# Patient Record
Sex: Female | Born: 1970 | Race: White | Hispanic: No | Marital: Married | Smoking: Current every day smoker
Health system: Southern US, Community
[De-identification: ages and names within clinical notes are randomized; demographics above are authoritative.]

## PROBLEM LIST (undated history)

## (undated) DIAGNOSIS — K529 Noninfective gastroenteritis and colitis, unspecified: Secondary | ICD-10-CM

## (undated) DIAGNOSIS — N281 Cyst of kidney, acquired: Secondary | ICD-10-CM

## (undated) DIAGNOSIS — Z9981 Dependence on supplemental oxygen: Secondary | ICD-10-CM

## (undated) DIAGNOSIS — N2889 Other specified disorders of kidney and ureter: Secondary | ICD-10-CM

## (undated) DIAGNOSIS — R0600 Dyspnea, unspecified: Secondary | ICD-10-CM

## (undated) DIAGNOSIS — T560X1A Toxic effect of lead and its compounds, accidental (unintentional), initial encounter: Secondary | ICD-10-CM

## (undated) DIAGNOSIS — Z9889 Other specified postprocedural states: Secondary | ICD-10-CM

## (undated) DIAGNOSIS — M549 Dorsalgia, unspecified: Secondary | ICD-10-CM

## (undated) DIAGNOSIS — R112 Nausea with vomiting, unspecified: Secondary | ICD-10-CM

## (undated) DIAGNOSIS — K5900 Constipation, unspecified: Secondary | ICD-10-CM

## (undated) DIAGNOSIS — F419 Anxiety disorder, unspecified: Secondary | ICD-10-CM

## (undated) DIAGNOSIS — K219 Gastro-esophageal reflux disease without esophagitis: Secondary | ICD-10-CM

## (undated) DIAGNOSIS — J189 Pneumonia, unspecified organism: Secondary | ICD-10-CM

## (undated) DIAGNOSIS — G8929 Other chronic pain: Secondary | ICD-10-CM

## (undated) DIAGNOSIS — I10 Essential (primary) hypertension: Secondary | ICD-10-CM

## (undated) DIAGNOSIS — G4700 Insomnia, unspecified: Secondary | ICD-10-CM

## (undated) DIAGNOSIS — M1990 Unspecified osteoarthritis, unspecified site: Secondary | ICD-10-CM

## (undated) DIAGNOSIS — C55 Malignant neoplasm of uterus, part unspecified: Secondary | ICD-10-CM

## (undated) DIAGNOSIS — G43909 Migraine, unspecified, not intractable, without status migrainosus: Secondary | ICD-10-CM

## (undated) DIAGNOSIS — I509 Heart failure, unspecified: Secondary | ICD-10-CM

## (undated) DIAGNOSIS — F329 Major depressive disorder, single episode, unspecified: Secondary | ICD-10-CM

## (undated) DIAGNOSIS — F4310 Post-traumatic stress disorder, unspecified: Secondary | ICD-10-CM

## (undated) HISTORY — PX: HAND SURGERY: SHX662

## (undated) HISTORY — PX: ABDOMINAL HYSTERECTOMY: SHX81

## (undated) HISTORY — PX: CHOLECYSTECTOMY: SHX55

## (undated) HISTORY — PX: BACK SURGERY: SHX140

## (undated) HISTORY — DX: Other chronic pain: G89.29

## (undated) HISTORY — DX: Constipation, unspecified: K59.00

## (undated) HISTORY — DX: Toxic effect of lead and its compounds, accidental (unintentional), initial encounter: T56.0X1A

## (undated) HISTORY — DX: Gastro-esophageal reflux disease without esophagitis: K21.9

## (undated) HISTORY — DX: Malignant neoplasm of uterus, part unspecified: C55

## (undated) HISTORY — DX: Other specified disorders of kidney and ureter: N28.89

---

## 2003-10-03 ENCOUNTER — Emergency Department (HOSPITAL_COMMUNITY): Admission: EM | Admit: 2003-10-03 | Discharge: 2003-10-04 | Payer: Self-pay | Admitting: *Deleted

## 2004-02-14 ENCOUNTER — Emergency Department (HOSPITAL_COMMUNITY): Admission: EM | Admit: 2004-02-14 | Discharge: 2004-02-14 | Payer: Self-pay | Admitting: *Deleted

## 2004-02-14 IMAGING — CR DG ABDOMEN ACUTE W/ 1V CHEST
3 series · 3 of 3 positions shown · non-contrast
Comparison: none

CLINICAL DATA: Abdominal pain. 
 ACUTE ABDOMEN SERIES WITH CHEST ? [DATE]
 The lungs are clear.  The cardiac and mediastinal contours are normal.   Nonobstructive bowel gas pattern.  Extensive inspissated fecal material seen within the colon.  I suspect this reflects an element of constipation.
 IMPRESSION
 1.  Nonobstructive bowel gas pattern with probable element of constipation.
 2.  Lungs clear.

[view not recorded (1 of 3)]
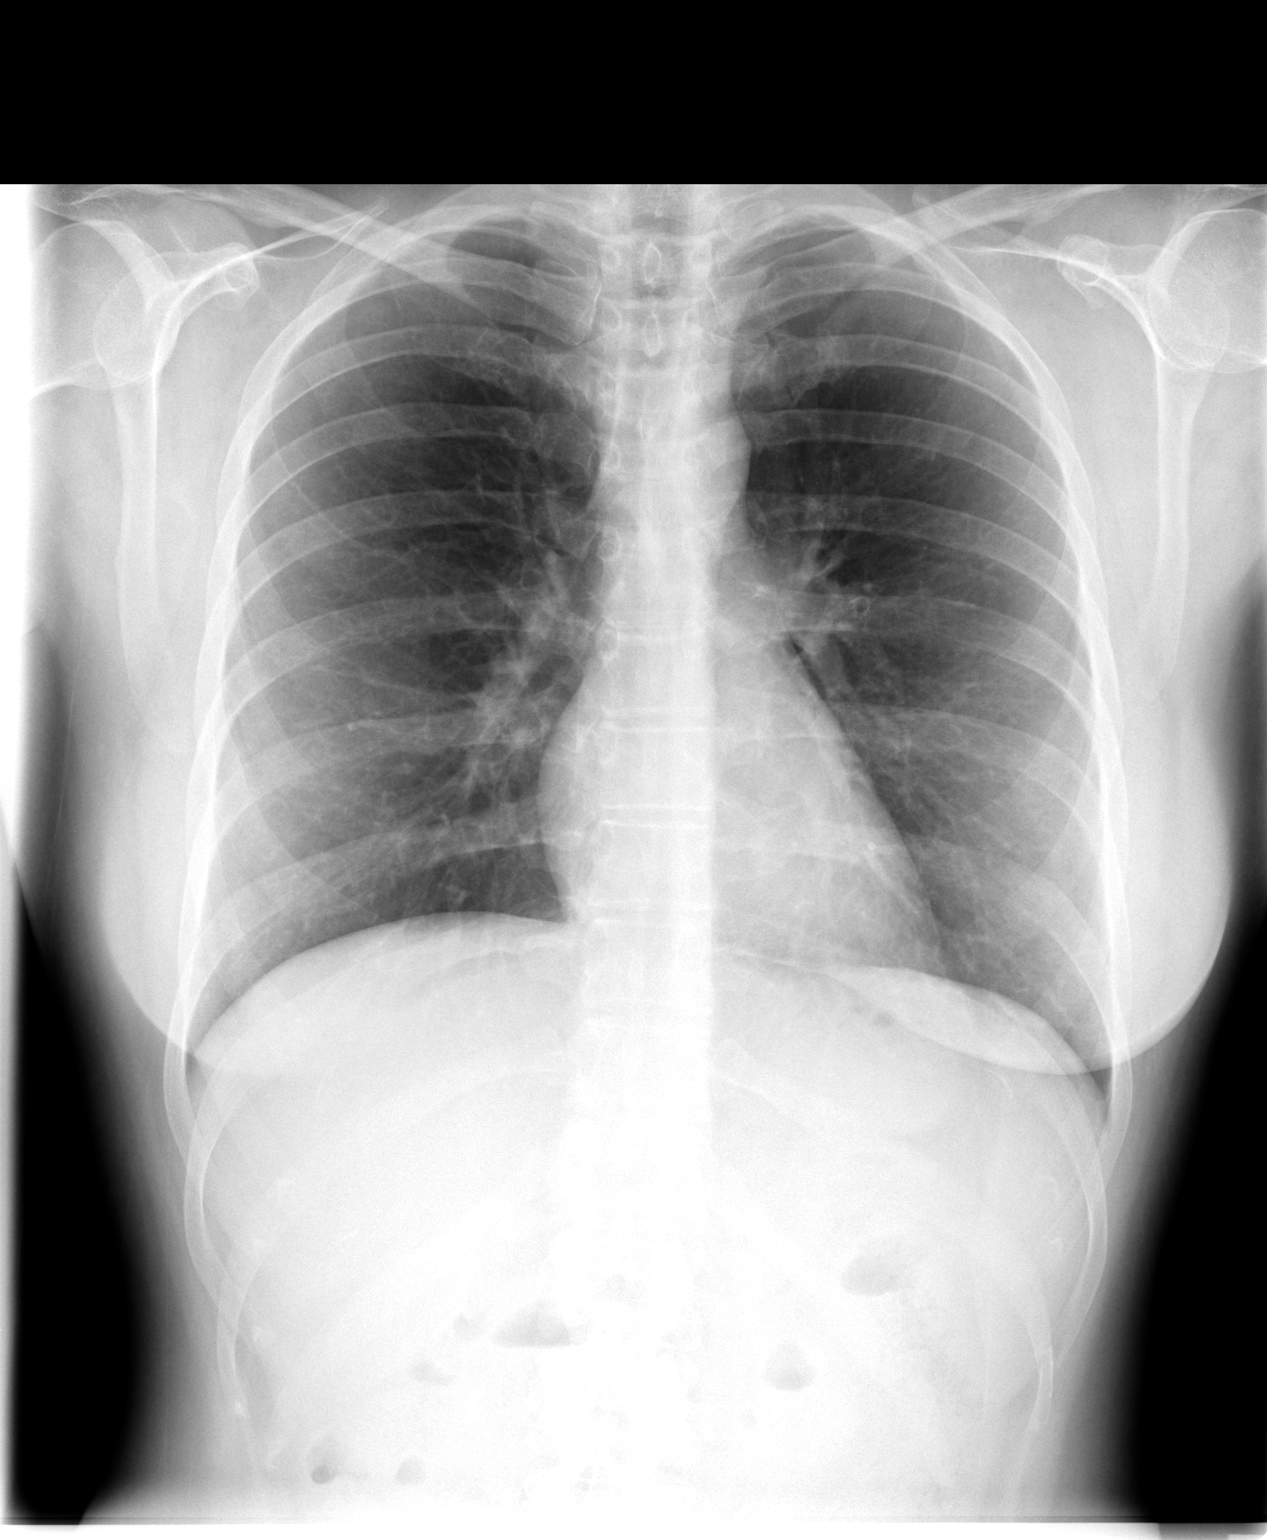

[view not recorded (2 of 3)]
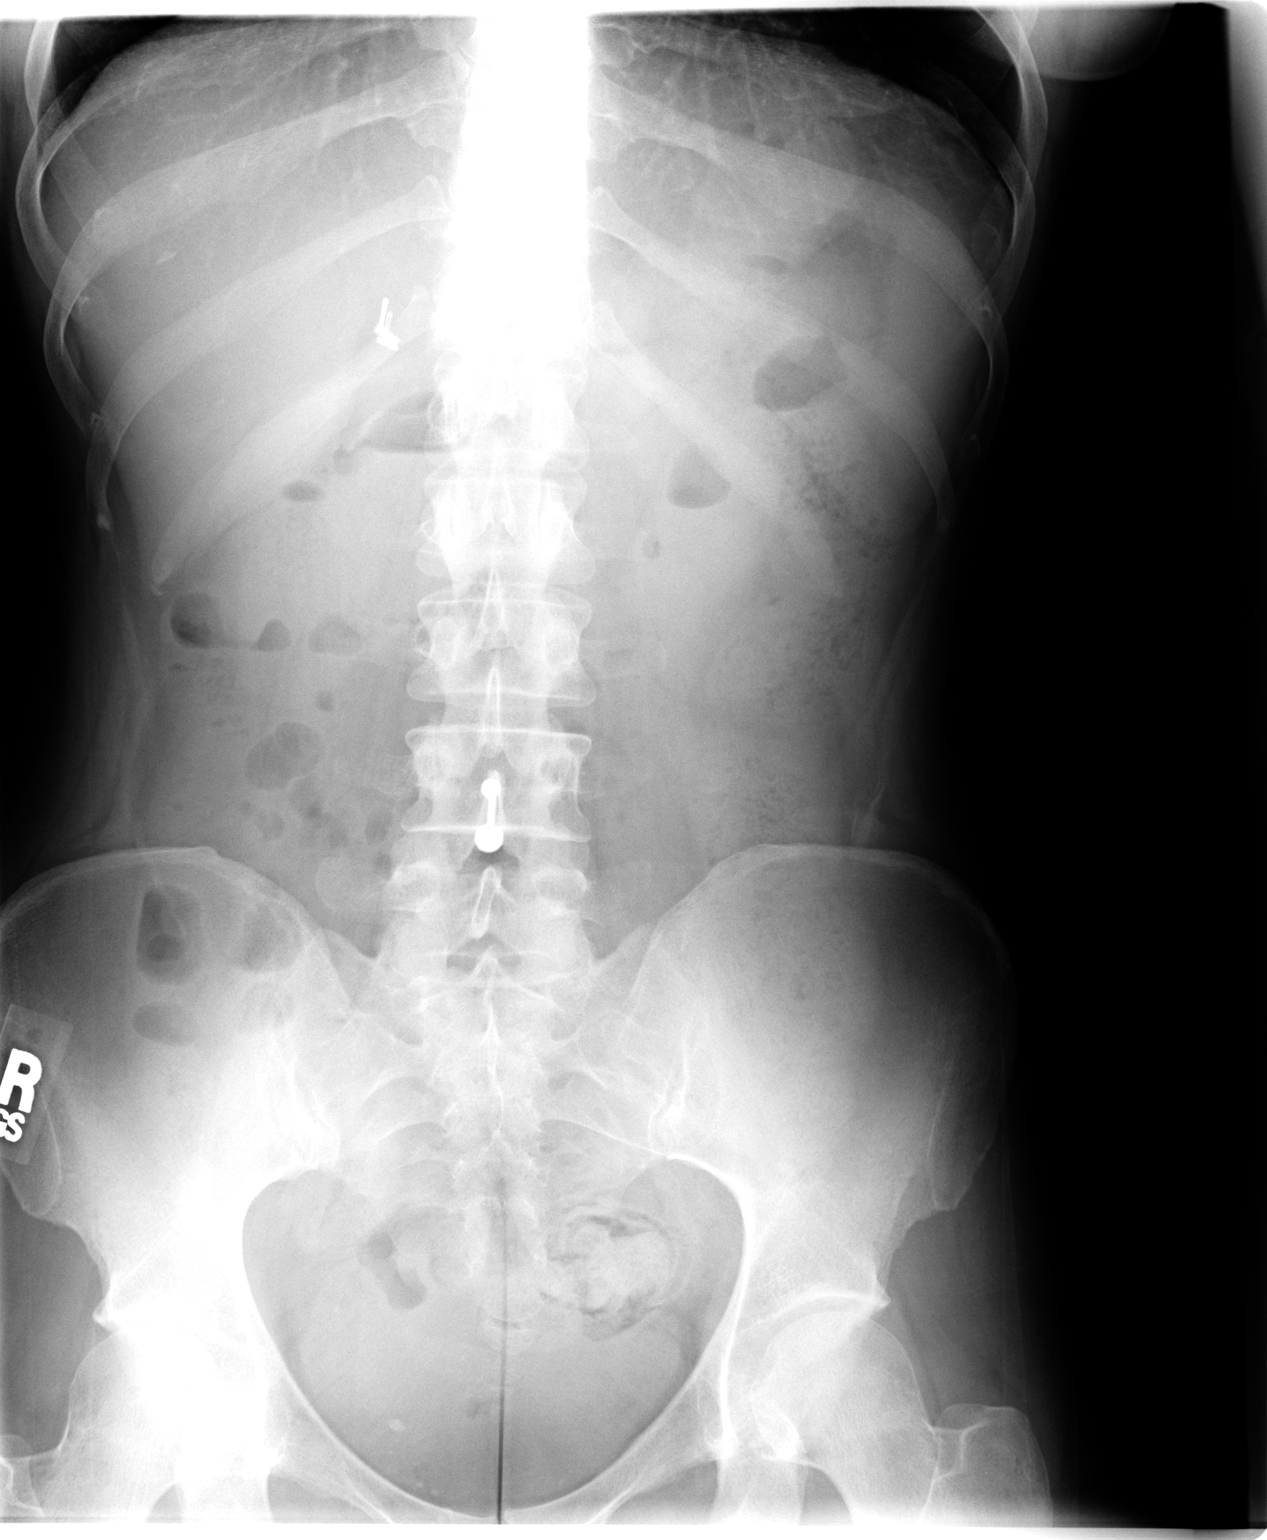

[view not recorded (3 of 3)]
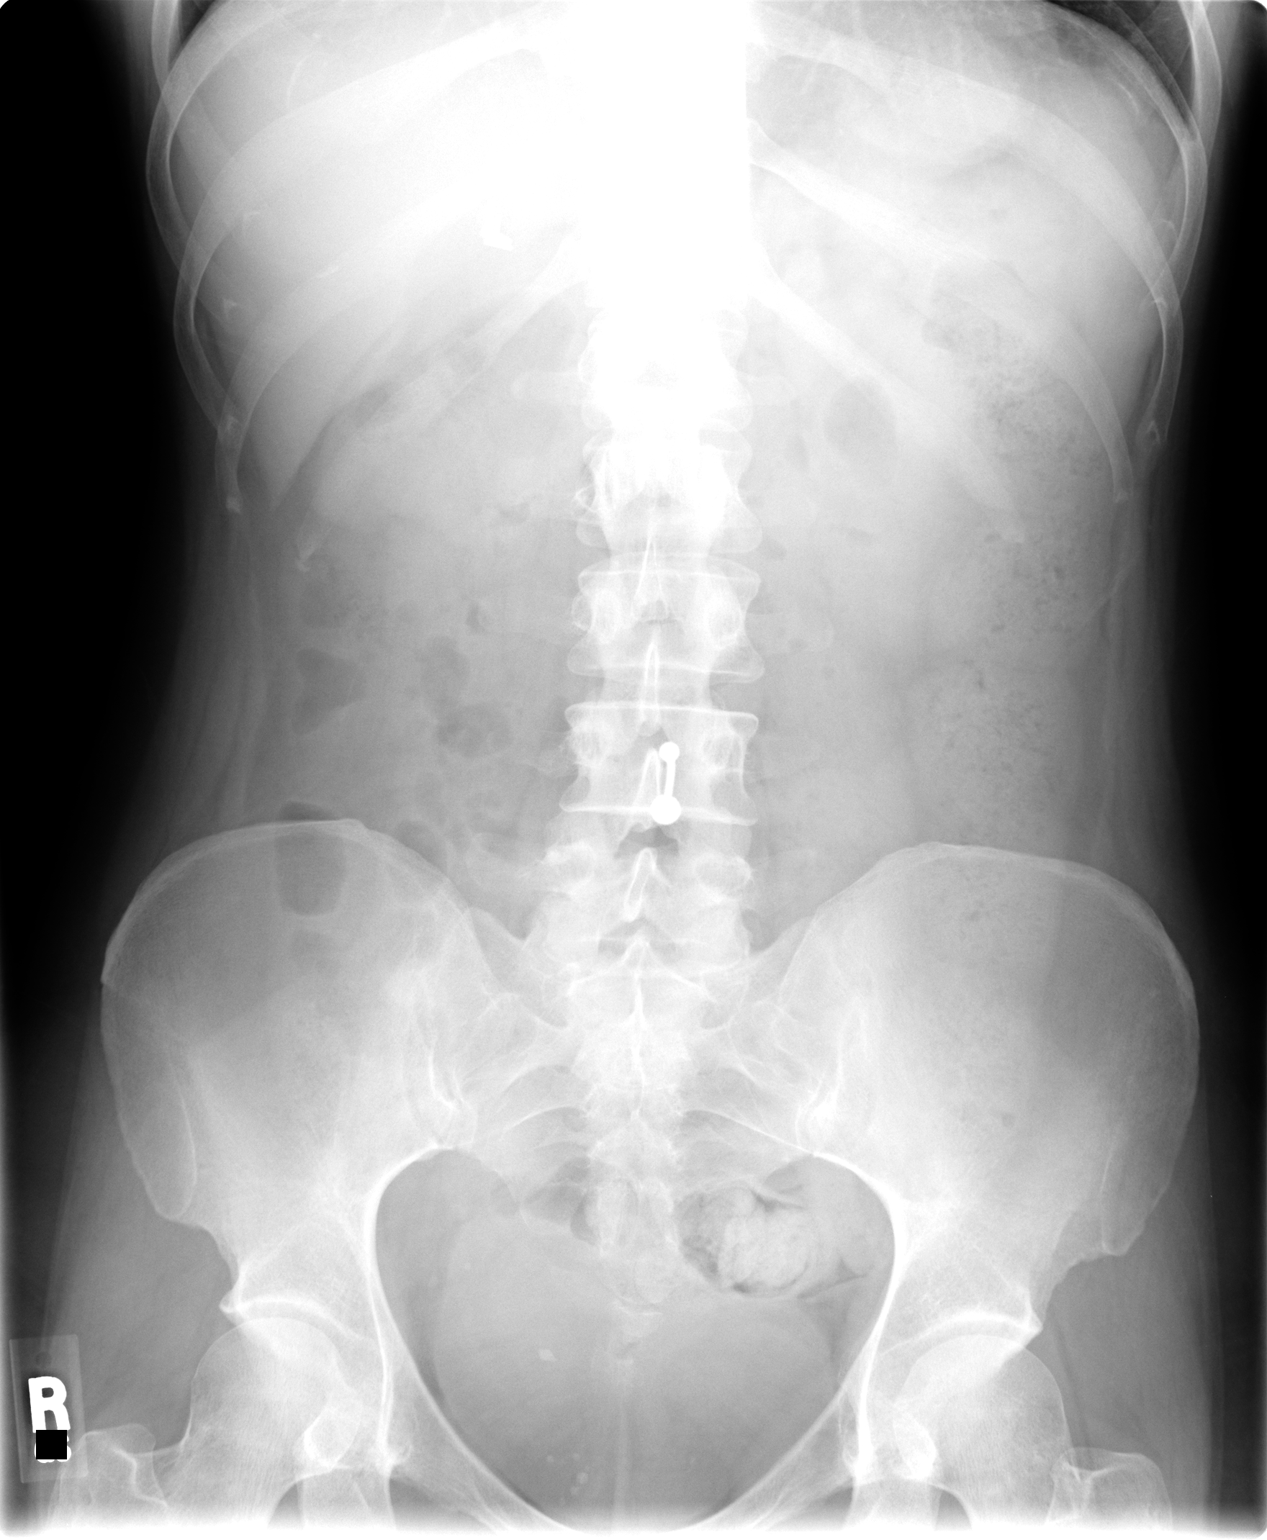

[3 of 3 positions shown; findings below may reference images not displayed]

## 2004-07-24 ENCOUNTER — Observation Stay (HOSPITAL_COMMUNITY): Admission: EM | Admit: 2004-07-24 | Discharge: 2004-07-25 | Payer: Self-pay | Admitting: *Deleted

## 2004-07-24 IMAGING — CR DG ABDOMEN ACUTE W/ 1V CHEST
3 series · 3 of 3 positions shown · non-contrast
Comparison: [DATE].

CLINICAL DATA: Abdominal pain.   Vomiting.
THREE-VIEW ACUTE ABDOMEN SERIES WITH CHEST:

[view not recorded (1 of 3)]
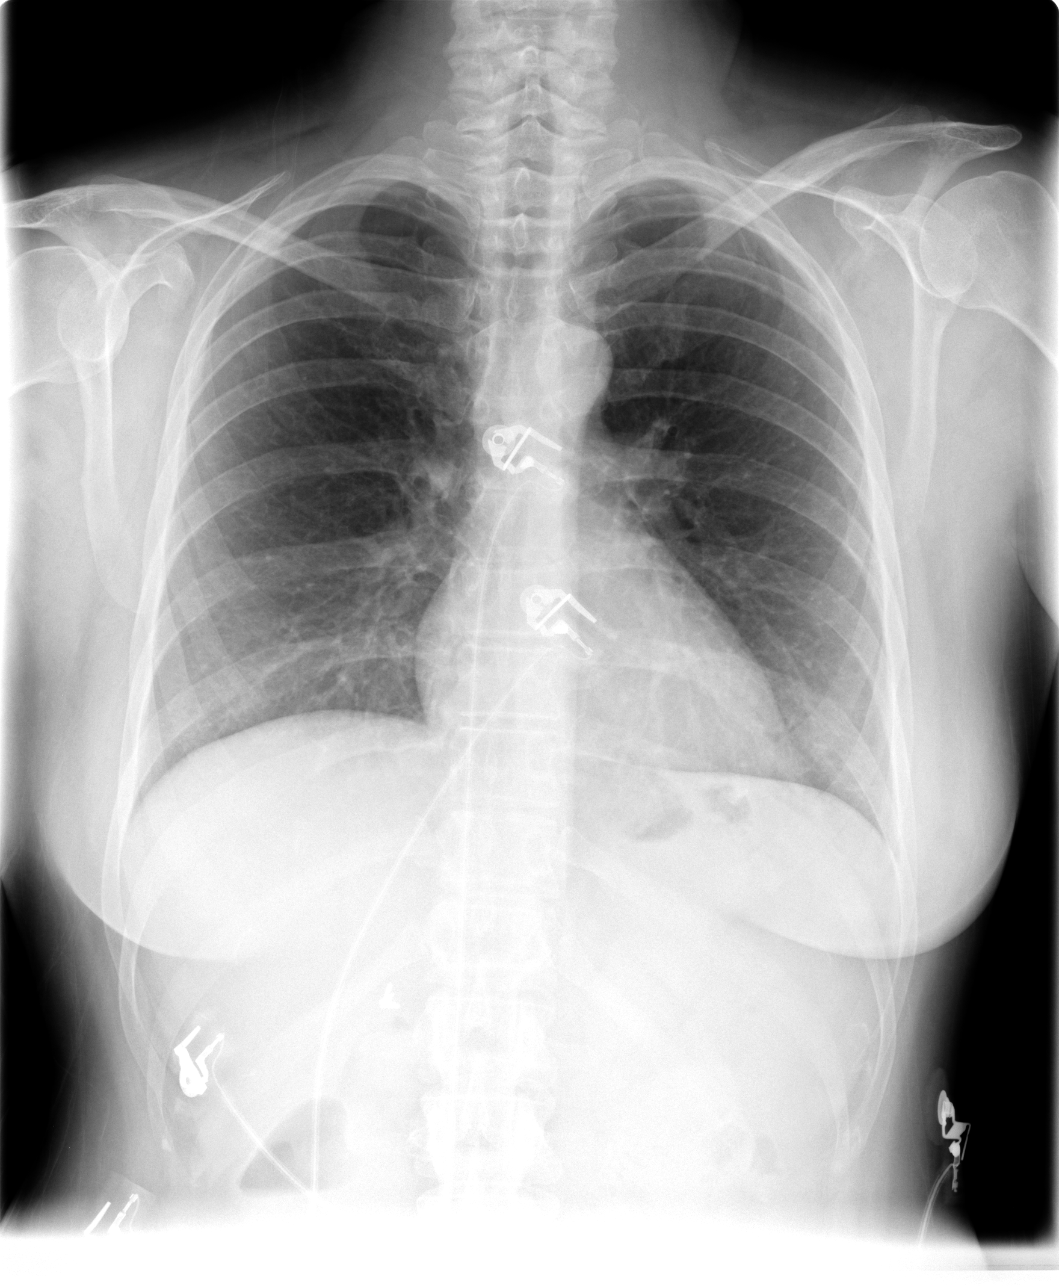

[view not recorded (2 of 3)]
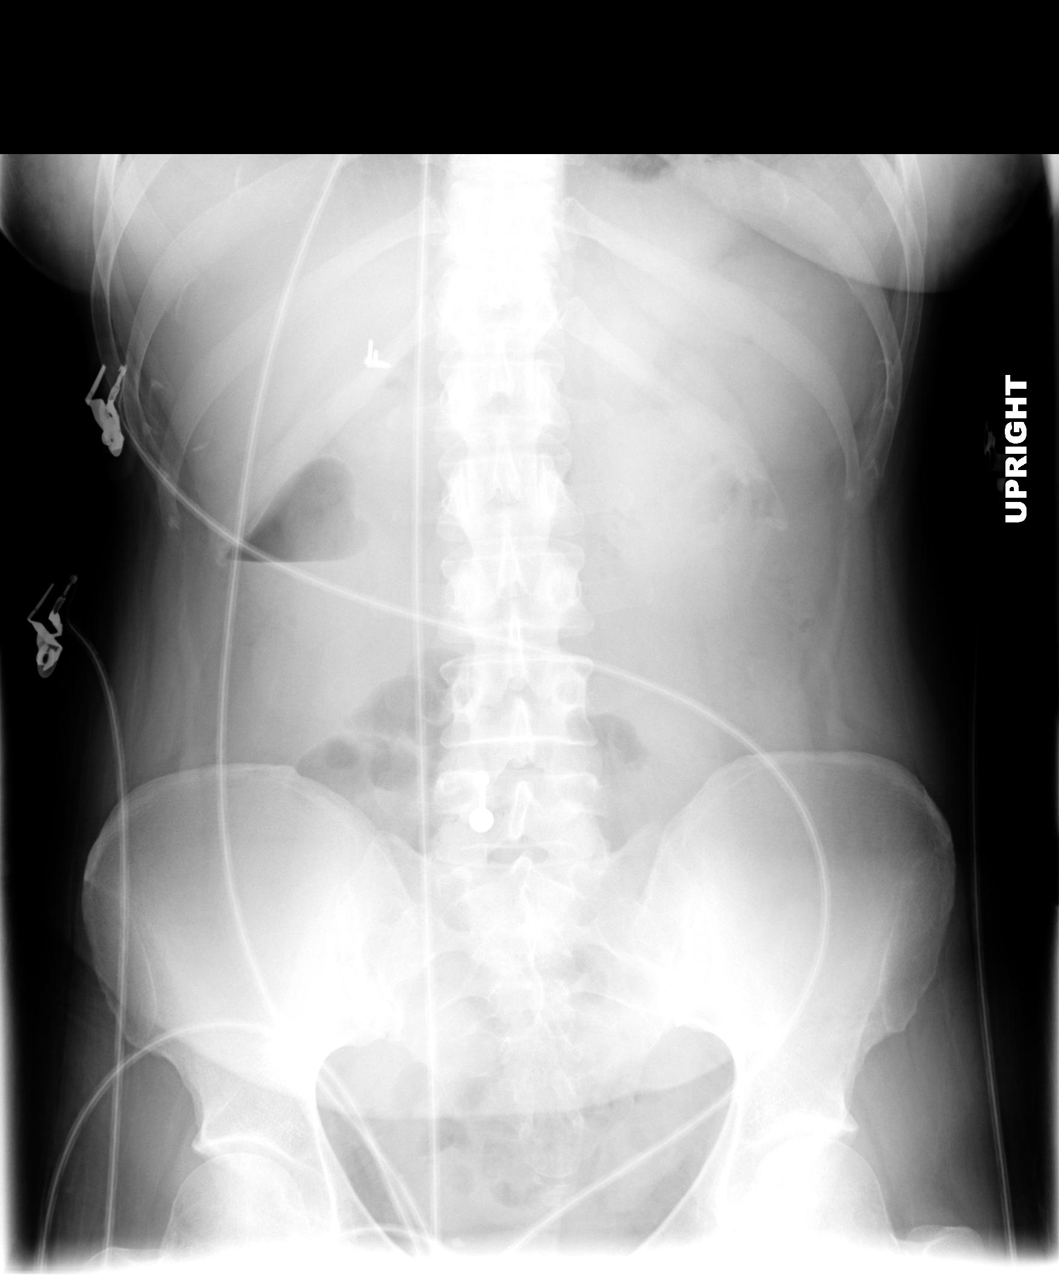

[view not recorded (3 of 3)]
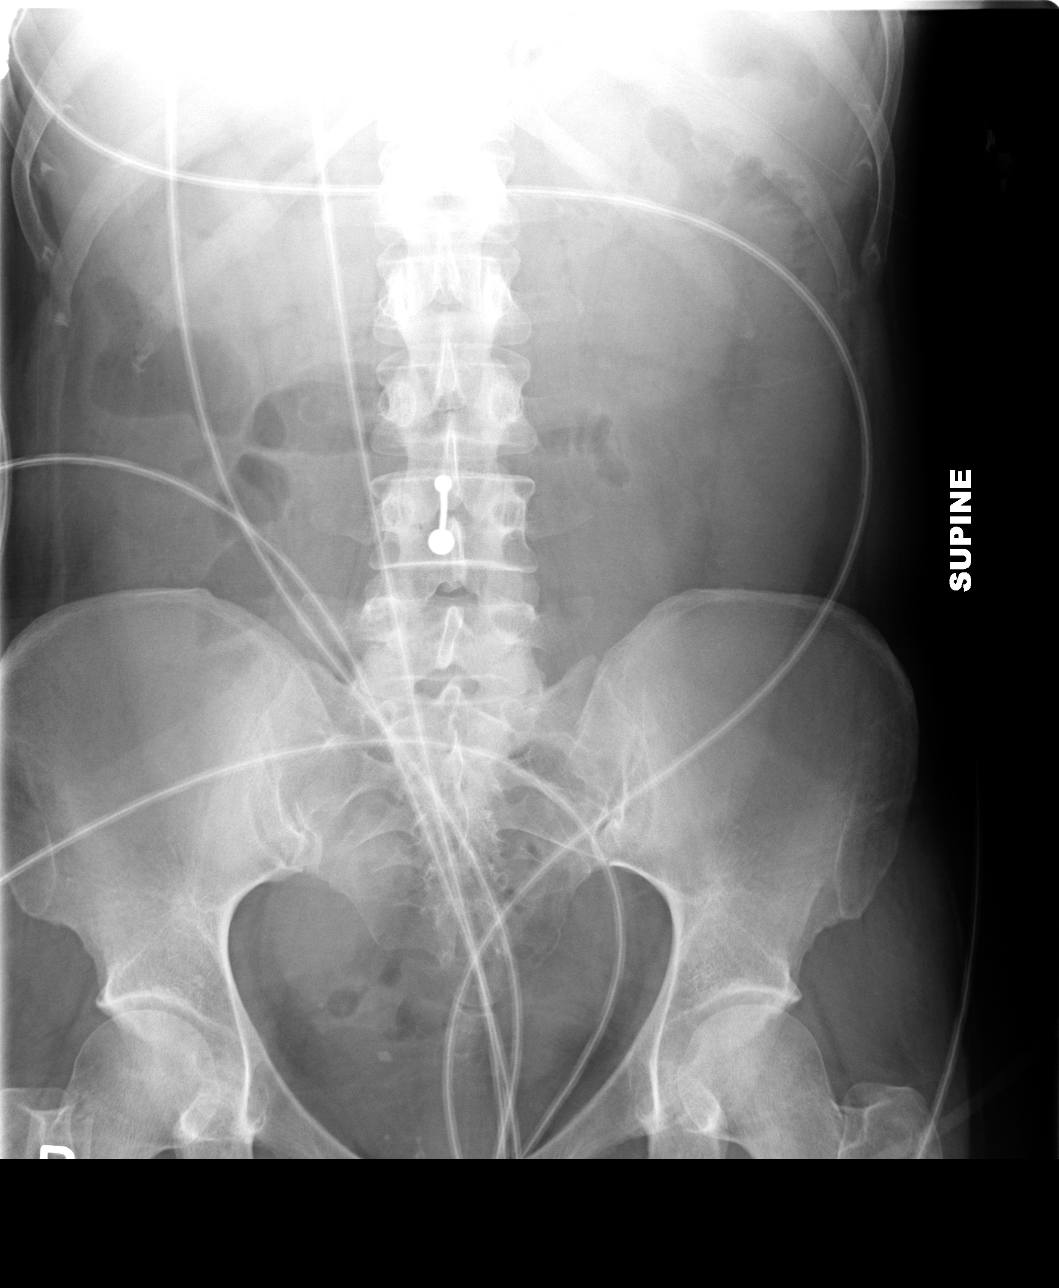

[3 of 3 positions shown; findings below may reference images not displayed]

Mild peribronchial thickening is stable.  The cardiomediastinal silhouette is unchanged.  Evidence of cholecystectomy noted.  The bowel gas pattern is nonspecific without evidence of bowel obstruction or pneumoperitoneum.  The bony structures are unremarkable.
IMPRESSION: 1.  Nonspecific bowel gas pattern without obstruction or pneumoperitoneum.  
2.  Stable mild peribronchial thickening.

## 2004-07-25 IMAGING — CT CT ABDOMEN W/ CM
1 of 3 series · 14 of 32 positions shown, 19 images · IV contrast (CONTRAST)
Comparison: None.

CLINICAL DATA: Midabdominal pain with nausea and vomiting.
TECHNIQUE: Routine multidetector helical imaging with oral and IV contrast - 100 cc Omnipaque 300.

[Series 2857: — · axial · 0.68mm/px · z∈[+1334,+1708]mm · 14 of 87 slices shown, 19 images]
[im 6/87  soft-tissue]
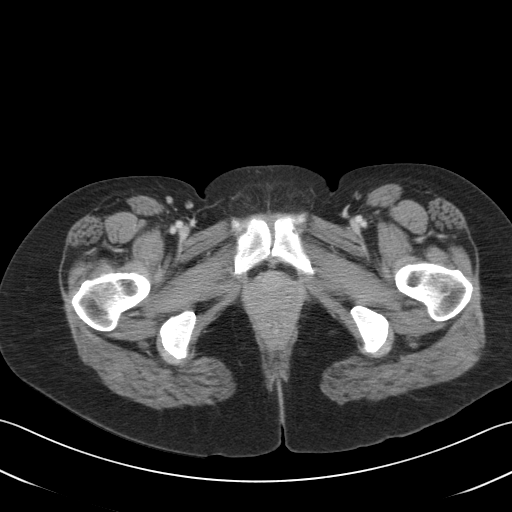
[im 6/87  bone]
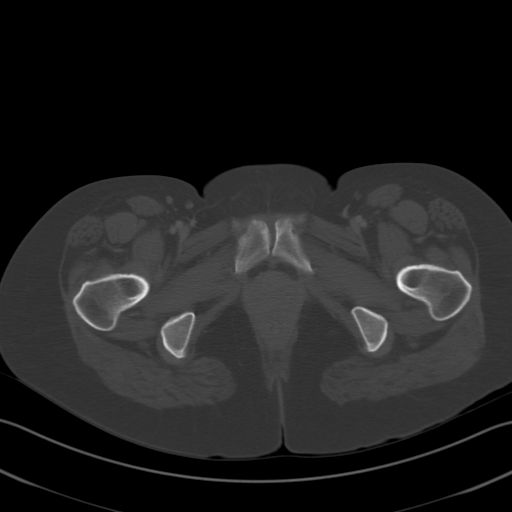
[im 11/87  soft-tissue]
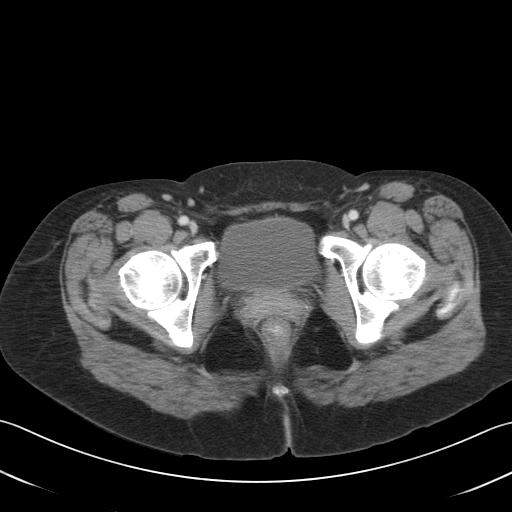
[im 21/87  soft-tissue]
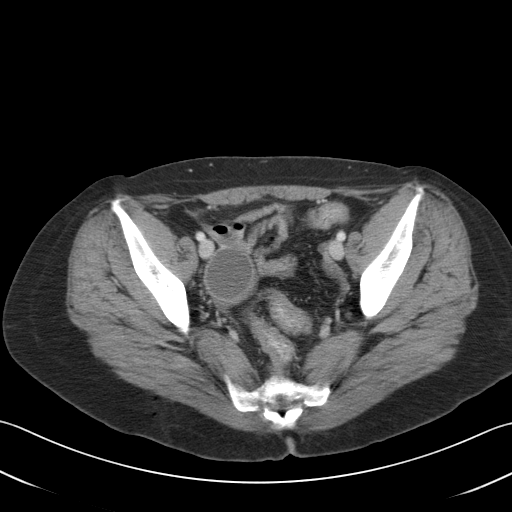
[im 26/87  soft-tissue]
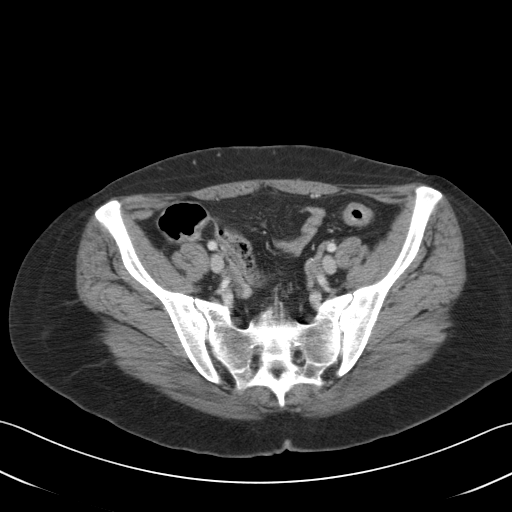
[im 31/87  soft-tissue]
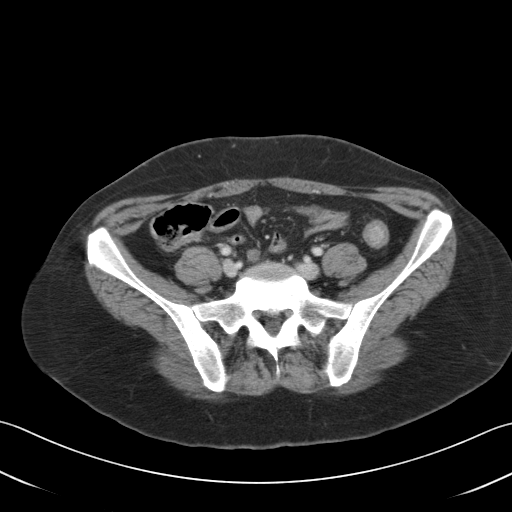
[im 36/87  soft-tissue]
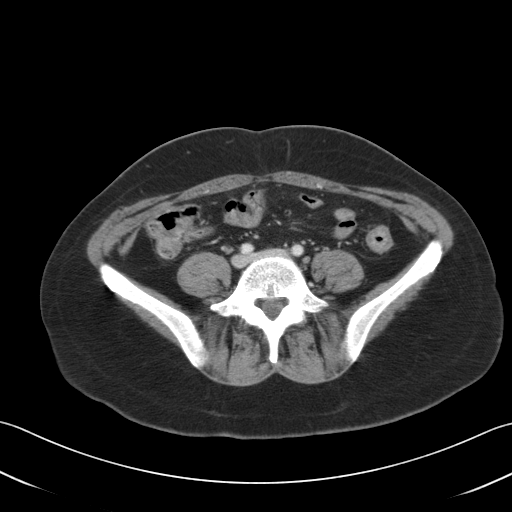
[im 46/87  soft-tissue]
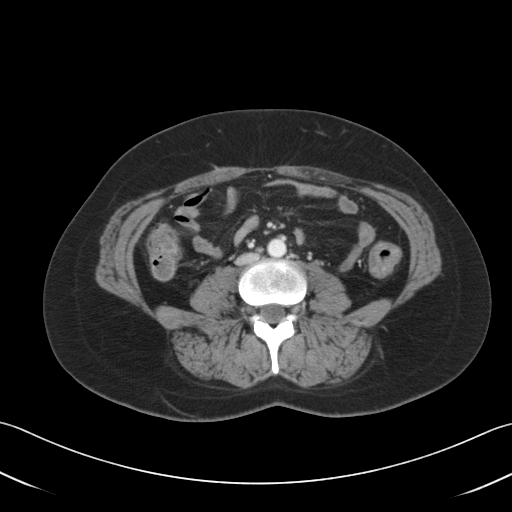
[im 51/87  soft-tissue]
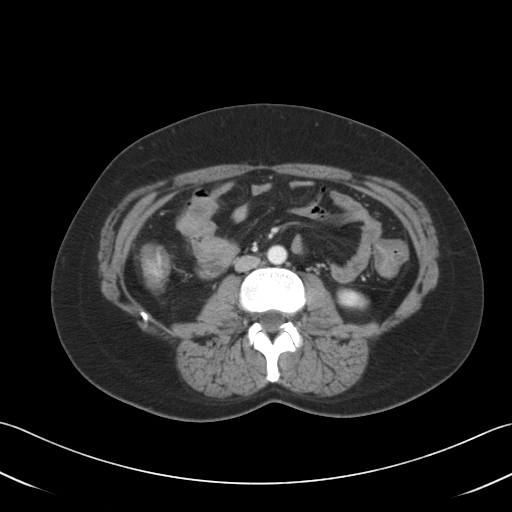
[im 56/87  soft-tissue]
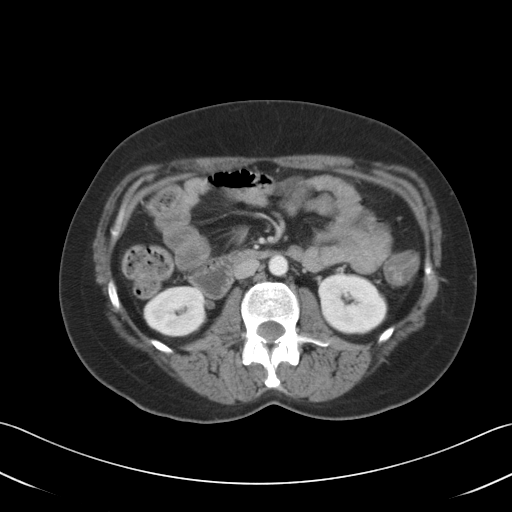
[im 56/87  bone]
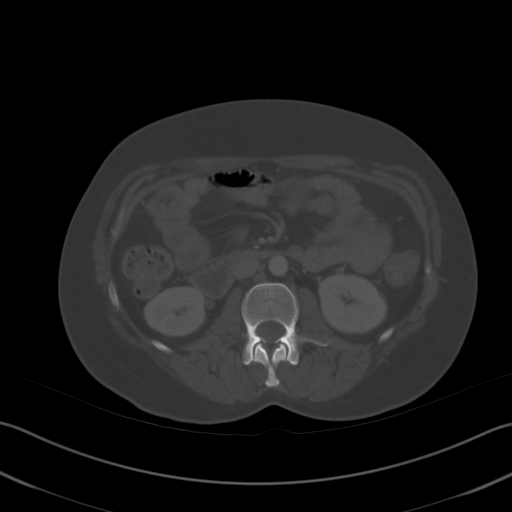
[im 61/87  soft-tissue]
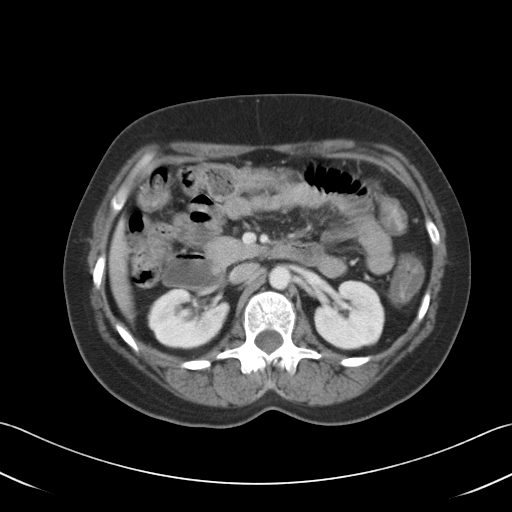
[im 66/87  soft-tissue]
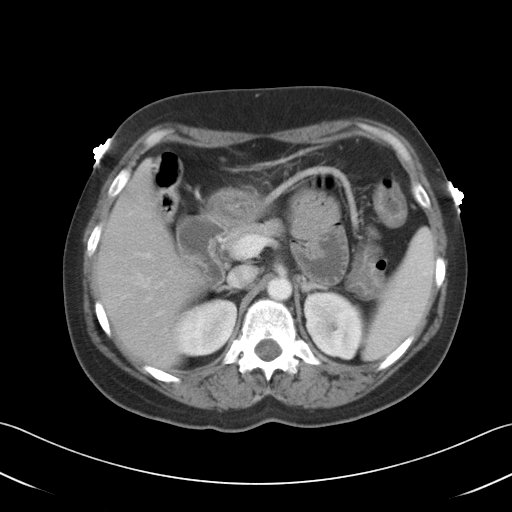
[im 66/87  lung]
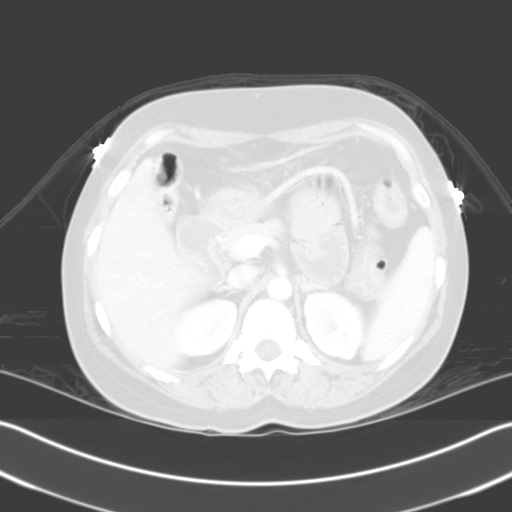
[im 71/87  lung]
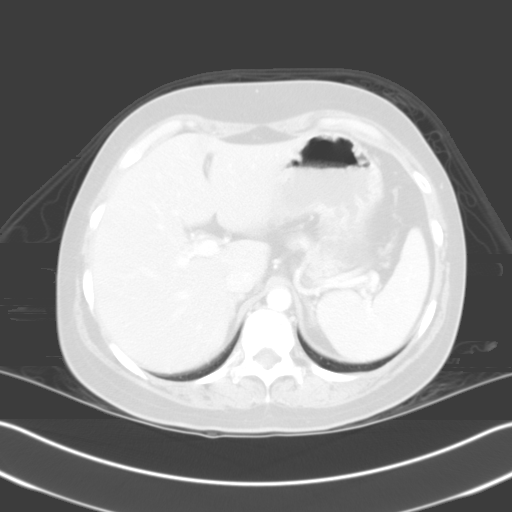
[im 76/87  soft-tissue]
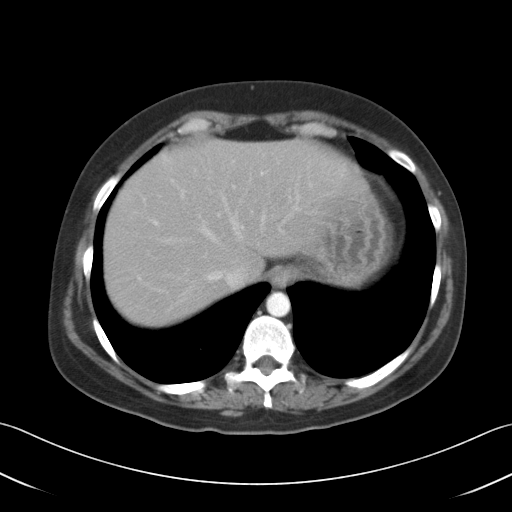
[im 76/87  lung]
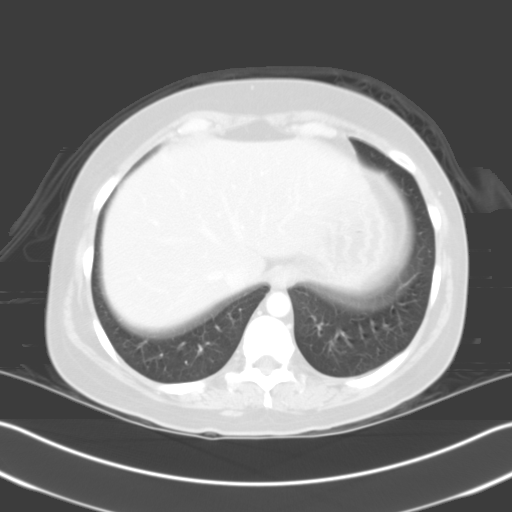
[im 81/87  soft-tissue]
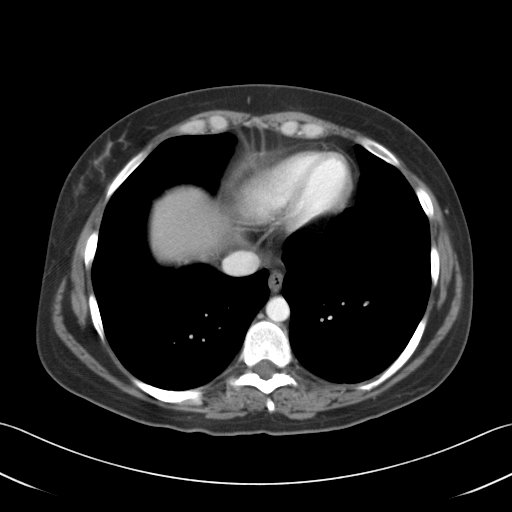
[im 81/87  lung]
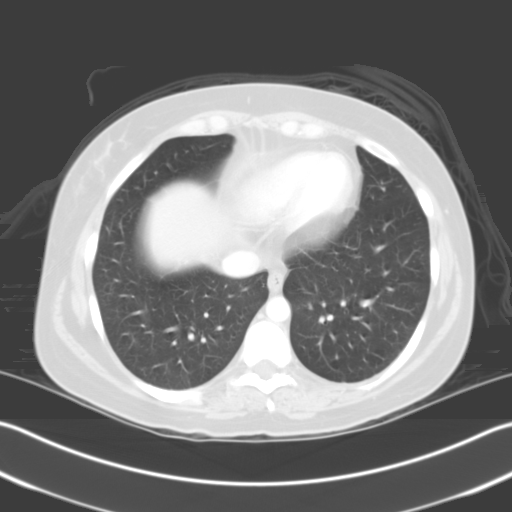

[14 of 32 positions shown; findings below may reference images not displayed]

CT ABDOMEN WITH CONTRAST:
 Lung bases are clear.  Liver, spleen, pancreas, and adrenals are normal.  Prior cholecystectomy.  No bile duct dilatation.  Mild dilatation of proximal small bowel loops without significant air fluid levels.  No adenopathy or ascites.
IMPRESSION: 1.  Mild dilatation of proximal small bowel loops without obstruction. 
 2.  No other acute or specific findings. 
 3.  Status post cholecystectomy. 
 CT PELVIS WITH CONTRAST:
 There is a 4-cm simple cyst in the right adnexa, most consistent with an ovarian cyst.  The patient appears to have had a partial hysterectomy.  No focal masses, adenopathy, or fluid collections.  Appendix normal.
IMPRESSION: Unremarkable, except for a 4-cm ovarian cyst.

## 2004-09-04 ENCOUNTER — Ambulatory Visit: Payer: Self-pay | Admitting: Internal Medicine

## 2004-09-15 ENCOUNTER — Ambulatory Visit (HOSPITAL_COMMUNITY): Admission: RE | Admit: 2004-09-15 | Discharge: 2004-09-15 | Payer: Self-pay | Admitting: Internal Medicine

## 2004-09-15 IMAGING — RF DG UGI W/ SMALL BOWEL HIGH DENSITY
14 of 24 series · 14 of 24 positions shown · non-contrast
Comparison: none

CLINICAL DATA: 33 year-old, nausea, vomiting, diarrhea.  Abdominal pain.
 UPPER GI AND SMALL BOWEL FOLLOW THROUGH:
 Single contrast upper GI study was performed with initial barium swallow demonstrating normal esophageal motility.  No intrinsic or extrinsic lesions of the esophagus and no mucosal abnormalities.  The mucosal folds appear normal.  There is a small sliding type hiatal hernia.  No gastroesophageal reflux is demonstrated.
 The stomach, duodenal bulb and C-loop are normal in appearance.

[Series 1: run · 1 of 1 slices shown (1 of 14)]
[im 1/1]
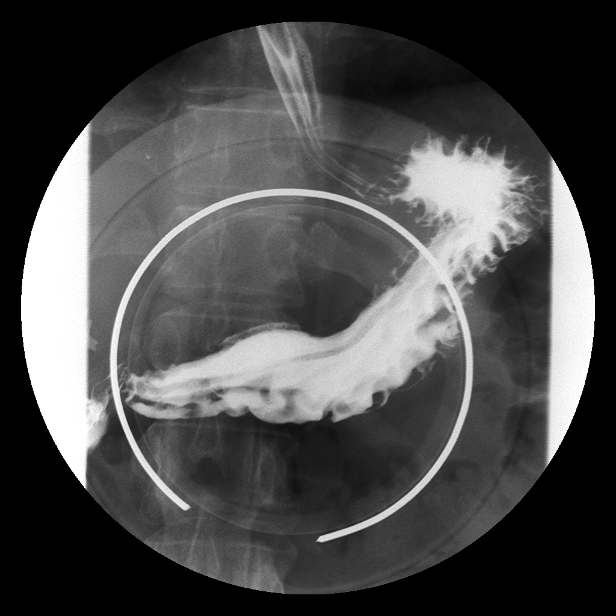

[Series 3: run · 1 of 1 slices shown (2 of 14)]
[im 1/1]
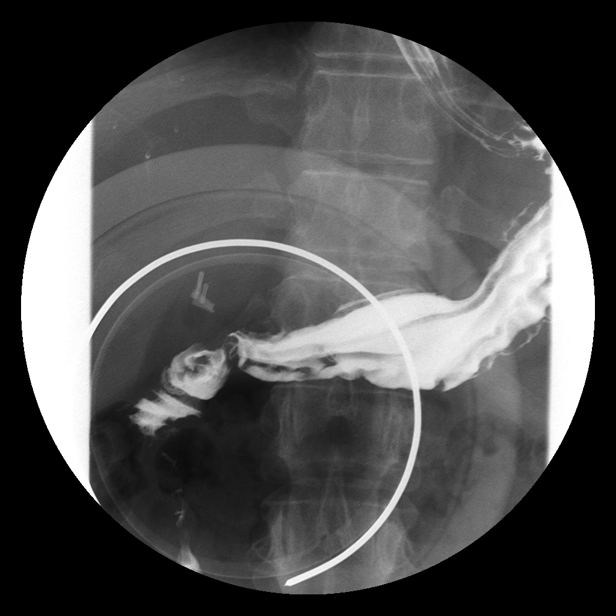

[Series 5: run · 1 of 1 slices shown (3 of 14)]
[im 1/1]
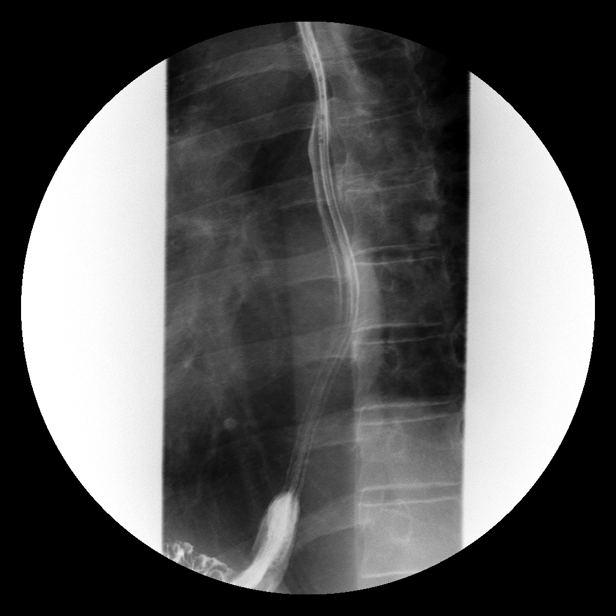

[Series 7: run · 1 of 1 slices shown (4 of 14)]
[im 1/1]
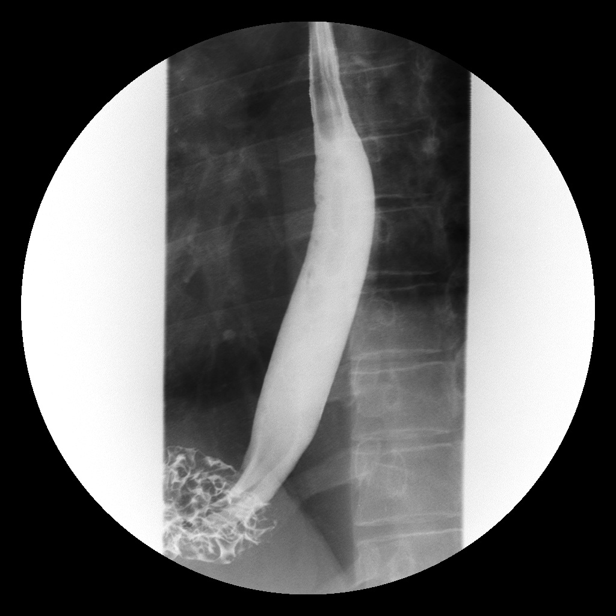

[Series 8: run · 1 of 1 slices shown (5 of 14)]
[im 1/1]
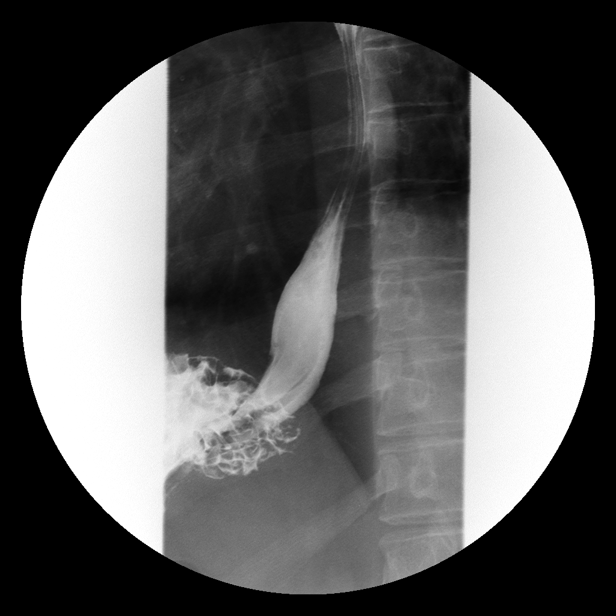

[Series 10: run · 1 of 1 slices shown (6 of 14)]
[im 1/1]
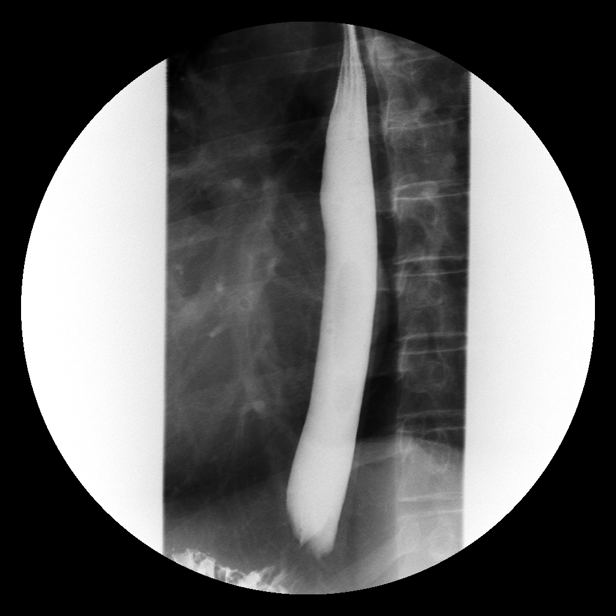

[Series 12: run · 1 of 1 slices shown (7 of 14)]
[im 1/1]
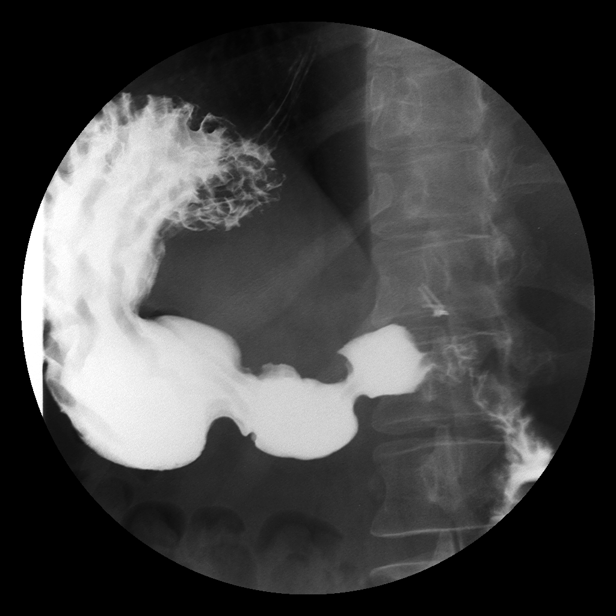

[Series 13: run · 1 of 1 slices shown (8 of 14)]
[im 1/1]
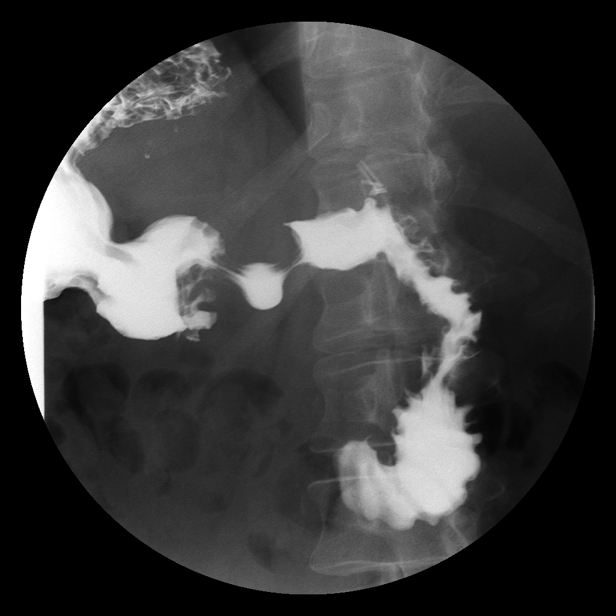

[Series 15: run · 1 of 1 slices shown (9 of 14)]
[im 1/1]
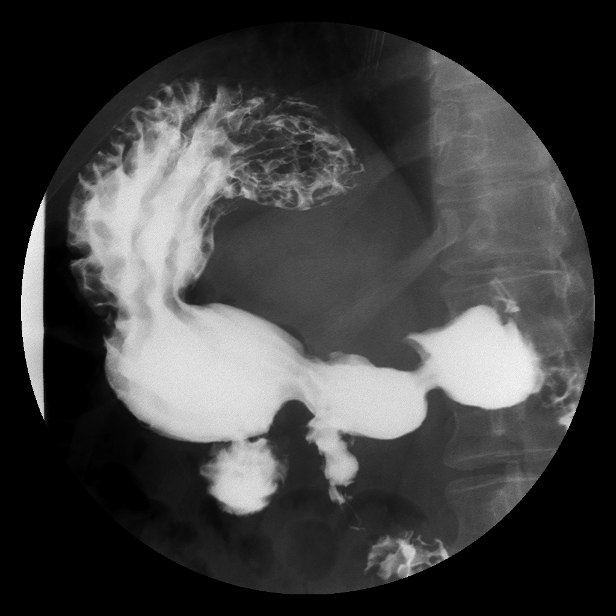

[Series 17: run · 1 of 1 slices shown (10 of 14)]
[im 1/1]
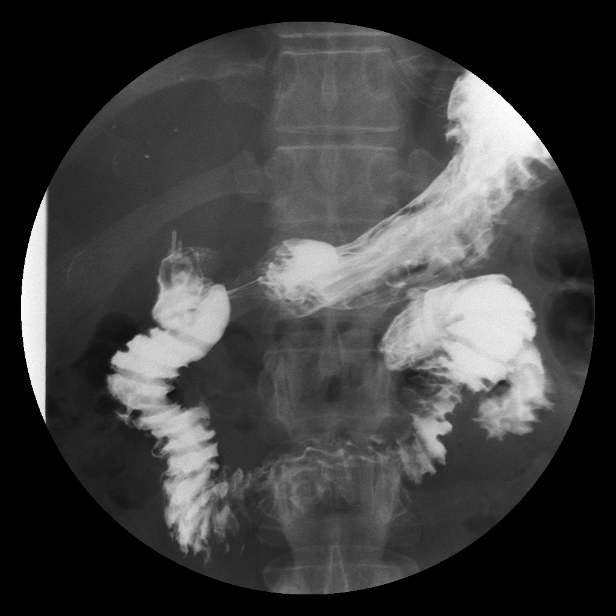

[Series 19: run · 1 of 1 slices shown (11 of 14)]
[im 1/1]
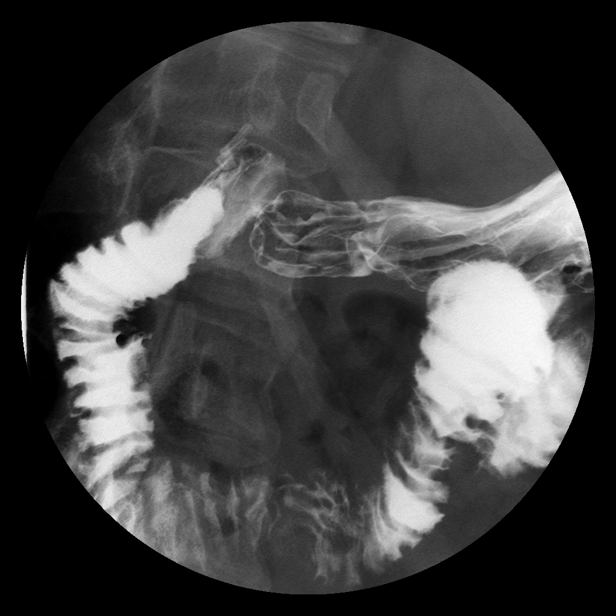

[Series 20: run · 1 of 1 slices shown (12 of 14)]
[im 1/1]
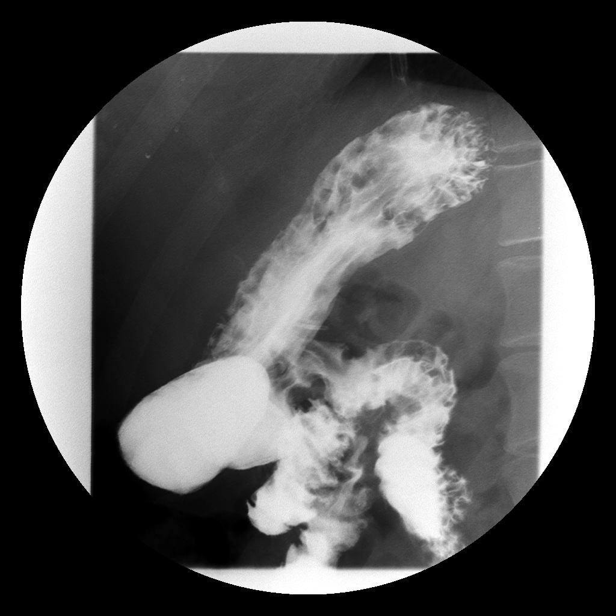

[Series 22: run · 1 of 1 slices shown (13 of 14)]
[im 1/1]
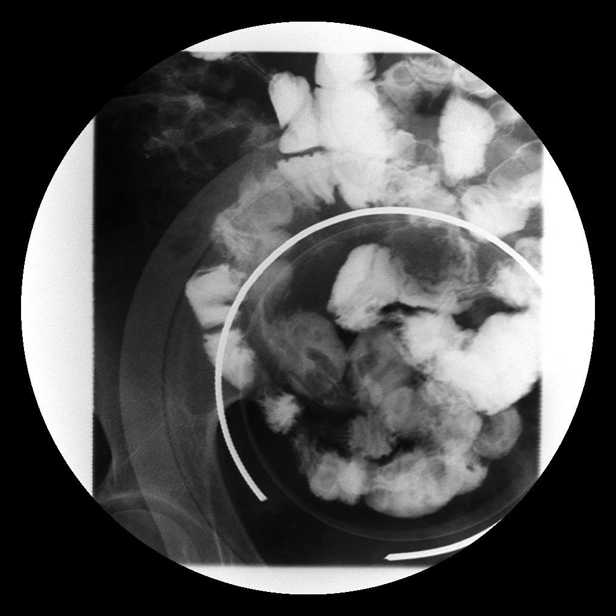

[Series 24: run · 1 of 1 slices shown (14 of 14)]
[im 1/1]
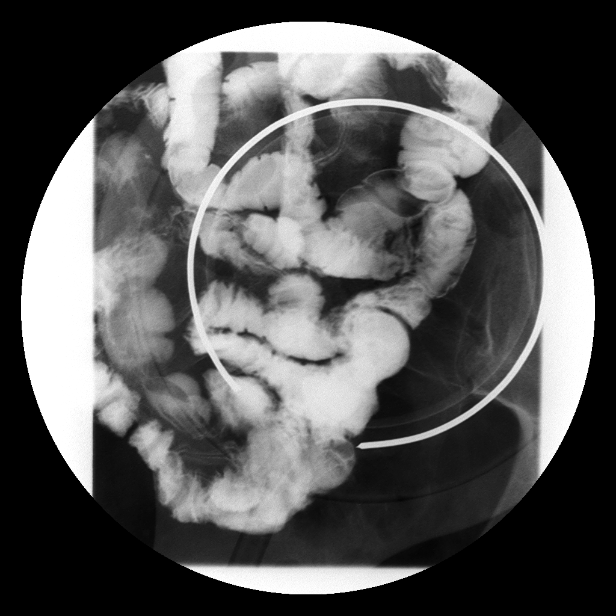

[14 of 24 positions shown; findings below may reference images not displayed]

IMPRESSION: Unremarkable upper GI study.  Small sliding type hiatal hernia.
 SMALL BOWEL FOLLOW THROUGH:
 Small bowel follow through examination is performed with overhead and spot views of the entire small bowel.  Normal mucosal folds in both the ileal and jejunal loops of small bowel.  No fixed filling defects, intrinsic or extrinsic lesions were seen.  There were some food particles noted in the ileum.  These were freely mobile.  The terminal ileum was well visualized and demonstrates no abnormalities. 
 In reviewing the patient?s CT scan, the descending colon is abnormal in appearance. There appears to be mucosal enhancement and mild wall thickening/submucosal edema.   Barium enema or colonoscopy may be helpful for evaluation particularly given this patient?s symptoms.
IMPRESSION: 1.  Unremarkable small bowel follow through examination.  
 2.  The CT scan from [DATE] demonstrates colonic wall thickening involving the descending colon.  Cannot exclude colitis. Further evaluation with barium enema or colonoscopy may be helpful.

## 2005-03-23 ENCOUNTER — Encounter: Payer: Self-pay | Admitting: Unknown Physician Specialty

## 2005-04-13 ENCOUNTER — Encounter: Payer: Self-pay | Admitting: Unknown Physician Specialty

## 2005-04-19 IMAGING — CR DG ABDOMEN 3V
1 series · 4 of 4 positions shown · non-contrast
Comparison: none

REASON FOR EXAM: Abdominal pain  rm 19
COMMENTS:

[Series 1: view not recorded · 0.17mm/px · 4 of 4 slices shown]
[im 1/4]
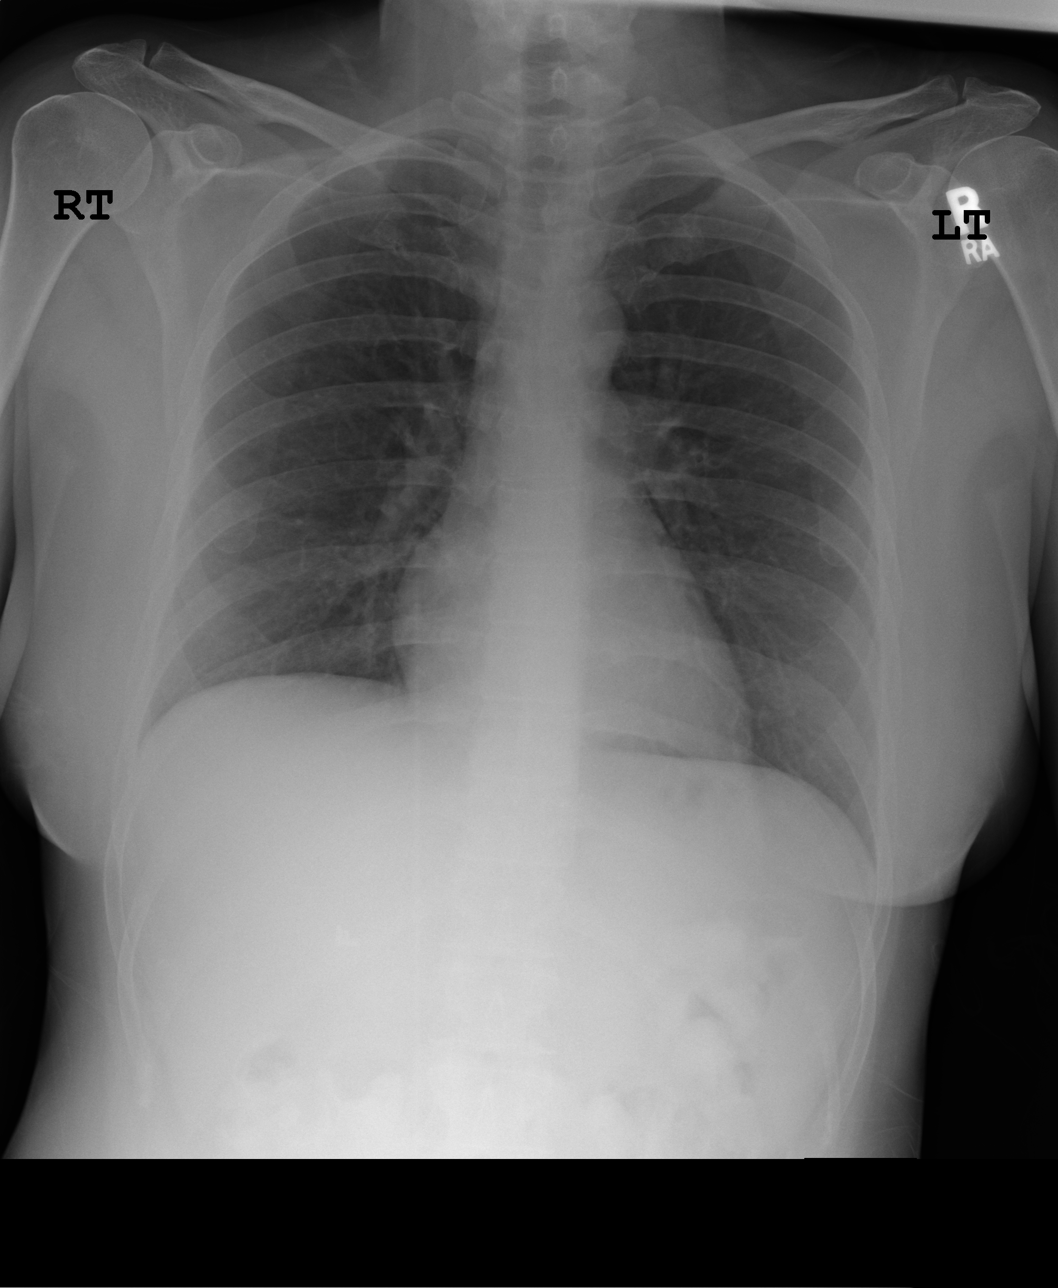
[im 2/4]
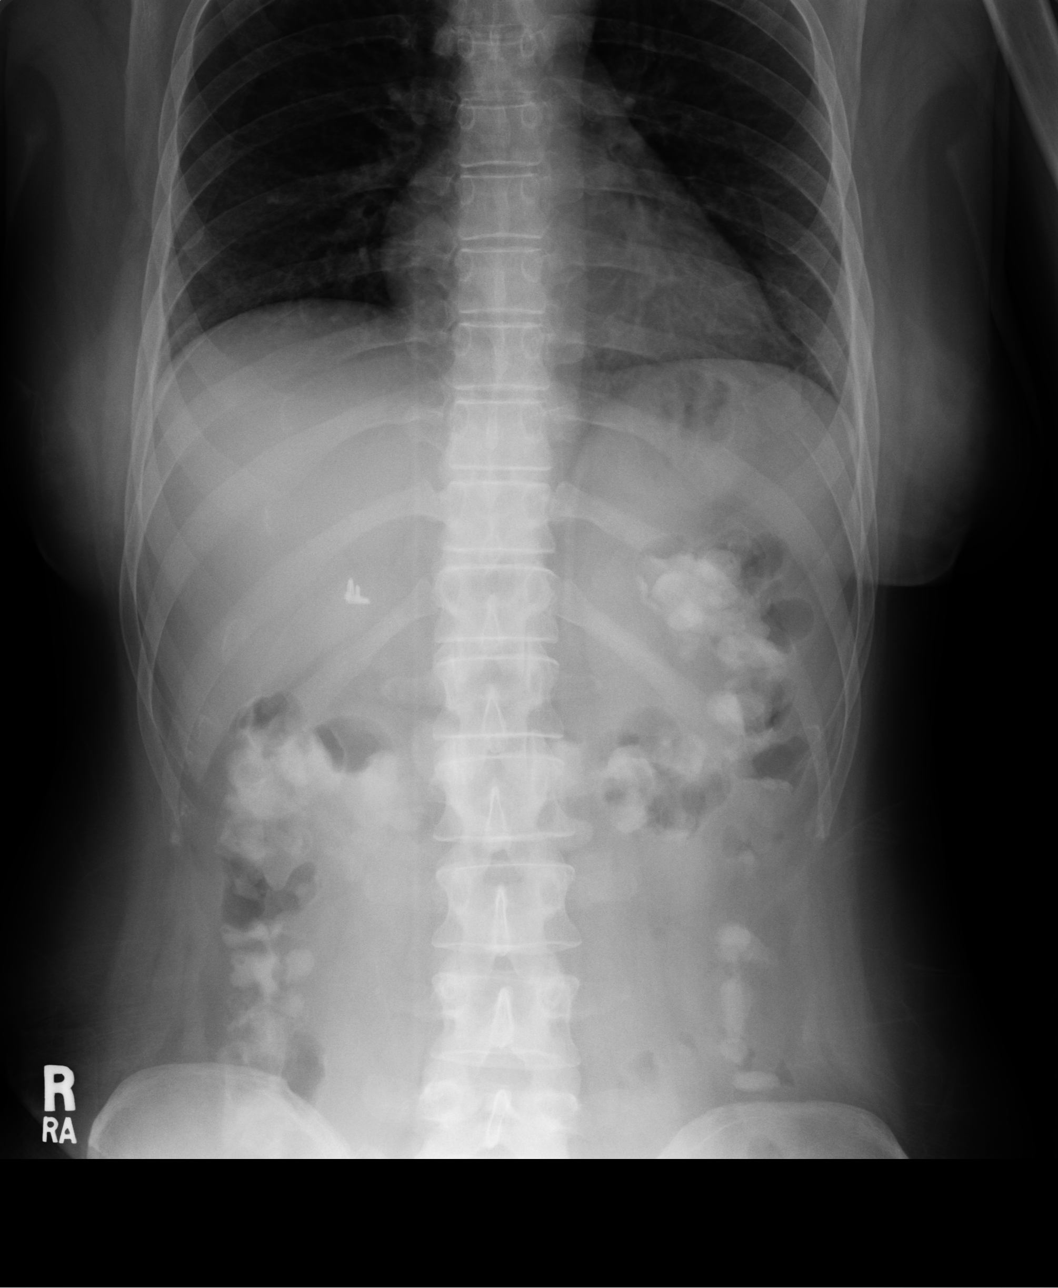
[im 3/4]
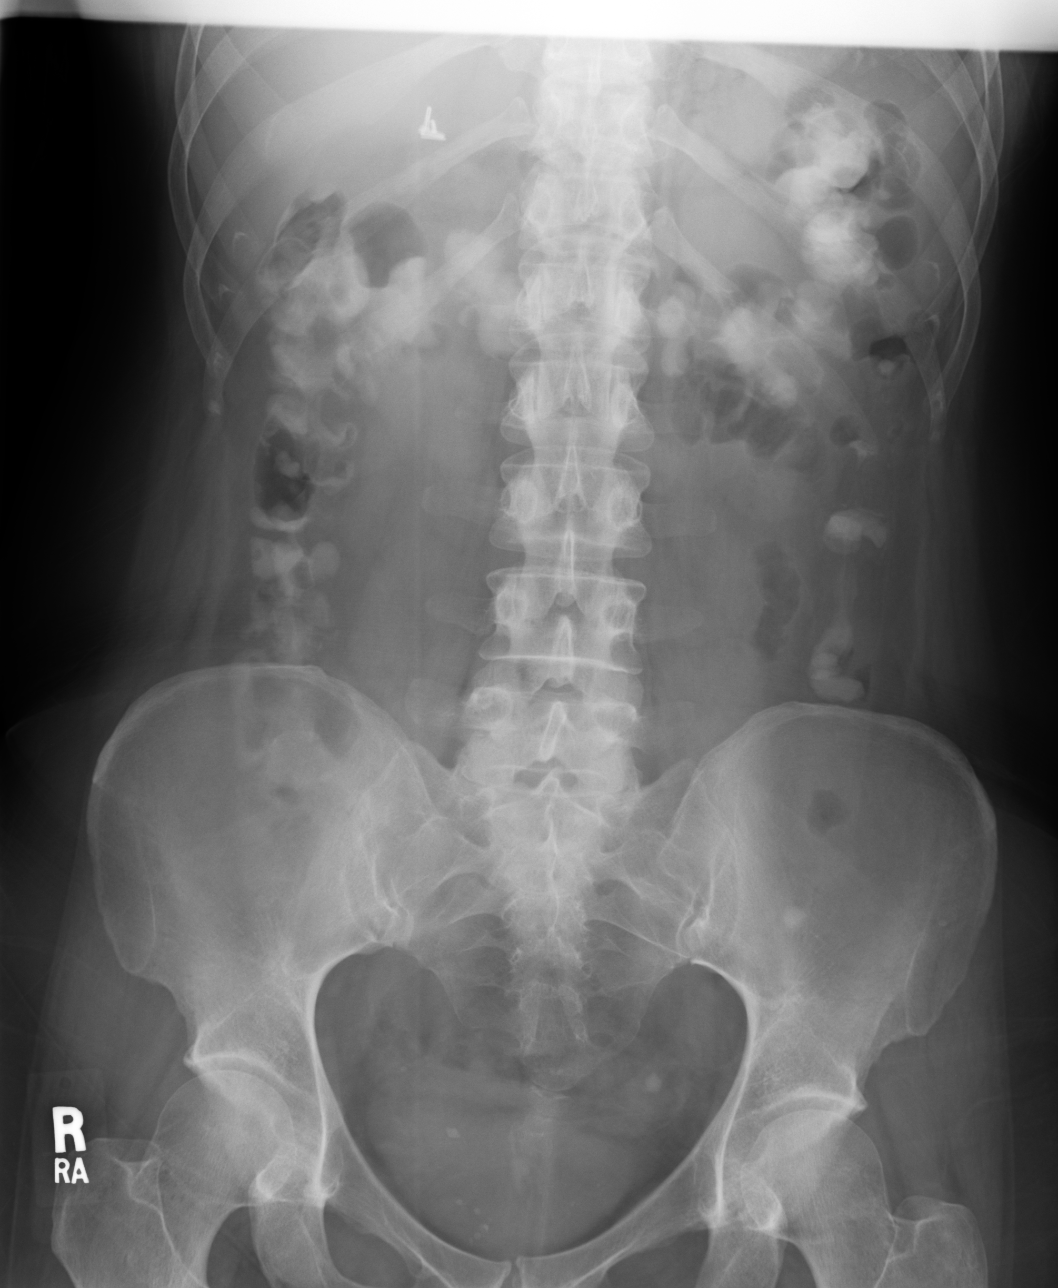
[im 4/4]
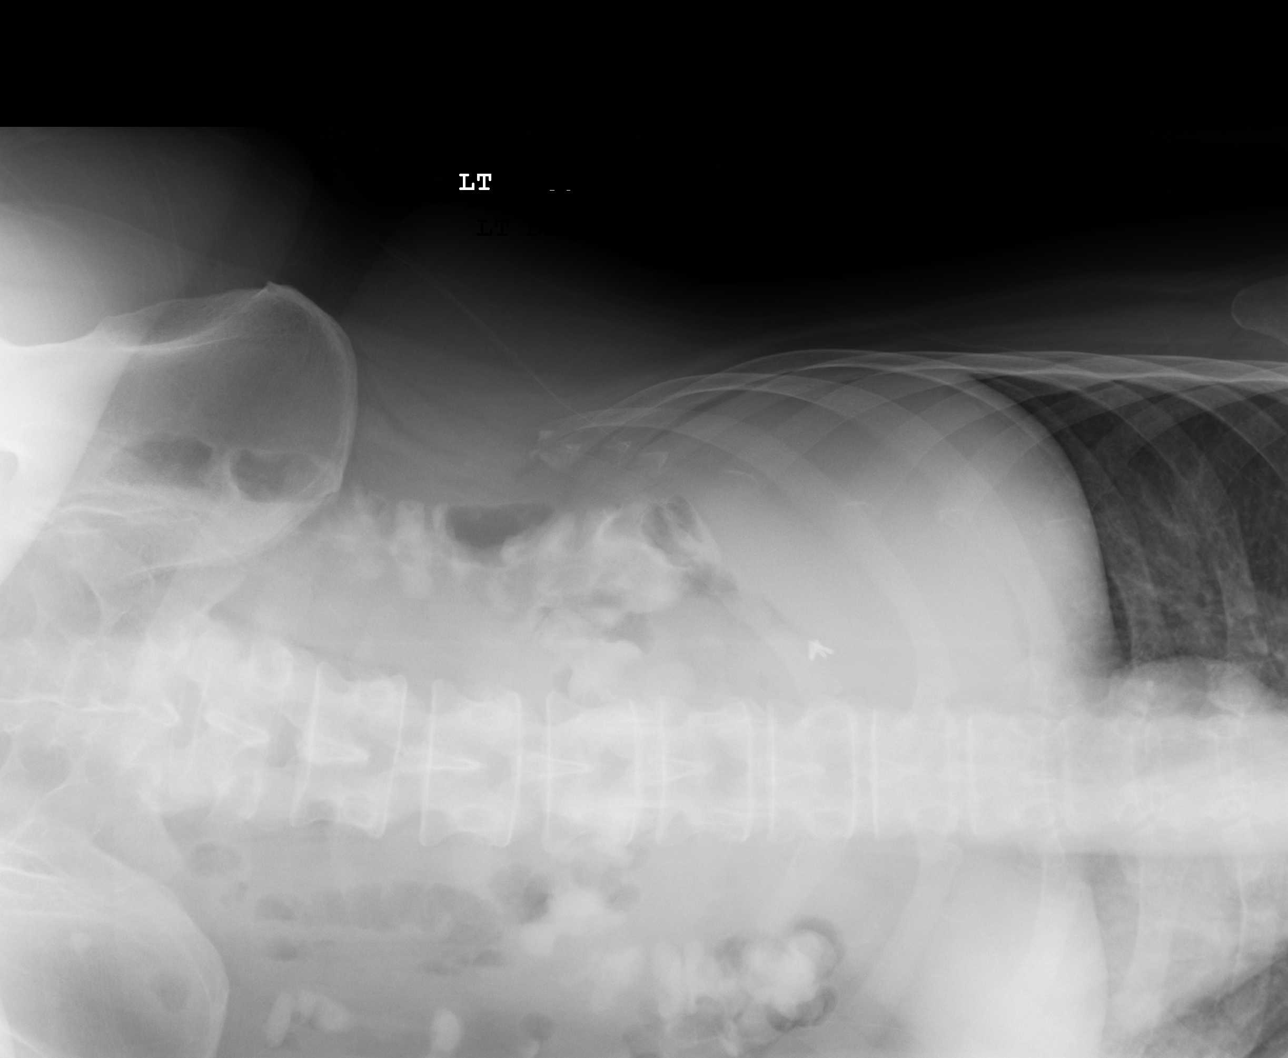

[4 of 4 positions shown; findings below may reference images not displayed]

PROCEDURE:     DXR - DXR ABDOMEN 3-WAY (INCL PA CXR)  - [DATE] [DATE]

RESULT:        An AP view of the chest shows the lung fields to be clear.
Three views of the abdomen were obtained.  No subdiaphragmatic free air is
noted.  There is observed contrast material in the colon.  This presumably
represents residual contrast from prior GI studies or prior CT.  The small
bowel gas pattern is normal.  No definitely abnormal intra-abdominal
calcifications are seen.  A few phleboliths are noted in the pelvis.
Postoperative metallic clips are noted in the region of the gallbladder bed.
IMPRESSION: No acute changes are identified.

## 2005-04-20 ENCOUNTER — Observation Stay: Payer: Self-pay | Admitting: Internal Medicine

## 2005-08-01 ENCOUNTER — Encounter: Payer: Self-pay | Admitting: Nurse Practitioner

## 2005-08-12 ENCOUNTER — Encounter: Payer: Self-pay | Admitting: Nurse Practitioner

## 2005-11-06 ENCOUNTER — Emergency Department: Payer: Self-pay | Admitting: Emergency Medicine

## 2005-11-06 ENCOUNTER — Emergency Department: Payer: Self-pay | Admitting: Internal Medicine

## 2005-11-06 IMAGING — CR DG ABDOMEN 3V
1 series · 4 of 4 positions shown · non-contrast
Comparison: none

REASON FOR EXAM: abdominal pain, vomiting       rm 1
COMMENTS:  LMP: Post Hysterectomy

[Series 1: view not recorded · 0.17mm/px · 4 of 4 slices shown]
[im 1/4]
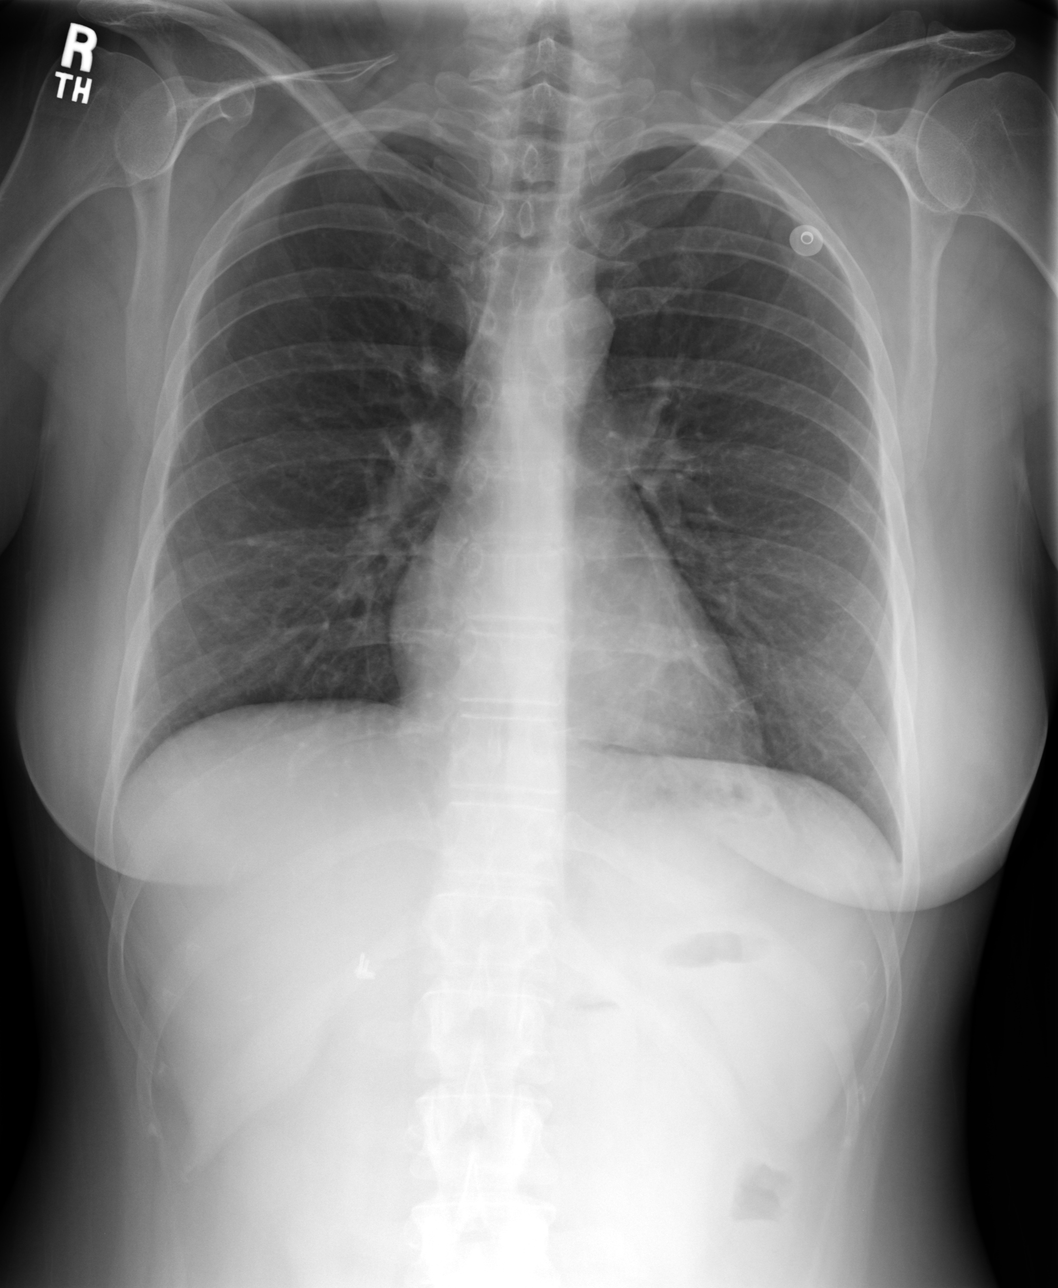
[im 2/4]
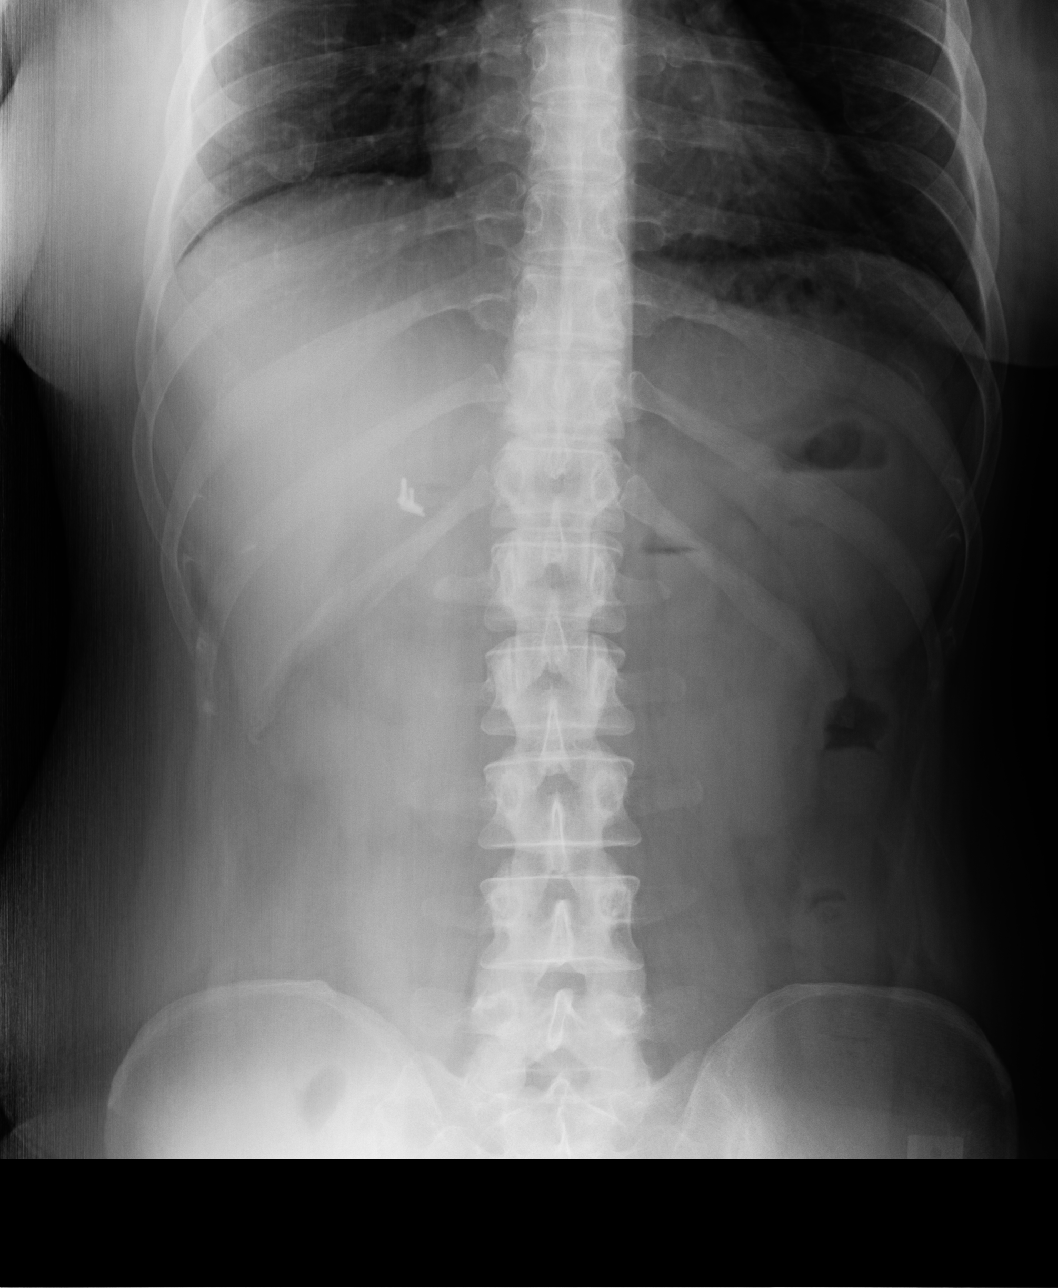
[im 3/4]
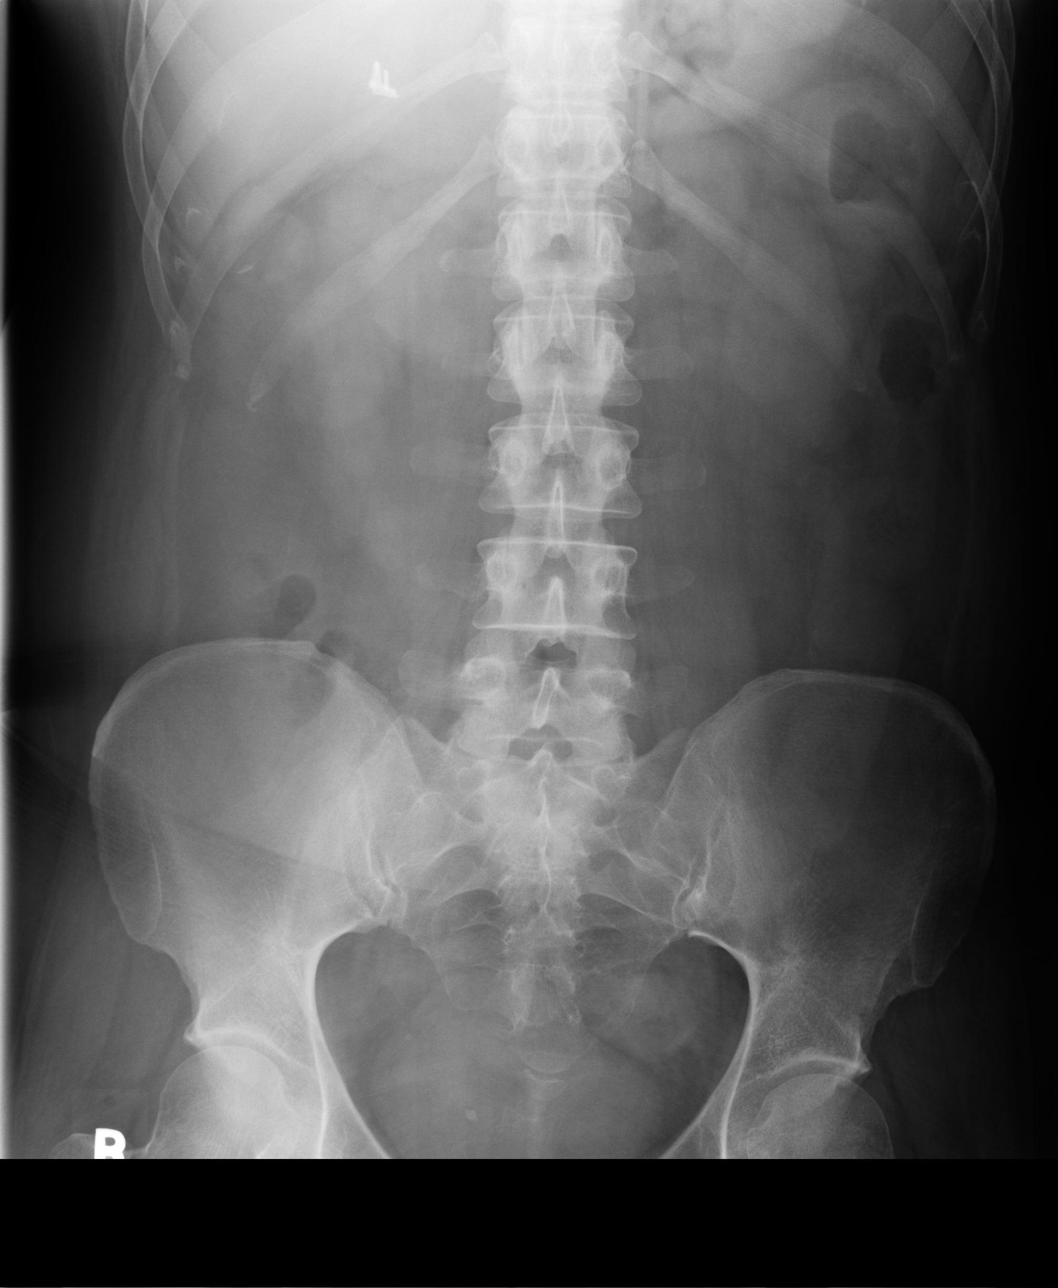
[im 4/4]
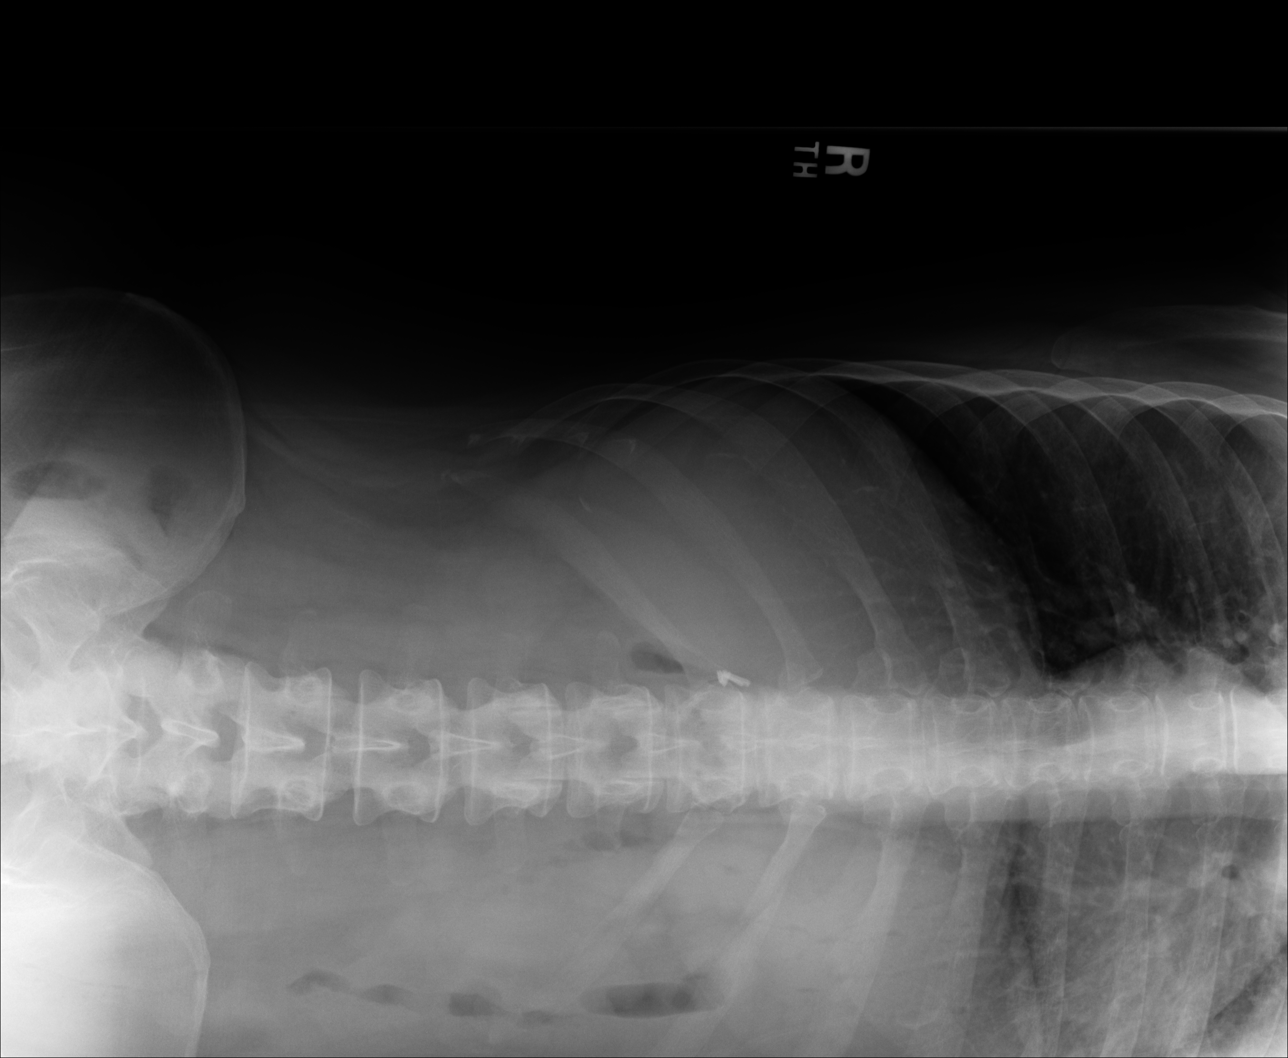

[4 of 4 positions shown; findings below may reference images not displayed]

PROCEDURE:     DXR - DXR ABDOMEN 3-WAY (INCL PA CXR)  - [DATE]  [DATE]

RESULT:     Comparison is made to study [DATE].

The lungs are adequately inflated.  There is no focal infiltrate.  The heart
and pulmonary vascularity are within the limits of normal.  Three views of
the abdomen reveal a paucity of bowel gas. There are surgical clips in the
gallbladder fossa.  I see no acute bony abnormality of the lumbar spine nor
of the visualized portions of the pelvis.  There are phleboliths within the
pelvis.
IMPRESSION: 1)I do not see evidence of bowel obstruction nor findings to suggest
perforation.

2)I see no acute cardiopulmonary abnormality.

3)If the patient's symptoms persist and remain unexplained, further
evaluation with CT scanning may be of value.

## 2006-03-05 ENCOUNTER — Encounter: Payer: Self-pay | Admitting: Nurse Practitioner

## 2007-06-15 HISTORY — PX: ESOPHAGOGASTRODUODENOSCOPY: SHX1529

## 2008-03-30 ENCOUNTER — Emergency Department: Payer: Self-pay | Admitting: Emergency Medicine

## 2008-03-31 IMAGING — CT CT ABD-PELV W/O CM
1 of 2 series · 15 of 32 positions shown, 19 images · non-contrast
Comparison: none

REASON FOR EXAM: (1) diffuse abd pain; (2) more severe pain  in right
lower quadrant
COMMENTS:

[Series 2: stone · axial · 0.62mm/px · z∈[-466,-82]mm · 15 of 145 slices shown, 19 images]
[im 11/145  soft-tissue]
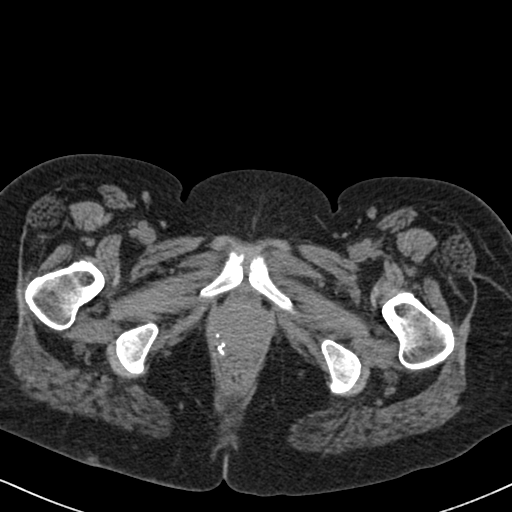
[im 11/145  bone]
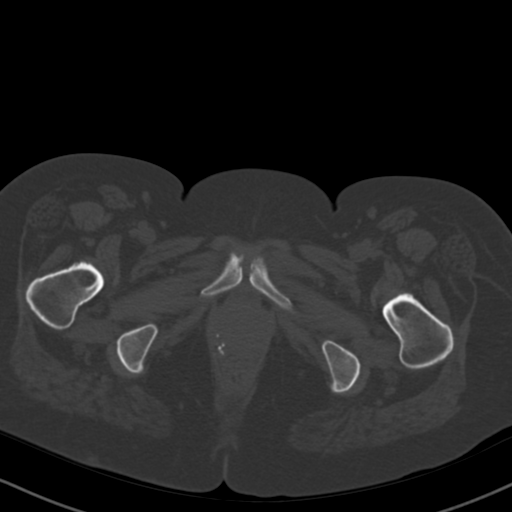
[im 22/145  soft-tissue]
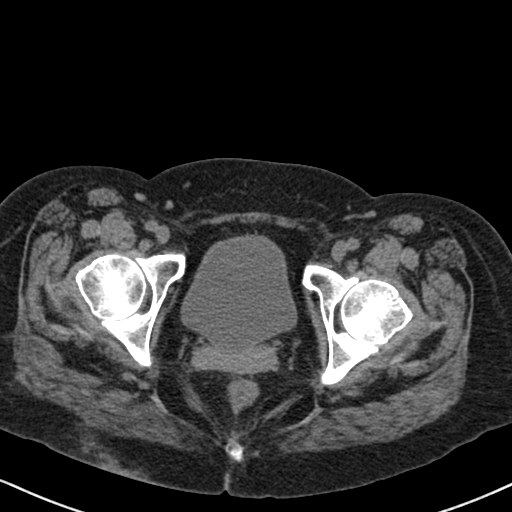
[im 33/145  soft-tissue]
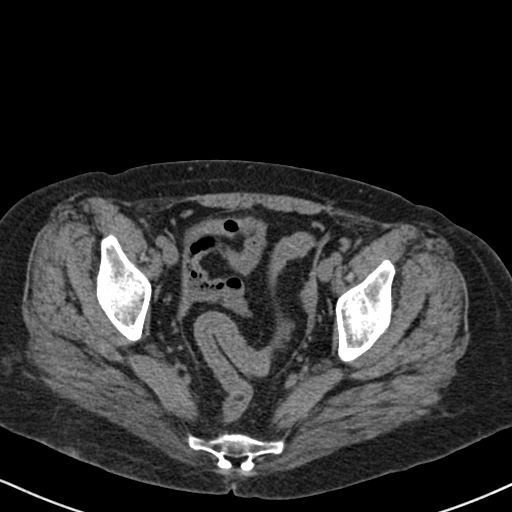
[im 43/145  soft-tissue]
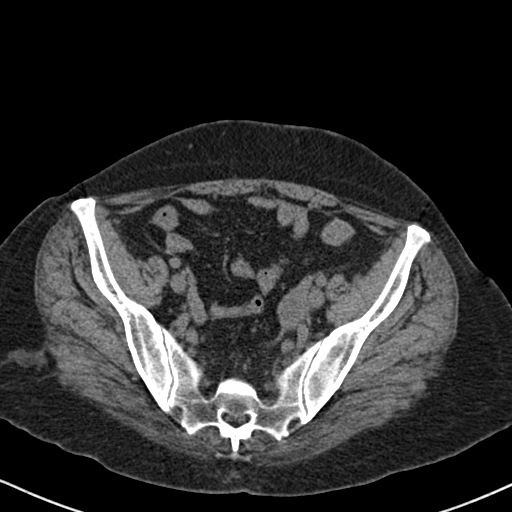
[im 54/145  soft-tissue]
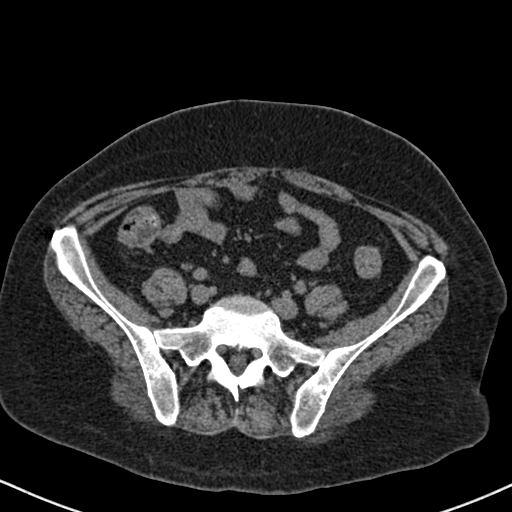
[im 65/145  soft-tissue]
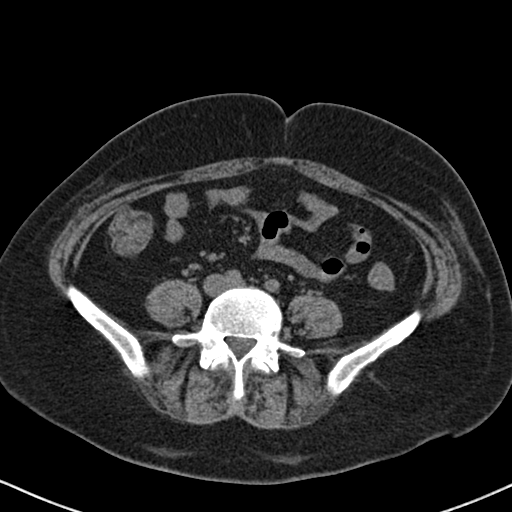
[im 75/145  soft-tissue]
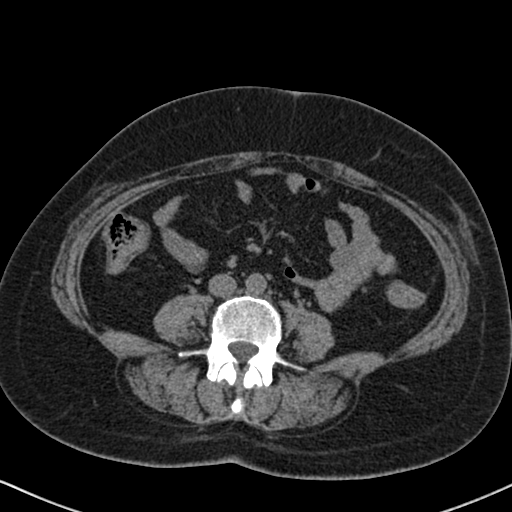
[im 86/145  soft-tissue]
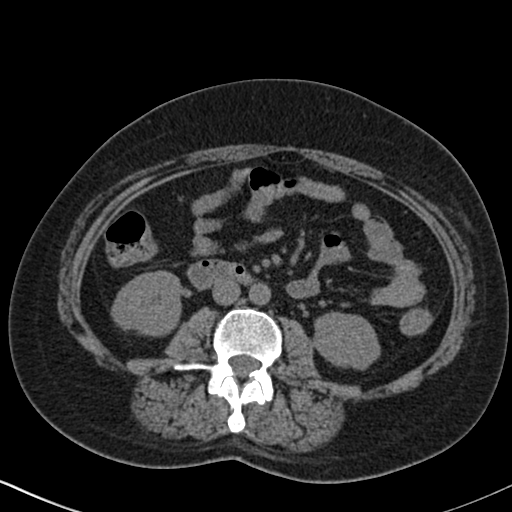
[im 97/145  soft-tissue]
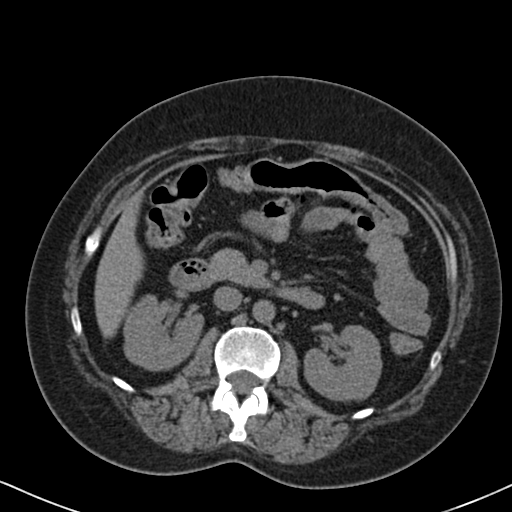
[im 97/145  bone]
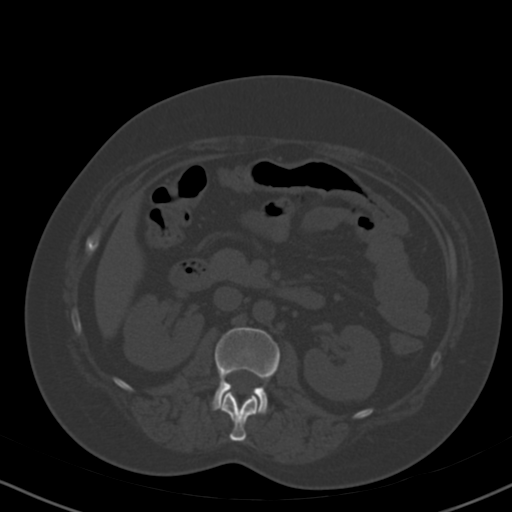
[im 107/145  soft-tissue]
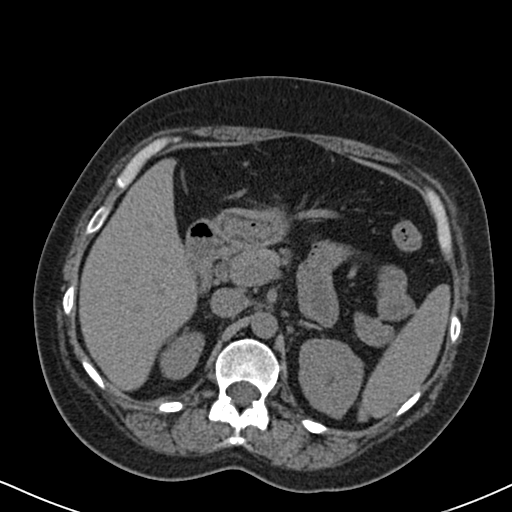
[im 118/145  soft-tissue]
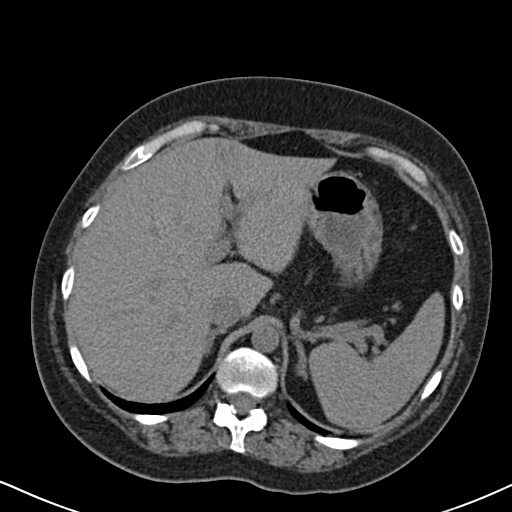
[im 123/145  lung]
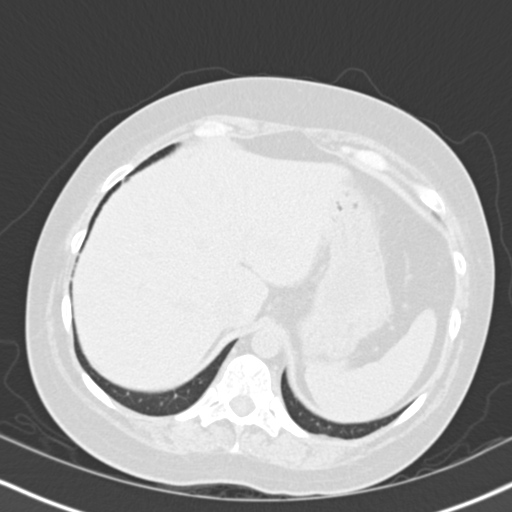
[im 129/145  soft-tissue]
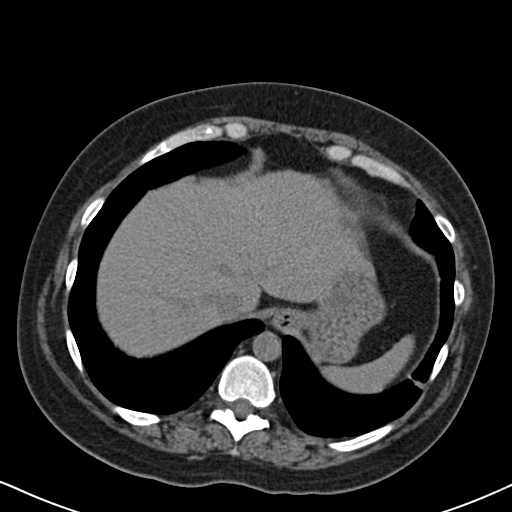
[im 129/145  lung]
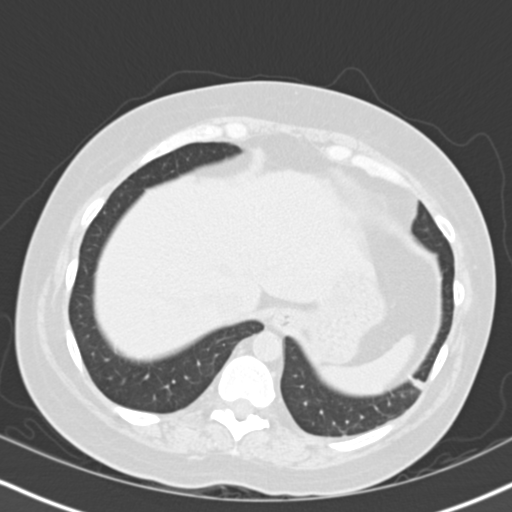
[im 134/145  lung]
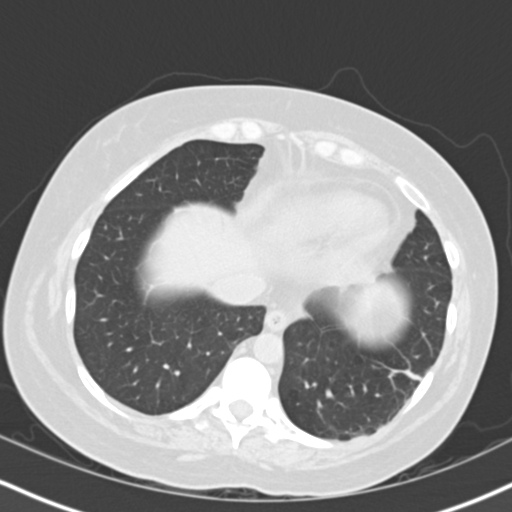
[im 139/145  soft-tissue]
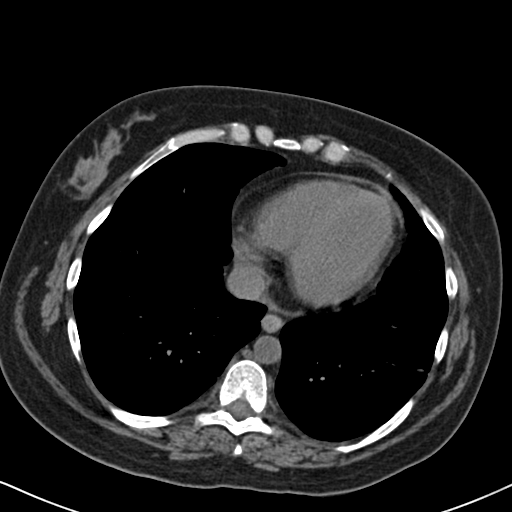
[im 139/145  lung]
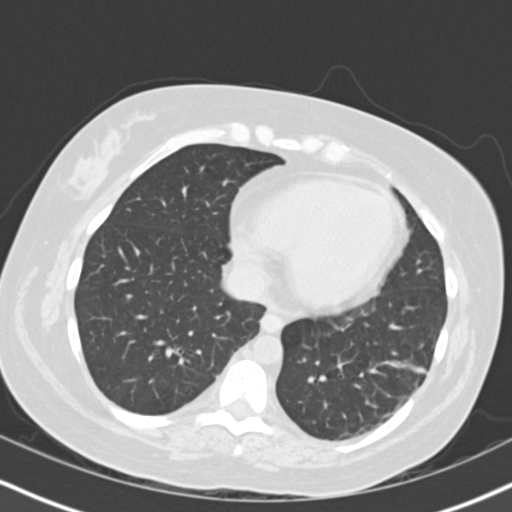

[15 of 32 positions shown; findings below may reference images not displayed]

PROCEDURE:     CT  - CT ABDOMEN AND PELVIS W[DATE]  [DATE]

RESULT:     Helical non-contrast 3 mm sections were obtained from the lung
bases through the pubic symphysis.

Evaluation of the lung bases demonstrates areas of linear increased density
within the LEFT lung base and mild thickening of the interstitial markings.

Non-contrast evaluation of the liver demonstrates a focal area of low
attenuation within the dome of the LEFT lobe of the liver anteriorly. This
is along the falciform ligament. This may represent a cyst or possibly
hemangioma. This can be further evaluated if and as clinically warranted
with triphasic CT and/or MRI. The spleen, adrenals, pancreas and kidneys are
unremarkable. There is no CT evidence of abdominal or pelvic free fluid,
drainable loculated fluid collections nor secondary signs reflecting the
sequela of appendicitis, diverticulitis or colitis enteritis. A focal
calcified density projects in the region of the orifice of the appendix. The
appendix otherwise is nondilated without evidence of periappendiceal fluid
nor stranding. There is no CT evidence of an abdominal aortic aneurysm.
IMPRESSION: 1. No CT evidence of acute pathology.
2. Findings which may represent the sequela of an appendicolith at the base
of the appendix though there is no CT evidence of appendicitis.
3. Low attenuating area within the dome of the LEFT lobe of the liver as
described above. This may represent a cyst or small hemangioma and can be
further evaluated if and as clinically warranted.
4. Findings likely representing scarring and/or atelectasis within the LEFT
lung base.

Dr. KESSEL of the Emergency Department was informed of these findings at
the time of the initial interpretation.

## 2008-04-01 ENCOUNTER — Emergency Department: Payer: Self-pay | Admitting: Emergency Medicine

## 2009-07-12 HISTORY — PX: COLONOSCOPY WITH ESOPHAGOGASTRODUODENOSCOPY (EGD): SHX5779

## 2010-02-04 ENCOUNTER — Emergency Department (HOSPITAL_COMMUNITY): Admission: EM | Admit: 2010-02-04 | Discharge: 2010-02-04 | Payer: Self-pay | Admitting: Emergency Medicine

## 2010-04-20 ENCOUNTER — Emergency Department (HOSPITAL_COMMUNITY)
Admission: EM | Admit: 2010-04-20 | Discharge: 2010-04-20 | Payer: Self-pay | Source: Home / Self Care | Admitting: Emergency Medicine

## 2010-06-30 ENCOUNTER — Emergency Department (HOSPITAL_COMMUNITY)
Admission: EM | Admit: 2010-06-30 | Discharge: 2010-06-30 | Disposition: A | Discharge status: Home / Self Care | Payer: Self-pay | Attending: Emergency Medicine | Admitting: Emergency Medicine

## 2010-06-30 DIAGNOSIS — I10 Essential (primary) hypertension: Secondary | ICD-10-CM | POA: Insufficient documentation

## 2010-06-30 DIAGNOSIS — M533 Sacrococcygeal disorders, not elsewhere classified: Secondary | ICD-10-CM | POA: Insufficient documentation

## 2010-06-30 DIAGNOSIS — R209 Unspecified disturbances of skin sensation: Secondary | ICD-10-CM | POA: Insufficient documentation

## 2010-06-30 DIAGNOSIS — X500XXA Overexertion from strenuous movement or load, initial encounter: Secondary | ICD-10-CM | POA: Insufficient documentation

## 2010-06-30 DIAGNOSIS — M545 Low back pain, unspecified: Secondary | ICD-10-CM | POA: Insufficient documentation

## 2010-06-30 DIAGNOSIS — M5430 Sciatica, unspecified side: Secondary | ICD-10-CM | POA: Insufficient documentation

## 2010-06-30 DIAGNOSIS — S335XXA Sprain of ligaments of lumbar spine, initial encounter: Secondary | ICD-10-CM | POA: Insufficient documentation

## 2010-06-30 DIAGNOSIS — M79609 Pain in unspecified limb: Secondary | ICD-10-CM | POA: Insufficient documentation

## 2010-06-30 DIAGNOSIS — Z79899 Other long term (current) drug therapy: Secondary | ICD-10-CM | POA: Insufficient documentation

## 2010-06-30 DIAGNOSIS — Y929 Unspecified place or not applicable: Secondary | ICD-10-CM | POA: Insufficient documentation

## 2010-09-29 NOTE — H&P (Signed)
NAMEHILLIARY, [name]                ACCOUNT NO.:  [id number]   MEDICAL RECORD NO.:  [id number]          PATIENT TYPE:  INP   LOCATION:  A322                          FACILITY:  [facility]   PHYSICIAN:  Vania Rea, M.D. DATE OF BIRTH:  [birth year]   DATE OF ADMISSION:  07/24/2004  DATE OF DISCHARGE:  LH                                HISTORY & PHYSICAL   PRIMARY CARE PHYSICIAN:  Dr. Randell Patient.   CHIEF COMPLAINT:  Intractable nausea and vomiting.   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  The patient was in the emergency room for a prolonged period of  time receiving Phenergan and Zofran without much affect on the patient's  symptoms.  The patient does have a history of Vicodin use for generalized  joint pain which she says is being investigated by her primary care  physician.  The patient also has a history of migraine for which she says  she takes Axert  about twice monthly; her migraine is associated with  nausea, vomiting and visual disturbances, but nowhere near the extent she is  experiencing now.   PAST MEDICAL HISTORY:  1.  Hypertension.  2.  Irritable bowel syndrome.  3.  Migraine.   PAST MEDICAL HISTORY:  Status post cholecystectomy last year.   MEDICATIONS:  1.  Atenolol 50 mg daily.  2.  Maxzide one tablet daily.  3.  Bentyl 20 mg three times daily.  4.  Phenergan 25 mg p.r.n.  5.  Vicodin p.r.n.  6.  Axert about twice monthly.   ALLERGIES:  PENICILLIN causes a rash and vomiting.   SOCIAL HISTORY:  She is an unemployed CNA, smokes 1 pack per day for the  past 20 years, denies alcohol or illicit drug use.   FAMILY HISTORY:  Family history is significant for her mother with mental  illness, her father who died at the age of 45 from an acute MI and a sister  with mental illness.  No other first-degree relatives other than her 2  children.   REVIEW OF SYSTEMS:  Review of systems, other than as noted in the admission  history and physical, is negative.   PHYSICAL EXAM:  GENERAL:  A distressed young Caucasian lady rolling about in  the bed complaining of crampy abdominal pain.  VITALS:  Her temperature is 99.8, pulse 90, respirations 18, blood pressure  122/68.  Her height is recorded as 62 inches and her weight is 135 pounds.  HEENT:  Her pupils are round, equal and reactive.  Mucous membranes are  moist.  NECK:  She has no lymphadenopathy.  CHEST:  Her chest is clear to auscultation bilaterally.  CARDIOVASCULAR:  Regular rhythm.  ABDOMEN:  Her abdomen is diffusely tender, but nevertheless is soft.  There  is no guarding.  Bowel sounds seem to be decreased in the right lower  quadrant.  EXTREMITIES:  Her extremities are without edema.  She has 2+ pulses  bilaterally.  CENTRAL NERVOUS SYSTEM:  She is alert and oriented x3.  She has no focal  neurologic deficit.   LABORATORIES:  Her white count is elevated at 18.6, her hemoglobin is 13.8,  hematocrit 38.6, platelet count 604,000; she has 83% neutrophils; her  absolute neutrophil count is 15.4.  Her sodium is 132, potassium 3.7,  chloride 102, CO2 22, glucose 105, BUN 15, creatinine 0.9.  Liver function  tests are normal.  Amylase and lipase are normal.  Her urine drug screen was  positive for barbiturates, benzodiazepines and opiates, negative cannabis.  Her alcohol level was less than 5.  Her urinalysis was completely bland with  a specific gravity of 1.010.   Acute abdominal series revealed no abnormality.   ASSESSMENT:  1.  Acute gastroenteritis versus acute exacerbation of irritable bowel      syndrome.  2.  Anxiety disorder.  3.   Hypertension, controlled.  4.  History of migraines.  5.  Strong family history of mental illness.   PLAN:  1.  We will keep this lady n.p.o., give her IV fluids with potassium.  2.  She has a leukocytosis and we will do a stool workup and start her on      intravenous Flagyl.  3.  We will hold off on Cipro until after the CT scan of the abdomen.  4.  We will withhold narcotics, but treat her vigorously with Phenergan,      Reglan and Levsin, and also Ativan.      LC/MEDQ  D:  07/25/2004  T:  07/25/2004  Job:  [id number]

## 2010-10-28 ENCOUNTER — Emergency Department (HOSPITAL_COMMUNITY)
Admission: EM | Admit: 2010-10-28 | Discharge: 2010-10-28 | Disposition: A | Discharge status: Home / Self Care | Payer: Self-pay | Attending: Emergency Medicine | Admitting: Emergency Medicine

## 2010-10-28 DIAGNOSIS — I10 Essential (primary) hypertension: Secondary | ICD-10-CM | POA: Insufficient documentation

## 2010-10-28 DIAGNOSIS — M5430 Sciatica, unspecified side: Secondary | ICD-10-CM | POA: Insufficient documentation

## 2010-10-28 DIAGNOSIS — F17200 Nicotine dependence, unspecified, uncomplicated: Secondary | ICD-10-CM | POA: Insufficient documentation

## 2011-07-08 ENCOUNTER — Emergency Department (HOSPITAL_COMMUNITY): Payer: Self-pay

## 2011-07-08 ENCOUNTER — Emergency Department (HOSPITAL_COMMUNITY)
Admission: EM | Admit: 2011-07-08 | Discharge: 2011-07-08 | Disposition: A | Discharge status: Home / Self Care | Payer: Self-pay | Attending: Emergency Medicine | Admitting: Emergency Medicine

## 2011-07-08 ENCOUNTER — Encounter (HOSPITAL_COMMUNITY): Payer: Self-pay

## 2011-07-08 DIAGNOSIS — S335XXA Sprain of ligaments of lumbar spine, initial encounter: Secondary | ICD-10-CM | POA: Insufficient documentation

## 2011-07-08 DIAGNOSIS — M545 Low back pain, unspecified: Secondary | ICD-10-CM | POA: Insufficient documentation

## 2011-07-08 DIAGNOSIS — I10 Essential (primary) hypertension: Secondary | ICD-10-CM | POA: Insufficient documentation

## 2011-07-08 DIAGNOSIS — W010XXA Fall on same level from slipping, tripping and stumbling without subsequent striking against object, initial encounter: Secondary | ICD-10-CM | POA: Insufficient documentation

## 2011-07-08 DIAGNOSIS — S39012A Strain of muscle, fascia and tendon of lower back, initial encounter: Secondary | ICD-10-CM

## 2011-07-08 HISTORY — DX: Essential (primary) hypertension: I10

## 2011-07-08 IMAGING — CR DG LUMBAR SPINE COMPLETE 4+V
5 series · 5 of 5 positions shown · non-contrast
Comparison: None.

CLINICAL DATA: Fell and injured low back.

LUMBAR SPINE - COMPLETE 4+ VIEW [DATE]:

[view not recorded (1 of 5)]
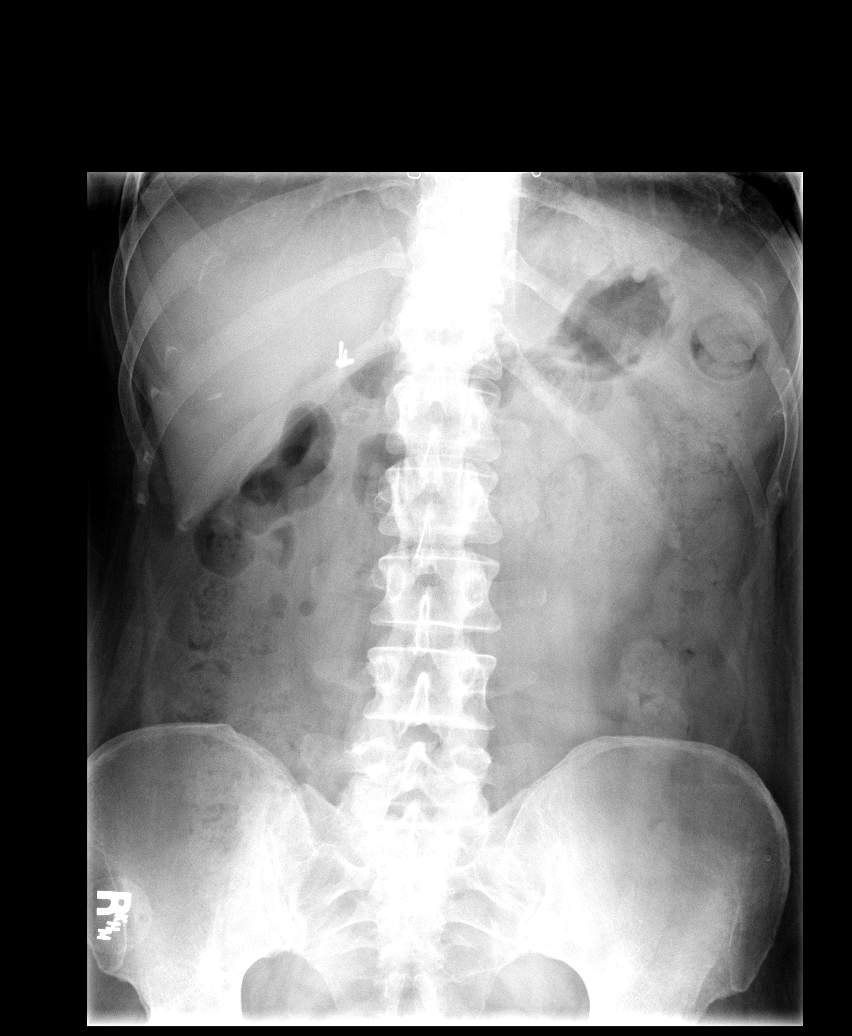

[view not recorded (2 of 5)]
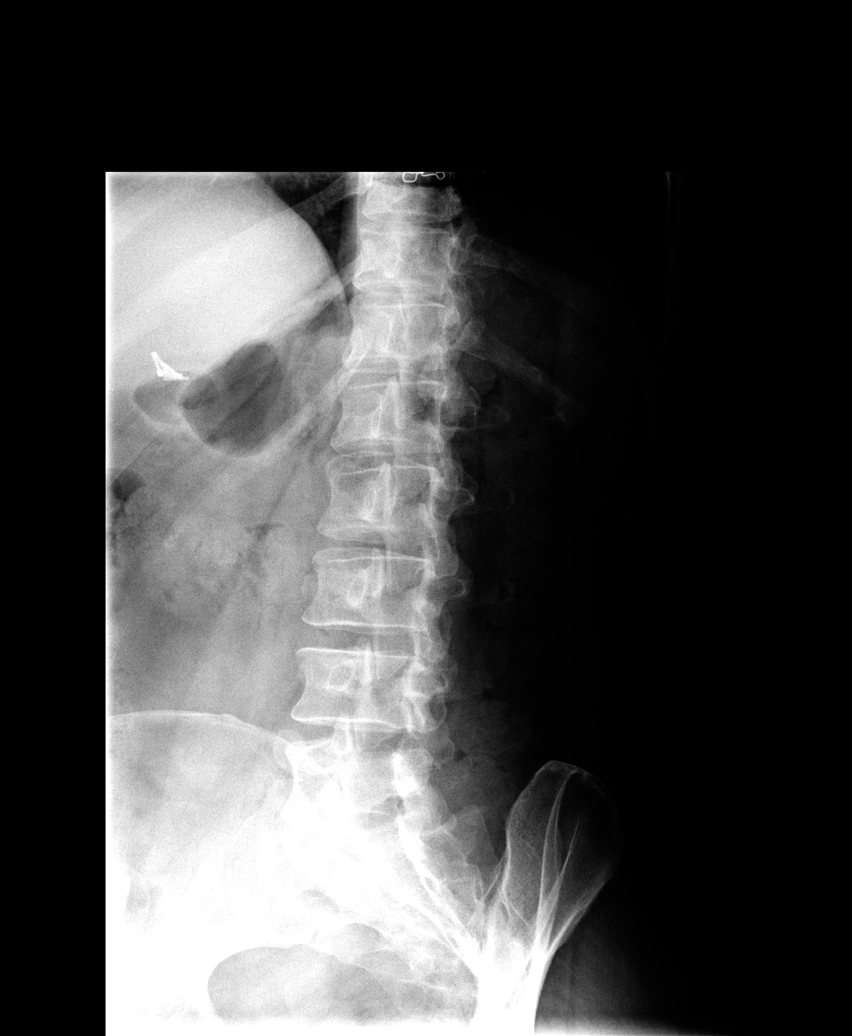

[view not recorded (3 of 5)]
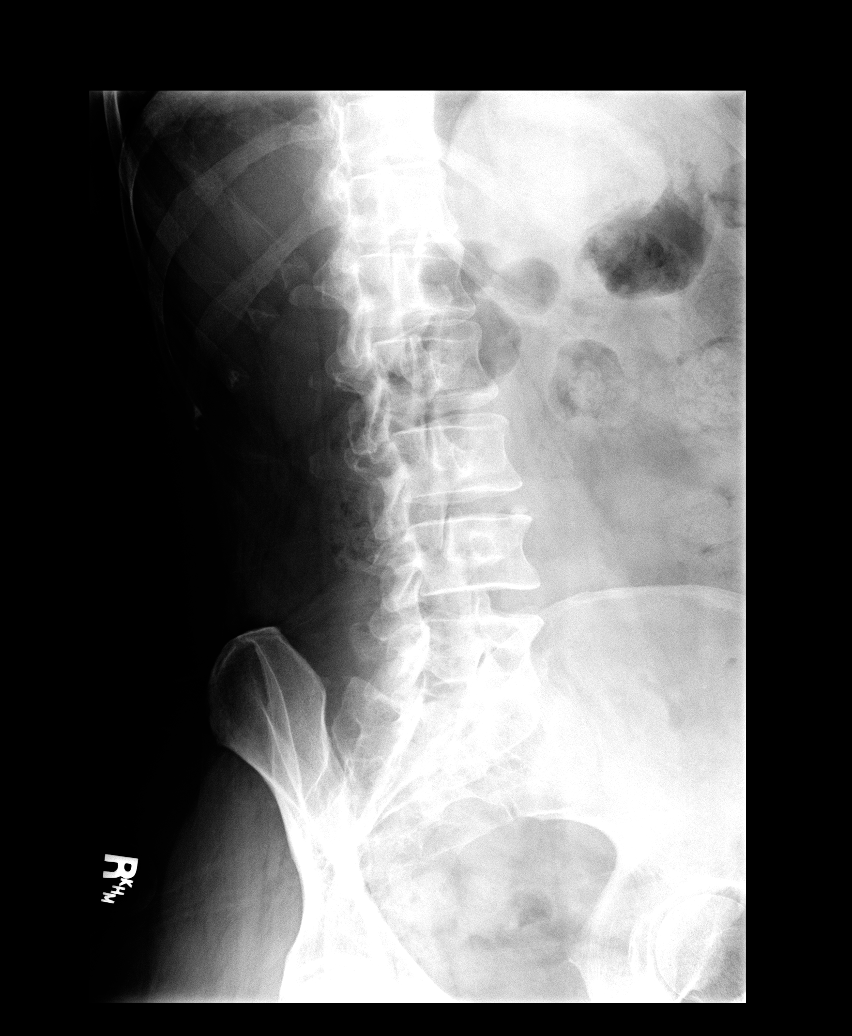

[view not recorded (4 of 5)]
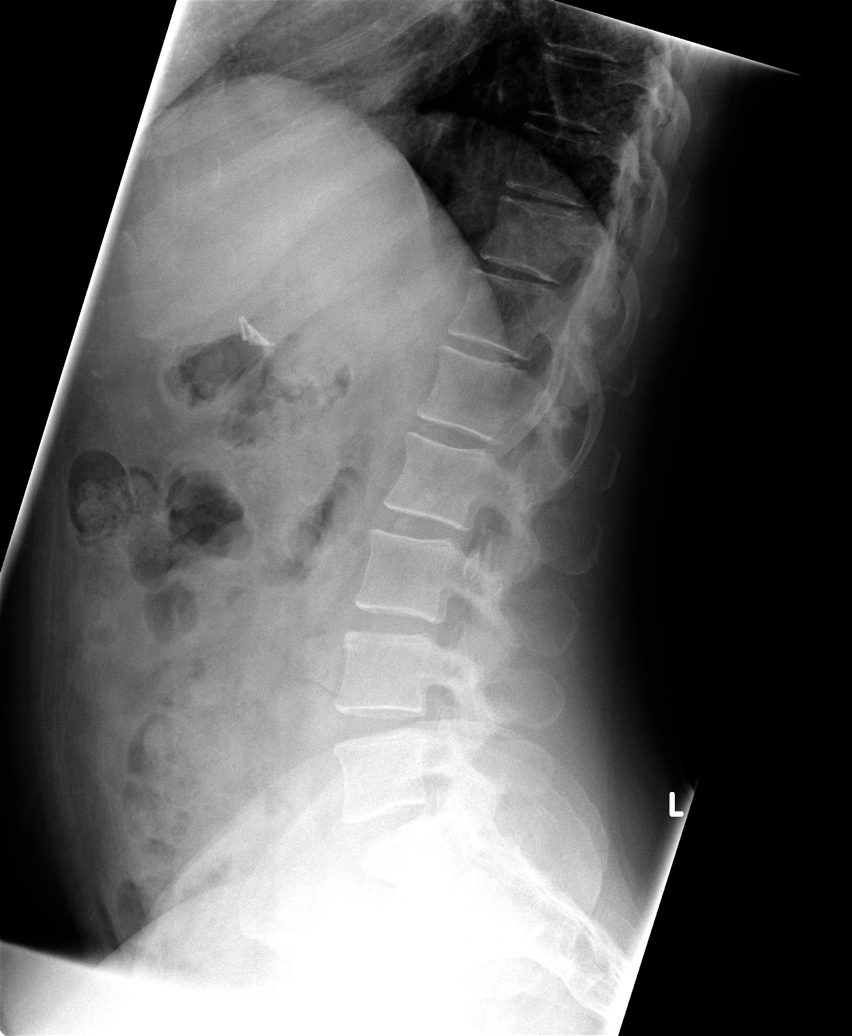

[view not recorded (5 of 5)]
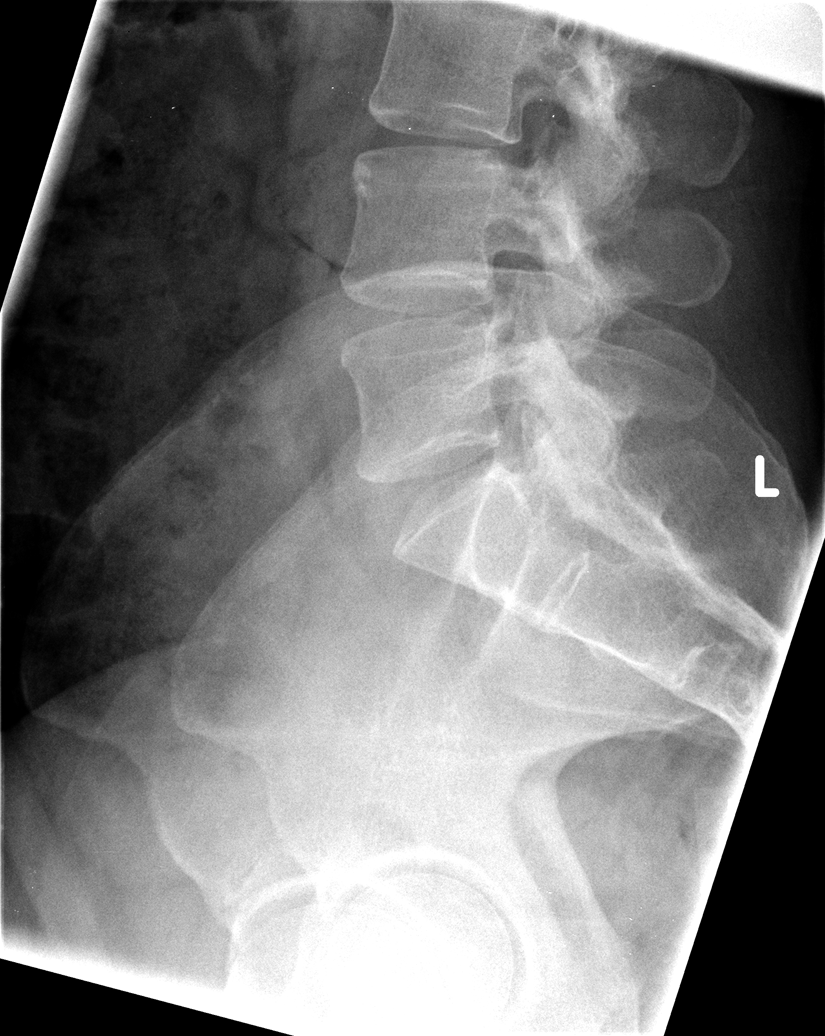

[5 of 5 positions shown; findings below may reference images not displayed]

FINDINGS: Five non-rib bearing lumbar vertebra with anatomic
alignment.  No visible fractures.  Well-preserved disc spaces.  No
pars defects.  No significant facet arthropathy.  No evidence of
spondylosis.  Visualized sacroiliac joints intact.
IMPRESSION: Normal examination.

## 2011-07-08 MED ORDER — PREDNISONE 20 MG PO TABS: 60.0000 mg | ORAL_TABLET | Freq: Every day | ORAL | Status: AC

## 2011-07-08 MED ORDER — OXYCODONE-ACETAMINOPHEN 5-325 MG PO TABS
2.0000 | ORAL_TABLET | Freq: Once | ORAL | Status: AC
  Administered 2011-07-08: 2 via ORAL
  Filled 2011-07-08: qty 2

## 2011-07-08 MED ORDER — PREDNISONE 20 MG PO TABS
60.0000 mg | ORAL_TABLET | Freq: Once | ORAL | Status: AC
  Administered 2011-07-08: 60 mg via ORAL
  Filled 2011-07-08: qty 3

## 2011-07-08 MED ORDER — OXYCODONE-ACETAMINOPHEN 5-325 MG PO TABS: 1.0000 | ORAL_TABLET | ORAL | Status: DC | PRN

## 2011-07-08 NOTE — ED Provider Notes (Signed)
History     CSN: [id number]  Arrival date & time 07/08/11  4098   First MD Initiated Contact with Patient 07/08/11 1006      Chief Complaint  Patient presents with  . Back Pain    (Consider location/radiation/quality/duration/timing/severity/associated sxs/prior treatment) Patient is a 41 y.o. female presenting with back pain. The history is provided by the patient.  Back Pain  This is a new problem. The current episode started 1 to 2 hours ago. The problem occurs constantly. The problem has not changed since onset.The pain is associated with falling (She fell on her front porch this am,  her legs slipped out and she landed directly on her buttocks. ). The pain is present in the lumbar spine. The quality of the pain is described as stabbing. The pain does not radiate. The pain is at a severity of 8/10. The pain is moderate. The symptoms are aggravated by bending, twisting and certain positions. Pertinent negatives include no chest pain, no fever, no numbness, no headaches, no abdominal pain, no bowel incontinence, no perianal numbness, no dysuria, no pelvic pain, no leg pain, no paresthesias, no paresis, no tingling and no weakness. Associated symptoms comments: She did not hit her head.. She has tried nothing for the symptoms.    Past Medical History  Diagnosis Date  . Hypertension     Past Surgical History  Procedure Date  . Abdominal hysterectomy   . Cholecystectomy   . Hand surgery     No family history on file.  History  Substance Use Topics  . Smoking status: Current Everyday Smoker  . Smokeless tobacco: Not on file  . Alcohol Use: No    OB History    Grav Para Term Preterm Abortions TAB SAB Ect Mult Living                  Review of Systems  Constitutional: Negative for fever.  HENT: Negative for congestion, sore throat and neck pain.   Eyes: Negative.   Respiratory: Negative for chest tightness and shortness of breath.   Cardiovascular: Negative for chest  pain.  Gastrointestinal: Negative for nausea, abdominal pain and bowel incontinence.  Genitourinary: Negative.  Negative for dysuria and pelvic pain.  Musculoskeletal: Positive for back pain. Negative for joint swelling and arthralgias.  Skin: Negative.  Negative for rash and wound.  Neurological: Negative for dizziness, tingling, weakness, light-headedness, numbness, headaches and paresthesias.  Hematological: Negative.   Psychiatric/Behavioral: Negative.     Allergies  Ibuprofen; Naproxen; and Penicillins  Home Medications   Current Outpatient Rx  Name Route Sig Dispense Refill  . OXYCODONE-ACETAMINOPHEN 5-325 MG PO TABS Oral Take 1 tablet by mouth every 4 (four) hours as needed for pain. 20 tablet 0  . PREDNISONE 20 MG PO TABS Oral Take 3 tablets (60 mg total) by mouth daily. 12 tablet 0    BP 125/65  Pulse 82  Temp(Src) 97.8 F (36.6 C) (Oral)  Resp 16  Ht 5\' 1"  (1.549 m)  Wt 130 lb (58.968 kg)  BMI 24.56 kg/m2  SpO2 100%  Physical Exam  Nursing note and vitals reviewed. Constitutional: She is oriented to person, place, and time. She appears well-developed and well-nourished.  HENT:  Head: Normocephalic.  Eyes: Conjunctivae are normal.  Neck: Normal range of motion. Neck supple.  Cardiovascular: Regular rhythm and intact distal pulses.        Pedal pulses normal.  Pulmonary/Chest: Effort normal. She has no wheezes.  Abdominal: Soft. Bowel sounds are  normal. She exhibits no distension and no mass.  Musculoskeletal: Normal range of motion. She exhibits no edema.       Lumbar back: She exhibits tenderness. She exhibits no swelling, no edema and no spasm.  Neurological: She is alert and oriented to person, place, and time. She has normal strength. She displays no atrophy and no tremor. No cranial nerve deficit or sensory deficit. Gait normal.  Reflex Scores:      Patellar reflexes are 2+ on the right side and 2+ on the left side.      Achilles reflexes are 2+ on the  right side and 2+ on the left side.      No strength deficit noted in hip and knee flexor and extensor muscle groups.  Ankle flexion and extension intact.  Skin: Skin is warm and dry.  Psychiatric: She has a normal mood and affect.    ED Course  Procedures (including critical care time)  Labs Reviewed - No data to display Dg Lumbar Spine Complete  07/08/2011  *RADIOLOGY REPORT*  Clinical Data: Larey Seat and injured low back.  LUMBAR SPINE - COMPLETE 4+ VIEW 07/08/2011:  Comparison: None.  Findings: Five non-rib bearing lumbar vertebra with anatomic alignment.  No visible fractures.  Well-preserved disc spaces.  No pars defects.  No significant facet arthropathy.  No evidence of spondylosis.  Visualized sacroiliac joints intact.  IMPRESSION: Normal examination.  Original Report Authenticated By: Arnell Sieving, M.D.     1. Lumbar strain       MDM  Prednisone 5 day pulse,  Percocet,  Rest,  Heat,  Referral to Dr Margo Aye to establish care with pcp.        Candis Musa, PA 07/08/11 1122

## 2011-07-08 NOTE — Discharge Instructions (Signed)
Lumbosacral Strain Lumbosacral strain is one of the most common causes of back pain. There are many causes of back pain. Most are not serious conditions. CAUSES  Your backbone (spinal column) is made up of 24 main vertebral bodies, the sacrum, and the coccyx. These are held together by muscles and tough, fibrous tissue (ligaments). Nerve roots pass through the openings between the vertebrae. A sudden move or injury to the back may cause injury to, or pressure on, these nerves. This may result in localized back pain or pain movement (radiation) into the buttocks, down the leg, and into the foot. Sharp, shooting pain from the buttock down the back of the leg (sciatica) is frequently associated with a ruptured (herniated) disk. Pain may be caused by muscle spasm alone. Your caregiver can often find the cause of your pain by the details of your symptoms and an exam. In some cases, you may need tests (such as X-rays). Your caregiver will work with you to decide if any tests are needed based on your specific exam. HOME CARE INSTRUCTIONS   Avoid an underactive lifestyle. Active exercise, as directed by your caregiver, is your greatest weapon against back pain.   Avoid hard physical activities (tennis, racquetball, waterskiing) if you are not in proper physical condition for it. This may aggravate or create problems.   If you have a back problem, avoid sports requiring sudden body movements. Swimming and walking are generally safer activities.   Maintain good posture.   Avoid becoming overweight (obese).   Use bed rest for only the most extreme, sudden (acute) episode. Your caregiver will help you determine how much bed rest is necessary.   For acute conditions, you may put ice on the injured area.   Put ice in a plastic bag.   Place a towel between your skin and the bag.   Leave the ice on for 15 to 20 minutes at a time, every 2 hours, or as needed.   After you are improved and more active, it  may help to apply heat for 30 minutes before activities.  See your caregiver if you are having pain that lasts longer than expected. Your caregiver can advise appropriate exercises or therapy if needed. With conditioning, most back problems can be avoided. SEEK IMMEDIATE MEDICAL CARE IF:   You have numbness, tingling, weakness, or problems with the use of your arms or legs.   You experience severe back pain not relieved with medicines.   There is a change in bowel or bladder control.   You have increasing pain in any area of the body, including your belly (abdomen).   You notice shortness of breath, dizziness, or feel faint.   You feel sick to your stomach (nauseous), are throwing up (vomiting), or become sweaty.   You notice discoloration of your toes or legs, or your feet get very cold.   Your back pain is getting worse.   You have a fever.  MAKE SURE YOU:   Understand these instructions.   Will watch your condition.   Will get help right away if you are not doing well or get worse.  Document Released: 02/07/2005 Document Revised: 01/10/2011 Document Reviewed: 07/30/2008 [facility] Patient Information 2012 [place], [place].   You may take the oxycodone prescribed for pain relief.  This will make you drowsy - do not drive within 4 hours of taking this medication.  Take your next dose of prednisone tomorrow morning.

## 2011-07-08 NOTE — ED Notes (Signed)
Pt reports slipped and fell on her front porch this morning.  C/O lower back pain, nonradiating.  Denies hitting head.  Denies any neck pain.  No LOC.

## 2011-07-09 NOTE — ED Provider Notes (Signed)
Medical screening examination/treatment/procedure(s) were performed by non-physician practitioner and as supervising physician I was immediately available for consultation/collaboration.   Atsushi Yom, MD 07/09/11 0757 

## 2011-12-11 ENCOUNTER — Emergency Department (HOSPITAL_COMMUNITY)
Admission: EM | Admit: 2011-12-11 | Discharge: 2011-12-11 | Disposition: A | Discharge status: Home / Self Care | Payer: Self-pay | Attending: Emergency Medicine | Admitting: Emergency Medicine

## 2011-12-11 ENCOUNTER — Encounter (HOSPITAL_COMMUNITY): Payer: Self-pay | Admitting: *Deleted

## 2011-12-11 DIAGNOSIS — Y998 Other external cause status: Secondary | ICD-10-CM | POA: Insufficient documentation

## 2011-12-11 DIAGNOSIS — I10 Essential (primary) hypertension: Secondary | ICD-10-CM | POA: Insufficient documentation

## 2011-12-11 DIAGNOSIS — S29012A Strain of muscle and tendon of back wall of thorax, initial encounter: Secondary | ICD-10-CM

## 2011-12-11 DIAGNOSIS — X58XXXA Exposure to other specified factors, initial encounter: Secondary | ICD-10-CM | POA: Insufficient documentation

## 2011-12-11 DIAGNOSIS — S239XXA Sprain of unspecified parts of thorax, initial encounter: Secondary | ICD-10-CM | POA: Insufficient documentation

## 2011-12-11 DIAGNOSIS — Y93F2 Activity, caregiving, lifting: Secondary | ICD-10-CM | POA: Insufficient documentation

## 2011-12-11 DIAGNOSIS — F17200 Nicotine dependence, unspecified, uncomplicated: Secondary | ICD-10-CM | POA: Insufficient documentation

## 2011-12-11 MED ORDER — CYCLOBENZAPRINE HCL 10 MG PO TABS
10.0000 mg | ORAL_TABLET | Freq: Once | ORAL | Status: AC
  Administered 2011-12-11: 10 mg via ORAL
  Filled 2011-12-11: qty 1

## 2011-12-11 MED ORDER — PREDNISONE 20 MG PO TABS
60.0000 mg | ORAL_TABLET | Freq: Once | ORAL | Status: AC
  Administered 2011-12-11: 60 mg via ORAL
  Filled 2011-12-11: qty 3

## 2011-12-11 MED ORDER — HYDROCODONE-ACETAMINOPHEN 5-325 MG PO TABS: 1.0000 | ORAL_TABLET | Freq: Four times a day (QID) | ORAL | Status: AC | PRN

## 2011-12-11 MED ORDER — PREDNISONE 50 MG PO TABS: ORAL_TABLET | ORAL | Status: AC

## 2011-12-11 MED ORDER — CYCLOBENZAPRINE HCL 10 MG PO TABS: ORAL_TABLET | ORAL | Status: DC

## 2011-12-11 MED ORDER — HYDROCODONE-ACETAMINOPHEN 5-325 MG PO TABS
1.0000 | ORAL_TABLET | Freq: Once | ORAL | Status: AC
  Administered 2011-12-11: 1 via ORAL
  Filled 2011-12-11: qty 1

## 2011-12-11 NOTE — ED Provider Notes (Signed)
History     CSN: [id number]  Arrival date & time 12/11/11  1823   First MD Initiated Contact with Patient 12/11/11 1900      Chief Complaint  Patient presents with  . 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  She was assisting her pt standing up from a chair and felt "something grab in the middle of my back".  Hurts more with movement.  Patient is a 41 y.o. female presenting with back pain. The history is provided by the patient. No language interpreter was used.  Back Pain  This is a new problem. Episode onset: today. The problem occurs constantly. The problem has not changed since onset.The pain is associated with lifting heavy objects. The pain is present in the thoracic spine. The pain does not radiate. The pain is severe. The symptoms are aggravated by bending and twisting. The pain is the same all the time. Pertinent negatives include no numbness, no bowel incontinence, no perianal numbness, no bladder incontinence, no dysuria, no leg pain, no paresthesias, no paresis, no tingling and no weakness. She has tried nothing for the symptoms.    Past Medical History  Diagnosis Date  . Hypertension     Past Surgical History  Procedure Date  . Abdominal hysterectomy   . Cholecystectomy   . Hand surgery     History reviewed. No pertinent family history.  History  Substance Use Topics  . Smoking status: Current Everyday Smoker    Types: Cigarettes  . Smokeless tobacco: Not on file  . Alcohol Use: No    OB History    Grav Para Term Preterm Abortions TAB SAB Ect Mult Living                  Review of Systems  Gastrointestinal: Negative for bowel incontinence.  Genitourinary: Negative for bladder incontinence and dysuria.  Musculoskeletal: Positive for back pain.  Neurological: Negative for tingling, weakness, numbness and paresthesias.  All other  systems reviewed and are negative.    Allergies  Ibuprofen; Naproxen; and Penicillins  Home Medications   Current Outpatient Rx  Name Route Sig Dispense Refill  . ASPIRIN-SALICYLAMIDE-CAFFEINE 325-95-16 MG PO TABS Oral Take 1 packet by mouth daily as needed. FOR PAIN    . CYCLOBENZAPRINE HCL 10 MG PO TABS  1/2 to one tab po TID 20 tablet 0  . HYDROCODONE-ACETAMINOPHEN 5-325 MG PO TABS Oral Take 1 tablet by mouth every 6 (six) hours as needed for pain. 12 tablet 0  . PREDNISONE 50 MG PO TABS  One tab po QD 6 tablet 0    BP 152/85  Pulse 98  Temp 97.8 F (36.6 C) (Oral)  Resp 20  Ht 5\' 1"  (1.549 m)  Wt 140 lb (63.504 kg)  BMI 26.45 kg/m2  SpO2 100%  Physical Exam  Nursing note and vitals reviewed. Constitutional: She is oriented to person, place, and time. She appears well-developed and well-nourished. No distress.  HENT:  Head: Normocephalic and atraumatic.  Eyes: EOM are normal.  Neck: Normal range of motion.  Cardiovascular: Normal rate, regular rhythm and normal heart sounds.   Pulmonary/Chest: Effort normal and breath sounds normal.  Abdominal: Soft. She exhibits no distension. There is no tenderness.  Musculoskeletal: She exhibits tenderness.       Thoracic back: She exhibits decreased range of motion, tenderness and pain. She exhibits no bony tenderness, no swelling, no edema, no deformity, no  laceration, no spasm and normal pulse.       Back:  Neurological: She is alert and oriented to person, place, and time. She has normal strength. A cranial nerve deficit is present. No sensory deficit. Coordination and gait normal. GCS eye subscore is 4. GCS verbal subscore is 5. GCS motor subscore is 6.  Reflex Scores:      Patellar reflexes are 2+ on the right side and 2+ on the left side.      Achilles reflexes are 2+ on the right side and 2+ on the left side. Skin: Skin is warm and dry.  Psychiatric: She has a normal mood and affect. Judgment normal.    ED Course    Procedures (including critical care time)  Labs Reviewed - No data to display No results found.   1. Strain of thoracic spine       MDM  rx-prednisone 50 mg,6 rx-flexeril 10 mg, 20 rx-hydrocodone, 12 Ice F/u [facility]        Evalina Field, [place] 12/11/11 [phone]

## 2011-12-11 NOTE — ED Notes (Signed)
Pt c/o mid back pain since this morning. 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

## 2011-12-11 NOTE — ED Provider Notes (Signed)
Medical screening examination/treatment/procedure(s) were performed by non-physician practitioner and as supervising physician I was immediately available for consultation/collaboration.   Domenico Achord B. Bernette Mayers, MD 12/11/11 [phone]

## 2011-12-11 NOTE — ED Notes (Signed)
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

## 2012-07-19 ENCOUNTER — Emergency Department (HOSPITAL_COMMUNITY)
Admission: EM | Admit: 2012-07-19 | Discharge: 2012-07-19 | Disposition: A | Discharge status: Home / Self Care | Payer: Self-pay | Attending: Emergency Medicine | Admitting: Emergency Medicine

## 2012-07-19 ENCOUNTER — Emergency Department (HOSPITAL_COMMUNITY): Payer: Self-pay

## 2012-07-19 ENCOUNTER — Encounter (HOSPITAL_COMMUNITY): Payer: Self-pay | Admitting: *Deleted

## 2012-07-19 DIAGNOSIS — Z8679 Personal history of other diseases of the circulatory system: Secondary | ICD-10-CM | POA: Insufficient documentation

## 2012-07-19 DIAGNOSIS — J3489 Other specified disorders of nose and nasal sinuses: Secondary | ICD-10-CM | POA: Insufficient documentation

## 2012-07-19 DIAGNOSIS — H9190 Unspecified hearing loss, unspecified ear: Secondary | ICD-10-CM | POA: Insufficient documentation

## 2012-07-19 DIAGNOSIS — Z79899 Other long term (current) drug therapy: Secondary | ICD-10-CM | POA: Insufficient documentation

## 2012-07-19 DIAGNOSIS — R509 Fever, unspecified: Secondary | ICD-10-CM | POA: Insufficient documentation

## 2012-07-19 DIAGNOSIS — R059 Cough, unspecified: Secondary | ICD-10-CM | POA: Insufficient documentation

## 2012-07-19 DIAGNOSIS — R110 Nausea: Secondary | ICD-10-CM | POA: Insufficient documentation

## 2012-07-19 DIAGNOSIS — J0130 Acute sphenoidal sinusitis, unspecified: Secondary | ICD-10-CM | POA: Insufficient documentation

## 2012-07-19 DIAGNOSIS — J0100 Acute maxillary sinusitis, unspecified: Secondary | ICD-10-CM | POA: Insufficient documentation

## 2012-07-19 DIAGNOSIS — I10 Essential (primary) hypertension: Secondary | ICD-10-CM | POA: Insufficient documentation

## 2012-07-19 DIAGNOSIS — F17200 Nicotine dependence, unspecified, uncomplicated: Secondary | ICD-10-CM | POA: Insufficient documentation

## 2012-07-19 DIAGNOSIS — R0789 Other chest pain: Secondary | ICD-10-CM | POA: Insufficient documentation

## 2012-07-19 DIAGNOSIS — H53149 Visual discomfort, unspecified: Secondary | ICD-10-CM | POA: Insufficient documentation

## 2012-07-19 DIAGNOSIS — R05 Cough: Secondary | ICD-10-CM | POA: Insufficient documentation

## 2012-07-19 DIAGNOSIS — R51 Headache: Secondary | ICD-10-CM | POA: Insufficient documentation

## 2012-07-19 HISTORY — DX: Migraine, unspecified, not intractable, without status migrainosus: G43.909

## 2012-07-19 LAB — CBC WITH DIFFERENTIAL/PLATELET
Basophils Absolute: 0 10*3/uL (ref 0.0–0.1)
Basophils Relative: 1 % (ref 0–1)
Eosinophils Absolute: 0.2 10*3/uL (ref 0.0–0.7)
Eosinophils Relative: 3 % (ref 0–5)
HCT: 37.4 % (ref 36.0–46.0)
Hemoglobin: 12.6 g/dL (ref 12.0–15.0)
Lymphocytes Relative: 31 % (ref 12–46)
Lymphs Abs: 1.9 10*3/uL (ref 0.7–4.0)
MCH: 31.1 pg (ref 26.0–34.0)
MCHC: 33.7 g/dL (ref 30.0–36.0)
MCV: 92.3 fL (ref 78.0–100.0)
Monocytes Absolute: 0.5 10*3/uL (ref 0.1–1.0)
Monocytes Relative: 8 % (ref 3–12)
Neutro Abs: 3.6 10*3/uL (ref 1.7–7.7)
Neutrophils Relative %: 58 % (ref 43–77)
Platelets: 330 10*3/uL (ref 150–400)
RBC: 4.05 MIL/uL (ref 3.87–5.11)
RDW: 14 % (ref 11.5–15.5)
WBC: 6.2 10*3/uL (ref 4.0–10.5)

## 2012-07-19 LAB — BASIC METABOLIC PANEL
BUN: 9 mg/dL (ref 6–23)
CO2: 21 mEq/L (ref 19–32)
Calcium: 8.6 mg/dL (ref 8.4–10.5)
Chloride: 103 mEq/L (ref 96–112)
Creatinine, Ser: 0.8 mg/dL (ref 0.50–1.10)
GFR calc Af Amer: >90 mL/min (ref >90)
GFR calc non Af Amer: 89 mL/min — ABNORMAL LOW (ref >90)
Glucose, Bld: 107 mg/dL — ABNORMAL HIGH (ref 70–99)
Potassium: 4 mEq/L (ref 3.5–5.1)
Sodium: 136 mEq/L (ref 135–145)

## 2012-07-19 IMAGING — CT CT HEAD W/O CM
1 series · 16 of 30 positions shown, 20 images · non-contrast
Comparison: None.

CLINICAL DATA: Headache

CT HEAD WITHOUT CONTRAST
TECHNIQUE: Contiguous axial images were obtained from the base of
the skull through the vertex without contrast.

[Series 2: headseq 4.8 h37s · axial · 0.42mm/px · z∈[+97,+230]mm · 16 of 30 slices shown, 20 images]
[im 2/30  brain]
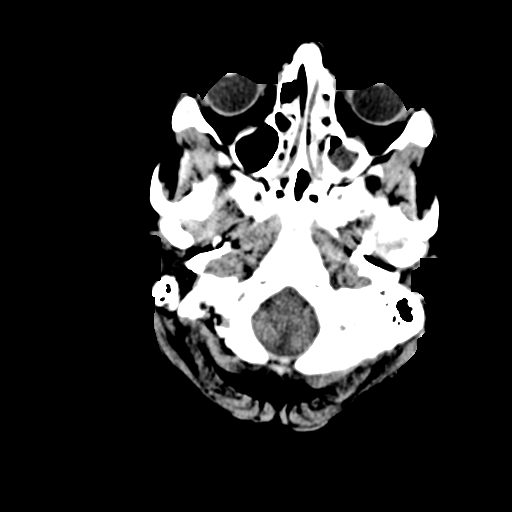
[im 2/30  bone]
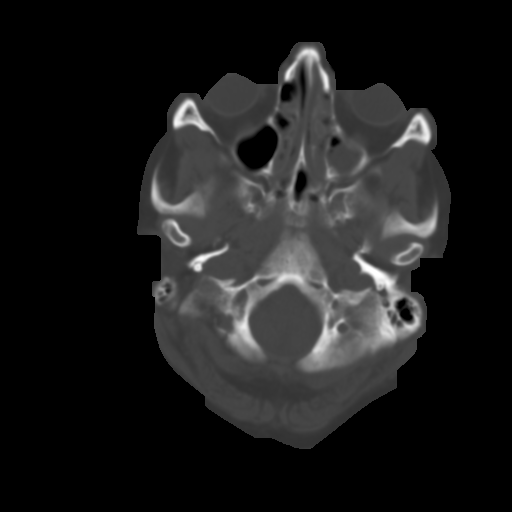
[im 4/30  brain]
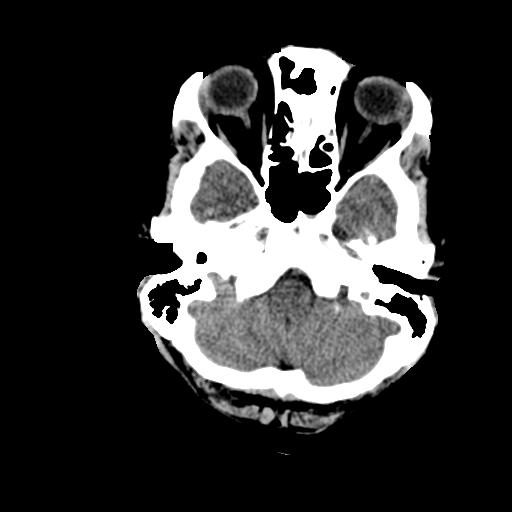
[im 6/30  brain]
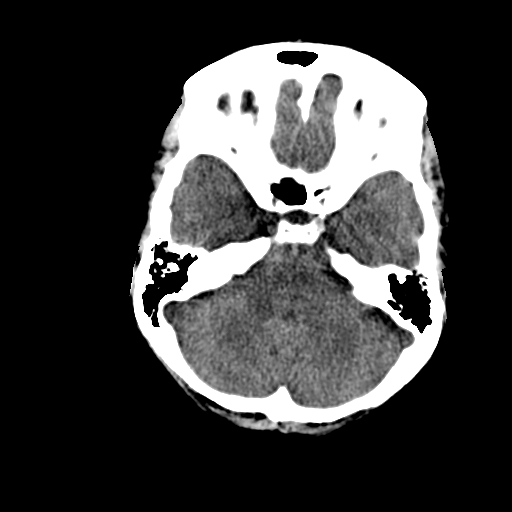
[im 8/30  brain]
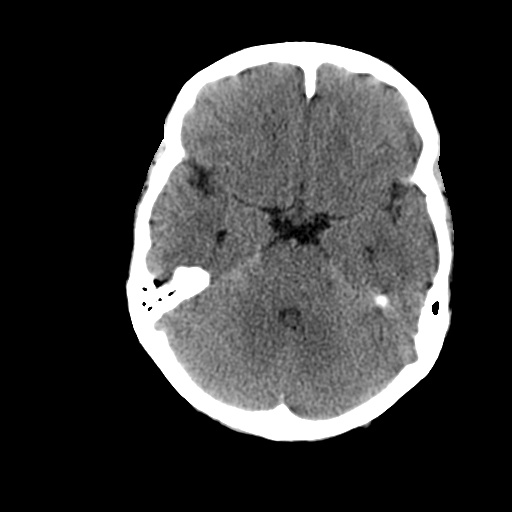
[im 9/30  brain]
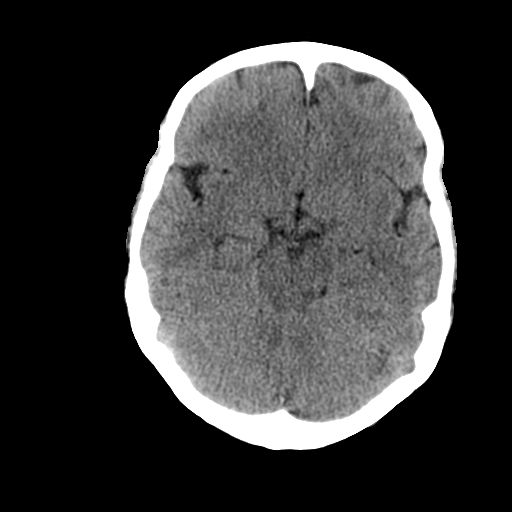
[im 9/30  bone]
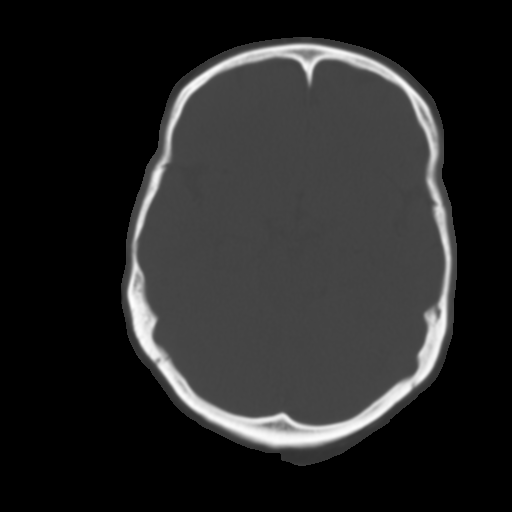
[im 11/30  brain]
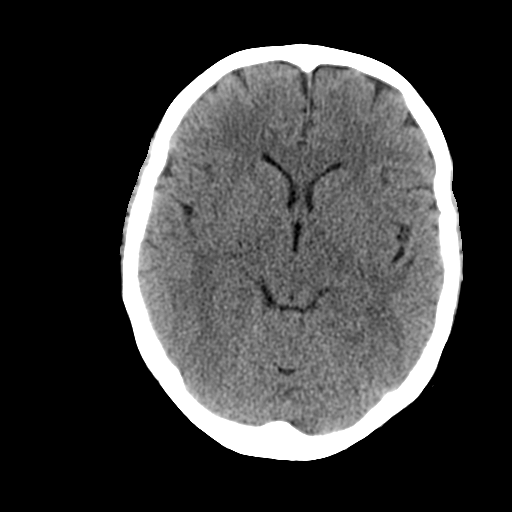
[im 13/30  brain]
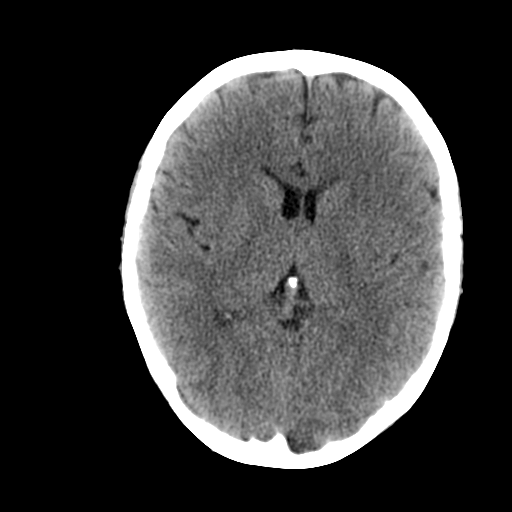
[im 15/30  brain]
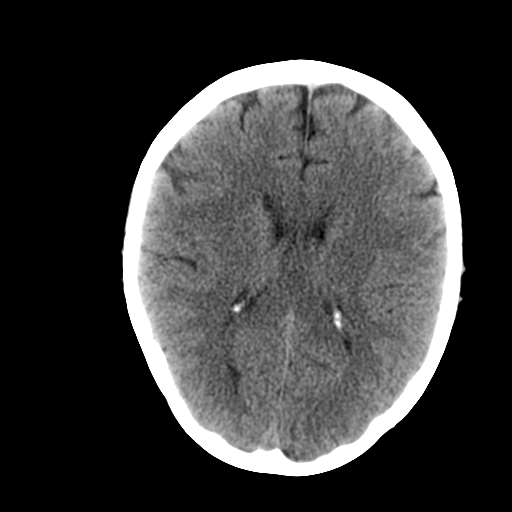
[im 16/30  brain]
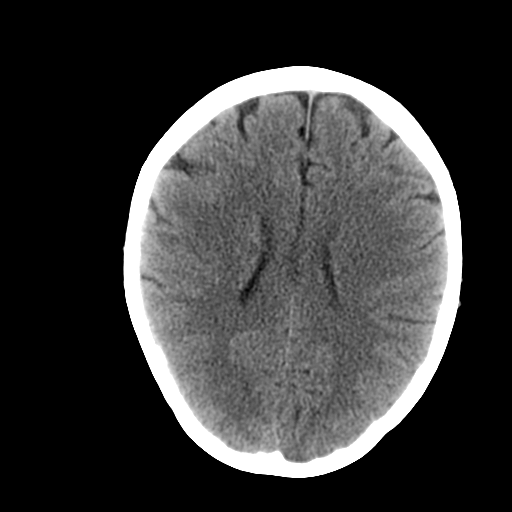
[im 16/30  bone]
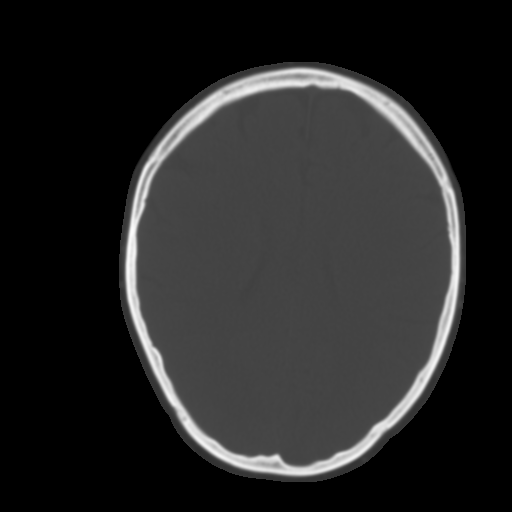
[im 18/30  brain]
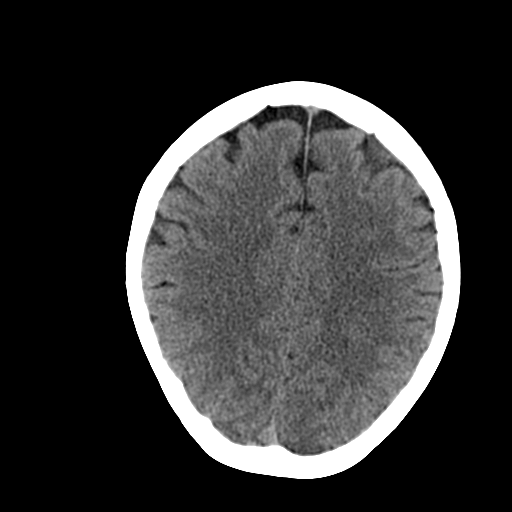
[im 20/30  brain]
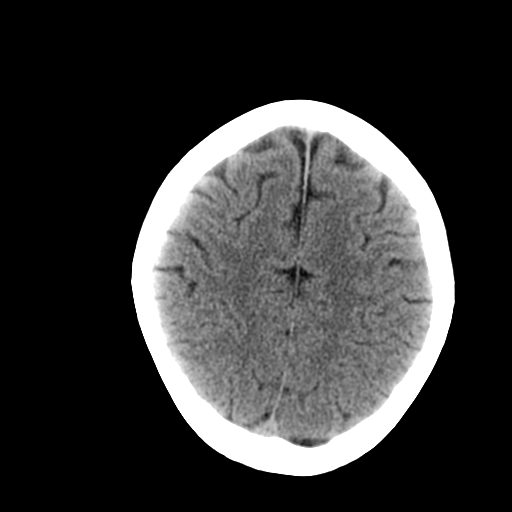
[im 22/30  brain]
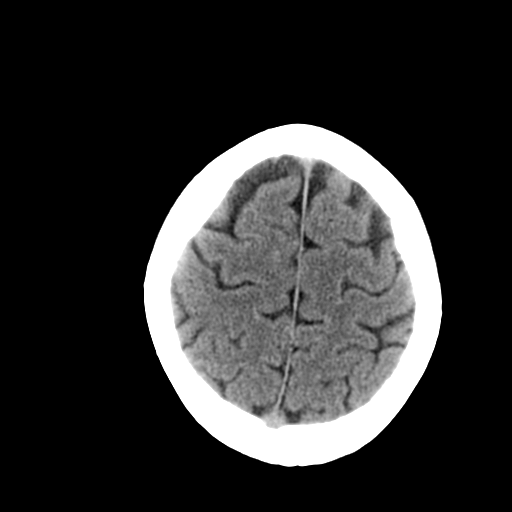
[im 23/30  brain]
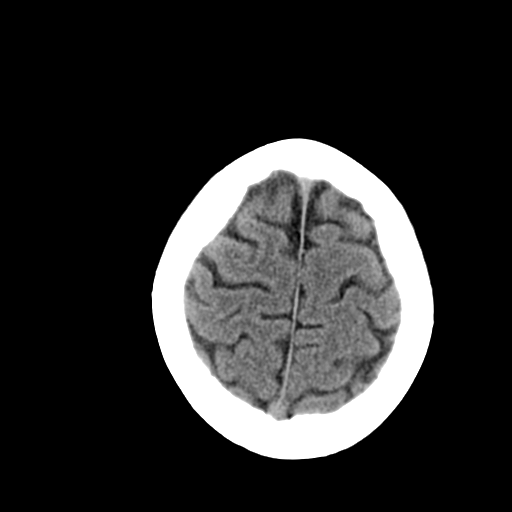
[im 23/30  bone]
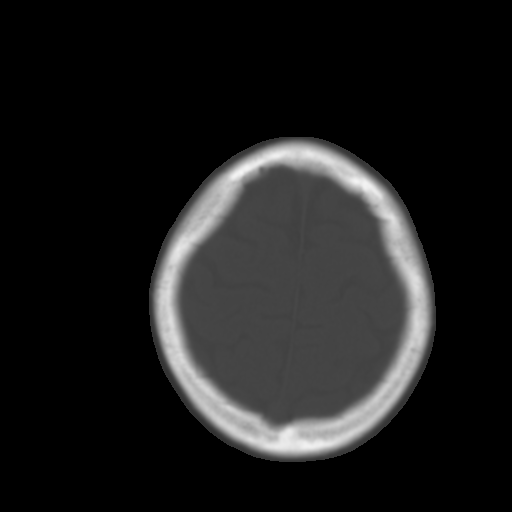
[im 25/30  brain]
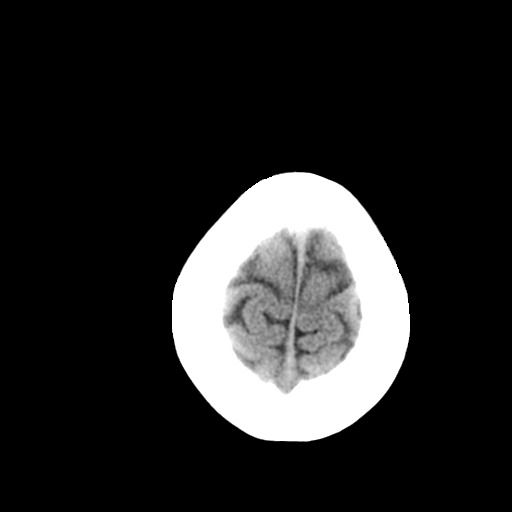
[im 27/30  brain]
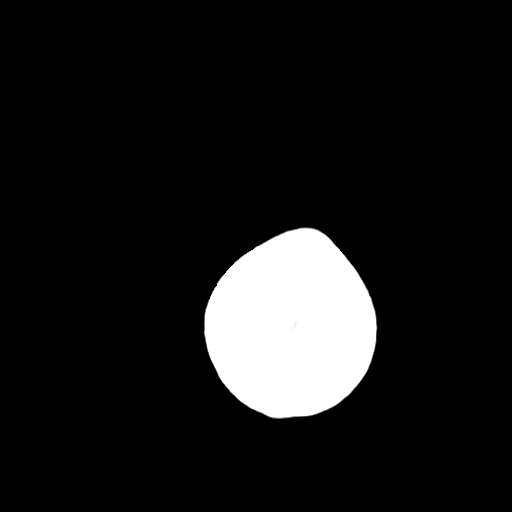
[im 29/30  brain]
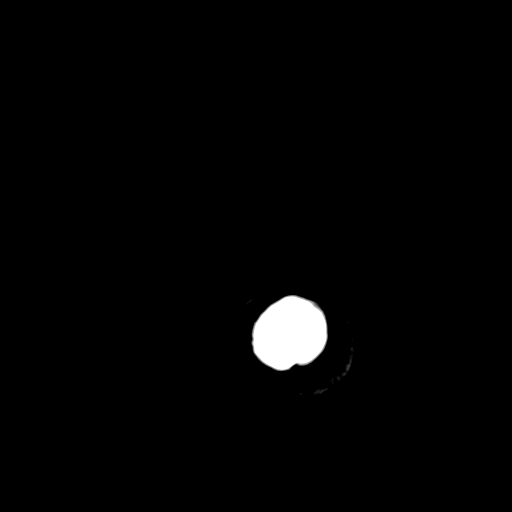

[16 of 30 positions shown; findings below may reference images not displayed]

FINDINGS: Ventricle size is normal.  Negative for acute or chronic
infarct.  Negative for hemorrhage or mass.

Sinusitis with air-fluid levels in the left maxillary and sphenoid
sinuses.  Diffuse mucosal thickening in the ethmoid sinuses.
IMPRESSION: Normal CT of the brain.

Sinusitis with air-fluid levels.

## 2012-07-19 IMAGING — CR DG CHEST 2V
2 series · 2 of 2 positions shown · non-contrast
Comparison: None.

CLINICAL DATA: Headache, cough

CHEST - 2 VIEW

[view not recorded (1 of 2)]
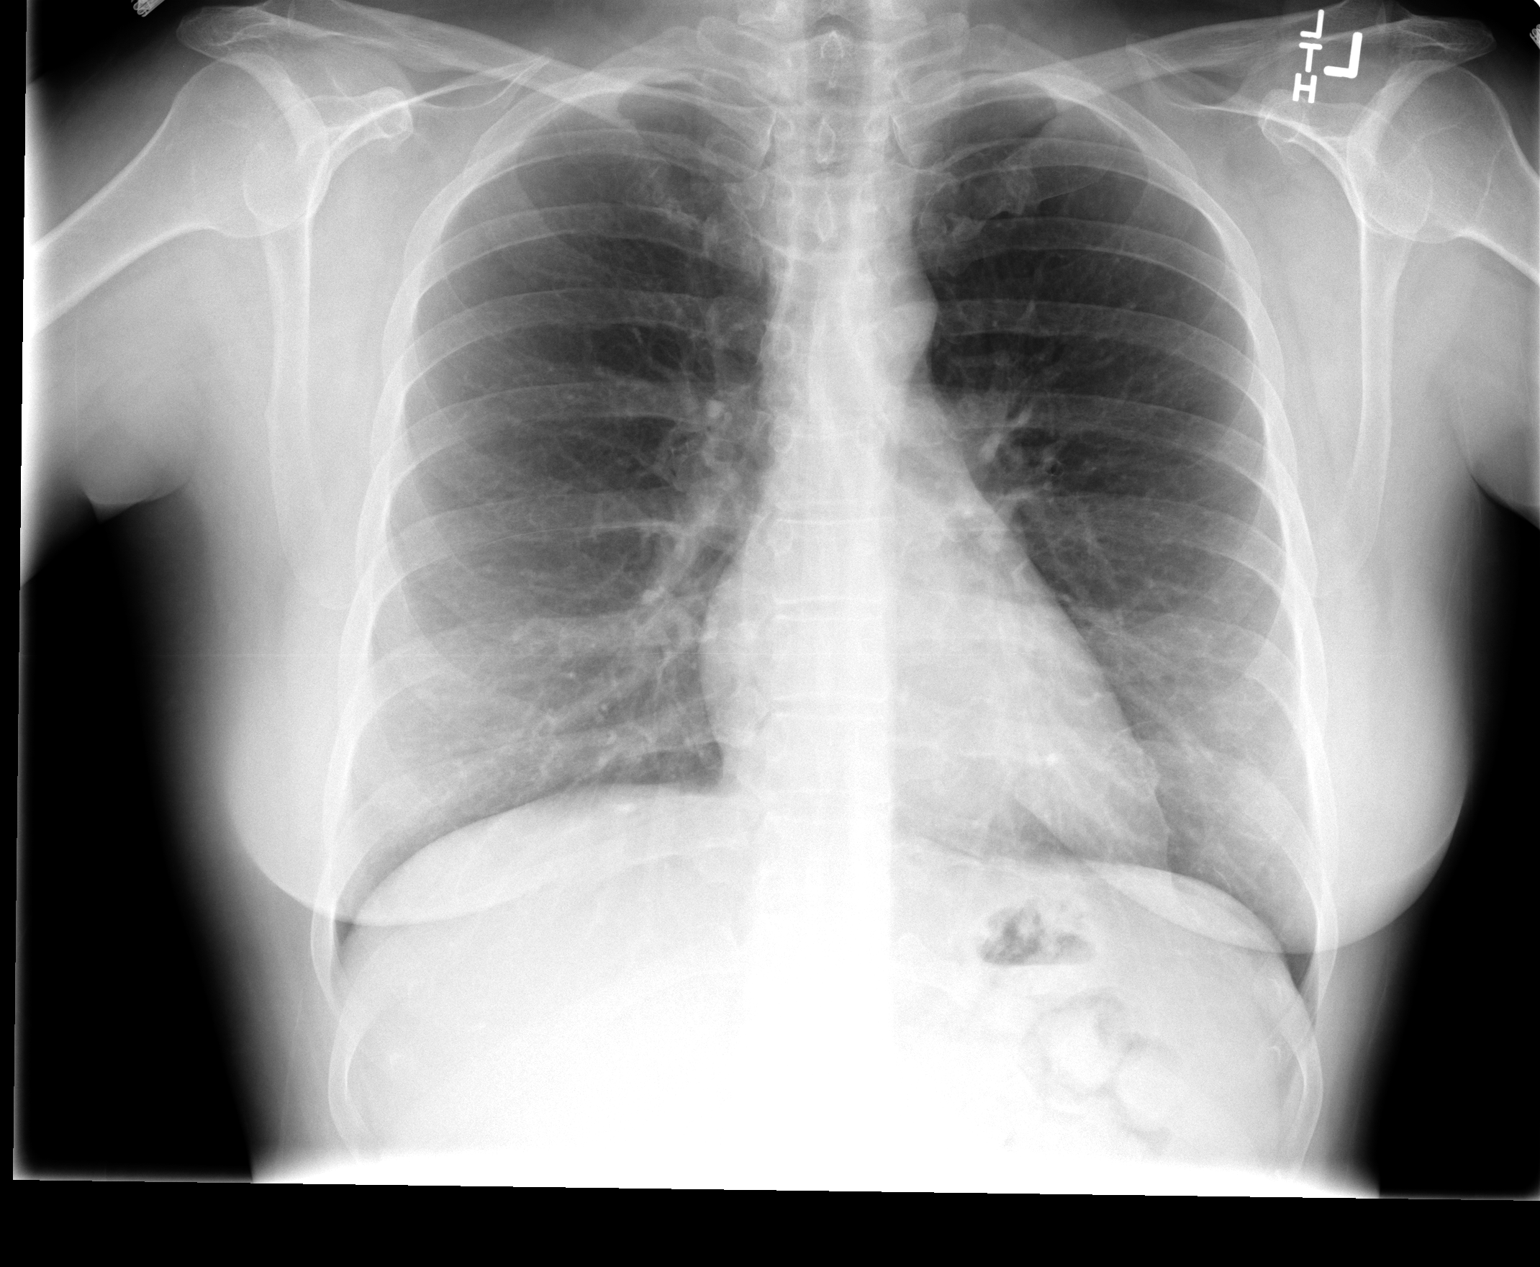

[view not recorded (2 of 2)]
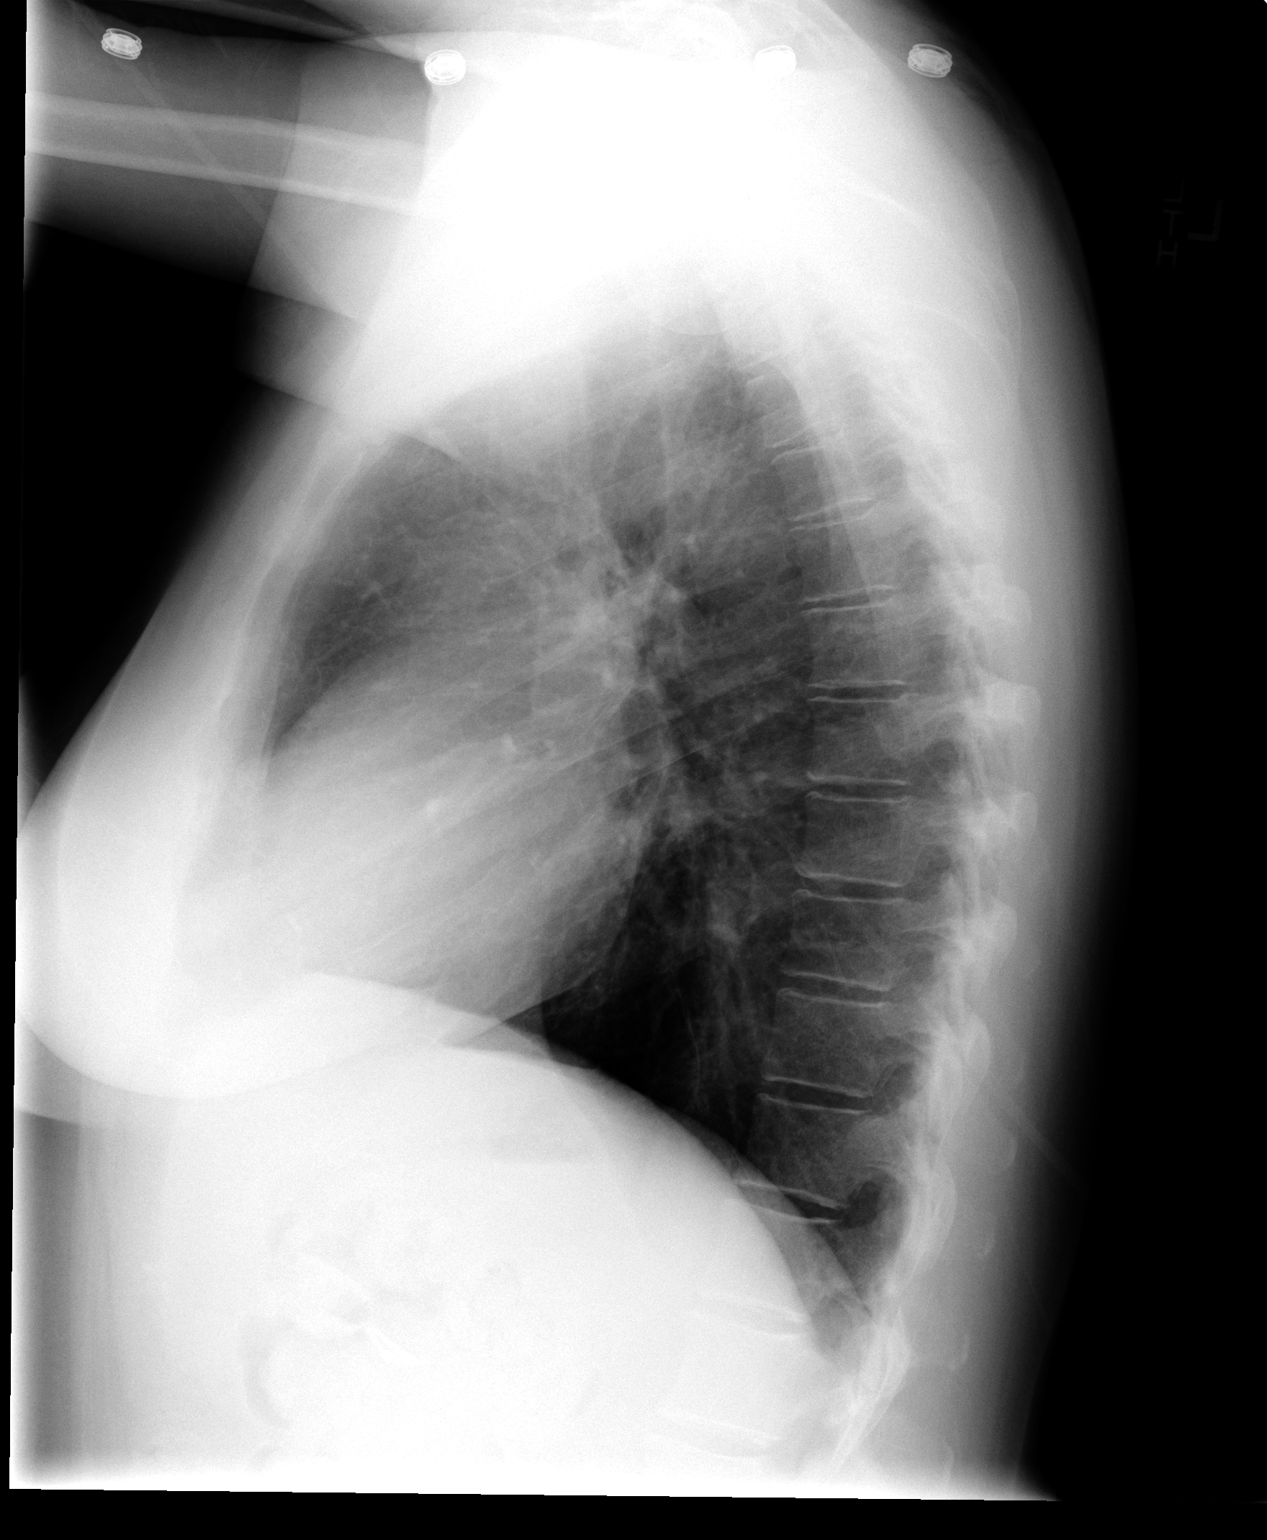

[2 of 2 positions shown; findings below may reference images not displayed]

FINDINGS: Lungs are clear. No pleural effusion or pneumothorax.

Cardiomediastinal silhouette is within normal limits.

Visualized osseous structures are within normal limits.
IMPRESSION: No evidence of acute cardiopulmonary disease.

## 2012-07-19 MED ORDER — HYDROCODONE-ACETAMINOPHEN 5-325 MG PO TABS: 1.0000 | ORAL_TABLET | Freq: Four times a day (QID) | ORAL | Status: DC | PRN

## 2012-07-19 MED ORDER — HYDROMORPHONE HCL PF 1 MG/ML IJ SOLN
1.0000 mg | Freq: Once | INTRAMUSCULAR | Status: AC
  Administered 2012-07-19: 1 mg via INTRAVENOUS
  Filled 2012-07-19: qty 1

## 2012-07-19 MED ORDER — SODIUM CHLORIDE 0.9 % IV BOLUS (SEPSIS)
1000.0000 mL | Freq: Once | INTRAVENOUS | Status: AC
  Administered 2012-07-19: 1000 mL via INTRAVENOUS

## 2012-07-19 MED ORDER — SULFAMETHOXAZOLE-TRIMETHOPRIM 800-160 MG PO TABS: 1.0000 | ORAL_TABLET | Freq: Two times a day (BID) | ORAL | Status: DC

## 2012-07-19 MED ORDER — ONDANSETRON HCL 4 MG/2ML IJ SOLN
4.0000 mg | Freq: Once | INTRAMUSCULAR | Status: AC
  Administered 2012-07-19: 4 mg via INTRAVENOUS
  Filled 2012-07-19: qty 2

## 2012-07-19 MED ORDER — SODIUM CHLORIDE 0.9 % IV SOLN
INTRAVENOUS | Status: DC
  Administered 2012-07-19: 18:00:00 via INTRAVENOUS

## 2012-07-19 NOTE — ED Notes (Signed)
Pt alert & oriented x4, stable gait. Patient given discharge instructions, paperwork & prescription(s). Patient  instructed to stop at the registration desk to finish any additional paperwork. Patient verbalized understanding. Pt left department w/ no further questions. 

## 2012-07-19 NOTE — ED Provider Notes (Signed)
History  This chart was scribed for Monica Jakes, MD by [name], ED Scribe. This patient was seen in room APA03/APA03 and the patient's care was started at 17:12.   CSN: [id number]  Arrival date & time 07/19/12  1528   First MD Initiated Contact with Patient 07/19/12 1712      Chief Complaint  Patient presents with  . Migraine    (Consider location/radiation/quality/duration/timing/severity/associated sxs/prior treatment) The history is provided by the patient. No language interpreter was used.  [name] is a 42 y.o. female who presents to the Emergency Department complaining of a sudden onset headache in the forehead area bilaterally since waking around 6:30am this morning.  Pt has a h/o migraines but reports this episode is different because it includes a change in ability to smell, taste, and hear out of the left ear. Pt has had a head cold for the past 2 days. She reports some associated nausea, photophobia, nonproductive cough, chest soreness, rhinorrhea with thick green mucus, and an intermittent fever (around 101-102, managed with aspirin). She denies any emesis, diarrhea, difficulty moving, abdominal pain, chest pain, dysuria, rash, neck pain, back pain, or h/o bleeding easily. Pt used to take imitrex for her migraines, but she hasn't had a bad one in like this 2 years.  Pt has no PCP.  Past Medical History  Diagnosis Date  . Hypertension   . Migraine     Past Surgical History  Procedure Laterality Date  . Abdominal hysterectomy    . Cholecystectomy    . Hand surgery      No family history on file.  History  Substance Use Topics  . Smoking status: Current Every Day Smoker    Types: Cigarettes  . Smokeless tobacco: Not on file  . Alcohol Use: No    OB History   Grav Para Term Preterm Abortions TAB SAB Ect Mult Living                  Review of Systems  Constitutional: Positive for fever.  HENT: Positive for hearing loss, congestion and  rhinorrhea. Negative for neck pain.   Eyes: Positive for photophobia.  Respiratory: Positive for cough and chest tightness.   Cardiovascular: Negative for chest pain.  Gastrointestinal: Positive for nausea. Negative for vomiting, abdominal pain and diarrhea.  Genitourinary: Negative for dysuria.  Musculoskeletal: Negative for back pain.  Skin: Negative for rash.  Neurological: Positive for headaches.  Hematological: Does not bruise/bleed easily.  All other systems reviewed and are negative.    Allergies  Ibuprofen; Naproxen; and Penicillins  Home Medications   Current Outpatient Rx  Name  Route  Sig  Dispense  Refill  . Aspirin-Salicylamide-Caffeine (BC HEADACHE) 325-95-16 MG TABS   Oral   Take 1 packet by mouth 2 (two) times daily as needed (for pain). FOR PAIN         . HYDROcodone-acetaminophen (NORCO/VICODIN) 5-325 MG per tablet   Oral   Take 1-2 tablets by mouth every 6 (six) hours as needed for pain.   14 tablet   0   . sulfamethoxazole-trimethoprim (SEPTRA DS) 800-160 MG per tablet   Oral   Take 1 tablet by mouth every 12 (twelve) hours.   20 tablet   0     Triage Vitals: BP 150/82  Pulse 104  Temp(Src) 98.2 F (36.8 C) (Oral)  Resp 20  Ht 5\' 1"  (1.549 m)  Wt 140 lb (63.504 kg)  BMI 26.47 kg/m2  SpO2 100%  Physical Exam  Nursing note and vitals reviewed. Constitutional: She is oriented to person, place, and time. She appears well-developed and well-nourished. No distress.  HENT:  Head: Normocephalic and atraumatic.  Right Ear: External ear normal.  Left Ear: External ear normal.  Mouth/Throat: Oropharynx is clear and moist. No oropharyngeal exudate.  Right and left TMs are mildly erythematous with no bulging.  Eyes: Conjunctivae and EOM are normal. Pupils are equal, round, and reactive to light.  Neck: Neck supple. No tracheal deviation present.  Cardiovascular: Normal rate, regular rhythm and normal heart sounds.   No murmur  heard. Pulmonary/Chest: Effort normal. No respiratory distress. She has no wheezes.  Lungs are clear.  Abdominal: Soft. Bowel sounds are normal. She exhibits no distension. There is no tenderness.  Musculoskeletal: Normal range of motion. She exhibits no edema.  Lymphadenopathy:    She has no cervical adenopathy.  Neurological: She is alert and oriented to person, place, and time. No cranial nerve deficit. Coordination normal.  Skin: Skin is warm and dry.  Psychiatric: She has a normal mood and affect.    ED Course  Procedures (including critical care time) DIAGNOSTIC STUDIES: Oxygen Saturation is 100% on room air, normal by my interpretation.    COORDINATION OF CARE: 17:24--I evaluated the patient and we discussed a treatment plan including IV fluids, basic labs, head CT, and pain medication to which the pt agreed.  Results for orders placed during the hospital encounter of 07/19/12  CBC WITH DIFFERENTIAL      Result Value Range   WBC 6.2  4.0 - 10.5 K/uL   RBC 4.05  3.87 - 5.11 MIL/uL   Hemoglobin 12.6  12.0 - 15.0 g/dL   HCT 16.1  09.6 - 04.5 %   MCV 92.3  78.0 - 100.0 fL   MCH 31.1  26.0 - 34.0 pg   MCHC 33.7  30.0 - 36.0 g/dL   RDW 40.9  81.1 - 91.4 %   Platelets 330  150 - 400 K/uL   Neutrophils Relative 58  43 - 77 %   Neutro Abs 3.6  1.7 - 7.7 K/uL   Lymphocytes Relative 31  12 - 46 %   Lymphs Abs 1.9  0.7 - 4.0 K/uL   Monocytes Relative 8  3 - 12 %   Monocytes Absolute 0.5  0.1 - 1.0 K/uL   Eosinophils Relative 3  0 - 5 %   Eosinophils Absolute 0.2  0.0 - 0.7 K/uL   Basophils Relative 1  0 - 1 %   Basophils Absolute 0.0  0.0 - 0.1 K/uL  BASIC METABOLIC PANEL      Result Value Range   Sodium 136  135 - 145 mEq/L   Potassium 4.0  3.5 - 5.1 mEq/L   Chloride 103  96 - 112 mEq/L   CO2 21  19 - 32 mEq/L   Glucose, Bld 107 (*) 70 - 99 mg/dL   BUN 9  6 - 23 mg/dL   Creatinine, Ser 7.82  0.50 - 1.10 mg/dL   Calcium 8.6  8.4 - 95.6 mg/dL   GFR calc non Af Amer 89  (*) >90 mL/min   GFR calc Af Amer >90  >90 mL/min   Dg Chest 2 View  07/19/2012  *RADIOLOGY REPORT*  Clinical Data: Headache, cough  CHEST - 2 VIEW  Comparison: None.  Findings: Lungs are clear. No pleural effusion or pneumothorax.  Cardiomediastinal silhouette is within normal limits.  Visualized osseous structures are within  normal limits.  IMPRESSION: No evidence of acute cardiopulmonary disease.   Original Report Authenticated By: Charline Bills, M.D.    Ct Head Wo Contrast  07/19/2012  *RADIOLOGY REPORT*  Clinical Data: Headache  CT HEAD WITHOUT CONTRAST  Technique:  Contiguous axial images were obtained from the base of the skull through the vertex without contrast.  Comparison: None.  Findings: Ventricle size is normal.  Negative for acute or chronic infarct.  Negative for hemorrhage or mass.  Sinusitis with air-fluid levels in the left maxillary and sphenoid sinuses.  Diffuse mucosal thickening in the ethmoid sinuses.  IMPRESSION: Normal CT of the brain.  Sinusitis with air-fluid levels.   Original Report Authenticated By: Janeece Riggers, M.D.       1. Sinusitis, acute maxillary   2. Sinusitis, acute, sphenoidal       MDM  CT consistent with left-sided maxillary and sphenoid sinusitis most likely explaining the patient's symptoms. Headache is not typical for a migraine for her. Patient's pain is improved in the emergency department. Will be treated with Septra DS for the next 10 days. Chest x-ray was negative for pneumonia. Symptoms probably were initially 2 to a viral upper respiratory illness. Patient will return for any newer worse symptoms.      I personally performed the services described in this documentation, which was scribed in my presence. The recorded information has been reviewed and is accurate.     Monica Jakes, MD 07/19/12 [phone]

## 2012-07-19 NOTE — ED Notes (Signed)
Pt reports migraine since 6am today. Has been battling them since a young child, +nausea. No vomiting. Has been having sinus congestion and thinks mat be related

## 2012-07-19 NOTE — ED Notes (Signed)
Pt presents to er with c/o migraine headache that started this am, pt has hx of migraine headaches but states that this one is different because she is not able to smell, taste or hear out of left ear, pt also reports that she has had problems with a head cold this week, pain is associated with nausea, light sensitivity.

## 2012-07-19 NOTE — ED Notes (Signed)
Pt stated meds have helped her some.

## 2012-07-19 NOTE — ED Notes (Signed)
RN at bedside

## 2012-09-01 ENCOUNTER — Emergency Department (HOSPITAL_COMMUNITY)
Admission: EM | Admit: 2012-09-01 | Discharge: 2012-09-01 | Disposition: A | Discharge status: Home / Self Care | Payer: Self-pay | Attending: Emergency Medicine | Admitting: Emergency Medicine

## 2012-09-01 ENCOUNTER — Encounter (HOSPITAL_COMMUNITY): Payer: Self-pay | Admitting: *Deleted

## 2012-09-01 DIAGNOSIS — G43909 Migraine, unspecified, not intractable, without status migrainosus: Secondary | ICD-10-CM | POA: Insufficient documentation

## 2012-09-01 DIAGNOSIS — J329 Chronic sinusitis, unspecified: Secondary | ICD-10-CM | POA: Insufficient documentation

## 2012-09-01 DIAGNOSIS — R110 Nausea: Secondary | ICD-10-CM | POA: Insufficient documentation

## 2012-09-01 DIAGNOSIS — H9209 Otalgia, unspecified ear: Secondary | ICD-10-CM | POA: Insufficient documentation

## 2012-09-01 DIAGNOSIS — Z880 Allergy status to penicillin: Secondary | ICD-10-CM | POA: Insufficient documentation

## 2012-09-01 DIAGNOSIS — H538 Other visual disturbances: Secondary | ICD-10-CM | POA: Insufficient documentation

## 2012-09-01 DIAGNOSIS — I10 Essential (primary) hypertension: Secondary | ICD-10-CM | POA: Insufficient documentation

## 2012-09-01 DIAGNOSIS — H53149 Visual discomfort, unspecified: Secondary | ICD-10-CM | POA: Insufficient documentation

## 2012-09-01 MED ORDER — FENTANYL CITRATE 0.05 MG/ML IJ SOLN
100.0000 ug | Freq: Once | INTRAMUSCULAR | Status: AC
  Administered 2012-09-01: 100 ug via INTRAVENOUS
  Filled 2012-09-01: qty 2

## 2012-09-01 MED ORDER — ONDANSETRON HCL 4 MG/2ML IJ SOLN
4.0000 mg | Freq: Once | INTRAMUSCULAR | Status: AC
  Administered 2012-09-01: 4 mg via INTRAVENOUS
  Filled 2012-09-01: qty 2

## 2012-09-01 MED ORDER — FENTANYL CITRATE 0.05 MG/ML IJ SOLN
50.0000 ug | Freq: Once | INTRAMUSCULAR | Status: AC
  Administered 2012-09-01: 50 ug via INTRAVENOUS
  Filled 2012-09-01: qty 2

## 2012-09-01 MED ORDER — SUMATRIPTAN SUCCINATE 6 MG/0.5ML ~~LOC~~ SOLN
6.0000 mg | Freq: Once | SUBCUTANEOUS | Status: AC
  Administered 2012-09-01: 6 mg via SUBCUTANEOUS
  Filled 2012-09-01: qty 0.5

## 2012-09-01 MED ORDER — LEVOFLOXACIN 500 MG PO TABS: 500.0000 mg | ORAL_TABLET | Freq: Every day | ORAL | Status: DC

## 2012-09-01 NOTE — ED Notes (Signed)
Right ear ache x 3 days.  Migraine since this morning with light/sound sensitivity, nausea.  Denies vomiting.  Also c/o visual disturbances to right eye.

## 2012-09-01 NOTE — ED Provider Notes (Signed)
History  This chart was scribed for Monica Shi, MD by Shari Heritage, ED Scribe. The patient was seen in room APA01/APA01. Patient's care was started at 1152.   CSN: [id number]  Arrival date & time 09/01/12  1478   First MD Initiated Contact with Patient 09/01/12 1152      Chief Complaint  Patient presents with  . Migraine  . Otalgia    The history is provided by the patient. No language interpreter was used.    HPI Comments: [name] is a 43 y.o. female with history of hypertension and migraines who presents to the Emergency Department complaining of severe, constant, right frontal headache that began this morning. There is associated photophobia, nausea and blurred vision in right eye. She also complains of persistent right otalgia onset 3 days ago. Patient denies vomiting or any other symptoms at this time. Patient is allergic to ibuprofen, naproxen and penicillins. She is a current every day smoker. Patient does not use alcohol.    Past Medical History  Diagnosis Date  . Hypertension   . Migraine     Past Surgical History  Procedure Laterality Date  . Abdominal hysterectomy    . Cholecystectomy    . Hand surgery      No family history on file.  History  Substance Use Topics  . Smoking status: Current Every Day Smoker    Types: Cigarettes  . Smokeless tobacco: Not on file  . Alcohol Use: No    OB History   Grav Para Term Preterm Abortions TAB SAB Ect Mult Living                  Review of Systems All other systems reviewed and are negative Allergies  Levaquin; Ibuprofen; Naproxen; and Penicillins  Home Medications   Current Outpatient Rx  Name  Route  Sig  Dispense  Refill  . aspirin 325 MG tablet   Oral   Take 650 mg by mouth daily as needed for pain.         Marland Kitchen levofloxacin (LEVAQUIN) 500 MG tablet   Oral   Take 1 tablet (500 mg total) by mouth daily.   10 tablet   0     Triage Vitals: BP 153/74  Pulse 104  Temp(Src) 98.3 F  (36.8 C) (Oral)  Resp 22  Ht 5\' 1"  (1.549 m)  Wt 135 lb (61.236 kg)  BMI 25.52 kg/m2  SpO2 100%  Physical Exam  Nursing note and vitals reviewed. Constitutional: She is oriented to person, place, and time. She appears well-developed and well-nourished. No distress.  HENT:  Head: Normocephalic and atraumatic.  Eyes: Pupils are equal, round, and reactive to light.  Neck: Normal range of motion.  Cardiovascular: Normal rate and intact distal pulses.   Pulmonary/Chest: No respiratory distress.  Abdominal: Normal appearance. She exhibits no distension.  Musculoskeletal: Normal range of motion.  Neurological: She is alert and oriented to person, place, and time. No cranial nerve deficit or sensory deficit. Gait normal. GCS eye subscore is 4. GCS verbal subscore is 5. GCS motor subscore is 6.  Skin: Skin is warm and dry. No rash noted.  Psychiatric: She has a normal mood and affect. Her behavior is normal.    ED Course  Procedures (including critical care time) DIAGNOSTIC STUDIES: Oxygen Saturation is 100% on room air, normal by my interpretation.    COORDINATION OF CARE: 12:01 PM- Patient informed of current plan for treatment and evaluation and agrees with plan  at this time.  Medications  ondansetron (ZOFRAN) injection 4 mg (4 mg Intravenous Given 09/01/12 1208)  fentaNYL (SUBLIMAZE) injection 100 mcg (100 mcg Intravenous Given 09/01/12 1208)  SUMAtriptan (IMITREX) injection 6 mg (6 mg Subcutaneous Given 09/01/12 1207)  fentaNYL (SUBLIMAZE) injection 50 mcg (50 mcg Intravenous Given 09/01/12 1338)     Labs Reviewed - No data to display   No results found.   1. Migraine   2. Sinusitis       MDM  I personally performed the services described in this documentation, which was scribed in my presence. The recorded information has been reviewed and considered.    Monica Shi, MD 09/18/12 [phone]

## 2012-09-03 NOTE — ED Notes (Signed)
Patient called 09/03/12, stated she is itching and has hives from the Leviquin prescribed on 4/21 for sinusitis. Spoke with Dr Adriana Simas, Z-pak called in to [facility]  [phone].

## 2013-03-24 ENCOUNTER — Emergency Department (HOSPITAL_COMMUNITY)
Admission: EM | Admit: 2013-03-24 | Discharge: 2013-03-24 | Disposition: A | Discharge status: Home / Self Care | Payer: Self-pay | Attending: Emergency Medicine | Admitting: Emergency Medicine

## 2013-03-24 ENCOUNTER — Encounter (HOSPITAL_COMMUNITY): Payer: Self-pay | Admitting: Emergency Medicine

## 2013-03-24 ENCOUNTER — Emergency Department (HOSPITAL_COMMUNITY): Payer: Self-pay

## 2013-03-24 DIAGNOSIS — K5900 Constipation, unspecified: Secondary | ICD-10-CM | POA: Insufficient documentation

## 2013-03-24 DIAGNOSIS — Z7982 Long term (current) use of aspirin: Secondary | ICD-10-CM | POA: Insufficient documentation

## 2013-03-24 DIAGNOSIS — Z880 Allergy status to penicillin: Secondary | ICD-10-CM | POA: Insufficient documentation

## 2013-03-24 DIAGNOSIS — I10 Essential (primary) hypertension: Secondary | ICD-10-CM | POA: Insufficient documentation

## 2013-03-24 DIAGNOSIS — M545 Low back pain, unspecified: Secondary | ICD-10-CM | POA: Insufficient documentation

## 2013-03-24 DIAGNOSIS — F17200 Nicotine dependence, unspecified, uncomplicated: Secondary | ICD-10-CM | POA: Insufficient documentation

## 2013-03-24 DIAGNOSIS — Z792 Long term (current) use of antibiotics: Secondary | ICD-10-CM | POA: Insufficient documentation

## 2013-03-24 DIAGNOSIS — R300 Dysuria: Secondary | ICD-10-CM | POA: Insufficient documentation

## 2013-03-24 DIAGNOSIS — R1084 Generalized abdominal pain: Secondary | ICD-10-CM | POA: Insufficient documentation

## 2013-03-24 DIAGNOSIS — G8929 Other chronic pain: Secondary | ICD-10-CM | POA: Insufficient documentation

## 2013-03-24 DIAGNOSIS — N281 Cyst of kidney, acquired: Secondary | ICD-10-CM | POA: Insufficient documentation

## 2013-03-24 DIAGNOSIS — R109 Unspecified abdominal pain: Secondary | ICD-10-CM

## 2013-03-24 DIAGNOSIS — R110 Nausea: Secondary | ICD-10-CM | POA: Insufficient documentation

## 2013-03-24 HISTORY — DX: Other chronic pain: G89.29

## 2013-03-24 HISTORY — DX: Dorsalgia, unspecified: M54.9

## 2013-03-24 LAB — WET PREP, GENITAL
Clue Cells Wet Prep HPF POC: NONE SEEN
Trich, Wet Prep: NONE SEEN
Yeast Wet Prep HPF POC: NONE SEEN

## 2013-03-24 LAB — COMPREHENSIVE METABOLIC PANEL
ALT: 8 U/L (ref 0–35)
AST: 17 U/L (ref 0–37)
Albumin: 3.6 g/dL (ref 3.5–5.2)
Alkaline Phosphatase: 90 U/L (ref 39–117)
BUN: 9 mg/dL (ref 6–23)
CO2: 24 mEq/L (ref 19–32)
Calcium: 9.6 mg/dL (ref 8.4–10.5)
Chloride: 105 mEq/L (ref 96–112)
Creatinine, Ser: 0.97 mg/dL (ref 0.50–1.10)
GFR calc Af Amer: 82 mL/min — ABNORMAL LOW (ref >90)
GFR calc non Af Amer: 71 mL/min — ABNORMAL LOW (ref >90)
Glucose, Bld: 91 mg/dL (ref 70–99)
Potassium: 4.3 mEq/L (ref 3.5–5.1)
Sodium: 138 mEq/L (ref 135–145)
Total Bilirubin: <0.1 mg/dL — ABNORMAL LOW (ref 0.3–1.2)
Total Protein: 7.4 g/dL (ref 6.0–8.3)

## 2013-03-24 LAB — CBC WITH DIFFERENTIAL/PLATELET
Basophils Absolute: 0 10*3/uL (ref 0.0–0.1)
Basophils Relative: 0 % (ref 0–1)
Eosinophils Absolute: 0.3 10*3/uL (ref 0.0–0.7)
Eosinophils Relative: 4 % (ref 0–5)
HCT: 39.7 % (ref 36.0–46.0)
Hemoglobin: 13.4 g/dL (ref 12.0–15.0)
Lymphocytes Relative: 43 % (ref 12–46)
Lymphs Abs: 3 10*3/uL (ref 0.7–4.0)
MCH: 32.8 pg (ref 26.0–34.0)
MCHC: 33.8 g/dL (ref 30.0–36.0)
MCV: 97.3 fL (ref 78.0–100.0)
Monocytes Absolute: 0.7 10*3/uL (ref 0.1–1.0)
Monocytes Relative: 10 % (ref 3–12)
Neutro Abs: 3.1 10*3/uL (ref 1.7–7.7)
Neutrophils Relative %: 43 % (ref 43–77)
Platelets: 307 10*3/uL (ref 150–400)
RBC: 4.08 MIL/uL (ref 3.87–5.11)
RDW: 13.6 % (ref 11.5–15.5)
WBC: 7.1 10*3/uL (ref 4.0–10.5)

## 2013-03-24 LAB — URINALYSIS, ROUTINE W REFLEX MICROSCOPIC
Bilirubin Urine: NEGATIVE
Glucose, UA: NEGATIVE mg/dL
Hgb urine dipstick: NEGATIVE
Ketones, ur: NEGATIVE mg/dL
Leukocytes, UA: NEGATIVE
Nitrite: NEGATIVE
Protein, ur: NEGATIVE mg/dL
Specific Gravity, Urine: 1.025 (ref 1.005–1.030)
Urobilinogen, UA: 0.2 mg/dL (ref 0.0–1.0)
pH: 6 (ref 5.0–8.0)

## 2013-03-24 LAB — LIPASE, BLOOD: Lipase: 25 U/L (ref 11–59)

## 2013-03-24 IMAGING — CT CT ABD-PELV W/ CM
2 of 5 series · 16 of 46 positions shown, 18 images · IV contrast (Omnipaque 300)
Comparison: Multiple exams, including [DATE]

CLINICAL DATA: Low abdominal pain and low back pain for the past 10
days.

EXAM:
CT ABDOMEN AND PELVIS WITH CONTRAST
TECHNIQUE: Multidetector CT imaging of the abdomen and pelvis was performed
using the standard protocol following bolus administration of
intravenous contrast.
CONTRAST:  50mL OMNIPAQUE IOHEXOL 300 MG/ML SOLN, 100mL OMNIPAQUE
IOHEXOL 300 MG/ML SOLN

[Series 2: abd_pel_with 5.0 b40f · axial · 0.62mm/px · z∈[-422,-66]mm · 13 of 81 slices shown, 15 images]
[im 5/81  soft-tissue]
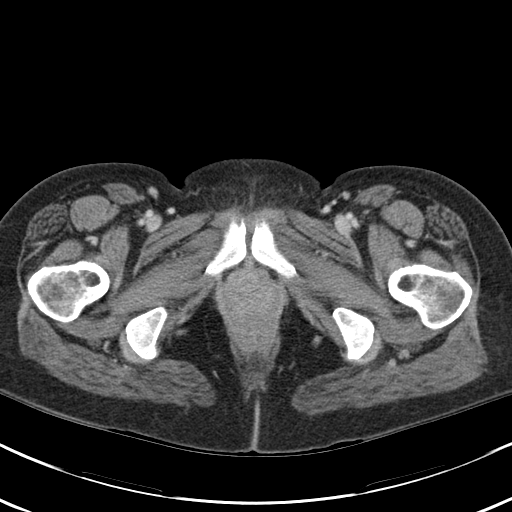
[im 5/81  bone]
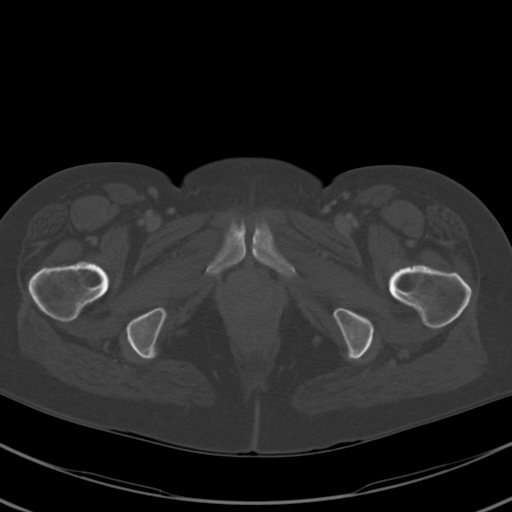
[im 10/81  soft-tissue]
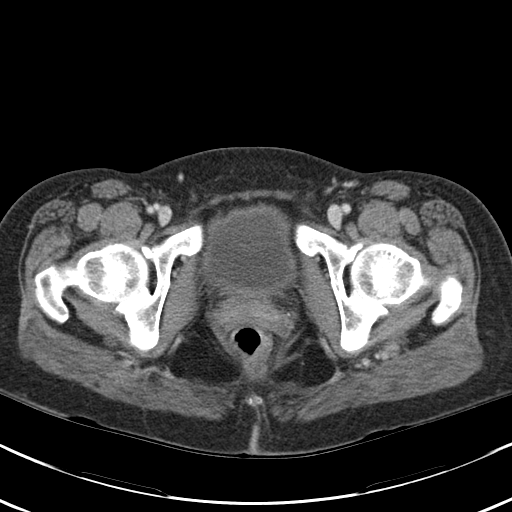
[im 19/81  soft-tissue]
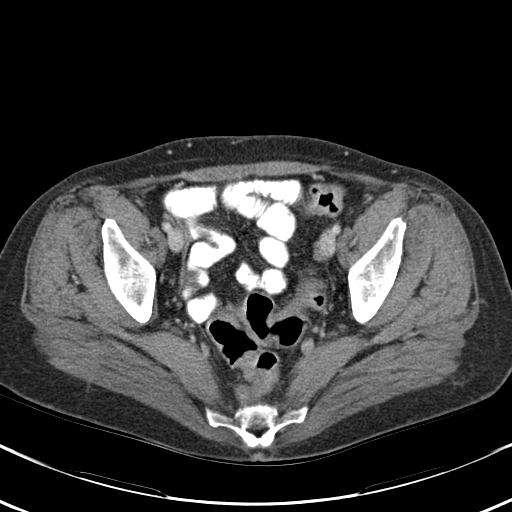
[im 24/81  soft-tissue]
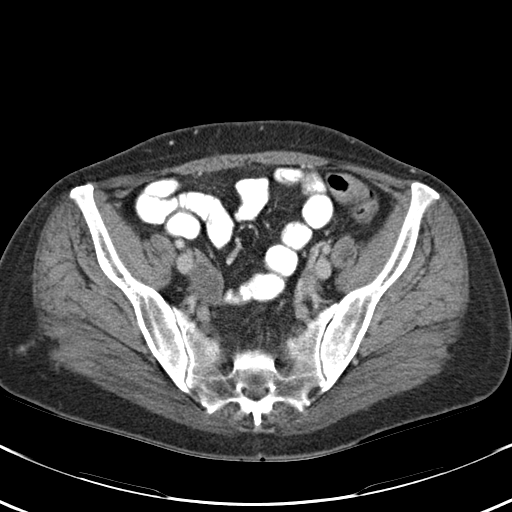
[im 29/81  soft-tissue]
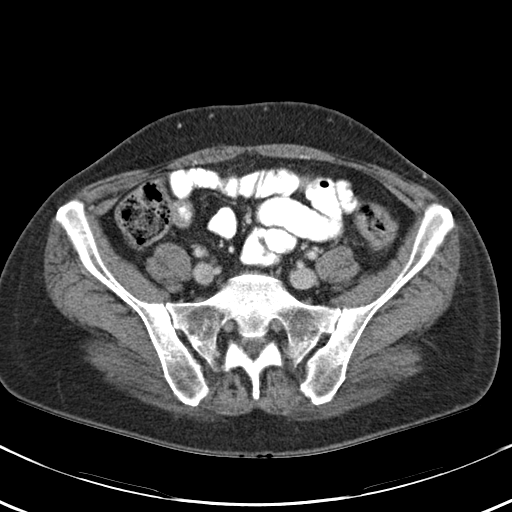
[im 33/81  soft-tissue]
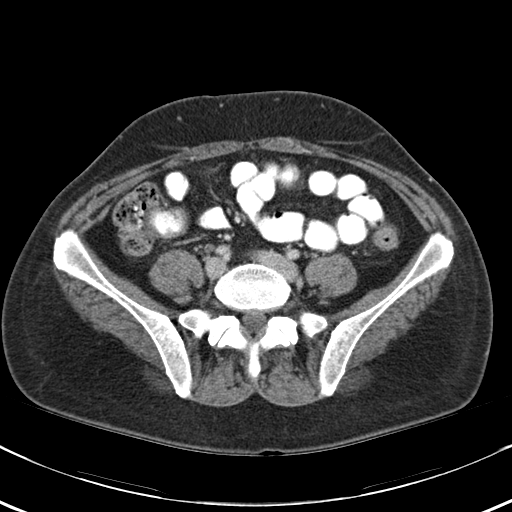
[im 43/81  soft-tissue]
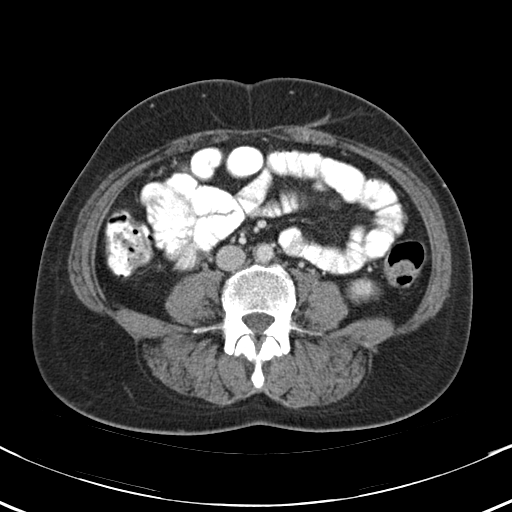
[im 48/81  soft-tissue]
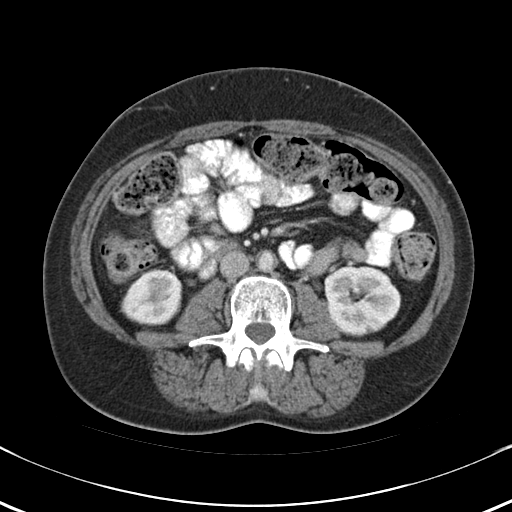
[im 52/81  soft-tissue]
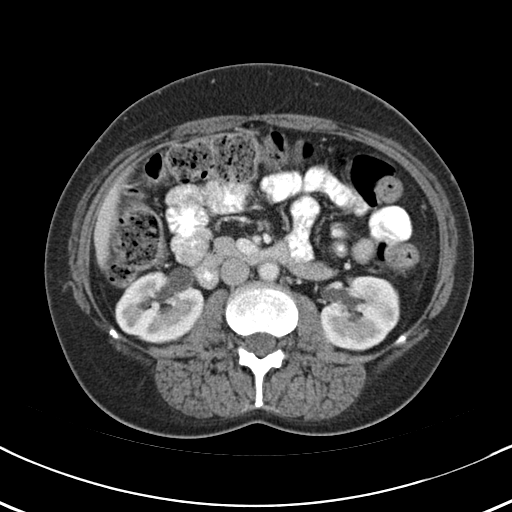
[im 52/81  bone]
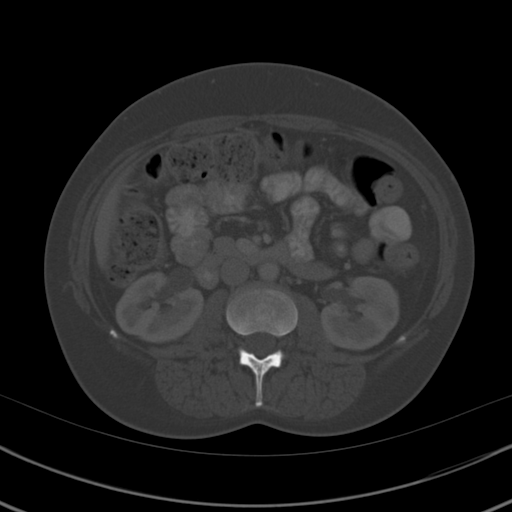
[im 57/81  soft-tissue]
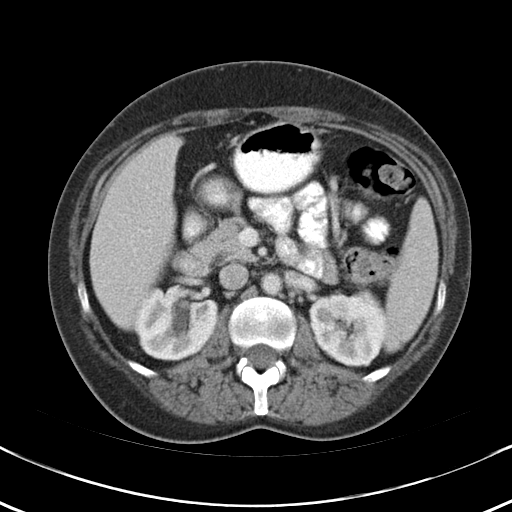
[im 62/81  soft-tissue]
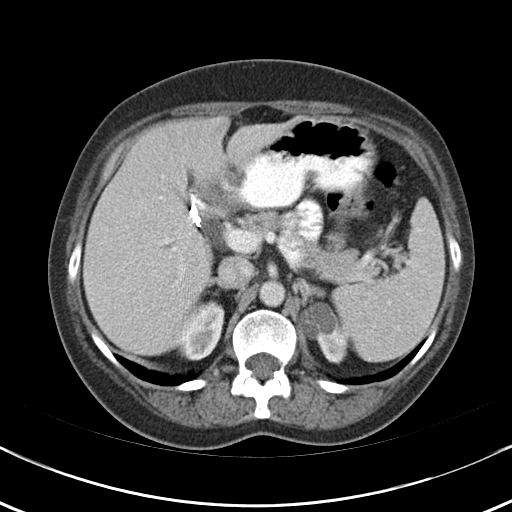
[im 71/81  soft-tissue]
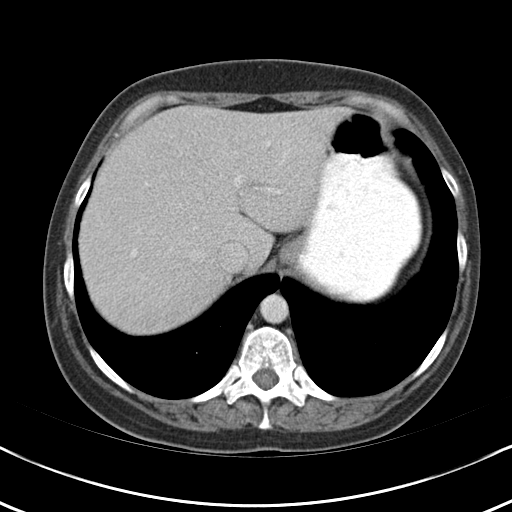
[im 76/81  soft-tissue]
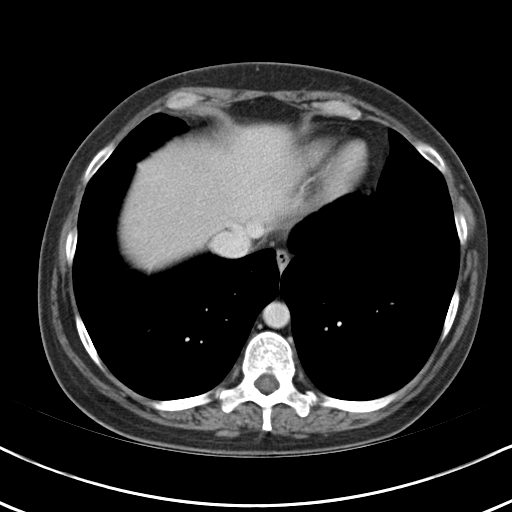

[Series 4: abd_pel_with 3.0 spo cor · coronal · 0.59mm/px · 3 of 70 slices shown]
[im 24/70  soft-tissue]
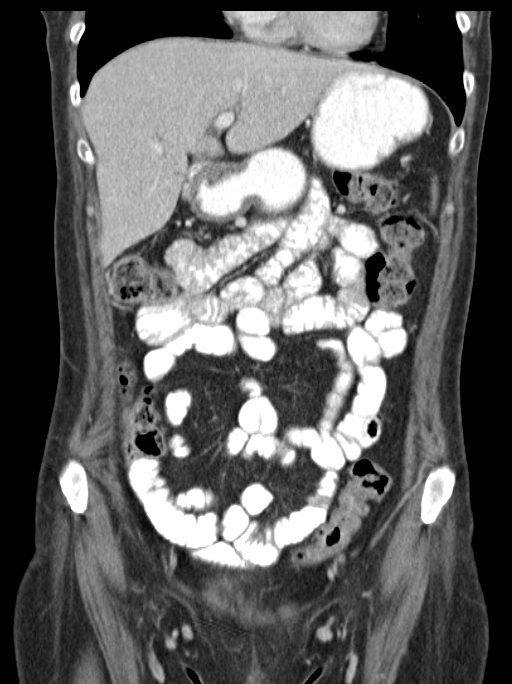
[im 31/70  soft-tissue]
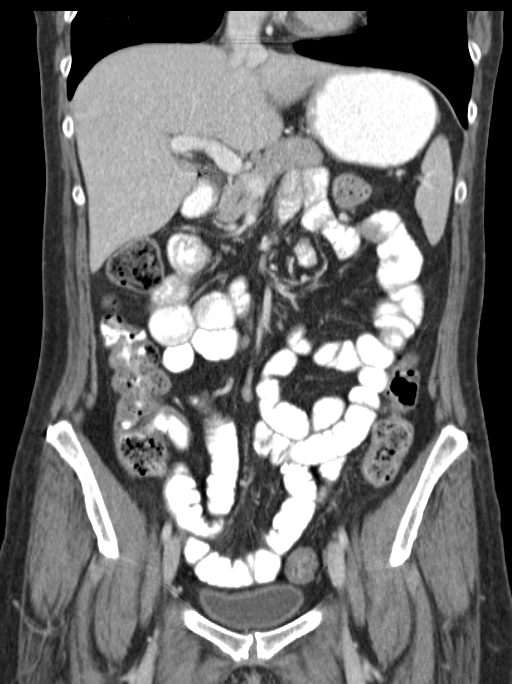
[im 39/70  soft-tissue]
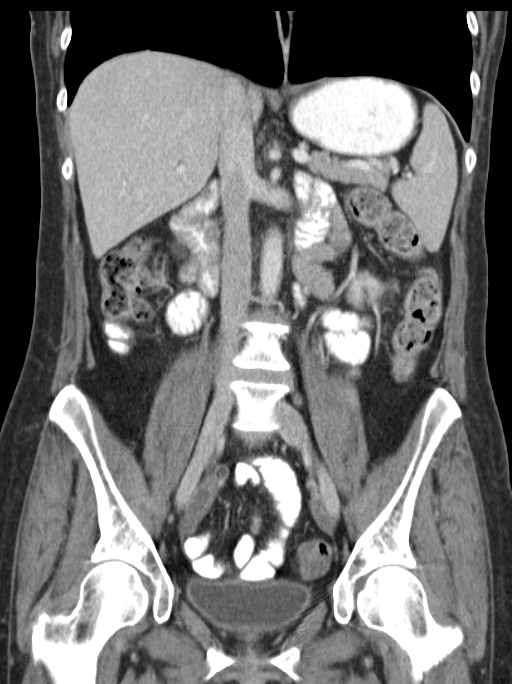

[16 of 46 positions shown; findings below may reference images not displayed]

FINDINGS: The liver, spleen, pancreas, and adrenal glands appear unremarkable.
Common bile duct measures to 11 mm in diameter, possibly physiologic
response to cholecystectomy.

New 2.0 x 2.3 cm exophytic cyst from the left kidney upper pole
appears to have a single internal septation, and is accordingly a
Bosniak category 2.

Mild caliectasis on the right with normal caliber ureter, query low
grade partial UPJ narrowing.

Appendix normal. Ovarian contours normal. Uterus absent. Urinary
bladder normal.

No overt small bowel dilatation. Prominent stool throughout the
colon favors constipation.

No pathologic upper abdominal adenopathy is observed. No pathologic
pelvic adenopathy is observed. Central disc protrusion at L4-5.
IMPRESSION: 1. New Bosniak category 2 cyst (complex, but benign) exophytic from
the left kidney upper pole.
2. Suspected low grade chronic right UPJ narrowing given the mild
caliectasis on the right. No stone observed.
3.  Prominent stool throughout the colon favors constipation.
4. Central disc protrusion at L4-5.

## 2013-03-24 MED ORDER — FENTANYL CITRATE 0.05 MG/ML IJ SOLN
50.0000 ug | INTRAMUSCULAR | Status: DC | PRN
  Administered 2013-03-24: 50 ug via INTRAVENOUS
  Filled 2013-03-24: qty 2

## 2013-03-24 MED ORDER — SODIUM CHLORIDE 0.9 % IV SOLN
INTRAVENOUS | Status: DC
  Administered 2013-03-24: 16:00:00 via INTRAVENOUS

## 2013-03-24 MED ORDER — IOHEXOL 300 MG/ML  SOLN
100.0000 mL | Freq: Once | INTRAMUSCULAR | Status: AC | PRN
  Administered 2013-03-24: 100 mL via INTRAVENOUS

## 2013-03-24 MED ORDER — IOHEXOL 300 MG/ML  SOLN
50.0000 mL | Freq: Once | INTRAMUSCULAR | Status: AC | PRN
  Administered 2013-03-24: 50 mL via ORAL

## 2013-03-24 NOTE — ED Notes (Addendum)
Pt c/o lower abd pain, bilateral lower back pain that started a few days ago, was seen by pcp on Friday, diagnosed with uti and ?kidney stone, placed on pyridum, septra ds, pt states that she is not getting any better, admits to fever and nausea. Pt also concerned with swelling to vaginal area.

## 2013-03-24 NOTE — ED Notes (Signed)
Pt states her abdominal pain has increased to 8/10. NAD noted,

## 2013-03-24 NOTE — ED Notes (Signed)
Pt alert & oriented x4, stable gait. Patient  given discharge instructions, paperwork & prescription(s). Patient verbalized understanding. Pt left department w/ no further questions. 

## 2013-03-24 NOTE — ED Provider Notes (Signed)
CSN: [id number]     Arrival date & time 03/24/13  1410 History   First MD Initiated Contact with Patient 03/24/13 1518     Chief Complaint  Patient presents with  . Abdominal Pain  . Back Pain    HPI Pt was seen at 1525. Per pt, c/o gradual onset and persistence of constant lower abd "pain" for the past 1 week. Has been associated with dysuria, nausea and radiation into her lower back. Pt states she was evaluated by her PMD for same 4 days ago, dx UTI, rx bactrim and pyridium. States her symptoms continue. Denies vomiting/diarrhea, no CP/SOB, no fevers, no rash, no vaginal bleeding/discharge.    Past Medical History  Diagnosis Date  . Hypertension   . Migraine   . Chronic back pain    Past Surgical History  Procedure Laterality Date  . Abdominal hysterectomy    . Cholecystectomy    . Hand surgery      History  Substance Use Topics  . Smoking status: Current Every Day Smoker    Types: Cigarettes  . Smokeless tobacco: Not on file  . Alcohol Use: No    Review of Systems ROS: Statement: All systems negative except as marked or noted in the HPI; Constitutional: Negative for fever and chills. ; ; Eyes: Negative for eye pain, redness and discharge. ; ; ENMT: Negative for ear pain, hoarseness, nasal congestion, sinus pressure and sore throat. ; ; Cardiovascular: Negative for chest pain, palpitations, diaphoresis, dyspnea and peripheral edema. ; ; Respiratory: Negative for cough, wheezing and stridor. ; ; Gastrointestinal: +nausea, abd pain. Negative for vomiting, diarrhea, blood in stool, hematemesis, jaundice and rectal bleeding. . ; ; Genitourinary: +dysuria. Negative for flank pain and hematuria. ; ; GYN:  No vaginal bleeding, no vaginal discharge, no vulvar pain.;; Musculoskeletal: +LBP. Negative for neck pain. Negative for swelling and trauma.; ; Skin: Negative for pruritus, rash, abrasions, blisters, bruising and skin lesion.; ; Neuro: Negative for headache, lightheadedness and neck  stiffness. Negative for weakness, altered level of consciousness , altered mental status, extremity weakness, paresthesias, involuntary movement, seizure and syncope.     Allergies  Levaquin; Ibuprofen; Naproxen; and Penicillins  Home Medications   Current Outpatient Rx  Name  Route  Sig  Dispense  Refill  . aspirin 325 MG tablet   Oral   Take 650 mg by mouth daily as needed for pain.         Marland Kitchen levofloxacin (LEVAQUIN) 500 MG tablet   Oral   Take 1 tablet (500 mg total) by mouth daily.   10 tablet   0    BP 103/70  Pulse 71  Temp(Src) 97.5 F (36.4 C) (Oral)  Resp 18  Ht 5\' 1"  (1.549 m)  Wt 111 lb (50.349 kg)  BMI 20.98 kg/m2  SpO2 99% Physical Exam 1530: Physical examination:  Nursing notes reviewed; Vital signs and O2 SAT reviewed;  Constitutional: Well developed, Well nourished, Well hydrated, In no acute distress; Head:  Normocephalic, atraumatic; Eyes: EOMI, PERRL, No scleral icterus; ENMT: Mouth and pharynx normal, Mucous membranes moist; Neck: Supple, Full range of motion, No lymphadenopathy; Cardiovascular: Regular rate and rhythm, No gallop; Respiratory: Breath sounds clear & equal bilaterally, No wheezes.  Speaking full sentences with ease, Normal respiratory effort/excursion; Chest: Nontender, Movement normal; Abdomen: Soft, +diffuse tenderness to palp. No rebound or guarding. Nondistended, Normal bowel sounds; Genitourinary: No CVA tenderness. Pelvic exam performed with permission of pt and female ED RN assist during exam.  External  genitalia w/o lesions, edema, erythema. Vaginal vault with thick white discharge.  Cervix surgically absent. GC/chlam and wet prep obtained and sent to lab.  Bimanual exam w/o bilateral adnexal tenderness.; Spine:  No midline CS, TS, LS tenderness. +TTP bilat lumbar paraspinal muscles;; Extremities: Pulses normal, No tenderness, No edema, No calf edema or asymmetry.; Neuro: AA&Ox3, Major CN grossly intact.  Speech clear. Climbs on and off  stretcher easily by herself. Gait steady. No gross focal motor or sensory deficits in extremities.; Skin: Color normal, Warm, Dry.   ED Course  Procedures    EKG Interpretation   None       MDM  MDM Reviewed: previous chart, nursing note and vitals Reviewed previous: labs Interpretation: labs and CT scan     Results for orders placed during the hospital encounter of 03/24/13  WET PREP, GENITAL      Result Value Range   Yeast Wet Prep HPF POC NONE SEEN  NONE SEEN   Trich, Wet Prep NONE SEEN  NONE SEEN   Clue Cells Wet Prep HPF POC NONE SEEN  NONE SEEN   WBC, Wet Prep HPF POC FEW (*) NONE SEEN  URINALYSIS, ROUTINE W REFLEX MICROSCOPIC      Result Value Range   Color, Urine YELLOW  YELLOW   APPearance CLEAR  CLEAR   Specific Gravity, Urine 1.025  1.005 - 1.030   pH 6.0  5.0 - 8.0   Glucose, UA NEGATIVE  NEGATIVE mg/dL   Hgb urine dipstick NEGATIVE  NEGATIVE   Bilirubin Urine NEGATIVE  NEGATIVE   Ketones, ur NEGATIVE  NEGATIVE mg/dL   Protein, ur NEGATIVE  NEGATIVE mg/dL   Urobilinogen, UA 0.2  0.0 - 1.0 mg/dL   Nitrite NEGATIVE  NEGATIVE   Leukocytes, UA NEGATIVE  NEGATIVE  CBC WITH DIFFERENTIAL      Result Value Range   WBC 7.1  4.0 - 10.5 K/uL   RBC 4.08  3.87 - 5.11 MIL/uL   Hemoglobin 13.4  12.0 - 15.0 g/dL   HCT 16.1  09.6 - 04.5 %   MCV 97.3  78.0 - 100.0 fL   MCH 32.8  26.0 - 34.0 pg   MCHC 33.8  30.0 - 36.0 g/dL   RDW 40.9  81.1 - 91.4 %   Platelets 307  150 - 400 K/uL   Neutrophils Relative % 43  43 - 77 %   Neutro Abs 3.1  1.7 - 7.7 K/uL   Lymphocytes Relative 43  12 - 46 %   Lymphs Abs 3.0  0.7 - 4.0 K/uL   Monocytes Relative 10  3 - 12 %   Monocytes Absolute 0.7  0.1 - 1.0 K/uL   Eosinophils Relative 4  0 - 5 %   Eosinophils Absolute 0.3  0.0 - 0.7 K/uL   Basophils Relative 0  0 - 1 %   Basophils Absolute 0.0  0.0 - 0.1 K/uL  COMPREHENSIVE METABOLIC PANEL      Result Value Range   Sodium 138  135 - 145 mEq/L   Potassium 4.3  3.5 - 5.1 mEq/L    Chloride 105  96 - 112 mEq/L   CO2 24  19 - 32 mEq/L   Glucose, Bld 91  70 - 99 mg/dL   BUN 9  6 - 23 mg/dL   Creatinine, Ser 7.82  0.50 - 1.10 mg/dL   Calcium 9.6  8.4 - 95.6 mg/dL   Total Protein 7.4  6.0 - 8.3 g/dL  Albumin 3.6  3.5 - 5.2 g/dL   AST 17  0 - 37 U/L   ALT 8  0 - 35 U/L   Alkaline Phosphatase 90  39 - 117 U/L   Total Bilirubin <0.1 (*) 0.3 - 1.2 mg/dL   GFR calc non Af Amer 71 (*) >90 mL/min   GFR calc Af Amer 82 (*) >90 mL/min  LIPASE, BLOOD      Result Value Range   Lipase 25  11 - 59 U/L   Ct Abdomen Pelvis W Contrast 03/24/2013   CLINICAL DATA:  Low abdominal pain and low back pain for the past 10 days.  EXAM: CT ABDOMEN AND PELVIS WITH CONTRAST  TECHNIQUE: Multidetector CT imaging of the abdomen and pelvis was performed using the standard protocol following bolus administration of intravenous contrast.  CONTRAST:  50mL OMNIPAQUE IOHEXOL 300 MG/ML SOLN, OMNIPAQUE IOHEXOL 300 MG/ML SOLN  COMPARISON:  Multiple exams, including 07/25/2004  FINDINGS: The liver, spleen, pancreas, and adrenal glands appear unremarkable. Common bile duct measures to 11 mm in diameter, possibly physiologic response to cholecystectomy.  New 2.0 x 2.3 cm exophytic cyst from the left kidney upper pole appears to have a single internal septation, and is accordingly a Bosniak category 2.  Mild caliectasis on the right with normal caliber ureter, query low grade partial UPJ narrowing.  Appendix normal. Ovarian contours normal. Uterus absent. Urinary bladder normal.  No overt small bowel dilatation. Prominent stool throughout the colon favors constipation.  No pathologic upper abdominal adenopathy is observed. No pathologic pelvic adenopathy is observed. Central disc protrusion at L4-5.  IMPRESSION: 1. New Bosniak category 2 cyst (complex, but benign) exophytic from the left kidney upper pole. 2. 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 No stone  observed. 3.  Prominent stool throughout the colon favors constipation. 4. Central disc protrusion at L4-5.   Electronically Signed   By: Herbie Baltimore M.D.   On: 03/24/2013 18:47    1900:  CT scan with constipation. No UTI. Will have pt continue her abx and tx constipation at home. Pt wants to leave now. Dx and testing d/w pt.  Questions answered.  Verb understanding, agreeable to d/c home with outpt f/u.    Laray Anger, DO 03/27/13 1029

## 2013-03-25 LAB — GC/CHLAMYDIA PROBE AMP
CT Probe RNA: NEGATIVE
GC Probe RNA: NEGATIVE

## 2013-06-22 ENCOUNTER — Emergency Department (HOSPITAL_COMMUNITY)
Admission: EM | Admit: 2013-06-22 | Discharge: 2013-06-22 | Disposition: A | Discharge status: Home / Self Care | Payer: Self-pay | Attending: Emergency Medicine | Admitting: Emergency Medicine

## 2013-06-22 ENCOUNTER — Encounter (HOSPITAL_COMMUNITY): Payer: Self-pay | Admitting: Emergency Medicine

## 2013-06-22 ENCOUNTER — Emergency Department (HOSPITAL_COMMUNITY): Payer: Self-pay

## 2013-06-22 DIAGNOSIS — Y990 Civilian activity done for income or pay: Secondary | ICD-10-CM | POA: Insufficient documentation

## 2013-06-22 DIAGNOSIS — G43909 Migraine, unspecified, not intractable, without status migrainosus: Secondary | ICD-10-CM | POA: Insufficient documentation

## 2013-06-22 DIAGNOSIS — I10 Essential (primary) hypertension: Secondary | ICD-10-CM | POA: Insufficient documentation

## 2013-06-22 DIAGNOSIS — Z880 Allergy status to penicillin: Secondary | ICD-10-CM | POA: Insufficient documentation

## 2013-06-22 DIAGNOSIS — X500XXA Overexertion from strenuous movement or load, initial encounter: Secondary | ICD-10-CM | POA: Insufficient documentation

## 2013-06-22 DIAGNOSIS — Z792 Long term (current) use of antibiotics: Secondary | ICD-10-CM | POA: Insufficient documentation

## 2013-06-22 DIAGNOSIS — S29019A Strain of muscle and tendon of unspecified wall of thorax, initial encounter: Secondary | ICD-10-CM

## 2013-06-22 DIAGNOSIS — G8929 Other chronic pain: Secondary | ICD-10-CM | POA: Insufficient documentation

## 2013-06-22 DIAGNOSIS — F17200 Nicotine dependence, unspecified, uncomplicated: Secondary | ICD-10-CM | POA: Insufficient documentation

## 2013-06-22 DIAGNOSIS — Y9389 Activity, other specified: Secondary | ICD-10-CM | POA: Insufficient documentation

## 2013-06-22 DIAGNOSIS — Z79899 Other long term (current) drug therapy: Secondary | ICD-10-CM | POA: Insufficient documentation

## 2013-06-22 DIAGNOSIS — Y9210 Unspecified residential institution as the place of occurrence of the external cause: Secondary | ICD-10-CM | POA: Insufficient documentation

## 2013-06-22 DIAGNOSIS — S239XXA Sprain of unspecified parts of thorax, initial encounter: Secondary | ICD-10-CM | POA: Insufficient documentation

## 2013-06-22 IMAGING — CR DG THORACIC SPINE 3V
3 series · 3 of 3 positions shown · non-contrast
Comparison: None.

CLINICAL DATA: Back pain

EXAM:
THORACIC SPINE - 2 VIEW + SWIMMERS

[view not recorded (1 of 3)]
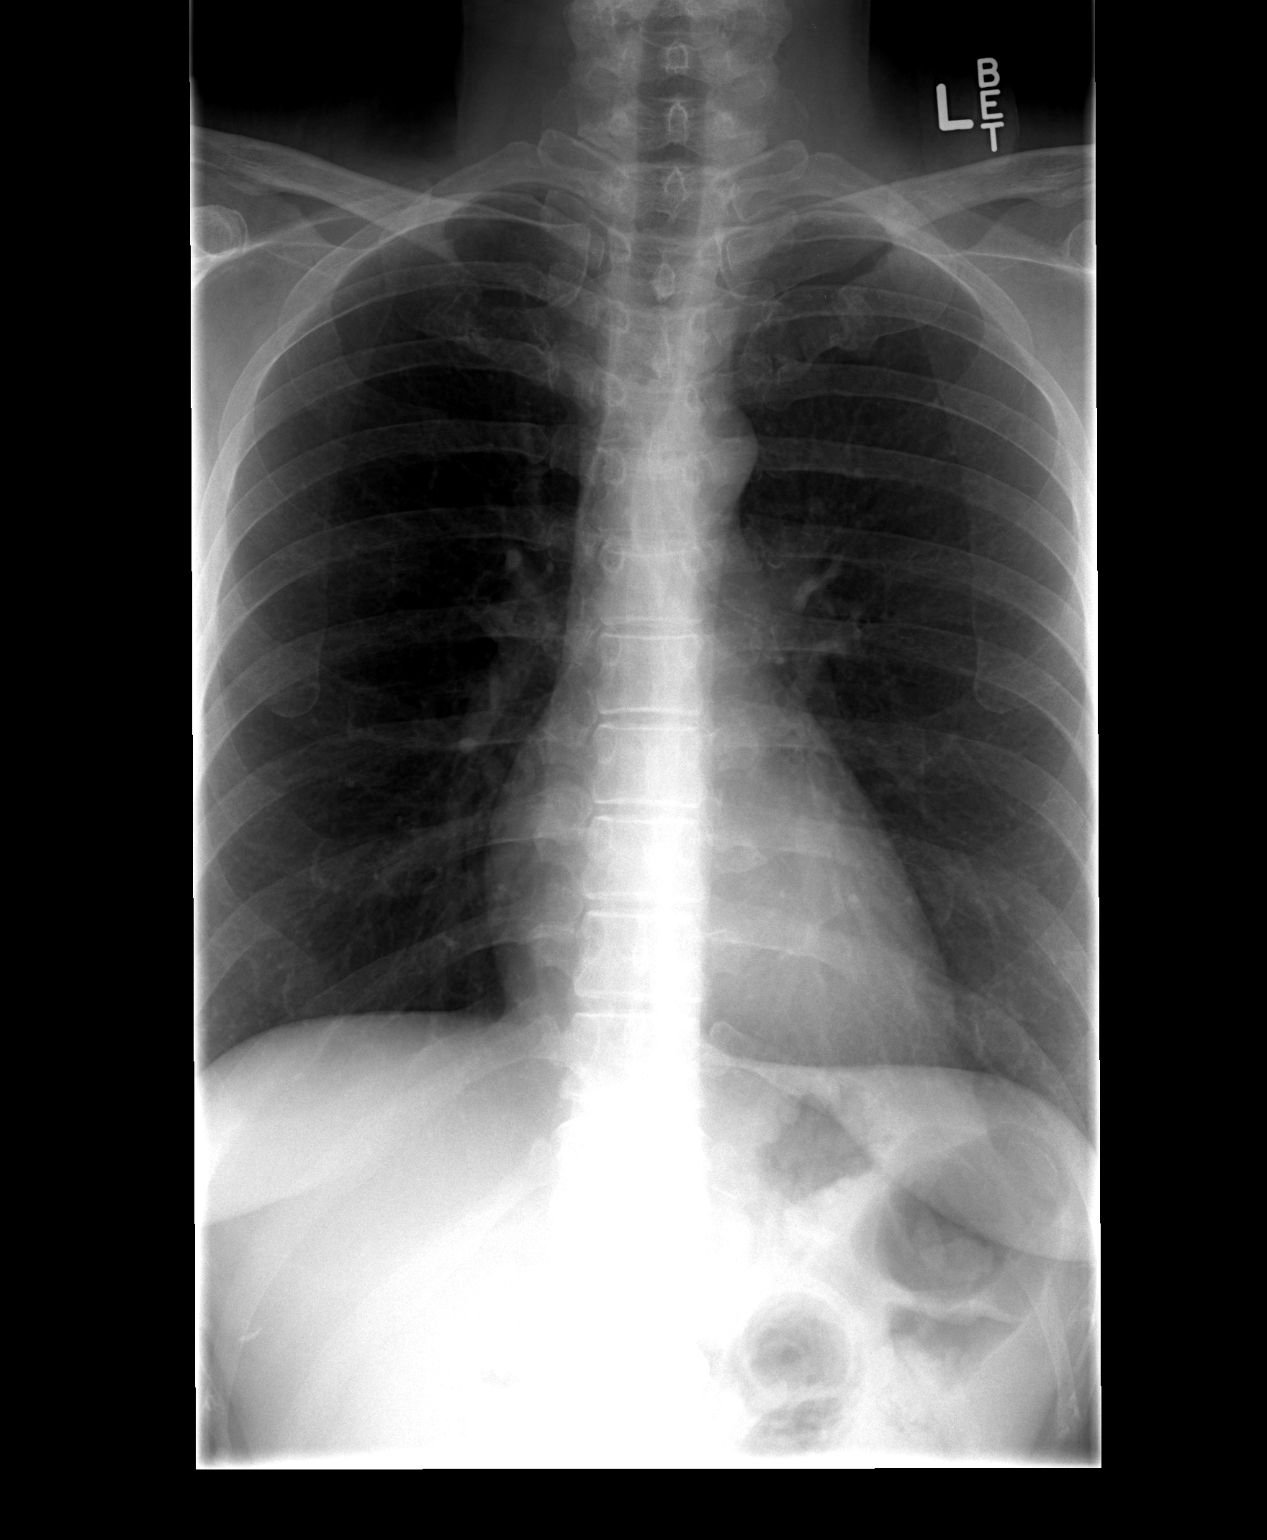

[view not recorded (2 of 3)]
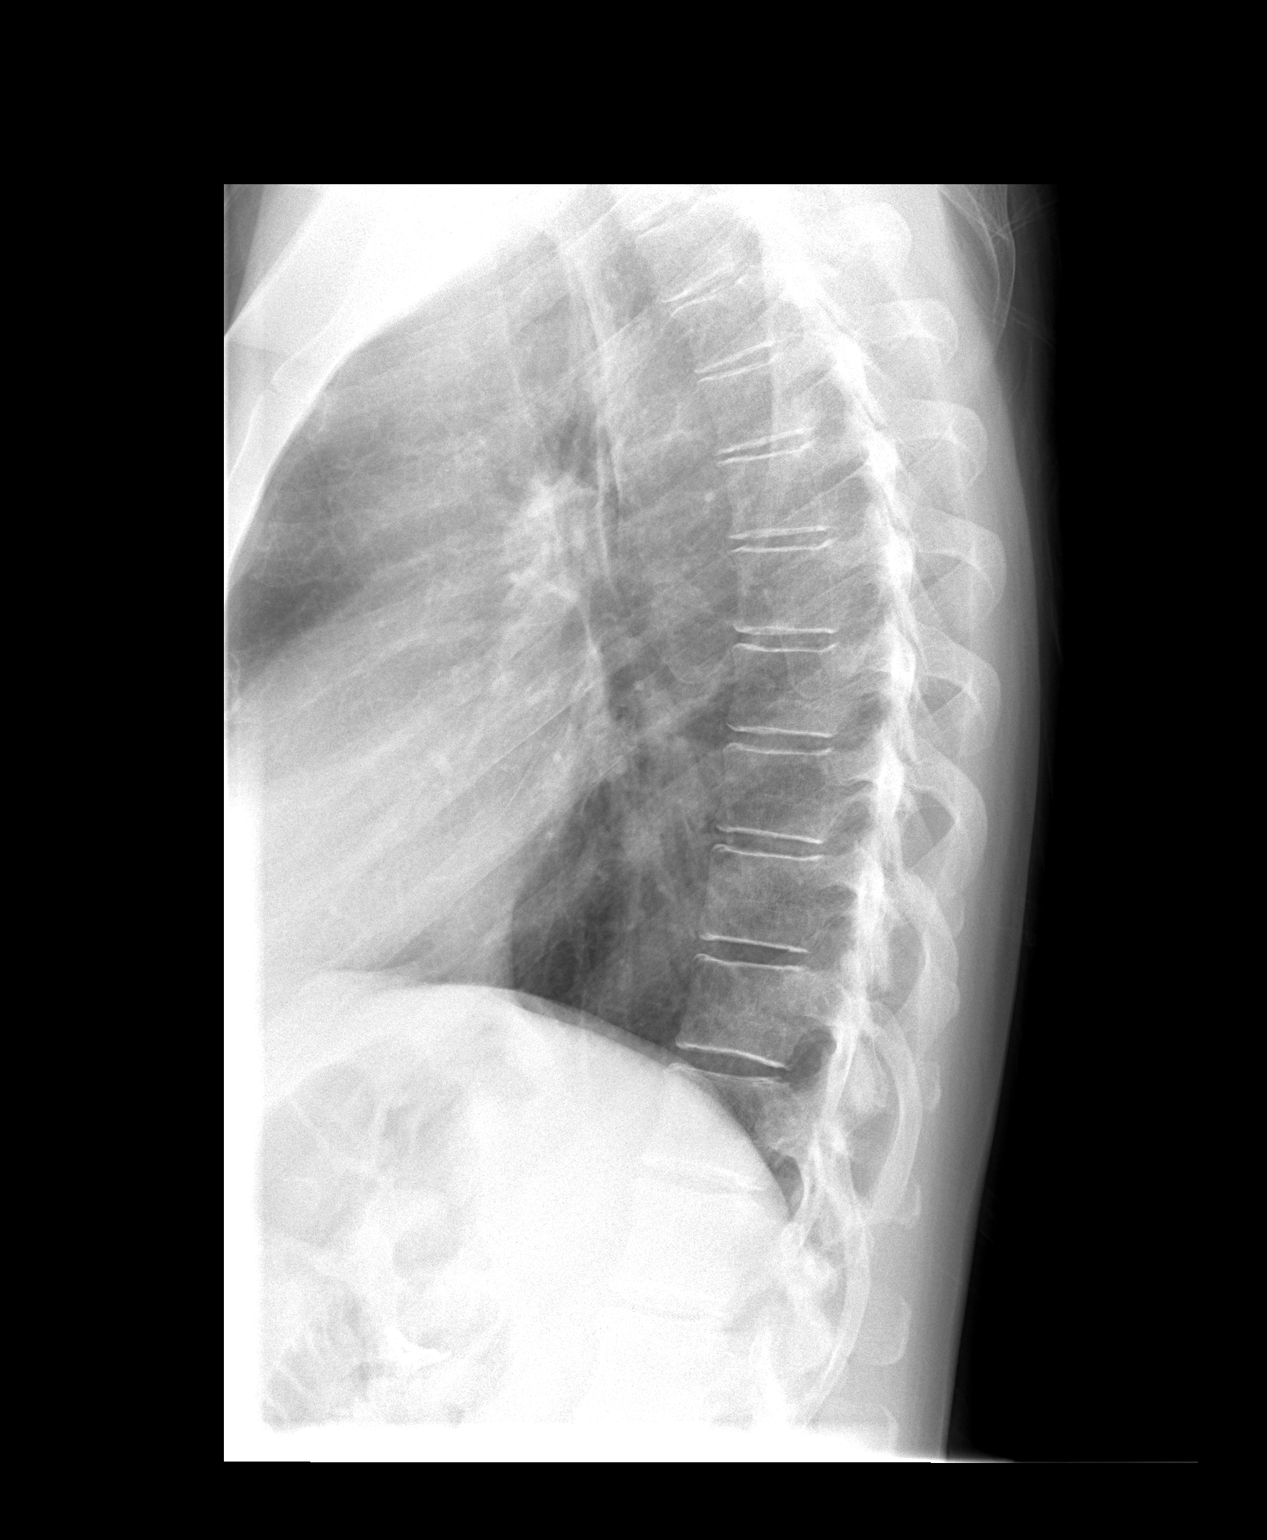

[view not recorded (3 of 3)]
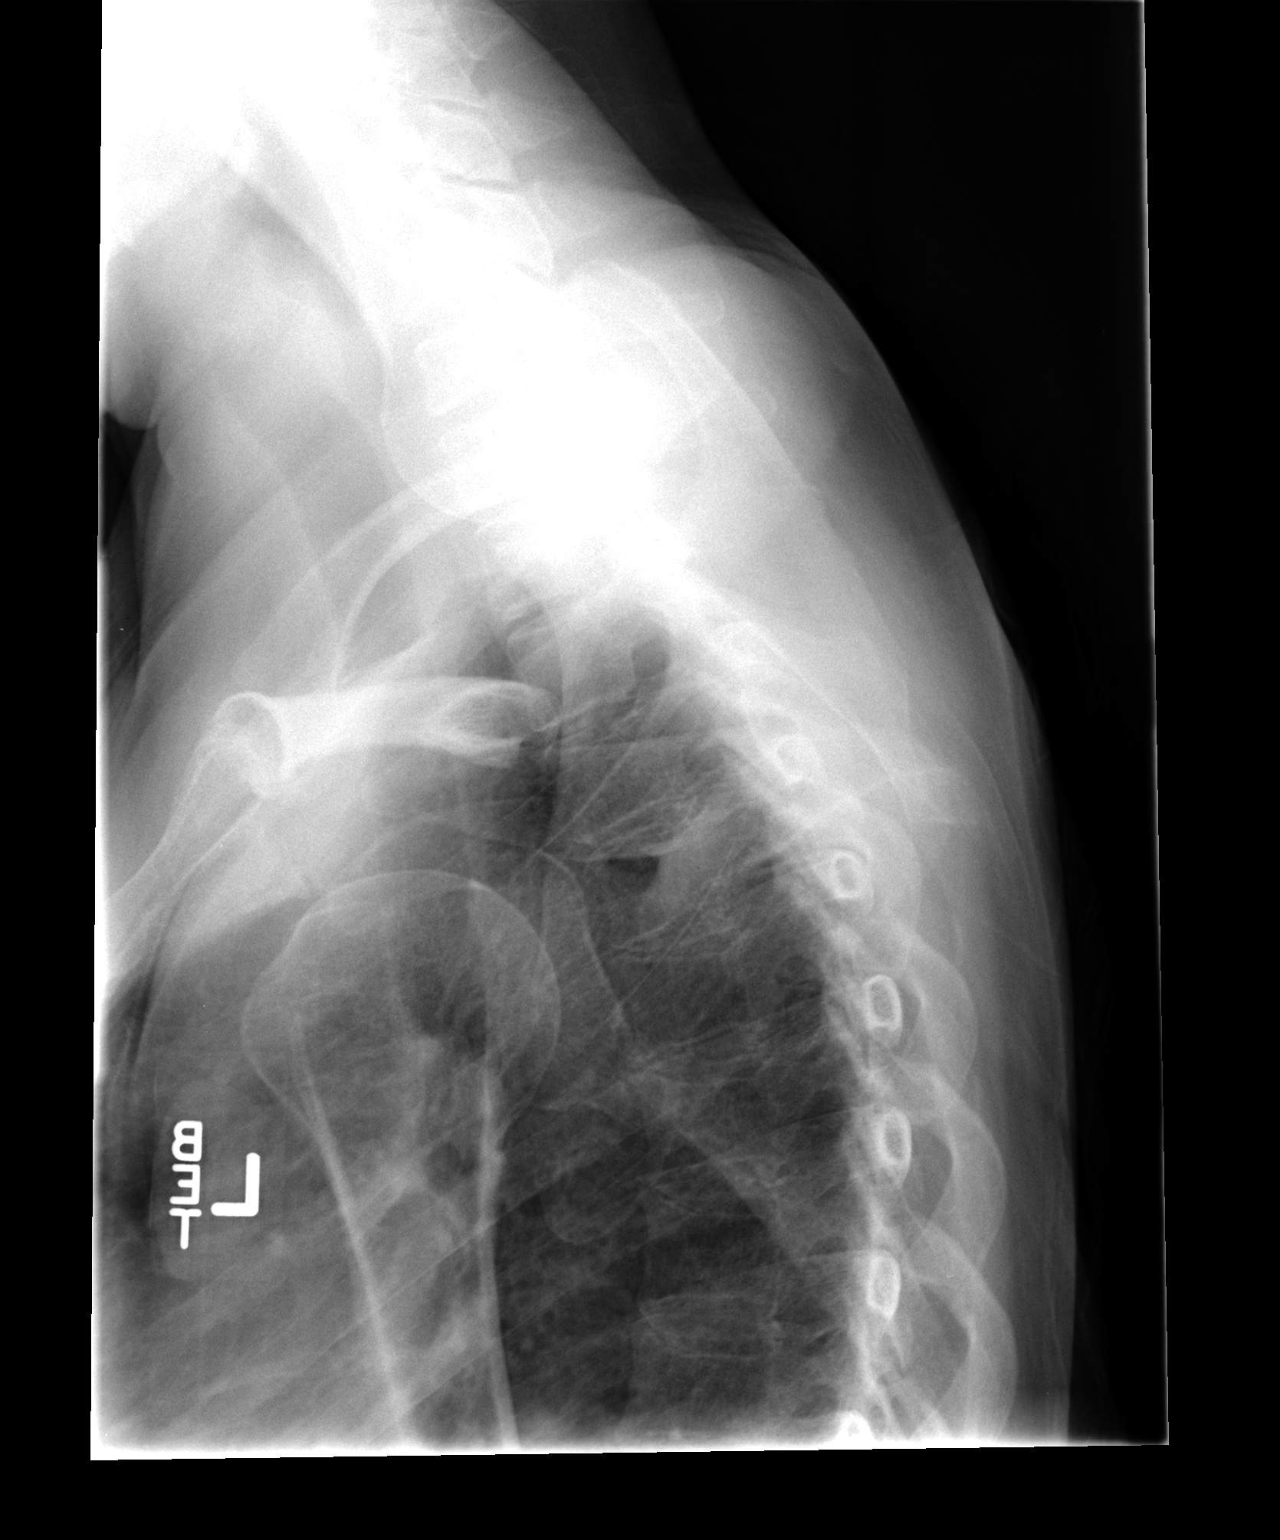

[3 of 3 positions shown; findings below may reference images not displayed]

FINDINGS: There is no evidence of thoracic spine fracture. Alignment is
normal. No other significant bone abnormalities are identified.
Prior cholecystectomy clips are identified in the right abdomen.
IMPRESSION: No acute fracture or dislocation.

## 2013-06-22 MED ORDER — HYDROCODONE-ACETAMINOPHEN 5-325 MG PO TABS
1.0000 | ORAL_TABLET | Freq: Once | ORAL | Status: AC
  Administered 2013-06-22: 1 via ORAL
  Filled 2013-06-22: qty 1

## 2013-06-22 MED ORDER — CYCLOBENZAPRINE HCL 5 MG PO TABS: 5.0000 mg | ORAL_TABLET | Freq: Three times a day (TID) | ORAL | Status: DC | PRN

## 2013-06-22 MED ORDER — HYDROCODONE-ACETAMINOPHEN 5-325 MG PO TABS: 1.0000 | ORAL_TABLET | ORAL | Status: DC | PRN

## 2013-06-22 MED ORDER — CYCLOBENZAPRINE HCL 10 MG PO TABS
5.0000 mg | ORAL_TABLET | Freq: Once | ORAL | Status: AC
  Administered 2013-06-22: 5 mg via ORAL
  Filled 2013-06-22: qty 1

## 2013-06-22 NOTE — ED Notes (Signed)
Patient c/o mid back pain. 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 Patient states she had to leave work early due to the pain. Patient states "I went home and laid down but the pain is not going away."

## 2013-06-22 NOTE — Discharge Instructions (Signed)
Thoracic Strain You have injured the muscles or tendons that attach to the upper part of your back behind your chest. This injury is called a thoracic strain, thoracic sprain, or mid-back strain.  CAUSES  The cause of thoracic strain varies. A less severe injury involves pulling a muscle or tendon without tearing it. A more severe injury involves tearing (rupturing) a muscle or tendon. With less severe injuries, there may be little loss of strength. Sometimes, there are breaks (fractures) in the bones to which the muscles are attached. These fractures are rare, unless there was a direct hit (trauma) or you have weak bones due to osteoporosis or age. Longstanding strains may be caused by overuse or improper form during certain movements. Obesity can also increase your risk for back injuries. Sudden strains may occur due to injury or not warming up properly before exercise. Often, there is no obvious cause for a thoracic strain. SYMPTOMS  The main symptom is pain, especially with movement, such as during exercise. DIAGNOSIS  Your caregiver can usually tell what is wrong by taking an X-ray and doing a physical exam. TREATMENT   Physical therapy may be helpful for recovery. Your caregiver can give you exercises to do or refer you to a physical therapist after your pain improves.  After your pain improves, strengthening and conditioning programs appropriate for your sport or occupation may be helpful.  Always warm up before physical activities or athletics. Stretching after physical activity may also help.  Certain over-the-counter medicines may also help. Ask your caregiver if there are medicines that would help you. If this is your first thoracic strain injury, proper care and proper healing time before starting activities should prevent long-term problems. Torn ligaments and tendons require as long to heal as broken bones. Average healing times may be only 1 week for a mild strain. For torn muscles  and tendons, healing time may be up to 6 weeks to 2 months. HOME CARE INSTRUCTIONS   Apply ice to the injured area. Ice massages may also be used as directed.  Put ice in a plastic bag.  Place a towel between your skin and the bag.  Leave the ice on for 15-20 minutes, 03-04 times a day, for the first 2 days.  Only take over-the-counter or prescription medicines for pain, discomfort, or fever as directed by your caregiver.  Keep your appointments for physical therapy if this was prescribed.  Use wraps and back braces as instructed. SEEK IMMEDIATE MEDICAL CARE IF:   You have an increase in bruising, swelling, or pain.  Your pain has not improved with medicines.  You develop new shortness of breath, chest pain, or fever.  Problems seem to be getting worse rather than better. MAKE SURE YOU:   Understand these instructions.  Will watch your condition.  Will get help right away if you are not doing well or get worse. Document Released: 07/21/2003 Document Revised: 07/23/2011 Document Reviewed: 06/16/2010 [facility] Patient Information 2014 [place].    Use the medicines as directed.  Do not drive within 4 hours of taking hydrocodone as this will make you drowsy.  Avoid lifting,  Bending,  Twisting or any other activity that worsens your pain over the next week.  Apply an  icepack  to yourback for 10-15 minutes every 2 hours for the next 2 days. Add heat as discussed on Wednesday.    You should get rechecked if your symptoms are not better over the next 5 days,  Or you  develop increased pain,  Weakness in your leg(s) or loss of bladder or bowel function - these are symptoms of a worse injury.

## 2013-06-23 NOTE — ED Provider Notes (Signed)
CSN: [id number]     Arrival date & time 06/22/13  1801 History   First MD Initiated Contact with Patient 06/22/13 2134     Chief Complaint  Patient presents with  . Back Pain     (Consider location/radiation/quality/duration/timing/severity/associated sxs/prior Treatment) HPI Comments: [name] is a 43 y.o. 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 Since she has not been able to get comfortable,  Describing constant pain whether she is sitting, walking, lying, although has worsened pain which radiates laterally to her sides with movment.  She denies prior pain or injury in her mid back but does have a history of lumbar back pain.  She has taken no medicines prior to arrival.     The history is provided by the patient.    Past Medical History  Diagnosis Date  . Hypertension   . Migraine   . Chronic back pain    Past Surgical History  Procedure Laterality Date  . Abdominal hysterectomy    . Cholecystectomy    . Hand surgery     Family History  Problem Relation Age of Onset  . Cancer Other   . Seizures Other   . Stroke Other   . Diabetes Other    History  Substance Use Topics  . Smoking status: Current Every Day Smoker -- 1.00 packs/day for 20 years    Types: Cigarettes  . Smokeless tobacco: Never Used  . Alcohol Use: No   OB History   Grav Para Term Preterm Abortions TAB SAB Ect Mult Living   2 2 2       2      Review of Systems  Constitutional: Negative for fever.  Respiratory: Negative for shortness of breath.   Cardiovascular: Negative for chest pain and leg swelling.  Gastrointestinal: Negative for abdominal pain, constipation and abdominal distention.  Genitourinary: Negative for dysuria, urgency, frequency, flank pain and difficulty urinating.  Musculoskeletal: Positive for back pain and myalgias.  Negative for gait problem, joint swelling and neck stiffness.  Skin: Negative for rash.  Neurological: Negative for weakness and numbness.      Allergies  Levaquin; Ibuprofen; Naproxen; and Penicillins  Home Medications   Current Outpatient Rx  Name  Route  Sig  Dispense  Refill  . Aspirin-Salicylamide-Caffeine (BC HEADACHE) 325-95-16 MG TABS   Oral   Take 1 packet by mouth 3 (three) times daily as needed (for pain).         Marland Kitchen atenolol (TENORMIN) 50 MG tablet   Oral   Take 50 mg by mouth daily.         . cyclobenzaprine (FLEXERIL) 5 MG tablet   Oral   Take 1 tablet (5 mg total) by mouth 3 (three) times daily as needed for muscle spasms.   15 tablet   0   . HYDROcodone-acetaminophen (NORCO/VICODIN) 5-325 MG per tablet   Oral   Take 1 tablet by mouth every 4 (four) hours as needed for moderate pain.   15 tablet   0   . sulfamethoxazole-trimethoprim (BACTRIM DS,SEPTRA DS) 800-160 MG per tablet   Oral   Take 1 tablet by mouth 2 (two) times daily. 10 day course starting on 03/20/2013          BP 143/78  Pulse 88  Temp(Src) 98.4 F (36.9 C) (Oral)  Resp  20  Ht 5\' 1"  (1.549 m)  Wt 110 lb (49.896 kg)  BMI 20.80 kg/m2  SpO2 100% Physical Exam  Nursing note and vitals reviewed. Constitutional: She appears well-developed and well-nourished.  HENT:  Head: Normocephalic.  Eyes: Conjunctivae are normal.  Neck: Normal range of motion. Neck supple.  Cardiovascular: Normal rate and intact distal pulses.   Pedal pulses normal.  Pulmonary/Chest: Effort normal.  Abdominal: Soft. Bowel sounds are normal. She exhibits no distension and no mass.  Musculoskeletal: Normal range of motion. She exhibits no edema.       Thoracic back: She exhibits bony tenderness and spasm. She exhibits no swelling, no edema and no deformity.       Lumbar back: She exhibits no tenderness, no swelling, no edema and no spasm.       Back:  Neurological: She is alert. She has normal strength. She  displays no atrophy and no tremor. No sensory deficit. Gait normal.  Reflex Scores:      Patellar reflexes are 2+ on the right side and 2+ on the left side.      Achilles reflexes are 2+ on the right side and 2+ on the left side. No strength deficit noted in hip and knee flexor and extensor muscle groups.  Ankle flexion and extension intact.  Skin: Skin is warm and dry.  Psychiatric: She has a normal mood and affect.    ED Course  Procedures (including critical care time) Labs Review Labs Reviewed - No data to display Imaging Review Dg Thoracic Spine W/swimmers  06/22/2013   CLINICAL DATA:  Back pain  EXAM: THORACIC SPINE - 2 VIEW + SWIMMERS  COMPARISON:  None.  FINDINGS: There is no evidence of thoracic spine fracture. Alignment is normal. No other significant bone abnormalities are identified. Prior cholecystectomy clips are identified in the right abdomen.  IMPRESSION: No acute fracture or dislocation.   Electronically Signed   By: Abelardo Diesel M.D.   On: 06/22/2013 21:49    EKG Interpretation   None       MDM   Final diagnoses:  Thoracic myofascial strain    Patients labs and/or radiological studies were viewed and considered during the medical decision making and disposition process. Pt prescribed hydrocodone, flexeril,  Encouraged ice tx x 2 days, adding heat on day 3. Planned f/u with her pcp at [place] in 1 week if sx persist.  The patient appears reasonably screened and/or stabilized for discharge and I doubt any other medical condition or other [facility] requiring further screening, evaluation, or treatment in the ED at this time prior to discharge. No neuro deficit on exam or by history to suggest emergent or surgical presentation.  Also discussed worsened sx that should prompt immediate re-evaluation.      [name], PA-C 06/23/13 [phone]

## 2013-06-25 NOTE — ED Provider Notes (Signed)
Medical screening examination/treatment/procedure(s) were performed by non-physician practitioner and as supervising physician I was immediately available for consultation/collaboration.  EKG Interpretation   None         Maudry Diego, MD 06/25/13 1435

## 2014-03-15 ENCOUNTER — Encounter (HOSPITAL_COMMUNITY): Payer: Self-pay | Admitting: Emergency Medicine

## 2014-04-06 ENCOUNTER — Emergency Department (HOSPITAL_COMMUNITY)
Admission: EM | Admit: 2014-04-06 | Discharge: 2014-04-06 | Disposition: A | Discharge status: Home / Self Care | Payer: Self-pay | Attending: Emergency Medicine | Admitting: Emergency Medicine

## 2014-04-06 ENCOUNTER — Encounter (HOSPITAL_COMMUNITY): Payer: Self-pay | Admitting: Emergency Medicine

## 2014-04-06 ENCOUNTER — Emergency Department (HOSPITAL_COMMUNITY): Payer: Self-pay

## 2014-04-06 DIAGNOSIS — I10 Essential (primary) hypertension: Secondary | ICD-10-CM | POA: Insufficient documentation

## 2014-04-06 DIAGNOSIS — Z79899 Other long term (current) drug therapy: Secondary | ICD-10-CM | POA: Insufficient documentation

## 2014-04-06 DIAGNOSIS — G8929 Other chronic pain: Secondary | ICD-10-CM | POA: Insufficient documentation

## 2014-04-06 DIAGNOSIS — R52 Pain, unspecified: Secondary | ICD-10-CM

## 2014-04-06 DIAGNOSIS — Z880 Allergy status to penicillin: Secondary | ICD-10-CM | POA: Insufficient documentation

## 2014-04-06 DIAGNOSIS — Z720 Tobacco use: Secondary | ICD-10-CM | POA: Insufficient documentation

## 2014-04-06 DIAGNOSIS — G43909 Migraine, unspecified, not intractable, without status migrainosus: Secondary | ICD-10-CM | POA: Insufficient documentation

## 2014-04-06 DIAGNOSIS — M25562 Pain in left knee: Secondary | ICD-10-CM

## 2014-04-06 IMAGING — CR DG KNEE COMPLETE 4+V*L*
4 series · 4 of 4 positions shown · non-contrast
Comparison: Two views left knee [DATE] at [DATE] hr.

CLINICAL DATA: Left knee pain for 1 day.  No known injury.

EXAM:
LEFT KNEE - COMPLETE 4+ VIEW

[view not recorded (1 of 4)]
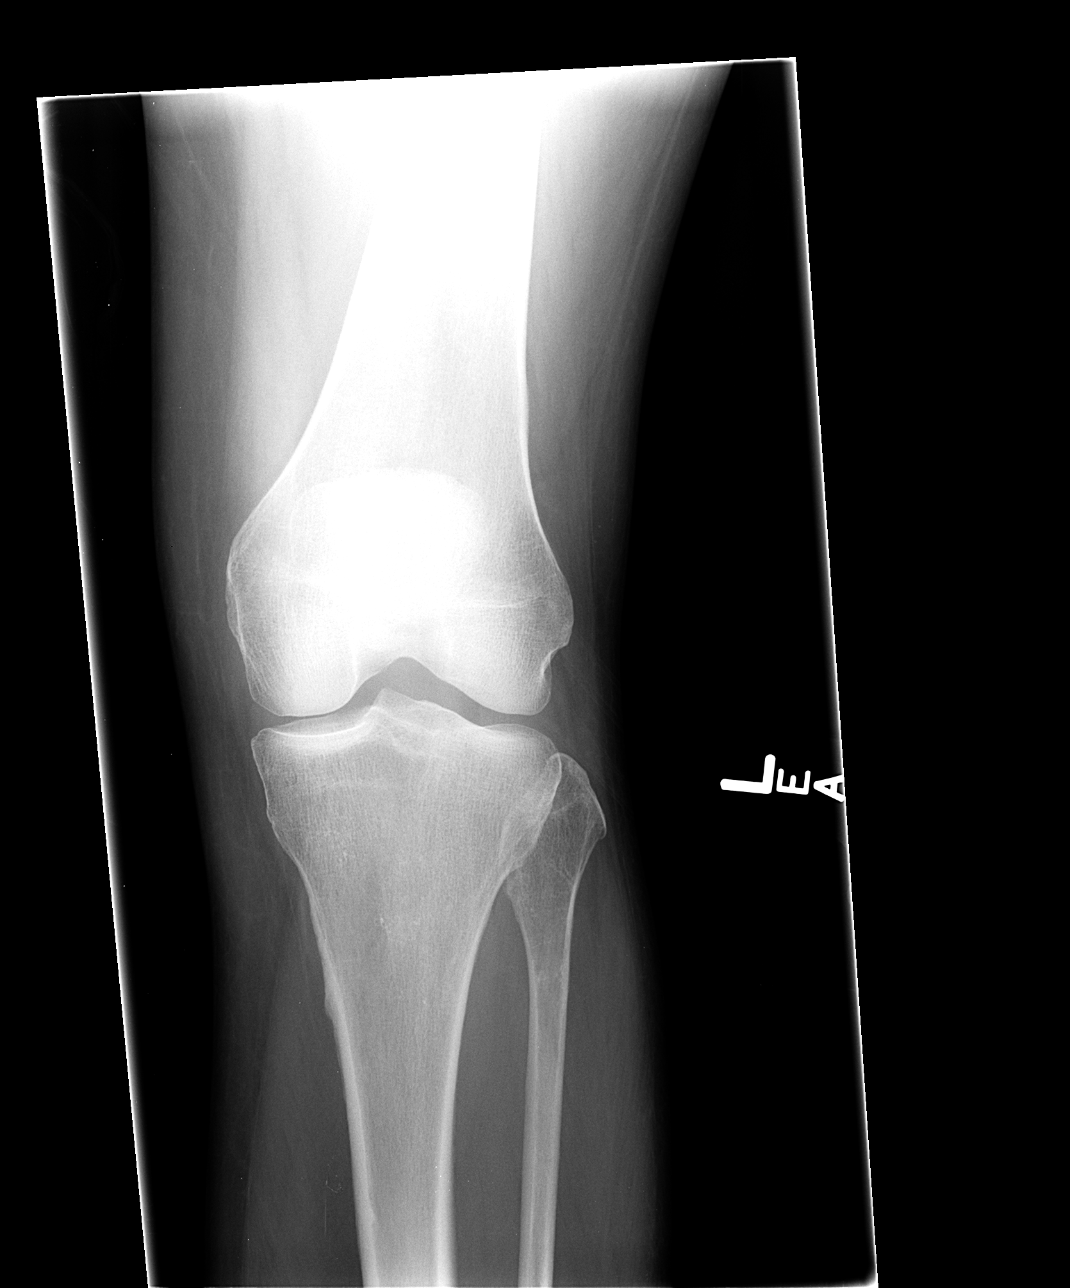

[view not recorded (2 of 4)]
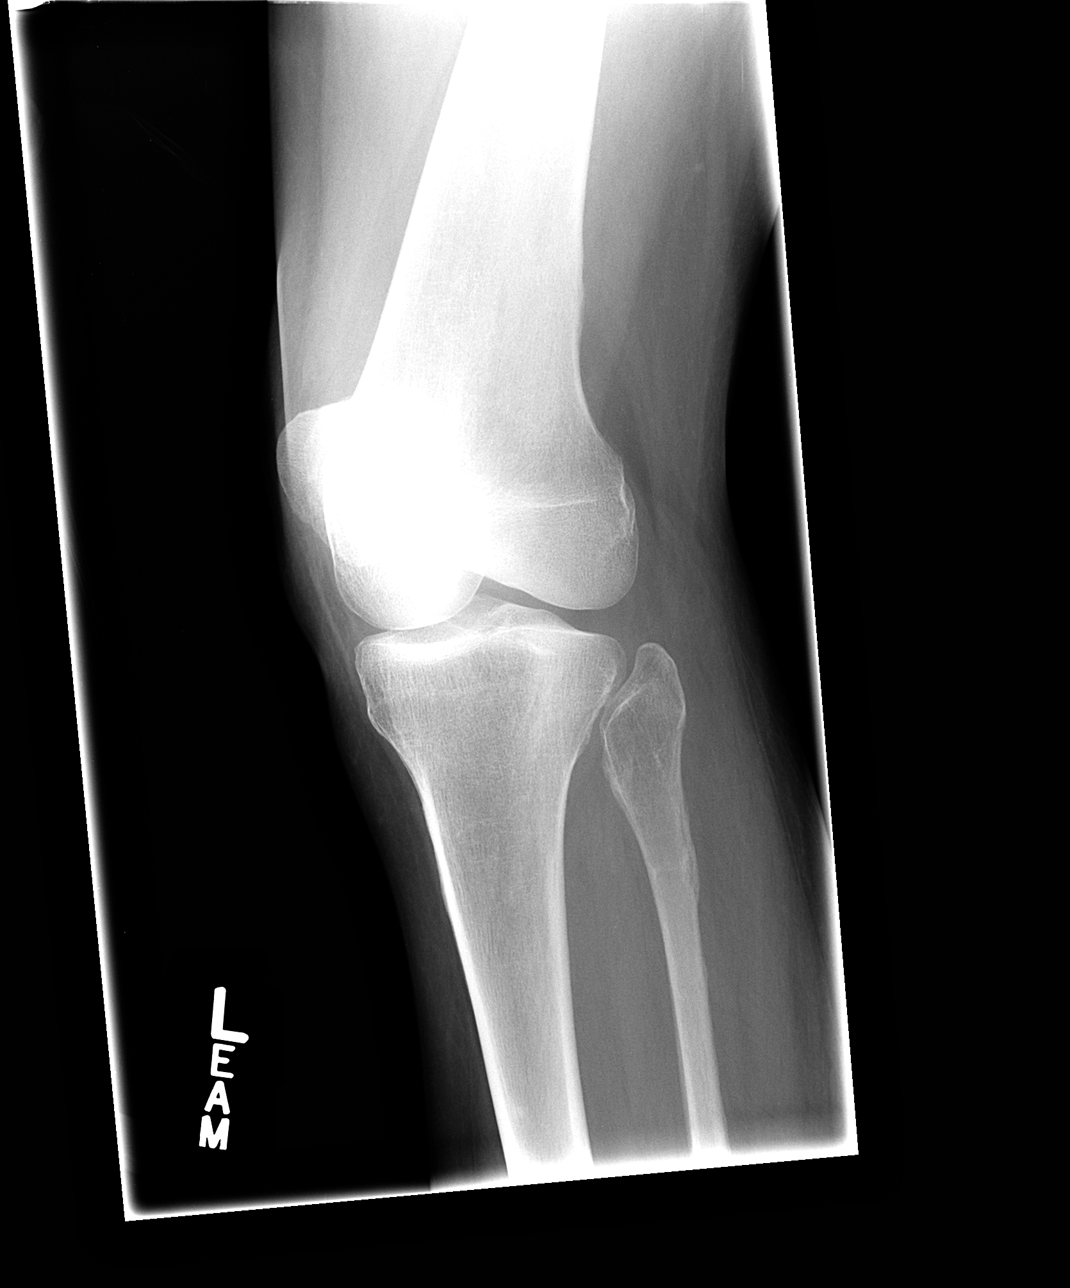

[view not recorded (3 of 4)]
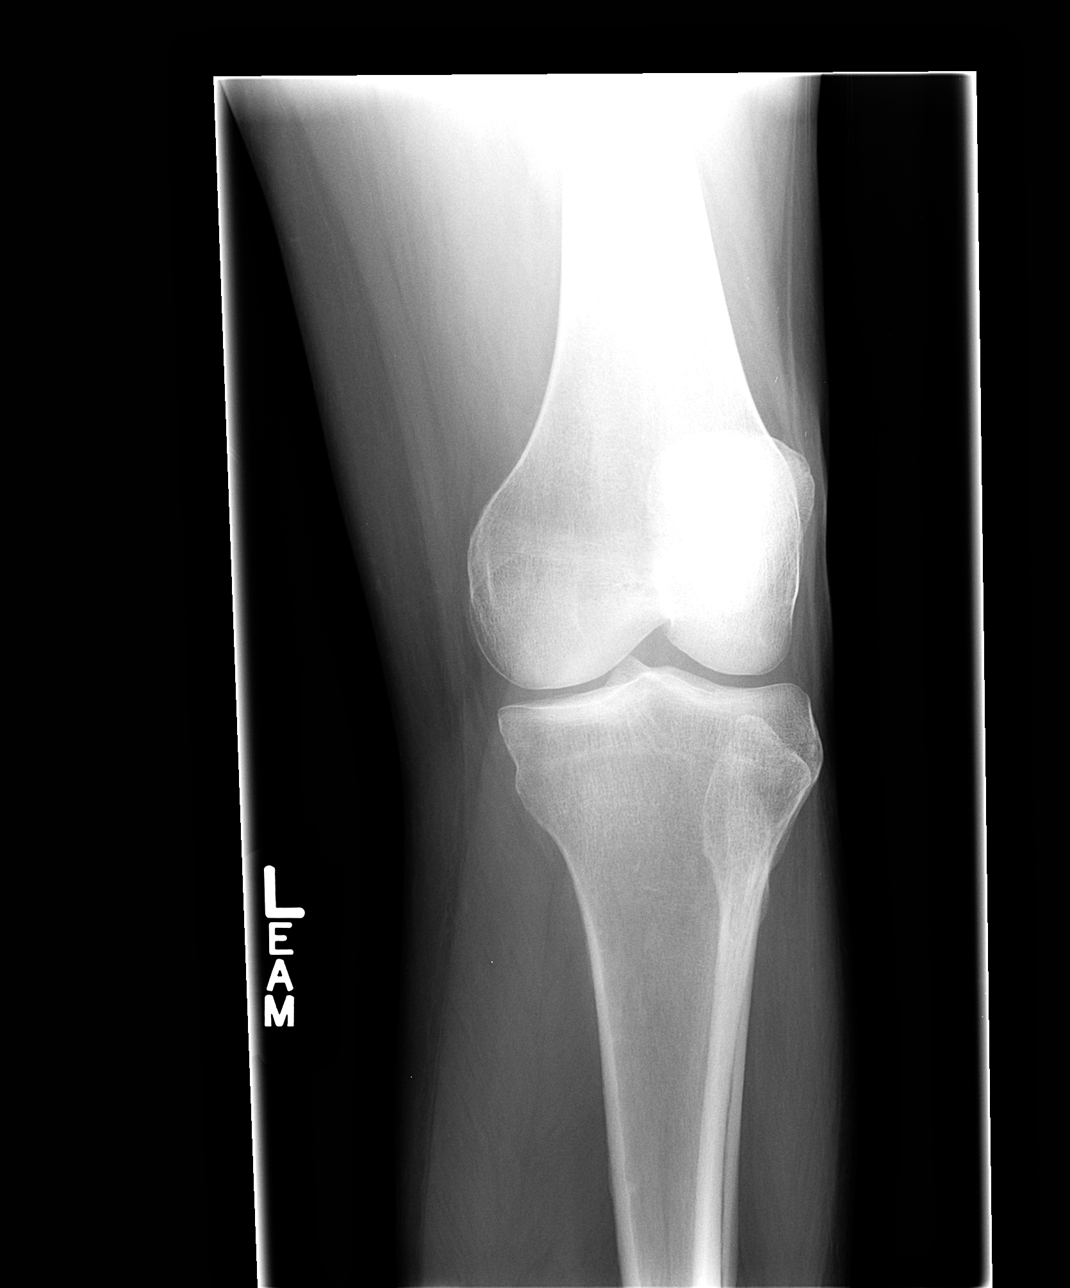

[view not recorded (4 of 4)]
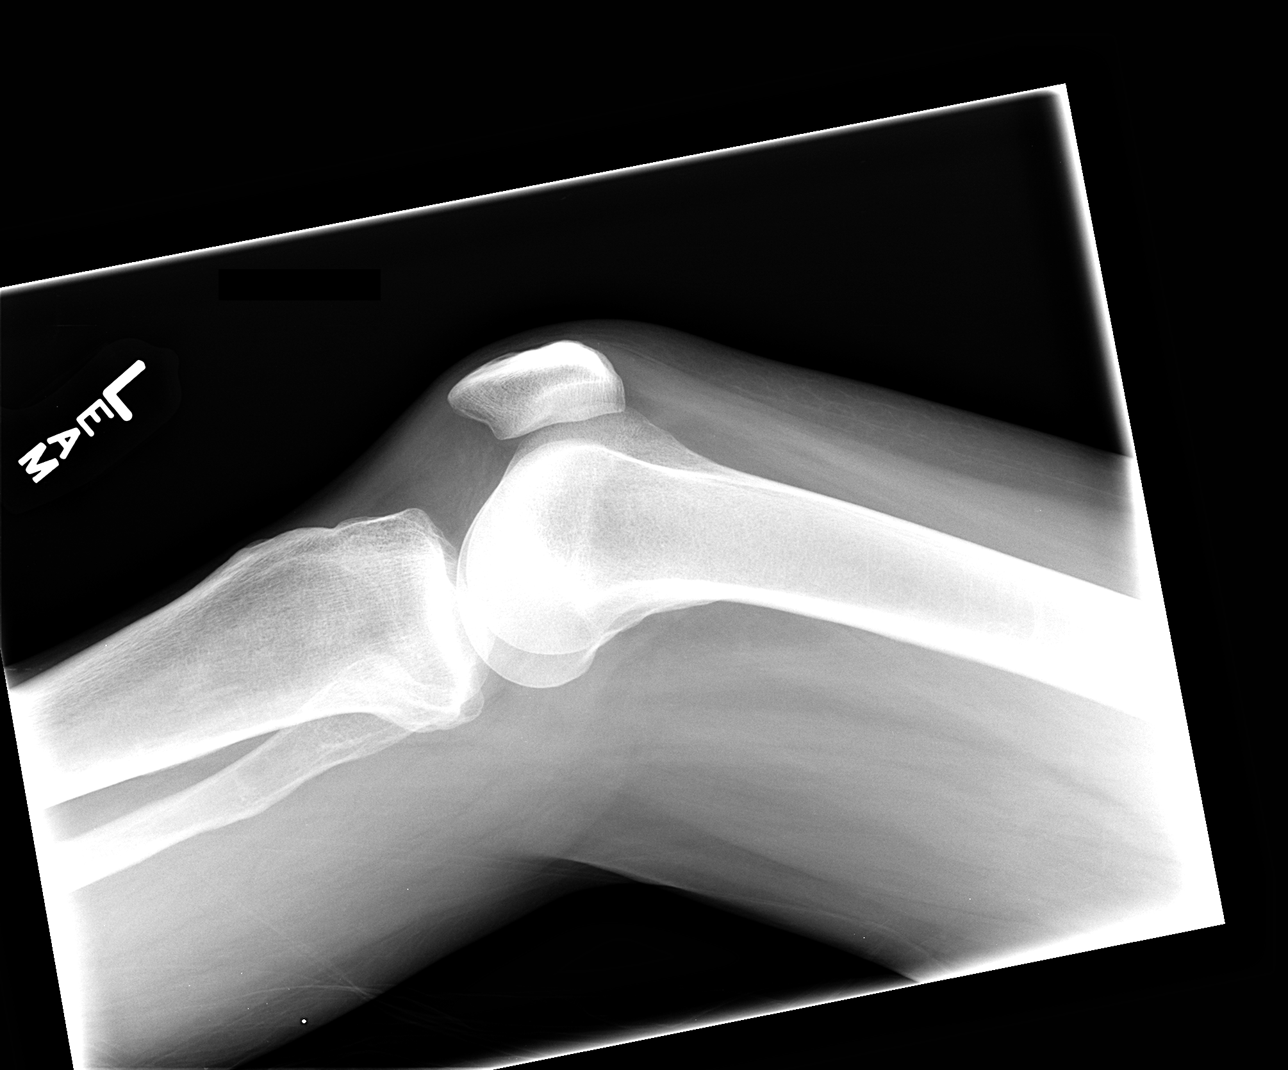

[4 of 4 positions shown; findings below may reference images not displayed]

FINDINGS: Imaged bones, joints and soft tissues appear normal.
IMPRESSION: Negative exam.

## 2014-04-06 MED ORDER — HYDROCODONE-ACETAMINOPHEN 5-325 MG PO TABS: 1.0000 | ORAL_TABLET | ORAL | Status: DC | PRN

## 2014-04-06 MED ORDER — HYDROCODONE-ACETAMINOPHEN 5-325 MG PO TABS
1.0000 | ORAL_TABLET | Freq: Once | ORAL | Status: AC
  Administered 2014-04-06: 1 via ORAL
  Filled 2014-04-06: qty 1

## 2014-04-06 NOTE — Discharge Instructions (Signed)

## 2014-04-06 NOTE — ED Notes (Signed)
Patient given discharge instruction, verbalized understand. Patient ambulatory out of the department.  

## 2014-04-06 NOTE — ED Provider Notes (Signed)
CSN: [id number]     Arrival date & time 04/06/14  1755 History   First MD Initiated Contact with Patient 04/06/14 1827     Chief Complaint  Patient presents with  . Leg Injury     (Consider location/radiation/quality/duration/timing/severity/associated sxs/prior Treatment) The history is provided by the patient.   [name] is a 43 y.o. female presenting for evaluation of a fractured fibula.  She was seen by her pcp today and xrays revealed a proximal fibular fracture.  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  She has taken no medicines for this pain, but has applied ice with temporary improvement.  She reports prior intermittent episodes of pain in this knee but never so severe as today.  Her pain radiates to her lower leg intermittently with weight bearing.     Past Medical History  Diagnosis Date  . Hypertension   . Migraine   . Chronic back pain    Past Surgical History  Procedure Laterality Date  . Abdominal hysterectomy    . Cholecystectomy    . Hand surgery     Family History  Problem Relation Age of Onset  . Cancer Other   . Seizures Other   . Stroke Other   . Diabetes Other    History  Substance Use Topics  . Smoking status: Current Every Day Smoker -- 1.00 packs/day for 20 years    Types: Cigarettes  . Smokeless tobacco: Never Used  . Alcohol Use: No   OB History    Gravida Para Term Preterm AB TAB SAB Ectopic Multiple Living   2 2 2       2      Review of Systems  Constitutional: Negative for fever.  Musculoskeletal: Positive for arthralgias. Negative for myalgias and joint swelling.  Neurological: Negative for weakness and numbness.      Allergies  Levaquin; Ibuprofen; Naproxen; and Penicillins  Home Medications   Prior to Admission medications   Medication Sig Start Date End Date Taking? Authorizing Provider   Aspirin-Salicylamide-Caffeine (BC HEADACHE) 325-95-16 MG TABS Take 1 packet by mouth 3 (three) times daily as needed (for pain).   Yes Historical Provider, MD  cyclobenzaprine (FLEXERIL) 5 MG tablet Take 1 tablet (5 mg total) by mouth 3 (three) times daily as needed for muscle spasms. Patient not taking: Reported on 04/06/2014 06/22/13   [name], PA-C  HYDROcodone-acetaminophen (NORCO/VICODIN) 5-325 MG per tablet Take 1 tablet by mouth every 4 (four) hours as needed. 04/06/14   [name], PA-C   BP 176/87 mmHg  Pulse 106  Temp(Src) 98.8 F (37.1 C) (Oral)  Resp 20  Ht 5\' 1"  (1.549 m)  Wt 110 lb (49.896 kg)  BMI 20.80 kg/m2  SpO2 100% Physical Exam  Constitutional: She appears well-developed and well-nourished.  HENT:  Head: Atraumatic.  Neck: Normal range of motion.  Cardiovascular:  Pulses equal bilaterally  Musculoskeletal: She exhibits tenderness.       Left knee: She exhibits bony tenderness. She exhibits no effusion, no deformity, no erythema, normal alignment, no LCL laxity, normal meniscus and no MCL laxity.  ttp along left lateral patella and patellar ligament.  No effusion or edema. No crepitus with ROM.  Ankle and foot nontender.  Neurological: She is alert. She has normal strength. She displays normal reflexes. No sensory deficit.  Skin: Skin is warm and dry.  Psychiatric:  She has a normal mood and affect.    ED Course  Procedures (including critical care time) Labs Review Labs Reviewed - No data to display  Imaging Review Dg Knee Complete 4 Views Left  04/06/2014   CLINICAL DATA:  Left knee pain for 1 day.  No known injury.  EXAM: LEFT KNEE - COMPLETE 4+ VIEW  COMPARISON:  Two views left knee 04/06/2014 at 14:27 hr.  FINDINGS: Imaged bones, joints and soft tissues appear normal.  IMPRESSION: Negative exam.   Electronically Signed   By: Inge Rise M.D.   On: 04/06/2014 18:51     EKG Interpretation None      MDM   Final diagnoses:  Pain  Knee pain,  acute, left    Outside films on CD reviewed with no obvious fracture.  Repeat xrays here also revealing for no fx.  Pt was encouraged ice, elevation, rest, hydrocodone prescribed.  She does have ortho Dr. Jefm Bryant in [place], advised f/u with him if sx persist.  Discussed possible ligament or cartilage injury as source of pain, although exam is not specific for either of these conditions today.  The patient appears reasonably screened and/or stabilized for discharge and I doubt any other medical condition or other [facility] requiring further screening, evaluation, or treatment in the ED at this time prior to discharge.     [name], PA-C 04/06/14 [place], MD 04/07/14 [phone]

## 2014-04-06 NOTE — ED Notes (Signed)
Patient c/o pain to left leg. Patient seen by [facility] and had x-ray. Patient told that she had a fibula fracture. Patient denies any known injury-per patient was just pushing someone around in wheelchair yesterday when pain started. Denies taking any medications for pain.

## 2014-07-12 ENCOUNTER — Encounter (HOSPITAL_COMMUNITY): Payer: Self-pay | Admitting: *Deleted

## 2014-07-12 ENCOUNTER — Emergency Department (HOSPITAL_COMMUNITY)
Admission: EM | Admit: 2014-07-12 | Discharge: 2014-07-12 | Disposition: A | Discharge status: Home / Self Care | Payer: Self-pay | Attending: Emergency Medicine | Admitting: Emergency Medicine

## 2014-07-12 DIAGNOSIS — Z79899 Other long term (current) drug therapy: Secondary | ICD-10-CM | POA: Insufficient documentation

## 2014-07-12 DIAGNOSIS — Z720 Tobacco use: Secondary | ICD-10-CM | POA: Insufficient documentation

## 2014-07-12 DIAGNOSIS — I10 Essential (primary) hypertension: Secondary | ICD-10-CM | POA: Insufficient documentation

## 2014-07-12 DIAGNOSIS — G8929 Other chronic pain: Secondary | ICD-10-CM | POA: Insufficient documentation

## 2014-07-12 DIAGNOSIS — M5441 Lumbago with sciatica, right side: Secondary | ICD-10-CM | POA: Insufficient documentation

## 2014-07-12 MED ORDER — OXYCODONE-ACETAMINOPHEN 5-325 MG PO TABS: 2.0000 | ORAL_TABLET | ORAL | Status: DC | PRN

## 2014-07-12 MED ORDER — OXYCODONE-ACETAMINOPHEN 5-325 MG PO TABS
2.0000 | ORAL_TABLET | Freq: Once | ORAL | Status: AC
  Administered 2014-07-12: 2 via ORAL
  Filled 2014-07-12: qty 2

## 2014-07-12 MED ORDER — METHYLPREDNISOLONE 4 MG PO KIT: PACK | ORAL | Status: DC

## 2014-07-12 MED ORDER — METHOCARBAMOL 500 MG PO TABS: 500.0000 mg | ORAL_TABLET | Freq: Two times a day (BID) | ORAL | Status: DC

## 2014-07-12 MED ORDER — CYCLOBENZAPRINE HCL 10 MG PO TABS
5.0000 mg | ORAL_TABLET | Freq: Once | ORAL | Status: AC
  Administered 2014-07-12: 5 mg via ORAL
  Filled 2014-07-12: qty 1

## 2014-07-12 MED ORDER — PREDNISONE 50 MG PO TABS
60.0000 mg | ORAL_TABLET | Freq: Once | ORAL | Status: AC
  Administered 2014-07-12: 60 mg via ORAL
  Filled 2014-07-12 (×2): qty 1

## 2014-07-12 NOTE — Discharge Instructions (Signed)
Back Pain, Adult °Back pain is very common. The pain often gets better over time. The cause of back pain is usually not dangerous. Most people can learn to manage their back pain on their own.  °HOME CARE  °· Stay active. Start with short walks on flat ground if you can. Try to walk farther each day. °· Do not sit, drive, or stand in one place for more than 30 minutes. Do not stay in bed. °· Do not avoid exercise or work. Activity can help your back heal faster. °· Be careful when you bend or lift an object. Bend at your knees, keep the object close to you, and do not twist. °· Sleep on a firm mattress. Lie on your side, and bend your knees. If you lie on your back, put a pillow under your knees. °· Only take medicines as told by your doctor. °· Put ice on the injured area. °¨ Put ice in a plastic bag. °¨ Place a towel between your skin and the bag. °¨ Leave the ice on for 15-20 minutes, 03-04 times a day for the first 2 to 3 days. After that, you can switch between ice and heat packs. °· Ask your doctor about back exercises or massage. °· Avoid feeling anxious or stressed. Find good ways to deal with stress, such as exercise. °GET HELP RIGHT AWAY IF:  °· Your pain does not go away with rest or medicine. °· Your pain does not go away in 1 week. °· You have new problems. °· You do not feel well. °· The pain spreads into your legs. °· You cannot control when you poop (bowel movement) or pee (urinate). °· Your arms or legs feel weak or lose feeling (numbness). °· You feel sick to your stomach (nauseous) or throw up (vomit). °· You have belly (abdominal) pain. °· You feel like you may pass out (faint). °MAKE SURE YOU:  °· Understand these instructions. °· Will watch your condition. °· Will get help right away if you are not doing well or get worse. °Document Released: 10/17/2007 Document Revised: 07/23/2011 Document Reviewed: 09/01/2013 °[employer]® Patient Information ©2015 [employer]. This information is not intended  to replace advice given to you by your health care provider. Make sure you discuss any questions you have with your health care provider. ° °

## 2014-07-12 NOTE — ED Provider Notes (Signed)
CSN: [id number]     Arrival date & time 07/12/14  1118 History   First MD Initiated Contact with Patient 07/12/14 1304     Chief Complaint  Patient presents with  . Back Pain      HPI  Social history evaluation pain in her right back, right buttock, into her right leg. She states that she her husband was cutting firewood. She was carrying it hit her arms and down into her basement most the day yesterday. Started feeling sore Vicodin today. Had pain and spasm in her right lower back. This morning had pain down the posterior aspect of her right leg and makes it painful. No pain in the toes in a radicular fashion or into the anterior medial thigh. No "actually symptoms. Denies weakness or change in sensation. No change in bowel or bladder habits.  Medical with her back in the past but intermittent and not on a regular basis.  Past Medical History  Diagnosis Date  . Hypertension   . Migraine   . Chronic back pain    Past Surgical History  Procedure Laterality Date  . Abdominal hysterectomy    . Cholecystectomy    . Hand surgery     Family History  Problem Relation Age of Onset  . Cancer Other   . Seizures Other   . Stroke Other   . Diabetes Other    History  Substance Use Topics  . Smoking status: Current Every Day Smoker -- 1.00 packs/day for 20 years    Types: Cigarettes  . Smokeless tobacco: Never Used  . Alcohol Use: No   OB History    Gravida Para Term Preterm AB TAB SAB Ectopic Multiple Living   2 2 2       2      Review of Systems  Constitutional: Negative for fever, chills, diaphoresis, appetite change and fatigue.  HENT: Negative for mouth sores, sore throat and trouble swallowing.   Eyes: Negative for visual disturbance.  Respiratory: Negative for cough, chest tightness, shortness of breath and wheezing.   Cardiovascular: Negative for chest pain.  Gastrointestinal: Negative for nausea, vomiting, abdominal pain, diarrhea and abdominal distention.  Endocrine:  Negative for polydipsia, polyphagia and polyuria.  Genitourinary: Negative for dysuria, frequency and hematuria.  Musculoskeletal: Positive for back pain and arthralgias. Negative for gait problem.  Skin: Negative for color change, pallor and rash.  Neurological: Negative for dizziness, syncope, light-headedness and headaches.  Hematological: Does not bruise/bleed easily.  Psychiatric/Behavioral: Negative for behavioral problems and confusion.      Allergies  Levaquin; Ibuprofen; Naproxen; and Penicillins  Home Medications   Prior to Admission medications   Medication Sig Start Date End Date Taking? Authorizing Provider  cyclobenzaprine (FLEXERIL) 5 MG tablet Take 1 tablet (5 mg total) by mouth 3 (three) times daily as needed for muscle spasms. Patient not taking: Reported on 04/06/2014 06/22/13   [name], PA-C  HYDROcodone-acetaminophen (NORCO/VICODIN) 5-325 MG per tablet Take 1 tablet by mouth every 4 (four) hours as needed. Patient not taking: Reported on 07/12/2014 04/06/14   [name], PA-C  methocarbamol (ROBAXIN) 500 MG tablet Take 1 tablet (500 mg total) by mouth 2 (two) times daily. 07/12/14   Tanna Furry, MD  methylPREDNISolone (MEDROL DOSEPAK) 4 MG tablet 6poq day, then 5qd, then 4 q day, then 3 q day, then 2 q day then 1 then stop Disp: #21 07/12/14   Tanna Furry, MD  oxyCODONE-acetaminophen (PERCOCET/ROXICET) 5-325 MG per tablet Take 2 tablets by mouth  every 4 (four) hours as needed. 07/12/14   Tanna Furry, MD   BP 162/83 mmHg  Pulse 102  Temp(Src) 98.5 F (36.9 C) (Oral)  Resp 20  Ht 5\' 1"  (1.549 m)  Wt 115 lb (52.164 kg)  BMI 21.74 kg/m2  SpO2 100% Physical Exam  Constitutional: She is oriented to person, place, and time. She appears well-developed and well-nourished. No distress.  HENT:  Head: Normocephalic.  Eyes: Conjunctivae are normal. Pupils are equal, round, and reactive to light. No scleral icterus.  Neck: Normal range of motion. Neck supple. No thyromegaly  present.  Cardiovascular: Normal rate and regular rhythm.  Exam reveals no gallop and no friction rub.   No murmur heard. Pulmonary/Chest: Effort normal and breath sounds normal. No respiratory distress. She has no wheezes. She has no rales.  Abdominal: Soft. Bowel sounds are normal. She exhibits no distension. There is no tenderness. There is no rebound.  Musculoskeletal: Normal range of motion.       Legs: Neurological: She is alert and oriented to person, place, and time.  Norma symmetric strength to flex/.extend hip and knees, dorsi/plantar flex ankles. Normal symmetric sensation to all distributions to LEs Patellar and achilles reflexes 1-2+. Downgoing Babinski   Skin: Skin is warm and dry. No rash noted.  Psychiatric: She has a normal mood and affect. Her behavior is normal.    ED Course  Procedures (including critical care time) Labs Review Labs Reviewed - No data to display  Imaging Review No results found.   EKG Interpretation None      MDM   Final diagnoses:  Right-sided low back pain with right-sided sciatica   No symptoms or findings that would suggest acute radiculopathy or neurological compromise. Plan is short course steroid burst and taper with consideration to her anti-inflammatory allergy, pain medication, muscle relaxants. Slowly increasing activity as tolerated. Primary care follow-up if not improving. Educated regarding signs of acute grade nucleus. Return to ER with acute changes.   Tanna Furry, MD 07/12/14 1322

## 2014-07-12 NOTE — ED Notes (Addendum)
Pain rt lower back with radiation down rt leg, after carrying firewood  No bp med for 2 years

## 2014-11-30 ENCOUNTER — Emergency Department (HOSPITAL_COMMUNITY)
Admission: EM | Admit: 2014-11-30 | Discharge: 2014-11-30 | Disposition: A | Discharge status: Home / Self Care | Payer: Self-pay | Attending: Emergency Medicine | Admitting: Emergency Medicine

## 2014-11-30 ENCOUNTER — Encounter (HOSPITAL_COMMUNITY): Payer: Self-pay

## 2014-11-30 DIAGNOSIS — G8929 Other chronic pain: Secondary | ICD-10-CM | POA: Insufficient documentation

## 2014-11-30 DIAGNOSIS — Z8679 Personal history of other diseases of the circulatory system: Secondary | ICD-10-CM | POA: Insufficient documentation

## 2014-11-30 DIAGNOSIS — Z880 Allergy status to penicillin: Secondary | ICD-10-CM | POA: Insufficient documentation

## 2014-11-30 DIAGNOSIS — I10 Essential (primary) hypertension: Secondary | ICD-10-CM | POA: Insufficient documentation

## 2014-11-30 DIAGNOSIS — G5602 Carpal tunnel syndrome, left upper limb: Secondary | ICD-10-CM | POA: Insufficient documentation

## 2014-11-30 DIAGNOSIS — Z720 Tobacco use: Secondary | ICD-10-CM | POA: Insufficient documentation

## 2014-11-30 MED ORDER — HYDROCODONE-ACETAMINOPHEN 5-325 MG PO TABS: ORAL_TABLET | ORAL | Status: DC

## 2014-11-30 NOTE — Discharge Instructions (Signed)
Carpal Tunnel Syndrome The carpal tunnel is an area under the skin of the palm of your hand. Nerves, blood vessels, and strong tissues (tendons) pass through the tunnel. The tunnel can become puffy (swollen). If this happens, a nerve can be pinched in the wrist. This causes carpal tunnel syndrome.  HOME CARE  Take all medicine as told by your doctor.  If you were given a splint, wear it as told. Wear it at night or at times when your doctor told you to.  Rest your wrist from the activity that causes your pain.  Put ice on your wrist after long periods of wrist activity.  Put ice in a plastic bag.  Place a towel between your skin and the bag.  Leave the ice on for 15-20 minutes, 03-04 times a day.  Keep all doctor visits as told. GET HELP RIGHT AWAY IF:  You have new problems you cannot explain.  Your problems get worse and medicine does not help. MAKE SURE YOU:   Understand these instructions.  Will watch your condition.  Will get help right away if you are not doing well or get worse. Document Released: 04/19/2011 Document Revised: 07/23/2011 Document Reviewed: 04/19/2011 [facility] Patient Information 2015 [place], [place]. This information is not intended to replace advice given to you by your health care provider. Make sure you discuss any questions you have with your health care provider.

## 2014-11-30 NOTE — ED Notes (Signed)
[name] at bedside. 

## 2014-11-30 NOTE — ED Notes (Signed)
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  Reports pain in left forearm radiating to left hand, thumb, index, and middle fingers x 2 days.

## 2014-12-01 NOTE — ED Provider Notes (Signed)
CSN: [id number]     Arrival date & time 11/30/14  0907 History   First MD Initiated Contact with Patient 11/30/14 [phone]     Chief Complaint  Patient presents with  . Hand Pain     (Consider location/radiation/quality/duration/timing/severity/associated sxs/prior Treatment) HPI   [name] is a 44 y.o. female who presents to the Emergency Department complaining of left wrist pain and intermittent numbness and tingling for 2 days.  She reports sx's to her wrist that radiate into her left forearm.  She states that her job requires frequent, lifting and use of her hands.  Sx's are worse with movement of her wrist.  She describes numbness and tingling to her thumb, index and middle fingers. She admits to having carpal tunnel surgery "years ago" on the right side.  She denies shoulder or neck pain, swelling, discoloration or known injury.  She has taken tylenol without relief.     Past Medical History  Diagnosis Date  . Hypertension   . Migraine   . Chronic back pain    Past Surgical History  Procedure Laterality Date  . Abdominal hysterectomy    . Cholecystectomy    . Hand surgery     Family History  Problem Relation Age of Onset  . Cancer Other   . Seizures Other   . Stroke Other   . Diabetes Other    History  Substance Use Topics  . Smoking status: Current Every Day Smoker -- 1.00 packs/day for 20 years    Types: Cigarettes  . Smokeless tobacco: Never Used  . Alcohol Use: No   OB History    Gravida Para Term Preterm AB TAB SAB Ectopic Multiple Living   2 2 2       2      Review of Systems  Constitutional: Negative for fever and chills.  Gastrointestinal: Negative for nausea and vomiting.  Genitourinary: Negative for dysuria and difficulty urinating.  Musculoskeletal: Positive for joint swelling and arthralgias. Negative for neck pain.  Skin: Negative for color change and wound.  Neurological: Positive for numbness (numbness and tingling to her left hand and  fingers.). Negative for weakness.  All other systems reviewed and are negative.     Allergies  Levaquin; Ibuprofen; Macrobid; Naproxen; and Penicillins  Home Medications   Prior to Admission medications   Medication Sig Start Date End Date Taking? Authorizing Provider  acetaminophen (TYLENOL) 500 MG tablet Take 1,000 mg by mouth every 6 (six) hours as needed for mild pain or moderate pain.   Yes Historical Provider, MD  HYDROcodone-acetaminophen (NORCO/VICODIN) 5-325 MG per tablet Take one-two tabs po q 4-6 hrs prn pain 11/30/14   Nayleen Janosik, PA-C   BP 185/90 mmHg  Pulse 111  Temp(Src) 98.2 F (36.8 C) (Oral)  Resp 20  Ht 5\' 1"  (1.549 m)  Wt 125 lb (56.7 kg)  BMI 23.63 kg/m2  SpO2 100% Physical Exam  Constitutional: She is oriented to person, place, and time. She appears well-developed and well-nourished. No distress.  HENT:  Head: Normocephalic and atraumatic.  Neck: Normal range of motion. Neck supple.  Cardiovascular: Normal rate, regular rhythm, normal heart sounds and intact distal pulses.   Pulmonary/Chest: Effort normal and breath sounds normal. No respiratory distress. She exhibits no tenderness.  Musculoskeletal: She exhibits tenderness. She exhibits no edema.  positive Tinel's sign on exam.  Radial pulse is brisk, distal sensation intact.  CR< 2 sec.  No erythema, edema or bony deformity.  Patient has full ROM.  Compartments soft.  Neurological: She is alert and oriented to person, place, and time. She exhibits normal muscle tone. Coordination normal.  Skin: Skin is warm and dry.  Nursing note and vitals reviewed.   ED Course  Procedures (including critical care time) Labs Review Labs Reviewed - No data to display  Imaging Review No results found.   EKG Interpretation None      MDM   Final diagnoses:  Carpal tunnel syndrome of left wrist    Pt with pain, intermittent paraesthesias along the median nerve route.  Sx's likely related to carpal tunnel  syndrome.  No hx of trauma or clinical concern for infection to indicate need for imaging at this time.   Cock splint applied, pain improved, remains NV intact.  Agrees to symptomatic tx and ortho f/u    Kem Parkinson, PA-C 12/01/14 0935  Milton Ferguson, MD 12/02/14 [phone]

## 2015-01-03 ENCOUNTER — Emergency Department (HOSPITAL_COMMUNITY): Payer: Self-pay

## 2015-01-03 ENCOUNTER — Emergency Department (HOSPITAL_COMMUNITY)
Admission: EM | Admit: 2015-01-03 | Discharge: 2015-01-03 | Disposition: A | Discharge status: Home / Self Care | Payer: Self-pay | Attending: Emergency Medicine | Admitting: Emergency Medicine

## 2015-01-03 ENCOUNTER — Encounter (HOSPITAL_COMMUNITY): Payer: Self-pay | Admitting: *Deleted

## 2015-01-03 DIAGNOSIS — S199XXA Unspecified injury of neck, initial encounter: Secondary | ICD-10-CM | POA: Insufficient documentation

## 2015-01-03 DIAGNOSIS — Y998 Other external cause status: Secondary | ICD-10-CM | POA: Insufficient documentation

## 2015-01-03 DIAGNOSIS — R51 Headache: Secondary | ICD-10-CM

## 2015-01-03 DIAGNOSIS — W01198A Fall on same level from slipping, tripping and stumbling with subsequent striking against other object, initial encounter: Secondary | ICD-10-CM | POA: Insufficient documentation

## 2015-01-03 DIAGNOSIS — Y9389 Activity, other specified: Secondary | ICD-10-CM | POA: Insufficient documentation

## 2015-01-03 DIAGNOSIS — G8929 Other chronic pain: Secondary | ICD-10-CM | POA: Insufficient documentation

## 2015-01-03 DIAGNOSIS — H53149 Visual discomfort, unspecified: Secondary | ICD-10-CM | POA: Insufficient documentation

## 2015-01-03 DIAGNOSIS — Z880 Allergy status to penicillin: Secondary | ICD-10-CM | POA: Insufficient documentation

## 2015-01-03 DIAGNOSIS — Z720 Tobacco use: Secondary | ICD-10-CM | POA: Insufficient documentation

## 2015-01-03 DIAGNOSIS — S0990XA Unspecified injury of head, initial encounter: Secondary | ICD-10-CM | POA: Insufficient documentation

## 2015-01-03 DIAGNOSIS — R519 Headache, unspecified: Secondary | ICD-10-CM

## 2015-01-03 DIAGNOSIS — Y92480 Sidewalk as the place of occurrence of the external cause: Secondary | ICD-10-CM | POA: Insufficient documentation

## 2015-01-03 DIAGNOSIS — I10 Essential (primary) hypertension: Secondary | ICD-10-CM | POA: Insufficient documentation

## 2015-01-03 IMAGING — CT CT CERVICAL SPINE W/O CM
3 of 5 series · 9 of 33 positions shown, 11 images · non-contrast
Comparison: [DATE]

CLINICAL DATA: Fell on [REDACTED] and hit posterior head. Posterior
neck pain. Headache since fall.

EXAM:
CT HEAD WITHOUT CONTRAST
CT CERVICAL SPINE WITHOUT CONTRAST
TECHNIQUE: Multidetector CT imaging of the head and cervical spine was
performed following the standard protocol without intravenous
contrast. Multiplanar CT image reconstructions of the cervical spine
were also generated.

[Series 5: cervical st 2.0 b31s · axial · 0.30mm/px · z∈[+162,+162]mm · 1 of 85 slices shown, 2 images]
[im 51/85  soft-tissue]
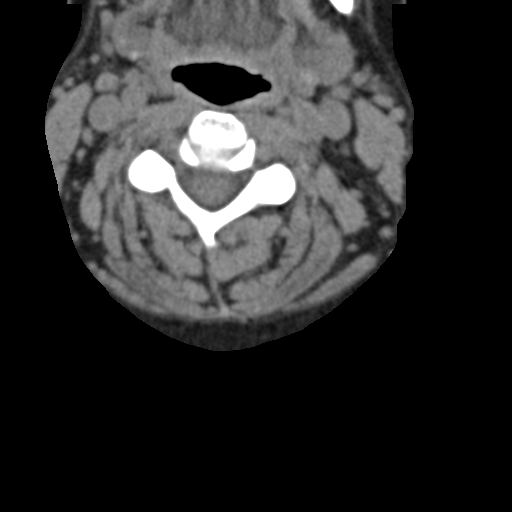
[im 51/85  bone]
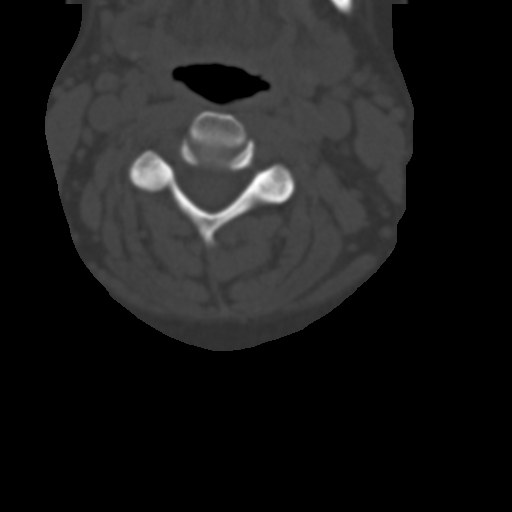

[Series 7: sagittal bone 2.0 · sagittal · 0.18mm/px · 5 of 60 slices shown, 6 images]
[im 20/60  bone]
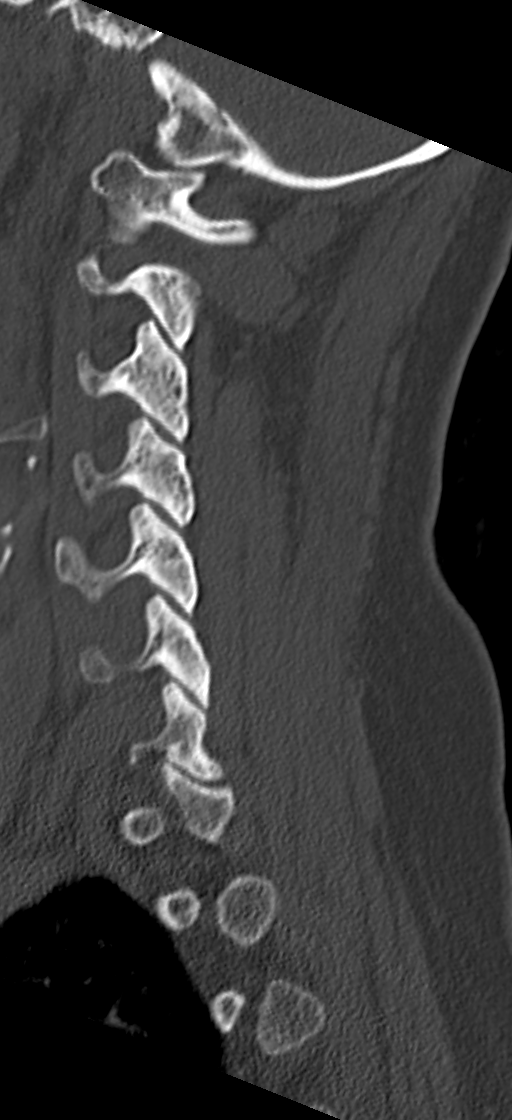
[im 25/60  bone]
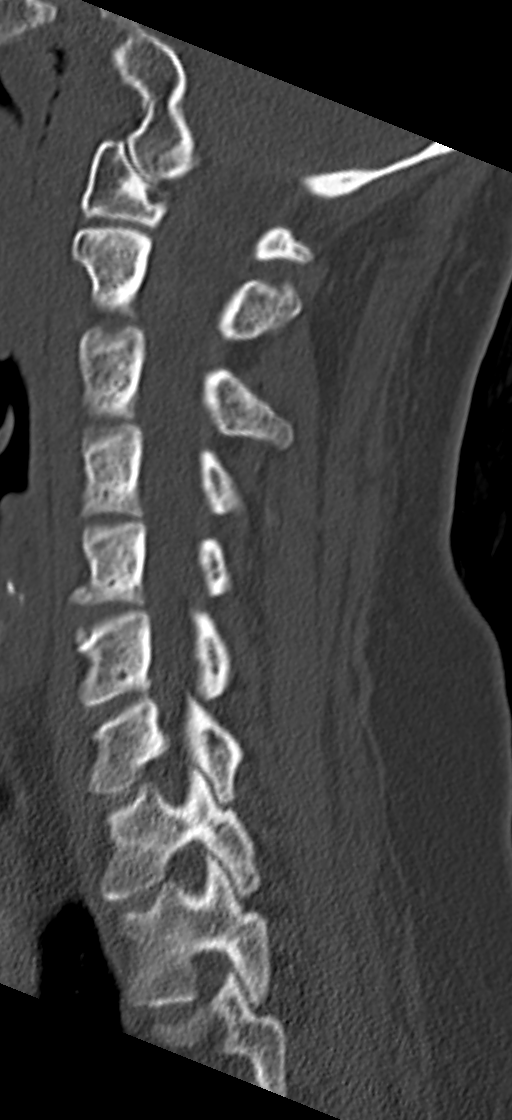
[im 30/60  soft-tissue]
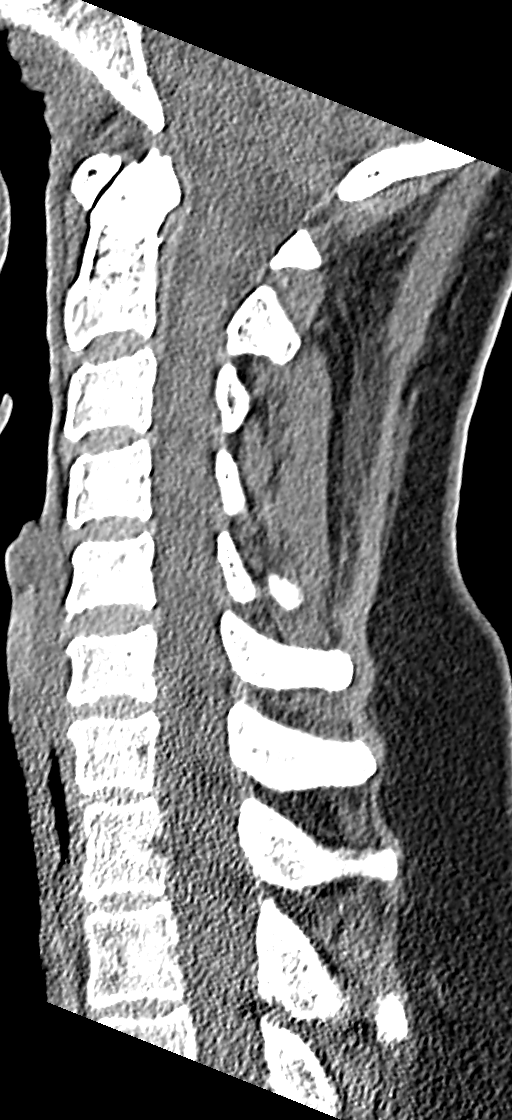
[im 30/60  bone]
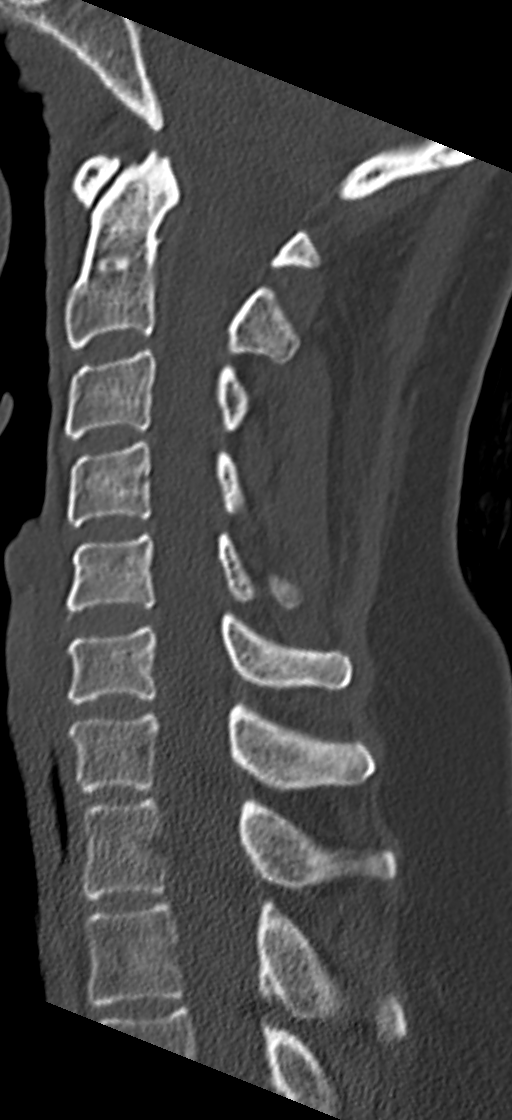
[im 35/60  bone]
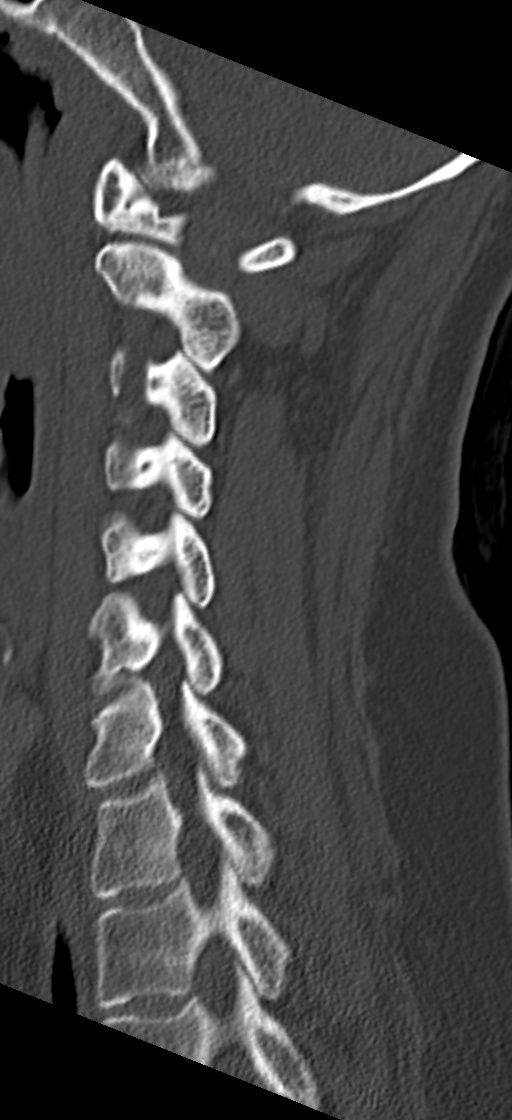
[im 40/60  bone]
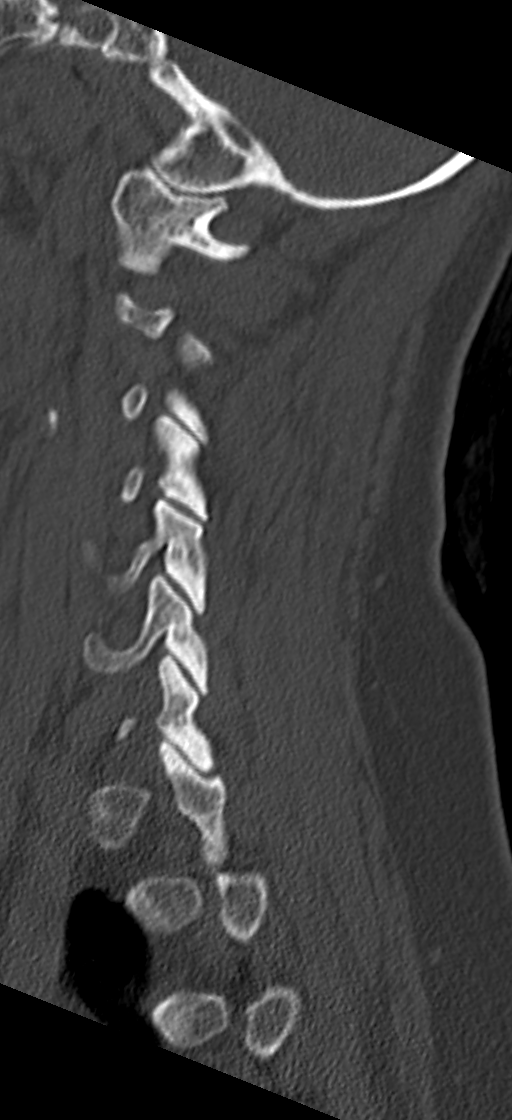

[Series 8: coronal bone 2.0 · coronal · 0.21mm/px · 3 of 44 slices shown]
[im 9/44  bone]
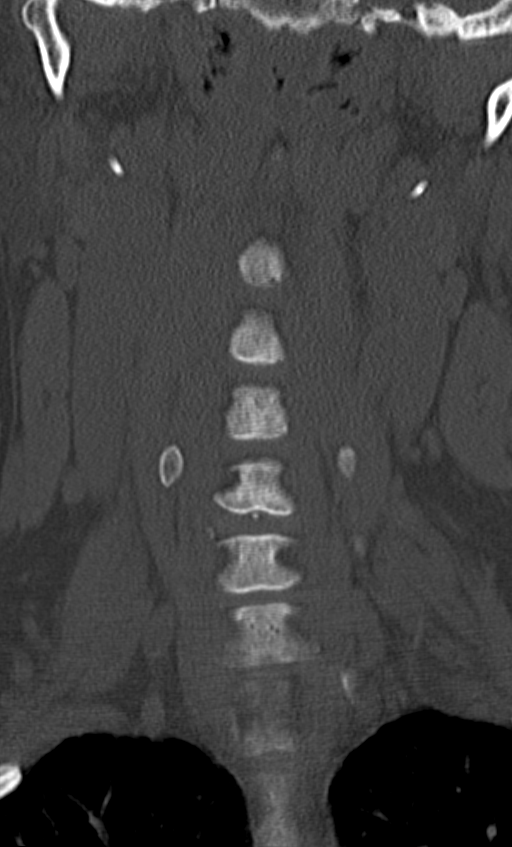
[im 18/44  bone]
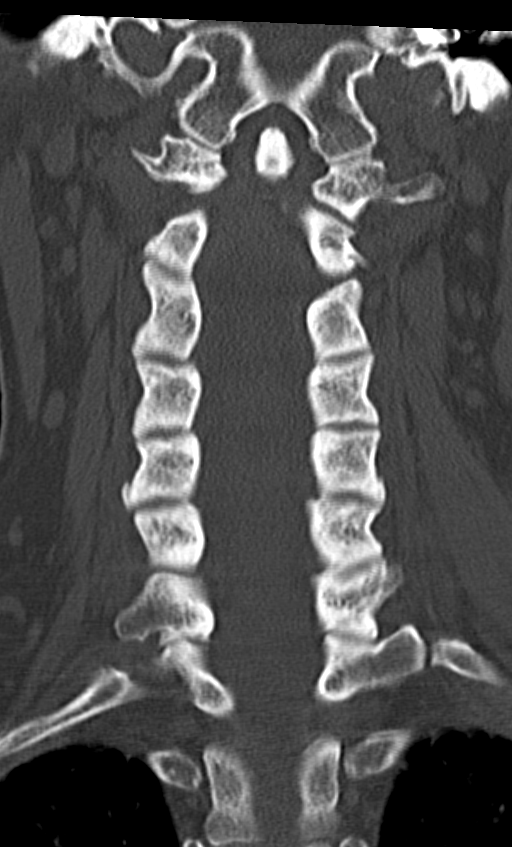
[im 26/44  bone]
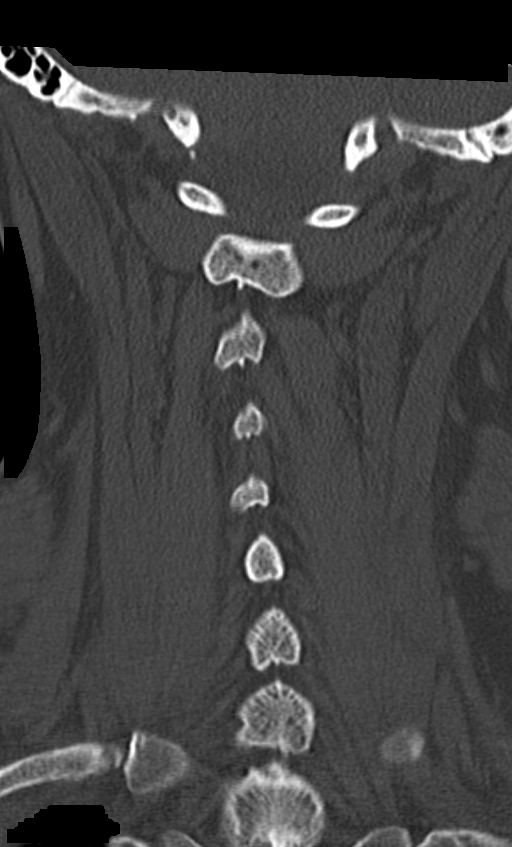

[9 of 33 positions shown; findings below may reference images not displayed]

FINDINGS: CT HEAD FINDINGS

No evidence for acute hemorrhage, mass lesion, midline shift,
hydrocephalus or large infarct. Mastoid air cells and visualized
paranasal sinuses are clear. No acute bone abnormality.

CT CERVICAL SPINE FINDINGS

Incomplete fusion along the posterior arch of C1. Negative for an
acute fracture or dislocation. Small pleural based nodule along the
right lung apex measures up to 4 mm and could represent scarring but
indeterminate. Negative for pneumothorax. No gross abnormality to
the thyroid tissue. No significant soft tissue swelling. Normal
alignment in the cervical spine.
IMPRESSION: No acute intracranial abnormality.

No acute bone abnormality in the cervical spine.

Indeterminate 4 mm pleural-based nodule along the right lung apex.
If the patient is at high risk for bronchogenic carcinoma, follow-up
chest CT at 1 year is recommended. If the patient is at low risk, no
follow-up is needed. This recommendation follows the consensus
statement: Guidelines for Management of Small Pulmonary Nodules
Detected on CT Scans: A Statement from the [HOSPITAL] as

## 2015-01-03 MED ORDER — KETOROLAC TROMETHAMINE 30 MG/ML IJ SOLN
30.0000 mg | Freq: Once | INTRAMUSCULAR | Status: AC
  Administered 2015-01-03: 30 mg via INTRAVENOUS
  Filled 2015-01-03: qty 1

## 2015-01-03 MED ORDER — FENTANYL CITRATE (PF) 100 MCG/2ML IJ SOLN
50.0000 ug | Freq: Once | INTRAMUSCULAR | Status: AC
  Administered 2015-01-03: 50 ug via INTRAVENOUS
  Filled 2015-01-03: qty 2

## 2015-01-03 MED ORDER — DEXAMETHASONE SODIUM PHOSPHATE 4 MG/ML IJ SOLN
10.0000 mg | Freq: Once | INTRAMUSCULAR | Status: AC
  Administered 2015-01-03: 10 mg via INTRAVENOUS
  Filled 2015-01-03: qty 3

## 2015-01-03 MED ORDER — DIPHENHYDRAMINE HCL 50 MG/ML IJ SOLN
25.0000 mg | Freq: Once | INTRAMUSCULAR | Status: AC
  Administered 2015-01-03: 25 mg via INTRAVENOUS
  Filled 2015-01-03: qty 1

## 2015-01-03 MED ORDER — METOCLOPRAMIDE HCL 5 MG/ML IJ SOLN
10.0000 mg | Freq: Once | INTRAMUSCULAR | Status: AC
  Administered 2015-01-03: 10 mg via INTRAVENOUS
  Filled 2015-01-03: qty 2

## 2015-01-03 MED ORDER — SODIUM CHLORIDE 0.9 % IV BOLUS (SEPSIS)
1000.0000 mL | Freq: Once | INTRAVENOUS | Status: AC
  Administered 2015-01-03: 1000 mL via INTRAVENOUS

## 2015-01-03 MED ORDER — VALPROATE SODIUM 500 MG/5ML IV SOLN
500.0000 mg | Freq: Once | INTRAVENOUS | Status: AC
  Administered 2015-01-03: 500 mg via INTRAVENOUS
  Filled 2015-01-03: qty 5

## 2015-01-03 NOTE — ED Provider Notes (Signed)
CSN: [id number]     Arrival date & time 01/03/15  1244 History   First MD Initiated Contact with Patient 01/03/15 1304     Chief Complaint  Patient presents with  . Migraine     (Consider location/radiation/quality/duration/timing/severity/associated sxs/prior Treatment) HPI Comments: Patient states she tripped and fell 3 days ago on the sidewalk and struck her head. She did not lose consciousness. She had a dull headache since then. Last night she developed a "migraine" that is gradual in onset and similar to previous headaches. This is associated with nausea and photophobia and phonophobia. Denies vomiting. Denies fever. Denies focal weakness, numbness or tingling. No bowel or bladder incontinence. No chest pain shortness of breath. States this headache feels a usual migraine and is different than when she hit her head. She does not take any blood thinners. She endorses some blurry vision but no double vision.  The history is provided by the patient.    Past Medical History  Diagnosis Date  . Hypertension   . Migraine   . Chronic back pain    Past Surgical History  Procedure Laterality Date  . Abdominal hysterectomy    . Cholecystectomy    . Hand surgery     Family History  Problem Relation Age of Onset  . Cancer Other   . Seizures Other   . Stroke Other   . Diabetes Other    Social History  Substance Use Topics  . Smoking status: Current Every Day Smoker -- 1.00 packs/day for 20 years    Types: Cigarettes  . Smokeless tobacco: Never Used  . Alcohol Use: No   OB History    Gravida Para Term Preterm AB TAB SAB Ectopic Multiple Living   2 2 2       2      Review of Systems  Constitutional: Negative for fever and activity change.  HENT: Negative for congestion and rhinorrhea.   Eyes: Positive for photophobia and visual disturbance.  Respiratory: Negative for cough, chest tightness and shortness of breath.   Cardiovascular: Negative for chest pain.  Gastrointestinal:  Positive for nausea. Negative for vomiting and abdominal pain.  Genitourinary: Negative for dysuria, hematuria, vaginal bleeding and vaginal discharge.  Musculoskeletal: Negative for myalgias and arthralgias.  Skin: Negative for rash.  Neurological: Positive for headaches. Negative for dizziness, weakness, light-headedness and numbness.  A complete 10 system review of systems was obtained and all systems are negative except as noted in the HPI and PMH.      Allergies  Levaquin; Ibuprofen; Macrobid; Naproxen; and Penicillins  Home Medications   Prior to Admission medications   Medication Sig Start Date End Date Taking? Authorizing Provider  acetaminophen (TYLENOL) 500 MG tablet Take 1,000 mg by mouth every 6 (six) hours as needed for mild pain, moderate pain or headache.    Yes Historical Provider, MD   BP 163/87 mmHg  Pulse 84  Temp(Src) 98 F (36.7 C) (Oral)  Resp 20  Ht 5\' 1"  (1.549 m)  Wt 125 lb (56.7 kg)  BMI 23.63 kg/m2  SpO2 100% Physical Exam  Constitutional: She is oriented to person, place, and time. She appears well-developed and well-nourished. No distress.  HENT:  Head: Normocephalic and atraumatic.  Mouth/Throat: Oropharynx is clear and moist. No oropharyngeal exudate.  Eyes: Conjunctivae and EOM are normal. Pupils are equal, round, and reactive to light.  Photophobic, no nystagmus  Neck: Normal range of motion. Neck supple.  No meningismus. Diffuse paraspinal C-spine tenderness  Cardiovascular: Normal  rate, regular rhythm, normal heart sounds and intact distal pulses.   No murmur heard. Pulmonary/Chest: Effort normal and breath sounds normal. No respiratory distress.  Abdominal: Soft. There is no tenderness. There is no rebound and no guarding.  Musculoskeletal: Normal range of motion. She exhibits no edema or tenderness.  Neurological: She is alert and oriented to person, place, and time. No cranial nerve deficit. She exhibits normal muscle tone. Coordination  normal.  No ataxia on finger to nose bilaterally. No pronator drift. 5/5 strength throughout. CN 2-12 intact. Equal grip strength. Sensation intact.   Skin: Skin is warm.  Psychiatric: She has a normal mood and affect. Her behavior is normal.  Nursing note and vitals reviewed.   ED Course  Procedures (including critical care time) Labs Review Labs Reviewed - No data to display  Imaging Review Ct Head Wo Contrast  01/03/2015   CLINICAL DATA:  [place] on Friday and hit posterior head. Posterior neck pain. Headache since fall.  EXAM: CT HEAD WITHOUT CONTRAST  CT CERVICAL SPINE WITHOUT CONTRAST  TECHNIQUE: Multidetector CT imaging of the head and cervical spine was performed following the standard protocol without intravenous contrast. Multiplanar CT image reconstructions of the cervical spine were also generated.  COMPARISON:  07/19/2012  FINDINGS: CT HEAD FINDINGS  No evidence for acute hemorrhage, mass lesion, midline shift, hydrocephalus or large infarct. Mastoid air cells and visualized paranasal sinuses are clear. No acute bone abnormality.  CT CERVICAL SPINE FINDINGS  Incomplete fusion along the posterior arch of C1. Negative for an acute fracture or dislocation. Small pleural based nodule along the right lung apex measures up to 4 mm and could represent scarring but indeterminate. Negative for pneumothorax. No gross abnormality to the thyroid tissue. No significant soft tissue swelling. Normal alignment in the cervical spine.  IMPRESSION: No acute intracranial abnormality.  No acute bone abnormality in the cervical spine.  Indeterminate 4 mm pleural-based nodule along the right lung apex. If the patient is at high risk for bronchogenic carcinoma, follow-up chest CT at 1 year is recommended. If the patient is at low risk, no follow-up is needed. This recommendation follows the consensus statement: Guidelines for Management of Small Pulmonary Nodules Detected on CT Scans: A Statement from the [employer] as published in Radiology 2005; 237:395-400.   Electronically Signed   By: Markus Daft M.D.   On: 01/03/2015 14:27   Ct Cervical Spine Wo Contrast  01/03/2015   CLINICAL DATA:  [place] on Friday and hit posterior head. Posterior neck pain. Headache since fall.  EXAM: CT HEAD WITHOUT CONTRAST  CT CERVICAL SPINE WITHOUT CONTRAST  TECHNIQUE: Multidetector CT imaging of the head and cervical spine was performed following the standard protocol without intravenous contrast. Multiplanar CT image reconstructions of the cervical spine were also generated.  COMPARISON:  07/19/2012  FINDINGS: CT HEAD FINDINGS  No evidence for acute hemorrhage, mass lesion, midline shift, hydrocephalus or large infarct. Mastoid air cells and visualized paranasal sinuses are clear. No acute bone abnormality.  CT CERVICAL SPINE FINDINGS  Incomplete fusion along the posterior arch of C1. Negative for an acute fracture or dislocation. Small pleural based nodule along the right lung apex measures up to 4 mm and could represent scarring but indeterminate. Negative for pneumothorax. No gross abnormality to the thyroid tissue. No significant soft tissue swelling. Normal alignment in the cervical spine.  IMPRESSION: No acute intracranial abnormality.  No acute bone abnormality in the cervical spine.  Indeterminate 4 mm pleural-based nodule along  the right lung apex. If the patient is at high risk for bronchogenic carcinoma, follow-up chest CT at 1 year is recommended. If the patient is at low risk, no follow-up is needed. This recommendation follows the consensus statement: Guidelines for Management of Small Pulmonary Nodules Detected on CT Scans: A Statement from the [employer] as published in Radiology 2005; 237:395-400.   Electronically Signed   By: Markus Daft M.D.   On: 01/03/2015 14:27   I have personally reviewed and evaluated these images and lab results as part of my medical decision-making.   EKG Interpretation None       MDM   Final diagnoses:  Headache, unspecified headache type   Head trauma 3 days ago now with "migraine" associated with nausea and photophobia. Nonfocal neurological exam.  Low suspicion for [facility] or meningitis. CT head and C-spine negative. Incidental finding of lung nodule discussed with patient.  She reports no improvement with Toradol, Reglan, Benadryl. She'll be given Decadron and Depacon.  Headache is improved with above treatment. No vomiting. She is tolerating by mouth and ambulatory.  Discussed that she may have a possible concussion as well as her migraine headache. Advised she could have ongoing headaches for several weeks with dizziness and nausea. Advised not to reinjure herself. She is advised of the lung nodule need for follow-up.  Return precautions discussed.  Ezequiel Essex, MD 01/03/15 2131

## 2015-01-03 NOTE — ED Notes (Signed)
Pt reports no change to headache.

## 2015-01-03 NOTE — Discharge Instructions (Signed)
General Headache Without Cause Avoid any injury to your head. You may continue to have headaches, dizziness and nausea for several weeks if you had a concussion. You need to have a CT scan of your lungs in 1 year to evaluate the lung nodule. Return to the ED if you develop new or worsening symptoms. A headache is pain or discomfort felt around the head or neck area. The specific cause of a headache may not be found. There are many causes and types of headaches. A few common ones are:  Tension headaches.  Migraine headaches.  Cluster headaches.  Chronic daily headaches. HOME CARE INSTRUCTIONS   Keep all follow-up appointments with your caregiver or any specialist referral.  Only take over-the-counter or prescription medicines for pain or discomfort as directed by your caregiver.  Lie down in a dark, quiet room when you have a headache.  Keep a headache journal to find out what may trigger your migraine headaches. For example, write down:  What you eat and drink.  How much sleep you get.  Any change to your diet or medicines.  Try massage or other relaxation techniques.  Put ice packs or heat on the head and neck. Use these 3 to 4 times per day for 15 to 20 minutes each time, or as needed.  Limit stress.  Sit up straight, and do not tense your muscles.  Quit smoking if you smoke.  Limit alcohol use.  Decrease the amount of caffeine you drink, or stop drinking caffeine.  Eat and sleep on a regular schedule.  Get 7 to 9 hours of sleep, or as recommended by your caregiver.  Keep lights dim if bright lights bother you and make your headaches worse. SEEK MEDICAL CARE IF:   You have problems with the medicines you were prescribed.  Your medicines are not working.  You have a change from the usual headache.  You have nausea or vomiting. SEEK IMMEDIATE MEDICAL CARE IF:   Your headache becomes severe.  You have a fever.  You have a stiff neck.  You have loss of  vision.  You have muscular weakness or loss of muscle control.  You start losing your balance or have trouble walking.  You feel faint or pass out.  You have severe symptoms that are different from your first symptoms. MAKE SURE YOU:   Understand these instructions.  Will watch your condition.  Will get help right away if you are not doing well or get worse. Document Released: 04/30/2005 Document Revised: 07/23/2011 Document Reviewed: 05/16/2011 [facility] Patient Information 2015 [place], [place]. This information is not intended to replace advice given to you by your health care provider. Make sure you discuss any questions you have with your health care provider.

## 2015-01-03 NOTE — ED Notes (Signed)
Patient c/o migraine that started yesterday. Light sensitivity and nausea.

## 2015-02-28 ENCOUNTER — Emergency Department (HOSPITAL_COMMUNITY): Payer: Self-pay

## 2015-02-28 ENCOUNTER — Emergency Department (HOSPITAL_COMMUNITY)
Admission: EM | Admit: 2015-02-28 | Discharge: 2015-02-28 | Disposition: A | Discharge status: Home / Self Care | Payer: Self-pay | Attending: Emergency Medicine | Admitting: Emergency Medicine

## 2015-02-28 ENCOUNTER — Encounter (HOSPITAL_COMMUNITY): Payer: Self-pay | Admitting: Emergency Medicine

## 2015-02-28 DIAGNOSIS — Y998 Other external cause status: Secondary | ICD-10-CM | POA: Insufficient documentation

## 2015-02-28 DIAGNOSIS — I10 Essential (primary) hypertension: Secondary | ICD-10-CM | POA: Insufficient documentation

## 2015-02-28 DIAGNOSIS — Z720 Tobacco use: Secondary | ICD-10-CM | POA: Insufficient documentation

## 2015-02-28 DIAGNOSIS — Z880 Allergy status to penicillin: Secondary | ICD-10-CM | POA: Insufficient documentation

## 2015-02-28 DIAGNOSIS — Y9289 Other specified places as the place of occurrence of the external cause: Secondary | ICD-10-CM | POA: Insufficient documentation

## 2015-02-28 DIAGNOSIS — Y9389 Activity, other specified: Secondary | ICD-10-CM | POA: Insufficient documentation

## 2015-02-28 DIAGNOSIS — G8929 Other chronic pain: Secondary | ICD-10-CM | POA: Insufficient documentation

## 2015-02-28 DIAGNOSIS — W108XXA Fall (on) (from) other stairs and steps, initial encounter: Secondary | ICD-10-CM | POA: Insufficient documentation

## 2015-02-28 DIAGNOSIS — S8002XA Contusion of left knee, initial encounter: Secondary | ICD-10-CM | POA: Insufficient documentation

## 2015-02-28 IMAGING — DX DG KNEE COMPLETE 4+V*L*
4 series · 4 of 4 positions shown · non-contrast
Comparison: [DATE]

CLINICAL DATA: Left knee pain after falling on steps. Initial
encounter.

EXAM:
LEFT KNEE - COMPLETE 4+ VIEW

[knee ap]
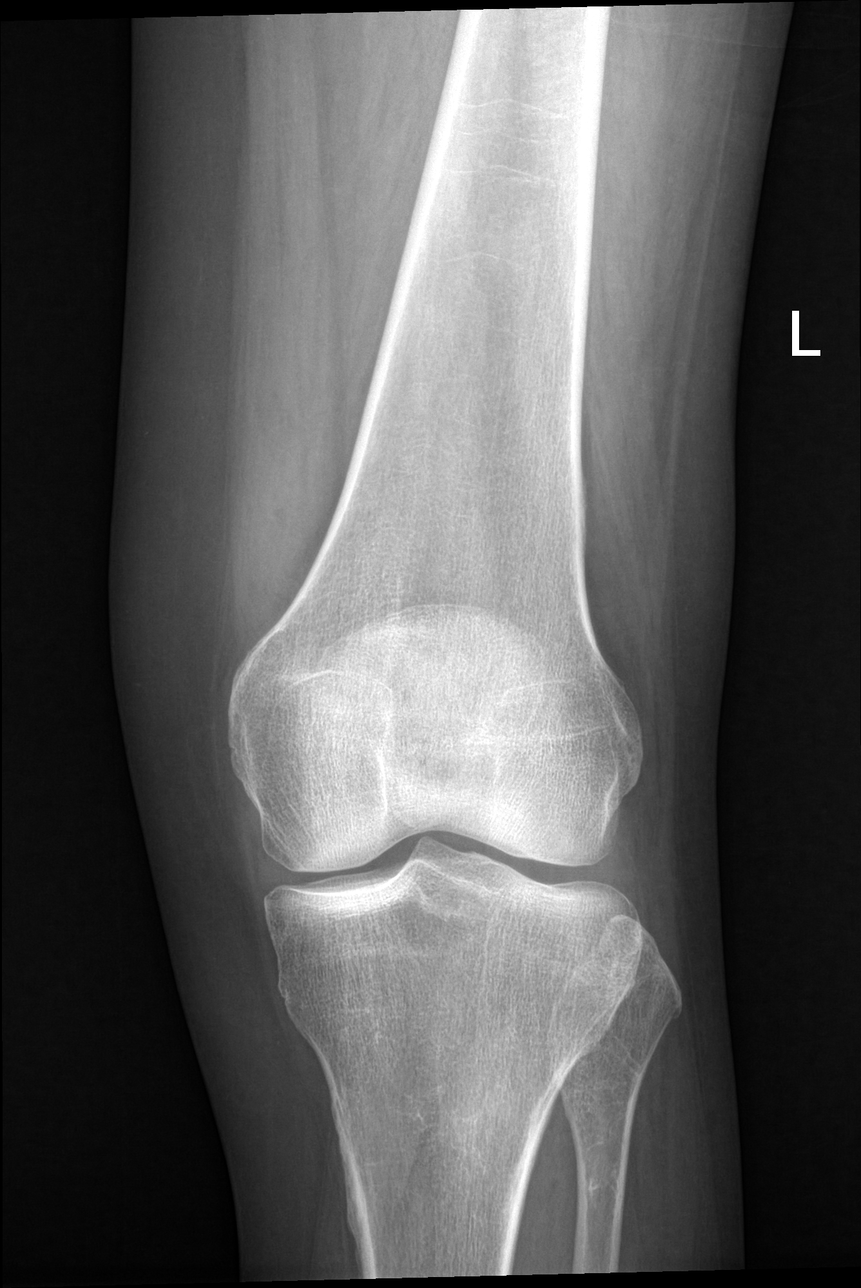

[knee obl (1 of 2)]
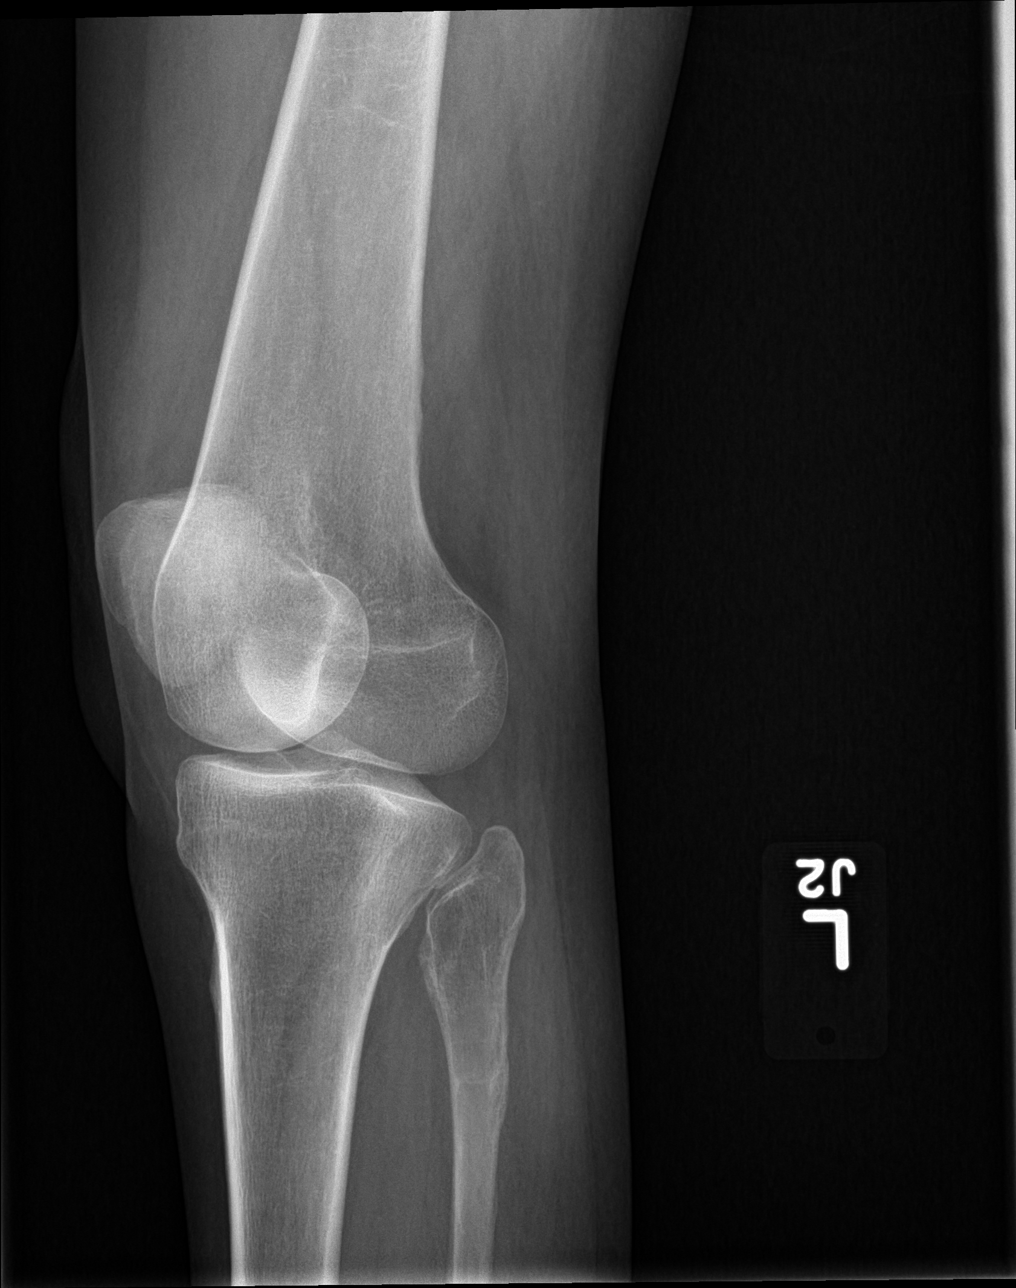

[knee obl (2 of 2)]
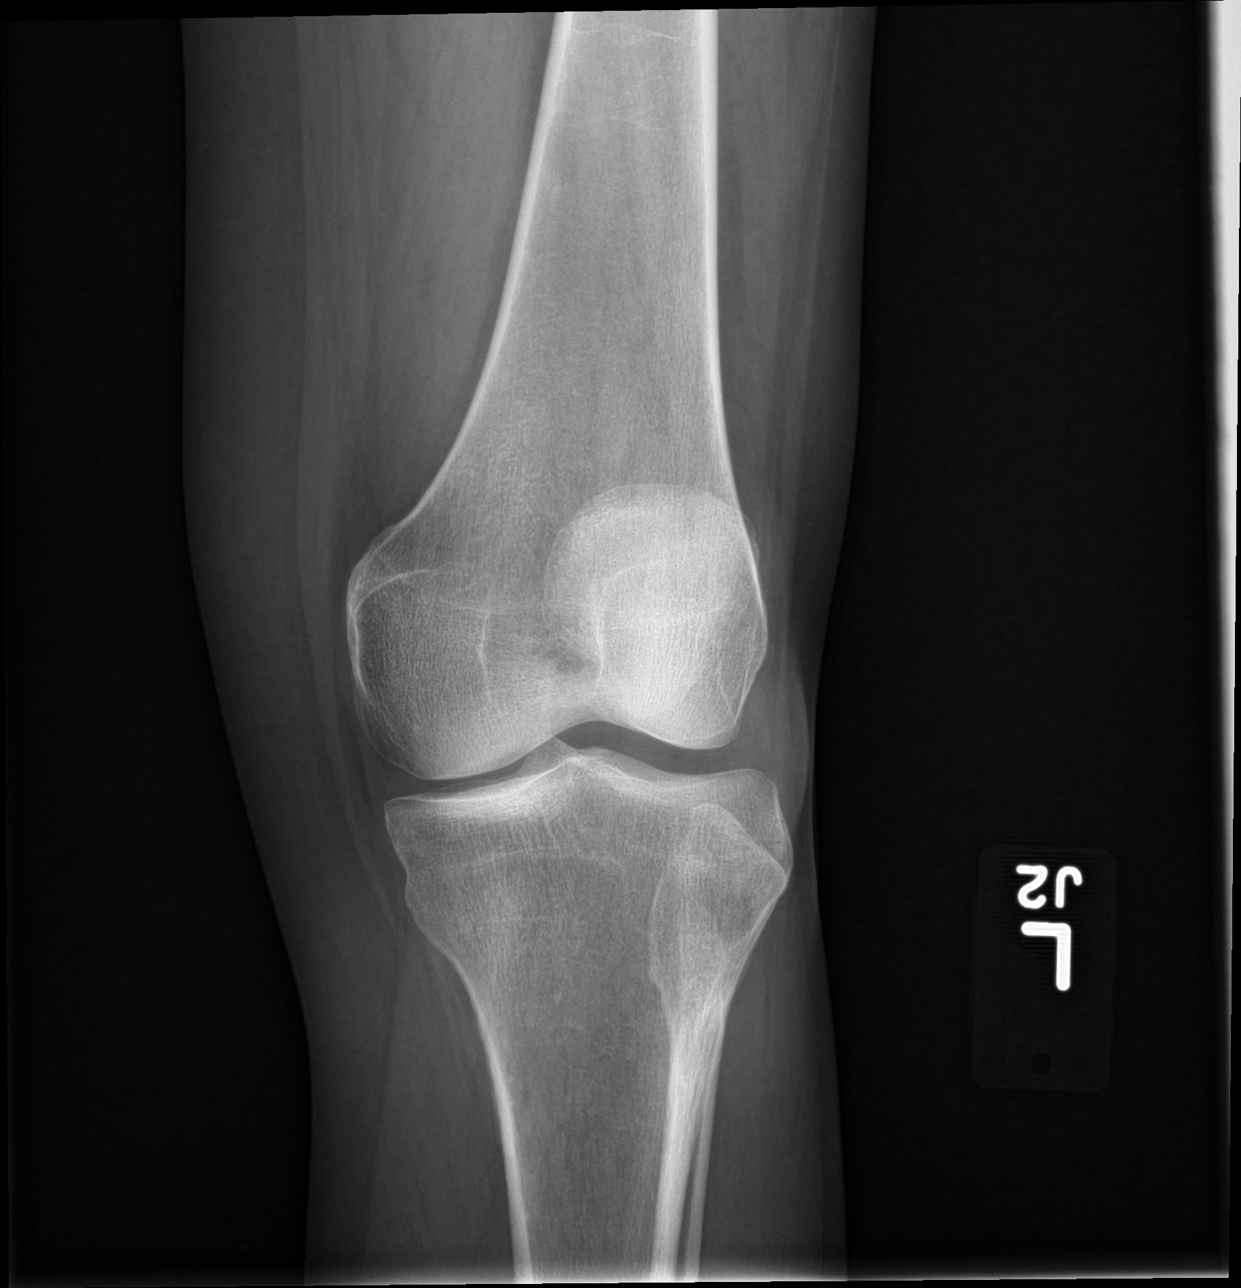

[knee lat]
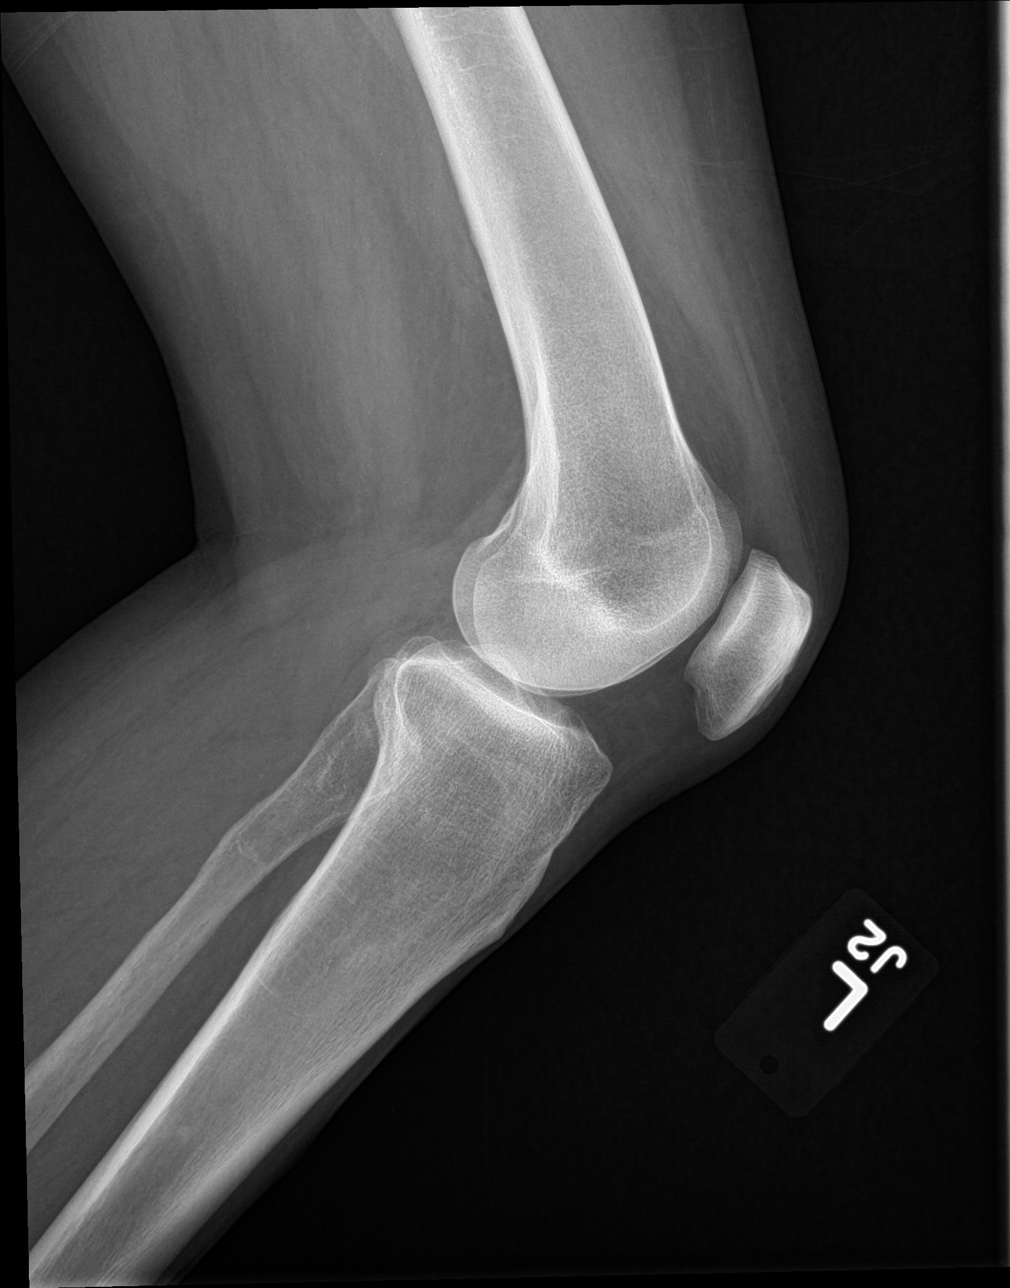

[4 of 4 positions shown; findings below may reference images not displayed]

FINDINGS: There is no evidence of fracture, dislocation, or joint effusion.
There is no evidence of arthropathy or other focal bone abnormality.
Soft tissues are unremarkable.
IMPRESSION: Negative.

## 2015-02-28 NOTE — ED Provider Notes (Signed)
CSN: [id number]     Arrival date & time 02/28/15  1748 History  By signing my name below, I, [name], attest that this documentation has been prepared under the direction and in the presence of Lily Kocher, PA-C.  Electronically Signed: [name], ED Scribe. 02/28/2015. 6:56 PM.  Chief Complaint  Patient presents with  . Knee Pain   The history is provided by the patient. No language interpreter was used.     HPI Comments: [name] is a 44 y.o. female who presents to the Emergency Department complaining of sudden onset, constant, 9/10, left knee pain s/p falling up 3 stairs that occurred yesterday. Pt landed onto her knee, causing the pain. No head injury or LOC. No previous surgery to left knee.Denies weakness, numbness, tingling, or any other associated symptoms. Pt states left knee gave out in the past, causing her to fall. She was seen by the orthopedist with diagnosis of cartilage. Not currently on any anticoagulants. No hx bleeding disorders.    Past Medical History  Diagnosis Date  . Hypertension   . Migraine   . Chronic back pain    Past Surgical History  Procedure Laterality Date  . Abdominal hysterectomy    . Cholecystectomy    . Hand surgery     Family History  Problem Relation Age of Onset  . Cancer Other   . Seizures Other   . Stroke Other   . Diabetes Other    Social History  Substance Use Topics  . Smoking status: Current Every Day Smoker -- 1.00 packs/day for 20 years    Types: Cigarettes  . Smokeless tobacco: Never Used  . Alcohol Use: No   OB History    Gravida Para Term Preterm AB TAB SAB Ectopic Multiple Living   2 2 2       2      Review of Systems  Musculoskeletal: Positive for joint swelling and arthralgias (Right knee).  Skin: Negative for wound.  Neurological: Negative for weakness and numbness.  All other systems reviewed and are negative.     Allergies  Levaquin; Ibuprofen; Macrobid; Naproxen; and Penicillins  Home  Medications   Prior to Admission medications   Medication Sig Start Date End Date Taking? Authorizing Provider  acetaminophen (TYLENOL) 500 MG tablet Take 1,000 mg by mouth every 6 (six) hours as needed for mild pain, moderate pain or headache.     Historical Provider, MD   Triage Vitals: BP 174/81 mmHg  Pulse 108  Temp(Src) 98.2 F (36.8 C) (Oral)  Resp 16  Ht 5\' 1"  (1.549 m)  Wt 125 lb (56.7 kg)  BMI 23.63 kg/m2  SpO2 100%   Physical Exam  Constitutional: She is oriented to person, place, and time. She appears well-developed and well-nourished. No distress.  HENT:  Head: Normocephalic and atraumatic.  Eyes: Conjunctivae and EOM are normal.  Neck: Neck supple. No tracheal deviation present.  Cardiovascular: Normal rate.   Pulmonary/Chest: Effort normal. No respiratory distress.  Musculoskeletal: Normal range of motion. She exhibits tenderness.  DPP pulses are 2+  Posterior tibial pulses 2+ Achilles is intact No deformity of the left tibial area There are bruises of the lower portion of the left leg Patella is midline There is no deformity of the left quadricep Tenderness along the patella tendon but no deformity No posterior pass appreciated Mild crepitus present with ROM Good ROM of the left hip   Neurological: She is alert and oriented to person, place, and time.  Skin: Skin is warm and dry.  Psychiatric: She has a normal mood and affect. Her behavior is normal.  Nursing note and vitals reviewed.   ED Course  Procedures (including critical care time)  DIAGNOSTIC STUDIES: Oxygen Saturation is 100% on RA, normal by my interpretation.    COORDINATION OF CARE: 6:44 PM-Discussed treatment plan which includes knee immobilizer, rest, and referral to orthopedist with pt at bedside and pt agreed to plan.   Labs Review Labs Reviewed - No data to display  Imaging Review Dg Knee Complete 4 Views Left  02/28/2015  CLINICAL DATA:  Left knee pain after falling on steps.  Initial encounter. EXAM: LEFT KNEE - COMPLETE 4+ VIEW COMPARISON:  04/06/2014 FINDINGS: There is no evidence of fracture, dislocation, or joint effusion. There is no evidence of arthropathy or other focal bone abnormality. Soft tissues are unremarkable. IMPRESSION: Negative. Electronically Signed   By: Monte Fantasia M.D.   On: 02/28/2015 18:34   I have personally reviewed and evaluated these images as part of my medical decision-making.   EKG Interpretation None      MDM  xray is read as negative. Vital signs reviewed. Suspect the patient has contusions involving the knee and lower leg. Patient fitted with a knee immobilizer, and given an ice pack to use. The patient is referred to Dr. Aline Brochure for orthopedic evaluation if not improving.    Final diagnoses:  None    **I have reviewed nursing notes, vital signs, and all appropriate lab and imaging results for this patient.Lily Kocher, PA-C 02/28/15 1907  Forde Dandy, MD 03/01/15 [phone]

## 2015-02-28 NOTE — Discharge Instructions (Signed)
Your xrays are negative for fracture or dislocation. Please apply ice, and keep your leg elevated above your waist is much as possible. Please see Dr. Aline Brochure for orthopedic evaluation and management. Please use your knee immobilizer until seen by Dr. Aline Brochure. Contusion A contusion is a deep bruise. Contusions happen when an injury causes bleeding under the skin. Symptoms of bruising include pain, swelling, and discolored skin. The skin may turn blue, purple, or yellow. HOME CARE   Rest the injured area.  If told, put ice on the injured area.  Put ice in a plastic bag.  Place a towel between your skin and the bag.  Leave the ice on for 20 minutes, 2-3 times per day.  If told, put light pressure (compression) on the injured area using an elastic bandage. Make sure the bandage is not too tight. Remove it and put it back on as told by your doctor.  If possible, raise (elevate) the injured area above the level of your heart while you are sitting or lying down.  Take over-the-counter and prescription medicines only as told by your doctor. GET HELP IF:  Your symptoms do not get better after several days of treatment.  Your symptoms get worse.  You have trouble moving the injured area. GET HELP RIGHT AWAY IF:   You have very bad pain.  You have a loss of feeling (numbness) in a hand or foot.  Your hand or foot turns pale or cold.   This information is not intended to replace advice given to you by your health care provider. Make sure you discuss any questions you have with your health care provider.   Document Released: 10/17/2007 Document Revised: 01/19/2015 Document Reviewed: 09/15/2014 Elsevier Interactive Patient Education 2016 [employer] or

## 2015-02-28 NOTE — ED Notes (Signed)
PA at bedside.

## 2015-02-28 NOTE — ED Notes (Signed)
Pt c/o of LT knee pain after falling up the steps. Pt ambulatory. No deformity noted.

## 2017-01-15 ENCOUNTER — Ambulatory Visit: Payer: BLUE CROSS/BLUE SHIELD | Admitting: Student in an Organized Health Care Education/Training Program

## 2017-01-31 ENCOUNTER — Ambulatory Visit: Payer: BLUE CROSS/BLUE SHIELD | Admitting: Student in an Organized Health Care Education/Training Program

## 2017-02-04 ENCOUNTER — Ambulatory Visit
Payer: BLUE CROSS/BLUE SHIELD | Attending: Student in an Organized Health Care Education/Training Program | Admitting: Student in an Organized Health Care Education/Training Program

## 2017-02-04 ENCOUNTER — Telehealth: Payer: Self-pay | Admitting: *Deleted

## 2017-02-04 ENCOUNTER — Encounter: Payer: Self-pay | Admitting: Student in an Organized Health Care Education/Training Program

## 2017-02-04 VITALS — BP 171/75 | HR 66 | Temp 98.2°F | Resp 16 | Ht 61.0 in | Wt 120.0 lb

## 2017-02-04 DIAGNOSIS — Z9049 Acquired absence of other specified parts of digestive tract: Secondary | ICD-10-CM | POA: Insufficient documentation

## 2017-02-04 DIAGNOSIS — G894 Chronic pain syndrome: Secondary | ICD-10-CM | POA: Insufficient documentation

## 2017-02-04 DIAGNOSIS — M5442 Lumbago with sciatica, left side: Secondary | ICD-10-CM | POA: Insufficient documentation

## 2017-02-04 DIAGNOSIS — F17200 Nicotine dependence, unspecified, uncomplicated: Secondary | ICD-10-CM | POA: Diagnosis not present

## 2017-02-04 DIAGNOSIS — Z791 Long term (current) use of non-steroidal anti-inflammatories (NSAID): Secondary | ICD-10-CM | POA: Diagnosis not present

## 2017-02-04 DIAGNOSIS — Z820 Family history of epilepsy and other diseases of the nervous system: Secondary | ICD-10-CM | POA: Insufficient documentation

## 2017-02-04 DIAGNOSIS — Z809 Family history of malignant neoplasm, unspecified: Secondary | ICD-10-CM | POA: Insufficient documentation

## 2017-02-04 DIAGNOSIS — M5441 Lumbago with sciatica, right side: Secondary | ICD-10-CM

## 2017-02-04 DIAGNOSIS — M47816 Spondylosis without myelopathy or radiculopathy, lumbar region: Secondary | ICD-10-CM | POA: Diagnosis not present

## 2017-02-04 DIAGNOSIS — Z833 Family history of diabetes mellitus: Secondary | ICD-10-CM | POA: Diagnosis not present

## 2017-02-04 DIAGNOSIS — I10 Essential (primary) hypertension: Secondary | ICD-10-CM | POA: Diagnosis not present

## 2017-02-04 DIAGNOSIS — M5136 Other intervertebral disc degeneration, lumbar region: Secondary | ICD-10-CM | POA: Diagnosis not present

## 2017-02-04 DIAGNOSIS — G8929 Other chronic pain: Secondary | ICD-10-CM

## 2017-02-04 DIAGNOSIS — Z9889 Other specified postprocedural states: Secondary | ICD-10-CM | POA: Diagnosis not present

## 2017-02-04 DIAGNOSIS — Z886 Allergy status to analgesic agent status: Secondary | ICD-10-CM | POA: Diagnosis not present

## 2017-02-04 DIAGNOSIS — M5416 Radiculopathy, lumbar region: Secondary | ICD-10-CM | POA: Insufficient documentation

## 2017-02-04 DIAGNOSIS — Z90710 Acquired absence of both cervix and uterus: Secondary | ICD-10-CM | POA: Diagnosis not present

## 2017-02-04 DIAGNOSIS — Z881 Allergy status to other antibiotic agents status: Secondary | ICD-10-CM | POA: Diagnosis not present

## 2017-02-04 DIAGNOSIS — G43709 Chronic migraine without aura, not intractable, without status migrainosus: Secondary | ICD-10-CM | POA: Diagnosis not present

## 2017-02-04 DIAGNOSIS — Z888 Allergy status to other drugs, medicaments and biological substances status: Secondary | ICD-10-CM | POA: Insufficient documentation

## 2017-02-04 DIAGNOSIS — S335XXA Sprain of ligaments of lumbar spine, initial encounter: Secondary | ICD-10-CM | POA: Diagnosis not present

## 2017-02-04 DIAGNOSIS — Z880 Allergy status to penicillin: Secondary | ICD-10-CM | POA: Diagnosis not present

## 2017-02-04 DIAGNOSIS — Z823 Family history of stroke: Secondary | ICD-10-CM | POA: Insufficient documentation

## 2017-02-04 DIAGNOSIS — M51369 Other intervertebral disc degeneration, lumbar region without mention of lumbar back pain or lower extremity pain: Secondary | ICD-10-CM | POA: Insufficient documentation

## 2017-02-04 MED ORDER — TOPIRAMATE 25 MG PO TABS: 25.0000 mg | ORAL_TABLET | Freq: Two times a day (BID) | ORAL | 1 refills | Status: DC

## 2017-02-04 MED ORDER — DICLOFENAC SODIUM 1 % TD GEL: 4.0000 g | Freq: Four times a day (QID) | TRANSDERMAL | 1 refills | Status: DC

## 2017-02-04 NOTE — Progress Notes (Signed)
Patient's Name: [name]  MRN: [id number]  Referring Provider: Vidal Schwalbe, MD  DOB: Aug 05, 1970  PCP: Patient, No Pcp Per  DOS: 02/04/2017  Note by: Gillis Santa, MD  Service setting: Ambulatory outpatient  Specialty: Interventional Pain Management  Location: [facility] (AMB) Pain Management Facility  Visit type: Initial Patient Evaluation  Patient type: New Patient   Primary Reason(s) for Visit: Encounter for initial evaluation of one or more chronic problems (new to examiner) potentially causing chronic pain, and posing a threat to normal musculoskeletal function. (Level of risk: High) CC: Back Pain (low) and Leg Pain (left)  HPI  [name] is a 46 y.o. year old, female patient, who comes today to see [place] for the first time for an initial evaluation of her chronic pain. She has Lumbar radiculopathy; Lumbar degenerative disc disease; Lumbar spondylosis; Chronic left-sided low back pain with left-sided sciatica; Chronic pain syndrome; Lumbar sprain; and Chronic migraine without aura without status migrainosus, not intractable on her problem list. Today she comes in for evaluation of her Back Pain (low) and Leg Pain (left)  Pain Assessment: Location: Lower Back Radiating: left leg Onset: More than a month ago Duration: Chronic pain Quality: Pounding, Stabbing, Pressure, Constant Severity: 7 /10 (self-reported pain score)  Note: Reported level is compatible with observation.                   When using our objective Pain Scale, any level above a 5/10 is said to belong in an emergency room, as it progressively worsens from a 6/10, which is described as severely limiting. Requiring emergency care not usually available at an outpatient pain management facility. Communication becomes difficult and requires great effort. Assistance to reach the emergency department may be required. Facial flushing and profuse sweating along with potentially dangerous increases in heart rate and blood pressure will be  evident. Effect on ADL:   Timing: Constant Modifying factors: nothing but postioning while lying down  Onset and Duration: Gradual and Date of onset: 3 years ago Cause of pain: Unknown Severity: Getting worse, NAS-11 at its worse: 10/10, NAS-11 at its best: 4/10, NAS-11 now: 8/10 and NAS-11 on the average: 8/10 Timing: Morning, Night and During activity or exercise Aggravating Factors: Bending, Climbing, Kneeling, Lifiting, Motion, Prolonged sitting, Prolonged standing, Squatting, Stooping , Twisting, Walking, Walking uphill, Walking downhill and Working Alleviating Factors: Cold packs, Lying down, Medications and Warm showers or baths Associated Problems: Numbness, Spasms, Swelling, Tingling, Weakness, Pain that wakes patient up and Pain that does not allow patient to sleep Quality of Pain: Constant, Deep, Getting longer, Pressure-like, Sharp, Shooting, Stabbing and Uncomfortable Previous Examinations or Tests: MRI scan, X-rays and Orthopedic evaluation Previous Treatments: Biofeedback, Epidural steroid injections, Narcotic medications, Physical Therapy, Relaxation therapy, Steroid treatments by mouth, Strengthening exercises, TENS and Trigger point injections  The patient comes into the clinics today for the first time for a chronic pain management evaluation.   46 year old female with a past medical history significant for hypertension, migraines who presents with axial low back pain that radiates into her bilateral thighs and left leg. Patient states that the pain started 2-3 years ago. No inciting event or injury. Pain is localized axial low back but radiates to her left thigh and left leg and occasionally down to her left big toe. On the right side her pain is similar in quality but not as intense and radiates only to her posterior thigh and rarely radiates past her right knee.  No weakness, endorses numbness  and legs. No bowel or bladder dysfunction.  Patient is tried gabapentin which  made her too sleepy, Lyrica which was not effective. She states that Cymbalta worsened her mood and resulted in depression. Patient has not tried Topamax. Butrans patch caused her to have chest pain. She takes tramadol 100 mg twice a day to 3 times a day when necessary. She also takes temazepam 30 mg daily at bedtime when necessary as a sleep aid.  Patient has previously had a left L5-S1 transforaminal epidural steroid injection on October 2017 which worsened her pain.  Today I took the time to provide the patient with information regarding my pain practice. The patient was informed that my practice is divided into two sections: an interventional pain management section, as well as a completely separate and distinct medication management section. I explained that I have procedure days for my interventional therapies, and evaluation days for follow-ups and medication management. Because of the amount of documentation required during both, they are kept separated. This means that there is the possibility that she may be scheduled for a procedure on one day, and medication management the next. I have also informed her that because of staffing and facility limitations, I no longer take patients for medication management only. To illustrate the reasons for this, I gave the patient the example of surgeons, and how inappropriate it would be to refer a patient to his/her care, just to write for the post-surgical antibiotics on a surgery done by a different surgeon.   Because interventional pain management is my board-certified specialty, the patient was informed that joining my practice means that they are open to any and all interventional therapies. I made it clear that this does not mean that they will be forced to have any procedures done. What this means is that I believe interventional therapies to be essential part of the diagnosis and proper management of chronic pain conditions. Therefore, patients not  interested in these interventional alternatives will be better served under the care of a different practitioner.  The patient was also made aware of my Comprehensive Pain Management Safety Guidelines where by joining my practice, they limit all of their nerve blocks and joint injections to those done by our practice, for as long as we are retained to manage their care.   Historic Controlled Substance Pharmacotherapy Review  PMP and historical list of controlled substances:Tramadol 50 mg, quantity 180, last fill 29 Highest opioid analgesic regimen found: Percocet 5 mg, quantity 28, last fill 10/29/2016 Most recent opioid analgesic: Tramadol as above Current opioid analgesics: Tramadol as above MME/day: Approximately 30 mg/day Medications: The patient did not bring the medication(s) to the appointment, as requested in our "New Patient Package" Pharmacodynamics: Desired effects: Analgesia: The patient reports >50% benefit. Reported improvement in function: The patient reports medication allows her to accomplish basic ADLs. Clinically meaningful improvement in function (CMIF): Sustained CMIF goals met Perceived effectiveness: Described as relatively effective, allowing for increase in activities of daily living (ADL) Undesirable effects: Side-effects or Adverse reactions: None reported Historical Monitoring: The patient  reports that she does not use drugs. List of all UDS Test(s): No results found for: MDMA, COCAINSCRNUR, PCPSCRNUR, PCPQUANT, CANNABQUANT, THCU, [place] List of all Serum Drug Screening Test(s):  No results found for: AMPHSCRSER, BARBSCRSER, BENZOSCRSER, COCAINSCRSER, PCPSCRSER, PCPQUANT, THCSCRSER, CANNABQUANT, OPIATESCRSER, OXYSCRSER, PROPOXSCRSER Historical Background Evaluation: [place] PMP: Six (6) year initial data search conducted.             PMP NARX Score Report:  Narcotic: 451 Sedative: 410 Stimulant: 0 [place] Department of public safety, offender search: Editor, commissioning  Information) Non-contributory Risk Assessment Profile: Aberrant behavior: None observed or detected today Risk factors for fatal opioid overdose: None identified today PMP NARX Overdose Risk Score: 150 Fatal overdose hazard ratio (HR): Calculation deferred Non-fatal overdose hazard ratio (HR): Calculation deferred Risk of opioid abuse or dependence: 0.7-3.0% with doses ? 36 MME/day and 6.1-26% with doses ? 120 MME/day. Substance use disorder (SUD) risk level: Pending results of Medical Psychology Evaluation for SUD Opioid risk tool (ORT) (Total Score): 0     Opioid Risk Tool - 02/04/17 1253      Family History of Substance Abuse   Alcohol Negative   Illegal Drugs Negative   Rx Drugs Negative     Personal History of Substance Abuse   Alcohol Negative   Illegal Drugs Negative   Rx Drugs Negative     Age   Age between 39-45 years  No     History of Preadolescent Sexual Abuse   History of Preadolescent Sexual Abuse Negative or Female     Psychological Disease   Psychological Disease Negative   Depression Negative     Total Score   Opioid Risk Tool Scoring 0   Opioid Risk Interpretation Low Risk     ORT Scoring interpretation table:  Score <3 = Low Risk for SUD  Score between 4-7 = Moderate Risk for SUD  Score >8 = High Risk for Opioid Abuse     Pharmacologic Plan: Pending ordered tests and/or consults            Initial impression: Pending review of available data and ordered tests.  Meds   Current Outpatient Prescriptions:  .  acetaminophen (TYLENOL) 500 MG tablet, Take 1,000 mg by mouth every 6 (six) hours as needed for mild pain, moderate pain or headache. , Disp: , Rfl:  .  metoprolol succinate (TOPROL-XL) 100 MG 24 hr tablet, Take 100 mg by mouth daily. Take with or immediately following a meal., Disp: , Rfl:  .  temazepam (RESTORIL) 15 MG capsule, Take 30 mg by mouth at bedtime as needed for sleep., Disp: , Rfl:  .  traMADol (ULTRAM) 50 MG tablet, Take 100 mg by  mouth every 8 (eight) hours as needed., Disp: , Rfl:  .  diclofenac sodium (VOLTAREN) 1 % GEL, Apply 4 g topically 4 (four) times daily., Disp: 1 Tube, Rfl: 1 .  topiramate (TOPAMAX) 25 MG tablet, Take 1 tablet (25 mg total) by mouth 2 (two) times daily., Disp: 60 tablet, Rfl: 1  Imaging Review   Results for orders placed during the hospital encounter of 01/03/15  CT Cervical Spine Wo Contrast   Narrative CLINICAL DATA:  [place] on Friday and hit posterior head. Posterior neck pain. Headache since fall.  EXAM: CT HEAD WITHOUT CONTRAST  CT CERVICAL SPINE WITHOUT CONTRAST  TECHNIQUE: Multidetector CT imaging of the head and cervical spine was performed following the standard protocol without intravenous contrast. Multiplanar CT image reconstructions of the cervical spine were also generated.  COMPARISON:  07/19/2012  FINDINGS: CT HEAD FINDINGS  No evidence for acute hemorrhage, mass lesion, midline shift, hydrocephalus or large infarct. Mastoid air cells and visualized paranasal sinuses are clear. No acute bone abnormality.  CT CERVICAL SPINE FINDINGS  Incomplete fusion along the posterior arch of C1. Negative for an acute fracture or dislocation. Small pleural based nodule along the right lung apex measures up to 4 mm and could represent scarring but  indeterminate. Negative for pneumothorax. No gross abnormality to the thyroid tissue. No significant soft tissue swelling. Normal alignment in the cervical spine.  IMPRESSION: No acute intracranial abnormality.  No acute bone abnormality in the cervical spine.  Indeterminate 4 mm pleural-based nodule along the right lung apex. If the patient is at high risk for bronchogenic carcinoma, follow-up chest CT at 1 year is recommended. If the patient is at low risk, no follow-up is needed. This recommendation follows the consensus statement: Guidelines for Management of Small Pulmonary Nodules Detected on CT Scans: A Statement from  the [place] as published in Radiology 2005; 237:395-400.   Electronically Signed   By: Markus Daft M.D.   On: 01/03/2015 14:27     Thoracic DG w/swimmers view:  Results for orders placed during the hospital encounter of 06/22/13  [facility] Thoracic Spine W/Swimmers   Narrative CLINICAL DATA:  Back pain  EXAM: THORACIC SPINE - 2 VIEW + SWIMMERS  COMPARISON:  None.  FINDINGS: There is no evidence of thoracic spine fracture. Alignment is normal. No other significant bone abnormalities are identified. Prior cholecystectomy clips are identified in the right abdomen.  IMPRESSION: No acute fracture or dislocation.   Electronically Signed   By: Abelardo Diesel M.D.   On: 06/22/2013 21:49     Lumbar DG (Complete) 4+V:  Results for orders placed during the hospital encounter of 07/08/11  DG Lumbar Spine Complete   Narrative   Clinical Data: [place] and injured low back.  LUMBAR SPINE - COMPLETE 4+ VIEW 07/08/2011:  Comparison: None.  Findings: Five non-rib bearing lumbar vertebra with anatomic alignment.  No visible fractures.  Well-preserved disc spaces.  No pars defects.  No significant facet arthropathy.  No evidence of spondylosis.  Visualized sacroiliac joints intact.  IMPRESSION: Normal examination.  Original Report Authenticated By: Deniece Portela, M.D.    Knee-L DG 4 views:  Results for orders placed during the hospital encounter of 02/28/15  DG Knee Complete 4 Views Left   Narrative CLINICAL DATA:  Left knee pain after falling on steps. Initial encounter.  EXAM: LEFT KNEE - COMPLETE 4+ VIEW  COMPARISON:  04/06/2014  FINDINGS: There is no evidence of fracture, dislocation, or joint effusion. There is no evidence of arthropathy or other focal bone abnormality. Soft tissues are unremarkable.  IMPRESSION: Negative.   Electronically Signed   By: Monte Fantasia M.D.   On: 02/28/2015 18:34    Lumbar MRI:'s 11/01/2015  L4-L5: A central  focal annular bulge is appreciated resulting in mild effacement of the anterior CSF space. There is mild thecal sac stenosis. There is no evidence of neural foraminal narrowing. L5-S1: No significant facet arthropathy, ligamentum flavum thickening or spinal canal narrowing.  Impression: Mild central annular protrusion at L4-L5 resulting in mild thecal sac narrowing.  Complexity Note: Imaging results reviewed. Results discussed using Layman's terms.               ROS  Cardiovascular History: Daily Aspirin intake, High blood pressure, Chest pain and Heart murmur Pulmonary or Respiratory History: Smoking, Snoring  and Coughing up mucus (Bronchitis) Neurological History: No reported neurological signs or symptoms such as seizures, abnormal skin sensations, urinary and/or fecal incontinence, being born with an abnormal open spine and/or a tethered spinal cord Review of Past Neurological Studies:  Results for orders placed or performed during the hospital encounter of 01/03/15  CT Head Wo Contrast   Narrative   CLINICAL DATA:  [place] on Friday and hit posterior head.  Posterior neck pain. Headache since fall.  EXAM: CT HEAD WITHOUT CONTRAST  CT CERVICAL SPINE WITHOUT CONTRAST  TECHNIQUE: Multidetector CT imaging of the head and cervical spine was performed following the standard protocol without intravenous contrast. Multiplanar CT image reconstructions of the cervical spine were also generated.  COMPARISON:  07/19/2012  FINDINGS: CT HEAD FINDINGS  No evidence for acute hemorrhage, mass lesion, midline shift, hydrocephalus or large infarct. Mastoid air cells and visualized paranasal sinuses are clear. No acute bone abnormality.  CT CERVICAL SPINE FINDINGS  Incomplete fusion along the posterior arch of C1. Negative for an acute fracture or dislocation. Small pleural based nodule along the right lung apex measures up to 4 mm and could represent scarring but indeterminate. Negative for  pneumothorax. No gross abnormality to the thyroid tissue. No significant soft tissue swelling. Normal alignment in the cervical spine.  IMPRESSION: No acute intracranial abnormality.  No acute bone abnormality in the cervical spine.  Indeterminate 4 mm pleural-based nodule along the right lung apex. If the patient is at high risk for bronchogenic carcinoma, follow-up chest CT at 1 year is recommended. If the patient is at low risk, no follow-up is needed. This recommendation follows the consensus statement: Guidelines for Management of Small Pulmonary Nodules Detected on CT Scans: A Statement from the [place] as published in Radiology 2005; 237:395-400.   Electronically Signed   By: Markus Daft M.D.   On: 01/03/2015 14:27    Psychological-Psychiatric History: No reported psychological or psychiatric signs or symptoms such as difficulty sleeping, anxiety, depression, delusions or hallucinations (schizophrenial), mood swings (bipolar disorders) or suicidal ideations or attempts Gastrointestinal History: Reflux or heatburn, Alternating episodes iof diarrhea and constipation (IBS-Irritable bowe syndrome) and Inflamed pancreas (Pancreatitis) Genitourinary History: No reported renal or genitourinary signs or symptoms such as difficulty voiding or producing urine, peeing blood, non-functioning kidney, kidney stones, difficulty emptying the bladder, difficulty controlling the flow of urine, or chronic kidney disease Hematological History: Weakness due to low blood hemoglobin or red blood cell count (Anemia) and Brusing easily Endocrine History: No reported endocrine signs or symptoms such as high or low blood sugar, rapid heart rate due to high thyroid levels, obesity or weight gain due to slow thyroid or thyroid disease Rheumatologic History: Joint aches and or swelling due to excess weight (Osteoarthritis) Musculoskeletal History: Negative for myasthenia gravis, muscular dystrophy,  multiple sclerosis or malignant hyperthermia Work History: Out of work due to pain  Allergies  Ms. Pennebaker is allergic to buprenorphine; cefaclor; gabapentin; levaquin [levofloxacin in d5w]; ibuprofen; levofloxacin; macrobid [nitrofurantoin monohyd macro]; naproxen; nitrofurantoin; and penicillins.  Laboratory Chemistry  Inflammation Markers (CRP: Acute Phase) (ESR: Chronic Phase) No results found for: CRP, ESRSEDRATE               Renal Function Markers Lab Results  Component Value Date   BUN 9 03/24/2013   CREATININE 0.97 03/24/2013   GFRAA 82 (L) 03/24/2013   GFRNONAA 71 (L) 03/24/2013                 Hepatic Function Markers Lab Results  Component Value Date   AST 17 03/24/2013   ALT 8 03/24/2013   ALBUMIN 3.6 03/24/2013   ALKPHOS 90 03/24/2013                 Electrolytes Lab Results  Component Value Date   NA 138 03/24/2013   K 4.3 03/24/2013   CL 105 03/24/2013   CALCIUM 9.6 03/24/2013  Neuropathy Markers No results found for: [id number]               Bone Pathology Markers Lab Results  Component Value Date   ALKPHOS 90 03/24/2013   CALCIUM 9.6 03/24/2013                 Coagulation Parameters Lab Results  Component Value Date   PLT 307 03/24/2013                 Cardiovascular Markers Lab Results  Component Value Date   HGB 13.4 03/24/2013   HCT 39.7 03/24/2013                 Note: Lab results reviewed.  [place]  Drug: Ms. Gallant  reports that she does not use drugs. Alcohol:  reports that she does not drink alcohol. Tobacco:  reports that she has been smoking Cigarettes.  She has a 20.00 pack-year smoking history. She has never used smokeless tobacco. Medical:  has a past medical history of Chronic back pain; Hypertension; and Migraine. Family: family history includes Cancer in her other; Diabetes in her other; Seizures in her other; Stroke in her other.  Past Surgical History:  Procedure Laterality Date  . ABDOMINAL  HYSTERECTOMY    . CHOLECYSTECTOMY    . HAND SURGERY     Active Ambulatory Problems    Diagnosis Date Noted  . Lumbar radiculopathy 02/04/2017  . Lumbar degenerative disc disease 02/04/2017  . Lumbar spondylosis 02/04/2017  . Chronic left-sided low back pain with left-sided sciatica 02/04/2017  . Chronic pain syndrome 02/04/2017  . Lumbar sprain 02/04/2017  . Chronic migraine without aura without status migrainosus, not intractable 02/04/2017   Resolved Ambulatory Problems    Diagnosis Date Noted  . No Resolved Ambulatory Problems   Past Medical History:  Diagnosis Date  . Chronic back pain   . Hypertension   . Migraine    Constitutional Exam  General appearance: Well nourished, well developed, and well hydrated. In no apparent acute distress Vitals:   02/04/17 1247  BP: (!) 171/75  Pulse: 66  Resp: 16  Temp: 98.2 F (36.8 C)  TempSrc: Oral  SpO2: 100%  Weight: 120 lb (54.4 kg)  Height: 5' 1" (1.549 m)   BMI Assessment: Estimated body mass index is 22.67 kg/m as calculated from the following:   Height as of this encounter: 5' 1" (1.549 m).   Weight as of this encounter: 120 lb (54.4 kg).  BMI interpretation table: BMI level Category Range association with higher incidence of chronic pain  <18 kg/m2 Underweight   18.5-24.9 kg/m2 Ideal body weight   25-29.9 kg/m2 Overweight Increased incidence by 20%  30-34.9 kg/m2 Obese (Class I) Increased incidence by 68%  35-39.9 kg/m2 Severe obesity (Class II) Increased incidence by 136%  >40 kg/m2 Extreme obesity (Class III) Increased incidence by 254%   BMI Readings from Last 4 Encounters:  02/04/17 22.67 kg/m  02/28/15 23.62 kg/m  01/03/15 23.62 kg/m  11/30/14 23.62 kg/m   Wt Readings from Last 4 Encounters:  02/04/17 120 lb (54.4 kg)  02/28/15 125 lb (56.7 kg)  01/03/15 125 lb (56.7 kg)  11/30/14 125 lb (56.7 kg)  Psych/Mental status: Alert, oriented x 3 (person, place, & time)       Eyes: PERLA Respiratory:  No evidence of acute respiratory distress  Cervical Spine Area Exam  Skin & Axial Inspection: No masses, redness, edema, swelling, or associated skin lesions Alignment: Symmetrical Functional ROM: Unrestricted  ROM      Stability: No instability detected Muscle Tone/Strength: Functionally intact. No obvious neuro-muscular anomalies detected. Sensory (Neurological): Unimpaired Palpation: No palpable anomalies              Upper Extremity (UE) Exam    Side: Right upper extremity  Side: Left upper extremity  Skin & Extremity Inspection: Skin color, temperature, and hair growth are WNL. No peripheral edema or cyanosis. No masses, redness, swelling, asymmetry, or associated skin lesions. No contractures.  Skin & Extremity Inspection: Skin color, temperature, and hair growth are WNL. No peripheral edema or cyanosis. No masses, redness, swelling, asymmetry, or associated skin lesions. No contractures.  Functional ROM: Unrestricted ROM          Functional ROM: Unrestricted ROM          Muscle Tone/Strength: Functionally intact. No obvious neuro-muscular anomalies detected.  Muscle Tone/Strength: Functionally intact. No obvious neuro-muscular anomalies detected.  Sensory (Neurological): Unimpaired          Sensory (Neurological): Unimpaired          Palpation: No palpable anomalies              Palpation: No palpable anomalies              Specialized Test(s): Deferred         Specialized Test(s): Deferred          Thoracic Spine Area Exam  Skin & Axial Inspection: No masses, redness, or swelling Alignment: Symmetrical Functional ROM: Unrestricted ROM Stability: No instability detected Muscle Tone/Strength: Functionally intact. No obvious neuro-muscular anomalies detected. Sensory (Neurological): Unimpaired Muscle strength & Tone: No palpable anomalies  Lumbar Spine Area Exam  Skin & Axial Inspection: No masses, redness, or swelling Alignment: Symmetrical Functional ROM: Unrestricted ROM       Stability: No instability detected Muscle Tone/Strength: Functionally intact. No obvious neuro-muscular anomalies detected. Sensory (Neurological): Unimpaired Palpation: Complains of area being tender to palpation       Provocative Tests: Lumbar Hyperextension and rotation test: Positive bilaterally for facet joint pain. Left greater than right Lumbar Lateral bending test: Positive ipsilateral radicular pain, bilaterally. Positive for bilateral foraminal stenosis. Left greater than right Patrick's Maneuver: evaluation deferred today                    Gait & Posture Assessment  Ambulation: Limited Gait: Antalgic Posture: WNL   Lower Extremity Exam    Side: Right lower extremity  Side: Left lower extremity  Skin & Extremity Inspection: Skin color, temperature, and hair growth are WNL. No peripheral edema or cyanosis. No masses, redness, swelling, asymmetry, or associated skin lesions. No contractures.  Skin & Extremity Inspection: Skin color, temperature, and hair growth are WNL. No peripheral edema or cyanosis. No masses, redness, swelling, asymmetry, or associated skin lesions. No contractures.  Functional ROM: Unrestricted ROM          Functional ROM: Unrestricted ROM          Muscle Tone/Strength: Functionally intact. No obvious neuro-muscular anomalies detected.  Muscle Tone/Strength: L5 weakness, S1 weakness 4-/ 5 plantar flexion, dorsiflexion, knee flexion, knee extension primarily limited by pain   Sensory (Neurological): Unimpaired  Sensory (Neurological): Dermatomal pain pattern  Palpation: No palpable anomalies  Palpation: No palpable anomalies   Assessment  Primary Diagnosis & Pertinent Problem List: The primary encounter diagnosis was Lumbar radiculopathy. Diagnoses of Lumbar degenerative disc disease, Lumbar spondylosis, Chronic left-sided low back pain with left-sided sciatica, Chronic pain syndrome, Lumbar  sprain, initial encounter, and Chronic migraine without aura without  status migrainosus, not intractable were also pertinent to this visit.  Visit Diagnosis (New problems to examiner): 1. Lumbar radiculopathy   2. Lumbar degenerative disc disease   3. Lumbar spondylosis   4. Chronic left-sided low back pain with left-sided sciatica   5. Chronic pain syndrome   6. Lumbar sprain, initial encounter   7. Chronic migraine without aura without status migrainosus, not intractable    Plan of Care (Initial workup plan)  Note: Please be advised that as per protocol, today's visit has been an evaluation only. We have not taken over the patient's controlled substance management.  46 year old female with a past medical history of hypertension and migraine who presents with axial low back pain with radiation to bilateral legs (left greater than right) secondary to L4-L5 radiculopathy as evidenced on lumbar MRI. On physical exam patient does have positive straight leg raise test, left greater than right and has trouble with heel-to-toe walking on the left.  For pain patient currently takes tramadol 100 mg 3 times a day when necessary. She also takes temazepam 30 mg daily at bedtime as a sleep aid. Patient is tried gabapentin and Lyrica in the past which were not effective and resulted in side effects. She has also had a left L5-S1 transforaminal epidural steroid injection which was not effective.  I did discussion about repeating her epidural steroid injection utilizing an interlaminar approach directed towards the left. In regards to medication management, we discussed Topamax therapy which could be beneficial for her radicular symptoms and neuropathic pain and also for her migraines. Patient does not have a history of glaucoma or kidney stones.   Plan: -Topamax 25 mg twice a day -Prescription for Voltaren gel that the patient can apply to her low back. Okay to apply heat to low back when Voltaren gel not on. -L5/S1 epidural steroid injection with sedation under  fluoroscopy -Urine drug screen today which should be positive for hydrocodone, oxycodone, tramadol (these medications have been prescribed to her by her PCP) -Referral to pain psychology for risk of SUD  Ordered Lab-work, Procedure(s), Referral(s), & Consult(s): Orders Placed This Encounter  Procedures  . Lumbar Epidural Injection  . Compliance Drug Analysis, Ur  . Ambulatory referral to Psychology   Pharmacotherapy (current): Medications ordered:  Meds ordered this encounter  Medications  . diclofenac sodium (VOLTAREN) 1 % GEL    Sig: Apply 4 g topically 4 (four) times daily.    Dispense:  1 Tube    Refill:  1    Do not add this medication to the electronic "Automatic Refill" notification system. Patient may have prescription filled one day early if pharmacy is closed on scheduled refill date.  . topiramate (TOPAMAX) 25 MG tablet    Sig: Take 1 tablet (25 mg total) by mouth 2 (two) times daily.    Dispense:  60 tablet    Refill:  1   Medications administered during this visit: Ms. Bonk had no medications administered during this visit.   Pharmacological management options:  Opioid Analgesics: The patient was informed that there is no guarantee that she would be a candidate for opioid analgesics. The decision will be made following CDC guidelines. This decision will be based on the results of diagnostic studies, as well as Ms. Petrizzo's risk profile. Consider tramadol, Nucynta, Norco 5 mg twice a day when necessary   Membrane stabilizer: Gabapentin: Resulted in sedation, Lyrica: Mood changes and worsening depression. We'll trial  Topamax. No history of glaucoma or kidney stones.  Muscle relaxant: To be determined at a later time  NSAID: Medically contraindicated allergy to NSAIDs result in rash and pruritus   Other analgesic(s): To be determined at a later time   Interventional management options: Ms. Vigeant was informed that there is no guarantee that she would be a candidate for  interventional therapies. The decision will be based on the results of diagnostic studies, as well as Ms. Brosseau's risk profile.  Procedure(s) under consideration:  -Lumbar epidural steroid injection -Lumbar medial branch nerve blocks -Botox injections for migraines next non-sphenopalatine ganglion nerve blocks for migraines    Provider-requested follow-up: Return for Procedure.  No future appointments.  Primary Care Physician: Patient, No Pcp Per Location: [facility] Note by: Gillis Santa, M.D, Date: 02/04/2017; Time: 2:35 PM  Patient Instructions   1. Prescription for Topamax 25 mg twice a day. 2. Prescription for Voltaren gel that she can apply to your low back region after application of heat or ice 3. Urine drug screen today. 4. Referral to pain psychology which is standard for all new patients. 6. Schedule you for lumbar epidural steroid injection next weekPain Management Discharge Instructions  General Discharge Instructions :  If you need to reach your doctor call: Monday-Friday 8:00 am - 4:00 pm at [phone] or toll free [phone].  After clinic hours [phone] to have operator reach doctor.  Bring all of your medication bottles to all your appointments in the pain clinic.  To cancel or reschedule your appointment with Pain Management please remember to call 24 hours in advance to avoid a fee.  Refer to the educational materials which you have been given on: General Risks, I had my Procedure. Discharge Instructions, Post Sedation.  Post Procedure Instructions:  The drugs you were given will stay in your system until tomorrow, so for the next 24 hours you should not drive, make any legal decisions or drink any alcoholic beverages.  You may eat anything you prefer, but it is better to start with liquids then soups and crackers, and gradually work up to solid foods.  Please notify your doctor immediately if you have any unusual  bleeding, trouble breathing or pain that is not related to your normal pain.  Depending on the type of procedure that was done, some parts of your body may feel week and/or numb.  This usually clears up by tonight or the next day.  Walk with the use of an assistive device or accompanied by an adult for the 24 hours.  You may use ice on the affected area for the first 24 hours.  Put ice in a Ziploc bag and cover with a towel and place against area 15 minutes on 15 minutes off.  You may switch to heat after 24 hours.GENERAL RISKS AND COMPLICATIONS  What are the risk, side effects and possible complications? Generally speaking, most procedures are safe.  However, with any procedure there are risks, side effects, and the possibility of complications.  The risks and complications are dependent upon the sites that are lesioned, or the type of nerve block to be performed.  The closer the procedure is to the spine, the more serious the risks are.  Great care is taken when placing the radio frequency needles, block needles or lesioning probes, but sometimes complications can occur. 1. Infection: Any time there is an injection through the skin, there is a risk of infection.  This is why sterile conditions are  used for these blocks.  There are four possible types of infection. 1. Localized skin infection. 2. Central Nervous System Infection-This can be in the form of Meningitis, which can be deadly. 3. Epidural Infections-This can be in the form of an epidural abscess, which can cause pressure inside of the spine, causing compression of the spinal cord with subsequent paralysis. This would require an emergency surgery to decompress, and there are no guarantees that the patient would recover from the paralysis. 4. Discitis-This is an infection of the intervertebral discs.  It occurs in about 1% of discography procedures.  It is difficult to treat and it may lead to surgery.        2. Pain: the needles have to go  through skin and soft tissues, will cause soreness.       3. Damage to internal structures:  The nerves to be lesioned may be near blood vessels or    other nerves which can be potentially damaged.       4. Bleeding: Bleeding is more common if the patient is taking blood thinners such as  aspirin, Coumadin, Ticiid, Plavix, etc., or if he/she have some genetic predisposition  such as hemophilia. Bleeding into the spinal canal can cause compression of the spinal  cord with subsequent paralysis.  This would require an emergency surgery to  decompress and there are no guarantees that the patient would recover from the  paralysis.       5. Pneumothorax:  Puncturing of a lung is a possibility, every time a needle is introduced in  the area of the chest or upper back.  Pneumothorax refers to free air around the  collapsed lung(s), inside of the thoracic cavity (chest cavity).  Another two possible  complications related to a similar event would include: Hemothorax and Chylothorax.   These are variations of the Pneumothorax, where instead of air around the collapsed  lung(s), you may have blood or chyle, respectively.       6. Spinal headaches: They may occur with any procedures in the area of the spine.       7. Persistent CSF (Cerebro-Spinal Fluid) leakage: This is a rare problem, but may occur  with prolonged intrathecal or epidural catheters either due to the formation of a fistulous  track or a dural tear.       8. Nerve damage: By working so close to the spinal cord, there is always a possibility of  nerve damage, which could be as serious as a permanent spinal cord injury with  paralysis.       9. Death:  Although rare, severe deadly allergic reactions known as "Anaphylactic  reaction" can occur to any of the medications used.      10. Worsening of the symptoms:  We can always make thing worse.  What are the chances of something like this happening? Chances of any of this occuring are extremely low.  By  statistics, you have more of a chance of getting killed in a motor vehicle accident: while driving to the hospital than any of the above occurring .  Nevertheless, you should be aware that they are possibilities.  In general, it is similar to taking a shower.  Everybody knows that you can slip, hit your head and get killed.  Does that mean that you should not shower again?  Nevertheless always keep in mind that statistics do not mean anything if you happen to be on the wrong side of them.  Even  if a procedure has a 1 (one) in a 1,000,000 (million) chance of going wrong, it you happen to be that one..Also, keep in mind that by statistics, you have more of a chance of having something go wrong when taking medications.  Who should not have this procedure? If you are on a blood thinning medication (e.g. Coumadin, Plavix, see list of "Blood Thinners"), or if you have an active infection going on, you should not have the procedure.  If you are taking any blood thinners, please inform your physician.  How should I prepare for this procedure?  Do not eat or drink anything at least six hours prior to the procedure.  Bring a driver with you .  It cannot be a taxi.  Come accompanied by an adult that can drive you back, and that is strong enough to help you if your legs get weak or numb from the local anesthetic.  Take all of your medicines the morning of the procedure with just enough water to swallow them.  If you have diabetes, make sure that you are scheduled to have your procedure done first thing in the morning, whenever possible.  If you have diabetes, take only half of your insulin dose and notify our nurse that you have done so as soon as you arrive at the clinic.  If you are diabetic, but only take blood sugar pills (oral hypoglycemic), then do not take them on the morning of your procedure.  You may take them after you have had the procedure.  Do not take aspirin or any aspirin-containing  medications, at least eleven (11) days prior to the procedure.  They may prolong bleeding.  Wear loose fitting clothing that may be easy to take off and that you would not mind if it got stained with Betadine or blood.  Do not wear any jewelry or perfume  Remove any nail coloring.  It will interfere with some of our monitoring equipment.  NOTE: Remember that this is not meant to be interpreted as a complete list of all possible complications.  Unforeseen problems may occur.  BLOOD THINNERS The following drugs contain aspirin or other products, which can cause increased bleeding during surgery and should not be taken for 2 weeks prior to and 1 week after surgery.  If you should need take something for relief of minor pain, you may take acetaminophen which is found in Tylenol,m Datril, Anacin-3 and Panadol. It is not blood thinner. The products listed below are.  Do not take any of the products listed below in addition to any listed on your instruction sheet.  A.P.C or A.P.C with Codeine Codeine Phosphate Capsules #3 Ibuprofen Ridaura  ABC compound Congesprin Imuran rimadil  Advil Cope Indocin Robaxisal  Alka-Seltzer Effervescent Pain Reliever and Antacid Coricidin or Coricidin-D  Indomethacin Rufen  Alka-Seltzer plus Cold Medicine Cosprin Ketoprofen S-A-C Tablets  Anacin Analgesic Tablets or Capsules Coumadin Korlgesic Salflex  Anacin Extra Strength Analgesic tablets or capsules CP-2 Tablets Lanoril Salicylate  Anaprox Cuprimine Capsules Levenox Salocol  Anexsia-D Dalteparin Magan Salsalate  Anodynos Darvon compound Magnesium Salicylate Sine-off  Ansaid Dasin Capsules Magsal Sodium Salicylate  Anturane Depen Capsules Marnal Soma  APF Arthritis pain formula Dewitt's Pills Measurin Stanback  Argesic Dia-Gesic Meclofenamic Sulfinpyrazone  Arthritis Bayer Timed Release Aspirin Diclofenac Meclomen Sulindac  Arthritis pain formula Anacin Dicumarol Medipren Supac  Analgesic (Safety coated)  Arthralgen Diffunasal Mefanamic Suprofen  Arthritis Strength Bufferin Dihydrocodeine Mepro Compound Suprol  Arthropan liquid Dopirydamole Methcarbomol with Aspirin Synalgos  ASA tablets/Enseals Disalcid Micrainin Tagament  Ascriptin Doan's Midol Talwin  Ascriptin A/D Dolene Mobidin Tanderil  Ascriptin Extra Strength Dolobid Moblgesic Ticlid  Ascriptin with Codeine Doloprin or Doloprin with Codeine Momentum Tolectin  Asperbuf Duoprin Mono-gesic Trendar  Aspergum Duradyne Motrin or Motrin IB Triminicin  Aspirin plain, buffered or enteric coated Durasal Myochrisine Trigesic  Aspirin Suppositories Easprin Nalfon Trillsate  Aspirin with Codeine Ecotrin Regular or Extra Strength Naprosyn Uracel  Atromid-S Efficin Naproxen Ursinus  Auranofin Capsules Elmiron Neocylate Vanquish  Axotal Emagrin Norgesic Verin  Azathioprine Empirin or Empirin with Codeine Normiflo Vitamin E  Azolid Emprazil Nuprin Voltaren  Bayer Aspirin plain, buffered or children's or timed BC Tablets or powders Encaprin Orgaran Warfarin Sodium  Buff-a-Comp Enoxaparin Orudis Zorpin  Buff-a-Comp with Codeine Equegesic Os-Cal-Gesic   Buffaprin Excedrin plain, buffered or Extra Strength Oxalid   Bufferin Arthritis Strength Feldene Oxphenbutazone   Bufferin plain or Extra Strength Feldene Capsules Oxycodone with Aspirin   Bufferin with Codeine Fenoprofen Fenoprofen Pabalate or Pabalate-SF   Buffets II Flogesic Panagesic   Buffinol plain or Extra Strength Florinal or Florinal with Codeine Panwarfarin   Buf-Tabs Flurbiprofen Penicillamine   Butalbital Compound Four-way cold tablets Penicillin   Butazolidin Fragmin Pepto-Bismol   Carbenicillin Geminisyn Percodan   Carna Arthritis Reliever Geopen Persantine   Carprofen Gold's salt Persistin   Chloramphenicol Goody's Phenylbutazone   Chloromycetin Haltrain Piroxlcam   Clmetidine heparin Plaquenil   Cllnoril Hyco-pap Ponstel   Clofibrate Hydroxy chloroquine Propoxyphen          Before stopping any of these medications, be sure to consult the physician who ordered them.  Some, such as Coumadin (Warfarin) are ordered to prevent or treat serious conditions such as "deep thrombosis", "pumonary embolisms", and other heart problems.  The amount of time that you may need off of the medication may also vary with the medication and the reason for which you were taking it.  If you are taking any of these medications, please make sure you notify your pain physician before you undergo any procedures.         Epidural Steroid Injection Patient Information  Description: The epidural space surrounds the nerves as they exit the spinal cord.  In some patients, the nerves can be compressed and inflamed by a bulging disc or a tight spinal canal (spinal stenosis).  By injecting steroids into the epidural space, we can bring irritated nerves into direct contact with a potentially helpful medication.  These steroids act directly on the irritated nerves and can reduce swelling and inflammation which often leads to decreased pain.  Epidural steroids may be injected anywhere along the spine and from the neck to the low back depending upon the location of your pain.   After numbing the skin with local anesthetic (like Novocaine), a small needle is passed into the epidural space slowly.  You may experience a sensation of pressure while this is being done.  The entire block usually last less than 10 minutes.  Conditions which may be treated by epidural steroids:   Low back and leg pain  Neck and arm pain  Spinal stenosis  Post-laminectomy syndrome  Herpes zoster (shingles) pain  Pain from compression fractures  Preparation for the injection:  1. Do not eat any solid food or dairy products within 8 hours of your appointment.  2. You may drink clear liquids up to 3 hours before appointment.  Clear liquids include water, black coffee, juice or soda.  No milk or cream please. 3.  You  may take your regular medication, including pain medications, with a sip of water before your appointment  Diabetics should hold regular insulin (if taken separately) and take 1/2 normal NPH dos the morning of the procedure.  Carry some sugar containing items with you to your appointment. 4. A driver must accompany you and be prepared to drive you home after your procedure.  5. Bring all your current medications with your. 6. An IV may be inserted and sedation may be given at the discretion of the physician.   7. A blood pressure cuff, EKG and other monitors will often be applied during the procedure.  Some patients may need to have extra oxygen administered for a short period. 8. You will be asked to provide medical information, including your allergies, prior to the procedure.  We must know immediately if you are taking blood thinners (like Coumadin/Warfarin)  Or if you are allergic to IV iodine contrast (dye). We must know if you could possible be pregnant.  Possible side-effects:  Bleeding from needle site  Infection (rare, may require surgery)  Nerve injury (rare)  Numbness & tingling (temporary)  Difficulty urinating (rare, temporary)  Spinal headache ( a headache worse with upright posture)  Light -headedness (temporary)  Pain at injection site (several days)  Decreased blood pressure (temporary)  Weakness in arm/leg (temporary)  Pressure sensation in back/neck (temporary)  Call if you experience:  Fever/chills associated with headache or increased back/neck pain.  Headache worsened by an upright position.  New onset weakness or numbness of an extremity below the injection site  Hives or difficulty breathing (go to the emergency room)  Inflammation or drainage at the infection site  Severe back/neck pain  Any new symptoms which are concerning to you  Please note:  Although the local anesthetic injected can often make your back or neck feel good for several  hours after the injection, the pain will likely return.  It takes 3-7 days for steroids to work in the epidural space.  You may not notice any pain relief for at least that one week.  If effective, we will often do a series of three injections spaced 3-6 weeks apart to maximally decrease your pain.  After the initial series, we generally will wait several months before considering a repeat injection of the same type.  If you have any questions, please call [phone] [facility]

## 2017-02-04 NOTE — Progress Notes (Signed)
Safety precautions to be maintained throughout the outpatient stay will include: orient to surroundings, keep bed in low position, maintain call bell within reach at all times, provide assistance with transfer out of bed and ambulation.  

## 2017-02-04 NOTE — Patient Instructions (Addendum)
1. Prescription for Topamax 25 mg twice a day. 2. Prescription for Voltaren gel that she can apply to your low back region after application of heat or ice 3. Urine drug screen today. 4. Referral to pain psychology which is standard for all new patients. 6. Schedule you for lumbar epidural steroid injection next weekPain Management Discharge Instructions  General Discharge Instructions :  If you need to reach your doctor call: Monday-Friday 8:00 am - 4:00 pm at [phone] or toll free [phone].  After clinic hours [phone] to have operator reach doctor.  Bring all of your medication bottles to all your appointments in the pain clinic.  To cancel or reschedule your appointment with Pain Management please remember to call 24 hours in advance to avoid a fee.  Refer to the educational materials which you have been given on: General Risks, I had my Procedure. Discharge Instructions, Post Sedation.  Post Procedure Instructions:  The drugs you were given will stay in your system until tomorrow, so for the next 24 hours you should not drive, make any legal decisions or drink any alcoholic beverages.  You may eat anything you prefer, but it is better to start with liquids then soups and crackers, and gradually work up to solid foods.  Please notify your doctor immediately if you have any unusual bleeding, trouble breathing or pain that is not related to your normal pain.  Depending on the type of procedure that was done, some parts of your body may feel week and/or numb.  This usually clears up by tonight or the next day.  Walk with the use of an assistive device or accompanied by an adult for the 24 hours.  You may use ice on the affected area for the first 24 hours.  Put ice in a Ziploc bag and cover with a towel and place against area 15 minutes on 15 minutes off.  You may switch to heat after 24 hours.GENERAL RISKS AND COMPLICATIONS  What are the risk, side effects and possible  complications? Generally speaking, most procedures are safe.  However, with any procedure there are risks, side effects, and the possibility of complications.  The risks and complications are dependent upon the sites that are lesioned, or the type of nerve block to be performed.  The closer the procedure is to the spine, the more serious the risks are.  Great care is taken when placing the radio frequency needles, block needles or lesioning probes, but sometimes complications can occur. 1. Infection: Any time there is an injection through the skin, there is a risk of infection.  This is why sterile conditions are used for these blocks.  There are four possible types of infection. 1. Localized skin infection. 2. Central Nervous System Infection-This can be in the form of Meningitis, which can be deadly. 3. Epidural Infections-This can be in the form of an epidural abscess, which can cause pressure inside of the spine, causing compression of the spinal cord with subsequent paralysis. This would require an emergency surgery to decompress, and there are no guarantees that the patient would recover from the paralysis. 4. Discitis-This is an infection of the intervertebral discs.  It occurs in about 1% of discography procedures.  It is difficult to treat and it may lead to surgery.        2. Pain: the needles have to go through skin and soft tissues, will cause soreness.       3. Damage to internal structures:  The nerves to  be lesioned may be near blood vessels or    other nerves which can be potentially damaged.       4. Bleeding: Bleeding is more common if the patient is taking blood thinners such as  aspirin, Coumadin, Ticiid, Plavix, etc., or if he/she have some genetic predisposition  such as hemophilia. Bleeding into the spinal canal can cause compression of the spinal  cord with subsequent paralysis.  This would require an emergency surgery to  decompress and there are no guarantees that the patient  would recover from the  paralysis.       5. Pneumothorax:  Puncturing of a lung is a possibility, every time a needle is introduced in  the area of the chest or upper back.  Pneumothorax refers to free air around the  collapsed lung(s), inside of the thoracic cavity (chest cavity).  Another two possible  complications related to a similar event would include: Hemothorax and Chylothorax.   These are variations of the Pneumothorax, where instead of air around the collapsed  lung(s), you may have blood or chyle, respectively.       6. Spinal headaches: They may occur with any procedures in the area of the spine.       7. Persistent CSF (Cerebro-Spinal Fluid) leakage: This is a rare problem, but may occur  with prolonged intrathecal or epidural catheters either due to the formation of a fistulous  track or a dural tear.       8. Nerve damage: By working so close to the spinal cord, there is always a possibility of  nerve damage, which could be as serious as a permanent spinal cord injury with  paralysis.       9. Death:  Although rare, severe deadly allergic reactions known as "Anaphylactic  reaction" can occur to any of the medications used.      10. Worsening of the symptoms:  We can always make thing worse.  What are the chances of something like this happening? Chances of any of this occuring are extremely low.  By statistics, you have more of a chance of getting killed in a motor vehicle accident: while driving to the hospital than any of the above occurring .  Nevertheless, you should be aware that they are possibilities.  In general, it is similar to taking a shower.  Everybody knows that you can slip, hit your head and get killed.  Does that mean that you should not shower again?  Nevertheless always keep in mind that statistics do not mean anything if you happen to be on the wrong side of them.  Even if a procedure has a 1 (one) in a 1,000,000 (million) chance of going wrong, it you happen to be that  one..Also, keep in mind that by statistics, you have more of a chance of having something go wrong when taking medications.  Who should not have this procedure? If you are on a blood thinning medication (e.g. Coumadin, Plavix, see list of "Blood Thinners"), or if you have an active infection going on, you should not have the procedure.  If you are taking any blood thinners, please inform your physician.  How should I prepare for this procedure?  Do not eat or drink anything at least six hours prior to the procedure.  Bring a driver with you .  It cannot be a taxi.  Come accompanied by an adult that can drive you back, and that is strong enough to help you if your  legs get weak or numb from the local anesthetic.  Take all of your medicines the morning of the procedure with just enough water to swallow them.  If you have diabetes, make sure that you are scheduled to have your procedure done first thing in the morning, whenever possible.  If you have diabetes, take only half of your insulin dose and notify our nurse that you have done so as soon as you arrive at the clinic.  If you are diabetic, but only take blood sugar pills (oral hypoglycemic), then do not take them on the morning of your procedure.  You may take them after you have had the procedure.  Do not take aspirin or any aspirin-containing medications, at least eleven (11) days prior to the procedure.  They may prolong bleeding.  Wear loose fitting clothing that may be easy to take off and that you would not mind if it got stained with Betadine or blood.  Do not wear any jewelry or perfume  Remove any nail coloring.  It will interfere with some of our monitoring equipment.  NOTE: Remember that this is not meant to be interpreted as a complete list of all possible complications.  Unforeseen problems may occur.  BLOOD THINNERS The following drugs contain aspirin or other products, which can cause increased bleeding during  surgery and should not be taken for 2 weeks prior to and 1 week after surgery.  If you should need take something for relief of minor pain, you may take acetaminophen which is found in Tylenol,m Datril, Anacin-3 and Panadol. It is not blood thinner. The products listed below are.  Do not take any of the products listed below in addition to any listed on your instruction sheet.  A.P.C or A.P.C with Codeine Codeine Phosphate Capsules #3 Ibuprofen Ridaura  ABC compound Congesprin Imuran rimadil  Advil Cope Indocin Robaxisal  Alka-Seltzer Effervescent Pain Reliever and Antacid Coricidin or Coricidin-D  Indomethacin Rufen  Alka-Seltzer plus Cold Medicine Cosprin Ketoprofen S-A-C Tablets  Anacin Analgesic Tablets or Capsules Coumadin Korlgesic Salflex  Anacin Extra Strength Analgesic tablets or capsules CP-2 Tablets Lanoril Salicylate  Anaprox Cuprimine Capsules Levenox Salocol  Anexsia-D Dalteparin Magan Salsalate  Anodynos Darvon compound Magnesium Salicylate Sine-off  Ansaid Dasin Capsules Magsal Sodium Salicylate  Anturane Depen Capsules Marnal Soma  APF Arthritis pain formula Dewitt's Pills Measurin Stanback  Argesic Dia-Gesic Meclofenamic Sulfinpyrazone  Arthritis Bayer Timed Release Aspirin Diclofenac Meclomen Sulindac  Arthritis pain formula Anacin Dicumarol Medipren Supac  Analgesic (Safety coated) Arthralgen Diffunasal Mefanamic Suprofen  Arthritis Strength Bufferin Dihydrocodeine Mepro Compound Suprol  Arthropan liquid Dopirydamole Methcarbomol with Aspirin Synalgos  ASA tablets/Enseals Disalcid Micrainin Tagament  Ascriptin Doan's Midol Talwin  Ascriptin A/D Dolene Mobidin Tanderil  Ascriptin Extra Strength Dolobid Moblgesic Ticlid  Ascriptin with Codeine Doloprin or Doloprin with Codeine Momentum Tolectin  Asperbuf Duoprin Mono-gesic Trendar  Aspergum Duradyne Motrin or Motrin IB Triminicin  Aspirin plain, buffered or enteric coated Durasal Myochrisine Trigesic  Aspirin  Suppositories Easprin Nalfon Trillsate  Aspirin with Codeine Ecotrin Regular or Extra Strength Naprosyn Uracel  Atromid-S Efficin Naproxen Ursinus  Auranofin Capsules Elmiron Neocylate Vanquish  Axotal Emagrin Norgesic Verin  Azathioprine Empirin or Empirin with Codeine Normiflo Vitamin E  Azolid Emprazil Nuprin Voltaren  Bayer Aspirin plain, buffered or children's or timed BC Tablets or powders Encaprin Orgaran Warfarin Sodium  Buff-a-Comp Enoxaparin Orudis Zorpin  Buff-a-Comp with Codeine Equegesic Os-Cal-Gesic   Buffaprin Excedrin plain, buffered or Extra Strength Oxalid   Bufferin Arthritis Strength  Feldene Oxphenbutazone   Bufferin plain or Extra Strength Feldene Capsules Oxycodone with Aspirin   Bufferin with Codeine Fenoprofen Fenoprofen Pabalate or Pabalate-SF   Buffets II Flogesic Panagesic   Buffinol plain or Extra Strength Florinal or Florinal with Codeine Panwarfarin   Buf-Tabs Flurbiprofen Penicillamine   Butalbital Compound Four-way cold tablets Penicillin   Butazolidin Fragmin Pepto-Bismol   Carbenicillin Geminisyn Percodan   Carna Arthritis Reliever Geopen Persantine   Carprofen Gold's salt Persistin   Chloramphenicol Goody's Phenylbutazone   Chloromycetin Haltrain Piroxlcam   Clmetidine heparin Plaquenil   Cllnoril Hyco-pap Ponstel   Clofibrate Hydroxy chloroquine Propoxyphen         Before stopping any of these medications, be sure to consult the physician who ordered them.  Some, such as Coumadin (Warfarin) are ordered to prevent or treat serious conditions such as "deep thrombosis", "pumonary embolisms", and other heart problems.  The amount of time that you may need off of the medication may also vary with the medication and the reason for which you were taking it.  If you are taking any of these medications, please make sure you notify your pain physician before you undergo any procedures.         Epidural Steroid Injection Patient  Information  Description: The epidural space surrounds the nerves as they exit the spinal cord.  In some patients, the nerves can be compressed and inflamed by a bulging disc or a tight spinal canal (spinal stenosis).  By injecting steroids into the epidural space, we can bring irritated nerves into direct contact with a potentially helpful medication.  These steroids act directly on the irritated nerves and can reduce swelling and inflammation which often leads to decreased pain.  Epidural steroids may be injected anywhere along the spine and from the neck to the low back depending upon the location of your pain.   After numbing the skin with local anesthetic (like Novocaine), a small needle is passed into the epidural space slowly.  You may experience a sensation of pressure while this is being done.  The entire block usually last less than 10 minutes.  Conditions which may be treated by epidural steroids:   Low back and leg pain  Neck and arm pain  Spinal stenosis  Post-laminectomy syndrome  Herpes zoster (shingles) pain  Pain from compression fractures  Preparation for the injection:  1. Do not eat any solid food or dairy products within 8 hours of your appointment.  2. You may drink clear liquids up to 3 hours before appointment.  Clear liquids include water, black coffee, juice or soda.  No milk or cream please. 3. You may take your regular medication, including pain medications, with a sip of water before your appointment  Diabetics should hold regular insulin (if taken separately) and take 1/2 normal NPH dos the morning of the procedure.  Carry some sugar containing items with you to your appointment. 4. A driver must accompany you and be prepared to drive you home after your procedure.  5. Bring all your current medications with your. 6. An IV may be inserted and sedation may be given at the discretion of the physician.   7. A blood pressure cuff, EKG and other monitors will  often be applied during the procedure.  Some patients may need to have extra oxygen administered for a short period. 8. You will be asked to provide medical information, including your allergies, prior to the procedure.  We must know immediately if you are taking  blood thinners (like Coumadin/Warfarin)  Or if you are allergic to IV iodine contrast (dye). We must know if you could possible be pregnant.  Possible side-effects:  Bleeding from needle site  Infection (rare, may require surgery)  Nerve injury (rare)  Numbness & tingling (temporary)  Difficulty urinating (rare, temporary)  Spinal headache ( a headache worse with upright posture)  Light -headedness (temporary)  Pain at injection site (several days)  Decreased blood pressure (temporary)  Weakness in arm/leg (temporary)  Pressure sensation in back/neck (temporary)  Call if you experience:  Fever/chills associated with headache or increased back/neck pain.  Headache worsened by an upright position.  New onset weakness or numbness of an extremity below the injection site  Hives or difficulty breathing (go to the emergency room)  Inflammation or drainage at the infection site  Severe back/neck pain  Any new symptoms which are concerning to you  Please note:  Although the local anesthetic injected can often make your back or neck feel good for several hours after the injection, the pain will likely return.  It takes 3-7 days for steroids to work in the epidural space.  You may not notice any pain relief for at least that one week.  If effective, we will often do a series of three injections spaced 3-6 weeks apart to maximally decrease your pain.  After the initial series, we generally will wait several months before considering a repeat injection of the same type.  If you have any questions, please call [phone] [facility]

## 2017-02-05 ENCOUNTER — Telehealth: Payer: Self-pay | Admitting: *Deleted

## 2017-02-05 NOTE — Addendum Note (Signed)
Addended by: Gillis Santa on: 02/05/2017 03:31 PM   Modules accepted: Orders

## 2017-02-06 ENCOUNTER — Telehealth: Payer: Self-pay | Admitting: *Deleted

## 2017-02-08 LAB — COMPLIANCE DRUG ANALYSIS, UR

## 2017-02-26 ENCOUNTER — Ambulatory Visit: Payer: Self-pay | Admitting: Licensed Clinical Social Worker

## 2017-03-11 ENCOUNTER — Ambulatory Visit: Payer: Self-pay | Admitting: Licensed Clinical Social Worker

## 2017-03-19 ENCOUNTER — Encounter: Payer: Self-pay | Admitting: Psychiatry

## 2017-03-19 ENCOUNTER — Ambulatory Visit: Payer: BLUE CROSS/BLUE SHIELD | Admitting: Psychiatry

## 2017-03-19 ENCOUNTER — Other Ambulatory Visit: Payer: Self-pay

## 2017-03-19 VITALS — BP 162/87 | HR 74 | Temp 98.0°F | Wt 122.8 lb

## 2017-03-19 DIAGNOSIS — F1190 Opioid use, unspecified, uncomplicated: Secondary | ICD-10-CM

## 2017-03-19 DIAGNOSIS — F17200 Nicotine dependence, unspecified, uncomplicated: Secondary | ICD-10-CM | POA: Diagnosis not present

## 2017-03-19 DIAGNOSIS — G4701 Insomnia due to medical condition: Secondary | ICD-10-CM

## 2017-03-19 DIAGNOSIS — F1910 Other psychoactive substance abuse, uncomplicated: Secondary | ICD-10-CM

## 2017-03-19 DIAGNOSIS — G894 Chronic pain syndrome: Secondary | ICD-10-CM | POA: Diagnosis not present

## 2017-03-19 HISTORY — DX: Other psychoactive substance abuse, uncomplicated: F19.10

## 2017-03-19 NOTE — Patient Instructions (Signed)
  Crisis Hotline [employer] Numbers  [place] [phone]  [place] or 1-800-SUICIDE  Crisis Text Line - Text HOME to [phone]  Crisis chat - [facility] Chat   [phone]

## 2017-03-19 NOTE — Progress Notes (Signed)
Psychiatric Initial Adult Assessment   Patient Identification: [name] MRN:  [id number] Date of Evaluation:  03/19/2017 Referral Source: Monica Santa MD for assessment of possible mental health/substance abuse [name] potential.  Chief Complaint:  " I am here for mandated evaluation prior to pain management" Chief Complaint    Establish Care; pain clinic     Visit Diagnosis:    ICD-10-CM   1. Chronic, continuous use of opioids F11.90   2. Chronic pain syndrome G89.4   3. Tobacco use disorder F17.200   4. Insomnia due to medical condition G47.01     History of Present Illness: [name] is a 46 year old Caucasian female, unemployed, lives in [place] , has a history of chronic pain syndrome as well as insomnia, opioid use episodic, presented to the clinic today, for a psychiatric evaluation.  Patient today reported that she was referred to the psychiatric clinic here by Dr. Holley Raring, pain management.  She reported that she has been struggling with pain since the past several years.  She reports that she struggles with chronic back pain, and it has been worsening since the past 2 years.  She reports that she was told that she has DDD, arthritis .  She reports that she is currently unemployed since her pain has worsened.  She used to work as a Quarry manager. She reports that her pain is currently managed by her primary medical doctor, Dr. Bartolo Darter.  She reports that she is currently on tramadol 50 mg,2 tab, q. 8 hourly.  Reports that it helps to some extent.  She denies misusing her tramadol or using more than what is prescribed.  She denies abusing any opioid medications.  She reports insomnia, most likely due to her pain.  She reports that she tried several over-the-counter medications for sleep in the past.  She also tried melatonin.  She reports that currently she is on temazepam 30 mg and it has been helping.  Her PMD is managing her sleep medication.  She denies any depressive symptoms like sadness,  loss of appetite, low energy, suicidal ideation, crying spells and so on.  She denies any anxiety symptoms like extreme worry, physical sensation of anxiety like racing heart rate ,chest pain, shortness of breath and so on.  She denies any panic attacks.  She denies any social anxiety.  Denies any manic or hypomanic symptoms.  She denies any perceptual disturbances.  Denies any history of trauma.  She denies any OCD symptoms.  She denies any substance abuse problems.  She reports that she uses alcohol socially.  She  drinks wine occasionally.  She denies abusing any other illicit drugs.  She denies abusing any prescription medications.  She has a diagnosis of high blood pressure, migraine.  She reports her PMD manages her medical issues and she is compliant with her medications.    Associated Signs/Symptoms: Depression Symptoms:  denies (Hypo) Manic Symptoms:  denies Anxiety Symptoms:  denies Psychotic Symptoms:  denies PTSD Symptoms: Negative  Past Psychiatric History:Denies past hx of mental illness, or suicide attempts. Denies past hx of IP admission for mental health issues.   Previous Psychotropic Medications: Yes , sleep aids prescribed by her PMD - melatonin , Restoril  Substance Abuse History in the last 12 months:  No.  Consequences of Substance Abuse: Negative  Past Medical History:  Past Medical History:  Diagnosis Date  . Chronic back pain   . Hypertension   . Migraine     Past Surgical History:  Procedure Laterality  Date  . ABDOMINAL HYSTERECTOMY    . CHOLECYSTECTOMY    . HAND SURGERY      Family Psychiatric History: Denies hx of mental illness in family. Denies hx of suicide in family. Denies hx of substance abuse in family.  Family History:  Family History  Problem Relation Age of Onset  . Cancer Other   . Seizures Other   . Stroke Other   . Diabetes Other     Social History:   Social History   Socioeconomic History  . Marital status:  Married    Spouse name: benito  . Number of children: 2  . Years of education: None  . Highest education level: Associate degree: occupational, Hotel manager, or vocational program  Social Needs  . Financial resource strain: Not very hard  . Food insecurity - worry: Never true  . Food insecurity - inability: Never true  . Transportation needs - medical: No  . Transportation needs - non-medical: No  Occupational History    Comment: not employed  Tobacco Use  . Smoking status: Current Every Day Smoker    Packs/day: 1.00    Years: 20.00    Pack years: 20.00    Types: Cigarettes  . Smokeless tobacco: Never Used  Substance and Sexual Activity  . Alcohol use: No  . Drug use: No  . Sexual activity: Yes    Birth control/protection: Surgical  Other Topics Concern  . None  Social History Narrative  . None    Additional Social History: She graduated HS and did 2 yrs of college. She is married . She used to work as a Quarry manager. She currently is unemployed since her client passed away last 10/09/22. 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 Her husband is supportive and supports her financially too.   Allergies:   Allergies  Allergen Reactions  . Buprenorphine Other (See Comments)    Chest pain/ringing in hears and feet swelling  . Cefaclor   . Gabapentin Nausea Only    Vomiting  And dizzy  . Levaquin [Levofloxacin In D5w] Itching  . Ibuprofen Swelling and Rash  . Levofloxacin Rash    Itching  . Macrobid [Nitrofurantoin Monohyd Macro] Rash  . Naproxen Swelling and Rash  . Nitrofurantoin Rash  . Penicillins Swelling and Rash    Metabolic Disorder Labs: No results found for: HGBA1C, MPG No results found for: PROLACTIN No results found for: CHOL, TRIG, HDL, CHOLHDL, VLDL, LDLCALC   Current Medications: Current Outpatient Medications  Medication Sig Dispense Refill  .  acetaminophen (TYLENOL) 500 MG tablet Take 1,000 mg by mouth every 6 (six) hours as needed for mild pain, moderate pain or headache.     . metoprolol succinate (TOPROL-XL) 100 MG 24 hr tablet Take 100 mg by mouth daily. Take with or immediately following a meal.    . RaNITidine HCl (ZANTAC PO) Take by mouth.    . temazepam (RESTORIL) 30 MG capsule Take by mouth.    . traMADol (ULTRAM) 50 MG tablet Take 100 mg by mouth every 8 (eight) hours as needed.     No current facility-administered medications for this visit.     Neurologic: Headache: No Seizure: No Paresthesias:No  Musculoskeletal: Strength & Muscle Tone: within normal limits Gait & Station: normal Patient leans: N/A  Psychiatric Specialty Exam: Review of Systems  Musculoskeletal: Positive for back  pain.  Psychiatric/Behavioral: Negative for depression, hallucinations, memory loss, substance abuse and suicidal ideas. The patient is not nervous/anxious and does not have insomnia.   All other systems reviewed and are negative.   Blood pressure (!) 162/87, pulse 74, temperature 98 F (36.7 C), temperature source Oral, weight 122 lb 12.8 oz (55.7 kg).Body mass index is 23.2 kg/m.  General Appearance: Casual  Eye Contact:  Fair  Speech:  Normal Rate  Volume:  Normal  Mood:  Euthymic  Affect:  Appropriate  Thought Process:  Goal Directed and Descriptions of Associations: Intact  Orientation:  Full (Time, Place, and Person)  Thought Content:  Logical  Suicidal Thoughts:  No  Homicidal Thoughts:  No  Memory:  Immediate;   Fair Recent;   Fair Remote;   Fair  Judgement:  Fair  Insight:  Fair  Psychomotor Activity:  Normal  Concentration:  Concentration: Fair and Attention Span: Fair  Recall:  [employer] of Knowledge:Fair  Language: Fair  Akathisia:  No  Handed:  Right  AIMS (if indicated):  NA  Assets:  Communication Skills Desire for [place] Talents/Skills Transportation Vocational/Educational  ADL's:  Intact  Cognition: WNL  Sleep:  Stable on medications    Treatment Plan Summary: Kang is a 46 year old Caucasian female who has a history of chronic pain syndrome, and chronic use of opioids presented to the clinic today for a psychiatric evaluation.  She was referred by her pain management provider.  She currently denies any mood symptoms, psychosis, substance abuse problems.  She denies any suicidality or homicidality.  She does struggle with insomnia due to her pain issues.  However she reports that her current medications are helping her and she wants to continue the same.  She is currently unemployed.  However she has good social support from her husband.  She denies any current stressors other than her pain.  She denies any positive family history for mental health problems, substance abuse problems or suicide.  She currently is psychiatrically cleared.  Discussed with patient to follow-up with clinic in future if she notices any mood symptoms, worsening of sleep or feels she is in a crisis.  The following screening tools were used for this psychiatric evaluation today.  - PHQ 9 = 3 - (She denies depressive sx)  -GAD-7 = 0 - ( Denies anxiety sx) -CAGE = 0 ( Denies alcohol abuse) -ORT = 0 ( Low risk) DAST = 0  (Low risk)  SOAP = 5 ( Low risk )   I have also reviewed [place] controlled substance database.    Medication management and Plan see below    -She will continue her Restoril 30 mg po qhs for insomnia. She will continue to follow up with her PMD for the same. -Discussed her elevated BP today - she will follow up with PMD regarding the same. - Discussed to follow up in clinic as needed if her condition changes or if she is in a crisis.   Based on clinical interview and instruments used at the time of evaluation , the risk is determined to be LOW for possible mental health issues /substance abuse /risk  .  This judgment regarding the risk of addiction/aberrant medication use carries the caution that it is probabilistic statement constructed by the inherent limitations of both the clinical data and the scientific researches upon which it is based;  and therefore  cannot be definitive.    This note was generated in part  or whole with voice recognition software. Voice recognition is usually quite accurate but there are transcription errors that can and very often do occur. I apologize for any typographical errors that were not detected and corrected.          Ursula Alert, MD 11/6/20182:04 PM

## 2017-04-15 ENCOUNTER — Encounter: Payer: Self-pay | Admitting: Student in an Organized Health Care Education/Training Program

## 2017-04-15 ENCOUNTER — Ambulatory Visit (HOSPITAL_BASED_OUTPATIENT_CLINIC_OR_DEPARTMENT_OTHER): Payer: BLUE CROSS/BLUE SHIELD | Admitting: Student in an Organized Health Care Education/Training Program

## 2017-04-15 ENCOUNTER — Other Ambulatory Visit: Payer: Self-pay

## 2017-04-15 ENCOUNTER — Ambulatory Visit
Admission: RE | Admit: 2017-04-15 | Discharge: 2017-04-15 | Disposition: A | Discharge status: Home / Self Care | Payer: BLUE CROSS/BLUE SHIELD | Source: Ambulatory Visit | Attending: Student in an Organized Health Care Education/Training Program | Admitting: Student in an Organized Health Care Education/Training Program

## 2017-04-15 VITALS — BP 159/77 | HR 69 | Temp 97.1°F | Resp 18 | Ht 61.0 in | Wt 120.0 lb

## 2017-04-15 DIAGNOSIS — Z886 Allergy status to analgesic agent status: Secondary | ICD-10-CM | POA: Diagnosis not present

## 2017-04-15 DIAGNOSIS — Z90710 Acquired absence of both cervix and uterus: Secondary | ICD-10-CM | POA: Insufficient documentation

## 2017-04-15 DIAGNOSIS — Z880 Allergy status to penicillin: Secondary | ICD-10-CM | POA: Insufficient documentation

## 2017-04-15 DIAGNOSIS — Z79899 Other long term (current) drug therapy: Secondary | ICD-10-CM | POA: Insufficient documentation

## 2017-04-15 DIAGNOSIS — G894 Chronic pain syndrome: Secondary | ICD-10-CM

## 2017-04-15 DIAGNOSIS — M5136 Other intervertebral disc degeneration, lumbar region: Secondary | ICD-10-CM

## 2017-04-15 DIAGNOSIS — Z888 Allergy status to other drugs, medicaments and biological substances status: Secondary | ICD-10-CM | POA: Diagnosis not present

## 2017-04-15 DIAGNOSIS — Z79891 Long term (current) use of opiate analgesic: Secondary | ICD-10-CM | POA: Diagnosis not present

## 2017-04-15 DIAGNOSIS — Z881 Allergy status to other antibiotic agents status: Secondary | ICD-10-CM | POA: Insufficient documentation

## 2017-04-15 DIAGNOSIS — M5416 Radiculopathy, lumbar region: Secondary | ICD-10-CM | POA: Diagnosis not present

## 2017-04-15 DIAGNOSIS — Z9049 Acquired absence of other specified parts of digestive tract: Secondary | ICD-10-CM | POA: Diagnosis not present

## 2017-04-15 DIAGNOSIS — G8929 Other chronic pain: Secondary | ICD-10-CM

## 2017-04-15 DIAGNOSIS — M5116 Intervertebral disc disorders with radiculopathy, lumbar region: Secondary | ICD-10-CM | POA: Insufficient documentation

## 2017-04-15 DIAGNOSIS — M5442 Lumbago with sciatica, left side: Secondary | ICD-10-CM

## 2017-04-15 DIAGNOSIS — M545 Low back pain: Secondary | ICD-10-CM | POA: Diagnosis present

## 2017-04-15 MED ORDER — ROPIVACAINE HCL 2 MG/ML IJ SOLN
2.0000 mL | Freq: Once | INTRAMUSCULAR | Status: AC
  Administered 2017-04-15: 10 mL via EPIDURAL

## 2017-04-15 MED ORDER — ROPIVACAINE HCL 2 MG/ML IJ SOLN
INTRAMUSCULAR | Status: AC
  Filled 2017-04-15: qty 10

## 2017-04-15 MED ORDER — DEXAMETHASONE SODIUM PHOSPHATE 10 MG/ML IJ SOLN
10.0000 mg | Freq: Once | INTRAMUSCULAR | Status: AC
  Administered 2017-04-15: 10 mg

## 2017-04-15 MED ORDER — SODIUM CHLORIDE 0.9% FLUSH
2.0000 mL | Freq: Once | INTRAVENOUS | Status: AC
  Administered 2017-04-15: 10 mL

## 2017-04-15 MED ORDER — FENTANYL CITRATE (PF) 100 MCG/2ML IJ SOLN
INTRAMUSCULAR | Status: AC
  Filled 2017-04-15: qty 2

## 2017-04-15 MED ORDER — FENTANYL CITRATE (PF) 100 MCG/2ML IJ SOLN
25.0000 ug | INTRAMUSCULAR | Status: DC | PRN
  Administered 2017-04-15: 50 ug via INTRAVENOUS

## 2017-04-15 MED ORDER — LIDOCAINE HCL (PF) 1 % IJ SOLN
INTRAMUSCULAR | Status: AC
  Filled 2017-04-15: qty 5

## 2017-04-15 MED ORDER — LACTATED RINGERS IV SOLN
1000.0000 mL | Freq: Once | INTRAVENOUS | Status: AC
  Administered 2017-04-15: 1000 mL via INTRAVENOUS

## 2017-04-15 MED ORDER — SODIUM CHLORIDE 0.9 % IJ SOLN
INTRAMUSCULAR | Status: AC
  Filled 2017-04-15: qty 10

## 2017-04-15 MED ORDER — IOPAMIDOL (ISOVUE-M 200) INJECTION 41%
10.0000 mL | Freq: Once | INTRAMUSCULAR | Status: AC
  Administered 2017-04-15: 10 mL via EPIDURAL
  Filled 2017-04-15: qty 10

## 2017-04-15 MED ORDER — DEXAMETHASONE SODIUM PHOSPHATE 10 MG/ML IJ SOLN
INTRAMUSCULAR | Status: AC
  Filled 2017-04-15: qty 1

## 2017-04-15 MED ORDER — OXYCODONE-ACETAMINOPHEN 5-325 MG PO TABS: 1.0000 | ORAL_TABLET | Freq: Four times a day (QID) | ORAL | 0 refills | Status: DC | PRN

## 2017-04-15 MED ORDER — LIDOCAINE HCL (PF) 1 % IJ SOLN
4.5000 mL | Freq: Once | INTRAMUSCULAR | Status: AC
  Administered 2017-04-15: 5 mL

## 2017-04-15 NOTE — Patient Instructions (Signed)
GENERAL RISKS AND COMPLICATIONS  What are the risk, side effects and possible complications? Generally speaking, most procedures are safe.  However, with any procedure there are risks, side effects, and the possibility of complications.  The risks and complications are dependent upon the sites that are lesioned, or the type of nerve block to be performed.  The closer the procedure is to the spine, the more serious the risks are.  Great care is taken when placing the radio frequency needles, block needles or lesioning probes, but sometimes complications can occur. 1. Infection: Any time there is an injection through the skin, there is a risk of infection.  This is why sterile conditions are used for these blocks.  There are four possible types of infection. 1. Localized skin infection. 2. Central Nervous System Infection-This can be in the form of Meningitis, which can be deadly. 3. Epidural Infections-This can be in the form of an epidural abscess, which can cause pressure inside of the spine, causing compression of the spinal cord with subsequent paralysis. This would require an emergency surgery to decompress, and there are no guarantees that the patient would recover from the paralysis. 4. Discitis-This is an infection of the intervertebral discs.  It occurs in about 1% of discography procedures.  It is difficult to treat and it may lead to surgery.        2. Pain: the needles have to go through skin and soft tissues, will cause soreness.       3. Damage to internal structures:  The nerves to be lesioned may be near blood vessels or    other nerves which can be potentially damaged.       4. Bleeding: Bleeding is more common if the patient is taking blood thinners such as  aspirin, Coumadin, Ticiid, Plavix, etc., or if he/she have some genetic predisposition  such as hemophilia. Bleeding into the spinal canal can cause compression of the spinal  cord with subsequent paralysis.  This would require an  emergency surgery to  decompress and there are no guarantees that the patient would recover from the  paralysis.       5. Pneumothorax:  Puncturing of a lung is a possibility, every time a needle is introduced in  the area of the chest or upper back.  Pneumothorax refers to free air around the  collapsed lung(s), inside of the thoracic cavity (chest cavity).  Another two possible  complications related to a similar event would include: Hemothorax and Chylothorax.   These are variations of the Pneumothorax, where instead of air around the collapsed  lung(s), you may have blood or chyle, respectively.       6. Spinal headaches: They may occur with any procedures in the area of the spine.       7. Persistent CSF (Cerebro-Spinal Fluid) leakage: This is a rare problem, but may occur  with prolonged intrathecal or epidural catheters either due to the formation of a fistulous  track or a dural tear.       8. Nerve damage: By working so close to the spinal cord, there is always a possibility of  nerve damage, which could be as serious as a permanent spinal cord injury with  paralysis.       9. Death:  Although rare, severe deadly allergic reactions known as "Anaphylactic  reaction" can occur to any of the medications used.      10. Worsening of the symptoms:  We can always make thing worse.    What are the chances of something like this happening? Chances of any of this occuring are extremely low.  By statistics, you have more of a chance of getting killed in a motor vehicle accident: while driving to the hospital than any of the above occurring .  Nevertheless, you should be aware that they are possibilities.  In general, it is similar to taking a shower.  Everybody knows that you can slip, hit your head and get killed.  Does that mean that you should not shower again?  Nevertheless always keep in mind that statistics do not mean anything if you happen to be on the wrong side of them.  Even if a procedure has a 1  (one) in a 1,000,000 (million) chance of going wrong, it you happen to be that one..Also, keep in mind that by statistics, you have more of a chance of having something go wrong when taking medications.  Who should not have this procedure? If you are on a blood thinning medication (e.g. Coumadin, Plavix, see list of "Blood Thinners"), or if you have an active infection going on, you should not have the procedure.  If you are taking any blood thinners, please inform your physician.  How should I prepare for this procedure?  Do not eat or drink anything at least six hours prior to the procedure.  Bring a driver with you .  It cannot be a taxi.  Come accompanied by an adult that can drive you back, and that is strong enough to help you if your legs get weak or numb from the local anesthetic.  Take all of your medicines the morning of the procedure with just enough water to swallow them.  If you have diabetes, make sure that you are scheduled to have your procedure done first thing in the morning, whenever possible.  If you have diabetes, take only half of your insulin dose and notify our nurse that you have done so as soon as you arrive at the clinic.  If you are diabetic, but only take blood sugar pills (oral hypoglycemic), then do not take them on the morning of your procedure.  You may take them after you have had the procedure.  Do not take aspirin or any aspirin-containing medications, at least eleven (11) days prior to the procedure.  They may prolong bleeding.  Wear loose fitting clothing that may be easy to take off and that you would not mind if it got stained with Betadine or blood.  Do not wear any jewelry or perfume  Remove any nail coloring.  It will interfere with some of our monitoring equipment.  NOTE: Remember that this is not meant to be interpreted as a complete list of all possible complications.  Unforeseen problems may occur.  BLOOD THINNERS The following drugs  contain aspirin or other products, which can cause increased bleeding during surgery and should not be taken for 2 weeks prior to and 1 week after surgery.  If you should need take something for relief of minor pain, you may take acetaminophen which is found in Tylenol,m Datril, Anacin-3 and Panadol. It is not blood thinner. The products listed below are.  Do not take any of the products listed below in addition to any listed on your instruction sheet.  A.P.C or A.P.C with Codeine Codeine Phosphate Capsules #3 Ibuprofen Ridaura  ABC compound Congesprin Imuran rimadil  Advil Cope Indocin Robaxisal  Alka-Seltzer Effervescent Pain Reliever and Antacid Coricidin or Coricidin-D  Indomethacin Rufen    Alka-Seltzer plus Cold Medicine Cosprin Ketoprofen S-A-C Tablets  Anacin Analgesic Tablets or Capsules Coumadin Korlgesic Salflex  Anacin Extra Strength Analgesic tablets or capsules CP-2 Tablets Lanoril Salicylate  Anaprox Cuprimine Capsules Levenox Salocol  Anexsia-D Dalteparin Magan Salsalate  Anodynos Darvon compound Magnesium Salicylate Sine-off  Ansaid Dasin Capsules Magsal Sodium Salicylate  Anturane Depen Capsules Marnal Soma  APF Arthritis pain formula Dewitt's Pills Measurin Stanback  Argesic Dia-Gesic Meclofenamic Sulfinpyrazone  Arthritis Bayer Timed Release Aspirin Diclofenac Meclomen Sulindac  Arthritis pain formula Anacin Dicumarol Medipren Supac  Analgesic (Safety coated) Arthralgen Diffunasal Mefanamic Suprofen  Arthritis Strength Bufferin Dihydrocodeine Mepro Compound Suprol  Arthropan liquid Dopirydamole Methcarbomol with Aspirin Synalgos  ASA tablets/Enseals Disalcid Micrainin Tagament  Ascriptin Doan's Midol Talwin  Ascriptin A/D Dolene Mobidin Tanderil  Ascriptin Extra Strength Dolobid Moblgesic Ticlid  Ascriptin with Codeine Doloprin or Doloprin with Codeine Momentum Tolectin  Asperbuf Duoprin Mono-gesic Trendar  Aspergum Duradyne Motrin or Motrin IB Triminicin  Aspirin  plain, buffered or enteric coated Durasal Myochrisine Trigesic  Aspirin Suppositories Easprin Nalfon Trillsate  Aspirin with Codeine Ecotrin Regular or Extra Strength Naprosyn Uracel  Atromid-S Efficin Naproxen Ursinus  Auranofin Capsules Elmiron Neocylate Vanquish  Axotal Emagrin Norgesic Verin  Azathioprine Empirin or Empirin with Codeine Normiflo Vitamin E  Azolid Emprazil Nuprin Voltaren  Bayer Aspirin plain, buffered or children's or timed BC Tablets or powders Encaprin Orgaran Warfarin Sodium  Buff-a-Comp Enoxaparin Orudis Zorpin  Buff-a-Comp with Codeine Equegesic Os-Cal-Gesic   Buffaprin Excedrin plain, buffered or Extra Strength Oxalid   Bufferin Arthritis Strength Feldene Oxphenbutazone   Bufferin plain or Extra Strength Feldene Capsules Oxycodone with Aspirin   Bufferin with Codeine Fenoprofen Fenoprofen Pabalate or Pabalate-SF   Buffets II Flogesic Panagesic   Buffinol plain or Extra Strength Florinal or Florinal with Codeine Panwarfarin   Buf-Tabs Flurbiprofen Penicillamine   Butalbital Compound Four-way cold tablets Penicillin   Butazolidin Fragmin Pepto-Bismol   Carbenicillin Geminisyn Percodan   Carna Arthritis Reliever Geopen Persantine   Carprofen Gold's salt Persistin   Chloramphenicol Goody's Phenylbutazone   Chloromycetin Haltrain Piroxlcam   Clmetidine heparin Plaquenil   Cllnoril Hyco-pap Ponstel   Clofibrate Hydroxy chloroquine Propoxyphen         Before stopping any of these medications, be sure to consult the physician who ordered them.  Some, such as Coumadin (Warfarin) are ordered to prevent or treat serious conditions such as "deep thrombosis", "pumonary embolisms", and other heart problems.  The amount of time that you may need off of the medication may also vary with the medication and the reason for which you were taking it.  If you are taking any of these medications, please make sure you notify your pain physician before you undergo any  procedures.         Epidural Steroid Injection Patient Information  Description: The epidural space surrounds the nerves as they exit the spinal cord.  In some patients, the nerves can be compressed and inflamed by a bulging disc or a tight spinal canal (spinal stenosis).  By injecting steroids into the epidural space, we can bring irritated nerves into direct contact with a potentially helpful medication.  These steroids act directly on the irritated nerves and can reduce swelling and inflammation which often leads to decreased pain.  Epidural steroids may be injected anywhere along the spine and from the neck to the low back depending upon the location of your pain.   After numbing the skin with local anesthetic (like Novocaine), a small needle is passed   into the epidural space slowly.  You may experience a sensation of pressure while this is being done.  The entire block usually last less than 10 minutes.  Conditions which may be treated by epidural steroids:   Low back and leg pain  Neck and arm pain  Spinal stenosis  Post-laminectomy syndrome  Herpes zoster (shingles) pain  Pain from compression fractures  Preparation for the injection:  1. Do not eat any solid food or dairy products within 8 hours of your appointment.  2. You may drink clear liquids up to 3 hours before appointment.  Clear liquids include water, black coffee, juice or soda.  No milk or cream please. 3. You may take your regular medication, including pain medications, with a sip of water before your appointment  Diabetics should hold regular insulin (if taken separately) and take 1/2 normal NPH dos the morning of the procedure.  Carry some sugar containing items with you to your appointment. 4. A driver must accompany you and be prepared to drive you home after your procedure.  5. Bring all your current medications with your. 6. An IV may be inserted and sedation may be given at the discretion of the  physician.   7. A blood pressure cuff, EKG and other monitors will often be applied during the procedure.  Some patients may need to have extra oxygen administered for a short period. 8. You will be asked to provide medical information, including your allergies, prior to the procedure.  We must know immediately if you are taking blood thinners (like Coumadin/Warfarin)  Or if you are allergic to IV iodine contrast (dye). We must know if you could possible be pregnant.  Possible side-effects:  Bleeding from needle site  Infection (rare, may require surgery)  Nerve injury (rare)  Numbness & tingling (temporary)  Difficulty urinating (rare, temporary)  Spinal headache ( a headache worse with upright posture)  Light -headedness (temporary)  Pain at injection site (several days)  Decreased blood pressure (temporary)  Weakness in arm/leg (temporary)  Pressure sensation in back/neck (temporary)  Call if you experience:  Fever/chills associated with headache or increased back/neck pain.  Headache worsened by an upright position.  New onset weakness or numbness of an extremity below the injection site  Hives or difficulty breathing (go to the emergency room)  Inflammation or drainage at the infection site  Severe back/neck pain  Any new symptoms which are concerning to you  Please note:  Although the local anesthetic injected can often make your back or neck feel good for several hours after the injection, the pain will likely return.  It takes 3-7 days for steroids to work in the epidural space.  You may not notice any pain relief for at least that one week.  If effective, we will often do a series of three injections spaced 3-6 weeks apart to maximally decrease your pain.  After the initial series, we generally will wait several months before considering a repeat injection of the same type.  If you have any questions, please call [phone] [facility]  General Discharge Instructions :  If you need to reach your doctor call: Monday-Friday 8:00 am - 4:00 pm at [phone] or toll free [phone].  After clinic hours [phone] to have operator reach doctor.  Bring all of your medication bottles to all your appointments in the pain clinic.  To cancel or reschedule your appointment with Pain Management please remember to  call 24 hours in advance to avoid a fee.  Refer to the educational materials which you have been given on: General Risks, I had my Procedure. Discharge Instructions, Post Sedation.  Post Procedure Instructions:  The drugs you were given will stay in your system until tomorrow, so for the next 24 hours you should not drive, make any legal decisions or drink any alcoholic beverages.  You may eat anything you prefer, but it is better to start with liquids then soups and crackers, and gradually work up to solid foods.  Please notify your doctor immediately if you have any unusual bleeding, trouble breathing or pain that is not related to your normal pain.  Depending on the type of procedure that was done, some parts of your body may feel week and/or numb.  This usually clears up by tonight or the next day.  Walk with the use of an assistive device or accompanied by an adult for the 24 hours.  You may use ice on the affected area for the first 24 hours.  Put ice in a Ziploc bag and cover with a towel and place against area 15 minutes on 15 minutes off.  You may switch to heat after 24 hours.  Prescription given for oxycodone.

## 2017-04-15 NOTE — Progress Notes (Signed)
Safety precautions to be maintained throughout the outpatient stay will include: orient to surroundings, keep bed in low position, maintain call bell within reach at all times, provide assistance with transfer out of bed and ambulation.  

## 2017-04-15 NOTE — Progress Notes (Signed)
Patient's Name: [name]  MRN: [id number]  Referring Provider: No ref. provider found  DOB: Jun 13, 1970  PCP: Patient, No Pcp Per  DOS: 04/15/2017  Note by: Gillis Santa, MD  Service setting: Ambulatory outpatient  Specialty: Interventional Pain Management  Patient type: Established  Location: [facility] (AMB) Pain Management Facility  Visit type: Interventional Procedure   Primary Reason for Visit: Interventional Pain Management Treatment. CC: Back Pain (lower)  Procedure:  Anesthesia, Analgesia, Anxiolysis:  Type: Therapeutic Inter-Laminar Epidural Steroid Injection Region: Lumbar Level: L4-5 Level. Laterality: Left-Sided         Type: Local Anesthesia with Moderate (Conscious) Sedation Local Anesthetic: Lidocaine 1% Route: Intravenous (IV) IV Access: Secured Sedation: Meaningful verbal contact was maintained at all times during the procedure  Indication(s): Analgesia and Anxiety   Indications: 1. Lumbar radiculopathy   2. Lumbar degenerative disc disease   3. Chronic left-sided low back pain with left-sided sciatica   4. Chronic pain syndrome    Pain Score: Pre-procedure: 7 /10 Post-procedure: 4 /10  Pre-op Assessment:  [name] is a 46 y.o. (year old), female patient, seen today for interventional treatment. She  has a past surgical history that includes Abdominal hysterectomy; Cholecystectomy; and Hand surgery. [name] has a current medication list which includes the following prescription(s): acetaminophen, metoprolol succinate, oxycodone-acetaminophen, ranitidine hcl, temazepam, and tramadol, and the following Facility-Administered Medications: fentanyl. Her primarily concern today is the Back Pain (lower)  Initial Vital Signs: Blood pressure (!) 157/71, pulse 69, temperature 98.2 F (36.8 C), resp. rate 16, height 5\' 1"  (1.549 m), weight 120 lb (54.4 kg), SpO2 100 %. BMI: Estimated body mass index is 22.67 kg/m as calculated from the following:   Height as of this  encounter: 5\' 1"  (1.549 m).   Weight as of this encounter: 120 lb (54.4 kg).  Risk Assessment: Allergies: Reviewed. She is allergic to buprenorphine; cefaclor; gabapentin; levaquin [levofloxacin in d5w]; ibuprofen; levofloxacin; macrobid [nitrofurantoin monohyd macro]; naproxen; nitrofurantoin; and penicillins.  Allergy Precautions: None required Coagulopathies: Reviewed. None identified.  Blood-thinner therapy: None at this time Active Infection(s): Reviewed. None identified. [name] is afebrile  Site Confirmation: Ms. Peri was asked to confirm the procedure and laterality before marking the site Procedure checklist: Completed Consent: Before the procedure and under the influence of no sedative(s), amnesic(s), or anxiolytics, the patient was informed of the treatment options, risks and possible complications. To fulfill our ethical and legal obligations, as recommended by the American Medical Association's Code of Ethics, I have informed the patient of my clinical impression; the nature and purpose of the treatment or procedure; the risks, benefits, and possible complications of the intervention; the alternatives, including doing nothing; the risk(s) and benefit(s) of the alternative treatment(s) or procedure(s); and the risk(s) and benefit(s) of doing nothing. The patient was provided information about the general risks and possible complications associated with the procedure. These may include, but are not limited to: failure to achieve desired goals, infection, bleeding, organ or nerve damage, allergic reactions, paralysis, and death. In addition, the patient was informed of those risks and complications associated to Spine-related procedures, such as failure to decrease pain; infection (i.e.: Meningitis, epidural or intraspinal abscess); bleeding (i.e.: epidural hematoma, subarachnoid hemorrhage, or any other type of intraspinal or peri-dural bleeding); organ or nerve damage (i.e.: Any type of  peripheral nerve, nerve root, or spinal cord injury) with subsequent damage to sensory, motor, and/or autonomic systems, resulting in permanent pain, numbness, and/or weakness of one or several areas of the  body; allergic reactions; (i.e.: anaphylactic reaction); and/or death. Furthermore, the patient was informed of those risks and complications associated with the medications. These include, but are not limited to: allergic reactions (i.e.: anaphylactic or anaphylactoid reaction(s)); adrenal axis suppression; blood sugar elevation that in diabetics may result in ketoacidosis or comma; water retention that in patients with history of congestive heart failure may result in shortness of breath, pulmonary edema, and decompensation with resultant heart failure; weight gain; swelling or edema; medication-induced neural toxicity; particulate matter embolism and blood vessel occlusion with resultant organ, and/or nervous system infarction; and/or aseptic necrosis of one or more joints. Finally, the patient was informed that Medicine is not an exact science; therefore, there is also the possibility of unforeseen or unpredictable risks and/or possible complications that may result in a catastrophic outcome. The patient indicated having understood very clearly. We have given the patient no guarantees and we have made no promises. Enough time was given to the patient to ask questions, all of which were answered to the patient's satisfaction. [name] has indicated that she wanted to continue with the procedure. Attestation: I, the ordering provider, attest that I have discussed with the patient the benefits, risks, side-effects, alternatives, likelihood of achieving goals, and potential problems during recovery for the procedure that I have provided informed consent. Date: 04/15/2017; Time: 8:29 AM  Pre-Procedure Preparation:  Monitoring: As per clinic protocol. Respiration, ETCO2, SpO2, BP, heart rate and rhythm  monitor placed and checked for adequate function Safety Precautions: Patient was assessed for positional comfort and pressure points before starting the procedure. Time-out: I initiated and conducted the "Time-out" before starting the procedure, as per protocol. The patient was asked to participate by confirming the accuracy of the "Time Out" information. Verification of the correct person, site, and procedure were performed and confirmed by me, the nursing staff, and the patient. "Time-out" conducted as per Joint Commission's Universal Protocol (UP.01.01.01). "Time-out" Date & Time: 04/15/2017; 0835 hrs.  Description of Procedure Process:   Position: Prone with head of the table was raised to facilitate breathing. Target Area: The interlaminar space, initially targeting the lower laminar border of the superior vertebral body. Approach: Paramedial approach. Area Prepped: Entire Posterior Lumbar Region Prepping solution: ChloraPrep (2% chlorhexidine gluconate and 70% isopropyl alcohol) Safety Precautions: Aspiration looking for blood return was conducted prior to all injections. At no point did we inject any substances, as a needle was being advanced. No attempts were made at seeking any paresthesias. Safe injection practices and needle disposal techniques used. Medications properly checked for expiration dates. SDV (single dose vial) medications used. Description of the Procedure: Protocol guidelines were followed. The procedure needle was introduced through the skin, ipsilateral to the reported pain, and advanced to the target area. Bone was contacted and the needle walked caudad, until the lamina was cleared. The epidural space was identified using "loss-of-resistance technique" with 2-3 ml of PF-NaCl (0.9% NSS), in a 5cc LOR glass syringe. Vitals:   04/15/17 0857 04/15/17 0907 04/15/17 0917 04/15/17 0927  BP: (!) 163/103 (!) 159/77 (!) 153/87 (!) 159/77  Pulse:      Resp: 12 14 20 18   Temp:  98.1  F (36.7 C)  (!) 97.1 F (36.2 C)  SpO2: 100% 100% 100% 100%  Weight:      Height:        Start Time: 0835 hrs. End Time: 0856 hrs. Materials:  Needle(s) Type: Epidural needle Gauge: 17G Length: 3.5-in Medication(s): We administered fentaNYL, lactated ringers, iopamidol, ropivacaine (  PF) 2 mg/mL (0.2%), sodium chloride flush, dexamethasone, and lidocaine (PF). Please see chart orders for dosing details. 6 cc solution made of 4 cc of preservative-free saline, 1 cc of 0.2% ropivacaine, 1 cc of Decadron 10 mg/cc. Imaging Guidance (Spinal):  Type of Imaging Technique: Fluoroscopy Guidance (Spinal) Indication(s): Assistance in needle guidance and placement for procedures requiring needle placement in or near specific anatomical locations not easily accessible without such assistance. Exposure Time: Please see nurses notes. Contrast: Before injecting any contrast, we confirmed that the patient did not have an allergy to iodine, shellfish, or radiological contrast. Once satisfactory needle placement was completed at the desired level, radiological contrast was injected. Contrast injected under live fluoroscopy. No contrast complications. See chart for type and volume of contrast used. Fluoroscopic Guidance: I was personally present during the use of fluoroscopy. "Tunnel Vision Technique" used to obtain the best possible view of the target area. Parallax error corrected before commencing the procedure. "Direction-depth-direction" technique used to introduce the needle under continuous pulsed fluoroscopy. Once target was reached, antero-posterior, oblique, and lateral fluoroscopic projection used confirm needle placement in all planes. Images permanently stored in EMR. Interpretation: I personally interpreted the imaging intraoperatively. Adequate needle placement confirmed in multiple planes. Appropriate spread of contrast into desired area was observed. No evidence of afferent or efferent intravascular  uptake. No intrathecal or subarachnoid spread observed. Permanent images saved into the patient's record.  Antibiotic Prophylaxis:  Indication(s): None identified Antibiotic given: None  Post-operative Assessment:  EBL: None Complications: No immediate post-treatment complications observed by team, or reported by patient. Note: The patient tolerated the entire procedure well. A repeat set of vitals were taken after the procedure and the patient was kept under observation following institutional policy, for this type of procedure. Post-procedural neurological assessment was performed, showing return to baseline, prior to discharge. The patient was provided with post-procedure discharge instructions, including a section on how to identify potential problems. Should any problems arise concerning this procedure, the patient was given instructions to immediately contact us, at any time, without hesitation. In any case, we plan to contact the patient by telephone for a follow-up status report regarding this interventional procedure. Comments:  No additional relevant information. 5 out of 5 strength bilateral lower extremity: Plantar flexion, dorsiflexion, knee flexion, knee extension.  Plan of Care  Patient has seen pain psychology and is deemed low risk for substance abuse disorder.  She was prescribed oxycodone by her primary care physician prior to Thanksgiving for lower extremity pain for that she was having.  I will prescribe her oxycodone, quantity 5 as below in case she needs it for a pain flare that she has in the interim.  Patient's urine drug screen was appropriate.  [place] PMP was checked and was also appropriate.  Patient will complete opioid agreement at next visit after we discuss long term chronic opioid medication.  Requested Prescriptions   Signed Prescriptions Disp Refills  . oxyCODONE-acetaminophen (PERCOCET/ROXICET) 5-325 MG tablet 5 tablet 0    Sig: Take 1 tablet by mouth  every 6 (six) hours as needed for severe pain.    Imaging Orders     DG C-Arm 1-60 Min-No Report Procedure Orders    No procedure(s) ordered today    Medications ordered for procedure: Meds ordered this encounter  Medications  . fentaNYL (SUBLIMAZE) injection 25-100 mcg    Make sure Narcan is available in the pyxis when using this medication. In the event of respiratory depression (RR< 8/min): Titrate NARCAN (  naloxone) in increments of 0.1 to 0.2 mg IV at 2-3 minute intervals, until desired degree of reversal.  . lactated ringers infusion 1,000 mL  . iopamidol (ISOVUE-M) 41 % intrathecal injection 10 mL  . ropivacaine (PF) 2 mg/mL (0.2%) (NAROPIN) injection 2 mL  . sodium chloride flush (NS) 0.9 % injection 2 mL  . dexamethasone (DECADRON) injection 10 mg  . lidocaine (PF) (XYLOCAINE) 1 % injection 4.5 mL  . oxyCODONE-acetaminophen (PERCOCET/ROXICET) 5-325 MG tablet    Sig: Take 1 tablet by mouth every 6 (six) hours as needed for severe pain.    Dispense:  5 tablet    Refill:  0   Medications administered: We administered fentaNYL, lactated ringers, iopamidol, ropivacaine (PF) 2 mg/mL (0.2%), sodium chloride flush, dexamethasone, and lidocaine (PF).  See the medical record for exact dosing, route, and time of administration.  This SmartLink is deprecated. Use AVSMEDLIST instead to display the medication list for a patient. Disposition: Discharge home  Discharge Date & Time: 04/15/2017; 0930 hrs.   Physician-requested Follow-up: Return in about 2 weeks (around 04/29/2017) for Post Procedure Evaluation, Medication Management. Future Appointments  Date Time Provider [place]  04/30/2017 10:15 AM Gillis Santa, MD [facility] None   Primary Care Physician: Patient, No Pcp Per Location: [facility] Note by: Gillis Santa, MD Date: 04/15/2017; Time: 1:47 PM  Disclaimer:  Medicine is not an exact science. The only guarantee in medicine is that  nothing is guaranteed. It is important to note that the decision to proceed with this intervention was based on the information collected from the patient. The Data and conclusions were drawn from the patient's questionnaire, the interview, and the physical examination. Because the information was provided in large part by the patient, it cannot be guaranteed that it has not been purposely or unconsciously manipulated. Every effort has been made to obtain as much relevant data as possible for this evaluation. It is important to note that the conclusions that lead to this procedure are derived in large part from the available data. Always take into account that the treatment will also be dependent on availability of resources and existing treatment guidelines, considered by other Pain Management Practitioners as being common knowledge and practice, at the time of the intervention. For Medico-Legal purposes, it is also important to point out that variation in procedural techniques and pharmacological choices are the acceptable norm. The indications, contraindications, technique, and results of the above procedure should only be interpreted and judged by a Board-Certified Interventional Pain Specialist with extensive familiarity and expertise in the same exact procedure and technique.

## 2017-04-16 ENCOUNTER — Telehealth: Payer: Self-pay | Admitting: *Deleted

## 2017-04-16 NOTE — Telephone Encounter (Signed)
Voicemail left for patient to call our office if there are questions or concerns re; procedure on yesterday.  

## 2017-04-30 ENCOUNTER — Ambulatory Visit
Payer: BLUE CROSS/BLUE SHIELD | Attending: Student in an Organized Health Care Education/Training Program | Admitting: Student in an Organized Health Care Education/Training Program

## 2017-04-30 ENCOUNTER — Other Ambulatory Visit: Payer: Self-pay

## 2017-04-30 ENCOUNTER — Encounter: Payer: Self-pay | Admitting: Student in an Organized Health Care Education/Training Program

## 2017-04-30 VITALS — BP 133/59 | HR 66 | Temp 98.7°F | Resp 16 | Ht 61.0 in | Wt 120.0 lb

## 2017-04-30 DIAGNOSIS — M5136 Other intervertebral disc degeneration, lumbar region: Secondary | ICD-10-CM | POA: Diagnosis not present

## 2017-04-30 DIAGNOSIS — Z5181 Encounter for therapeutic drug level monitoring: Secondary | ICD-10-CM | POA: Insufficient documentation

## 2017-04-30 DIAGNOSIS — G894 Chronic pain syndrome: Secondary | ICD-10-CM | POA: Diagnosis not present

## 2017-04-30 DIAGNOSIS — G43709 Chronic migraine without aura, not intractable, without status migrainosus: Secondary | ICD-10-CM | POA: Insufficient documentation

## 2017-04-30 DIAGNOSIS — G8929 Other chronic pain: Secondary | ICD-10-CM | POA: Diagnosis not present

## 2017-04-30 DIAGNOSIS — Z886 Allergy status to analgesic agent status: Secondary | ICD-10-CM | POA: Diagnosis not present

## 2017-04-30 DIAGNOSIS — M4726 Other spondylosis with radiculopathy, lumbar region: Secondary | ICD-10-CM | POA: Insufficient documentation

## 2017-04-30 DIAGNOSIS — M5416 Radiculopathy, lumbar region: Secondary | ICD-10-CM

## 2017-04-30 DIAGNOSIS — M5442 Lumbago with sciatica, left side: Secondary | ICD-10-CM | POA: Diagnosis not present

## 2017-04-30 DIAGNOSIS — I10 Essential (primary) hypertension: Secondary | ICD-10-CM | POA: Diagnosis not present

## 2017-04-30 DIAGNOSIS — M47816 Spondylosis without myelopathy or radiculopathy, lumbar region: Secondary | ICD-10-CM

## 2017-04-30 DIAGNOSIS — M5116 Intervertebral disc disorders with radiculopathy, lumbar region: Secondary | ICD-10-CM | POA: Insufficient documentation

## 2017-04-30 DIAGNOSIS — Z880 Allergy status to penicillin: Secondary | ICD-10-CM | POA: Diagnosis not present

## 2017-04-30 DIAGNOSIS — F17210 Nicotine dependence, cigarettes, uncomplicated: Secondary | ICD-10-CM | POA: Insufficient documentation

## 2017-04-30 MED ORDER — OXYCODONE-ACETAMINOPHEN 5-325 MG PO TABS: 1.0000 | ORAL_TABLET | Freq: Two times a day (BID) | ORAL | 0 refills | Status: DC | PRN

## 2017-04-30 NOTE — Progress Notes (Signed)
Safety precautions to be maintained throughout the outpatient stay will include: orient to surroundings, keep bed in low position, maintain call bell within reach at all times, provide assistance with transfer out of bed and ambulation.  

## 2017-04-30 NOTE — Progress Notes (Signed)
Patient's Name: [name]  MRN: [id number]  Referring Provider: No ref. provider found  DOB: 25-May-1970  PCP: Patient, No Pcp Per  DOS: 04/30/2017  Note by: Gillis Santa, MD  Service setting: Ambulatory outpatient  Specialty: Interventional Pain Management  Location: [facility] (AMB) Pain Management Facility    Patient type: Established   Primary Reason(s) for Visit: Encounter for prescription drug management & post-procedure evaluation of chronic illness with mild to moderate exacerbation(Level of risk: moderate) CC: Back Pain (lower) and Leg Pain (left)  HPI  [name] is a 46 y.o. year old, female patient, who comes today for a post-procedure evaluation and medication management. She has Lumbar radiculopathy; Lumbar degenerative disc disease; Lumbar spondylosis; Chronic low back pain with bilateral sciatica; Chronic pain syndrome; Lumbar sprain; and Chronic migraine without aura without status migrainosus, not intractable on their problem list. Her primarily concern today is the Back Pain (lower) and Leg Pain (left)  Pain Assessment: Location: Lower Back Radiating: left leg to big toe Onset: More than a month ago Duration: Chronic pain Quality: Stabbing, Pounding(electricity) Severity: 8 /10 (self-reported pain score)  Note: Reported level is inconsistent with clinical observations. Clinically the patient looks like a 3/10 A 3/10 is viewed as "Moderate" and described as significantly interfering with activities of daily living (ADL). It becomes difficult to feed, bathe, get dressed, get on and off the toilet or to perform personal hygiene functions. Difficult to get in and out of bed or a chair without assistance. Very distracting. With effort, it can be ignored when deeply involved in activities.       When using our objective Pain Scale, levels between 6 and 10/10 are said to belong in an emergency room, as it progressively worsens from a 6/10, described as severely limiting, requiring  emergency care not usually available at an outpatient pain management facility. At a 6/10 level, communication becomes difficult and requires great effort. Assistance to reach the emergency department may be required. Facial flushing and profuse sweating along with potentially dangerous increases in heart rate and blood pressure will be evident. Effect on ADL: puts me in bed Timing: Constant Modifying factors: nothing right now  [name] was last seen on 04/15/2017 for a procedure. During today's appointment we reviewed [name]'s post-procedure results, as well as her outpatient medication regimen.  Further details on both, my assessment(s), as well as the proposed treatment plan, please see below.  Controlled Substance Pharmacotherapy Assessment REMS (Risk Evaluation and Mitigation Strategy)  Analgesic: Percocet 5 mg twice daily as needed, will provide prescription today MME/day: Approximately 15 mg/day.  Morley Kos, RN  04/30/2017 10:15 AM  Sign at close encounter Safety precautions to be maintained throughout the outpatient stay will include: orient to surroundings, keep bed in low position, maintain call bell within reach at all times, provide assistance with transfer out of bed and ambulation.    Pharmacokinetics: Liberation and absorption (onset of action): WNL Distribution (time to peak effect): WNL Metabolism and excretion (duration of action): WNL         Pharmacodynamics: Desired effects: Analgesia: [name] reports >50% benefit. Functional ability: Patient reports that medication allows her to accomplish basic ADLs Clinically meaningful improvement in function (CMIF): Sustained CMIF goals met Perceived effectiveness: Described as relatively effective but with some room for improvement Undesirable effects: Side-effects or Adverse reactions: None reported Monitoring: [place] PMP: Online review of the past 43-monthperiod conducted. Compliant with practice rules and  regulations Last UDS on record: Summary  Date Value Ref Range Status  02/04/2017 FINAL  Final    Comment:    ==================================================================== [facility] COMP DRUG ANALYSIS,UR ==================================================================== Test                             Result       Flag       Units Drug Present and Declared for Prescription Verification   Oxazepam                       >2703        EXPECTED   ng/mg creat   Temazepam                      >2703        EXPECTED   ng/mg creat    Oxazepam and temazepam are expected metabolites of diazepam.    Oxazepam is also an expected metabolite of other benzodiazepine    drugs, including chlordiazepoxide, prazepam, clorazepate,    halazepam, and temazepam.  Oxazepam and temazepam are available    as scheduled prescription medications.   Acetaminophen                  PRESENT      EXPECTED   Metoprolol                     PRESENT      EXPECTED Drug Present not Declared for Prescription Verification   Hydrocodone                    2520         UNEXPECTED ng/mg creat   Hydromorphone                  247          UNEXPECTED ng/mg creat   Dihydrocodeine                 168          UNEXPECTED ng/mg creat   Norhydrocodone                 2441         UNEXPECTED ng/mg creat    Sources of hydrocodone include scheduled prescription    medications. Hydromorphone, dihydrocodeine and norhydrocodone are    expected metabolites of hydrocodone. Hydromorphone and    dihydrocodeine are also available as scheduled prescription    medications.   Oxycodone                      1345         UNEXPECTED ng/mg creat   Oxymorphone                    1104         UNEXPECTED ng/mg creat   Noroxycodone                   3872         UNEXPECTED ng/mg creat   Noroxymorphone                 291          UNEXPECTED ng/mg creat    Sources of oxycodone are scheduled prescription medications.    Oxymorphone, noroxycodone,  and noroxymorphone are expected    metabolites of oxycodone. Oxymorphone is also available as  a    scheduled prescription medication.   Salicylate                     PRESENT      UNEXPECTED Drug Absent but Declared for Prescription Verification   Tramadol                       Not Detected UNEXPECTED ng/mg creat   Topiramate                     Not Detected UNEXPECTED   Diclofenac                     Not Detected UNEXPECTED    Diclofenac, as indicated in the declared medication list, is not    always detected even when used as directed. ==================================================================== Test                      Result    Flag   Units      Ref Range   Creatinine              74               mg/dL      >=20 ==================================================================== Declared Medications:  The flagging and interpretation on this report are based on the  following declared medications.  Unexpected results may arise from  inaccuracies in the declared medications.  **Note: The testing scope of this panel includes these medications:  Metoprolol (Toprol)  Temazepam (Restoril)  Topiramate (Topamax)  Tramadol (Ultram)  **Note: The testing scope of this panel does not include small to  moderate amounts of these reported medications:  Acetaminophen (Tylenol)  Diclofenac (Voltaren) ==================================================================== For clinical consultation, please call [phone]. ====================================================================    UDS interpretation: Compliant          Medication Assessment Form: Reviewed. Patient indicates being compliant with therapy Treatment compliance: Compliant Risk Assessment Profile: Aberrant behavior: See prior evaluations. None observed or detected today Comorbid factors increasing risk of overdose: See prior notes. No additional risks detected today Risk of substance use disorder (SUD):  Low Opioid Risk Tool - 04/30/17 1010      Family History of Substance Abuse   Alcohol  Negative    Illegal Drugs  Negative    Rx Drugs  Negative      Personal History of Substance Abuse   Alcohol  Negative    Illegal Drugs  Negative    Rx Drugs  Negative      Age   Age between 87-45 years   No      History of Preadolescent Sexual Abuse   History of Preadolescent Sexual Abuse  Negative or Female      Psychological Disease   Psychological Disease  Negative    Depression  Negative      Total Score   Opioid Risk Tool Scoring  0    Opioid Risk Interpretation  Low Risk      ORT Scoring interpretation table:  Score <3 = Low Risk for SUD  Score between 4-7 = Moderate Risk for SUD  Score >8 = High Risk for Opioid Abuse   Risk Mitigation Strategies:  Patient Counseling: Completed today. Counseling provided to patient as per "Patient Counseling Document". Document signed by patient, attesting to counseling and understanding Patient-Prescriber Agreement (PPA): Obtained today  Notification to other healthcare providers: Written and sent today  Pharmacologic  Plan: Today we will take over the chronic pain medication management and from this point on our medication agreement with this patient is active  Post-Procedure Assessment  04/15/2017 Procedure: Left L4-L5 ESI Pre-procedure pain score:  7/10 Post-procedure pain score: 4/10         Influential Factors: BMI: 22.67 kg/m Intra-procedural challenges: None observed.         Assessment challenges: None detected.              Reported side-effects: None.        Post-procedural adverse reactions or complications: None reported         Sedation: Please see nurses note. When no sedatives are used, the analgesic levels obtained are directly associated to the effectiveness of the local anesthetics. However, when sedation is provided, the level of analgesia obtained during the initial 1 hour following the intervention, is believed to be the  result of a combination of factors. These factors may include, but are not limited to: 1. The effectiveness of the local anesthetics used. 2. The effects of the analgesic(s) and/or anxiolytic(s) used. 3. The degree of discomfort experienced by the patient at the time of the procedure. 4. The patients ability and reliability in recalling and recording the events. 5. The presence and influence of possible secondary gains and/or psychosocial factors. Reported result: Relief experienced during the 1st hour after the procedure: 100 % (Ultra-Short Term Relief)            Interpretative annotation: Clinically appropriate result. Analgesia during this period is likely to be Local Anesthetic and/or IV Sedative (Analgesic/Anxiolytic) related.          Effects of local anesthetic: The analgesic effects attained during this period are directly associated to the localized infiltration of local anesthetics and therefore cary significant diagnostic value as to the etiological location, or anatomical origin, of the pain. Expected duration of relief is directly dependent on the pharmacodynamics of the local anesthetic used. Long-acting (4-6 hours) anesthetics used.  Reported result: Relief during the next 4 to 6 hour after the procedure: 100 % (Short-Term Relief)            Interpretative annotation: Clinically appropriate result. Analgesia during this period is likely to be Local Anesthetic-related.          Long-term benefit: Defined as the period of time past the expected duration of local anesthetics (1 hour for short-acting and 4-6 hours for long-acting). With the possible exception of prolonged sympathetic blockade from the local anesthetics, benefits during this period are typically attributed to, or associated with, other factors such as analgesic sensory neuropraxia, antiinflammatory effects, or beneficial biochemical changes provided by agents other than the local anesthetics.  Reported result: Extended  relief following procedure: 0 % (Long-Term Relief)            Interpretative annotation: Unexpected result. No benefit. Therapeutic failure. Limited inflammation. Possible mechanical aggravating factors.          Current benefits: Defined as reported results that persistent at this point in time.   Analgesia: 0 %            Function: No benefit ROM: No benefit Interpretative annotation: No benefit. Therapeutic failure. Results would argue against repeating therapy.          Interpretation: Results would suggest failure of therapy in achieving desired goal(s).                  Plan:  Please see "Plan of Care"  for details.        Laboratory Chemistry    Renal Function Markers Lab Results  Component Value Date   BUN 9 03/24/2013   CREATININE 0.97 03/24/2013   GFRAA 82 (L) 03/24/2013   GFRNONAA 71 (L) 03/24/2013                 Hepatic Function Markers Lab Results  Component Value Date   AST 17 03/24/2013   ALT 8 03/24/2013   ALBUMIN 3.6 03/24/2013   ALKPHOS 90 03/24/2013   LIPASE 25 03/24/2013                 Electrolytes Lab Results  Component Value Date   NA 138 03/24/2013   K 4.3 03/24/2013   CL 105 03/24/2013   CALCIUM 9.6 03/24/2013                 Neuropathy Markers No results found for: VITAMINB12, FOLATE, HGBA1C, HIV               Bone Pathology Markers No results found for: VD25OH, VD125OH2TOT, EX5170YF7, CB4496PR9, 25OHVITD1, 25OHVITD2, 25OHVITD3, TESTOFREE, TESTOSTERONE               Coagulation Parameters Lab Results  Component Value Date   PLT 307 03/24/2013                 Cardiovascular Markers Lab Results  Component Value Date   HGB 13.4 03/24/2013   HCT 39.7 03/24/2013                 CA Markers No results found for: CEA, CA125, LABCA2               Note: Lab results reviewed.  Meds   Current Outpatient Medications:  .  acetaminophen (TYLENOL) 500 MG tablet, Take 1,000 mg by mouth every 6 (six) hours as needed for mild pain,  moderate pain or headache. , Disp: , Rfl:  .  metoprolol succinate (TOPROL-XL) 100 MG 24 hr tablet, Take 100 mg by mouth daily. Take with or immediately following a meal., Disp: , Rfl:  .  oxyCODONE-acetaminophen (PERCOCET/ROXICET) 5-325 MG tablet, Take 1 tablet by mouth 2 (two) times daily as needed for severe pain., Disp: 60 tablet, Rfl: 0 .  RaNITidine HCl (ZANTAC PO), Take by mouth., Disp: , Rfl:  .  temazepam (RESTORIL) 30 MG capsule, Take by mouth., Disp: , Rfl:  .  traMADol (ULTRAM) 50 MG tablet, Take 100 mg by mouth every 8 (eight) hours as needed., Disp: , Rfl:   ROS  Constitutional: Denies any fever or chills Gastrointestinal: No reported hemesis, hematochezia, vomiting, or acute GI distress Musculoskeletal: Denies any acute onset joint swelling, redness, loss of ROM, or weakness Neurological: No reported episodes of acute onset apraxia, aphasia, dysarthria, agnosia, amnesia, paralysis, loss of coordination, or loss of consciousness  Allergies  Ms. Marcelli is allergic to buprenorphine; cefaclor; gabapentin; levaquin [levofloxacin in d5w]; ibuprofen; levofloxacin; macrobid [nitrofurantoin monohyd macro]; naproxen; nitrofurantoin; and penicillins.  [place]  Drug: Ms. Purtle  reports that she does not use drugs. Alcohol:  reports that she does not drink alcohol. Tobacco:  reports that she has been smoking cigarettes.  She has a 20.00 pack-year smoking history. she has never used smokeless tobacco. Medical:  has a past medical history of Chronic back pain, Hypertension, and Migraine. Surgical: Ms. Valtierra  has a past surgical history that includes Abdominal hysterectomy; Cholecystectomy; and Hand surgery. Family: family history includes Cancer in her  other; Diabetes in her other; Seizures in her other; Stroke in her other.  Constitutional Exam  General appearance: Well nourished, well developed, and well hydrated. In no apparent acute distress Vitals:   04/30/17 1005  BP: (!) 133/59   Pulse: 66  Resp: 16  Temp: 98.7 F (37.1 C)  TempSrc: Oral  SpO2: 100%  Weight: 120 lb (54.4 kg)  Height: _0  (1.549 m)   BMI Assessment: Estimated body mass index is 22.67 kg/m as calculated from the following:   Height as of this encounter: _1  (1.549 m).   Weight as of this encounter: 120 lb (54.4 kg).  BMI interpretation table: BMI level Category Range association with higher incidence of chronic pain  <18 kg/m2 Underweight   18.5-24.9 kg/m2 Ideal body weight   25-29.9 kg/m2 Overweight Increased incidence by 20%  30-34.9 kg/m2 Obese (Class I) Increased incidence by 68%  35-39.9 kg/m2 Severe obesity (Class II) Increased incidence by 136%  >40 kg/m2 Extreme obesity (Class III) Increased incidence by 254%   BMI Readings from Last 4 Encounters:  04/30/17 22.67 kg/m  04/15/17 22.67 kg/m  03/19/17 23.20 kg/m  02/04/17 22.67 kg/m   Wt Readings from Last 4 Encounters:  04/30/17 120 lb (54.4 kg)  04/15/17 120 lb (54.4 kg)  03/19/17 122 lb 12.8 oz (55.7 kg)  02/04/17 120 lb (54.4 kg)  Psych/Mental status: Alert, oriented x 3 (person, place, & time)       Eyes: PERLA Respiratory: No evidence of acute respiratory distress  Cervical Spine Area Exam    Skin & Axial Inspection: No masses, redness, edema, swelling, or associated skin lesions Alignment: Symmetrical Functional ROM: Unrestricted ROM      Stability: No instability detected Muscle Tone/Strength: Functionally intact. No obvious neuro-muscular anomalies detected. Sensory (Neurological): Unimpaired Palpation: No palpable anomalies              Upper Extremity (UE) Exam    Side: Right upper extremity  Side: Left upper extremity  Skin & Extremity Inspection: Skin color, temperature, and hair growth are WNL. No peripheral edema or cyanosis. No masses, redness, swelling, asymmetry, or associated skin lesions. No contractures.  Skin & Extremity Inspection: Skin color, temperature, and hair growth are WNL. No  peripheral edema or cyanosis. No masses, redness, swelling, asymmetry, or associated skin lesions. No contractures.  Functional ROM: Unrestricted ROM          Functional ROM: Unrestricted ROM          Muscle Tone/Strength: Functionally intact. No obvious neuro-muscular anomalies detected.  Muscle Tone/Strength: Functionally intact. No obvious neuro-muscular anomalies detected.  Sensory (Neurological): Unimpaired          Sensory (Neurological): Unimpaired          Palpation: No palpable anomalies              Palpation: No palpable anomalies              Specialized Test(s): Deferred         Specialized Test(s): Deferred          Thoracic Spine Area Exam  Skin & Axial Inspection: No masses, redness, or swelling Alignment: Symmetrical Functional ROM: Unrestricted ROM Stability: No instability detected Muscle Tone/Strength: Functionally intact. No obvious neuro-muscular anomalies detected. Sensory (Neurological): Unimpaired Muscle strength & Tone: No palpable anomalies  Lumbar Spine Area Exam  Skin & Axial Inspection: No masses, redness, or swelling Alignment: Symmetrical Functional ROM: Unrestricted ROM      Stability: No instability  detected Muscle Tone/Strength: Functionally intact. No obvious neuro-muscular anomalies detected. Sensory (Neurological): Unimpaired Palpation: No palpable anomalies       Provocative Tests: Lumbar Hyperextension and rotation test: evaluation deferred today       Lumbar Lateral bending test: evaluation deferred today       Patrick's Maneuver: evaluation deferred today                    Gait & Posture Assessment  Ambulation: Unassisted Gait: Relatively normal for age and body habitus Posture: WNL   Lower Extremity Exam    Side: Right lower extremity  Side: Left lower extremity  Skin & Extremity Inspection: Skin color, temperature, and hair growth are WNL. No peripheral edema or cyanosis. No masses, redness, swelling, asymmetry, or associated skin  lesions. No contractures.  Skin & Extremity Inspection: Skin color, temperature, and hair growth are WNL. No peripheral edema or cyanosis. No masses, redness, swelling, asymmetry, or associated skin lesions. No contractures.  Functional ROM: Unrestricted ROM          Functional ROM: Unrestricted ROM          Muscle Tone/Strength: Functionally intact. No obvious neuro-muscular anomalies detected.  Muscle Tone/Strength: Functionally intact. No obvious neuro-muscular anomalies detected.  Sensory (Neurological): Unimpaired  Sensory (Neurological): Unimpaired  Palpation: No palpable anomalies  Palpation: No palpable anomalies   Assessment  Primary Diagnosis & Pertinent Problem List: The primary encounter diagnosis was Lumbar radiculopathy. Diagnoses of Lumbar degenerative disc disease, Chronic left-sided low back pain with left-sided sciatica, Chronic pain syndrome, and Lumbar spondylosis were also pertinent to this visit.  Status Diagnosis  Controlled Controlled Controlled 1. Lumbar radiculopathy   2. Lumbar degenerative disc disease   3. Chronic left-sided low back pain with left-sided sciatica   4. Chronic pain syndrome   5. Lumbar spondylosis      46 year old female with a past medical history of hypertension and migraine who presents with axial low back pain with radiation to bilateral legs (left greater than right) secondary to L4-L5 radiculopathy as evidenced on lumbar MRI.  Patient is status post lumbar epidural steroid injection on 04/15/2017 and presents for follow-up.  Patient endorses no significant pain relief after her injection.  She states that it did not help with her back pain or radicular symptoms.  In regards to medication management, patient takes tramadol 100 mg 3 times a day which she states is not effective.  She is also on temazepam 30 mg nightly.  She has tried gabapentin and Lyrica in the past which were not effective and resulted in side effects of sedation and weird dreams.   I also trial the patient on Topamax 25 mg twice daily she states that this medication resulted in GI symptoms.  Patient has seen pain psychology and urine drug screen has been completed and appropriate.  I will have the patient complete opioid agreement with our clinic today.  Prescription as below.  Follow-up in approximately 1 month.  Time Note: Greater than 50% of the 25 minute(s) of face-to-face time spent with Ms. Olejniczak, was spent in counseling/coordination of care regarding: the appropriate use of the pain scale, the results, interpretation and significance of  her recent diagnostic interventional treatment(s), the appropriate use of her medications, realistic expectations, the goals of pain management (increased in functionality) and the medication agreement.   Plan of Care  Pharmacotherapy (Medications Ordered): Meds ordered this encounter  Medications  . oxyCODONE-acetaminophen (PERCOCET/ROXICET) 5-325 MG tablet    Sig: Take  1 tablet by mouth 2 (two) times daily as needed for severe pain.    Dispense:  60 tablet    Refill:  0    For chronic pain To last for 30 days from fill date    Pharmacological management options:  Opioid Analgesics: The patient was informed that there is no guarantee that she would be a candidate for opioid analgesics. The decision will be made following CDC guidelines. This decision will be based on the results of diagnostic studies, as well as Ms. Pisarski's risk profile. Consider tramadol, Nucynta, Norco 5 mg twice a day when necessary   Membrane stabilizer: Gabapentin: Resulted in sedation, Lyrica: Mood changes and worsening depression. We'll trial Topamax. No history of glaucoma or kidney stones.  Muscle relaxant: To be determined at a later time  NSAID: Medically contraindicated allergy to NSAIDs result in rash and pruritus   Other analgesic(s): To be determined at a later time    Interventional management options:  PRN Procedures:   -Lumbar medial  branch nerve blocks -Bilateral SI joint -Sphenopalatine ganglion nerve block for migraine -Botox injections for migraines   Provider-requested follow-up: Return in about 4 weeks (around 05/28/2017) for Medication Management w BL.  Future Appointments  Date Time Provider [place]  05/27/2017 12:15 PM Gillis Santa, MD [facility] None    Primary Care Physician: Patient, No Pcp Per Location: [facility] Out[facility] Management Facility Note by: Gillis Santa, M.D Date: 04/30/2017; Time: 4:14 PM  Patient Instructions  1. Sign opioid agreement 2. Rx for Percocet for 1 month 3. Follow up in 1 month- we will discuss lumbar facets vs SI joint blocks at that time

## 2017-04-30 NOTE — Patient Instructions (Signed)
1. Sign opioid agreement 2. Rx for Percocet for 1 month 3. Follow up in 1 month- we will discuss lumbar facets vs SI joint blocks at that time

## 2017-05-27 ENCOUNTER — Encounter: Payer: BLUE CROSS/BLUE SHIELD | Admitting: Student in an Organized Health Care Education/Training Program

## 2017-06-04 ENCOUNTER — Ambulatory Visit
Payer: BLUE CROSS/BLUE SHIELD | Attending: Student in an Organized Health Care Education/Training Program | Admitting: Student in an Organized Health Care Education/Training Program

## 2017-06-04 ENCOUNTER — Other Ambulatory Visit: Payer: Self-pay

## 2017-06-04 ENCOUNTER — Encounter: Payer: Self-pay | Admitting: Student in an Organized Health Care Education/Training Program

## 2017-06-04 VITALS — BP 151/64 | HR 64 | Temp 97.7°F | Resp 16 | Ht 61.0 in | Wt 120.0 lb

## 2017-06-04 DIAGNOSIS — Z79899 Other long term (current) drug therapy: Secondary | ICD-10-CM | POA: Diagnosis not present

## 2017-06-04 DIAGNOSIS — Z886 Allergy status to analgesic agent status: Secondary | ICD-10-CM | POA: Diagnosis not present

## 2017-06-04 DIAGNOSIS — Z79891 Long term (current) use of opiate analgesic: Secondary | ICD-10-CM | POA: Diagnosis not present

## 2017-06-04 DIAGNOSIS — M5416 Radiculopathy, lumbar region: Secondary | ICD-10-CM | POA: Diagnosis not present

## 2017-06-04 DIAGNOSIS — G8929 Other chronic pain: Secondary | ICD-10-CM | POA: Diagnosis not present

## 2017-06-04 DIAGNOSIS — Z5181 Encounter for therapeutic drug level monitoring: Secondary | ICD-10-CM | POA: Diagnosis present

## 2017-06-04 DIAGNOSIS — Z880 Allergy status to penicillin: Secondary | ICD-10-CM | POA: Insufficient documentation

## 2017-06-04 DIAGNOSIS — M79605 Pain in left leg: Secondary | ICD-10-CM | POA: Diagnosis present

## 2017-06-04 DIAGNOSIS — M47818 Spondylosis without myelopathy or radiculopathy, sacral and sacrococcygeal region: Secondary | ICD-10-CM

## 2017-06-04 DIAGNOSIS — M1990 Unspecified osteoarthritis, unspecified site: Secondary | ICD-10-CM | POA: Diagnosis not present

## 2017-06-04 DIAGNOSIS — M5442 Lumbago with sciatica, left side: Secondary | ICD-10-CM

## 2017-06-04 DIAGNOSIS — M51369 Other intervertebral disc degeneration, lumbar region without mention of lumbar back pain or lower extremity pain: Secondary | ICD-10-CM

## 2017-06-04 DIAGNOSIS — Z90710 Acquired absence of both cervix and uterus: Secondary | ICD-10-CM | POA: Diagnosis not present

## 2017-06-04 DIAGNOSIS — I10 Essential (primary) hypertension: Secondary | ICD-10-CM | POA: Diagnosis not present

## 2017-06-04 DIAGNOSIS — M5136 Other intervertebral disc degeneration, lumbar region: Secondary | ICD-10-CM

## 2017-06-04 DIAGNOSIS — Z9049 Acquired absence of other specified parts of digestive tract: Secondary | ICD-10-CM | POA: Insufficient documentation

## 2017-06-04 DIAGNOSIS — M461 Sacroiliitis, not elsewhere classified: Secondary | ICD-10-CM

## 2017-06-04 DIAGNOSIS — M4698 Unspecified inflammatory spondylopathy, sacral and sacrococcygeal region: Secondary | ICD-10-CM | POA: Diagnosis not present

## 2017-06-04 DIAGNOSIS — G894 Chronic pain syndrome: Secondary | ICD-10-CM | POA: Insufficient documentation

## 2017-06-04 DIAGNOSIS — M5116 Intervertebral disc disorders with radiculopathy, lumbar region: Secondary | ICD-10-CM | POA: Diagnosis not present

## 2017-06-04 DIAGNOSIS — F17210 Nicotine dependence, cigarettes, uncomplicated: Secondary | ICD-10-CM | POA: Insufficient documentation

## 2017-06-04 DIAGNOSIS — M549 Dorsalgia, unspecified: Secondary | ICD-10-CM | POA: Diagnosis present

## 2017-06-04 MED ORDER — OXYCODONE-ACETAMINOPHEN 5-325 MG PO TABS: 1.0000 | ORAL_TABLET | Freq: Two times a day (BID) | ORAL | 0 refills | Status: DC | PRN

## 2017-06-04 NOTE — Patient Instructions (Addendum)
1. Please stop Tramadol 2. UDS at next visit, should be negative for Tramadol 3. 2 week Rx for Percocet at current dose 4. At next visit, we can talk about readjustment of [place]  What are the risk, side effects and possible complications? Generally speaking, most procedures are safe.  However, with any procedure there are risks, side effects, and the possibility of complications.  The risks and complications are dependent upon the sites that are lesioned, or the type of nerve block to be performed.  The closer the procedure is to the spine, the more serious the risks are.  Great care is taken when placing the radio frequency needles, block needles or lesioning probes, but sometimes complications can occur. 1. Infection: Any time there is an injection through the skin, there is a risk of infection.  This is why sterile conditions are used for these blocks.  There are four possible types of infection. 1. Localized skin infection. 2. Central Nervous System Infection-This can be in the form of Meningitis, which can be deadly. 3. Epidural Infections-This can be in the form of an epidural abscess, which can cause pressure inside of the spine, causing compression of the spinal cord with subsequent paralysis. This would require an emergency surgery to decompress, and there are no guarantees that the patient would recover from the paralysis. 4. Discitis-This is an infection of the intervertebral discs.  It occurs in about 1% of discography procedures.  It is difficult to treat and it may lead to surgery.        2. Pain: the needles have to go through skin and soft tissues, will cause soreness.       3. Damage to internal structures:  The nerves to be lesioned may be near blood vessels or    other nerves which can be potentially damaged.       4. Bleeding: Bleeding is more common if the patient is taking blood thinners such as  aspirin, Coumadin, Ticiid, Plavix, etc., or  if he/she have some genetic predisposition  such as hemophilia. Bleeding into the spinal canal can cause compression of the spinal  cord with subsequent paralysis.  This would require an emergency surgery to  decompress and there are no guarantees that the patient would recover from the  paralysis.       5. Pneumothorax:  Puncturing of a lung is a possibility, every time a needle is introduced in  the area of the chest or upper back.  Pneumothorax refers to free air around the  collapsed lung(s), inside of the thoracic cavity (chest cavity).  Another two possible  complications related to a similar event would include: Hemothorax and Chylothorax.   These are variations of the Pneumothorax, where instead of air around the collapsed  lung(s), you may have blood or chyle, respectively.       6. Spinal headaches: They may occur with any procedures in the area of the spine.       7. Persistent CSF (Cerebro-Spinal Fluid) leakage: This is a rare problem, but may occur  with prolonged intrathecal or epidural catheters either due to the formation of a fistulous  track or a dural tear.       8. Nerve damage: By working so close to the spinal cord, there is always a possibility of  nerve damage, which could be as serious as a permanent spinal cord injury with  paralysis.       9. Death:  Although rare,  severe deadly allergic reactions known as "Anaphylactic  reaction" can occur to any of the medications used.      10. Worsening of the symptoms:  We can always make thing worse.  What are the chances of something like this happening? Chances of any of this occuring are extremely low.  By statistics, you have more of a chance of getting killed in a motor vehicle accident: while driving to the hospital than any of the above occurring .  Nevertheless, you should be aware that they are possibilities.  In general, it is similar to taking a shower.  Everybody knows that you can slip, hit your head and get killed.  Does that  mean that you should not shower again?  Nevertheless always keep in mind that statistics do not mean anything if you happen to be on the wrong side of them.  Even if a procedure has a 1 (one) in a 1,000,000 (million) chance of going wrong, it you happen to be that one..Also, keep in mind that by statistics, you have more of a chance of having something go wrong when taking medications.  Who should not have this procedure? If you are on a blood thinning medication (e.g. Coumadin, Plavix, see list of "Blood Thinners"), or if you have an active infection going on, you should not have the procedure.  If you are taking any blood thinners, please inform your physician.  How should I prepare for this procedure?  Do not eat or drink anything at least six hours prior to the procedure.  Bring a driver with you .  It cannot be a taxi.  Come accompanied by an adult that can drive you back, and that is strong enough to help you if your legs get weak or numb from the local anesthetic.  Take all of your medicines the morning of the procedure with just enough water to swallow them.  If you have diabetes, make sure that you are scheduled to have your procedure done first thing in the morning, whenever possible.  If you have diabetes, take only half of your insulin dose and notify our nurse that you have done so as soon as you arrive at the clinic.  If you are diabetic, but only take blood sugar pills (oral hypoglycemic), then do not take them on the morning of your procedure.  You may take them after you have had the procedure.  Do not take aspirin or any aspirin-containing medications, at least eleven (11) days prior to the procedure.  They may prolong bleeding.  Wear loose fitting clothing that may be easy to take off and that you would not mind if it got stained with Betadine or blood.  Do not wear any jewelry or perfume  Remove any nail coloring.  It will interfere with some of our monitoring  equipment.  NOTE: Remember that this is not meant to be interpreted as a complete list of all possible complications.  Unforeseen problems may occur.  BLOOD THINNERS The following drugs contain aspirin or other products, which can cause increased bleeding during surgery and should not be taken for 2 weeks prior to and 1 week after surgery.  If you should need take something for relief of minor pain, you may take acetaminophen which is found in Tylenol,m Datril, Anacin-3 and Panadol. It is not blood thinner. The products listed below are.  Do not take any of the products listed below in addition to any listed on your instruction sheet.  A.P.C  or A.P.C with Codeine Codeine Phosphate Capsules #3 Ibuprofen Ridaura  ABC compound Congesprin Imuran rimadil  Advil Cope Indocin Robaxisal  Alka-Seltzer Effervescent Pain Reliever and Antacid Coricidin or Coricidin-D  Indomethacin Rufen  Alka-Seltzer plus Cold Medicine Cosprin Ketoprofen S-A-C Tablets  Anacin Analgesic Tablets or Capsules Coumadin Korlgesic Salflex  Anacin Extra Strength Analgesic tablets or capsules CP-2 Tablets Lanoril Salicylate  Anaprox Cuprimine Capsules Levenox Salocol  Anexsia-D Dalteparin Magan Salsalate  Anodynos Darvon compound Magnesium Salicylate Sine-off  Ansaid Dasin Capsules Magsal Sodium Salicylate  Anturane Depen Capsules Marnal Soma  APF Arthritis pain formula Dewitt's Pills Measurin Stanback  Argesic Dia-Gesic Meclofenamic Sulfinpyrazone  Arthritis Bayer Timed Release Aspirin Diclofenac Meclomen Sulindac  Arthritis pain formula Anacin Dicumarol Medipren Supac  Analgesic (Safety coated) Arthralgen Diffunasal Mefanamic Suprofen  Arthritis Strength Bufferin Dihydrocodeine Mepro Compound Suprol  Arthropan liquid Dopirydamole Methcarbomol with Aspirin Synalgos  ASA tablets/Enseals Disalcid Micrainin Tagament  Ascriptin Doan's Midol Talwin  Ascriptin A/D Dolene Mobidin Tanderil  Ascriptin Extra Strength Dolobid  Moblgesic Ticlid  Ascriptin with Codeine Doloprin or Doloprin with Codeine Momentum Tolectin  Asperbuf Duoprin Mono-gesic Trendar  Aspergum Duradyne Motrin or Motrin IB Triminicin  Aspirin plain, buffered or enteric coated Durasal Myochrisine Trigesic  Aspirin Suppositories Easprin Nalfon Trillsate  Aspirin with Codeine Ecotrin Regular or Extra Strength Naprosyn Uracel  Atromid-S Efficin Naproxen Ursinus  Auranofin Capsules Elmiron Neocylate Vanquish  Axotal Emagrin Norgesic Verin  Azathioprine Empirin or Empirin with Codeine Normiflo Vitamin E  Azolid Emprazil Nuprin Voltaren  Bayer Aspirin plain, buffered or children's or timed BC Tablets or powders Encaprin Orgaran Warfarin Sodium  Buff-a-Comp Enoxaparin Orudis Zorpin  Buff-a-Comp with Codeine Equegesic Os-Cal-Gesic   Buffaprin Excedrin plain, buffered or Extra Strength Oxalid   Bufferin Arthritis Strength Feldene Oxphenbutazone   Bufferin plain or Extra Strength Feldene Capsules Oxycodone with Aspirin   Bufferin with Codeine Fenoprofen Fenoprofen Pabalate or Pabalate-SF   Buffets II Flogesic Panagesic   Buffinol plain or Extra Strength Florinal or Florinal with Codeine Panwarfarin   Buf-Tabs Flurbiprofen Penicillamine   Butalbital Compound Four-way cold tablets Penicillin   Butazolidin Fragmin Pepto-Bismol   Carbenicillin Geminisyn Percodan   Carna Arthritis Reliever Geopen Persantine   Carprofen Gold's salt Persistin   Chloramphenicol Goody's Phenylbutazone   Chloromycetin Haltrain Piroxlcam   Clmetidine heparin Plaquenil   Cllnoril Hyco-pap Ponstel   Clofibrate Hydroxy chloroquine Propoxyphen         Before stopping any of these medications, be sure to consult the physician who ordered them.  Some, such as Coumadin (Warfarin) are ordered to prevent or treat serious conditions such as "deep thrombosis", "pumonary embolisms", and other heart problems.  The amount of time that you may need off of the medication may also vary with  the medication and the reason for which you were taking it.  If you are taking any of these medications, please make sure you notify your pain physician before you undergo any procedures.         Sacroiliac (SI) Joint Injection Patient Information  Description: The sacroiliac joint connects the scrum (very low back and tailbone) to the ilium (a pelvic bone which also forms half of the hip joint).  Normally this joint experiences very little motion.  When this joint becomes inflamed or unstable low back and or hip and pelvis pain may result.  Injection of this joint with local anesthetics (numbing medicines) and steroids can provide diagnostic information and reduce pain.  This injection is performed with the aid of x-ray guidance  into the tailbone area while you are lying on your stomach.   You may experience an electrical sensation down the leg while this is being done.  You may also experience numbness.  We also may ask if we are reproducing your normal pain during the injection.  Conditions which may be treated SI injection:   Low back, buttock, hip or leg pain  Preparation for the Injection:  1. Do not eat any solid food or dairy products within 8 hours of your appointment.  2. You may drink clear liquids up to 3 hours before appointment.  Clear liquids include water, black coffee, juice or soda.  No milk or cream please. 3. You may take your regular medications, including pain medications with a sip of water before your appointment.  Diabetics should hold regular insulin (if take separately) and take 1/2 normal NPH dose the morning of the procedure.  Carry some sugar containing items with you to your appointment. 4. A driver must accompany you and be prepared to drive you home after your procedure. 5. Bring all of your current medications with you. 6. An IV may be inserted and sedation may be given at the discretion of the physician. 7. A blood pressure cuff, EKG and other monitors  will often be applied during the procedure.  Some patients may need to have extra oxygen administered for a short period.  8. You will be asked to provide medical information, including your allergies, prior to the procedure.  We must know immediately if you are taking blood thinners (like Coumadin/Warfarin) or if you are allergic to IV iodine contrast (dye).  We must know if you could possible be pregnant.  Possible side effects:   Bleeding from needle site  Infection (rare, may require surgery)  Nerve injury (rare)  Numbness & tingling (temporary)  A brief convulsion or seizure  Light-headedness (temporary)  Pain at injection site (several days)  Decreased blood pressure (temporary)  Weakness in the leg (temporary)   Call if you experience:   New onset weakness or numbness of an extremity below the injection site that last more than 8 hours.  Hives or difficulty breathing ( go to the emergency room)  Inflammation or drainage at the injection site  Any new symptoms which are concerning to you  Please note:  Although the local anesthetic injected can often make your back/ hip/ buttock/ leg feel good for several hours after the injections, the pain will likely return.  It takes 3-7 days for steroids to work in the sacroiliac area.  You may not notice any pain relief for at least that one week.  If effective, we will often do a series of three injections spaced 3-6 weeks apart to maximally decrease your pain.  After the initial series, we generally will wait some months before a repeat injection of the same type.  If you have any questions, please call [phone] [facility]

## 2017-06-04 NOTE — Progress Notes (Signed)
Nursing Pain Medication Assessment:  Safety precautions to be maintained throughout the outpatient stay will include: orient to surroundings, keep bed in low position, maintain call bell within reach at all times, provide assistance with transfer out of bed and ambulation.  Medication Inspection Compliance: Pill count conducted under aseptic conditions, in front of the patient. Neither the pills nor the bottle was removed from the patient's sight at any time. Once count was completed pills were immediately returned to the patient in their original bottle.  Medication: Oxycodone/APAP Pill/Patch Count: 0 of 60 pills remain Pill/Patch Appearance: Markings consistent with prescribed medication Bottle Appearance: Standard pharmacy container. Clearly labeled. Filled Date: 23 / 65 / 2018 Last Medication intake:  Ran out of medicine more than 48 hours ago

## 2017-06-04 NOTE — Progress Notes (Signed)
Patient's Name: [name]  MRN: [id number]  Referring Provider: No ref. provider found  DOB: March 26, 1971  PCP: [facility]  DOS: 06/04/2017  Note by: Gillis Santa, MD  Service setting: Ambulatory outpatient  Specialty: Interventional Pain Management  Location: [facility] (AMB) Pain Management Facility    Patient type: Established   Primary Reason(s) for Visit: Encounter for prescription drug management. (Level of risk: moderate)  CC: Back Pain (lower, middle) and Leg Pain (leg)  HPI  [name] is a 47 y.o. year old, female patient, who comes today for a medication management evaluation. She has Lumbar radiculopathy; Lumbar degenerative disc disease; Lumbar spondylosis; Chronic low back pain with bilateral sciatica; Chronic pain syndrome; Lumbar sprain; and Chronic migraine without aura without status migrainosus, not intractable on their problem list. Her primarily concern today is the Back Pain (lower, middle) and Leg Pain (leg)  Pain Assessment: Location: Lower, Medial Back Radiating: moves to left buttock and down left leg to big toe; the pain in toe feels like an electrical current Onset: More than a month ago Duration: Chronic pain Quality: Stabbing, Pounding, Constant Severity: 8 /10 (self-reported pain score)  Note: Clear symptom exaggeration. Reported level of pain is not compatible with clinical observations. Clinically the patient looks like a 3/10             When using our objective Pain Scale, levels between 6 and 10/10 are said to belong in an emergency room, as it progressively worsens from a 6/10, described as severely limiting, requiring emergency care not usually available at an outpatient pain management facility. At a 6/10 level, communication becomes difficult and requires great effort. Assistance to reach the emergency department may be required. Facial flushing and profuse sweating along with potentially dangerous increases in heart rate and blood  pressure will be evident. Effect on ADL: unable to climb stairs or stay on feet for very long Timing: Constant Modifying factors: medications, ice, reclining  [name] was last scheduled for an appointment on 04/30/2017 for medication management. During today's appointment we reviewed [name]'s chronic pain status, as well as her outpatient medication regimen.   Patient returns today for follow-up.  She notes increased functionality and ability to perform the activities of daily living after starting Percocet 5 mg twice daily.  On further discussion with the patient, she is also taking tramadol which is prescribed by her primary care physician and was prescribed in September prior to medication agreement which was made and December 2018.  I instructed the patient that she must discontinue the tramadol and that I will complete a urine drug screen at the next visit to ensure that she is off this medication.  I also reinforced her pain contract which states that all opioid medications for chronic pain should be coming from this clinic.  Patient endorsed understanding.  Patient continues to endorse axial low back and buttock pain that occasionally radiates into her anterior groin region.  The patient  reports that she does not use drugs. Her body mass index is 22.67 kg/m.  Further details on both, my assessment(s), as well as the proposed treatment plan, please see below.  Controlled Substance Pharmacotherapy Assessment REMS (Risk Evaluation and Mitigation Strategy)  Analgesic: Percocet 5 mg twice daily as needed, quantity 3-monthMME/day: Approximately 15 mg/day.  [name] 06/04/2017  9:12 AM  Signed Nursing Pain Medication Assessment:  Safety precautions to be maintained throughout the outpatient stay will include: orient to surroundings, keep  bed in low position, maintain call bell within reach at all times, provide assistance with transfer out of bed and ambulation.  Medication  Inspection Compliance: Pill count conducted under aseptic conditions, in front of the patient. Neither the pills nor the bottle was removed from the patient's sight at any time. Once count was completed pills were immediately returned to the patient in their original bottle.  Medication: Oxycodone/APAP Pill/Patch Count: 0 of 60 pills remain Pill/Patch Appearance: Markings consistent with prescribed medication Bottle Appearance: Standard pharmacy container. Clearly labeled. Filled Date: 61 / 58 / 2018 Last Medication intake:  Ran out of medicine more than 48 hours ago   Pharmacokinetics: Liberation and absorption (onset of action): WNL Distribution (time to peak effect): WNL Metabolism and excretion (duration of action): WNL         Pharmacodynamics: Desired effects: Analgesia: [name] reports >50% benefit. Functional ability: Patient reports that medication allows her to accomplish basic ADLs Clinically meaningful improvement in function (CMIF): Sustained CMIF goals met Perceived effectiveness: Described as relatively effective, allowing for increase in activities of daily living (ADL) Undesirable effects: Side-effects or Adverse reactions: None reported Monitoring: [place] PMP: Online review of the past 51-monthperiod conducted. Compliant with practice rules and regulations Last UDS on record: Summary  Date Value Ref Range Status  02/04/2017 FINAL  Final    Comment:    ==================================================================== TOXAS[facility]RE COMP DRUG ANALYSIS,UR ==================================================================== Test                             Result       Flag       Units Drug Present and Declared for Prescription Verification   Oxazepam                       >2703        EXPECTED   ng/mg creat   Temazepam                      >2703        EXPECTED   ng/mg creat    Oxazepam and temazepam are expected metabolites of diazepam.    Oxazepam is also an expected  metabolite of other benzodiazepine    drugs, including chlordiazepoxide, prazepam, clorazepate,    halazepam, and temazepam.  Oxazepam and temazepam are available    as scheduled prescription medications.   Acetaminophen                  PRESENT      EXPECTED   Metoprolol                     PRESENT      EXPECTED Drug Present not Declared for Prescription Verification   Hydrocodone                    2520         UNEXPECTED ng/mg creat   Hydromorphone                  247          UNEXPECTED ng/mg creat   Dihydrocodeine                 168          UNEXPECTED ng/mg creat   Norhydrocodone  2441         UNEXPECTED ng/mg creat    Sources of hydrocodone include scheduled prescription    medications. Hydromorphone, dihydrocodeine and norhydrocodone are    expected metabolites of hydrocodone. Hydromorphone and    dihydrocodeine are also available as scheduled prescription    medications.   Oxycodone                      1345         UNEXPECTED ng/mg creat   Oxymorphone                    1104         UNEXPECTED ng/mg creat   Noroxycodone                   3872         UNEXPECTED ng/mg creat   Noroxymorphone                 291          UNEXPECTED ng/mg creat    Sources of oxycodone are scheduled prescription medications.    Oxymorphone, noroxycodone, and noroxymorphone are expected    metabolites of oxycodone. Oxymorphone is also available as a    scheduled prescription medication.   Salicylate                     PRESENT      UNEXPECTED Drug Absent but Declared for Prescription Verification   Tramadol                       Not Detected UNEXPECTED ng/mg creat   Topiramate                     Not Detected UNEXPECTED   Diclofenac                     Not Detected UNEXPECTED    Diclofenac, as indicated in the declared medication list, is not    always detected even when used as directed. ==================================================================== Test                       Result    Flag   Units      Ref Range   Creatinine              74               mg/dL      >=20 ==================================================================== Declared Medications:  The flagging and interpretation on this report are based on the  following declared medications.  Unexpected results may arise from  inaccuracies in the declared medications.  **Note: The testing scope of this panel includes these medications:  Metoprolol (Toprol)  Temazepam (Restoril)  Topiramate (Topamax)  Tramadol (Ultram)  **Note: The testing scope of this panel does not include small to  moderate amounts of these reported medications:  Acetaminophen (Tylenol)  Diclofenac (Voltaren) ==================================================================== For clinical consultation, please call [phone]. ====================================================================    UDS interpretation: Compliant          Medication Assessment Form: Reviewed. Patient indicates being compliant with therapy Treatment compliance: Compliant Risk Assessment Profile: Aberrant behavior: See prior evaluations. None observed or detected today Comorbid factors increasing risk of overdose: See prior notes. No additional risks detected today Risk of substance use disorder (SUD): Low Opioid Risk Tool - 06/04/17  0908      Family History of Substance Abuse   Alcohol  Negative    Illegal Drugs  Negative    Rx Drugs  Negative      Personal History of Substance Abuse   Alcohol  Negative    Illegal Drugs  Negative    Rx Drugs  Negative      Age   Age between 82-45 years   No      Psychological Disease   Psychological Disease  Negative    Depression  Negative      Total Score   Opioid Risk Tool Scoring  0    Opioid Risk Interpretation  Low Risk      ORT Scoring interpretation table:  Score <3 = Low Risk for SUD  Score between 4-7 = Moderate Risk for SUD  Score >8 = High Risk for Opioid Abuse   Risk  Mitigation Strategies:  Patient Counseling: Covered Patient-Prescriber Agreement (PPA): Present and active  Notification to other healthcare providers: Done  Pharmacologic Plan: No change in therapy, at this time.           Patient will discontinue tramadol.  UDS at next visit to confirm discontinuation.  Laboratory Chemistry  Inflammation Markers (CRP: Acute Phase) (ESR: Chronic Phase) No results found for: CRP, ESRSEDRATE, LATICACIDVEN               Rheumatology Markers No results found for: RF, ANA, [facility], LYMEIGGIGMAB, [facility]              Renal Function Markers Lab Results  Component Value Date   BUN 9 03/24/2013   CREATININE 0.97 03/24/2013   GFRAA 82 (L) 03/24/2013   GFRNONAA 71 (L) 03/24/2013                 Hepatic Function Markers Lab Results  Component Value Date   AST 17 03/24/2013   ALT 8 03/24/2013   ALBUMIN 3.6 03/24/2013   ALKPHOS 90 03/24/2013   LIPASE 25 03/24/2013                 Electrolytes Lab Results  Component Value Date   NA 138 03/24/2013   K 4.3 03/24/2013   CL 105 03/24/2013   CALCIUM 9.6 03/24/2013                 Neuropathy Markers No results found for: VITAMINB12, FOLATE, HGBA1C, HIV               Bone Pathology Markers No results found for: VD25OH, VD125OH2TOT, QB1694HW3, UU8280KL4, 25OHVITD1, 25OHVITD2, 25OHVITD3, TESTOFREE, TESTOSTERONE               Coagulation Parameters Lab Results  Component Value Date   PLT 307 03/24/2013                 Cardiovascular Markers Lab Results  Component Value Date   HGB 13.4 03/24/2013   HCT 39.7 03/24/2013                 CA Markers No results found for: CEA, CA125, LABCA2               Note: Lab results reviewed.  Recent Diagnostic Imaging Results  DG C-Arm 1-60 Min-No Report Fluoroscopy was utilized by the requesting physician.  No radiographic  interpretation.   Complexity Note: Imaging results reviewed. Results shared with [name], using Layman's terms.  Meds   Current Outpatient Medications:  .  acetaminophen (TYLENOL) 500 MG tablet, Take 1,000 mg by mouth every 6 (six) hours as needed for mild pain, moderate pain or headache. , Disp: , Rfl:  .  metoprolol succinate (TOPROL-XL) 100 MG 24 hr tablet, Take 100 mg by mouth daily. Take with or immediately following a meal., Disp: , Rfl:  .  oxyCODONE-acetaminophen (PERCOCET/ROXICET) 5-325 MG tablet, Take 1 tablet by mouth 2 (two) times daily as needed for up to 14 days for severe pain., Disp: 30 tablet, Rfl: 0 .  RaNITidine HCl (ZANTAC PO), Take by mouth., Disp: , Rfl:  .  temazepam (RESTORIL) 30 MG capsule, Take by mouth., Disp: , Rfl:   ROS  Constitutional: Denies any fever or chills Gastrointestinal: No reported hemesis, hematochezia, vomiting, or acute GI distress Musculoskeletal: Denies any acute onset joint swelling, redness, loss of ROM, or weakness Neurological: No reported episodes of acute onset apraxia, aphasia, dysarthria, agnosia, amnesia, paralysis, loss of coordination, or loss of consciousness  Allergies  [name] is allergic to buprenorphine; cefaclor; gabapentin; levaquin [levofloxacin in d5w]; ibuprofen; levofloxacin; macrobid [nitrofurantoin monohyd macro]; naproxen; nitrofurantoin; and penicillins.  [facility]  Drug: [name]  reports that she does not use drugs. Alcohol:  reports that she does not drink alcohol. Tobacco:  reports that she has been smoking cigarettes.  She has a 20.00 pack-year smoking history. she has never used smokeless tobacco. Medical:  has a past medical history of Chronic back pain, Hypertension, and Migraine. Surgical: [name]  has a past surgical history that includes Abdominal hysterectomy; Cholecystectomy; and Hand surgery. Family: family history includes Cancer in her other; Diabetes in her other; Seizures in her other; Stroke in her other.  Constitutional Exam  General appearance: Well nourished, well developed, and well  hydrated. In no apparent acute distress Vitals:   06/04/17 0901  BP: (!) 151/64  [name]: 64  Resp: 16  Temp: 97.7 F (36.5 C)  TempSrc: Oral  SpO2: 100%  Weight: 120 lb (54.4 kg)  Height: _0  (1.549 m)   BMI Assessment: Estimated body mass index is 22.67 kg/m as calculated from the following:   Height as of this encounter: _1  (1.549 m).   Weight as of this encounter: 120 lb (54.4 kg).  BMI interpretation table: BMI level Category Range association with higher incidence of chronic pain  <18 kg/m2 Underweight   18.5-24.9 kg/m2 Ideal body weight   25-29.9 kg/m2 Overweight Increased incidence by 20%  30-34.9 kg/m2 Obese (Class I) Increased incidence by 68%  35-39.9 kg/m2 Severe obesity (Class II) Increased incidence by 136%  >40 kg/m2 Extreme obesity (Class III) Increased incidence by 254%   BMI Readings from Last 4 Encounters:  06/04/17 22.67 kg/m  04/30/17 22.67 kg/m  04/15/17 22.67 kg/m  02/04/17 22.67 kg/m   Wt Readings from Last 4 Encounters:  06/04/17 120 lb (54.4 kg)  04/30/17 120 lb (54.4 kg)  04/15/17 120 lb (54.4 kg)  02/04/17 120 lb (54.4 kg)  Psych/Mental status: Alert, oriented x 3 (person, place, & time)       Eyes: PERLA Respiratory: No evidence of acute respiratory distress  Cervical Spine Area Exam  Skin & Axial Inspection: No masses, redness, edema, swelling, or associated skin lesions Alignment: Symmetrical Functional ROM: Unrestricted ROM      Stability: No instability detected Muscle Tone/Strength: Functionally intact. No obvious neuro-muscular anomalies detected. Sensory (Neurological): Unimpaired Palpation: No palpable anomalies  Upper Extremity (UE) Exam    Side: Right upper extremity  Side: Left upper extremity  Skin & Extremity Inspection: Skin color, temperature, and hair growth are WNL. No peripheral edema or cyanosis. No masses, redness, swelling, asymmetry, or associated skin lesions. No contractures.  Skin & Extremity  Inspection: Skin color, temperature, and hair growth are WNL. No peripheral edema or cyanosis. No masses, redness, swelling, asymmetry, or associated skin lesions. No contractures.  Functional ROM: Unrestricted ROM          Functional ROM: Unrestricted ROM          Muscle Tone/Strength: Functionally intact. No obvious neuro-muscular anomalies detected.  Muscle Tone/Strength: Functionally intact. No obvious neuro-muscular anomalies detected.  Sensory (Neurological): Unimpaired          Sensory (Neurological): Unimpaired          Palpation: No palpable anomalies              Palpation: No palpable anomalies              Specialized Test(s): Deferred         Specialized Test(s): Deferred          Thoracic Spine Area Exam  Skin & Axial Inspection: No masses, redness, or swelling Alignment: Symmetrical Functional ROM: Unrestricted ROM Stability: No instability detected Muscle Tone/Strength: Functionally intact. No obvious neuro-muscular anomalies detected. Sensory (Neurological): Unimpaired Muscle strength & Tone: No palpable anomalies  Lumbar Spine Area Exam  Skin & Axial Inspection: No masses, redness, or swelling Alignment: Symmetrical Functional ROM: Unrestricted ROM      Stability: No instability detected Muscle Tone/Strength: Functionally intact. No obvious neuro-muscular anomalies detected. Sensory (Neurological): Unimpaired Palpation: No palpable anomalies       Provocative Tests: Lumbar Hyperextension and rotation test: Positive bilaterally for facet joint pain. Lumbar Lateral bending test: Positive due to pain. Patrick's Maneuver: Positive for bilateral S-I arthralgia              Gait & Posture Assessment  Ambulation: Unassisted Gait: Relatively normal for age and body habitus Posture: WNL   Lower Extremity Exam    Side: Right lower extremity  Side: Left lower extremity  Skin & Extremity Inspection: Skin color, temperature, and hair growth are WNL. No peripheral edema or  cyanosis. No masses, redness, swelling, asymmetry, or associated skin lesions. No contractures.  Skin & Extremity Inspection: Skin color, temperature, and hair growth are WNL. No peripheral edema or cyanosis. No masses, redness, swelling, asymmetry, or associated skin lesions. No contractures.  Functional ROM: Unrestricted ROM          Functional ROM: Unrestricted ROM          Muscle Tone/Strength: Functionally intact. No obvious neuro-muscular anomalies detected.  Muscle Tone/Strength: Functionally intact. No obvious neuro-muscular anomalies detected.  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 Diagnoses of Lumbar radiculopathy, Lumbar degenerative disc disease, and Chronic left-sided low back pain with left-sided sciatica were also pertinent to this visit.  Status Diagnosis  Having a Flare-up Persistent Persistent 1. 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 Lumbar radiculopathy   3. Lumbar degenerative disc disease   4. Chronic left-sided low back pain with left-sided sciatica      47 year old female who presents for medication management for axial low back pain and buttock pain that radiates into her left  leg.  This is secondary to lumbar degenerative disc disease and lumbar radiculopathy.  Patient is status post left epidural steroid injection which was not effective.  She is endorsing axial low back and buttock pain that occasionally radiates into her groin.  She does have a positive Patrick's bilaterally, left greater than right.  We discussed performing a sacroiliac joint injection under fluoroscopy and sedation to help out with her buttock and groin pain which could be stemming from SI joint arthritis.  In regards to medication management, she states that the Percocet is helping her  function and perform activities of daily living. On further discussion with the patient, she is also taking tramadol which is prescribed by her primary care physician and was prescribed in September prior to medication agreement which was made and December 2018.  She is taking 1-2 tablets daily.  I instructed the patient that she must discontinue the tramadol and that I will complete a urine drug screen at the next visit to ensure that she is off this medication.  I also reinforced her pain contract which states that all opioid medications for chronic pain should be coming from this clinic.  Patient endorsed understanding.  I will only prescribe her a 14-day allotment of Percocet.  Patient will discontinue her tramadol and bring in unused medication at next visit to be discarded in the clinic.  I will also complete a urine drug screen at the next visit to ensure that she has discontinued her tramadol.  At that time we can make adjustments on her Percocet.  We will perform her SI joint injection on the same day.  Plan: -Stop tramadol. -UDS at next visit.  Should be negative tramadol. -Percocet prescription as below for 14 days. -SI joint injection under fluoroscopy for SI joint arthritis.  Greater than 50% of the 25 minute(s) of face-to-face time spent with [name], was spent in counseling/coordination of care regarding: the appropriate use of the pain scale, the results,  the appropriate use of her medications, realistic expectations, the goals of pain management (increased in functionality) and the medication agreement.   Plan of Care  Pharmacotherapy (Medications Ordered): Meds ordered this encounter  Medications  . oxyCODONE-acetaminophen (PERCOCET/ROXICET) 5-325 MG tablet    Sig: Take 1 tablet by mouth 2 (two) times daily as needed for up to 14 days for severe pain.    Dispense:  30 tablet    Refill:  0    For chronic pain To last for 14 days from fill date   Lab-work, procedure(s),  and/or referral(s): Orders Placed This Encounter  Procedures  . SACROILIAC JOINT INJECTION   Opioid Analgesics:The patient was informed that there is no guarantee that shewould be a candidate for opioid analgesics. The decision will be made following CDC guidelines. This decision will be based on the results of diagnostic studies, as well as MonicaMueller's risk profile.Consider tramadol, Nucynta, Norco 5 mg twice a day when necessary  Membrane stabilizer:Gabapentin: Resulted in sedation, Lyrica: Mood changes and worsening depression. Topamax: not effective. No history of glaucoma or kidney stones.  Muscle relaxant:To be determined at a later time  NSAID:Medically contraindicatedallergy to NSAIDs result in rash and pruritus   Other analgesic(s):To be determined at a later time    Interventional management options:  PRN Procedures:   -Lumbar medial branch nerve blocks -Bilateral SI joint -Sphenopalatine ganglion nerve block for migraine -Botox injections for migraines     Provider-requested follow-up: Return in about 2 weeks (around 06/17/2017) for Medication  Management.  Future Appointments  Date Time Provider [place]  06/17/2017 11:00 AM Gillis Santa, MD [facility] None    Primary Care Physician: [facility] Location: [facility] Outpatient Pain Management Facility Note by: Gillis Santa, M.D Date: 06/04/2017; Time: 9:55 AM  Patient Instructions   1. Please stop Tramadol 2. UDS at next visit, should be negative for Tramadol 3. 2 week Rx for Percocet at current dose 4. At next visit, we can talk about readjustment of Tina  What are the risk, side effects and possible complications? Generally speaking, most procedures are safe.  However, with any procedure there are risks, side effects, and the possibility of complications.  The risks and complications are dependent upon the sites that are lesioned, or  the type of nerve block to be performed.  The closer the procedure is to the spine, the more serious the risks are.  Great care is taken when placing the radio frequency needles, block needles or lesioning probes, but sometimes complications can occur. 1. Infection: Any time there is an injection through the skin, there is a risk of infection.  This is why sterile conditions are used for these blocks.  There are four possible types of infection. 1. Localized skin infection. 2. Central Nervous System Infection-This can be in the form of Meningitis, which can be deadly. 3. Epidural Infections-This can be in the form of an epidural abscess, which can cause pressure inside of the spine, causing compression of the spinal cord with subsequent paralysis. This would require an emergency surgery to decompress, and there are no guarantees that the patient would recover from the paralysis. 4. Discitis-This is an infection of the intervertebral discs.  It occurs in about 1% of discography procedures.  It is difficult to treat and it may lead to surgery.        2. Pain: the needles have to go through skin and soft tissues, will cause soreness.       3. Damage to internal structures:  The nerves to be lesioned may be near blood vessels or    other nerves which can be potentially damaged.       4. Bleeding: Bleeding is more common if the patient is taking blood thinners such as  aspirin, Coumadin, Ticiid, Plavix, etc., or if he/she have some genetic predisposition  such as hemophilia. Bleeding into the spinal canal can cause compression of the spinal  cord with subsequent paralysis.  This would require an emergency surgery to  decompress and there are no guarantees that the patient would recover from the  paralysis.       5. Pneumothorax:  Puncturing of a lung is a possibility, every time a needle is introduced in  the area of the chest or upper back.  Pneumothorax refers to free air around the  collapsed lung(s),  inside of the thoracic cavity (chest cavity).  Another two possible  complications related to a similar event would include: Hemothorax and Chylothorax.   These are variations of the Pneumothorax, where instead of air around the collapsed  lung(s), you may have blood or chyle, respectively.       6. Spinal headaches: They may occur with any procedures in the area of the spine.       7. Persistent CSF (Cerebro-Spinal Fluid) leakage: This is a rare problem, but may occur  with prolonged intrathecal or epidural catheters either due to the formation of a fistulous  track or a  dural tear.       8. Nerve damage: By working so close to the spinal cord, there is always a possibility of  nerve damage, which could be as serious as a permanent spinal cord injury with  paralysis.       9. Death:  Although rare, severe deadly allergic reactions known as "Anaphylactic  reaction" can occur to any of the medications used.      10. Worsening of the symptoms:  We can always make thing worse.  What are the chances of something like this happening? Chances of any of this occuring are extremely low.  By statistics, you have more of a chance of getting killed in a motor vehicle accident: while driving to the hospital than any of the above occurring .  Nevertheless, you should be aware that they are possibilities.  In general, it is similar to taking a shower.  Everybody knows that you can slip, hit your head and get killed.  Does that mean that you should not shower again?  Nevertheless always keep in mind that statistics do not mean anything if you happen to be on the wrong side of them.  Even if a procedure has a 1 (one) in a 1,000,000 (million) chance of going wrong, it you happen to be that one..Also, keep in mind that by statistics, you have more of a chance of having something go wrong when taking medications.  Who should not have this procedure? If you are on a blood thinning medication (e.g. Coumadin, Plavix, see list  of "Blood Thinners"), or if you have an active infection going on, you should not have the procedure.  If you are taking any blood thinners, please inform your physician.  How should I prepare for this procedure?  Do not eat or drink anything at least six hours prior to the procedure.  Bring a driver with you .  It cannot be a taxi.  Come accompanied by an adult that can drive you back, and that is strong enough to help you if your legs get weak or numb from the local anesthetic.  Take all of your medicines the morning of the procedure with just enough water to swallow them.  If you have diabetes, make sure that you are scheduled to have your procedure done first thing in the morning, whenever possible.  If you have diabetes, take only half of your insulin dose and notify our nurse that you have done so as soon as you arrive at the clinic.  If you are diabetic, but only take blood sugar pills (oral hypoglycemic), then do not take them on the morning of your procedure.  You may take them after you have had the procedure.  Do not take aspirin or any aspirin-containing medications, at least eleven (11) days prior to the procedure.  They may prolong bleeding.  Wear loose fitting clothing that may be easy to take off and that you would not mind if it got stained with Betadine or blood.  Do not wear any jewelry or perfume  Remove any nail coloring.  It will interfere with some of our monitoring equipment.  NOTE: Remember that this is not meant to be interpreted as a complete list of all possible complications.  Unforeseen problems may occur.  BLOOD THINNERS The following drugs contain aspirin or other products, which can cause increased bleeding during surgery and should not be taken for 2 weeks prior to and 1 week after surgery.  If you should  need take something for relief of minor pain, you may take acetaminophen which is found in Tylenol,m Datril, Anacin-3 and Panadol. It is not blood  thinner. The products listed below are.  Do not take any of the products listed below in addition to any listed on your instruction sheet.  A.P.C or A.P.C with Codeine Codeine Phosphate Capsules #3 Ibuprofen Ridaura  ABC compound Congesprin Imuran rimadil  Advil Cope Indocin Robaxisal  Alka-Seltzer Effervescent Pain Reliever and Antacid Coricidin or Coricidin-D  Indomethacin Rufen  Alka-Seltzer plus Cold Medicine Cosprin Ketoprofen S-A-C Tablets  Anacin Analgesic Tablets or Capsules Coumadin Korlgesic Salflex  Anacin Extra Strength Analgesic tablets or capsules CP-2 Tablets Lanoril Salicylate  Anaprox Cuprimine Capsules Levenox Salocol  Anexsia-D Dalteparin Magan Salsalate  Anodynos Darvon compound Magnesium Salicylate Sine-off  Ansaid Dasin Capsules Magsal Sodium Salicylate  Anturane Depen Capsules Marnal Soma  APF Arthritis pain formula Dewitt's Pills Measurin Stanback  Argesic Dia-Gesic Meclofenamic Sulfinpyrazone  Arthritis Bayer Timed Release Aspirin Diclofenac Meclomen Sulindac  Arthritis pain formula Anacin Dicumarol Medipren Supac  Analgesic (Safety coated) Arthralgen Diffunasal Mefanamic Suprofen  Arthritis Strength Bufferin Dihydrocodeine Mepro Compound Suprol  Arthropan liquid Dopirydamole Methcarbomol with Aspirin Synalgos  ASA tablets/Enseals Disalcid Micrainin Tagament  Ascriptin Doan's Midol Talwin  Ascriptin A/D Dolene Mobidin Tanderil  Ascriptin Extra Strength Dolobid Moblgesic Ticlid  Ascriptin with Codeine Doloprin or Doloprin with Codeine Momentum Tolectin  Asperbuf Duoprin Mono-gesic Trendar  Aspergum Duradyne Motrin or Motrin IB Triminicin  Aspirin plain, buffered or enteric coated Durasal Myochrisine Trigesic  Aspirin Suppositories Easprin Nalfon Trillsate  Aspirin with Codeine Ecotrin Regular or Extra Strength Naprosyn Uracel  Atromid-S Efficin Naproxen Ursinus  Auranofin Capsules Elmiron Neocylate Vanquish  Axotal Emagrin Norgesic Verin  Azathioprine  Empirin or Empirin with Codeine Normiflo Vitamin E  Azolid Emprazil Nuprin Voltaren  Bayer Aspirin plain, buffered or children's or timed BC Tablets or powders Encaprin Orgaran Warfarin Sodium  Buff-a-Comp Enoxaparin Orudis Zorpin  Buff-a-Comp with Codeine Equegesic Os-Cal-Gesic   Buffaprin Excedrin plain, buffered or Extra Strength Oxalid   Bufferin Arthritis Strength Feldene Oxphenbutazone   Bufferin plain or Extra Strength Feldene Capsules Oxycodone with Aspirin   Bufferin with Codeine Fenoprofen Fenoprofen Pabalate or Pabalate-SF   Buffets II Flogesic Panagesic   Buffinol plain or Extra Strength Florinal or Florinal with Codeine Panwarfarin   Buf-Tabs Flurbiprofen Penicillamine   Butalbital Compound Four-way cold tablets Penicillin   Butazolidin Fragmin Pepto-Bismol   Carbenicillin Geminisyn Percodan   Carna Arthritis Reliever Geopen Persantine   Carprofen Gold's salt Persistin   Chloramphenicol Goody's Phenylbutazone   Chloromycetin Haltrain Piroxlcam   Clmetidine heparin Plaquenil   Cllnoril Hyco-pap Ponstel   Clofibrate Hydroxy chloroquine Propoxyphen         Before stopping any of these medications, be sure to consult the physician who ordered them.  Some, such as Coumadin (Warfarin) are ordered to prevent or treat serious conditions such as "deep thrombosis", "pumonary embolisms", and other heart problems.  The amount of time that you may need off of the medication may also vary with the medication and the reason for which you were taking it.  If you are taking any of these medications, please make sure you notify your pain physician before you undergo any procedures.         Sacroiliac (SI) Joint Injection Patient Information  Description: The sacroiliac joint connects the scrum (very low back and tailbone) to the ilium (a pelvic bone which also forms half of the hip joint).  Normally this joint  experiences very little motion.  When this joint becomes inflamed or  unstable low back and or hip and pelvis pain may result.  Injection of this joint with local anesthetics (numbing medicines) and steroids can provide diagnostic information and reduce pain.  This injection is performed with the aid of x-ray guidance into the tailbone area while you are lying on your stomach.   You may experience an electrical sensation down the leg while this is being done.  You may also experience numbness.  We also may ask if we are reproducing your normal pain during the injection.  Conditions which may be treated SI injection:   Low back, buttock, hip or leg pain  Preparation for the Injection:  1. Do not eat any solid food or dairy products within 8 hours of your appointment.  2. You may drink clear liquids up to 3 hours before appointment.  Clear liquids include water, black coffee, juice or soda.  No milk or cream please. 3. You may take your regular medications, including pain medications with a sip of water before your appointment.  Diabetics should hold regular insulin (if take separately) and take 1/2 normal NPH dose the morning of the procedure.  Carry some sugar containing items with you to your appointment. 4. A driver must accompany you and be prepared to drive you home after your procedure. 5. Bring all of your current medications with you. 6. An IV may be inserted and sedation may be given at the discretion of the physician. 7. A blood pressure cuff, EKG and other monitors will often be applied during the procedure.  Some patients may need to have extra oxygen administered for a short period.  8. You will be asked to provide medical information, including your allergies, prior to the procedure.  We must know immediately if you are taking blood thinners (like Coumadin/Warfarin) or if you are allergic to IV iodine contrast (dye).  We must know if you could possible be pregnant.  Possible side effects:   Bleeding from needle site  Infection (rare, may require  surgery)  Nerve injury (rare)  Numbness & tingling (temporary)  A brief convulsion or seizure  Light-headedness (temporary)  Pain at injection site (several days)  Decreased blood pressure (temporary)  Weakness in the leg (temporary)   Call if you experience:   New onset weakness or numbness of an extremity below the injection site that last more than 8 hours.  Hives or difficulty breathing ( go to the emergency room)  Inflammation or drainage at the injection site  Any new symptoms which are concerning to you  Please note:  Although the local anesthetic injected can often make your back/ hip/ buttock/ leg feel good for several hours after the injections, the pain will likely return.  It takes 3-7 days for steroids to work in the sacroiliac area.  You may not notice any pain relief for at least that one week.  If effective, we will often do a series of three injections spaced 3-6 weeks apart to maximally decrease your pain.  After the initial series, we generally will wait some months before a repeat injection of the same type.  If you have any questions, please call [phone] [facility]

## 2017-06-17 ENCOUNTER — Encounter: Payer: BLUE CROSS/BLUE SHIELD | Admitting: Student in an Organized Health Care Education/Training Program

## 2017-06-20 ENCOUNTER — Encounter: Payer: BLUE CROSS/BLUE SHIELD | Admitting: Student in an Organized Health Care Education/Training Program

## 2017-06-27 ENCOUNTER — Ambulatory Visit
Payer: BLUE CROSS/BLUE SHIELD | Attending: Student in an Organized Health Care Education/Training Program | Admitting: Student in an Organized Health Care Education/Training Program

## 2017-06-27 ENCOUNTER — Encounter: Payer: Self-pay | Admitting: Student in an Organized Health Care Education/Training Program

## 2017-06-27 ENCOUNTER — Other Ambulatory Visit: Payer: Self-pay

## 2017-06-27 VITALS — BP 158/71 | HR 68 | Temp 98.1°F | Resp 18 | Ht 61.0 in | Wt 120.0 lb

## 2017-06-27 DIAGNOSIS — M47898 Other spondylosis, sacral and sacrococcygeal region: Secondary | ICD-10-CM | POA: Diagnosis not present

## 2017-06-27 DIAGNOSIS — M461 Sacroiliitis, not elsewhere classified: Secondary | ICD-10-CM

## 2017-06-27 DIAGNOSIS — I10 Essential (primary) hypertension: Secondary | ICD-10-CM | POA: Diagnosis not present

## 2017-06-27 DIAGNOSIS — Z880 Allergy status to penicillin: Secondary | ICD-10-CM | POA: Diagnosis not present

## 2017-06-27 DIAGNOSIS — F17210 Nicotine dependence, cigarettes, uncomplicated: Secondary | ICD-10-CM | POA: Insufficient documentation

## 2017-06-27 DIAGNOSIS — M5136 Other intervertebral disc degeneration, lumbar region: Secondary | ICD-10-CM | POA: Diagnosis not present

## 2017-06-27 DIAGNOSIS — Z886 Allergy status to analgesic agent status: Secondary | ICD-10-CM | POA: Diagnosis not present

## 2017-06-27 DIAGNOSIS — Z79899 Other long term (current) drug therapy: Secondary | ICD-10-CM | POA: Diagnosis not present

## 2017-06-27 DIAGNOSIS — Z888 Allergy status to other drugs, medicaments and biological substances status: Secondary | ICD-10-CM | POA: Diagnosis not present

## 2017-06-27 DIAGNOSIS — Z760 Encounter for issue of repeat prescription: Secondary | ICD-10-CM | POA: Insufficient documentation

## 2017-06-27 DIAGNOSIS — G43709 Chronic migraine without aura, not intractable, without status migrainosus: Secondary | ICD-10-CM | POA: Diagnosis not present

## 2017-06-27 DIAGNOSIS — M4698 Unspecified inflammatory spondylopathy, sacral and sacrococcygeal region: Secondary | ICD-10-CM

## 2017-06-27 DIAGNOSIS — M545 Low back pain: Secondary | ICD-10-CM | POA: Diagnosis present

## 2017-06-27 DIAGNOSIS — M51369 Other intervertebral disc degeneration, lumbar region without mention of lumbar back pain or lower extremity pain: Secondary | ICD-10-CM

## 2017-06-27 DIAGNOSIS — Z90710 Acquired absence of both cervix and uterus: Secondary | ICD-10-CM | POA: Diagnosis not present

## 2017-06-27 DIAGNOSIS — Z881 Allergy status to other antibiotic agents status: Secondary | ICD-10-CM | POA: Insufficient documentation

## 2017-06-27 DIAGNOSIS — Z9049 Acquired absence of other specified parts of digestive tract: Secondary | ICD-10-CM | POA: Insufficient documentation

## 2017-06-27 DIAGNOSIS — M5116 Intervertebral disc disorders with radiculopathy, lumbar region: Secondary | ICD-10-CM | POA: Insufficient documentation

## 2017-06-27 DIAGNOSIS — G894 Chronic pain syndrome: Secondary | ICD-10-CM | POA: Diagnosis not present

## 2017-06-27 DIAGNOSIS — M47818 Spondylosis without myelopathy or radiculopathy, sacral and sacrococcygeal region: Secondary | ICD-10-CM

## 2017-06-27 DIAGNOSIS — Z5181 Encounter for therapeutic drug level monitoring: Secondary | ICD-10-CM | POA: Insufficient documentation

## 2017-06-27 MED ORDER — OXYCODONE-ACETAMINOPHEN 5-325 MG PO TABS: 1.0000 | ORAL_TABLET | Freq: Two times a day (BID) | ORAL | 0 refills | Status: DC | PRN

## 2017-06-27 NOTE — Patient Instructions (Signed)
You were given one prescription for Percocet today.  Sacroiliac (SI) Joint Injection Patient Information  Description: The sacroiliac joint connects the scrum (very low back and tailbone) to the ilium (a pelvic bone which also forms half of the hip joint).  Normally this joint experiences very little motion.  When this joint becomes inflamed or unstable low back and or hip and pelvis pain may result.  Injection of this joint with local anesthetics (numbing medicines) and steroids can provide diagnostic information and reduce pain.  This injection is performed with the aid of x-ray guidance into the tailbone area while you are lying on your stomach.   You may experience an electrical sensation down the leg while this is being done.  You may also experience numbness.  We also may ask if we are reproducing your normal pain during the injection.  Conditions which may be treated SI injection:   Low back, buttock, hip or leg pain  Preparation for the Injection:  1. Do not eat any solid food or dairy products within 8 hours of your appointment.  2. You may drink clear liquids up to 3 hours before appointment.  Clear liquids include water, black coffee, juice or soda.  No milk or cream please. 3. You may take your regular medications, including pain medications with a sip of water before your appointment.  Diabetics should hold regular insulin (if take separately) and take 1/2 normal NPH dose the morning of the procedure.  Carry some sugar containing items with you to your appointment. 4. A driver must accompany you and be prepared to drive you home after your procedure. 5. Bring all of your current medications with you. 6. An IV may be inserted and sedation may be given at the discretion of the physician. 7. A blood pressure cuff, EKG and other monitors will often be applied during the procedure.  Some patients may need to have extra oxygen administered for a short period.  8. You will be asked to  provide medical information, including your allergies, prior to the procedure.  We must know immediately if you are taking blood thinners (like Coumadin/Warfarin) or if you are allergic to IV iodine contrast (dye).  We must know if you could possible be pregnant.  Possible side effects:   Bleeding from needle site  Infection (rare, may require surgery)  Nerve injury (rare)  Numbness & tingling (temporary)  A brief convulsion or seizure  Light-headedness (temporary)  Pain at injection site (several days)  Decreased blood pressure (temporary)  Weakness in the leg (temporary)   Call if you experience:   New onset weakness or numbness of an extremity below the injection site that last more than 8 hours.  Hives or difficulty breathing ( go to the emergency room)  Inflammation or drainage at the injection site  Any new symptoms which are concerning to you  Please note:  Although the local anesthetic injected can often make your back/ hip/ buttock/ leg feel good for several hours after the injections, the pain will likely return.  It takes 3-7 days for steroids to work in the sacroiliac area.  You may not notice any pain relief for at least that one week.  If effective, we will often do a series of three injections spaced 3-6 weeks apart to maximally decrease your pain.  After the initial series, we generally will wait some months before a repeat injection of the same type.  If you have any questions, please call [phone] [place]  [facility]

## 2017-06-27 NOTE — Progress Notes (Signed)
Patient's Name: [name]  MRN: [id number]  Referring Provider: [facility]  DOB: [birth year]  PCP: [facility]  DOS: 06/27/2017  Note by: Gillis Santa, MD  Service setting: Ambulatory outpatient  Specialty: Interventional Pain Management  Location: [facility] (AMB) Pain Management Facility    Patient type: Established   Primary Reason(s) for Visit: Encounter for prescription drug management. (Level of risk: moderate)  CC: Back Pain (low) and Leg Pain (left)  HPI  [name] is a 47 y.o. year old, female patient, who comes today for a medication management evaluation. She has Lumbar radiculopathy; Lumbar degenerative disc disease; Lumbar spondylosis; Chronic low back pain with bilateral sciatica; Chronic pain syndrome; Lumbar sprain; and Chronic migraine without aura without status migrainosus, not intractable on their problem list. Her primarily concern today is the Back Pain (low) and Leg Pain (left)  Pain Assessment: Location: Lower Back Radiating: left leg Onset: More than a month ago Duration: Chronic pain Quality: Stabbing, Pounding, Constant Severity: 9 /10 (self-reported pain score)  Note: Reported level is inconsistent with clinical observations.                         When using our objective Pain Scale, levels between 6 and 10/10 are said to belong in an emergency room, as it progressively worsens from a 6/10, described as severely limiting, requiring emergency care not usually available at an outpatient pain management facility. At a 6/10 level, communication becomes difficult and requires great effort. Assistance to reach the emergency department may be required. Facial flushing and profuse sweating along with potentially dangerous increases in heart rate and blood pressure will be evident. Effect on ADL:   Timing: Constant Modifying factors: medications, ice, positioning  [name] was last scheduled for an appointment on 06/04/2017 for  medication management. During today's appointment we reviewed [name]'s chronic pain status, as well as her outpatient medication regimen.  Patient returns today for follow-up.  She states that she has discontinued her tramadol.  We will obtain repeat urine drug screen today.  The patient  reports that she does not use drugs. Her body mass index is 22.67 kg/m.  Further details on both, my assessment(s), as well as the proposed treatment plan, please see below.  Controlled Substance Pharmacotherapy Assessment REMS (Risk Evaluation and Mitigation Strategy)  Analgesic: Percocet 5 mg BID prn MME/day: 10 mg/day.  Hart Rochester, RN  06/27/2017 10:05 AM  Sign at close encounter Nursing Pain Medication Assessment:  Safety precautions to be maintained throughout the outpatient stay will include: orient to surroundings, keep bed in low position, maintain call bell within reach at all times, provide assistance with transfer out of bed and ambulation.  Medication Inspection Compliance: Ms. Lutter did not comply with our request to bring her pills to be counted. She was reminded that bringing the medication bottles, even when empty, is a requirement.  Medication: None brought in. Pill/Patch Count: None available to be counted. Bottle Appearance: No container available. Did not bring bottle(s) to appointment. Filled Date: N/A Last Medication intake:  Today   Pharmacokinetics: Liberation and absorption (onset of action): WNL Distribution (time to peak effect): WNL Metabolism and excretion (duration of action): WNL         Pharmacodynamics: Desired effects: Analgesia: [name] reports >50% benefit. Functional ability: Patient reports that medication allows her to accomplish basic ADLs Clinically meaningful improvement in function (CMIF): Sustained CMIF goals met Perceived  effectiveness: Described as relatively effective, allowing for increase in activities of daily living (ADL) Undesirable  effects: Side-effects or Adverse reactions: None reported Monitoring:  PMP: Online review of the past 71-monthperiod conducted. Compliant with practice rules and regulations Last UDS on record: Summary  Date Value Ref Range Status  02/04/2017 FINAL  Final    Comment:    ==================================================================== TOXASSURE COMP DRUG ANALYSIS,UR ==================================================================== Test                             Result       Flag       Units Drug Present and Declared for Prescription Verification   Oxazepam                       >2703        EXPECTED   ng/mg creat   Temazepam                      >2703        EXPECTED   ng/mg creat    Oxazepam and temazepam are expected metabolites of diazepam.    Oxazepam is also an expected metabolite of other benzodiazepine    drugs, including chlordiazepoxide, prazepam, clorazepate,    halazepam, and temazepam.  Oxazepam and temazepam are available    as scheduled prescription medications.   Acetaminophen                  PRESENT      EXPECTED   Metoprolol                     PRESENT      EXPECTED Drug Present not Declared for Prescription Verification   Hydrocodone                    2520         UNEXPECTED ng/mg creat   Hydromorphone                  247          UNEXPECTED ng/mg creat   Dihydrocodeine                 168          UNEXPECTED ng/mg creat   Norhydrocodone                 2441         UNEXPECTED ng/mg creat    Sources of hydrocodone include scheduled prescription    medications. Hydromorphone, dihydrocodeine and norhydrocodone are    expected metabolites of hydrocodone. Hydromorphone and    dihydrocodeine are also available as scheduled prescription    medications.   Oxycodone                      1345         UNEXPECTED ng/mg creat   Oxymorphone                    1104         UNEXPECTED ng/mg creat   Noroxycodone                   3872         UNEXPECTED ng/mg  creat   Noroxymorphone  291          UNEXPECTED ng/mg creat    Sources of oxycodone are scheduled prescription medications.    Oxymorphone, noroxycodone, and noroxymorphone are expected    metabolites of oxycodone. Oxymorphone is also available as a    scheduled prescription medication.   Salicylate                     PRESENT      UNEXPECTED Drug Absent but Declared for Prescription Verification   Tramadol                       Not Detected UNEXPECTED ng/mg creat   Topiramate                     Not Detected UNEXPECTED   Diclofenac                     Not Detected UNEXPECTED    Diclofenac, as indicated in the declared medication list, is not    always detected even when used as directed. ==================================================================== Test                      Result    Flag   Units      Ref Range   Creatinine              74               mg/dL      >=20 ==================================================================== Declared Medications:  The flagging and interpretation on this report are based on the  following declared medications.  Unexpected results may arise from  inaccuracies in the declared medications.  **Note: The testing scope of this panel includes these medications:  Metoprolol (Toprol)  Temazepam (Restoril)  Topiramate (Topamax)  Tramadol (Ultram)  **Note: The testing scope of this panel does not include small to  moderate amounts of these reported medications:  Acetaminophen (Tylenol)  Diclofenac (Voltaren) ==================================================================== For clinical consultation, please call [phone]. ====================================================================    UDS interpretation: Unexpected findings not considered significantly abnormal Will repeat UDS today.  Patient has stopped tramadol.  Should be negative. Medication Assessment Form: Reviewed. Patient indicates being compliant  with therapy Treatment compliance: Compliant Risk Assessment Profile: Aberrant behavior: See prior evaluations. None observed or detected today Comorbid factors increasing risk of overdose: See prior notes. No additional risks detected today Risk of substance use disorder (SUD): Low Opioid Risk Tool - 06/04/17 0908      Family History of Substance Abuse   Alcohol  Negative    Illegal Drugs  Negative    Rx Drugs  Negative      Personal History of Substance Abuse   Alcohol  Negative    Illegal Drugs  Negative    Rx Drugs  Negative      Age   Age between 71-45 years   No      Psychological Disease   Psychological Disease  Negative    Depression  Negative      Total Score   Opioid Risk Tool Scoring  0    Opioid Risk Interpretation  Low Risk      ORT Scoring interpretation table:  Score <3 = Low Risk for SUD  Score between 4-7 = Moderate Risk for SUD  Score >8 = High Risk for Opioid Abuse   Risk Mitigation Strategies:  Patient Counseling: Covered Patient-Prescriber Agreement (PPA): Present and  active  Notification to other healthcare providers: Done  Pharmacologic Plan: No change in therapy, at this time.             Laboratory Chemistry  Inflammation Markers (CRP: Acute Phase) (ESR: Chronic Phase) No results found for: CRP, ESRSEDRATE, LATICACIDVEN                       Rheumatology Markers No results found for: RF, ANA, [facility], LYMEIGGIGMAB, [facility]              Renal Function Markers Lab Results  Component Value Date   BUN 9 03/24/2013   CREATININE 0.97 03/24/2013   GFRAA 82 (L) 03/24/2013   GFRNONAA 71 (L) 03/24/2013                 Hepatic Function Markers Lab Results  Component Value Date   AST 17 03/24/2013   ALT 8 03/24/2013   ALBUMIN 3.6 03/24/2013   ALKPHOS 90 03/24/2013   LIPASE 25 03/24/2013                 Electrolytes Lab Results  Component Value Date   NA 138 03/24/2013   K 4.3 03/24/2013   CL 105 03/24/2013   CALCIUM  9.6 03/24/2013                        Neuropathy Markers No results found for: VITAMINB12, FOLATE, HGBA1C, HIV               Bone Pathology Markers No results found for: VD25OH, VD125OH2TOT, FX9024OX7, DZ3299ME2, 25OHVITD1, 25OHVITD2, 25OHVITD3, TESTOFREE, TESTOSTERONE                       Coagulation Parameters Lab Results  Component Value Date   PLT 307 03/24/2013                 Cardiovascular Markers Lab Results  Component Value Date   HGB 13.4 03/24/2013   HCT 39.7 03/24/2013                 CA Markers No results found for: CEA, CA125, LABCA2               Note: Lab results reviewed.  Recent Diagnostic Imaging Results  DG C-Arm 1-60 Min-No Report Fluoroscopy was utilized by the requesting physician.  No radiographic  interpretation.   Complexity Note: Imaging results reviewed. Results shared with Ms. Sharen Counter, using Layman's terms.                         Meds   Current Outpatient Medications:  .  acetaminophen (TYLENOL) 500 MG tablet, Take 1,000 mg by mouth every 6 (six) hours as needed for mild pain, moderate pain or headache. , Disp: , Rfl:  .  metoprolol succinate (TOPROL-XL) 100 MG 24 hr tablet, Take 100 mg by mouth daily. Take with or immediately following a meal., Disp: , Rfl:  .  oxyCODONE-acetaminophen (PERCOCET/ROXICET) 5-325 MG tablet, Take 1 tablet by mouth 2 (two) times daily as needed for severe pain. For chronic pain. To last for 30 days from fill date., Disp: 60 tablet, Rfl: 0 .  RaNITidine HCl (ZANTAC PO), Take by mouth., Disp: , Rfl:  .  temazepam (RESTORIL) 30 MG capsule, Take by mouth., Disp: , Rfl:   ROS  Constitutional: Denies any fever or chills Gastrointestinal: No reported hemesis, hematochezia, vomiting,  or acute GI distress Musculoskeletal: Denies any acute onset joint swelling, redness, loss of ROM, or weakness Neurological: No reported episodes of acute onset apraxia, aphasia, dysarthria, agnosia, amnesia, paralysis, loss of  coordination, or loss of consciousness  Allergies  Ms. Stenner is allergic to buprenorphine; cefaclor; gabapentin; levaquin [levofloxacin in d5w]; ibuprofen; levofloxacin; macrobid [nitrofurantoin monohyd macro]; naproxen; nitrofurantoin; and penicillins.  [place]  Drug: Ms. Hertzog  reports that she does not use drugs. Alcohol:  reports that she does not drink alcohol. Tobacco:  reports that she has been smoking cigarettes.  She has a 20.00 pack-year smoking history. she has never used smokeless tobacco. Medical:  has a past medical history of Chronic back pain, Hypertension, and Migraine. Surgical: Ms. Paske  has a past surgical history that includes Abdominal hysterectomy; Cholecystectomy; and Hand surgery. Family: family history includes Cancer in her other; Diabetes in her other; Seizures in her other; Stroke in her other.  Constitutional Exam  General appearance: Well nourished, well developed, and well hydrated. In no apparent acute distress Vitals:   06/27/17 1001  BP: (!) 158/71  Pulse: 68  Resp: 18  Temp: 98.1 F (36.7 C)  TempSrc: Oral  SpO2: 99%  Weight: 120 lb (54.4 kg)  Height: 5' 1" (1.549 m)   BMI Assessment: Estimated body mass index is 22.67 kg/m as calculated from the following:   Height as of this encounter: 5' 1" (1.549 m).   Weight as of this encounter: 120 lb (54.4 kg).  BMI interpretation table: BMI level Category Range association with higher incidence of chronic pain  <18 kg/m2 Underweight   18.5-24.9 kg/m2 Ideal body weight   25-29.9 kg/m2 Overweight Increased incidence by 20%  30-34.9 kg/m2 Obese (Class I) Increased incidence by 68%  35-39.9 kg/m2 Severe obesity (Class II) Increased incidence by 136%  >40 kg/m2 Extreme obesity (Class III) Increased incidence by 254%   BMI Readings from Last 4 Encounters:  06/27/17 22.67 kg/m  06/04/17 22.67 kg/m  04/30/17 22.67 kg/m  04/15/17 22.67 kg/m   Wt Readings from Last 4 Encounters:  06/27/17 120 lb  (54.4 kg)  06/04/17 120 lb (54.4 kg)  04/30/17 120 lb (54.4 kg)  04/15/17 120 lb (54.4 kg)  Psych/Mental status: Alert, oriented x 3 (person, place, & time)       Eyes: PERLA Respiratory: No evidence of acute respiratory distress  Cervical Spine Area Exam  Skin & Axial Inspection: No masses, redness, edema, swelling, or associated skin lesions Alignment: Symmetrical Functional ROM: Unrestricted ROM      Stability: No instability detected Muscle Tone/Strength: Functionally intact. No obvious neuro-muscular anomalies detected. Sensory (Neurological): Unimpaired Palpation: No palpable anomalies              Upper Extremity (UE) Exam    Side: Right upper extremity  Side: Left upper extremity  Skin & Extremity Inspection: Skin color, temperature, and hair growth are WNL. No peripheral edema or cyanosis. No masses, redness, swelling, asymmetry, or associated skin lesions. No contractures.  Skin & Extremity Inspection: Skin color, temperature, and hair growth are WNL. No peripheral edema or cyanosis. No masses, redness, swelling, asymmetry, or associated skin lesions. No contractures.  Functional ROM: Unrestricted ROM          Functional ROM: Unrestricted ROM          Muscle Tone/Strength: Functionally intact. No obvious neuro-muscular anomalies detected.  Muscle Tone/Strength: Functionally intact. No obvious neuro-muscular anomalies detected.  Sensory (Neurological): Unimpaired  Sensory (Neurological): Unimpaired          Palpation: No palpable anomalies              Palpation: No palpable anomalies              Specialized Test(s): Deferred         Specialized Test(s): Deferred          Thoracic Spine Area Exam  Skin & Axial Inspection: No masses, redness, or swelling Alignment: Symmetrical Functional ROM: Unrestricted ROM Stability: No instability detected Muscle Tone/Strength: Functionally intact. No obvious neuro-muscular anomalies detected. Sensory (Neurological):  Unimpaired Muscle strength & Tone: No palpable anomalies  Lumbar Spine Area Exam  Skin & Axial Inspection: No masses, redness, or swelling Alignment: Symmetrical Functional ROM: Unrestricted ROM      Stability: No instability detected Muscle Tone/Strength: Functionally intact. No obvious neuro-muscular anomalies detected. Sensory (Neurological): Unimpaired Palpation: No palpable anomalies       Provocative Tests: Lumbar Hyperextension and rotation test: Positive bilaterally for facet joint pain. Lumbar Lateral bending test: evaluation deferred today       Patrick's Maneuver: Positive for bilateral S-I arthralgia              Gait & Posture Assessment  Ambulation: Unassisted Gait: Relatively normal for age and body habitus Posture: WNL   Lower Extremity Exam    Side: Right lower extremity  Side: Left lower extremity  Skin & Extremity Inspection: Skin color, temperature, and hair growth are WNL. No peripheral edema or cyanosis. No masses, redness, swelling, asymmetry, or associated skin lesions. No contractures.  Skin & Extremity Inspection: Skin color, temperature, and hair growth are WNL. No peripheral edema or cyanosis. No masses, redness, swelling, asymmetry, or associated skin lesions. No contractures.  Functional ROM: Unrestricted ROM          Functional ROM: Unrestricted ROM          Muscle Tone/Strength: Functionally intact. No obvious neuro-muscular anomalies detected.  Muscle Tone/Strength: Functionally intact. No obvious neuro-muscular anomalies detected.  Sensory (Neurological): Unimpaired  Sensory (Neurological): Unimpaired  Palpation: No palpable anomalies  Palpation: No palpable anomalies   Assessment  Primary Diagnosis & Pertinent Problem List: The primary encounter diagnosis was SI joint arthritis ([place]). Diagnoses of Lumbar degenerative disc disease, Chronic pain syndrome, and Chronic migraine without aura without status migrainosus, not intractable were also pertinent  to this visit.  Status Diagnosis  Having a Flare-up Persistent Persistent 1. SI joint arthritis ([place])   2. Lumbar degenerative disc disease   3. Chronic pain syndrome   4. Chronic migraine without aura without status migrainosus, not intractable     General Recommendations: The pain condition that the patient suffers from is best treated with a multidisciplinary approach that involves an increase in physical activity to prevent de-conditioning and worsening of the pain cycle, as well as psychological counseling (formal and/or informal) to address the co-morbid psychological affects of pain. Treatment will often involve judicious use of pain medications and interventional procedures to decrease the pain, allowing the patient to participate in the physical activity that will ultimately produce long-lasting pain reductions. The goal of the multidisciplinary approach is to return the patient to a higher level of overall function and to restore their ability to perform activities of daily living.  47 year old female who presents with axial low back and buttock pain that radiates into her groin secondary to SI joint arthritis.  Patient also has axial low back pain secondary to lumbar degenerative disc  disease.  She is here for medication refill.  I will complete urine drug screen today which should be negative for tramadol.  Patient states that she has stopped this medication.  UDS should only be positive for oxycodone and its metabolites.  We also discussed the risks and benefits of bilateral SI joint injections for SI joint arthritis.  Questions and concerns were addressed.  Patient would like to proceed.  Plan: -Percocet 5 mg twice daily as needed, quantity 33-month-Bilateral SI joint injection for SI joint arthritis under fluoroscopy. -UDS today.  Should be positive for oxycodone, negative for tramadol.  Plan of Care  Pharmacotherapy (Medications Ordered): Meds ordered this encounter   Medications  . oxyCODONE-acetaminophen (PERCOCET/ROXICET) 5-325 MG tablet    Sig: Take 1 tablet by mouth 2 (two) times daily as needed for severe pain. For chronic pain. To last for 30 days from fill date.    Dispense:  60 tablet    Refill:  0   Lab-work, procedure(s), and/or referral(s): Orders Placed This Encounter  Procedures  . SACROILIAC JOINT INJECTION  . ToxASSURE Select 13 (MW), Urine   Time Note: Greater than 50% of the 25 minute(s) of face-to-face time spent with Ms. PStange was spent in counseling/coordination of care regarding: the treatment plan, treatment alternatives, the risks and possible complications of proposed treatment, medication side effects, going over the informed consent, realistic expectations, the goals of pain management (increased in functionality) and the medication agreement. Provider-requested follow-up: Return in about 3 weeks (around 07/18/2017) for Medication Management, Procedure.  Future Appointments  Date Time Provider [place] 07/17/2017 10:15 AM [facility]illis [facility]    [facility] Note by: BGillis Santa M.D Date: 06/27/2017; Time: 1:00 PM  Patient Instructions  You were given one prescription for Percocet today.  Sacroiliac (SI) Joint Injection Patient Information  Description: The sacroiliac joint connects the scrum (very low back and tailbone) to the ilium (a pelvic bone which also forms half of the hip joint).  Normally this joint experiences very little motion.  When this joint becomes inflamed or unstable low back and or hip and pelvis pain may result.  Injection of this joint with local anesthetics (numbing medicines) and steroids can provide diagnostic information and reduce pain.  This injection is performed with the aid of x-ray guidance into the tailbone area while you are lying on your stomach.   You may experience  an electrical sensation down the leg while this is being done.  You may also experience numbness.  We also may ask if we are reproducing your normal pain during the injection.  Conditions which may be treated SI injection:   Low back, buttock, hip or leg pain  Preparation for the Injection:  1. Do not eat any solid food or dairy products within 8 hours of your appointment.  2. You may drink clear liquids up to 3 hours before appointment.  Clear liquids include water, black coffee, juice or soda.  No milk or cream please. 3. You may take your regular medications, including pain medications with a sip of water before your appointment.  Diabetics should hold regular insulin (if take separately) and take 1/2 normal NPH dose the morning of the procedure.  Carry some sugar containing items with you to your appointment. 4. A driver must accompany you and be prepared to drive you home after your procedure. 5. Bring all of your current  medications with you. 6. An IV may be inserted and sedation may be given at the discretion of the physician. 7. A blood pressure cuff, EKG and other monitors will often be applied during the procedure.  Some patients may need to have extra oxygen administered for a short period.  8. You will be asked to provide medical information, including your allergies, prior to the procedure.  We must know immediately if you are taking blood thinners (like Coumadin/Warfarin) or if you are allergic to IV iodine contrast (dye).  We must know if you could possible be pregnant.  Possible side effects:   Bleeding from needle site  Infection (rare, may require surgery)  Nerve injury (rare)  Numbness & tingling (temporary)  A brief convulsion or seizure  Light-headedness (temporary)  Pain at injection site (several days)  Decreased blood pressure (temporary)  Weakness in the leg (temporary)   Call if you experience:   New onset weakness or numbness of an extremity below  the injection site that last more than 8 hours.  Hives or difficulty breathing ( go to the emergency room)  Inflammation or drainage at the injection site  Any new symptoms which are concerning to you  Please note:  Although the local anesthetic injected can often make your back/ hip/ buttock/ leg feel good for several hours after the injections, the pain will likely return.  It takes 3-7 days for steroids to work in the sacroiliac area.  You may not notice any pain relief for at least that one week.  If effective, we will often do a series of three injections spaced 3-6 weeks apart to maximally decrease your pain.  After the initial series, we generally will wait some months before a repeat injection of the same type.  If you have any questions, please call [phone] [facility]

## 2017-06-27 NOTE — Progress Notes (Signed)
Nursing Pain Medication Assessment:  Safety precautions to be maintained throughout the outpatient stay will include: orient to surroundings, keep bed in low position, maintain call bell within reach at all times, provide assistance with transfer out of bed and ambulation.  Medication Inspection Compliance: Ms. Suen did not comply with our request to bring her pills to be counted. She was reminded that bringing the medication bottles, even when empty, is a requirement.  Medication: None brought in. Pill/Patch Count: None available to be counted. Bottle Appearance: No container available. Did not bring bottle(s) to appointment. Filled Date: N/A Last Medication intake:  Today 

## 2017-07-04 LAB — TOXASSURE SELECT 13 (MW), URINE

## 2017-07-17 ENCOUNTER — Ambulatory Visit: Payer: BLUE CROSS/BLUE SHIELD | Admitting: Student in an Organized Health Care Education/Training Program

## 2017-07-19 ENCOUNTER — Emergency Department (HOSPITAL_COMMUNITY)
Admission: EM | Admit: 2017-07-19 | Discharge: 2017-07-19 | Disposition: A | Discharge status: Home / Self Care | Payer: BLUE CROSS/BLUE SHIELD | Attending: Emergency Medicine | Admitting: Emergency Medicine

## 2017-07-19 ENCOUNTER — Emergency Department (HOSPITAL_COMMUNITY): Payer: BLUE CROSS/BLUE SHIELD

## 2017-07-19 ENCOUNTER — Encounter (HOSPITAL_COMMUNITY): Payer: Self-pay | Admitting: Cardiology

## 2017-07-19 DIAGNOSIS — R112 Nausea with vomiting, unspecified: Secondary | ICD-10-CM

## 2017-07-19 DIAGNOSIS — F17210 Nicotine dependence, cigarettes, uncomplicated: Secondary | ICD-10-CM | POA: Diagnosis not present

## 2017-07-19 DIAGNOSIS — I10 Essential (primary) hypertension: Secondary | ICD-10-CM | POA: Diagnosis not present

## 2017-07-19 DIAGNOSIS — R509 Fever, unspecified: Secondary | ICD-10-CM | POA: Diagnosis present

## 2017-07-19 DIAGNOSIS — E860 Dehydration: Secondary | ICD-10-CM | POA: Insufficient documentation

## 2017-07-19 DIAGNOSIS — J181 Lobar pneumonia, unspecified organism: Secondary | ICD-10-CM | POA: Diagnosis not present

## 2017-07-19 DIAGNOSIS — J111 Influenza due to unidentified influenza virus with other respiratory manifestations: Secondary | ICD-10-CM | POA: Insufficient documentation

## 2017-07-19 DIAGNOSIS — Z79899 Other long term (current) drug therapy: Secondary | ICD-10-CM | POA: Diagnosis not present

## 2017-07-19 DIAGNOSIS — J189 Pneumonia, unspecified organism: Secondary | ICD-10-CM

## 2017-07-19 DIAGNOSIS — G43009 Migraine without aura, not intractable, without status migrainosus: Secondary | ICD-10-CM | POA: Diagnosis not present

## 2017-07-19 LAB — BASIC METABOLIC PANEL
Anion gap: 14 (ref 5–15)
BUN: 11 mg/dL (ref 6–20)
CO2: 19 mmol/L — ABNORMAL LOW (ref 22–32)
Calcium: 8 mg/dL — ABNORMAL LOW (ref 8.9–10.3)
Chloride: 105 mmol/L (ref 101–111)
Creatinine, Ser: 0.83 mg/dL (ref 0.44–1.00)
GFR calc Af Amer: >60 mL/min (ref >60)
GFR calc non Af Amer: >60 mL/min (ref >60)
Glucose, Bld: 117 mg/dL — ABNORMAL HIGH (ref 65–99)
Potassium: 3.1 mmol/L — ABNORMAL LOW (ref 3.5–5.1)
Sodium: 138 mmol/L (ref 135–145)

## 2017-07-19 LAB — CBC WITH DIFFERENTIAL/PLATELET
Basophils Absolute: 0 10*3/uL (ref 0.0–0.1)
Basophils Relative: 0 %
Eosinophils Absolute: 0 10*3/uL (ref 0.0–0.7)
Eosinophils Relative: 0 %
HCT: 37.3 % (ref 36.0–46.0)
Hemoglobin: 12.1 g/dL (ref 12.0–15.0)
Lymphocytes Relative: 13 %
Lymphs Abs: 1.3 10*3/uL (ref 0.7–4.0)
MCH: 30.9 pg (ref 26.0–34.0)
MCHC: 32.4 g/dL (ref 30.0–36.0)
MCV: 95.2 fL (ref 78.0–100.0)
Monocytes Absolute: 0.9 10*3/uL (ref 0.1–1.0)
Monocytes Relative: 9 %
Neutro Abs: 7.9 10*3/uL — ABNORMAL HIGH (ref 1.7–7.7)
Neutrophils Relative %: 78 %
Platelets: 288 10*3/uL (ref 150–400)
RBC: 3.92 MIL/uL (ref 3.87–5.11)
RDW: 16.1 % — ABNORMAL HIGH (ref 11.5–15.5)
WBC: 10.1 10*3/uL (ref 4.0–10.5)

## 2017-07-19 LAB — INFLUENZA PANEL BY PCR (TYPE A & B)
Influenza A By PCR: POSITIVE — AB
Influenza B By PCR: NEGATIVE

## 2017-07-19 IMAGING — DX DG CHEST 2V
2 series · 2 of 2 positions shown · non-contrast
Comparison: Chest radiographs [DATE]. Thoracic spine
radiographs [DATE].

CLINICAL DATA: 46-year-old female with flu like symptoms for 1
week. Vomiting for 2 days. Cough and shortness of breath.

EXAM:
CHEST - 2 VIEW

[chest pa]
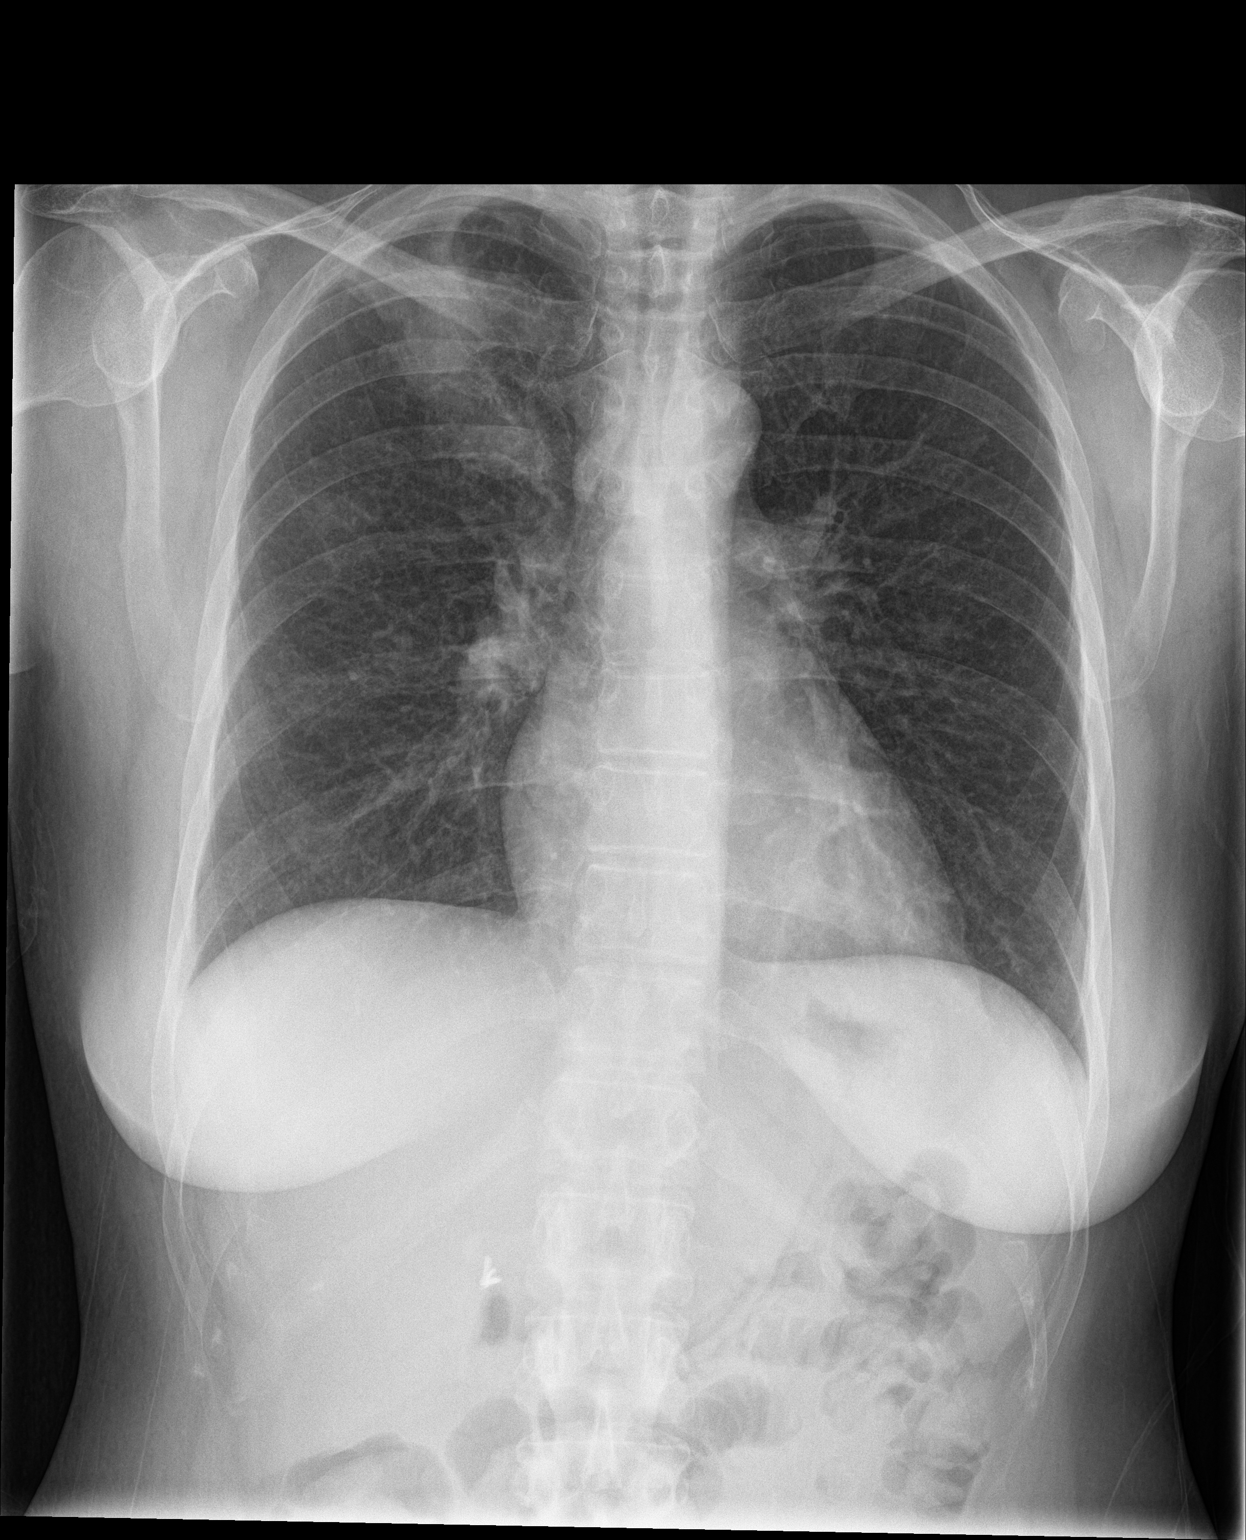

[chest lat]
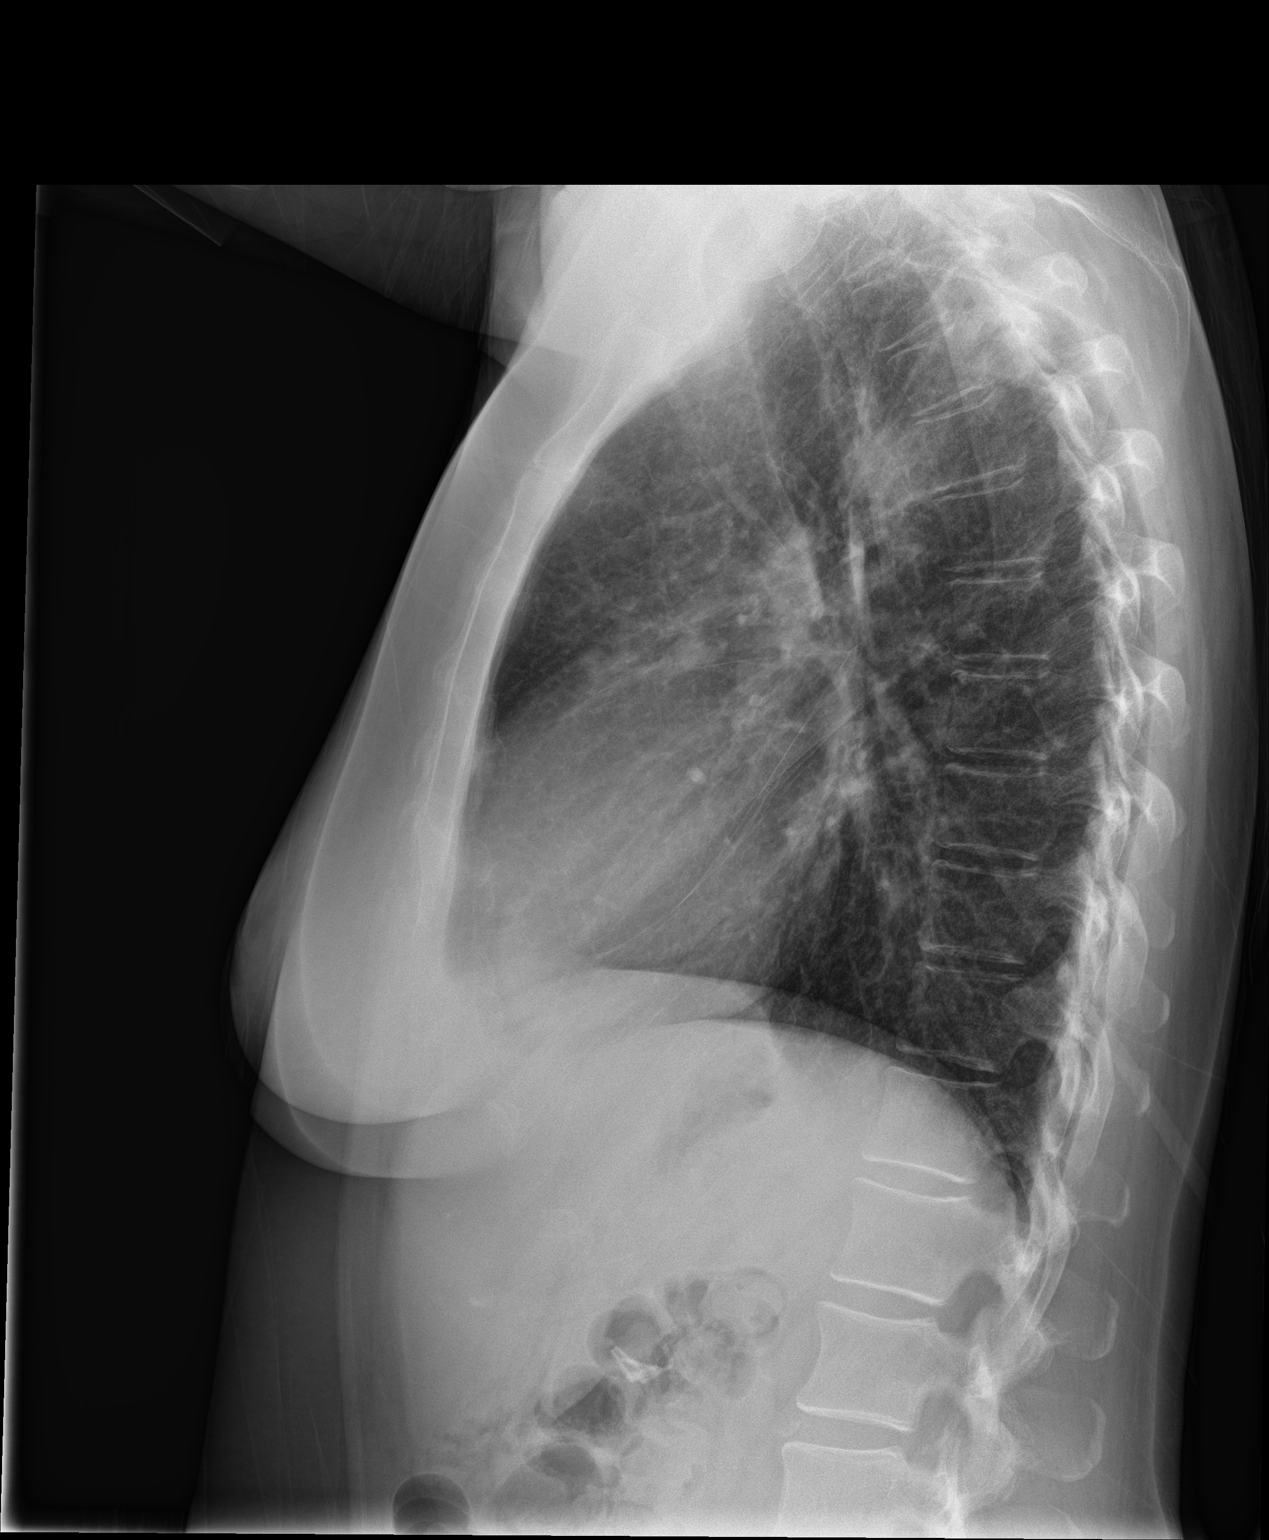

[2 of 2 positions shown; findings below may reference images not displayed]

FINDINGS: Confluent abnormal airspace opacity in the posterior right upper
lobe on both views. The lungs were clear on prior studies.
Superimposed widespread mild pulmonary interstitial opacity.
Mediastinal contours remain normal. Visualized tracheal air column
is within normal limits. No pneumothorax or pleural effusion. No
other confluent pulmonary opacity. No acute osseous abnormality
identified. Negative visible bowel gas pattern. Stable
cholecystectomy clips.
IMPRESSION: 1. Right Upper Lobe Pneumonia superimposed on mild diffuse pulmonary
interstitial opacity. Consider viral/atypical etiology.
2. No pleural effusion.
3. Followup PA and lateral chest X-ray is recommended in 3-4 weeks
following trial of antibiotic therapy to ensure resolution and
exclude underlying malignancy.

## 2017-07-19 MED ORDER — PROCHLORPERAZINE EDISYLATE 5 MG/ML IJ SOLN
10.0000 mg | Freq: Once | INTRAMUSCULAR | Status: AC
  Administered 2017-07-19: 10 mg via INTRAVENOUS
  Filled 2017-07-19: qty 2

## 2017-07-19 MED ORDER — ALBUTEROL SULFATE (2.5 MG/3ML) 0.083% IN NEBU
2.5000 mg | INHALATION_SOLUTION | Freq: Once | RESPIRATORY_TRACT | Status: AC
  Administered 2017-07-19: 2.5 mg via RESPIRATORY_TRACT
  Filled 2017-07-19: qty 3

## 2017-07-19 MED ORDER — POTASSIUM CHLORIDE CRYS ER 20 MEQ PO TBCR: 20.0000 meq | EXTENDED_RELEASE_TABLET | Freq: Two times a day (BID) | ORAL | 0 refills | Status: DC

## 2017-07-19 MED ORDER — POTASSIUM CHLORIDE CRYS ER 20 MEQ PO TBCR
20.0000 meq | EXTENDED_RELEASE_TABLET | Freq: Once | ORAL | Status: AC
  Administered 2017-07-19: 20 meq via ORAL
  Filled 2017-07-19: qty 1

## 2017-07-19 MED ORDER — IPRATROPIUM-ALBUTEROL 0.5-2.5 (3) MG/3ML IN SOLN
3.0000 mL | Freq: Once | RESPIRATORY_TRACT | Status: AC
  Administered 2017-07-19: 3 mL via RESPIRATORY_TRACT
  Filled 2017-07-19: qty 3

## 2017-07-19 MED ORDER — AZITHROMYCIN 500 MG IV SOLR
500.0000 mg | Freq: Once | INTRAVENOUS | Status: AC
  Administered 2017-07-19: 500 mg via INTRAVENOUS
  Filled 2017-07-19: qty 500

## 2017-07-19 MED ORDER — BENZONATATE 100 MG PO CAPS: 200.0000 mg | ORAL_CAPSULE | Freq: Three times a day (TID) | ORAL | 0 refills | Status: DC | PRN

## 2017-07-19 MED ORDER — AZITHROMYCIN 250 MG PO TABS: 250.0000 mg | ORAL_TABLET | Freq: Every day | ORAL | 0 refills | Status: DC

## 2017-07-19 MED ORDER — ONDANSETRON 4 MG PO TBDP: 4.0000 mg | ORAL_TABLET | Freq: Three times a day (TID) | ORAL | 0 refills | Status: DC | PRN

## 2017-07-19 MED ORDER — SODIUM CHLORIDE 0.9 % IV BOLUS (SEPSIS)
1000.0000 mL | Freq: Once | INTRAVENOUS | Status: AC
  Administered 2017-07-19: 1000 mL via INTRAVENOUS

## 2017-07-19 MED ORDER — DIPHENHYDRAMINE HCL 50 MG/ML IJ SOLN
12.5000 mg | Freq: Once | INTRAMUSCULAR | Status: AC
Start: 2017-07-19 — End: 2017-07-19
  Administered 2017-07-19: 12.5 mg via INTRAVENOUS
  Filled 2017-07-19: qty 1

## 2017-07-19 MED ORDER — DEXAMETHASONE SODIUM PHOSPHATE 10 MG/ML IJ SOLN
10.0000 mg | Freq: Once | INTRAMUSCULAR | Status: AC
  Administered 2017-07-19: 10 mg via INTRAVENOUS
  Filled 2017-07-19: qty 1

## 2017-07-19 MED ORDER — BENZONATATE 100 MG PO CAPS
200.0000 mg | ORAL_CAPSULE | Freq: Once | ORAL | Status: AC
  Administered 2017-07-19: 200 mg via ORAL
  Filled 2017-07-19: qty 2

## 2017-07-19 MED ORDER — ALBUTEROL SULFATE HFA 108 (90 BASE) MCG/ACT IN AERS
2.0000 | INHALATION_SPRAY | RESPIRATORY_TRACT | Status: DC | PRN
  Administered 2017-07-19: 2 via RESPIRATORY_TRACT
  Filled 2017-07-19: qty 6.7

## 2017-07-19 MED ORDER — ONDANSETRON HCL 4 MG/2ML IJ SOLN
4.0000 mg | Freq: Once | INTRAMUSCULAR | Status: AC
  Administered 2017-07-19: 4 mg via INTRAVENOUS
  Filled 2017-07-19: qty 2

## 2017-07-19 NOTE — ED Notes (Addendum)
Pt ambulated and maintained sats of 98. Nad. Water given. Pt states she is thirsty.

## 2017-07-19 NOTE — ED Triage Notes (Signed)
Flu like symptoms times one week.  Vomiting times 2 days.  Migraine times 2 days.

## 2017-07-19 NOTE — ED Provider Notes (Signed)
[facility] EMERGENCY DEPARTMENT Provider Note   CSN: [id number] Arrival date & time: 07/19/17  9735     History   Chief Complaint Chief Complaint  Patient presents with  . Influenza    HPI [name] is a 47 y.o. female with suspected influenza as her husband had similar symptoms last week.  She reports flu like symptoms for the past week including generalized body aches, subjective fever, rhinorrhea and cough. Over the past 2 days her symptoms have escalated, now with productive cough of a thick green to grey sputum, wheezing, shortness of breath and sharp chest pain with coughing and a migraine which started 2 days ago.  She endorses her headache is similar to prior migraines but has been more persistent, usually sleeping in a darkened room resolves her headaches.  She also has had n/v x 2 days, unable to tolerate anything PO.  She has had no treatment prior to arrival.  The history is provided by the patient and the spouse.    Past Medical History:  Diagnosis Date  . Chronic back pain   . Hypertension   . Migraine     Patient Active Problem List   Diagnosis Date Noted  . Lumbar radiculopathy 02/04/2017  . Lumbar degenerative disc disease 02/04/2017  . Lumbar spondylosis 02/04/2017  . Chronic low back pain with bilateral sciatica 02/04/2017  . Chronic pain syndrome 02/04/2017  . Lumbar sprain 02/04/2017  . Chronic migraine without aura without status migrainosus, not intractable 02/04/2017    Past Surgical History:  Procedure Laterality Date  . ABDOMINAL HYSTERECTOMY    . CHOLECYSTECTOMY    . HAND SURGERY      OB History    Gravida Para Term Preterm AB Living   2 2 2     2    SAB TAB Ectopic Multiple Live Births                   Home Medications    Prior to Admission medications   Medication Sig Start Date End Date Taking? Authorizing Provider  acetaminophen (TYLENOL) 500 MG tablet Take 1,000 mg by mouth every 8 (eight) hours as needed for mild pain,  moderate pain, fever or headache.    Yes [provider]  metoprolol succinate (TOPROL-XL) 100 MG 24 hr tablet Take 100 mg by mouth daily. Take with or immediately following a meal.   Yes [provider]  oxyCODONE-acetaminophen (PERCOCET/ROXICET) 5-325 MG tablet Take 1 tablet by mouth 2 (two) times daily as needed for severe pain. For chronic pain. To last for 30 days from fill date. 06/27/17  Yes Gillis Santa, MD  RaNITidine HCl (ZANTAC PO) Take 1 tablet by mouth 3 (three) times daily.  06/24/07  Yes [provider]  temazepam (RESTORIL) 30 MG capsule Take 30 mg by mouth at bedtime as needed for sleep.  01/17/16  Yes [provider]  azithromycin (ZITHROMAX) 250 MG tablet Take 1 tablet (250 mg total) by mouth daily. Take first 2 tablets together, then 1 every day until finished. 07/20/17   [name], PA-C  benzonatate (TESSALON) 100 MG capsule Take 2 capsules (200 mg total) by mouth 3 (three) times daily as needed. 07/19/17   Karla Pavone, Almyra Free, PA-C  ondansetron (ZOFRAN ODT) 4 MG disintegrating tablet Take 1 tablet (4 mg total) by mouth every 8 (eight) hours as needed for nausea or vomiting. 07/19/17   [name], PA-C  potassium chloride SA (K-DUR,KLOR-CON) 20 MEQ tablet Take 1 tablet (20  mEq total) by mouth 2 (two) times daily. 07/19/17   [name], PA-C    Family History Family History  Problem Relation Age of Onset  . Cancer Other   . Seizures Other   . Stroke Other   . Diabetes Other     Social History Social History   Tobacco Use  . Smoking status: Current Every Day Smoker    Packs/day: 1.00    Years: 20.00    Pack years: 20.00    Types: Cigarettes  . Smokeless tobacco: Never Used  Substance Use Topics  . Alcohol use: No  . Drug use: No     Allergies   Buprenorphine; Cefaclor; Gabapentin; Ibuprofen; Levofloxacin; Macrobid [nitrofurantoin monohyd macro]; Naproxen; and Penicillins   Review of Systems Review of Systems  Constitutional: Positive  for fatigue and fever.  HENT: Positive for congestion. Negative for sore throat.   Eyes: Positive for photophobia.  Respiratory: Positive for cough, chest tightness and wheezing. Negative for shortness of breath.   Cardiovascular: Positive for chest pain. Negative for palpitations.  Gastrointestinal: Positive for nausea and vomiting. Negative for abdominal pain.  Genitourinary: Negative.   Musculoskeletal: Positive for myalgias. Negative for arthralgias, joint swelling and neck pain.  Skin: Negative.  Negative for rash and wound.  Neurological: Positive for headaches. Negative for dizziness, weakness, light-headedness and numbness.  Psychiatric/Behavioral: Negative.      Physical Exam Updated Vital Signs BP 106/62   Pulse 90   Temp 99.6 F (37.6 C) (Oral)   Resp 17   Ht 5\' 1"  (1.549 m)   Wt 54.4 kg (120 lb)   SpO2 99%   BMI 22.67 kg/m   Physical Exam  Constitutional: She is oriented to person, place, and time. She appears well-developed and well-nourished.  Uncomfortable appearing  HENT:  Head: Normocephalic and atraumatic.  Mouth/Throat: Oropharynx is clear and moist.  Eyes: EOM are normal. Pupils are equal, round, and reactive to light.  Neck: Normal range of motion. Neck supple.  Cardiovascular: Normal rate and normal heart sounds.  Pulmonary/Chest: Effort normal. She has wheezes. She has rhonchi in the left middle field and the left lower field.  Bilateral wheezing with prolonged expirations. Rhonchi loudest left lung fields.  Abdominal: Soft. There is no tenderness.  Musculoskeletal: Normal range of motion.  Lymphadenopathy:    She has no cervical adenopathy.  Neurological: She is alert and oriented to person, place, and time. She has normal strength. No sensory deficit. Gait normal. GCS eye subscore is 4. GCS verbal subscore is 5. GCS motor subscore is 6.  Cranial nerves III-XII intact.  No pronator drift. Pt ambulated without deficit  Skin: Skin is warm and dry. No  rash noted.  Psychiatric: She has a normal mood and affect. Her speech is normal and behavior is normal. Thought content normal. Cognition and memory are normal.  Nursing note and vitals reviewed.    ED Treatments / Results  Labs (all labs ordered are listed, but only abnormal results are displayed) Labs Reviewed  CBC WITH DIFFERENTIAL/PLATELET - Abnormal; Notable for the following components:      Result Value   RDW 16.1 (*)    Neutro Abs 7.9 (*)    All other components within normal limits  BASIC METABOLIC PANEL - Abnormal; Notable for the following components:   Potassium 3.1 (*)    CO2 19 (*)    Glucose, Bld 117 (*)    Calcium 8.0 (*)    All other components within normal limits  INFLUENZA PANEL BY PCR (TYPE A & B) - Abnormal; Notable for the following components:   Influenza A By PCR POSITIVE (*)    All other components within normal limits    EKG  EKG Interpretation None       Radiology Dg Chest 2 View  Result Date: 07/19/2017 CLINICAL DATA:  47 year old female with flu like symptoms for 1 week. Vomiting for 2 days. Cough and shortness of breath. EXAM: CHEST - 2 VIEW COMPARISON:  Chest radiographs 07/29/2012. Thoracic spine radiographs 06/22/2013. FINDINGS: Confluent abnormal airspace opacity in the posterior right upper lobe on both views. The lungs were clear on prior studies. Superimposed widespread mild pulmonary interstitial opacity. Mediastinal contours remain normal. Visualized tracheal air column is within normal limits. No pneumothorax or pleural effusion. No other confluent pulmonary opacity. No acute osseous abnormality identified. Negative visible bowel gas pattern. Stable cholecystectomy clips. IMPRESSION: 1. Right Upper Lobe Pneumonia superimposed on mild diffuse pulmonary interstitial opacity. Consider viral/atypical etiology. 2. No pleural effusion. 3. Followup PA and lateral chest X-ray is recommended in 3-4 weeks following trial of antibiotic therapy to  ensure resolution and exclude underlying malignancy. Electronically Signed   By: Genevie Ann M.D.   On: 07/19/2017 11:25    Procedures Procedures (including critical care time)  Medications Ordered in ED Medications  potassium chloride SA (K-DUR,KLOR-CON) CR tablet 20 mEq (not administered)  albuterol (PROVENTIL HFA;VENTOLIN HFA) 108 (90 Base) MCG/ACT inhaler 2 puff (not administered)  sodium chloride 0.9 % bolus 1,000 mL (0 mLs Intravenous Stopped 07/19/17 1118)  ondansetron (ZOFRAN) injection 4 mg (4 mg Intravenous Given 07/19/17 1005)  sodium chloride 0.9 % bolus 1,000 mL (0 mLs Intravenous Stopped 07/19/17 1216)  prochlorperazine (COMPAZINE) injection 10 mg (10 mg Intravenous Given 07/19/17 1119)  dexamethasone (DECADRON) injection 10 mg (10 mg Intravenous Given 07/19/17 1119)  diphenhydrAMINE (BENADRYL) injection 12.5 mg (12.5 mg Intravenous Given 07/19/17 1118)  ipratropium-albuterol (DUONEB) 0.5-2.5 (3) MG/3ML nebulizer solution 3 mL (3 mLs Nebulization Given 07/19/17 1222)  albuterol (PROVENTIL) (2.5 MG/3ML) 0.083% nebulizer solution 2.5 mg (2.5 mg Nebulization Given 07/19/17 1222)  azithromycin (ZITHROMAX) 500 mg in sodium chloride 0.9 % 250 mL IVPB (0 mg Intravenous Stopped 07/19/17 1329)  benzonatate (TESSALON) capsule 200 mg (200 mg Oral Given 07/19/17 1215)  sodium chloride 0.9 % bolus 1,000 mL (1,000 mLs Intravenous New Bag/Given 07/19/17 1438)     Initial Impression / Assessment and Plan / ED Course  I have reviewed the triage vital signs and the nursing notes.  Pertinent labs & imaging results that were available during my care of the patient were reviewed by me and considered in my medical decision making (see chart for details).     Pt with multiple complaints including migraine, flulike symptoms, shortness of breath with wheezing.  Her workup is positive for both influenza and a right upper lobe pneumonia.  She was given IV fluids given her symptoms of weakness, nausea vomiting times 48  hours.  Her weakness was improved and she was able to ambulate without feeling lightheaded after receiving 3 L of fluid.  She also tolerated p.o. intake.  She was given a migraine cocktail with vast improvement in her headache symptoms at time of discharge.  Discussed x-ray findings including the pneumonia, she was started on Zithromax given multiple other allergies.  Of note, attempt to give her doxycycline however she states she is also allergic to this antibiotic as well as it causes joint stiffness.  She is hypokalemic today, probably due  to her nausea and vomiting.  She was given p.o. dosing of potassium and will receive prescription for supplementation times 1 week.  Home Zithromax, Tessalon, she was given a albuterol MDI if wheezing or shortness of breath resumes.  Strict return precautions were discussed.  Patient was advised to rest, drink plenty of fluids and get close follow-up care for any worsening symptoms.  Discussed with her the need for repeat chest x-ray in 3-4 weeks to confirm that her infection is resolved.  Final Clinical Impressions(s) / ED Diagnoses   Final diagnoses:  Influenza  Community acquired pneumonia of right upper lobe of lung ([place])  Dehydration  Nausea and vomiting, intractability of vomiting not specified, unspecified vomiting type    ED Discharge Orders        Ordered    azithromycin (ZITHROMAX) 250 MG tablet  Daily     07/19/17 1442    benzonatate (TESSALON) 100 MG capsule  3 times daily PRN     07/19/17 1442    potassium chloride SA (K-DUR,KLOR-CON) 20 MEQ tablet  2 times daily     07/19/17 1442    ondansetron (ZOFRAN ODT) 4 MG disintegrating tablet  Every 8 hours PRN     07/19/17 1443       [name], PA-C 07/19/17 1509    Virgel Manifold, MD 07/19/17 1541

## 2017-07-19 NOTE — ED Notes (Signed)
RT gave inhaler; Patient given discharge instruction, verbalized understand. IV removed, band aid applied. Patient ambulatory out of the department.

## 2017-07-19 NOTE — Discharge Instructions (Signed)
Take your next dose of the Zithromax antibiotic tomorrow morning.  Rest to make sure you are drinking plenty of fluids.  Use your inhaler every 4 hours if needed for return of wheezing coughing or shortness of breath.

## 2017-07-19 NOTE — ED Notes (Signed)
Rt aware of tx 

## 2017-08-05 ENCOUNTER — Encounter: Payer: Self-pay | Admitting: Student in an Organized Health Care Education/Training Program

## 2017-08-05 ENCOUNTER — Ambulatory Visit
Payer: BLUE CROSS/BLUE SHIELD | Attending: Student in an Organized Health Care Education/Training Program | Admitting: Student in an Organized Health Care Education/Training Program

## 2017-08-05 ENCOUNTER — Other Ambulatory Visit: Payer: Self-pay

## 2017-08-05 VITALS — BP 158/89 | HR 76 | Temp 98.3°F | Resp 16 | Ht 61.0 in | Wt 120.0 lb

## 2017-08-05 DIAGNOSIS — M4726 Other spondylosis with radiculopathy, lumbar region: Secondary | ICD-10-CM | POA: Insufficient documentation

## 2017-08-05 DIAGNOSIS — M47898 Other spondylosis, sacral and sacrococcygeal region: Secondary | ICD-10-CM | POA: Diagnosis not present

## 2017-08-05 DIAGNOSIS — M5136 Other intervertebral disc degeneration, lumbar region: Secondary | ICD-10-CM | POA: Diagnosis not present

## 2017-08-05 DIAGNOSIS — M4698 Unspecified inflammatory spondylopathy, sacral and sacrococcygeal region: Secondary | ICD-10-CM

## 2017-08-05 DIAGNOSIS — G894 Chronic pain syndrome: Secondary | ICD-10-CM

## 2017-08-05 DIAGNOSIS — G43909 Migraine, unspecified, not intractable, without status migrainosus: Secondary | ICD-10-CM | POA: Insufficient documentation

## 2017-08-05 DIAGNOSIS — I10 Essential (primary) hypertension: Secondary | ICD-10-CM | POA: Insufficient documentation

## 2017-08-05 DIAGNOSIS — F17210 Nicotine dependence, cigarettes, uncomplicated: Secondary | ICD-10-CM | POA: Diagnosis not present

## 2017-08-05 DIAGNOSIS — Z79899 Other long term (current) drug therapy: Secondary | ICD-10-CM | POA: Insufficient documentation

## 2017-08-05 DIAGNOSIS — Z79891 Long term (current) use of opiate analgesic: Secondary | ICD-10-CM | POA: Insufficient documentation

## 2017-08-05 DIAGNOSIS — Z8701 Personal history of pneumonia (recurrent): Secondary | ICD-10-CM | POA: Diagnosis not present

## 2017-08-05 DIAGNOSIS — M5116 Intervertebral disc disorders with radiculopathy, lumbar region: Secondary | ICD-10-CM | POA: Insufficient documentation

## 2017-08-05 DIAGNOSIS — M545 Low back pain: Secondary | ICD-10-CM | POA: Diagnosis present

## 2017-08-05 DIAGNOSIS — M461 Sacroiliitis, not elsewhere classified: Secondary | ICD-10-CM

## 2017-08-05 DIAGNOSIS — M5416 Radiculopathy, lumbar region: Secondary | ICD-10-CM

## 2017-08-05 DIAGNOSIS — M51369 Other intervertebral disc degeneration, lumbar region without mention of lumbar back pain or lower extremity pain: Secondary | ICD-10-CM

## 2017-08-05 DIAGNOSIS — G43709 Chronic migraine without aura, not intractable, without status migrainosus: Secondary | ICD-10-CM

## 2017-08-05 DIAGNOSIS — M47818 Spondylosis without myelopathy or radiculopathy, sacral and sacrococcygeal region: Secondary | ICD-10-CM

## 2017-08-05 MED ORDER — OXYCODONE-ACETAMINOPHEN 5-325 MG PO TABS: 1.0000 | ORAL_TABLET | Freq: Two times a day (BID) | ORAL | 0 refills | Status: DC | PRN

## 2017-08-05 NOTE — Progress Notes (Signed)
Nursing Pain Medication Assessment:  Safety precautions to be maintained throughout the outpatient stay will include: orient to surroundings, keep bed in low position, maintain call bell within reach at all times, provide assistance with transfer out of bed and ambulation.  Medication Inspection Compliance: Pill count conducted under aseptic conditions, in front of the patient. Neither the pills nor the bottle was removed from the patient's sight at any time. Once count was completed pills were immediately returned to the patient in their original bottle.  Medication: Oxycodone/APAP Pill/Patch Count: 0 of 60 pills remain Pill/Patch Appearance: Markings consistent with prescribed medication Bottle Appearance: Standard pharmacy container. Clearly labeled. Filled Date: 2 / 14 / 2019 Last Medication intake:  Ran out of medicine more than 48 hours ago

## 2017-08-05 NOTE — Progress Notes (Signed)
Patient's Name: [name]  MRN: [id number]  Referring Provider: [facility]  DOB: February 20, 1971  PCP: [facility]  DOS: 08/05/2017  Note by: Gillis Santa, MD  Service setting: Ambulatory outpatient  Specialty: Interventional Pain Management  Location: [facility] (AMB) Pain Management Facility    Patient type: Established   Primary Reason(s) for Visit: Encounter for prescription drug management. (Level of risk: moderate)  CC: Back Pain (lower)  HPI  [name] is a 47 y.o. year old, female patient, who comes today for a medication management evaluation. She has Lumbar radiculopathy; Lumbar degenerative disc disease; Lumbar spondylosis; Chronic low back pain with bilateral sciatica; Chronic pain syndrome; Lumbar sprain; and Chronic migraine without aura without status migrainosus, not intractable on their problem list. Her primarily concern today is the Back Pain (lower)  Pain Assessment: Location: Lower Back Radiating: denies Onset: More than a month ago Duration: Chronic pain Quality: Constant, Pounding, Stabbing Severity: 8 /10 (self-reported pain score)  Note: Reported level is inconsistent with clinical observations. Clinically the patient looks like a 2/10 A 2/10 is viewed as "Mild to Moderate" and described as noticeable and distracting. Impossible to hide from other people. More frequent flare-ups. Still possible to adapt and function close to normal. It can be very annoying and may have occasional stronger flare-ups. With discipline, patients may get used to it and adapt.       When using our objective Pain Scale, levels between 6 and 10/10 are said to belong in an emergency room, as it progressively worsens from a 6/10, described as severely limiting, requiring emergency care not usually available at an outpatient pain management facility. At a 6/10 level, communication becomes difficult and requires great effort. Assistance to reach the emergency  department may be required. Facial flushing and profuse sweating along with potentially dangerous increases in heart rate and blood pressure will be evident. Effect on ADL: it takes much longer to do things  Timing: Constant Modifying factors: medications  [name] was last scheduled for an appointment on 06/27/2017 for medication management. During today's appointment we reviewed [name]'s chronic pain status, as well as her outpatient medication regimen.  Patient was originally scheduled for bilateral SI joint injections today.  She was diagnosed with the flu earlier this month as well as pneumonia.  She states that her flu symptoms have improved but she is still dealing with chronic cough and URI symptoms from her persistent pneumonia.  She does endorse being febrile intermittently over the last week.  She has completed a course of antibiotics which includes 5 days of azithromycin.  Given that the patient has pneumonia and is intermittently febrile, decided to postpone procedure until the patient acute respiratory illness has improved.  Will provide medication refill today as scheduled.  Patient requesting to increase her Percocet to 3 times a day however I recommended that we should try SI joint injections to see if they could provide her with benefit in regards to her hip pain and back pain rather than going up on her opioid medications.  The patient  reports that she does not use drugs. Her body mass index is 22.67 kg/m.  Further details on both, my assessment(s), as well as the proposed treatment plan, please see below.  Controlled Substance Pharmacotherapy Assessment REMS (Risk Evaluation and Mitigation Strategy)  Analgesic: Percocet 5 mg twice daily as needed, quantity 71-monthMME/day: 15 mg/day.  [name] RN  08/05/2017  9:36 AM  Sign at  close encounter Nursing Pain Medication Assessment:  Safety precautions to be maintained throughout the outpatient stay will include: orient  to surroundings, keep bed in low position, maintain call bell within reach at all times, provide assistance with transfer out of bed and ambulation.  Medication Inspection Compliance: Pill count conducted under aseptic conditions, in front of the patient. Neither the pills nor the bottle was removed from the patient's sight at any time. Once count was completed pills were immediately returned to the patient in their original bottle.  Medication: Oxycodone/APAP Pill/Patch Count: 0 of 60 pills remain Pill/Patch Appearance: Markings consistent with prescribed medication Bottle Appearance: Standard pharmacy container. Clearly labeled. Filled Date: 2 / 14 / 2019 Last Medication intake:  Ran out of medicine more than 48 hours ago   Pharmacokinetics: Liberation and absorption (onset of action): WNL Distribution (time to peak effect): WNL Metabolism and excretion (duration of action): WNL         Pharmacodynamics: Desired effects: Analgesia: [name] reports >50% benefit. Functional ability: Patient reports that medication allows her to accomplish basic ADLs Clinically meaningful improvement in function (CMIF): Sustained CMIF goals met Perceived effectiveness: Described as relatively effective, allowing for increase in activities of daily living (ADL) Undesirable effects: Side-effects or Adverse reactions: None reported Monitoring: [place]: Online review of the past 61-monthperiod conducted. Compliant with practice rules and regulations Last UDS on record: Summary  Date Value Ref Range Status  06/27/2017 FINAL  Final    Comment:    ==================================================================== TOXASSURE SELECT 13 (MW) ==================================================================== Test                             Result       Flag       Units Drug Present and Declared for Prescription Verification   Oxycodone                      >7299        EXPECTED   ng/mg creat    Oxymorphone                    4171         EXPECTED   ng/mg creat   Noroxycodone                   >7299        EXPECTED   ng/mg creat   Noroxymorphone                 1593         EXPECTED   ng/mg creat    Sources of oxycodone are scheduled prescription medications.    Oxymorphone, noroxycodone, and noroxymorphone are expected    metabolites of oxycodone. Oxymorphone is also available as a    scheduled prescription medication. Drug Absent but Declared for Prescription Verification   Temazepam                      Not Detected UNEXPECTED ng/mg creat ==================================================================== Test                      Result    Flag   Units      Ref Range   Creatinine              137              mg/dL      >=  20 ==================================================================== Declared Medications:  The flagging and interpretation on this report are based on the  following declared medications.  Unexpected results may arise from  inaccuracies in the declared medications.  **Note: The testing scope of this panel includes these medications:  Oxycodone (Oxycodone Acetaminophen)  Temazepam  **Note: The testing scope of this panel does not include following  reported medications:  Acetaminophen  Acetaminophen (Oxycodone Acetaminophen)  Metoprolol  Ranitidine ==================================================================== For clinical consultation, please call [phone]. ====================================================================    UDS interpretation: Compliant          Medication Assessment Form: Reviewed. Patient indicates being compliant with therapy Treatment compliance: Compliant Risk Assessment Profile: Aberrant behavior: See prior evaluations. None observed or detected today Comorbid factors increasing risk of overdose: See prior notes. No additional risks detected today Risk of substance use disorder (SUD): Low Opioid Risk Tool  - 08/05/17 0941      Family History of Substance Abuse   Alcohol  Negative    Illegal Drugs  Negative    Rx Drugs  Negative      Personal History of Substance Abuse   Alcohol  Negative    Illegal Drugs  Negative    Rx Drugs  Negative      Age   Age between 40-45 years   No      History of Preadolescent Sexual Abuse   History of Preadolescent Sexual Abuse  Negative or Female      Psychological Disease   Psychological Disease  Negative    Depression  Negative      Total Score   Opioid Risk Tool Scoring  0    Opioid Risk Interpretation  Low Risk      ORT Scoring interpretation table:  Score <3 = Low Risk for SUD  Score between 4-7 = Moderate Risk for SUD  Score >8 = High Risk for Opioid Abuse   Risk Mitigation Strategies:  Patient Counseling: Covered Patient-Prescriber Agreement (PPA): Present and active  Notification to other healthcare providers: Done  Pharmacologic Plan: No change in therapy, at this time.             Laboratory Chemistry  Inflammation Markers (CRP: Acute Phase) (ESR: Chronic Phase) No results found for: CRP, ESRSEDRATE, LATICACIDVEN                       Rheumatology Markers No results found for: RF, ANA, Therisa D[place], [facility]              Renal Function Markers Lab Results  Component Value Date   BUN 11 07/19/2017   CREATININE 0.83 07/19/2017   GFRAA >60 07/19/2017   GFRNONAA >60 07/19/2017                 Hepatic Function Markers Lab Results  Component Value Date   AST 17 03/24/2013   ALT 8 03/24/2013   ALBUMIN 3.6 03/24/2013   ALKPHOS 90 03/24/2013   LIPASE 25 03/24/2013                 Electrolytes Lab Results  Component Value Date   NA 138 07/19/2017   K 3.1 (L) 07/19/2017   CL 105 07/19/2017   CALCIUM 8.0 (L) 07/19/2017                        Neuropathy Markers No results found for: VITAMINB12, FOLATE, HGBA1C, HIV  Bone Pathology Markers No results found for: VD25OH, H139778,  SF6812XN1, ZG0174BS4, 25OHVITD1, 25OHVITD2, 25OHVITD3, TESTOFREE, TESTOSTERONE                       Coagulation Parameters Lab Results  Component Value Date   PLT 288 07/19/2017                 Cardiovascular Markers Lab Results  Component Value Date   HGB 12.1 07/19/2017   HCT 37.3 07/19/2017                 CA Markers No results found for: CEA, CA125, LABCA2               Note: Lab results reviewed.  Recent Diagnostic Imaging Results  DG Chest 2 View CLINICAL DATA:  47 year old female with flu like symptoms for 1 week. Vomiting for 2 days. Cough and shortness of breath.  EXAM: CHEST - 2 VIEW  COMPARISON:  Chest radiographs 07/29/2012. Thoracic spine radiographs 06/22/2013.  FINDINGS: Confluent abnormal airspace opacity in the posterior right upper lobe on both views. The lungs were clear on prior studies. Superimposed widespread mild pulmonary interstitial opacity. Mediastinal contours remain normal. Visualized tracheal air column is within normal limits. No pneumothorax or pleural effusion. No other confluent pulmonary opacity. No acute osseous abnormality identified. Negative visible bowel gas pattern. Stable cholecystectomy clips.  IMPRESSION: 1. Right Upper Lobe Pneumonia superimposed on mild diffuse pulmonary interstitial opacity. Consider viral/atypical etiology. 2. No pleural effusion. 3. Followup PA and lateral chest X-ray is recommended in 3-4 weeks following trial of antibiotic therapy to ensure resolution and exclude underlying malignancy.  Electronically Signed   By: Genevie Ann M.D.   On: 07/19/2017 11:25  Complexity Note: Imaging results reviewed. Results shared with [name], using Layman's terms.                         Meds   Current Outpatient Medications:  .  acetaminophen (TYLENOL) 500 MG tablet, Take 1,000 mg by mouth every 8 (eight) hours as needed for mild pain, moderate pain, fever or headache. , Disp: , Rfl:  .  metoprolol succinate  (TOPROL-XL) 100 MG 24 hr tablet, Take 100 mg by mouth daily. Take with or immediately following a meal., Disp: , Rfl:  .  oxyCODONE-acetaminophen (PERCOCET/ROXICET) 5-325 MG tablet, Take 1 tablet by mouth 2 (two) times daily as needed for severe pain. For chronic pain.  To last for 30 days from fill date., Disp: 60 tablet, Rfl: 0 .  RaNITidine HCl (ZANTAC PO), Take 1 tablet by mouth 3 (three) times daily. , Disp: , Rfl:  .  temazepam (RESTORIL) 30 MG capsule, Take 30 mg by mouth at bedtime as needed for sleep. , Disp: , Rfl:   ROS  Constitutional: +intermittent fevers, +cough Gastrointestinal: No reported hemesis, hematochezia, vomiting, or acute GI distress Musculoskeletal: Denies any acute onset joint swelling, redness, loss of ROM, or weakness Neurological: No reported episodes of acute onset apraxia, aphasia, dysarthria, agnosia, amnesia, paralysis, loss of coordination, or loss of consciousness  Allergies  [name] is allergic to buprenorphine; cefaclor; gabapentin; ibuprofen; levofloxacin; macrobid [nitrofurantoin monohyd macro]; naproxen; and penicillins.  [place]  Drug: [name]  reports that she does not use drugs. Alcohol:  reports that she does not drink alcohol. Tobacco:  reports that she has been smoking cigarettes.  She has a 20.00 pack-year smoking history. She has never used smokeless tobacco.  Medical:  has a past medical history of Chronic back pain, Hypertension, and Migraine. Surgical: [name]  has a past surgical history that includes Abdominal hysterectomy; Cholecystectomy; and Hand surgery. Family: family history includes Cancer in her other; Diabetes in her other; Seizures in her other; Stroke in her other.  Constitutional Exam  General appearance: Well nourished, well developed, and well hydrated. In no apparent acute distress Vitals:   08/05/17 0926  BP: (!) 158/89  Pulse: 76  Resp: 16  Temp: 98.3 F (36.8 C)  TempSrc: Oral  SpO2: 100%  Weight: 120 lb (54.4  kg)  Height: '5\' 1"'  (1.549 m)   BMI Assessment: Estimated body mass index is 22.67 kg/m as calculated from the following:   Height as of this encounter: '5\' 1"'  (1.549 m).   Weight as of this encounter: 120 lb (54.4 kg).  BMI interpretation table: BMI level Category Range association with higher incidence of chronic pain  <18 kg/m2 Underweight   18.5-24.9 kg/m2 Ideal body weight   25-29.9 kg/m2 Overweight Increased incidence by 20%  30-34.9 kg/m2 Obese (Class I) Increased incidence by 68%  35-39.9 kg/m2 Severe obesity (Class II) Increased incidence by 136%  >40 kg/m2 Extreme obesity (Class III) Increased incidence by 254%   BMI Readings from Last 4 Encounters:  08/05/17 22.67 kg/m  07/19/17 22.67 kg/m  06/27/17 22.67 kg/m  06/04/17 22.67 kg/m   Wt Readings from Last 4 Encounters:  08/05/17 120 lb (54.4 kg)  07/19/17 120 lb (54.4 kg)  06/27/17 120 lb (54.4 kg)  06/04/17 120 lb (54.4 kg)  Psych/Mental status: Alert, oriented x 3 (person, place, & time)       Eyes: PERLA Respiratory: wearing face mask+cough  Cervical Spine Area Exam  Skin & Axial Inspection: No masses, redness, edema, swelling, or associated skin lesions Alignment: Symmetrical Functional ROM: Unrestricted ROM      Stability: No instability detected Muscle Tone/Strength: Functionally intact. No obvious neuro-muscular anomalies detected. Sensory (Neurological): Unimpaired Palpation: No palpable anomalies              Upper Extremity (UE) Exam    Side: Right upper extremity  Side: Left upper extremity  Skin & Extremity Inspection: Skin color, temperature, and hair growth are WNL. No peripheral edema or cyanosis. No masses, redness, swelling, asymmetry, or associated skin lesions. No contractures.  Skin & Extremity Inspection: Skin color, temperature, and hair growth are WNL. No peripheral edema or cyanosis. No masses, redness, swelling, asymmetry, or associated skin lesions. No contractures.  Functional ROM:  Unrestricted ROM          Functional ROM: Unrestricted ROM          Muscle Tone/Strength: Functionally intact. No obvious neuro-muscular anomalies detected.  Muscle Tone/Strength: Functionally intact. No obvious neuro-muscular anomalies detected.  Sensory (Neurological): Unimpaired          Sensory (Neurological): Unimpaired          Palpation: No palpable anomalies              Palpation: No palpable anomalies              Specialized Test(s): Deferred         Specialized Test(s): Deferred          Thoracic Spine Area Exam  Skin & Axial Inspection: No masses, redness, or swelling Alignment: Symmetrical Functional ROM: Unrestricted ROM Stability: No instability detected Muscle Tone/Strength: Functionally intact. No obvious neuro-muscular anomalies detected. Sensory (Neurological): Unimpaired Muscle strength & Tone: No  palpable anomalies  Lumbar Spine Area Exam  Skin & Axial Inspection: No masses, redness, or swelling Alignment: Symmetrical Functional ROM: Unrestricted ROM      Stability: No instability detected Muscle Tone/Strength: Functionally intact. No obvious neuro-muscular anomalies detected. Sensory (Neurological): Unimpaired Palpation: No palpable anomalies       Provocative Tests: Lumbar Hyperextension and rotation test: evaluation deferred today       Lumbar Lateral bending test: evaluation deferred today       Patrick's Maneuver: Positive for bilateral S-I arthralgia and for bilateral hip arthralgia  Gait & Posture Assessment  Ambulation: Unassisted Gait: Relatively normal for age and body habitus Posture: WNL   Lower Extremity Exam    Side: Right lower extremity  Side: Left lower extremity  Skin & Extremity Inspection: Skin color, temperature, and hair growth are WNL. No peripheral edema or cyanosis. No masses, redness, swelling, asymmetry, or associated skin lesions. No contractures.  Skin & Extremity Inspection: Skin color, temperature, and hair growth are WNL. No  peripheral edema or cyanosis. No masses, redness, swelling, asymmetry, or associated skin lesions. No contractures.  Functional ROM: Unrestricted ROM          Functional ROM: Unrestricted ROM          Muscle Tone/Strength: Functionally intact. No obvious neuro-muscular anomalies detected.  Muscle Tone/Strength: Functionally intact. No obvious neuro-muscular anomalies detected.  Sensory (Neurological): Unimpaired  Sensory (Neurological): Unimpaired  Palpation: No palpable anomalies  Palpation: No palpable anomalies   Assessment  Primary Diagnosis & Pertinent Problem List: The primary encounter diagnosis was SI joint arthritis ([place]). Diagnoses of Lumbar degenerative disc disease, Chronic pain syndrome, Chronic migraine without aura without status migrainosus, not intractable, and Lumbar radiculopathy were also pertinent to this visit.  Status Diagnosis  Persistent Persistent Persistent 1. SI joint arthritis ([place])   2. Lumbar degenerative disc disease   3. Chronic pain syndrome   4. Chronic migraine without aura without status migrainosus, not intractable   5. Lumbar radiculopathy      47 year old female with a history of axial low back pain, bilateral hip pain that radiates into her groin is secondary to lumbar degenerative disc disease and SI joint arthritis.  Patient was scheduled for bilateral SI joint injection today however she had the flu earlier this month which has improved however has persistent pneumonia and has been intermittently febrile and continues to have URI symptoms.  Decided to postpone her bilateral SI joint injection until her acute respiratory illness has improved.  We will provide refill for Percocet 5 mg twice daily as needed for 1 month.  Patient will follow-up in 2 weeks for medication management as well as bilateral SI joint injection at that time for SI joint arthritis.  Plan: -Refill Percocet for 1 month as below.  Medication refill at next procedural  visit -Scheduled for bilateral SI joint injection with sedation for SI joint arthritis. -Continue Tylenol 1000 mg twice daily to 3 times daily as needed for chronic pain    Plan of Care  Pharmacotherapy (Medications Ordered): Meds ordered this encounter  Medications  . oxyCODONE-acetaminophen (PERCOCET/ROXICET) 5-325 MG tablet    Sig: Take 1 tablet by mouth 2 (two) times daily as needed for severe pain. For chronic pain.  To last for 30 days from fill date.    Dispense:  60 tablet    Refill:  0   Lab-work, procedure(s), and/or referral(s): Orders Placed This Encounter  Procedures  . SACROILIAC JOINT INJECTION    Provider-requested follow-up: Return  in about 2 weeks (around 08/19/2017) for Procedure, Medication Management. Time Note: Greater than 50% of the 25 minute(s) of face-to-face time spent with [name], was spent in counseling/coordination of care regarding: [name] primary cause of pain, the treatment plan, treatment alternatives, the risks and possible complications of proposed treatment, going over the informed consent, the opioid analgesic risks and possible complications, the appropriate use of her medications, realistic expectations and the goals of pain management (increased in functionality). Future Appointments  Date Time Provider [employer]  08/19/2017  9:30 AM Gillis Santa, MD [facility] None    Primary Care Physician: [facility] Location: [facility] Note by: Gillis Santa, M.D Date: 08/05/2017; Time: 11:05 AM

## 2017-08-05 NOTE — Patient Instructions (Addendum)
You have been given Rx for Oxycodone to last until 4/24/2019____________________________________________________________________________________________  Genicular Nerve Block  What is a genicular nerve block? A genicular nerve block is the injection of a local anesthetic to block the nerves that transmits pain from the knee.  What is the purpose of a facet nerve block? A genicular nerve block is a diagnostic procedure to determine if the pathologic changes (i.e. arthritis, meniscal tears, etc) and inflammation within the knee joint is the source of your knee pain. It also confirms that the knee pain will respond well to the actual treatment procedure. If a genicular nerve block works, it will give you relief for several hours. After that, the pain is expected to return to normal. This test is always performed twice (usually a week or two apart) because two successful tests are required to move onto treatment. If both diagnostic tests are positive, then we schedule a treatment called radiofrequency (RF) ablation. In this procedure, the same nerves are cauterized, which typically leads to pain relief for 4 -18 months. If this process works well for one knee, it can be performed on the other knee if needed.  How is the procedure performed? You will be placed on the procedure table. The injection site is sterilized with either iodine or chlorhexadine. The site to be injected is numbed with a local anesthetic, and a needle is directed to the target area. X-ray guidance is used to ensure proper placement and positioning of the needle. When the needle is properly positioned near the genicular nerve, local anesthetic is injected to numb that nerve. This will be repeated at multiple sites around the knee to block all genicular nerves.  Will the procedure be painful? The injection can be painful and we therefore provide the option of receiving IV sedation. IV sedation, combined with local anesthetic, can make  the injection nearly pain free. It allows you to remain very still during the procedure, which can also make the injection easier, faster, and more successful. If you decide to have IV sedation, you must have a driver to get you home safely afterwards. In addition, you cannot have anything to eat or drink within 8 hours of your appointment (clear liquids are allowed until 3 hours before the procedure). If you take medications for diabetes, these medications may need to be adjusted the morning of the procedure. Your primary care physician can help you with this adjustment.  What are the discharge instructions? If you received IV sedation do not drive or operate machinery for at least 24 hours after the procedure. You may return to work the next day following your procedure. You may resume your normal diet immediately. Do not engage in any strenuous activity for 24 hours. You should, however, engage in moderate activity that typically causes your ususal pain. If the block works, those activities should not be painful for several hours after the injection. Do not take a bath, swim, or use a hot tub for 24 hours (you may take a shower). Call the office if you have any of the following: severe pain afterwards (different than your usual symptoms), redness/swelling/discharge at the injection site(s), fevers/chills, difficulty with bowel or bladder functions.  What are the risks and side effects? The complication rate for this procedure is very low. Whenever a needle enters the skin, bleeding or infection can occur. Some other serious but extremely rare risks include paralysis and death. You may have an allergic reaction to any of the medications used. If you have  a known allergy to any medications, especially local anesthetics, notify our staff before the procedure takes place. You may experience any of the following side effects up to 4 - 6 hours after the procedure: . Leg muscle weakness or numbness may occur  due to the local anesthetic affecting the nerves that control your legs (this is a temporary affect and it is not paralysis). If you have any leg weakness or numbness, walk only with assistance in order to prevent falls and injury. Your leg strength will return slowly and completely. . Dizziness may occur due to a decrease in your blood pressure. If this occurs, remain in a seated or lying position. Gradually sit up, and then stand after at least 10 minutes of sitting. . Mild headaches may occur. Drink fluids and take pain medications if needed. If the headaches persist or become severe, call the office. . Mild discomfort at the injection site can occur. This typically lasts for a few hours but can persist for a couple days. If this occurs, take anti-inflammatories or pain medications, apply ice to the area the day of the procedure. If it persists, apply moist heat in the day(s) following.  The side effects listed above can be normal. They are not dangerous and will resolve on their own. If, however, you experience any of the following, a complication may have occurred and you should either contact your doctor. If he is not readily available, then you should proceed to the closest urgent care center for evaluation: . Severe or progressive pain at the injection site(s) . Arm or leg weakness that progressively worsens or persists for longer than 8 hours . Severe or progressive redness, swelling, or discharge from the injections site(s) . Fevers, chills, nausea, or vomiting . Bowel or bladder dysfunction (i.e. inability to urinate or pass stool or difficulty controlling either)  How long does it take for the procedure to work? You should feel relief from your usual pain within the first hour. Again, this is only expected to last for several hours, at the most. Remember, you may be sore in the middle part of your back from the needles, and you must distinguish this from your usual  pain. ____________________________________________________________________________________________  GENERAL RISKS AND COMPLICATIONS  What are the risk, side effects and possible complications? Generally speaking, most procedures are safe.  However, with any procedure there are risks, side effects, and the possibility of complications.  The risks and complications are dependent upon the sites that are lesioned, or the type of nerve block to be performed.  The closer the procedure is to the spine, the more serious the risks are.  Great care is taken when placing the radio frequency needles, block needles or lesioning probes, but sometimes complications can occur. 1. Infection: Any time there is an injection through the skin, there is a risk of infection.  This is why sterile conditions are used for these blocks.  There are four possible types of infection. 1. Localized skin infection. 2. Central Nervous System Infection-This can be in the form of Meningitis, which can be deadly. 3. Epidural Infections-This can be in the form of an epidural abscess, which can cause pressure inside of the spine, causing compression of the spinal cord with subsequent paralysis. This would require an emergency surgery to decompress, and there are no guarantees that the patient would recover from the paralysis. 4. Discitis-This is an infection of the intervertebral discs.  It occurs in about 1% of discography procedures.  It is  difficult to treat and it may lead to surgery.        2. Pain: the needles have to go through skin and soft tissues, will cause soreness.       3. Damage to internal structures:  The nerves to be lesioned may be near blood vessels or    other nerves which can be potentially damaged.       4. Bleeding: Bleeding is more common if the patient is taking blood thinners such as  aspirin, Coumadin, Ticiid, Plavix, etc., or if he/she have some genetic predisposition  such as hemophilia. Bleeding into the  spinal canal can cause compression of the spinal  cord with subsequent paralysis.  This would require an emergency surgery to  decompress and there are no guarantees that the patient would recover from the  paralysis.       5. Pneumothorax:  Puncturing of a lung is a possibility, every time a needle is introduced in  the area of the chest or upper back.  Pneumothorax refers to free air around the  collapsed lung(s), inside of the thoracic cavity (chest cavity).  Another two possible  complications related to a similar event would include: Hemothorax and Chylothorax.   These are variations of the Pneumothorax, where instead of air around the collapsed  lung(s), you may have blood or chyle, respectively.       6. Spinal headaches: They may occur with any procedures in the area of the spine.       7. Persistent CSF (Cerebro-Spinal Fluid) leakage: This is a rare problem, but may occur  with prolonged intrathecal or epidural catheters either due to the formation of a fistulous  track or a dural tear.       8. Nerve damage: By working so close to the spinal cord, there is always a possibility of  nerve damage, which could be as serious as a permanent spinal cord injury with  paralysis.       9. Death:  Although rare, severe deadly allergic reactions known as "Anaphylactic  reaction" can occur to any of the medications used.      10. Worsening of the symptoms:  We can always make thing worse.  What are the chances of something like this happening? Chances of any of this occuring are extremely low.  By statistics, you have more of a chance of getting killed in a motor vehicle accident: while driving to the hospital than any of the above occurring .  Nevertheless, you should be aware that they are possibilities.  In general, it is similar to taking a shower.  Everybody knows that you can slip, hit your head and get killed.  Does that mean that you should not shower again?  Nevertheless always keep in mind that  statistics do not mean anything if you happen to be on the wrong side of them.  Even if a procedure has a 1 (one) in a 1,000,000 (million) chance of going wrong, it you happen to be that one..Also, keep in mind that by statistics, you have more of a chance of having something go wrong when taking medications.  Who should not have this procedure? If you are on a blood thinning medication (e.g. Coumadin, Plavix, see list of "Blood Thinners"), or if you have an active infection going on, you should not have the procedure.  If you are taking any blood thinners, please inform your physician.  How should I prepare for this procedure?  Do not  eat or drink anything at least six hours prior to the procedure.  Bring a driver with you .  It cannot be a taxi.  Come accompanied by an adult that can drive you back, and that is strong enough to help you if your legs get weak or numb from the local anesthetic.  Take all of your medicines the morning of the procedure with just enough water to swallow them.  If you have diabetes, make sure that you are scheduled to have your procedure done first thing in the morning, whenever possible.  If you have diabetes, take only half of your insulin dose and notify our nurse that you have done so as soon as you arrive at the clinic.  If you are diabetic, but only take blood sugar pills (oral hypoglycemic), then do not take them on the morning of your procedure.  You may take them after you have had the procedure.  Do not take aspirin or any aspirin-containing medications, at least eleven (11) days prior to the procedure.  They may prolong bleeding.  Wear loose fitting clothing that may be easy to take off and that you would not mind if it got stained with Betadine or blood.  Do not wear any jewelry or perfume  Remove any nail coloring.  It will interfere with some of our monitoring equipment.  NOTE: Remember that this is not meant to be interpreted as a complete  list of all possible complications.  Unforeseen problems may occur.  BLOOD THINNERS The following drugs contain aspirin or other products, which can cause increased bleeding during surgery and should not be taken for 2 weeks prior to and 1 week after surgery.  If you should need take something for relief of minor pain, you may take acetaminophen which is found in Tylenol,m Datril, Anacin-3 and Panadol. It is not blood thinner. The products listed below are.  Do not take any of the products listed below in addition to any listed on your instruction sheet.  A.P.C or A.P.C with Codeine Codeine Phosphate Capsules #3 Ibuprofen Ridaura  ABC compound Congesprin Imuran rimadil  Advil Cope Indocin Robaxisal  Alka-Seltzer Effervescent Pain Reliever and Antacid Coricidin or Coricidin-D  Indomethacin Rufen  Alka-Seltzer plus Cold Medicine Cosprin Ketoprofen S-A-C Tablets  Anacin Analgesic Tablets or Capsules Coumadin Korlgesic Salflex  Anacin Extra Strength Analgesic tablets or capsules CP-2 Tablets Lanoril Salicylate  Anaprox Cuprimine Capsules Levenox Salocol  Anexsia-D Dalteparin Magan Salsalate  Anodynos Darvon compound Magnesium Salicylate Sine-off  Ansaid Dasin Capsules Magsal Sodium Salicylate  Anturane Depen Capsules Marnal Soma  APF Arthritis pain formula Dewitt's Pills Measurin Stanback  Argesic Dia-Gesic Meclofenamic Sulfinpyrazone  Arthritis Bayer Timed Release Aspirin Diclofenac Meclomen Sulindac  Arthritis pain formula Anacin Dicumarol Medipren Supac  Analgesic (Safety coated) Arthralgen Diffunasal Mefanamic Suprofen  Arthritis Strength Bufferin Dihydrocodeine Mepro Compound Suprol  Arthropan liquid Dopirydamole Methcarbomol with Aspirin Synalgos  ASA tablets/Enseals Disalcid Micrainin Tagament  Ascriptin Doan's Midol Talwin  Ascriptin A/D Dolene Mobidin Tanderil  Ascriptin Extra Strength Dolobid Moblgesic Ticlid  Ascriptin with Codeine Doloprin or Doloprin with Codeine Momentum  Tolectin  Asperbuf Duoprin Mono-gesic Trendar  Aspergum Duradyne Motrin or Motrin IB Triminicin  Aspirin plain, buffered or enteric coated Durasal Myochrisine Trigesic  Aspirin Suppositories Easprin Nalfon Trillsate  Aspirin with Codeine Ecotrin Regular or Extra Strength Naprosyn Uracel  Atromid-S Efficin Naproxen Ursinus  Auranofin Capsules Elmiron Neocylate Vanquish  Axotal Emagrin Norgesic Verin  Azathioprine Empirin or Empirin with Codeine Normiflo Vitamin E  Azolid  Emprazil Nuprin Voltaren  Bayer Aspirin plain, buffered or children's or timed BC Tablets or powders Encaprin Orgaran Warfarin Sodium  Buff-a-Comp Enoxaparin Orudis Zorpin  Buff-a-Comp with Codeine Equegesic Os-Cal-Gesic   Buffaprin Excedrin plain, buffered or Extra Strength Oxalid   Bufferin Arthritis Strength Feldene Oxphenbutazone   Bufferin plain or Extra Strength Feldene Capsules Oxycodone with Aspirin   Bufferin with Codeine Fenoprofen Fenoprofen Pabalate or Pabalate-SF   Buffets II Flogesic Panagesic   Buffinol plain or Extra Strength Florinal or Florinal with Codeine Panwarfarin   Buf-Tabs Flurbiprofen Penicillamine   Butalbital Compound Four-way cold tablets Penicillin   Butazolidin Fragmin Pepto-Bismol   Carbenicillin Geminisyn Percodan   Carna Arthritis Reliever Geopen Persantine   Carprofen Gold's salt Persistin   Chloramphenicol Goody's Phenylbutazone   Chloromycetin Haltrain Piroxlcam   Clmetidine heparin Plaquenil   Cllnoril Hyco-pap Ponstel   Clofibrate Hydroxy chloroquine Propoxyphen         Before stopping any of these medications, be sure to consult the physician who ordered them.  Some, such as Coumadin (Warfarin) are ordered to prevent or treat serious conditions such as "deep thrombosis", "pumonary embolisms", and other heart problems.  The amount of time that you may need off of the medication may also vary with the medication and the reason for which you were taking it.  If you are taking any  of these medications, please make sure you notify your pain physician before you undergo any procedures.         Sacroiliac (SI) Joint Injection Patient Information  Description: The sacroiliac joint connects the scrum (very low back and tailbone) to the ilium (a pelvic bone which also forms half of the hip joint).  Normally this joint experiences very little motion.  When this joint becomes inflamed or unstable low back and or hip and pelvis pain may result.  Injection of this joint with local anesthetics (numbing medicines) and steroids can provide diagnostic information and reduce pain.  This injection is performed with the aid of x-ray guidance into the tailbone area while you are lying on your stomach.   You may experience an electrical sensation down the leg while this is being done.  You may also experience numbness.  We also may ask if we are reproducing your normal pain during the injection.  Conditions which may be treated SI injection:   Low back, buttock, hip or leg pain  Preparation for the Injection:  1. Do not eat any solid food or dairy products within 8 hours of your appointment.  2. You may drink clear liquids up to 3 hours before appointment.  Clear liquids include water, black coffee, juice or soda.  No milk or cream please. 3. You may take your regular medications, including pain medications with a sip of water before your appointment.  Diabetics should hold regular insulin (if take separately) and take 1/2 normal NPH dose the morning of the procedure.  Carry some sugar containing items with you to your appointment. 4. A driver must accompany you and be prepared to drive you home after your procedure. 5. Bring all of your current medications with you. 6. An IV may be inserted and sedation may be given at the discretion of the physician. 7. A blood pressure cuff, EKG and other monitors will often be applied during the procedure.  Some patients may need to have extra  oxygen administered for a short period.  8. You will be asked to provide medical information, including your allergies, prior to the  procedure.  We must know immediately if you are taking blood thinners (like Coumadin/Warfarin) or if you are allergic to IV iodine contrast (dye).  We must know if you could possible be pregnant.  Possible side effects:   Bleeding from needle site  Infection (rare, may require surgery)  Nerve injury (rare)  Numbness & tingling (temporary)  A brief convulsion or seizure  Light-headedness (temporary)  Pain at injection site (several days)  Decreased blood pressure (temporary)  Weakness in the leg (temporary)   Call if you experience:   New onset weakness or numbness of an extremity below the injection site that last more than 8 hours.  Hives or difficulty breathing ( go to the emergency room)  Inflammation or drainage at the injection site  Any new symptoms which are concerning to you  Please note:  Although the local anesthetic injected can often make your back/ hip/ buttock/ leg feel good for several hours after the injections, the pain will likely return.  It takes 3-7 days for steroids to work in the sacroiliac area.  You may not notice any pain relief for at least that one week.  If effective, we will often do a series of three injections spaced 3-6 weeks apart to maximally decrease your pain.  After the initial series, we generally will wait some months before a repeat injection of the same type.  If you have any questions, please call [phone] [facility]

## 2017-08-19 ENCOUNTER — Ambulatory Visit: Payer: BLUE CROSS/BLUE SHIELD | Admitting: Student in an Organized Health Care Education/Training Program

## 2017-09-10 ENCOUNTER — Other Ambulatory Visit: Payer: Self-pay

## 2017-09-10 ENCOUNTER — Encounter: Payer: Self-pay | Admitting: Student in an Organized Health Care Education/Training Program

## 2017-09-10 ENCOUNTER — Ambulatory Visit
Payer: BLUE CROSS/BLUE SHIELD | Attending: Student in an Organized Health Care Education/Training Program | Admitting: Student in an Organized Health Care Education/Training Program

## 2017-09-10 VITALS — BP 149/82 | HR 83 | Temp 97.9°F | Resp 18 | Ht 61.0 in | Wt 115.0 lb

## 2017-09-10 DIAGNOSIS — M5442 Lumbago with sciatica, left side: Secondary | ICD-10-CM

## 2017-09-10 DIAGNOSIS — Z79899 Other long term (current) drug therapy: Secondary | ICD-10-CM | POA: Diagnosis not present

## 2017-09-10 DIAGNOSIS — M47816 Spondylosis without myelopathy or radiculopathy, lumbar region: Secondary | ICD-10-CM | POA: Diagnosis not present

## 2017-09-10 DIAGNOSIS — Z881 Allergy status to other antibiotic agents status: Secondary | ICD-10-CM | POA: Diagnosis not present

## 2017-09-10 DIAGNOSIS — G894 Chronic pain syndrome: Secondary | ICD-10-CM | POA: Diagnosis not present

## 2017-09-10 DIAGNOSIS — G8929 Other chronic pain: Secondary | ICD-10-CM

## 2017-09-10 DIAGNOSIS — Z79891 Long term (current) use of opiate analgesic: Secondary | ICD-10-CM | POA: Diagnosis not present

## 2017-09-10 DIAGNOSIS — M5136 Other intervertebral disc degeneration, lumbar region: Secondary | ICD-10-CM | POA: Diagnosis not present

## 2017-09-10 DIAGNOSIS — S335XXA Sprain of ligaments of lumbar spine, initial encounter: Secondary | ICD-10-CM

## 2017-09-10 DIAGNOSIS — F329 Major depressive disorder, single episode, unspecified: Secondary | ICD-10-CM | POA: Insufficient documentation

## 2017-09-10 DIAGNOSIS — M5416 Radiculopathy, lumbar region: Secondary | ICD-10-CM | POA: Diagnosis not present

## 2017-09-10 DIAGNOSIS — F17210 Nicotine dependence, cigarettes, uncomplicated: Secondary | ICD-10-CM | POA: Insufficient documentation

## 2017-09-10 DIAGNOSIS — M4726 Other spondylosis with radiculopathy, lumbar region: Secondary | ICD-10-CM | POA: Insufficient documentation

## 2017-09-10 DIAGNOSIS — Z760 Encounter for issue of repeat prescription: Secondary | ICD-10-CM | POA: Diagnosis not present

## 2017-09-10 DIAGNOSIS — M47818 Spondylosis without myelopathy or radiculopathy, sacral and sacrococcygeal region: Secondary | ICD-10-CM | POA: Diagnosis not present

## 2017-09-10 DIAGNOSIS — G43709 Chronic migraine without aura, not intractable, without status migrainosus: Secondary | ICD-10-CM | POA: Diagnosis not present

## 2017-09-10 DIAGNOSIS — M5116 Intervertebral disc disorders with radiculopathy, lumbar region: Secondary | ICD-10-CM | POA: Diagnosis not present

## 2017-09-10 DIAGNOSIS — I10 Essential (primary) hypertension: Secondary | ICD-10-CM | POA: Diagnosis not present

## 2017-09-10 DIAGNOSIS — Z880 Allergy status to penicillin: Secondary | ICD-10-CM | POA: Insufficient documentation

## 2017-09-10 DIAGNOSIS — Z888 Allergy status to other drugs, medicaments and biological substances status: Secondary | ICD-10-CM | POA: Insufficient documentation

## 2017-09-10 DIAGNOSIS — Z886 Allergy status to analgesic agent status: Secondary | ICD-10-CM | POA: Diagnosis not present

## 2017-09-10 DIAGNOSIS — Z5181 Encounter for therapeutic drug level monitoring: Secondary | ICD-10-CM | POA: Diagnosis present

## 2017-09-10 MED ORDER — OXYCODONE-ACETAMINOPHEN 5-325 MG PO TABS: 1.0000 | ORAL_TABLET | Freq: Two times a day (BID) | ORAL | 0 refills | Status: DC | PRN

## 2017-09-10 NOTE — Progress Notes (Signed)
  Safety precautions to be maintained throughout the outpatient stay will include: orient to surroundings, keep bed in low position, maintain call bell within reach at all times, provide assistance with transfer out of bed and ambulation.   Nursing Pain Medication Assessment:  Safety precautions to be maintained throughout the outpatient stay will include: orient to surroundings, keep bed in low position, maintain call bell within reach at all times, provide assistance with transfer out of bed and ambulation.  Medication Inspection Compliance: Pill count conducted under aseptic conditions, in front of the patient. Neither the pills nor the bottle was removed from the patient's sight at any time. Once count was completed pills were immediately returned to the patient in their original bottle.  Medication: Oxycodone/APAP Pill/Patch Count: 0 of 60 pills remain Pill/Patch Appearance: Markings consistent with prescribed medication Bottle Appearance: Standard pharmacy container. Clearly labeled. Filled Date: 03 / 26 / 2019 Last Medication intake:  09/08/2017

## 2017-09-10 NOTE — Patient Instructions (Signed)
____________________________________________________________________________________________  Preparing for Procedure with Sedation  Instructions: . Oral Intake: Do not eat or drink anything for at least 8 hours prior to your procedure. . Transportation: Public transportation is not allowed. Bring an adult driver. The driver must be physically present in our waiting room before any procedure can be started. . Physical Assistance: Bring an adult physically capable of assisting you, in the event you need help. This adult should keep you company at home for at least 6 hours after the procedure. . Blood Pressure Medicine: Take your blood pressure medicine with a sip of water the morning of the procedure. . Blood thinners:  . Diabetics on insulin: Notify the staff so that you can be scheduled 1st case in the morning. If your diabetes requires high dose insulin, take only  of your normal insulin dose the morning of the procedure and notify the staff that you have done so. . Preventing infections: Shower with an antibacterial soap the morning of your procedure. . Build-up your immune system: Take 1000 mg of Vitamin C with every meal (3 times a day) the day prior to your procedure. . Antibiotics: Inform the staff if you have a condition or reason that requires you to take antibiotics before dental procedures. . Pregnancy: If you are pregnant, call and cancel the procedure. . Sickness: If you have a cold, fever, or any active infections, call and cancel the procedure. . Arrival: You must be in the facility at least 30 minutes prior to your scheduled procedure. . Children: Do not bring children with you. . Dress appropriately: Bring dark clothing that you would not mind if they get stained. . Valuables: Do not bring any jewelry or valuables.  Procedure appointments are reserved for interventional treatments only. . No Prescription Refills. . No medication changes will be discussed during procedure  appointments. . No disability issues will be discussed.  Remember:  Regular Business hours are:  Monday to Thursday 8:00 AM to 4:00 PM  Provider's Schedule: Francisco Naveira, MD:  Procedure days: Tuesday and Thursday 7:30 AM to 4:00 PM  Bilal Lateef, MD:  Procedure days: Monday and Wednesday 7:30 AM to 4:00 PM ____________________________________________________________________________________________    

## 2017-09-10 NOTE — Progress Notes (Signed)
Patient's Name: [name]  MRN: [id number]  Referring Provider: [facility]  DOB: April 16, 1971  PCP: [facility]  DOS: 09/10/2017  Note by: Gillis Santa, MD  Service setting: Ambulatory outpatient  Specialty: Interventional Pain Management  Location: [facility] (AMB) Pain Management Facility    Patient type: Established   Primary Reason(s) for Visit: Encounter for prescription drug management. (Level of risk: moderate)  CC: Medication Refill (Oxycodone APAP); Back Pain; and Leg Pain  HPI  [name] is a 47 y.o. year old, female patient, who comes today for a medication management evaluation. She has Lumbar radiculopathy; Lumbar degenerative disc disease; Lumbar spondylosis; Chronic low back pain with bilateral sciatica; Chronic pain syndrome; Lumbar sprain; and Chronic migraine without aura without status migrainosus, not intractable on their problem list. Her primarily concern today is the Medication Refill (Oxycodone APAP); Back Pain; and Leg Pain  Pain Assessment: Location: Lower Back Radiating: Radiates to right leg to right foot  Onset: More than a month ago Duration: Chronic pain Quality: Constant, Stabbing, Pounding Severity: 9 /10 (self-reported pain score)  Note: Reported level is inconsistent with clinical observations. Clinically the patient looks like a 3/10             When using our objective Pain Scale, levels between 6 and 10/10 are said to belong in an emergency room, as it progressively worsens from a 6/10, described as severely limiting, requiring emergency care not usually available at an outpatient pain management facility. At a 6/10 level, communication becomes difficult and requires great effort. Assistance to reach the emergency department may be required. Facial flushing and profuse sweating along with potentially dangerous increases in heart rate and blood pressure will be evident. Effect on ADL: Unable to work, walking, driving, takes  longer to do daily activities  Timing: Constant Modifying factors: Medications   [name] was last scheduled for an appointment on 08/05/2017 for medication management. During today's appointment we reviewed [name]'s chronic pain status, as well as her outpatient medication regimen.  The patient  reports that she does not use drugs. Her body mass index is 21.73 kg/m.  Further details on both, my assessment(s), as well as the proposed treatment plan, please see below.  Controlled Substance Pharmacotherapy Assessment REMS (Risk Evaluation and Mitigation Strategy)  Analgesic: Percocet 5 mg BID prn MME/day: 15 mg/day.   Janne Napoleon, RN  09/10/2017  1:22 PM  Sign at close encounter  Safety precautions to be maintained throughout the outpatient stay will include: orient to surroundings, keep bed in low position, maintain call bell within reach at all times, provide assistance with transfer out of bed and ambulation.   Nursing Pain Medication Assessment:  Safety precautions to be maintained throughout the outpatient stay will include: orient to surroundings, keep bed in low position, maintain call bell within reach at all times, provide assistance with transfer out of bed and ambulation.  Medication Inspection Compliance: Pill count conducted under aseptic conditions, in front of the patient. Neither the pills nor the bottle was removed from the patient's sight at any time. Once count was completed pills were immediately returned to the patient in their original bottle.  Medication: Oxycodone/APAP Pill/Patch Count: 0 of 60 pills remain Pill/Patch Appearance: Markings consistent with prescribed medication Bottle Appearance: Standard pharmacy container. Clearly labeled. Filled Date: 03 / 26 / 2019 Last Medication intake:  09/08/2017 Pharmacokinetics: Liberation and absorption (onset of action): WNL Distribution (time to peak effect): WNL Metabolism  and excretion (duration of action):  WNL         Pharmacodynamics: Desired effects: Analgesia: [name] reports >50% benefit. Functional ability: Patient reports that medication allows her to accomplish basic ADLs Clinically meaningful improvement in function (CMIF): Sustained CMIF goals met Perceived effectiveness: Described as relatively effective, allowing for increase in activities of daily living (ADL) Undesirable effects: Side-effects or Adverse reactions: None reported Monitoring: [place] PMP: Online review of the past 52-monthperiod conducted. Compliant with practice rules and regulations Last UDS on record: Summary  Date Value Ref Range Status  06/27/2017 FINAL  Final    Comment:    ==================================================================== TOXASSURE SELECT 13 (MW) ==================================================================== Test                             Result       Flag       Units Drug Present and Declared for Prescription Verification   Oxycodone                      >7299        EXPECTED   ng/mg creat   Oxymorphone                    4171         EXPECTED   ng/mg creat   Noroxycodone                   >7299        EXPECTED   ng/mg creat   Noroxymorphone                 1593         EXPECTED   ng/mg creat    Sources of oxycodone are scheduled prescription medications.    Oxymorphone, noroxycodone, and noroxymorphone are expected    metabolites of oxycodone. Oxymorphone is also available as a    scheduled prescription medication. Drug Absent but Declared for Prescription Verification   Temazepam                      Not Detected UNEXPECTED ng/mg creat ==================================================================== Test                      Result    Flag   Units      Ref Range   Creatinine              137              mg/dL      >=20 ==================================================================== Declared Medications:  The flagging and interpretation on this report are based  on the  following declared medications.  Unexpected results may arise from  inaccuracies in the declared medications.  **Note: The testing scope of this panel includes these medications:  Oxycodone (Oxycodone Acetaminophen)  Temazepam  **Note: The testing scope of this panel does not include following  reported medications:  Acetaminophen  Acetaminophen (Oxycodone Acetaminophen)  Metoprolol  Ranitidine ==================================================================== For clinical consultation, please call ([phone]====    UDS interpretation: Compliant          Medication Assessment Form: Reviewed. Patient indicates being compliant with therapy Treatment compliance: Compliant Risk Assessment Profile: Aberrant behavior: See prior evaluations. None observed or detected today Comorbid factors increasing risk of overdose: See prior notes. No additional risks detected today Risk of substance use disorder (SUD): Low Opioid  Risk Tool - 09/10/17 1320      Family History of Substance Abuse   Alcohol  Negative    Illegal Drugs  Negative    Rx Drugs  Negative      Personal History of Substance Abuse   Alcohol  Negative    Illegal Drugs  Negative    Rx Drugs  Negative      Age   Age between 20-45 years   No      History of Preadolescent Sexual Abuse   History of Preadolescent Sexual Abuse  Negative or Female      Psychological Disease   Psychological Disease  Negative    Depression  Negative      Total Score   Opioid Risk Tool Scoring  0    Opioid Risk Interpretation  Low Risk      ORT Scoring interpretation table:  Score <3 = Low Risk for SUD  Score between 4-7 = Moderate Risk for SUD  Score >8 = High Risk for Opioid Abuse   Risk Mitigation Strategies:  Patient Counseling: Covered Patient-Prescriber Agreement (PPA): Present and active  Notification to other healthcare providers:  Done  Pharmacologic Plan: No change in therapy, at this time.             Laboratory Chemistry  Inflammation Markers (CRP: Acute Phase) (ESR: Chronic Phase) No results found for: CRP, ESRSEDRATE, LATICACIDVEN                       Rheumatology Markers No results found for: RF, ANA, Therisa Doyne, [facility]                      Renal Function Markers Lab Results  Component Value Date   BUN 11 07/19/2017   CREATININE 0.83 07/19/2017   GFRAA >60 07/19/2017   GFRNONAA >60 07/19/2017                              Hepatic Function Markers Lab Results  Component Value Date   AST 17 03/24/2013   ALT 8 03/24/2013   ALBUMIN 3.6 03/24/2013   ALKPHOS 90 03/24/2013   LIPASE 25 03/24/2013                        Electrolytes Lab Results  Component Value Date   NA 138 07/19/2017   K 3.1 (L) 07/19/2017   CL 105 07/19/2017   CALCIUM 8.0 (L) 07/19/2017                        Neuropathy Markers No results found for: VITAMINB12, FOLATE, HGBA1C, HIV                      Bone Pathology Markers No results found for: VD25OH, VD125OH2TOT, [id number], [id number], 25OHVITD1, 25OHVITD2, 25OHVITD3, TESTOFREE, TESTOSTERONE                       Coagulation Parameters Lab Results  Component Value Date   PLT 288 07/19/2017                        Cardiovascular Markers Lab Results  Component Value Date   HGB 12.1 07/19/2017   HCT 37.3 07/19/2017  CA Markers No results found for: CEA, CA125, LABCA2                      Note: Lab results reviewed.  Recent Diagnostic Imaging Results  DG Chest 2 View CLINICAL DATA:  47 year old female with flu like symptoms for 1 week. Vomiting for 2 days. Cough and shortness of breath.  EXAM: CHEST - 2 VIEW  COMPARISON:  Chest radiographs 07/29/2012. Thoracic spine radiographs 06/22/2013.  FINDINGS: Confluent abnormal airspace opacity in the posterior right upper lobe on both views. The lungs were clear on  prior studies. Superimposed widespread mild pulmonary interstitial opacity. Mediastinal contours remain normal. Visualized tracheal air column is within normal limits. No pneumothorax or pleural effusion. No other confluent pulmonary opacity. No acute osseous abnormality identified. Negative visible bowel gas pattern. Stable cholecystectomy clips.  IMPRESSION: 1. Right Upper Lobe Pneumonia superimposed on mild diffuse pulmonary interstitial opacity. Consider viral/atypical etiology. 2. No pleural effusion. 3. Followup PA and lateral chest X-ray is recommended in 3-4 weeks following trial of antibiotic therapy to ensure resolution and exclude underlying malignancy.  Electronically Signed   By: Genevie Ann M.D.   On: 07/19/2017 11:25  Complexity Note: Imaging results reviewed. Results shared with Ms. Sharen Counter, using Layman's terms.                         Meds   Current Outpatient Medications:  .  acetaminophen (TYLENOL) 500 MG tablet, Take 1,000 mg by mouth every 8 (eight) hours as needed for mild pain, moderate pain, fever or headache. , Disp: , Rfl:  .  metoprolol succinate (TOPROL-XL) 100 MG 24 hr tablet, Take 100 mg by mouth daily. Take with or immediately following a meal., Disp: , Rfl:  .  oxyCODONE-acetaminophen (PERCOCET/ROXICET) 5-325 MG tablet, Take 1 tablet by mouth 2 (two) times daily as needed for severe pain. For chronic pain.  To last for 30 days from fill date. To fill on or after: 09/10/17, 10/09/17, Disp: 60 tablet, Rfl: 0 .  RaNITidine HCl (ZANTAC PO), Take 1 tablet by mouth 3 (three) times daily. , Disp: , Rfl:  .  temazepam (RESTORIL) 30 MG capsule, Take 30 mg by mouth at bedtime as needed for sleep. , Disp: , Rfl:   ROS  Constitutional: Denies any fever or chills Gastrointestinal: No reported hemesis, hematochezia, vomiting, or acute GI distress Musculoskeletal: Denies any acute onset joint swelling, redness, loss of ROM, or weakness Neurological: No reported episodes  of acute onset apraxia, aphasia, dysarthria, agnosia, amnesia, paralysis, loss of coordination, or loss of consciousness  Allergies  Ms. Trovato is allergic to buprenorphine; cefaclor; gabapentin; ibuprofen; levofloxacin; macrobid [nitrofurantoin monohyd macro]; naproxen; and penicillins.  [place]  Drug: Ms. Igarashi  reports that she does not use drugs. Alcohol:  reports that she does not drink alcohol. Tobacco:  reports that she has been smoking cigarettes.  She has a 20.00 pack-year smoking history. She has never used smokeless tobacco. Medical:  has a past medical history of Chronic back pain, Hypertension, and Migraine. Surgical: Ms. Gressman  has a past surgical history that includes Abdominal hysterectomy; Cholecystectomy; and Hand surgery. Family: family history includes Cancer in her other; Diabetes in her other; Seizures in her other; Stroke in her other.  Constitutional Exam  General appearance: Well nourished, well developed, and well hydrated. In no apparent acute distress Vitals:   09/10/17 1311  BP: (!) 149/82  Pulse: 83  Resp: 18  Temp: 97.9 F (36.6 C)  TempSrc: Oral  SpO2: 100%  Weight: 115 lb (52.2 kg)  Height: '5\' 1"'$  (1.549 m)   BMI Assessment: Estimated body mass index is 21.73 kg/m as calculated from the following:   Height as of this encounter: '5\' 1"'$  (1.549 m).   Weight as of this encounter: 115 lb (52.2 kg).  BMI interpretation table: BMI level Category Range association with higher incidence of chronic pain  <18 kg/m2 Underweight   18.5-24.9 kg/m2 Ideal body weight   25-29.9 kg/m2 Overweight Increased incidence by 20%  30-34.9 kg/m2 Obese (Class I) Increased incidence by 68%  35-39.9 kg/m2 Severe obesity (Class II) Increased incidence by 136%  >40 kg/m2 Extreme obesity (Class III) Increased incidence by 254%   Patient's current BMI Ideal Body weight  Body mass index is 21.73 kg/m. Ideal body weight: 47.8 kg (105 lb 6.1 oz) Adjusted ideal body weight: 49.5 kg  (109 lb 3.7 oz)   BMI Readings from Last 4 Encounters:  09/10/17 21.73 kg/m  08/05/17 22.67 kg/m  07/19/17 22.67 kg/m  06/27/17 22.67 kg/m   Wt Readings from Last 4 Encounters:  09/10/17 115 lb (52.2 kg)  08/05/17 120 lb (54.4 kg)  07/19/17 120 lb (54.4 kg)  06/27/17 120 lb (54.4 kg)  Psych/Mental status: Alert, oriented x 3 (person, place, & time)       Eyes: PERLA Respiratory: No evidence of acute respiratory distress  Cervical Spine Area Exam  Skin & Axial Inspection: No masses, redness, edema, swelling, or associated skin lesions Alignment: Symmetrical Functional ROM: Unrestricted ROM      Stability: No instability detected Muscle Tone/Strength: Functionally intact. No obvious neuro-muscular anomalies detected. Sensory (Neurological): Unimpaired Palpation: No palpable anomalies              Upper Extremity (UE) Exam    Side: Right upper extremity  Side: Left upper extremity  Skin & Extremity Inspection: Skin color, temperature, and hair growth are WNL. No peripheral edema or cyanosis. No masses, redness, swelling, asymmetry, or associated skin lesions. No contractures.  Skin & Extremity Inspection: Skin color, temperature, and hair growth are WNL. No peripheral edema or cyanosis. No masses, redness, swelling, asymmetry, or associated skin lesions. No contractures.  Functional ROM: Unrestricted ROM          Functional ROM: Unrestricted ROM          Muscle Tone/Strength: Functionally intact. No obvious neuro-muscular anomalies detected.  Muscle Tone/Strength: Functionally intact. No obvious neuro-muscular anomalies detected.  Sensory (Neurological): Unimpaired          Sensory (Neurological): Unimpaired          Palpation: No palpable anomalies              Palpation: No palpable anomalies              Specialized Test(s): Deferred         Specialized Test(s): Deferred          Thoracic Spine Area Exam  Skin & Axial Inspection: No masses, redness, or swelling Alignment:  Symmetrical Functional ROM: Unrestricted ROM Stability: No instability detected Muscle Tone/Strength: Functionally intact. No obvious neuro-muscular anomalies detected. Sensory (Neurological): Unimpaired Muscle strength & Tone: No palpable anomalies  Lumbar Spine Area Exam  Skin & Axial Inspection: No masses, redness, or swelling Alignment: Symmetrical Functional ROM: Unrestricted ROM       Stability: No instability detected Muscle Tone/Strength: Functionally intact. No obvious neuro-muscular anomalies detected. Sensory (Neurological): Unimpaired Palpation: No palpable  anomalies       Provocative Tests: Lumbar Hyperextension and rotation test: evaluation deferred today       Lumbar Lateral bending test: evaluation deferred today       Patrick's Maneuver: Positive for bilateral S-I arthralgia              Gait & Posture Assessment  Ambulation: Unassisted Gait: Relatively normal for age and body habitus Posture: WNL   Lower Extremity Exam    Side: Right lower extremity  Side: Left lower extremity  Skin & Extremity Inspection: Skin color, temperature, and hair growth are WNL. No peripheral edema or cyanosis. No masses, redness, swelling, asymmetry, or associated skin lesions. No contractures.  Skin & Extremity Inspection: Skin color, temperature, and hair growth are WNL. No peripheral edema or cyanosis. No masses, redness, swelling, asymmetry, or associated skin lesions. No contractures.  Functional ROM: Unrestricted ROM          Functional ROM: Unrestricted ROM          Muscle Tone/Strength: Functionally intact. No obvious neuro-muscular anomalies detected.  Muscle Tone/Strength: Functionally intact. No obvious neuro-muscular anomalies detected.  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 Diagnoses of Lumbar degenerative disc disease, Chronic pain syndrome, Chronic migraine without aura without status migrainosus, not intractable, Lumbar radiculopathy, Chronic left-sided low back pain with left-sided sciatica, Lumbar spondylosis, and Lumbar sprain, initial encounter were also pertinent to this visit.  Status Diagnosis  Worsening Persistent Persistent 1. SI joint arthritis   2. Lumbar degenerative disc disease   3. Chronic pain syndrome   4. Chronic migraine without aura without status migrainosus, not intractable   5. Lumbar radiculopathy   6. Chronic left-sided low back pain with left-sided sciatica   7. Lumbar spondylosis   8. Lumbar sprain, initial encounter      General Recommendations: The pain condition that the patient suffers from is best treated with a multidisciplinary approach that involves an increase in physical activity to prevent de-conditioning and worsening of the pain cycle, as well as psychological counseling (formal and/or informal) to address the co-morbid psychological affects of pain. Treatment will often involve judicious use of pain medications and interventional procedures to decrease the pain, allowing the patient to participate in the physical activity that will ultimately produce long-lasting pain reductions. The goal of the multidisciplinary approach is to return the patient to a higher level of overall function and to restore their ability to perform activities of daily living.  47 year old female who presents with axial low back and buttock pain that radiates into her groin secondary to SI joint arthritis.  Patient also has axial low back pain secondary to lumbar degenerative disc disease.  She is here for medication refill.  Patient has upcoming procedure: Bilateral SI joint injection on May 8 for SI joint arthritis.  We will refill patient's Percocet at his previous dose below. Rx x 2 months.   Plan of Care  Pharmacotherapy  (Medications Ordered): Meds ordered this encounter  Medications  . DISCONTD: oxyCODONE-acetaminophen (PERCOCET/ROXICET) 5-325 MG tablet    Sig: Take 1 tablet by mouth 2 (two) times daily as needed for severe pain. For chronic pain.  To last for 30 days from fill date. To fill on or after: 09/10/17, 10/09/17    Dispense:  60 tablet    Refill:  0  .  oxyCODONE-acetaminophen (PERCOCET/ROXICET) 5-325 MG tablet    Sig: Take 1 tablet by mouth 2 (two) times daily as needed for severe pain. For chronic pain.  To last for 30 days from fill date. To fill on or after: 09/10/17, 10/09/17    Dispense:  60 tablet    Refill:  0    Provider-requested follow-up: Return in about 8 weeks (around 11/05/2017). Time Note: Greater than 50% of the 25 minute(s) of face-to-face time spent with Ms. Bassford, was spent in counseling/coordination of care regarding: Ms. Rauda primary cause of pain, the treatment plan, treatment alternatives, medication side effects, the opioid analgesic risks and possible complications, the appropriate use of her medications, realistic expectations and the goals of pain management (increased in functionality).  Future Appointments  Date Time Provider [place]  09/18/2017  9:30 AM Gillis Santa, MD [facility] None  10/29/2017 11:45 AM Gillis Santa, MD [facility] None    Primary Care Physician: [facility] Location: [facility] Outpatient Pain Management Facility Note by: Gillis Santa, M.D Date: 09/10/2017; Time: 2:58 PM  Patient Instructions  ____________________________________________________________________________________________  Preparing for Procedure with Sedation  Instructions: . Oral Intake: Do not eat or drink anything for at least 8 hours prior to your procedure. . Transportation: Public transportation is not allowed. Bring an adult driver. The driver must be physically present in our waiting room before any procedure can be started. Marland Kitchen Physical  Assistance: Bring an adult physically capable of assisting you, in the event you need help. This adult should keep you company at home for at least 6 hours after the procedure. . Blood Pressure Medicine: Take your blood pressure medicine with a sip of water the morning of the procedure. . Blood thinners:  . Diabetics on insulin: Notify the staff so that you can be scheduled 1st case in the morning. If your diabetes requires high dose insulin, take only  of your normal insulin dose the morning of the procedure and notify the staff that you have done so. . Preventing infections: Shower with an antibacterial soap the morning of your procedure. . Build-up your immune system: Take 1000 mg of Vitamin C with every meal (3 times a day) the day prior to your procedure. Marland Kitchen Antibiotics: Inform the staff if you have a condition or reason that requires you to take antibiotics before dental procedures. . Pregnancy: If you are pregnant, call and cancel the procedure. . Sickness: If you have a cold, fever, or any active infections, call and cancel the procedure. . Arrival: You must be in the facility at least 30 minutes prior to your scheduled procedure. . Children: Do not bring children with you. . Dress appropriately: Bring dark clothing that you would not mind if they get stained. . Valuables: Do not bring any jewelry or valuables.  Procedure appointments are reserved for interventional treatments only. Marland Kitchen No Prescription Refills. . No medication changes will be discussed during procedure appointments. . No disability issues will be discussed.  Remember:  Regular Business hours are:  Monday to Thursday 8:00 AM to 4:00 PM  Provider's Schedule: Milinda Pointer, MD:  Procedure days: Tuesday and Thursday 7:30 AM to 4:00 PM  Gillis Santa, MD:  Procedure days: Monday and Wednesday 7:30 AM to 4:00 PM ____________________________________________________________________________________________

## 2017-09-18 ENCOUNTER — Ambulatory Visit: Payer: BLUE CROSS/BLUE SHIELD | Admitting: Student in an Organized Health Care Education/Training Program

## 2017-09-23 ENCOUNTER — Encounter: Payer: Self-pay | Admitting: Student in an Organized Health Care Education/Training Program

## 2017-09-23 ENCOUNTER — Ambulatory Visit
Admission: RE | Admit: 2017-09-23 | Discharge: 2017-09-23 | Disposition: A | Discharge status: Home / Self Care | Payer: BLUE CROSS/BLUE SHIELD | Source: Ambulatory Visit | Attending: Student in an Organized Health Care Education/Training Program | Admitting: Student in an Organized Health Care Education/Training Program

## 2017-09-23 ENCOUNTER — Other Ambulatory Visit: Payer: Self-pay

## 2017-09-23 ENCOUNTER — Ambulatory Visit (HOSPITAL_BASED_OUTPATIENT_CLINIC_OR_DEPARTMENT_OTHER): Payer: BLUE CROSS/BLUE SHIELD | Admitting: Student in an Organized Health Care Education/Training Program

## 2017-09-23 VITALS — BP 133/71 | HR 61 | Temp 97.4°F | Resp 20 | Ht 61.0 in | Wt 115.0 lb

## 2017-09-23 DIAGNOSIS — Z880 Allergy status to penicillin: Secondary | ICD-10-CM | POA: Diagnosis not present

## 2017-09-23 DIAGNOSIS — M549 Dorsalgia, unspecified: Secondary | ICD-10-CM | POA: Insufficient documentation

## 2017-09-23 DIAGNOSIS — Z888 Allergy status to other drugs, medicaments and biological substances status: Secondary | ICD-10-CM | POA: Insufficient documentation

## 2017-09-23 DIAGNOSIS — M461 Sacroiliitis, not elsewhere classified: Secondary | ICD-10-CM | POA: Insufficient documentation

## 2017-09-23 DIAGNOSIS — M47818 Spondylosis without myelopathy or radiculopathy, sacral and sacrococcygeal region: Secondary | ICD-10-CM

## 2017-09-23 DIAGNOSIS — Z881 Allergy status to other antibiotic agents status: Secondary | ICD-10-CM | POA: Diagnosis not present

## 2017-09-23 DIAGNOSIS — Z886 Allergy status to analgesic agent status: Secondary | ICD-10-CM | POA: Diagnosis not present

## 2017-09-23 DIAGNOSIS — M79605 Pain in left leg: Secondary | ICD-10-CM | POA: Diagnosis present

## 2017-09-23 MED ORDER — LACTATED RINGERS IV SOLN
1000.0000 mL | Freq: Once | INTRAVENOUS | Status: AC
  Administered 2017-09-23: 1000 mL via INTRAVENOUS

## 2017-09-23 MED ORDER — DEXAMETHASONE SODIUM PHOSPHATE 10 MG/ML IJ SOLN
10.0000 mg | Freq: Once | INTRAMUSCULAR | Status: AC
  Administered 2017-09-23: 10 mg
  Filled 2017-09-23: qty 1

## 2017-09-23 MED ORDER — LIDOCAINE HCL 1 % IJ SOLN
10.0000 mL | Freq: Once | INTRAMUSCULAR | Status: AC
  Administered 2017-09-23: 5 mL
  Filled 2017-09-23: qty 10

## 2017-09-23 MED ORDER — FENTANYL CITRATE (PF) 100 MCG/2ML IJ SOLN
25.0000 ug | INTRAMUSCULAR | Status: DC | PRN
  Administered 2017-09-23: 50 ug via INTRAVENOUS
  Filled 2017-09-23: qty 2

## 2017-09-23 MED ORDER — ROPIVACAINE HCL 2 MG/ML IJ SOLN
10.0000 mL | Freq: Once | INTRAMUSCULAR | Status: AC
  Administered 2017-09-23: 10 mL
  Filled 2017-09-23: qty 10

## 2017-09-23 NOTE — Progress Notes (Signed)
Safety precautions to be maintained throughout the outpatient stay will include: orient to surroundings, keep bed in low position, maintain call bell within reach at all times, provide assistance with transfer out of bed and ambulation.  

## 2017-09-23 NOTE — Progress Notes (Signed)
Patient's Name: [name]  MRN: [id number]  Referring Provider: [facility]  DOB: [birth year]  PCP: [facility]  DOS: 09/23/2017  Note by: Gillis Santa, MD  Service setting: Ambulatory outpatient  Specialty: Interventional Pain Management  Patient type: Established  Location: [facility] (AMB) Pain Management Facility  Visit type: Interventional Procedure   Primary Reason for Visit: Interventional Pain Management Treatment. CC: Procedure (Bilateral SI joint injection ); Back Pain; and Leg Pain (left leg )  Procedure:  Anesthesia, Analgesia, Anxiolysis:  Type: Diagnostic Sacroiliac Joint Steroid Injection          Region: Inferior Lumbosacral Region Level: PIIS (Posterior Inferior Iliac Spine) Laterality: Bilateral  Type: Moderate (Conscious) Sedation combined with Local Anesthesia Indication(s): Analgesia and Anxiety Route: Intravenous (IV) IV Access: Secured Sedation: Meaningful verbal contact was maintained at all times during the procedure  Local Anesthetic: Lidocaine 1%   Indications: 1. SI joint arthritis    Pain Score: Pre-procedure: 8 /10 Post-procedure: 0-No pain/10  Pre-op Assessment:  [name] is a 47 y.o. (year old), female patient, seen today for interventional treatment. She  has a past surgical history that includes Abdominal hysterectomy; Cholecystectomy; and Hand surgery. [name] has a current medication list which includes the following prescription(s): acetaminophen, metoprolol succinate, oxycodone-acetaminophen, ranitidine hcl, and temazepam, and the following Facility-Administered Medications: fentanyl. Her primarily concern today is the Procedure (Bilateral SI joint injection ); Back Pain; and Leg Pain (left leg )  Initial Vital Signs:  Pulse/HCG Rate: 66ECG Heart Rate: 61 Temp: 98 F (36.7 C) Resp: 18 BP: 129/72 SpO2: 100 %  BMI: Estimated body mass index is 21.73 kg/m as calculated from the following:   Height as of  this encounter: 5\' 1"  (1.549 m).   Weight as of this encounter: 115 lb (52.2 kg).  Risk Assessment: Allergies: Reviewed. She is allergic to buprenorphine; cefaclor; gabapentin; ibuprofen; levofloxacin; macrobid [nitrofurantoin monohyd macro]; naproxen; and penicillins.  Allergy Precautions: None required Coagulopathies: Reviewed. None identified.  Blood-thinner therapy: None at this time Active Infection(s): Reviewed. None identified. [name] is afebrile  Site Confirmation: [name] was asked to confirm the procedure and laterality before marking the site Procedure checklist: Completed Consent: Before the procedure and under the influence of no sedative(s), amnesic(s), or anxiolytics, the patient was informed of the treatment options, risks and possible complications. To fulfill our ethical and legal obligations, as recommended by the American Medical Association's Code of Ethics, I have informed the patient of my clinical impression; the nature and purpose of the treatment or procedure; the risks, benefits, and possible complications of the intervention; the alternatives, including doing nothing; the risk(s) and benefit(s) of the alternative treatment(s) or procedure(s); and the risk(s) and benefit(s) of doing nothing. The patient was provided information about the general risks and possible complications associated with the procedure. These may include, but are not limited to: failure to achieve desired goals, infection, bleeding, organ or nerve damage, allergic reactions, paralysis, and death. In addition, the patient was informed of those risks and complications associated to the procedure, such as failure to decrease pain; infection; bleeding; organ or nerve damage with subsequent damage to sensory, motor, and/or autonomic systems, resulting in permanent pain, numbness, and/or weakness of one or several areas of the body; allergic reactions; (i.e.: anaphylactic reaction); and/or  death. Furthermore, the patient was informed of those risks and complications associated with the medications. These include, but are not limited to: allergic reactions (i.e.: anaphylactic or anaphylactoid reaction(s));  adrenal axis suppression; blood sugar elevation that in diabetics may result in ketoacidosis or comma; water retention that in patients with history of congestive heart failure may result in shortness of breath, pulmonary edema, and decompensation with resultant heart failure; weight gain; swelling or edema; medication-induced neural toxicity; particulate matter embolism and blood vessel occlusion with resultant organ, and/or nervous system infarction; and/or aseptic necrosis of one or more joints. Finally, the patient was informed that Medicine is not an exact science; therefore, there is also the possibility of unforeseen or unpredictable risks and/or possible complications that may result in a catastrophic outcome. The patient indicated having understood very clearly. We have given the patient no guarantees and we have made no promises. Enough time was given to the patient to ask questions, all of which were answered to the patient's satisfaction. [name] has indicated that she wanted to continue with the procedure. Attestation: I, the ordering provider, attest that I have discussed with the patient the benefits, risks, side-effects, alternatives, likelihood of achieving goals, and potential problems during recovery for the procedure that I have provided informed consent. Date  Time: 09/23/2017  8:42 AM  Pre-Procedure Preparation:  Monitoring: As per clinic protocol. Respiration, ETCO2, SpO2, BP, heart rate and rhythm monitor placed and checked for adequate function Safety Precautions: Patient was assessed for positional comfort and pressure points before starting the procedure. Time-out: I initiated and conducted the "Time-out" before starting the procedure, as per protocol. The  patient was asked to participate by confirming the accuracy of the "Time Out" information. Verification of the correct person, site, and procedure were performed and confirmed by me, the nursing staff, and the patient. "Time-out" conducted as per Joint Commission's Universal Protocol (UP.01.01.01). Time: 1000  Description of Procedure:       Position: Prone Target Area: Inferior, posterior, aspect of the sacroiliac fissure Approach: Posterior, paraspinal, ipsilateral approach. Area Prepped: Entire Lower Lumbosacral Region Prepping solution: ChloraPrep (2% chlorhexidine gluconate and 70% isopropyl alcohol) Safety Precautions: Aspiration looking for blood return was conducted prior to all injections. At no point did we inject any substances, as a needle was being advanced. No attempts were made at seeking any paresthesias. Safe injection practices and needle disposal techniques used. Medications properly checked for expiration dates. SDV (single dose vial) medications used. Description of the Procedure: Protocol guidelines were followed. The patient was placed in position over the procedure table. The target area was identified and the area prepped in the usual manner. Skin & deeper tissues infiltrated with local anesthetic. Appropriate amount of time allowed to pass for local anesthetics to take effect. The procedure needle was advanced under fluoroscopic guidance into the sacroiliac joint until a firm endpoint was obtained. Proper needle placement secured. Negative aspiration confirmed. Solution injected in intermittent fashion, asking for systemic symptoms every 0.5cc of injectate. The needles were then removed and the area cleansed, making sure to leave some of the prepping solution back to take advantage of its long term bactericidal properties. Vitals:   09/23/17 1010 09/23/17 1020 09/23/17 1030 09/23/17 1043  BP: (!) 158/86 137/69 139/81 133/71  Pulse: 65 61    Resp: 16 13 20 20   Temp:  97.8 F  (36.6 C)  (!) 97.4 F (36.3 C)  SpO2: 100% 100% 100% 100%  Weight:      Height:        Start Time: 1000 hrs. End Time: 1006 hrs. Materials:  Needle(s) Type: Regular needle Gauge: 22G Length: 3.5-in Medication(s): Please see orders for  medications and dosing details. 10 cc solution made of 9 cc of 0.2% ropivacaine, 1 cc of Decadron 10 mg/cc.  2.5 cc injected intra-articular, 2.5 cc injected periarticular on each side. Imaging Guidance (Non-Spinal):  Type of Imaging Technique: Fluoroscopy Guidance (Non-Spinal) Indication(s): Assistance in needle guidance and placement for procedures requiring needle placement in or near specific anatomical locations not easily accessible without such assistance. Exposure Time: Please see nurses notes. Contrast: Before injecting any contrast, we confirmed that the patient did not have an allergy to iodine, shellfish, or radiological contrast. Once satisfactory needle placement was completed at the desired level, radiological contrast was injected. Contrast injected under live fluoroscopy. No contrast complications. See chart for type and volume of contrast used. Fluoroscopic Guidance: I was personally present during the use of fluoroscopy. "Tunnel Vision Technique" used to obtain the best possible view of the target area. Parallax error corrected before commencing the procedure. "Direction-depth-direction" technique used to introduce the needle under continuous pulsed fluoroscopy. Once target was reached, antero-posterior, oblique, and lateral fluoroscopic projection used confirm needle placement in all planes. Images permanently stored in EMR. Interpretation: I personally interpreted the imaging intraoperatively. Adequate needle placement confirmed in multiple planes. Appropriate spread of contrast into desired area was observed. No evidence of afferent or efferent intravascular uptake. Permanent images saved into the patient's record.  Antibiotic Prophylaxis:    Anti-infectives (From admission, onward)   None     Indication(s): None identified  Post-operative Assessment:  Post-procedure Vital Signs:  Pulse/HCG Rate: 6172 Temp: (!) 97.4 F (36.3 C) Resp: 20 BP: 133/71 SpO2: 100 %  EBL: None  Complications: No immediate post-treatment complications observed by team, or reported by patient.  Note: The patient tolerated the entire procedure well. A repeat set of vitals were taken after the procedure and the patient was kept under observation following institutional policy, for this type of procedure. Post-procedural neurological assessment was performed, showing return to baseline, prior to discharge. The patient was provided with post-procedure discharge instructions, including a section on how to identify potential problems. Should any problems arise concerning this procedure, the patient was given instructions to immediately contact us, at any time, without hesitation. In any case, we plan to contact the patient by telephone for a follow-up status report regarding this interventional procedure.  Comments:  No additional relevant information. 5 out of 5 strength bilateral lower extremity: Plantar flexion, dorsiflexion, knee flexion, knee extension.  Plan of Care    Imaging Orders     DG C-Arm 1-60 Min-No Report Procedure Orders    No procedure(s) ordered today    Medications ordered for procedure: Meds ordered this encounter  Medications  . lactated ringers infusion 1,000 mL  . fentaNYL (SUBLIMAZE) injection 25-100 mcg    Make sure Narcan is available in the pyxis when using this medication. In the event of respiratory depression (RR< 8/min): Titrate NARCAN (naloxone) in increments of 0.1 to 0.2 mg IV at 2-3 minute intervals, until desired degree of reversal.  . lidocaine (XYLOCAINE) 1 % (with pres) injection 10 mL  . ropivacaine (PF) 2 mg/mL (0.2%) (NAROPIN) injection 10 mL  . dexamethasone (DECADRON) injection 10 mg    Medications administered: We administered lactated ringers, fentaNYL, lidocaine, ropivacaine (PF) 2 mg/mL (0.2%), and dexamethasone.  See the medical record for exact dosing, route, and time of administration.  New Prescriptions   No medications on file   Disposition: Discharge home  Discharge Date & Time: 09/23/2017; 1044 hrs.   Physician-requested Follow-up: Return in about  1 month (around 10/21/2017) for Medication Management, Post Procedure Evaluation.  Future Appointments  Date Time Provider [place]  10/29/2017 11:45 AM Gillis Santa, MD [facility] None   Primary Care Physician: [facility] Location: [facility] Out[facility] Facility Note by: Gillis Santa, MD Date: 09/23/2017; Time: 1:13 PM  Disclaimer:  Medicine is not an Chief Strategy Officer. The only guarantee in medicine is that nothing is guaranteed. It is important to note that the decision to proceed with this intervention was based on the information collected from the patient. The Data and conclusions were drawn from the patient's questionnaire, the interview, and the physical examination. Because the information was provided in large part by the patient, it cannot be guaranteed that it has not been purposely or unconsciously manipulated. Every effort has been made to obtain as much relevant data as possible for this evaluation. It is important to note that the conclusions that lead to this procedure are derived in large part from the available data. Always take into account that the treatment will also be dependent on availability of resources and existing treatment guidelines, considered by other Pain Management Practitioners as being common knowledge and practice, at the time of the intervention. For Medico-Legal purposes, it is also important to point out that variation in procedural techniques and pharmacological choices are the acceptable norm. The indications, contraindications,  technique, and results of the above procedure should only be interpreted and judged by a Board-Certified Interventional Pain Specialist with extensive familiarity and expertise in the same exact procedure and technique.

## 2017-09-24 ENCOUNTER — Telehealth: Payer: Self-pay

## 2017-09-24 NOTE — Telephone Encounter (Signed)
Post procedure phone call.  Left message.  

## 2017-10-29 ENCOUNTER — Encounter: Payer: BLUE CROSS/BLUE SHIELD | Admitting: Student in an Organized Health Care Education/Training Program

## 2017-11-21 ENCOUNTER — Ambulatory Visit
Payer: Self-pay | Attending: Student in an Organized Health Care Education/Training Program | Admitting: Student in an Organized Health Care Education/Training Program

## 2017-11-21 ENCOUNTER — Other Ambulatory Visit: Payer: Self-pay

## 2017-11-21 ENCOUNTER — Encounter: Payer: Self-pay | Admitting: Student in an Organized Health Care Education/Training Program

## 2017-11-21 VITALS — BP 131/61 | HR 66 | Temp 98.1°F | Resp 16 | Ht 61.0 in | Wt 115.0 lb

## 2017-11-21 DIAGNOSIS — Z90710 Acquired absence of both cervix and uterus: Secondary | ICD-10-CM | POA: Insufficient documentation

## 2017-11-21 DIAGNOSIS — M5116 Intervertebral disc disorders with radiculopathy, lumbar region: Secondary | ICD-10-CM | POA: Insufficient documentation

## 2017-11-21 DIAGNOSIS — Z79891 Long term (current) use of opiate analgesic: Secondary | ICD-10-CM | POA: Insufficient documentation

## 2017-11-21 DIAGNOSIS — Z9049 Acquired absence of other specified parts of digestive tract: Secondary | ICD-10-CM | POA: Insufficient documentation

## 2017-11-21 DIAGNOSIS — M47816 Spondylosis without myelopathy or radiculopathy, lumbar region: Secondary | ICD-10-CM

## 2017-11-21 DIAGNOSIS — M5416 Radiculopathy, lumbar region: Secondary | ICD-10-CM

## 2017-11-21 DIAGNOSIS — M79604 Pain in right leg: Secondary | ICD-10-CM | POA: Insufficient documentation

## 2017-11-21 DIAGNOSIS — M79605 Pain in left leg: Secondary | ICD-10-CM | POA: Insufficient documentation

## 2017-11-21 DIAGNOSIS — G43709 Chronic migraine without aura, not intractable, without status migrainosus: Secondary | ICD-10-CM | POA: Insufficient documentation

## 2017-11-21 DIAGNOSIS — M5136 Other intervertebral disc degeneration, lumbar region: Secondary | ICD-10-CM

## 2017-11-21 DIAGNOSIS — M4726 Other spondylosis with radiculopathy, lumbar region: Secondary | ICD-10-CM | POA: Insufficient documentation

## 2017-11-21 DIAGNOSIS — Z886 Allergy status to analgesic agent status: Secondary | ICD-10-CM | POA: Insufficient documentation

## 2017-11-21 DIAGNOSIS — Z5181 Encounter for therapeutic drug level monitoring: Secondary | ICD-10-CM | POA: Insufficient documentation

## 2017-11-21 DIAGNOSIS — F17210 Nicotine dependence, cigarettes, uncomplicated: Secondary | ICD-10-CM | POA: Insufficient documentation

## 2017-11-21 DIAGNOSIS — G894 Chronic pain syndrome: Secondary | ICD-10-CM

## 2017-11-21 DIAGNOSIS — Z79899 Other long term (current) drug therapy: Secondary | ICD-10-CM | POA: Insufficient documentation

## 2017-11-21 DIAGNOSIS — Z888 Allergy status to other drugs, medicaments and biological substances status: Secondary | ICD-10-CM | POA: Insufficient documentation

## 2017-11-21 DIAGNOSIS — M545 Low back pain: Secondary | ICD-10-CM | POA: Insufficient documentation

## 2017-11-21 DIAGNOSIS — Z880 Allergy status to penicillin: Secondary | ICD-10-CM | POA: Insufficient documentation

## 2017-11-21 DIAGNOSIS — M47818 Spondylosis without myelopathy or radiculopathy, sacral and sacrococcygeal region: Secondary | ICD-10-CM

## 2017-11-21 DIAGNOSIS — Z881 Allergy status to other antibiotic agents status: Secondary | ICD-10-CM | POA: Insufficient documentation

## 2017-11-21 MED ORDER — OXYCODONE-ACETAMINOPHEN 5-325 MG PO TABS: 1.0000 | ORAL_TABLET | Freq: Two times a day (BID) | ORAL | 0 refills | Status: DC | PRN

## 2017-11-21 NOTE — Progress Notes (Deleted)
Patient's Name: [name]  MRN: [id number]  Referring Provider: [facility]  DOB: 02/12/1971  PCP: [facility]  DOS: 11/21/2017  Note by: Gillis Santa, MD  Service setting: Ambulatory outpatient  Specialty: Interventional Pain Management  Location: [facility] (AMB) Pain Management Facility    Patient type: Established   Primary Reason(s) for Visit: Encounter for post-procedure evaluation of chronic illness with mild to moderate exacerbation CC: Back Pain (lower) and Leg Pain (bilaterally, left is worse)  HPI  [name] is a 47 y.o. year old, female patient, who comes today for a post-procedure evaluation. She has Lumbar radiculopathy; Lumbar degenerative disc disease; Lumbar spondylosis; Chronic low back pain with bilateral sciatica; Chronic pain syndrome; Lumbar sprain; and Chronic migraine without aura without status migrainosus, not intractable on their problem list. Her primarily concern today is the Back Pain (lower) and Leg Pain (bilaterally, left is worse)  Pain Assessment: Location: Lower Back Radiating: down back of left leg to big toe, "like an electrical current". Onset: More than a month ago Duration: Chronic pain Quality: Constant, Pounding, Stabbing Severity: 7 /10 (subjective, self-reported pain score)  Note: Reported level is compatible with observation.                         When using our objective Pain Scale, levels between 6 and 10/10 are said to belong in an emergency room, as it progressively worsens from a 6/10, described as severely limiting, requiring emergency care not usually available at an outpatient pain management facility. At a 6/10 level, communication becomes difficult and requires great effort. Assistance to reach the emergency department may be required. Facial flushing and profuse sweating along with potentially dangerous increases in heart rate and blood pressure will be evident. Effect on ADL: takes longer to perform  daily activities Timing: Constant Modifying factors: medications BP: 131/61  HR: 66  [name] comes in today for post-procedure evaluation after the treatment done on 09/24/2017.  Further details on both, my assessment(s), as well as the proposed treatment plan, please see below.  Post-Procedure Assessment  09/23/2017 Procedure: *** Pre-procedure pain score:        /10 Post-procedure pain score: 0/10         Influential Factors: BMI: 21.73 kg/m Intra-procedural challenges: None observed.         Assessment challenges: None detected.              Reported side-effects: None.        Post-procedural adverse reactions or complications: None reported         Sedation: Please see nurses note. When no sedatives are used, the analgesic levels obtained are directly associated to the effectiveness of the local anesthetics. However, when sedation is provided, the level of analgesia obtained during the initial 1 hour following the intervention, is believed to be the result of a combination of factors. These factors may include, but are not limited to: 1. The effectiveness of the local anesthetics used. 2. The effects of the analgesic(s) and/or anxiolytic(s) used. 3. The degree of discomfort experienced by the patient at the time of the procedure. 4. The patients ability and reliability in recalling and recording the events. 5. The presence and influence of possible secondary gains and/or psychosocial factors. Reported result: Relief experienced during the 1st hour after the procedure: 100 % (Ultra-Short Term Relief)            Interpretative annotation: Clinically  appropriate result. Analgesia during this period is likely to be Local Anesthetic and/or IV Sedative (Analgesic/Anxiolytic) related.          Effects of local anesthetic: The analgesic effects attained during this period are directly associated to the localized infiltration of local anesthetics and therefore cary significant diagnostic  value as to the etiological location, or anatomical origin, of the pain. Expected duration of relief is directly dependent on the pharmacodynamics of the local anesthetic used. Long-acting (4-6 hours) anesthetics used.  Reported result: Relief during the next 4 to 6 hour after the procedure: 100 % (Short-Term Relief)            Interpretative annotation: Clinically appropriate result. Analgesia during this period is likely to be Local Anesthetic-related.          Long-term benefit: Defined as the period of time past the expected duration of local anesthetics (1 hour for short-acting and 4-6 hours for long-acting). With the possible exception of prolonged sympathetic blockade from the local anesthetics, benefits during this period are typically attributed to, or associated with, other factors such as analgesic sensory neuropraxia, antiinflammatory effects, or beneficial biochemical changes provided by agents other than the local anesthetics.  Reported result: Extended relief following procedure: 0 % (Long-Term Relief)            Interpretative annotation: Unexpected result. No long-term benefit. No long-term benefit expected. Limited inflammation. Possible mechanical aggravating factors.          Current benefits: Defined as reported results that persistent at this point in time.   Analgesia: 0 %            Function: No benefit ROM: No benefit Interpretative annotation: Recurrence of symptoms. No permanent benefit expected. Effective diagnostic intervention.          Interpretation: Results would suggest a successful diagnostic intervention.                  Plan:  Please see "Plan of Care" for details.                Laboratory Chemistry  Inflammation Markers (CRP: Acute Phase) (ESR: Chronic Phase) No results found for: CRP, ESRSEDRATE, LATICACIDVEN                       Rheumatology Markers No results found for: RF, ANA, LABURIC, URICUR, LYMEIGGIGMAB, LYMEABIGMQN, HLAB27                       Renal Function Markers Lab Results  Component Value Date   BUN 11 07/19/2017   CREATININE 0.83 07/19/2017   GFRAA >60 07/19/2017   GFRNONAA >60 07/19/2017                             Hepatic Function Markers Lab Results  Component Value Date   AST 17 03/24/2013   ALT 8 03/24/2013   ALBUMIN 3.6 03/24/2013   ALKPHOS 90 03/24/2013   LIPASE 25 03/24/2013                        Electrolytes Lab Results  Component Value Date   NA 138 07/19/2017   K 3.1 (L) 07/19/2017   CL 105 07/19/2017   CALCIUM 8.0 (L) 07/19/2017  Neuropathy Markers No results found for: VITAMINB12, FOLATE, HGBA1C, HIV                      Bone Pathology Markers No results found for: VD25OH, [id number], [id number], LF8101BP1, 25OHVITD1, 25OHVITD2, 25OHVITD3, TESTOFREE, TESTOSTERONE                       Coagulation Parameters Lab Results  Component Value Date   PLT 288 07/19/2017                        Cardiovascular Markers Lab Results  Component Value Date   HGB 12.1 07/19/2017   HCT 37.3 07/19/2017                         CA Markers No results found for: CEA, CA125, LABCA2                      Note: Lab results reviewed.  Recent Diagnostic Imaging Results  DG C-Arm 1-60 Min-No Report Fluoroscopy was utilized by the requesting physician.  No radiographic  interpretation.   Complexity Note: Imaging results reviewed. Results shared with Ms. Sharen Counter, using Layman's terms.                         Meds   Current Outpatient Medications:  .  acetaminophen (TYLENOL) 500 MG tablet, Take 1,000 mg by mouth every 8 (eight) hours as needed for mild pain, moderate pain, fever or headache. , Disp: , Rfl:  .  metoprolol succinate (TOPROL-XL) 100 MG 24 hr tablet, Take 100 mg by mouth daily. Take with or immediately following a meal., Disp: , Rfl:  .  oxyCODONE-acetaminophen (PERCOCET/ROXICET) 5-325 MG tablet, Take 1 tablet by mouth 2 (two) times daily as needed for severe pain.  For chronic pain.  To last for 30 days from fill date. To fill on or after: 09/10/17, 10/09/17, Disp: 60 tablet, Rfl: 0 .  RaNITidine HCl (ZANTAC PO), Take 1 tablet by mouth 3 (three) times daily. , Disp: , Rfl:  .  temazepam (RESTORIL) 30 MG capsule, Take 30 mg by mouth at bedtime as needed for sleep. , Disp: , Rfl:   ROS  Constitutional: Denies any fever or chills Gastrointestinal: No reported hemesis, hematochezia, vomiting, or acute GI distress Musculoskeletal: Denies any acute onset joint swelling, redness, loss of ROM, or weakness Neurological: No reported episodes of acute onset apraxia, aphasia, dysarthria, agnosia, amnesia, paralysis, loss of coordination, or loss of consciousness  Allergies  Ms. Lindenbaum is allergic to buprenorphine; cefaclor; gabapentin; ibuprofen; levofloxacin; macrobid [nitrofurantoin monohyd macro]; naproxen; and penicillins.  [place]  Drug: Ms. Mallis  reports that she does not use drugs. Alcohol:  reports that she does not drink alcohol. Tobacco:  reports that she has been smoking cigarettes.  She has a 20.00 pack-year smoking history. She has never used smokeless tobacco. Medical:  has a past medical history of Chronic back pain, Hypertension, and Migraine. Surgical: Ms. Espinoza  has a past surgical history that includes Abdominal hysterectomy; Cholecystectomy; and Hand surgery. Family: family history includes Cancer in her other; Diabetes in her other; Seizures in her other; Stroke in her other.  Constitutional Exam  General appearance: Well nourished, well developed, and well hydrated. In no apparent acute distress Vitals:   11/21/17 1102  BP: 131/61  Pulse: 66  Resp: 16  Temp:  98.1 F (36.7 C)  TempSrc: Oral  SpO2: 100%  Weight: 115 lb (52.2 kg)  Height: '5\' 1"'  (1.549 m)   BMI Assessment: Estimated body mass index is 21.73 kg/m as calculated from the following:   Height as of this encounter: '5\' 1"'  (1.549 m).   Weight as of this encounter: 115 lb (52.2  kg).  BMI interpretation table: BMI level Category Range association with higher incidence of chronic pain  <18 kg/m2 Underweight   18.5-24.9 kg/m2 Ideal body weight   25-29.9 kg/m2 Overweight Increased incidence by 20%  30-34.9 kg/m2 Obese (Class I) Increased incidence by 68%  35-39.9 kg/m2 Severe obesity (Class II) Increased incidence by 136%  >40 kg/m2 Extreme obesity (Class III) Increased incidence by 254%   Patient's current BMI Ideal Body weight  Body mass index is 21.73 kg/m. Ideal body weight: 47.8 kg (105 lb 6.1 oz) Adjusted ideal body weight: 49.5 kg (109 lb 3.7 oz)   BMI Readings from Last 4 Encounters:  11/21/17 21.73 kg/m  09/23/17 21.73 kg/m  09/10/17 21.73 kg/m  08/05/17 22.67 kg/m   Wt Readings from Last 4 Encounters:  11/21/17 115 lb (52.2 kg)  09/23/17 115 lb (52.2 kg)  09/10/17 115 lb (52.2 kg)  08/05/17 120 lb (54.4 kg)  Psych/Mental status: Alert, oriented x 3 (person, place, & time)       Eyes: PERLA Respiratory: No evidence of acute respiratory distress  Cervical Spine Area Exam  Skin & Axial Inspection: No masses, redness, edema, swelling, or associated skin lesions Alignment: Symmetrical Functional ROM: Unrestricted ROM      Stability: No instability detected Muscle Tone/Strength: Functionally intact. No obvious neuro-muscular anomalies detected. Sensory (Neurological): Unimpaired Palpation: No palpable anomalies              Upper Extremity (UE) Exam    Side: Right upper extremity  Side: Left upper extremity  Skin & Extremity Inspection: Skin color, temperature, and hair growth are WNL. No peripheral edema or cyanosis. No masses, redness, swelling, asymmetry, or associated skin lesions. No contractures.  Skin & Extremity Inspection: Skin color, temperature, and hair growth are WNL. No peripheral edema or cyanosis. No masses, redness, swelling, asymmetry, or associated skin lesions. No contractures.  Functional ROM: Unrestricted ROM           Functional ROM: Unrestricted ROM          Muscle Tone/Strength: Functionally intact. No obvious neuro-muscular anomalies detected.  Muscle Tone/Strength: Functionally intact. No obvious neuro-muscular anomalies detected.  Sensory (Neurological): Unimpaired          Sensory (Neurological): Unimpaired          Palpation: No palpable anomalies              Palpation: No palpable anomalies              Provocative Test(s):  Phalen's test: deferred Tinel's test: deferred Apley's scratch test (touch opposite shoulder):  Action 1 (Across chest): deferred Action 2 (Overhead): deferred Action 3 (LB reach): deferred   Provocative Test(s):  Phalen's test: deferred Tinel's test: deferred Apley's scratch test (touch opposite shoulder):  Action 1 (Across chest): deferred Action 2 (Overhead): deferred Action 3 (LB reach): deferred    Thoracic Spine Area Exam  Skin & Axial Inspection: No masses, redness, or swelling Alignment: Symmetrical Functional ROM: Unrestricted ROM Stability: No instability detected Muscle Tone/Strength: Functionally intact. No obvious neuro-muscular anomalies detected. Sensory (Neurological): Unimpaired Muscle strength & Tone: No palpable anomalies  Lumbar Spine Area Exam  Skin & Axial Inspection: No masses, redness, or swelling Alignment: Symmetrical Functional ROM: Unrestricted ROM       Stability: No instability detected Muscle Tone/Strength: Functionally intact. No obvious neuro-muscular anomalies detected. Sensory (Neurological): Unimpaired Palpation: No palpable anomalies       Provocative Tests: Lumbar Hyperextension/rotation test: deferred today       Lumbar quadrant test (Kemp's test): deferred today       Lumbar Lateral bending test: deferred today       Patrick's Maneuver: deferred today                   FABER test: deferred today                   Thigh-thrust test: deferred today       S-I compression test: deferred today       S-I distraction test:  deferred today        Gait & Posture Assessment  Ambulation: Unassisted Gait: Relatively normal for age and body habitus Posture: WNL   Lower Extremity Exam    Side: Right lower extremity  Side: Left lower extremity  Stability: No instability observed          Stability: No instability observed          Skin & Extremity Inspection: Skin color, temperature, and hair growth are WNL. No peripheral edema or cyanosis. No masses, redness, swelling, asymmetry, or associated skin lesions. No contractures.  Skin & Extremity Inspection: Skin color, temperature, and hair growth are WNL. No peripheral edema or cyanosis. No masses, redness, swelling, asymmetry, or associated skin lesions. No contractures.  Functional ROM: Unrestricted ROM                  Functional ROM: Unrestricted ROM                  Muscle Tone/Strength: Functionally intact. No obvious neuro-muscular anomalies detected.  Muscle Tone/Strength: Functionally intact. No obvious neuro-muscular anomalies detected.  Sensory (Neurological): Unimpaired  Sensory (Neurological): Unimpaired  Palpation: No palpable anomalies  Palpation: No palpable anomalies   Assessment  Primary Diagnosis & Pertinent Problem List: There were no encounter diagnoses.  Status Diagnosis  Controlled Controlled Controlled No diagnosis found.  Problems updated and reviewed during this visit: No problems updated. Plan of Care  Pharmacotherapy (Medications Ordered): No orders of the defined types were placed in this encounter.  Lab-work, procedure(s), and/or referral(s): No orders of the defined types were placed in this encounter.   Pharmacological management options:  Opioid Analgesics: We'll take over management today. See above orders Membrane stabilizer: We have discussed the possibility of optimizing this mode of therapy, if tolerated Muscle relaxant: We have discussed the possibility of a trial NSAID: We have discussed the possibility of a  trial Other analgesic(s): To be determined at a later time   Interventional management options: Planned, scheduled, and/or pending:    ***   Considering:   ***   PRN Procedures:   To be determined at a later time   Provider-requested follow-up: No follow-ups on file.  Future Appointments  Date Time Provider [place]  11/21/2017 11:45 AM Gillis Santa, MD [facility] None    Primary Care Physician: [facility] Location: [facility] Outpatient [facility] Management Facility Note by: Gillis Santa, M.D Date: 11/21/2017; Time: 11:21 AM  There are no Patient Instructions on file for this visit.

## 2017-11-21 NOTE — Progress Notes (Signed)
Nursing Pain Medication Assessment:  Safety precautions to be maintained throughout the outpatient stay will include: orient to surroundings, keep bed in low position, maintain call bell within reach at all times, provide assistance with transfer out of bed and ambulation.  Medication Inspection Compliance: Pill count conducted under aseptic conditions, in front of the patient. Neither the pills nor the bottle was removed from the patient's sight at any time. Once count was completed pills were immediately returned to the patient in their original bottle.  Medication: Oxycodone/APAP Pill/Patch Count: 0 of 60 pills remain Pill/Patch Appearance: Markings consistent with prescribed medication Bottle Appearance: Standard pharmacy container. Clearly labeled. Filled Date: 5 / 29 / 2019 Last Medication intake:  Today

## 2017-11-21 NOTE — Progress Notes (Signed)
Patient's Name: [name]  MRN: [id number]  Referring Provider: [facility]  DOB: 1971-02-18  PCP: [facility]  DOS: 11/21/2017  Note by: Gillis Santa, MD  Service setting: Ambulatory outpatient  Specialty: Interventional Pain Management  Location: [facility] (AMB) Pain Management Facility    Patient type: Established   Primary Reason(s) for Visit: Encounter for prescription drug management & post-procedure evaluation of chronic illness with mild to moderate exacerbation(Level of risk: moderate) CC: Back Pain (lower) and Leg Pain (bilaterally, left is worse)  HPI  [name] is a 47 y.o. year old, female patient, who comes today for a post-procedure evaluation and medication management. She has Lumbar radiculopathy; Lumbar degenerative disc disease; Lumbar spondylosis; Chronic low back pain with bilateral sciatica; Chronic pain syndrome; Lumbar sprain; and Chronic migraine without aura without status migrainosus, not intractable on their problem list. Her primarily concern today is the Back Pain (lower) and Leg Pain (bilaterally, left is worse)  Pain Assessment: Location: Lower Back Radiating: down back of left leg to big toe, "like an electrical current". Onset: More than a month ago Duration: Chronic pain Quality: Constant, Pounding, Stabbing Severity: 7 /10 (subjective, self-reported pain score)  Note: Reported level is inconsistent with clinical observations. Clinically the patient looks like a 3/10 A 3/10 is viewed as "Moderate" and described as significantly interfering with activities of daily living (ADL). It becomes difficult to feed, bathe, get dressed, get on and off the toilet or to perform personal hygiene functions. Difficult to get in and out of bed or a chair without assistance. Very distracting. With effort, it can be ignored when deeply involved in activities.       When using our objective Pain Scale, levels between 6 and 10/10 are said to  belong in an emergency room, as it progressively worsens from a 6/10, described as severely limiting, requiring emergency care not usually available at an outpatient pain management facility. At a 6/10 level, communication becomes difficult and requires great effort. Assistance to reach the emergency department may be required. Facial flushing and profuse sweating along with potentially dangerous increases in heart rate and blood pressure will be evident. Effect on ADL: takes longer to perform daily activities Timing: Constant Modifying factors: medications BP: 131/61  HR: 66  [name] was last seen on 09/24/2017 for a procedure. During today's appointment we reviewed [name]'s post-procedure results, as well as her outpatient medication regimen.  Further details on both, my assessment(s), as well as the proposed treatment plan, please see below.  Controlled Substance Pharmacotherapy Assessment REMS (Risk Evaluation and Mitigation Strategy)  Analgesic: Percocet 5 mg twice daily as needed, quantity 60/month MME/day: 15 mg/day.  [name], RN  11/21/2017 11:04 AM  Sign at close encounter Nursing Pain Medication Assessment:  Safety precautions to be maintained throughout the outpatient stay will include: orient to surroundings, keep bed in low position, maintain call bell within reach at all times, provide assistance with transfer out of bed and ambulation.  Medication Inspection Compliance: Pill count conducted under aseptic conditions, in front of the patient. Neither the pills nor the bottle was removed from the patient's sight at any time. Once count was completed pills were immediately returned to the patient in their original bottle.  Medication: Oxycodone/APAP Pill/Patch Count: 0 of 60 pills remain Pill/Patch Appearance: Markings consistent with prescribed medication Bottle Appearance: Standard pharmacy container. Clearly labeled. Filled Date: 5 / 29 / 2019 Last Medication intake:   Today  Pharmacokinetics: Liberation and absorption (onset of action): WNL Distribution (time to peak effect): WNL Metabolism and excretion (duration of action): WNL         Pharmacodynamics: Desired effects: Analgesia: [name] reports >50% benefit. Functional ability: Patient reports that medication does help, but not nearly as much as she would like Clinically meaningful improvement in function (CMIF): Sustained CMIF goals met Perceived effectiveness: Described as relatively effective but with some room for improvement Undesirable effects: Side-effects or Adverse reactions: None reported Monitoring: [place] PMP: Online review of the past 108-monthperiod conducted. Compliant with practice rules and regulations Last UDS on record: Summary  Date Value Ref Range Status  06/27/2017 FINAL  Final    Comment:    ==================================================================== TOXASSURE SELECT 13 (MW) ==================================================================== Test                             Result       Flag       Units Drug Present and Declared for Prescription Verification   Oxycodone                      >7299        EXPECTED   ng/mg creat   Oxymorphone                    4171         EXPECTED   ng/mg creat   Noroxycodone                   >7299        EXPECTED   ng/mg creat   Noroxymorphone                 1593         EXPECTED   ng/mg creat    Sources of oxycodone are scheduled prescription medications.    Oxymorphone, noroxycodone, and noroxymorphone are expected    metabolites of oxycodone. Oxymorphone is also available as a    scheduled prescription medication. Drug Absent but Declared for Prescription Verification   Temazepam                      Not Detected UNEXPECTED ng/mg creat ==================================================================== Test                      Result    Flag   Units      Ref Range   Creatinine              137              mg/dL       >=20 ==================================================================== Declared Medications:  The flagging and interpretation on this report are based on the  following declared medications.  Unexpected results may arise from  inaccuracies in the declared medications.  **Note: The testing scope of this panel includes these medications:  Oxycodone (Oxycodone Acetaminophen)  Temazepam  **Note: The testing scope of this panel does not include following  reported medications:  Acetaminophen  Acetaminophen (Oxycodone Acetaminophen)  Metoprolol  Ranitidine ==================================================================== For clinical consultation, please call ([phone] ====================================================================    UDS interpretation: Compliant          Medication Assessment Form: Reviewed. Patient indicates being compliant with therapy Treatment compliance: Compliant Risk Assessment Profile: Aberrant behavior: See prior evaluations. None observed or detected today Comorbid factors increasing risk of overdose:  See prior notes. No additional risks detected today Risk of substance use disorder (SUD): Low Opioid Risk Tool - 11/21/17 1111      Family History of Substance Abuse   Alcohol  Negative    Illegal Drugs  Negative    Rx Drugs  Negative      Personal History of Substance Abuse   Alcohol  Negative    Illegal Drugs  Negative    Rx Drugs  Negative      Age   Age between 28-45 years   No      History of Preadolescent Sexual Abuse   History of Preadolescent Sexual Abuse  Negative or Female      Psychological Disease   Psychological Disease  Negative    Depression  Negative      Total Score   Opioid Risk Tool Scoring  0    Opioid Risk Interpretation  Low Risk      ORT Scoring interpretation table:  Score <3 = Low Risk for SUD  Score between 4-7 = Moderate Risk for SUD  Score >8 = High Risk for Opioid Abuse   Risk Mitigation  Strategies:  Patient Counseling: Covered Patient-Prescriber Agreement (PPA): Present and active  Notification to other healthcare providers: Done  Pharmacologic Plan: Increase Percocet from 5 mg twice daily to 3 times daily as needed, quantity 75/month.  No further dose escalation beyond this.             Post-Procedure Assessment  09/23/2017 Procedure: Bilateral SI joint injection  Influential Factors: BMI: 21.73 kg/m Intra-procedural challenges: None observed.         Assessment challenges: None detected.              Reported side-effects: None.        Post-procedural adverse reactions or complications: None reported         Sedation: Please see nurses note. When no sedatives are used, the analgesic levels obtained are directly associated to the effectiveness of the local anesthetics. However, when sedation is provided, the level of analgesia obtained during the initial 1 hour following the intervention, is believed to be the result of a combination of factors. These factors may include, but are not limited to: 1. The effectiveness of the local anesthetics used. 2. The effects of the analgesic(s) and/or anxiolytic(s) used. 3. The degree of discomfort experienced by the patient at the time of the procedure. 4. The patients ability and reliability in recalling and recording the events. 5. The presence and influence of possible secondary gains and/or psychosocial factors. Reported result: Relief experienced during the 1st hour after the procedure: 100 % (Ultra-Short Term Relief)            Interpretative annotation: Clinically appropriate result. Analgesia during this period is likely to be Local Anesthetic and/or IV Sedative (Analgesic/Anxiolytic) related.          Effects of local anesthetic: The analgesic effects attained during this period are directly associated to the localized infiltration of local anesthetics and therefore cary significant diagnostic value as to the etiological  location, or anatomical origin, of the pain. Expected duration of relief is directly dependent on the pharmacodynamics of the local anesthetic used. Long-acting (4-6 hours) anesthetics used.  Reported result: Relief during the next 4 to 6 hour after the procedure: 100 % (Short-Term Relief)            Interpretative annotation: Clinically appropriate result. Analgesia during this period is likely to be Local Anesthetic-related.  Long-term benefit: Defined as the period of time past the expected duration of local anesthetics (1 hour for short-acting and 4-6 hours for long-acting). With the possible exception of prolonged sympathetic blockade from the local anesthetics, benefits during this period are typically attributed to, or associated with, other factors such as analgesic sensory neuropraxia, antiinflammatory effects, or beneficial biochemical changes provided by agents other than the local anesthetics.  Reported result: Extended relief following procedure: 0 % (Long-Term Relief)            Interpretative annotation: Unexpected result. No long-term benefit. No long-term benefit expected. Limited inflammation. Possible mechanical aggravating factors.          Current benefits: Defined as reported results that persistent at this point in time.   Analgesia: 0 %            Function: No benefit ROM: No benefit Interpretative annotation: Recurrence of symptoms. Therapeutic failure. Results would argue against repeating therapy.          Interpretation: Results would suggest therapy to have a limited impact on the patient's condition.                  Laboratory Chemistry  Inflammation Markers (CRP: Acute Phase) (ESR: Chronic Phase) No results found for: CRP, ESRSEDRATE, LATICACIDVEN                       Rheumatology Markers No results found for: RF, ANA, LABURIC, URICUR, LYMEIGGIGMAB, LYMEABIGMQN, HLAB27                      Renal Function Markers Lab Results  Component Value Date    BUN 11 07/19/2017   CREATININE 0.83 07/19/2017   GFRAA >60 07/19/2017   GFRNONAA >60 07/19/2017                             Hepatic Function Markers Lab Results  Component Value Date   AST 17 03/24/2013   ALT 8 03/24/2013   ALBUMIN 3.6 03/24/2013   ALKPHOS 90 03/24/2013   LIPASE 25 03/24/2013                        Electrolytes Lab Results  Component Value Date   NA 138 07/19/2017   K 3.1 (L) 07/19/2017   CL 105 07/19/2017   CALCIUM 8.0 (L) 07/19/2017                        Neuropathy Markers No results found for: VITAMINB12, FOLATE, HGBA1C, HIV                      Bone Pathology Markers No results found for: VD25OH, VD125OH2TOT, [id number], [id number], 25OHVITD1, 25OHVITD2, 25OHVITD3, TESTOFREE, TESTOSTERONE                       Coagulation Parameters Lab Results  Component Value Date   PLT 288 07/19/2017                        Cardiovascular Markers Lab Results  Component Value Date   HGB 12.1 07/19/2017   HCT 37.3 07/19/2017                         CA Markers No results found for: CEA, CA125,  LABCA2                      Note: Lab results reviewed.  Recent Diagnostic Imaging Results  DG C-Arm 1-60 Min-No Report Fluoroscopy was utilized by the requesting physician.  No radiographic  interpretation.   Complexity Note: Imaging results reviewed. Results shared with Ms. Sharen Counter, using Layman's terms.                         Meds   Current Outpatient Medications:  .  acetaminophen (TYLENOL) 500 MG tablet, Take 1,000 mg by mouth every 8 (eight) hours as needed for mild pain, moderate pain, fever or headache. , Disp: , Rfl:  .  metoprolol succinate (TOPROL-XL) 100 MG 24 hr tablet, Take 100 mg by mouth daily. Take with or immediately following a meal., Disp: , Rfl:  .  oxyCODONE-acetaminophen (PERCOCET/ROXICET) 5-325 MG tablet, Take 1-2 tablets by mouth 2 (two) times daily as needed for severe pain. For chronic pain.  To last for 30 days from fill date. To fill on  or after: 11/21/17, 12/21/17, 01/20/18  Max 75/month, Disp: 75 tablet, Rfl: 0 .  RaNITidine HCl (ZANTAC PO), Take 1 tablet by mouth 3 (three) times daily. , Disp: , Rfl:  .  temazepam (RESTORIL) 30 MG capsule, Take 30 mg by mouth at bedtime as needed for sleep. , Disp: , Rfl:   ROS  Constitutional: Denies any fever or chills Gastrointestinal: No reported hemesis, hematochezia, vomiting, or acute GI distress Musculoskeletal: Denies any acute onset joint swelling, redness, loss of ROM, or weakness Neurological: No reported episodes of acute onset apraxia, aphasia, dysarthria, agnosia, amnesia, paralysis, loss of coordination, or loss of consciousness  Allergies  Ms. Debruyn is allergic to buprenorphine; cefaclor; gabapentin; ibuprofen; levofloxacin; macrobid [nitrofurantoin monohyd macro]; naproxen; and penicillins.  [place]  Drug: Ms. Prout  reports that she does not use drugs. Alcohol:  reports that she does not drink alcohol. Tobacco:  reports that she has been smoking cigarettes.  She has a 20.00 pack-year smoking history. She has never used smokeless tobacco. Medical:  has a past medical history of Chronic back pain, Hypertension, and Migraine. Surgical: Ms. Hanselman  has a past surgical history that includes Abdominal hysterectomy; Cholecystectomy; and Hand surgery. Family: family history includes Cancer in her other; Diabetes in her other; Seizures in her other; Stroke in her other.  Constitutional Exam  General appearance: Well nourished, well developed, and well hydrated. In no apparent acute distress Vitals:   11/21/17 1102  BP: 131/61  Pulse: 66  Resp: 16  Temp: 98.1 F (36.7 C)  TempSrc: Oral  SpO2: 100%  Weight: 115 lb (52.2 kg)  Height: '5\' 1"'  (1.549 m)   BMI Assessment: Estimated body mass index is 21.73 kg/m as calculated from the following:   Height as of this encounter: '5\' 1"'  (1.549 m).   Weight as of this encounter: 115 lb (52.2 kg).  BMI interpretation table: BMI level  Category Range association with higher incidence of chronic pain  <18 kg/m2 Underweight   18.5-24.9 kg/m2 Ideal body weight   25-29.9 kg/m2 Overweight Increased incidence by 20%  30-34.9 kg/m2 Obese (Class I) Increased incidence by 68%  35-39.9 kg/m2 Severe obesity (Class II) Increased incidence by 136%  >40 kg/m2 Extreme obesity (Class III) Increased incidence by 254%   Patient's current BMI Ideal Body weight  Body mass index is 21.73 kg/m. Ideal body weight: 47.8 kg (105 lb 6.1  oz) Adjusted ideal body weight: 49.5 kg (109 lb 3.7 oz)   BMI Readings from Last 4 Encounters:  11/21/17 21.73 kg/m  09/23/17 21.73 kg/m  09/10/17 21.73 kg/m  08/05/17 22.67 kg/m   Wt Readings from Last 4 Encounters:  11/21/17 115 lb (52.2 kg)  09/23/17 115 lb (52.2 kg)  09/10/17 115 lb (52.2 kg)  08/05/17 120 lb (54.4 kg)  Psych/Mental status: Alert, oriented x 3 (person, place, & time)       Eyes: PERLA Respiratory: No evidence of acute respiratory distress  Cervical Spine Area Exam  Skin & Axial Inspection: No masses, redness, edema, swelling, or associated skin lesions Alignment: Symmetrical Functional ROM: Unrestricted ROM      Stability: No instability detected Muscle Tone/Strength: Functionally intact. No obvious neuro-muscular anomalies detected. Sensory (Neurological): Unimpaired Palpation: No palpable anomalies              Upper Extremity (UE) Exam    Side: Right upper extremity  Side: Left upper extremity  Skin & Extremity Inspection: Skin color, temperature, and hair growth are WNL. No peripheral edema or cyanosis. No masses, redness, swelling, asymmetry, or associated skin lesions. No contractures.  Skin & Extremity Inspection: Skin color, temperature, and hair growth are WNL. No peripheral edema or cyanosis. No masses, redness, swelling, asymmetry, or associated skin lesions. No contractures.  Functional ROM: Unrestricted ROM          Functional ROM: Unrestricted ROM           Muscle Tone/Strength: Functionally intact. No obvious neuro-muscular anomalies detected.  Muscle Tone/Strength: Functionally intact. No obvious neuro-muscular anomalies detected.  Sensory (Neurological): Unimpaired          Sensory (Neurological): Unimpaired          Palpation: No palpable anomalies              Palpation: No palpable anomalies              Provocative Test(s):  Phalen's test: deferred Tinel's test: deferred Apley's scratch test (touch opposite shoulder):  Action 1 (Across chest): deferred Action 2 (Overhead): deferred Action 3 (LB reach): deferred   Provocative Test(s):  Phalen's test: deferred Tinel's test: deferred Apley's scratch test (touch opposite shoulder):  Action 1 (Across chest): deferred Action 2 (Overhead): deferred Action 3 (LB reach): deferred    Thoracic Spine Area Exam  Skin & Axial Inspection: No masses, redness, or swelling Alignment: Symmetrical Functional ROM: Unrestricted ROM Stability: No instability detected Muscle Tone/Strength: Functionally intact. No obvious neuro-muscular anomalies detected. Sensory (Neurological): Unimpaired Muscle strength & Tone: No palpable anomalies  Lumbar Spine Area Exam  Skin & Axial Inspection: No masses, redness, or swelling Alignment: Symmetrical Functional ROM: Decreased ROM       Stability: No instability detected Muscle Tone/Strength: Functionally intact. No obvious neuro-muscular anomalies detected. Sensory (Neurological): Musculoskeletal pain pattern Palpation: No palpable anomalies       Provocative Tests: Lumbar Hyperextension/rotation test: (+) bilaterally for facet joint pain. Lumbar quadrant test (Kemp's test): deferred today       Lumbar Lateral bending test: (+) due to pain. Patrick's Maneuver: deferred today                   FABER test: (+) for bilateral S-I arthralgia             Thigh-thrust test: deferred today       S-I compression test: deferred today       S-I distraction test:  deferred today  Gait & Posture Assessment  Ambulation: Unassisted Gait: Relatively normal for age and body habitus Posture: WNL   Lower Extremity Exam    Side: Right lower extremity  Side: Left lower extremity  Stability: No instability observed          Stability: No instability observed          Skin & Extremity Inspection: Skin color, temperature, and hair growth are WNL. No peripheral edema or cyanosis. No masses, redness, swelling, asymmetry, or associated skin lesions. No contractures.  Skin & Extremity Inspection: Skin color, temperature, and hair growth are WNL. No peripheral edema or cyanosis. No masses, redness, swelling, asymmetry, or associated skin lesions. No contractures.  Functional ROM: Unrestricted ROM                  Functional ROM: Unrestricted ROM                  Muscle Tone/Strength: Functionally intact. No obvious neuro-muscular anomalies detected.  Muscle Tone/Strength: Functionally intact. No obvious neuro-muscular anomalies detected.  Sensory (Neurological): Unimpaired  Sensory (Neurological): Unimpaired  Palpation: No palpable anomalies  Palpation: No palpable anomalies   Assessment  Primary Diagnosis & Pertinent Problem List: The primary encounter diagnosis was Lumbar spondylosis. Diagnoses of Lumbar degenerative disc disease, Lumbar radiculopathy, Chronic pain syndrome, and SI joint arthritis were also pertinent to this visit.  Status Diagnosis  Persistent Persistent Persistent 1. Lumbar spondylosis   2. Lumbar degenerative disc disease   3. Lumbar radiculopathy   4. Chronic pain syndrome   5. SI joint arthritis     General Recommendations: The pain condition that the patient suffers from is best treated with a multidisciplinary approach that involves an increase in physical activity to prevent de-conditioning and worsening of the pain cycle, as well as psychological counseling (formal and/or informal) to address the co-morbid psychological affects  of pain. Treatment will often involve judicious use of pain medications and interventional procedures to decrease the pain, allowing the patient to participate in the physical activity that will ultimately produce long-lasting pain reductions. The goal of the multidisciplinary approach is to return the patient to a higher level of overall function and to restore their ability to perform activities of daily living.  47 year old female with a history of axial low back pain that radiates down her left leg secondary to lumbar radiculopathy, lumbar spondylosis, lumbar degenerative disc disease along with bilateral hip and groin pain secondary to SI joint arthritis who follows up status post bilateral SI joint injections which were not helpful for her buttock and groin pain.  Given that the patient is having worsening intermittent pain and sometimes has to take an additional Percocet in the evening, we will increase her Percocet from 5 mg twice daily to 5 mill grams 3 times daily as needed maximum 75/month.  Of note patient has tried various non-opioid analgesics including Lyrica, Cymbalta, gabapentin none of which were effective and resulted in side effects of sedation and nausea.  She has also tried tramadol and Butrans without any significant benefit.  Plan of Care  Pharmacotherapy (Medications Ordered): Meds ordered this encounter  Medications  . DISCONTD: oxyCODONE-acetaminophen (PERCOCET/ROXICET) 5-325 MG tablet    Sig: Take 1-2 tablets by mouth 2 (two) times daily as needed for severe pain. For chronic pain.  To last for 30 days from fill date. To fill on or after: 11/21/17, 12/21/17, 01/20/18  Max 75/month    Dispense:  75 tablet    Refill:  0  .  DISCONTD: oxyCODONE-acetaminophen (PERCOCET/ROXICET) 5-325 MG tablet    Sig: Take 1-2 tablets by mouth 2 (two) times daily as needed for severe pain. For chronic pain.  To last for 30 days from fill date. To fill on or after: 11/21/17, 12/21/17, 01/20/18  Max  75/month    Dispense:  75 tablet    Refill:  0  . oxyCODONE-acetaminophen (PERCOCET/ROXICET) 5-325 MG tablet    Sig: Take 1-2 tablets by mouth 2 (two) times daily as needed for severe pain. For chronic pain.  To last for 30 days from fill date. To fill on or after: 11/21/17, 12/21/17, 01/20/18  Max 75/month    Dispense:  75 tablet    Refill:  0   Time Note: Greater than 50% of the 25 minute(s) of face-to-face time spent with Ms. Fatica, was spent in counseling/coordination of care regarding: Ms. Narula primary cause of pain, the treatment plan, treatment alternatives, medication side effects, the opioid analgesic risks and possible complications, the appropriate use of her medications, realistic expectations and the goals of pain management (increased in functionality).   Future Appointments  Date Time Provider [place]  02/18/2018 12:00 PM Gillis Santa, MD [facility] None    Primary Care Physician: [facility] Location: [facility] Outpatient [facility] Facility Note by: Gillis Santa, M.D Date: 11/21/2017; Time: 3:22 PM  Patient Instructions  You have been given 3 Rx for Oxycodone/APAP  to last until 02/19/2018.

## 2017-11-21 NOTE — Patient Instructions (Signed)
You have been given 3 Rx for Oxycodone/APAP  to last until 02/19/2018.

## 2018-01-27 ENCOUNTER — Telehealth: Payer: Self-pay | Admitting: *Deleted

## 2018-02-18 ENCOUNTER — Ambulatory Visit
Payer: BLUE CROSS/BLUE SHIELD | Attending: Student in an Organized Health Care Education/Training Program | Admitting: Nurse Practitioner

## 2018-02-18 ENCOUNTER — Encounter: Payer: Self-pay | Admitting: Nurse Practitioner

## 2018-02-18 ENCOUNTER — Other Ambulatory Visit: Payer: Self-pay

## 2018-02-18 VITALS — BP 130/112 | HR 79 | Temp 98.2°F | Resp 16 | Ht 61.0 in | Wt 115.0 lb

## 2018-02-18 DIAGNOSIS — Z79899 Other long term (current) drug therapy: Secondary | ICD-10-CM | POA: Insufficient documentation

## 2018-02-18 DIAGNOSIS — G894 Chronic pain syndrome: Secondary | ICD-10-CM | POA: Diagnosis not present

## 2018-02-18 DIAGNOSIS — M4726 Other spondylosis with radiculopathy, lumbar region: Secondary | ICD-10-CM | POA: Diagnosis not present

## 2018-02-18 DIAGNOSIS — G43709 Chronic migraine without aura, not intractable, without status migrainosus: Secondary | ICD-10-CM | POA: Diagnosis not present

## 2018-02-18 DIAGNOSIS — M5416 Radiculopathy, lumbar region: Secondary | ICD-10-CM | POA: Diagnosis not present

## 2018-02-18 DIAGNOSIS — Z79891 Long term (current) use of opiate analgesic: Secondary | ICD-10-CM

## 2018-02-18 DIAGNOSIS — M545 Low back pain: Secondary | ICD-10-CM | POA: Diagnosis present

## 2018-02-18 DIAGNOSIS — M47816 Spondylosis without myelopathy or radiculopathy, lumbar region: Secondary | ICD-10-CM

## 2018-02-18 DIAGNOSIS — M5441 Lumbago with sciatica, right side: Secondary | ICD-10-CM

## 2018-02-18 DIAGNOSIS — Z7901 Long term (current) use of anticoagulants: Secondary | ICD-10-CM | POA: Insufficient documentation

## 2018-02-18 DIAGNOSIS — G8929 Other chronic pain: Secondary | ICD-10-CM

## 2018-02-18 DIAGNOSIS — F17210 Nicotine dependence, cigarettes, uncomplicated: Secondary | ICD-10-CM | POA: Insufficient documentation

## 2018-02-18 DIAGNOSIS — M5442 Lumbago with sciatica, left side: Secondary | ICD-10-CM

## 2018-02-18 DIAGNOSIS — I10 Essential (primary) hypertension: Secondary | ICD-10-CM | POA: Insufficient documentation

## 2018-02-18 MED ORDER — OXYCODONE-ACETAMINOPHEN 5-325 MG PO TABS: 1.0000 | ORAL_TABLET | Freq: Two times a day (BID) | ORAL | 0 refills | Status: DC

## 2018-02-18 MED ORDER — OXYCODONE-ACETAMINOPHEN 5-325 MG PO TABS: 1.0000 | ORAL_TABLET | Freq: Two times a day (BID) | ORAL | 0 refills | Status: DC | PRN

## 2018-02-18 NOTE — Progress Notes (Signed)
Nursing Pain Medication Assessment:  Safety precautions to be maintained throughout the outpatient stay will include: orient to surroundings, keep bed in low position, maintain call bell within reach at all times, provide assistance with transfer out of bed and ambulation.  Medication Inspection Compliance: Pill count conducted under aseptic conditions, in front of the patient. Neither the pills nor the bottle was removed from the patient's sight at any time. Once count was completed pills were immediately returned to the patient in their original bottle.  Medication: Oxycodone/APAP Pill/Patch Count: 0 of 75 pills remain Pill/Patch Appearance: Markings consistent with prescribed medication Bottle Appearance: Standard pharmacy container. Clearly labeled. Filled Date: 59 / 9 / 2019 Last Medication intake:  Today

## 2018-02-18 NOTE — Patient Instructions (Signed)
____________________________________________________________________________________________  Medication Rules  Applies to: All patients receiving prescriptions (written or electronic).  Pharmacy of record: Pharmacy where electronic prescriptions will be sent. If written prescriptions are taken to a different pharmacy, please inform the nursing staff. The pharmacy listed in the electronic medical record should be the one where you would like electronic prescriptions to be sent.  Prescription refills: Only during scheduled appointments. Applies to both, written and electronic prescriptions.  NOTE: The following applies primarily to controlled substances (Opioid* Pain Medications).   Patient's responsibilities: 1. Pain Pills: Bring all pain pills to every appointment (except for procedure appointments). 2. Pill Bottles: Bring pills in original pharmacy bottle. Always bring newest bottle. Bring bottle, even if empty. 3. Medication refills: You are responsible for knowing and keeping track of what medications you need refilled. The day before your appointment, write a list of all prescriptions that need to be refilled. Bring that list to your appointment and give it to the admitting nurse. Prescriptions will be written only during appointments. If you forget a medication, it will not be "Called in", "Faxed", or "electronically sent". You will need to get another appointment to get these prescribed. 4. Prescription Accuracy: You are responsible for carefully inspecting your prescriptions before leaving our office. Have the discharge nurse carefully go over each prescription with you, before taking them home. Make sure that your name is accurately spelled, that your address is correct. Check the name and dose of your medication to make sure it is accurate. Check the number of pills, and the written instructions to make sure they are clear and accurate. Make sure that you are given enough medication to last  until your next medication refill appointment. 5. Taking Medication: Take medication as prescribed. Never take more pills than instructed. Never take medication more frequently than prescribed. Taking less pills or less frequently is permitted and encouraged, when it comes to controlled substances (written prescriptions).  6. Inform other Doctors: Always inform, all of your healthcare providers, of all the medications you take. 7. Pain Medication from other Providers: You are not allowed to accept any additional pain medication from any other Doctor or Healthcare provider. There are two exceptions to this rule. (see below) In the event that you require additional pain medication, you are responsible for notifying us, as stated below. 8. Medication Agreement: You are responsible for carefully reading and following our Medication Agreement. This must be signed before receiving any prescriptions from our practice. Safely store a copy of your signed Agreement. Violations to the Agreement will result in no further prescriptions. (Additional copies of our Medication Agreement are available upon request.) 9. Laws, Rules, & Regulations: All patients are expected to follow all Federal and State Laws, Statutes, Rules, & Regulations. Ignorance of the Laws does not constitute a valid excuse. The use of any illegal substances is prohibited. 10. Adopted CDC guidelines & recommendations: Target dosing levels will be at or below 60 MME/day. Use of benzodiazepines** is not recommended.  Exceptions: There are only two exceptions to the rule of not receiving pain medications from other Healthcare Providers. 1. Exception #1 (Emergencies): In the event of an emergency (i.e.: accident requiring emergency care), you are allowed to receive additional pain medication. However, you are responsible for: As soon as you are able, call our office [phone], at any time of the day or night, and leave a message stating your name, the  date and nature of the emergency, and the name and dose of the medication   prescribed. In the event that your call is answered by a member of our staff, make sure to document and save the date, time, and the name of the person that took your information.  2. Exception #2 (Planned Surgery): In the event that you are scheduled by another doctor or dentist to have any type of surgery or procedure, you are allowed (for a period no longer than 30 days), to receive additional pain medication, for the acute post-op pain. However, in this case, you are responsible for picking up a copy of our "Post-op Pain Management for Surgeons" handout, and giving it to your surgeon or dentist. This document is available at our office, and does not require an appointment to obtain it. Simply go to our office during business hours (Monday-Thursday from 8:00 AM to 4:00 PM) (Friday 8:00 AM to 12:00 Noon) or if you have a scheduled appointment with us, prior to your surgery, and ask for it by name. In addition, you will need to provide us with your name, name of your surgeon, type of surgery, and date of procedure or surgery.  *Opioid medications include: morphine, codeine, oxycodone, oxymorphone, hydrocodone, hydromorphone, meperidine, tramadol, tapentadol, buprenorphine, fentanyl, methadone. **Benzodiazepine medications include: diazepam (Valium), alprazolam (Xanax), clonazepam (Klonopine), lorazepam (Ativan), clorazepate (Tranxene), chlordiazepoxide (Librium), estazolam (Prosom), oxazepam (Serax), temazepam (Restoril), triazolam (Halcion) (Last updated: 07/11/2017) ____________________________________________________________________________________________   ____________________________________________________________________________________________  Pain Scale  Introduction: The pain score used by this practice is the Verbal Numerical Rating Scale (VNRS-11). This is an 11-point scale. It is for adults and children 10 years or  older. There are significant differences in how the pain score is reported, used, and applied. Forget everything you learned in the past and learn this scoring system.  General Information: The scale should reflect your current level of pain. Unless you are specifically asked for the level of your worst pain, or your average pain. If you are asked for one of these two, then it should be understood that it is over the past 24 hours.  Basic Activities of Daily Living (ADL): Personal hygiene, dressing, eating, transferring, and using restroom.  Instructions: Most patients tend to report their level of pain as a combination of two factors, their physical pain and their psychosocial pain. This last one is also known as "suffering" and it is reflection of how physical pain affects you socially and psychologically. From now on, report them separately. From this point on, when asked to report your pain level, report only your physical pain. Use the following table for reference.  Pain Clinic Pain Levels (0-5/10)  Pain Level Score  Description  No Pain 0   Mild pain 1 Nagging, annoying, but does not interfere with basic activities of daily living (ADL). Patients are able to eat, bathe, get dressed, toileting (being able to get on and off the toilet and perform personal hygiene functions), transfer (move in and out of bed or a chair without assistance), and maintain continence (able to control bladder and bowel functions). Blood pressure and heart rate are unaffected. A normal heart rate for a healthy adult ranges from 60 to 100 bpm (beats per minute).   Mild to moderate pain 2 Noticeable and distracting. Impossible to hide from other people. More frequent flare-ups. Still possible to adapt and function close to normal. It can be very annoying and may have occasional stronger flare-ups. With discipline, patients may get used to it and adapt.   Moderate pain 3 Interferes significantly with activities of daily  living (ADL).   It becomes difficult to feed, bathe, get dressed, get on and off the toilet or to perform personal hygiene functions. Difficult to get in and out of bed or a chair without assistance. Very distracting. With effort, it can be ignored when deeply involved in activities.   Moderately severe pain 4 Impossible to ignore for more than a few minutes. With effort, patients may still be able to manage work or participate in some social activities. Very difficult to concentrate. Signs of autonomic nervous system discharge are evident: dilated pupils (mydriasis); mild sweating (diaphoresis); sleep interference. Heart rate becomes elevated (>115 bpm). Diastolic blood pressure (lower number) rises above 100 mmHg. Patients find relief in laying down and not moving.   Severe pain 5 Intense and extremely unpleasant. Associated with frowning face and frequent crying. Pain overwhelms the senses.  Ability to do any activity or maintain social relationships becomes significantly limited. Conversation becomes difficult. Pacing back and forth is common, as getting into a comfortable position is nearly impossible. Pain wakes you up from deep sleep. Physical signs will be obvious: pupillary dilation; increased sweating; goosebumps; brisk reflexes; cold, clammy hands and feet; nausea, vomiting or dry heaves; loss of appetite; significant sleep disturbance with inability to fall asleep or to remain asleep. When persistent, significant weight loss is observed due to the complete loss of appetite and sleep deprivation.  Blood pressure and heart rate becomes significantly elevated. Caution: If elevated blood pressure triggers a pounding headache associated with blurred vision, then the patient should immediately seek attention at an urgent or emergency care unit, as these may be signs of an impending stroke.    Emergency Department Pain Levels (6-10/10)  Emergency Room Pain 6 Severely limiting. Requires emergency care  and should not be seen or managed at an outpatient pain management facility. Communication becomes difficult and requires great effort. Assistance to reach the emergency department may be required. Facial flushing and profuse sweating along with potentially dangerous increases in heart rate and blood pressure will be evident.   Distressing pain 7 Self-care is very difficult. Assistance is required to transport, or use restroom. Assistance to reach the emergency department will be required. Tasks requiring coordination, such as bathing and getting dressed become very difficult.   Disabling pain 8 Self-care is no longer possible. At this level, pain is disabling. The individual is unable to do even the most "basic" activities such as walking, eating, bathing, dressing, transferring to a bed, or toileting. Fine motor skills are lost. It is difficult to think clearly.   Incapacitating pain 9 Pain becomes incapacitating. Thought processing is no longer possible. Difficult to remember your own name. Control of movement and coordination are lost.   The worst pain imaginable 10 At this level, most patients pass out from pain. When this level is reached, collapse of the autonomic nervous system occurs, leading to a sudden drop in blood pressure and heart rate. This in turn results in a temporary and dramatic drop in blood flow to the brain, leading to a loss of consciousness. Fainting is one of the body's self defense mechanisms. Passing out puts the brain in a calmed state and causes it to shut down for a while, in order to begin the healing process.    Summary: 1. Refer to this scale when providing us with your pain level. 2. Be accurate and careful when reporting your pain level. This will help with your care. 3. Over-reporting your pain level will lead to loss of credibility. 4. Even   a level of 1/10 means that there is pain and will be treated at our facility. 5. High, inaccurate reporting will be  documented as "Symptom Exaggeration", leading to loss of credibility and suspicions of possible secondary gains such as obtaining more narcotics, or wanting to appear disabled, for fraudulent reasons. 6. Only pain levels of 5 or below will be seen at our facility. 7. Pain levels of 6 and above will be sent to the Emergency Department and the appointment cancelled. ____________________________________________________________________________________________    

## 2018-02-18 NOTE — Progress Notes (Signed)
Patient's Name: [name]  MRN: [id number]  Referring Provider: [facility]  DOB: [birth year]  PCP: [facility]  DOS: 02/18/2018  Note by: Vevelyn Francois NP  Service setting: Ambulatory outpatient  Specialty: Interventional Pain Management  Location: [facility] (AMB) Pain Management Facility    Patient type: Established    Primary Reason(s) for Visit: Encounter for prescription drug management. (Level of risk: moderate)  CC: Back Pain (lower)  HPI  [name] is a 47 y.o. year old, female patient, who comes today for a medication management evaluation. She has Lumbar radiculopathy; Lumbar degenerative disc disease; Lumbar spondylosis; Chronic low back pain with bilateral sciatica; Chronic pain syndrome; Lumbar sprain; and Chronic migraine without aura without status migrainosus, not intractable on their problem list. Her primarily concern today is the Back Pain (lower)  Pain Assessment: Location: Lower, Left, Right Back Radiating: buttocks/hip bilateral, down back of right leg to knee, and down back of leg to bottom of foot on left Onset: More than a month ago Duration: Chronic pain Quality: Aching, Burning, Stabbing, Pounding Severity: 6 /10 (subjective, self-reported pain score)  Note: Reported level is compatible with observation. Clinically the patient looks like a 2/10 A 2/10 is viewed as "Mild to Moderate" and described as noticeable and distracting. Impossible to hide from other people. More frequent flare-ups. Still possible to adapt and function close to normal. It can be very annoying and may have occasional stronger flare-ups. With discipline, patients may get used to it and adapt. Information on the proper use of the pain scale provided to the patient today. When using our objective Pain Scale, levels between 6 and 10/10 are said to belong in an emergency room, as it progressively worsens from a 6/10, described as severely limiting, requiring  emergency care not usually available at an outpatient pain management facility. At a 6/10 level, communication becomes difficult and requires great effort. Assistance to reach the emergency department may be required. Facial flushing and profuse sweating along with potentially dangerous increases in heart rate and blood pressure will be evident. Effect on ADL: walking, working,lifting, bending, squatting Timing: Constant Modifying factors: procedures, medication BP: (!) 130/112  HR: 79  [name] is in today for medication management. During today's appointment we reviewed [name]'s chronic pain status, as well as her outpatient medication regimen. She has burning pain "like an electrical current". She states that this is ongoing.  She admits that she was diagnosed and treated for pulmonary embolism in August.  She admits that she will follow-up with hematology on tomorrow because she is currently taking Eliquis..  She admits that she did well with her interventional therapy it seems to be helping to keep her pain under control along with the medications.  She was wondering when she can have this repeated.  The patient  reports that she does not use drugs. Her body mass index is 21.73 kg/m.  Further details on both, my assessment(s), as well as the proposed treatment plan, please see below.  Controlled Substance Pharmacotherapy Assessment REMS (Risk Evaluation and Mitigation Strategy)  Analgesic: Percocet 5 mg twice daily as needed, quantity 60/month MME/day: 15 mg/day.  Monica Specking, RN  02/18/2018  9:52 AM  Sign at close encounter Nursing Pain Medication Assessment:  Safety precautions to be maintained throughout the outpatient stay will include: orient to surroundings, keep bed in low position, maintain call bell within reach at all times, provide assistance with transfer out of bed  and ambulation.  Medication Inspection Compliance: Pill count conducted under aseptic conditions, in  front of the patient. Neither the pills nor the bottle was removed from the patient's sight at any time. Once count was completed pills were immediately returned to the patient in their original bottle.  Medication: Oxycodone/APAP Pill/Patch Count: 0 of 75 pills remain Pill/Patch Appearance: Markings consistent with prescribed medication Bottle Appearance: Standard pharmacy container. Clearly labeled. Filled Date: 20 / 9 / 2019 Last Medication intake:  Today   Pharmacokinetics: Liberation and absorption (onset of action): WNL Distribution (time to peak effect): WNL Metabolism and excretion (duration of action): WNL         Pharmacodynamics: Desired effects: Analgesia: [name] reports >50% benefit. Functional ability: Patient reports that medication allows her to accomplish basic ADLs Clinically meaningful improvement in function (CMIF): Sustained CMIF goals met Perceived effectiveness: Described as relatively effective, allowing for increase in activities of daily living (ADL) Undesirable effects: Side-effects or Adverse reactions: None reported Monitoring: [place] PMP: Online review of the past 95-monthperiod conducted. Compliant with practice rules and regulations Last UDS on record: Summary  Date Value Ref Range Status  06/27/2017 FINAL  Final    Comment:    ==================================================================== TOXASSURE SELECT 13 (MW) ==================================================================== Test                             Result       Flag       Units Drug Present and Declared for Prescription Verification   Oxycodone                      >7299        EXPECTED   ng/mg creat   Oxymorphone                    4171         EXPECTED   ng/mg creat   Noroxycodone                   >7299        EXPECTED   ng/mg creat   Noroxymorphone                 1593         EXPECTED   ng/mg creat    Sources of oxycodone are scheduled prescription medications.     Oxymorphone, noroxycodone, and noroxymorphone are expected    metabolites of oxycodone. Oxymorphone is also available as a    scheduled prescription medication. Drug Absent but Declared for Prescription Verification   Temazepam                      Not Detected UNEXPECTED ng/mg creat ==================================================================== Test                      Result    Flag   Units      Ref Range   Creatinine              137              mg/dL      >=20 ==================================================================== Declared Medications:  The flagging and interpretation on this report are based on the  following declared medications.  Unexpected results may arise from  inaccuracies in the declared medications.  **Note: The testing scope of this panel includes these medications:  Oxycodone (Oxycodone  Acetaminophen)  Temazepam  **Note: The testing scope of this panel does not include following  reported medications:  Acetaminophen  Acetaminophen (Oxycodone Acetaminophen)  Metoprolol  Ranitidine ==================================================================== For clinical consultation, please call [phone]. ====================================================================    UDS interpretation: Compliant          Medication Assessment Form: Reviewed. Patient indicates being compliant with therapy Treatment compliance: Compliant Risk Assessment Profile: Aberrant behavior: See prior evaluations. None observed or detected today Comorbid factors increasing risk of overdose: See prior notes. No additional risks detected today Opioid risk tool (ORT) (Total Score): 0 Personal History of Substance Abuse (SUD-Substance use disorder):  Alcohol: Negative  Illegal Drugs: Negative  Rx Drugs: Negative  ORT Risk Level calculation: Low Risk Risk of substance use disorder (SUD): Low Opioid Risk Tool - 02/18/18 0950      Family History of Substance Abuse    Alcohol  Negative    Illegal Drugs  Negative    Rx Drugs  Negative      Personal History of Substance Abuse   Alcohol  Negative    Illegal Drugs  Negative    Rx Drugs  Negative      Age   Age between 18-45 years   No      Psychological Disease   Psychological Disease  Negative      Total Score   Opioid Risk Tool Scoring  0    Opioid Risk Interpretation  Low Risk      ORT Scoring interpretation table:  Score <3 = Low Risk for SUD  Score between 4-7 = Moderate Risk for SUD  Score >8 = High Risk for Opioid Abuse   Risk Mitigation Strategies:  Patient Counseling: Covered Patient-Prescriber Agreement (PPA): Present and active  Notification to other healthcare providers: Done  Pharmacologic Plan: No change in therapy, at this time.             Laboratory Chemistry  Inflammation Markers (CRP: Acute Phase) (ESR: Chronic Phase) No results found for: CRP, ESRSEDRATE, LATICACIDVEN                       Rheumatology Markers No results found for: RF, ANA, LABURIC, URICUR, LYMEIGGIGMAB, LYMEABIGMQN, HLAB27                      Renal Function Markers Lab Results  Component Value Date   BUN 11 07/19/2017   CREATININE 0.83 07/19/2017   GFRAA >60 07/19/2017   GFRNONAA >60 07/19/2017                             Hepatic Function Markers Lab Results  Component Value Date   AST 17 03/24/2013   ALT 8 03/24/2013   ALBUMIN 3.6 03/24/2013   ALKPHOS 90 03/24/2013   LIPASE 25 03/24/2013                        Electrolytes Lab Results  Component Value Date   NA 138 07/19/2017   K 3.1 (L) 07/19/2017   CL 105 07/19/2017   CALCIUM 8.0 (L) 07/19/2017                        Neuropathy Markers No results found for: VITAMINB12, FOLATE, HGBA1C, HIV                      CNS  Tests No results found for: COLORCSF, APPEARCSF, RBCCOUNTCSF, WBCCSF, POLYSCSF, LYMPHSCSF, EOSCSF, PROTEINCSF, GLUCCSF, JCVIRUS, CSFOLI, IGGCSF                      Bone Pathology Markers No results found for:  VD25OH, MA263FH5KTG, YB6389HT3, SK8768TL5, 25OHVITD1, 25OHVITD2, 25OHVITD3, TESTOFREE, TESTOSTERONE                       Coagulation Parameters Lab Results  Component Value Date   PLT 288 07/19/2017                        Cardiovascular Markers Lab Results  Component Value Date   HGB 12.1 07/19/2017   HCT 37.3 07/19/2017                         CA Markers No results found for: CEA, CA125, LABCA2                      Note: Lab results reviewed.  Recent Diagnostic Imaging Results  DG C-Arm 1-60 Min-No Report Fluoroscopy was utilized by the requesting physician.  No radiographic  interpretation.   Complexity Note: Imaging results reviewed. Results shared with Ms. Sharen Counter, using Layman's terms.                         Meds   Current Outpatient Medications:  .  acetaminophen (TYLENOL) 500 MG tablet, Take 1,000 mg by mouth every 8 (eight) hours as needed for mild pain, moderate pain, fever or headache. , Disp: , Rfl:  .  ELIQUIS 5 MG TABS tablet, , Disp: , Rfl: 5 .  metoprolol succinate (TOPROL-XL) 100 MG 24 hr tablet, Take 100 mg by mouth daily. Take with or immediately following a meal., Disp: , Rfl:  .  oxyCODONE-acetaminophen (PERCOCET/ROXICET) 5-325 MG tablet, Take 1-2 tablets by mouth 2 (two) times daily as needed for severe pain., Disp: 75 tablet, Rfl: 0 .  RaNITidine HCl (ZANTAC PO), Take 1 tablet by mouth 3 (three) times daily. , Disp: , Rfl:  .  temazepam (RESTORIL) 30 MG capsule, Take 30 mg by mouth at bedtime as needed for sleep. , Disp: , Rfl:  .  [START ON 03/20/2018] oxyCODONE-acetaminophen (PERCOCET) 5-325 MG tablet, Take 1-2 tablets by mouth 2 (two) times daily., Disp: 75 tablet, Rfl: 0 .  [START ON 04/19/2018] oxyCODONE-acetaminophen (PERCOCET) 5-325 MG tablet, Take 1-2 tablets by mouth 2 (two) times daily., Disp: 75 tablet, Rfl: 0  ROS  Constitutional: Denies any fever or chills Gastrointestinal: No reported hemesis, hematochezia, vomiting, or acute GI  distress Musculoskeletal: Denies any acute onset joint swelling, redness, loss of ROM, or weakness Neurological: No reported episodes of acute onset apraxia, aphasia, dysarthria, agnosia, amnesia, paralysis, loss of coordination, or loss of consciousness  Allergies  Ms. Turbin is allergic to buprenorphine; cefaclor; gabapentin; ibuprofen; levofloxacin; macrobid [nitrofurantoin monohyd macro]; naproxen; and penicillins.  [place]  Drug: Ms. Deeds  reports that she does not use drugs. Alcohol:  reports that she does not drink alcohol. Tobacco:  reports that she has been smoking cigarettes. She has a 20.00 pack-year smoking history. She has never used smokeless tobacco. Medical:  has a past medical history of Chronic back pain, Hypertension, Migraine, and Plumbism. Surgical: Ms. Wysong  has a past surgical history that includes Abdominal hysterectomy; Cholecystectomy; and Hand surgery. Family: family history includes Cancer  in her other; Diabetes in her other; Seizures in her other; Stroke in her other.  Constitutional Exam  General appearance: Well nourished, well developed, and well hydrated. In no apparent acute distress Vitals:   02/18/18 0938  BP: (!) 130/112  Pulse: 79  Resp: 16  Temp: 98.2 F (36.8 C)  SpO2: 100%  Weight: 115 lb (52.2 kg)  Height: 5' 1" (1.549 m)   BMI Assessment: Estimated body mass index is 21.73 kg/m as calculated from the following:   Height as of this encounter: 5' 1" (1.549 m).   Weight as of this encounter: 115 lb (52.2 kg). Psych/Mental status: Alert, oriented x 3 (person, place, & time)       Eyes: PERLA Respiratory: No evidence of acute respiratory distress Muscle strength & Tone: No palpable anomalies  Lumbar Spine Area Exam  Skin & Axial Inspection: No masses, redness, or swelling Alignment: Symmetrical Functional ROM: Unrestricted ROM       Stability: No instability detected Muscle Tone/Strength: Functionally intact. No obvious neuro-muscular  anomalies detected. Sensory (Neurological): Unimpaired Palpation: Complains of area being tender to palpation       Provocative Tests: Hyperextension/rotation test: (+) bilaterally for facet joint pain. Lumbar quadrant test (Kemp's test): deferred today       Lateral bending test: (+)       Patrick's Maneuver: deferred today                    Gait & Posture Assessment  Ambulation: Unassisted Gait: Relatively normal for age and body habitus Posture: WNL   Lower Extremity Exam    Side: Right lower extremity  Side: Left lower extremity  Stability: No instability observed          Stability: No instability observed          Skin & Extremity Inspection: Skin color, temperature, and hair growth are WNL. No peripheral edema or cyanosis. No masses, redness, swelling, asymmetry, or associated skin lesions. No contractures.  Skin & Extremity Inspection: Skin color, temperature, and hair growth are WNL. No peripheral edema or cyanosis. No masses, redness, swelling, asymmetry, or associated skin lesions. No contractures.  Functional ROM: Unrestricted ROM                  Functional ROM: Unrestricted ROM                  Muscle Tone/Strength: Functionally intact. No obvious neuro-muscular anomalies detected.  Muscle Tone/Strength: Functionally intact. No obvious neuro-muscular anomalies detected.  Sensory (Neurological): Unimpaired  Sensory (Neurological): Unimpaired  Palpation: No palpable anomalies  Palpation: No palpable anomalies   Assessment  Primary Diagnosis & Pertinent Problem List: The primary encounter diagnosis was Lumbar spondylosis. Diagnoses of Chronic bilateral low back pain with bilateral sciatica, Lumbar radiculopathy, Long term prescription opiate use, and Chronic pain syndrome were also pertinent to this visit.  Status Diagnosis  Persistent Persistent Persistent 1. Lumbar spondylosis   2. Chronic bilateral low back pain with bilateral sciatica   3. Lumbar radiculopathy   4.  Long term prescription opiate use   5. Chronic pain syndrome     Problems updated and reviewed during this visit: No problems updated. Plan of Care  Pharmacotherapy (Medications Ordered): Meds ordered this encounter  Medications  . oxyCODONE-acetaminophen (PERCOCET/ROXICET) 5-325 MG tablet    Sig: Take 1-2 tablets by mouth 2 (two) times daily as needed for severe pain.    Dispense:  75 tablet    Refill:  0    Do not add this medication to the electronic "Automatic Refill" notification system. Patient may have prescription filled one day early if pharmacy is closed on scheduled refill date.    Order Specific Question:   Supervising Provider    Answer:   Milinda Pointer [phone]  . oxyCODONE-acetaminophen (PERCOCET) 5-325 MG tablet    Sig: Take 1-2 tablets by mouth 2 (two) times daily.    Dispense:  75 tablet    Refill:  0    Do not place this medication, or any other prescription from our practice, on "Automatic Refill". Patient may have prescription filled one day early if pharmacy is closed on scheduled refill date.    Order Specific Question:   Supervising Provider    Answer:   Milinda Pointer [phone]  . oxyCODONE-acetaminophen (PERCOCET) 5-325 MG tablet    Sig: Take 1-2 tablets by mouth 2 (two) times daily.    Dispense:  75 tablet    Refill:  0    Do not place this medication, or any other prescription from our practice, on "Automatic Refill". Patient may have prescription filled one day early if pharmacy is closed on scheduled refill date.    Order Specific Question:   Supervising Provider    Answer:   Milinda Pointer [phone]   New Prescriptions   OXYCODONE-ACETAMINOPHEN (PERCOCET) 5-325 MG TABLET    Take 1-2 tablets by mouth 2 (two) times daily.   OXYCODONE-ACETAMINOPHEN (PERCOCET) 5-325 MG TABLET    Take 1-2 tablets by mouth 2 (two) times daily.   Medications administered today: [name] had no medications administered during this visit. Lab-work,  procedure(s), and/or referral(s): Orders Placed This Encounter  Procedures  . ToxASSURE Select 13 (MW), Urine   Imaging and/or referral(s): None  Interventional therapies: Planned, scheduled, and/or pending:   Not at this time.  Instructed patient on the use of anticoagulants and interventional therapy.  She admits that she understands.  Provider-requested follow-up: Return in about 3 months (around 05/21/2018) for MedMgmt.  Future Appointments  Date Time Provider [place]  05/15/2018  9:30 AM Gillis [place], MD [facility] None   Primary Care Physician: [facility] Location: [facility] Outpatient Pain Management Facility Note by: Vevelyn Francois NP Date: 02/18/2018; Time: 12:49 PM  Pain Score Disclaimer: We use the NRS-11 scale. This is a self-reported, subjective measurement of pain severity with only modest accuracy. It is used primarily to identify changes within a particular patient. It must be understood that outpatient pain scales are significantly less accurate that those used for research, where they can be applied under ideal controlled circumstances with minimal exposure to variables. In reality, the score is likely to be a combination of pain intensity and pain affect, where pain affect describes the degree of emotional arousal or changes in action readiness caused by the sensory experience of pain. Factors such as social and work situation, setting, emotional state, anxiety levels, expectation, and prior pain experience may influence pain perception and show large inter-individual differences that may also be affected by time variables.  Patient instructions provided during this appointment: Patient Instructions   ____________________________________________________________________________________________  Medication Rules  Applies to: All patients receiving prescriptions (written or electronic).  Pharmacy of record: Pharmacy where electronic  prescriptions will be sent. If written prescriptions are taken to a different pharmacy, please inform the nursing staff. The pharmacy listed in the electronic medical record should be the one where you would like electronic prescriptions to be sent.  Prescription refills: Only during scheduled appointments. Applies to both, written and electronic prescriptions.  NOTE: The following applies primarily to controlled substances (Opioid* Pain Medications).   Patient's responsibilities: 1. Pain Pills: Bring all pain pills to every appointment (except for procedure appointments). 2. Pill Bottles: Bring pills in original pharmacy bottle. Always bring newest bottle. Bring bottle, even if empty. 3. Medication refills: You are responsible for knowing and keeping track of what medications you need refilled. The day before your appointment, write a list of all prescriptions that need to be refilled. Bring that list to your appointment and give it to the admitting nurse. Prescriptions will be written only during appointments. If you forget a medication, it will not be "Called in", "Faxed", or "electronically sent". You will need to get another appointment to get these prescribed. 4. Prescription Accuracy: You are responsible for carefully inspecting your prescriptions before leaving our office. Have the discharge nurse carefully go over each prescription with you, before taking them home. Make sure that your name is accurately spelled, that your address is correct. Check the name and dose of your medication to make sure it is accurate. Check the number of pills, and the written instructions to make sure they are clear and accurate. Make sure that you are given enough medication to last until your next medication refill appointment. 5. Taking Medication: Take medication as prescribed. Never take more pills than instructed. Never take medication more frequently than prescribed. Taking less pills or less frequently is  permitted and encouraged, when it comes to controlled substances (written prescriptions).  6. Inform other Doctors: Always inform, all of your healthcare providers, of all the medications you take. 7. Pain Medication from other Providers: You are not allowed to accept any additional pain medication from any other Doctor or Healthcare provider. There are two exceptions to this rule. (see below) In the event that you require additional pain medication, you are responsible for notifying us, as stated below. 8. Medication Agreement: You are responsible for carefully reading and following our Medication Agreement. This must be signed before receiving any prescriptions from our practice. Safely store a copy of your signed Agreement. Violations to the Agreement will result in no further prescriptions. (Additional copies of our Medication Agreement are available upon request.) 9. Laws, Rules, & Regulations: All patients are expected to follow all Federal and [employer], Rules, [employer]. Ignorance of the Laws does not constitute a valid excuse. The use of any illegal substances is prohibited. 10. Adopted CDC guidelines & recommendations: Target dosing levels will be at or below 60 MME/day. Use of benzodiazepines** is not recommended.  Exceptions: There are only two exceptions to the rule of not receiving pain medications from other Healthcare Providers. 1. Exception #1 (Emergencies): In the event of an emergency (i.e.: accident requiring emergency care), you are allowed to receive additional pain medication. However, you are responsible for: As soon as you are able, call our office [phone], at any time of the day or night, and leave a message stating your name, the date and nature of the emergency, and the name and dose of the medication prescribed. In the event that your call is answered by a member of our staff, make sure to document and save the date, time, and the name of the person that  took your information.  2. Exception #2 (Planned Surgery): In the event that you are scheduled by another doctor or dentist to have any type of surgery or procedure, you  are allowed (for a period no longer than 30 days), to receive additional pain medication, for the acute post-op pain. However, in this case, you are responsible for picking up a copy of our "Post-op Pain Management for Surgeons" handout, and giving it to your surgeon or dentist. This document is available at our office, and does not require an appointment to obtain it. Simply go to our office during business hours (Monday-Thursday from 8:00 AM to 4:00 PM) (Friday 8:00 AM to 12:00 Noon) or if you have a scheduled appointment with [place], prior to your surgery, and ask for it by name. In addition, you will need to provide [place] with your name, name of your surgeon, type of surgery, and date of procedure or surgery.  *Opioid medications include: morphine, codeine, oxycodone, oxymorphone, hydrocodone, hydromorphone, meperidine, tramadol, tapentadol, buprenorphine, fentanyl, methadone. **Benzodiazepine medications include: diazepam (Valium), alprazolam (Xanax), clonazepam (Klonopine), lorazepam (Ativan), clorazepate (Tranxene), chlordiazepoxide (Librium), estazolam (Prosom), oxazepam (Serax), temazepam (Restoril), triazolam (Halcion) (Last updated: 07/11/2017) ____________________________________________________________________________________________   ____________________________________________________________________________________________  Pain Scale  Introduction: The pain score used by this practice is the Verbal Numerical Rating Scale (VNRS-11). This is an 11-point scale. It is for adults and children 10 years or older. There are significant differences in how the pain score is reported, used, and applied. Forget everything you learned in the past and learn this scoring system.  General Information: The scale should reflect your current  level of pain. Unless you are specifically asked for the level of your worst pain, or your average pain. If you are asked for one of these two, then it should be understood that it is over the past 24 hours.  Basic Activities of Daily Living (ADL): Personal hygiene, dressing, eating, transferring, and using restroom.  Instructions: Most patients tend to report their level of pain as a combination of two factors, their physical pain and their psychosocial pain. This last one is also known as "suffering" and it is reflection of how physical pain affects you socially and psychologically. From now on, report them separately. From this point on, when asked to report your pain level, report only your physical pain. Use the following table for reference.  Pain Clinic Pain Levels (0-5/10)  Pain Level Score  Description  No Pain 0   Mild pain 1 Nagging, annoying, but does not interfere with basic activities of daily living (ADL). Patients are able to eat, bathe, get dressed, toileting (being able to get on and off the toilet and perform personal hygiene functions), transfer (move in and out of bed or a chair without assistance), and maintain continence (able to control bladder and bowel functions). Blood pressure and heart rate are unaffected. A normal heart rate for a healthy adult ranges from 60 to 100 bpm (beats per minute).   Mild to moderate pain 2 Noticeable and distracting. Impossible to hide from other people. More frequent flare-ups. Still possible to adapt and function close to normal. It can be very annoying and may have occasional stronger flare-ups. With discipline, patients may get used to it and adapt.   Moderate pain 3 Interferes significantly with activities of daily living (ADL). It becomes difficult to feed, bathe, get dressed, get on and off the toilet or to perform personal hygiene functions. Difficult to get in and out of bed or a chair without assistance. Very distracting. With effort, it  can be ignored when deeply involved in activities.   Moderately severe pain 4 Impossible to ignore for more than a few minutes.  With effort, patients may still be able to manage work or participate in some social activities. Very difficult to concentrate. Signs of autonomic nervous system discharge are evident: dilated pupils (mydriasis); mild sweating (diaphoresis); sleep interference. Heart rate becomes elevated (>115 bpm). Diastolic blood pressure (lower number) rises above 100 mmHg. Patients find relief in laying down and not moving.   Severe pain 5 Intense and extremely unpleasant. Associated with frowning face and frequent crying. Pain overwhelms the senses.  Ability to do any activity or maintain social relationships becomes significantly limited. Conversation becomes difficult. Pacing back and forth is common, as getting into a comfortable position is nearly impossible. Pain wakes you up from deep sleep. Physical signs will be obvious: pupillary dilation; increased sweating; goosebumps; brisk reflexes; cold, clammy hands and feet; nausea, vomiting or dry heaves; loss of appetite; significant sleep disturbance with inability to fall asleep or to remain asleep. When persistent, significant weight loss is observed due to the complete loss of appetite and sleep deprivation.  Blood pressure and heart rate becomes significantly elevated. Caution: If elevated blood pressure triggers a pounding headache associated with blurred vision, then the patient should immediately seek attention at an urgent or emergency care unit, as these may be signs of an impending stroke.    Emergency Department Pain Levels (6-10/10)  Emergency Room Pain 6 Severely limiting. Requires emergency care and should not be seen or managed at an outpatient pain management facility. Communication becomes difficult and requires great effort. Assistance to reach the emergency department may be required. Facial flushing and profuse sweating  along with potentially dangerous increases in heart rate and blood pressure will be evident.   Distressing pain 7 Self-care is very difficult. Assistance is required to transport, or use restroom. Assistance to reach the emergency department will be required. Tasks requiring coordination, such as bathing and getting dressed become very difficult.   Disabling pain 8 Self-care is no longer possible. At this level, pain is disabling. The individual is unable to do even the most "basic" activities such as walking, eating, bathing, dressing, transferring to a bed, or toileting. Fine motor skills are lost. It is difficult to think clearly.   Incapacitating pain 9 Pain becomes incapacitating. Thought processing is no longer possible. Difficult to remember your own name. Control of movement and coordination are lost.   The worst pain imaginable 10 At this level, most patients pass out from pain. When this level is reached, collapse of the autonomic nervous system occurs, leading to a sudden drop in blood pressure and heart rate. This in turn results in a temporary and dramatic drop in blood flow to the brain, leading to a loss of consciousness. Fainting is one of the body's self defense mechanisms. Passing out puts the brain in a calmed state and causes it to shut down for a while, in order to begin the healing process.    Summary: 1. Refer to this scale when providing [place] with your pain level. 2. Be accurate and careful when reporting your pain level. This will help with your care. 3. Over-reporting your pain level will lead to loss of credibility. 4. Even a level of 1/10 means that there is pain and will be treated at our facility. 5. High, inaccurate reporting will be documented as "Symptom Exaggeration", leading to loss of credibility and suspicions of possible secondary gains such as obtaining more narcotics, or wanting to appear disabled, for fraudulent reasons. 6. Only pain levels of 5 or below will  be  seen at our facility. 7. Pain levels of 6 and above will be sent to the Emergency Department and the appointment cancelled. ____________________________________________________________________________________________

## 2018-02-23 LAB — TOXASSURE SELECT 13 (MW), URINE

## 2018-05-15 ENCOUNTER — Encounter: Payer: BLUE CROSS/BLUE SHIELD | Admitting: Student in an Organized Health Care Education/Training Program

## 2018-05-15 ENCOUNTER — Other Ambulatory Visit: Payer: Self-pay

## 2018-05-15 ENCOUNTER — Encounter (HOSPITAL_COMMUNITY): Payer: Self-pay

## 2018-05-15 ENCOUNTER — Emergency Department (HOSPITAL_COMMUNITY)
Admission: EM | Admit: 2018-05-15 | Discharge: 2018-05-16 | Disposition: A | Discharge status: Home / Self Care | Payer: BLUE CROSS/BLUE SHIELD | Attending: Emergency Medicine | Admitting: Emergency Medicine

## 2018-05-15 DIAGNOSIS — I10 Essential (primary) hypertension: Secondary | ICD-10-CM | POA: Insufficient documentation

## 2018-05-15 DIAGNOSIS — R072 Precordial pain: Secondary | ICD-10-CM | POA: Diagnosis not present

## 2018-05-15 DIAGNOSIS — R091 Pleurisy: Secondary | ICD-10-CM | POA: Insufficient documentation

## 2018-05-15 DIAGNOSIS — Z7901 Long term (current) use of anticoagulants: Secondary | ICD-10-CM | POA: Insufficient documentation

## 2018-05-15 DIAGNOSIS — Z79899 Other long term (current) drug therapy: Secondary | ICD-10-CM | POA: Diagnosis not present

## 2018-05-15 DIAGNOSIS — F17210 Nicotine dependence, cigarettes, uncomplicated: Secondary | ICD-10-CM | POA: Insufficient documentation

## 2018-05-15 DIAGNOSIS — R079 Chest pain, unspecified: Secondary | ICD-10-CM | POA: Diagnosis present

## 2018-05-15 NOTE — ED Triage Notes (Signed)
Pt reports onset of mid sternal chest pain with some occasional shooting pain into the left arm.  Pt reports pain is worse with deep breath and movement.

## 2018-05-16 ENCOUNTER — Emergency Department (HOSPITAL_COMMUNITY): Payer: BLUE CROSS/BLUE SHIELD

## 2018-05-16 LAB — CBC WITH DIFFERENTIAL/PLATELET
Abs Immature Granulocytes: 0.03 10*3/uL (ref 0.00–0.07)
Basophils Absolute: 0.1 10*3/uL (ref 0.0–0.1)
Basophils Relative: 1 %
Eosinophils Absolute: 0.1 10*3/uL (ref 0.0–0.5)
Eosinophils Relative: 1 %
HCT: 35.2 % — ABNORMAL LOW (ref 36.0–46.0)
Hemoglobin: 10.7 g/dL — ABNORMAL LOW (ref 12.0–15.0)
Immature Granulocytes: 0 %
Lymphocytes Relative: 32 %
Lymphs Abs: 2.4 10*3/uL (ref 0.7–4.0)
MCH: 27.8 pg (ref 26.0–34.0)
MCHC: 30.4 g/dL (ref 30.0–36.0)
MCV: 91.4 fL (ref 80.0–100.0)
Monocytes Absolute: 0.7 10*3/uL (ref 0.1–1.0)
Monocytes Relative: 9 %
Neutro Abs: 4.4 10*3/uL (ref 1.7–7.7)
Neutrophils Relative %: 57 %
Platelets: 469 10*3/uL — ABNORMAL HIGH (ref 150–400)
RBC: 3.85 MIL/uL — ABNORMAL LOW (ref 3.87–5.11)
RDW: 16.1 % — ABNORMAL HIGH (ref 11.5–15.5)
WBC: 7.6 10*3/uL (ref 4.0–10.5)
nRBC: 0 % (ref 0.0–0.2)

## 2018-05-16 LAB — BASIC METABOLIC PANEL WITH GFR
Anion gap: 11 (ref 5–15)
BUN: 7 mg/dL (ref 6–20)
CO2: 21 mmol/L — ABNORMAL LOW (ref 22–32)
Calcium: 8.9 mg/dL (ref 8.9–10.3)
Chloride: 108 mmol/L (ref 98–111)
Creatinine, Ser: 0.75 mg/dL (ref 0.44–1.00)
GFR calc Af Amer: >60 mL/min
GFR calc non Af Amer: >60 mL/min
Glucose, Bld: 98 mg/dL (ref 70–99)
Potassium: 3 mmol/L — ABNORMAL LOW (ref 3.5–5.1)
Sodium: 140 mmol/L (ref 135–145)

## 2018-05-16 LAB — D-DIMER, QUANTITATIVE: D-Dimer, Quant: <0.27 ug/mL-FEU (ref 0.00–0.50)

## 2018-05-16 IMAGING — DX DG CHEST 2V
2 series · 2 of 2 positions shown · non-contrast
Comparison: None.

CLINICAL DATA: Chest pain.

EXAM:
CHEST - 2 VIEW

[chest lat]
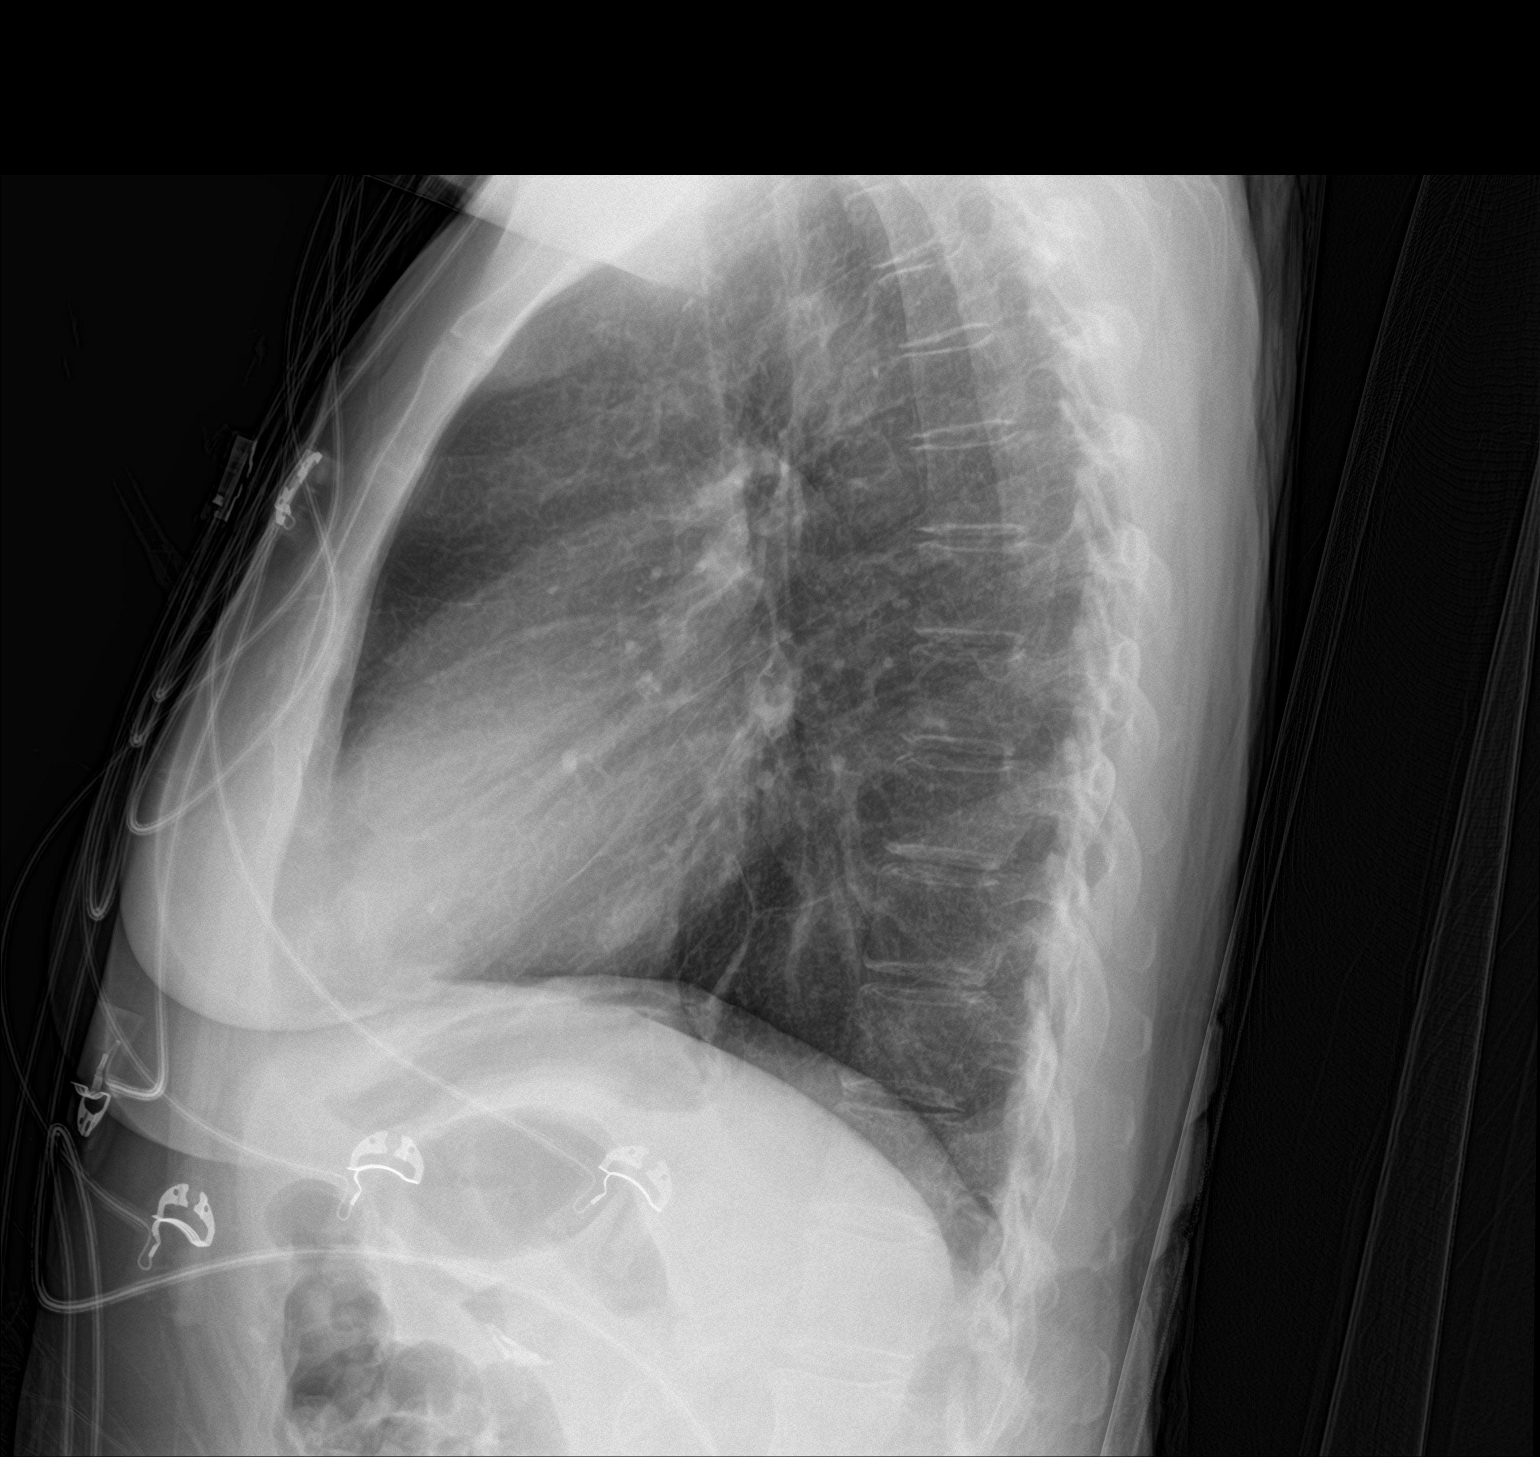

[chest ap]
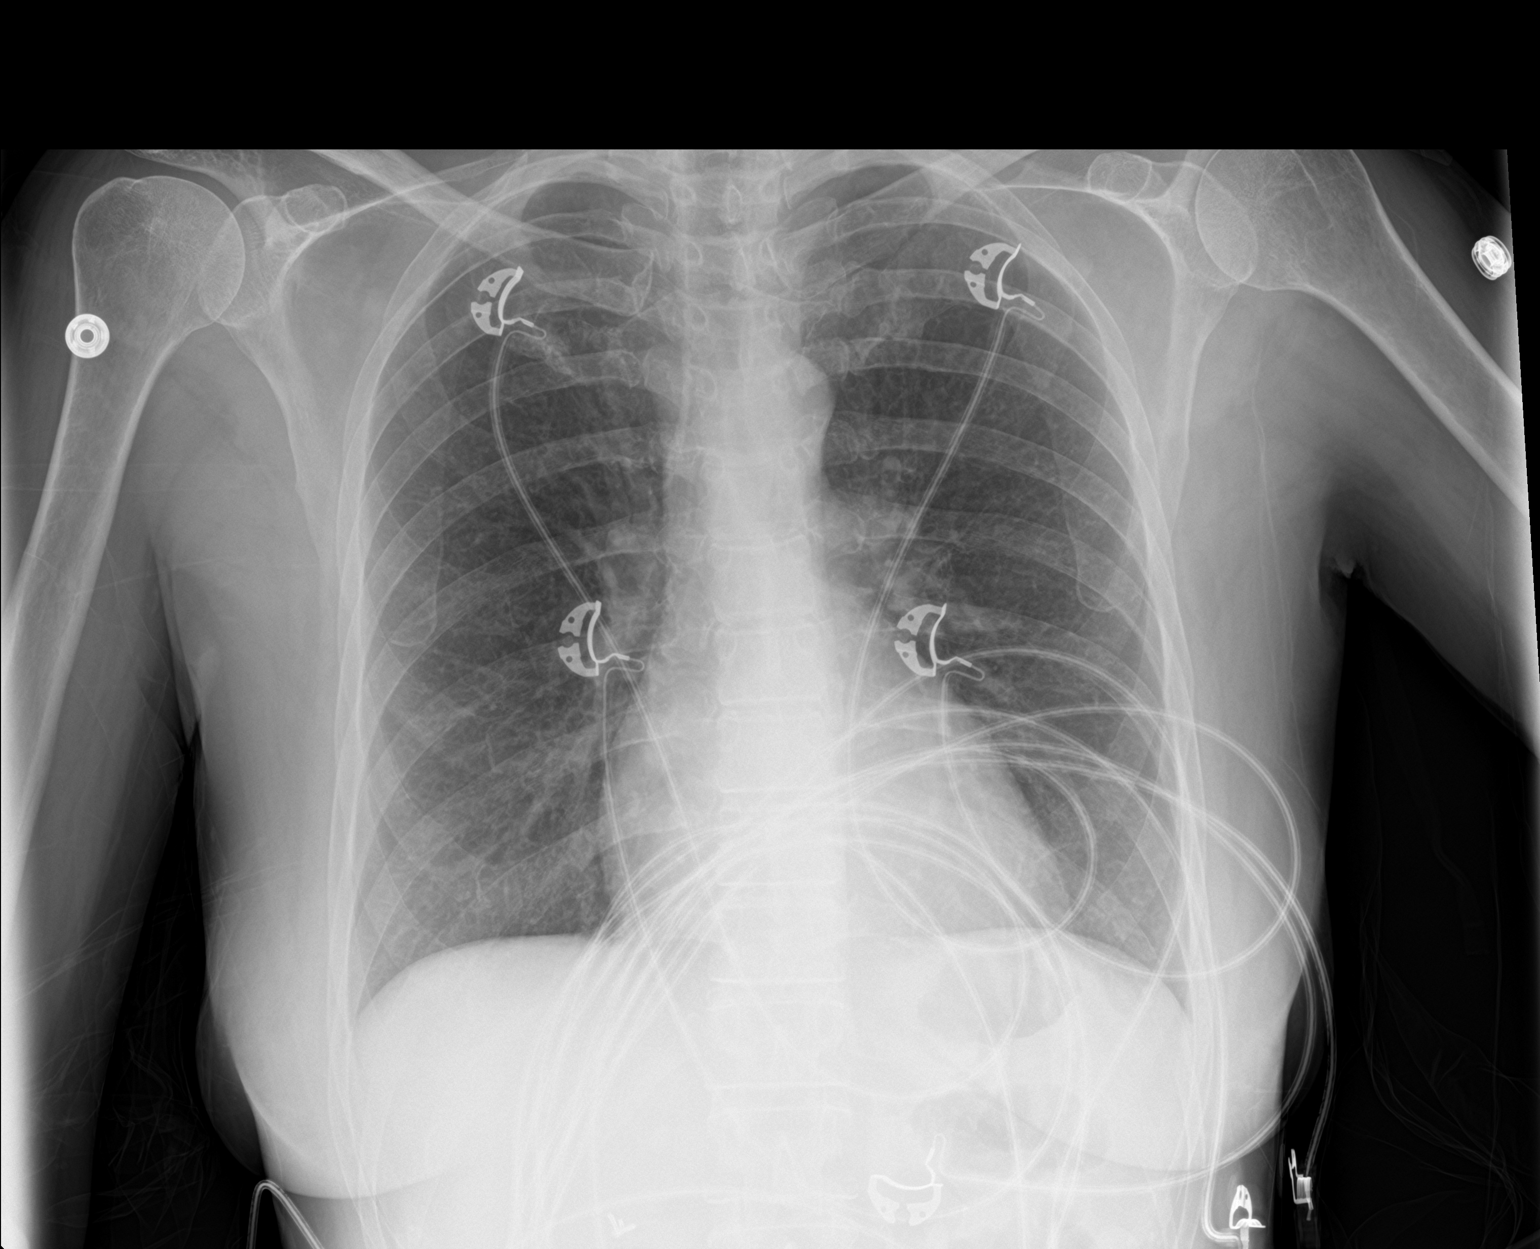

[2 of 2 positions shown; findings below may reference images not displayed]

FINDINGS: The cardiomediastinal contours are normal. The lungs are clear.
Pulmonary vasculature is normal. No consolidation, pleural effusion,
or pneumothorax. No acute osseous abnormalities are seen.
IMPRESSION: No acute chest findings.

## 2018-05-16 MED ORDER — FENTANYL CITRATE (PF) 100 MCG/2ML IJ SOLN
100.0000 ug | Freq: Once | INTRAMUSCULAR | Status: AC
  Administered 2018-05-16: 100 ug via INTRAVENOUS
  Filled 2018-05-16: qty 2

## 2018-05-16 NOTE — ED Provider Notes (Signed)
[facility] EMERGENCY DEPARTMENT Provider Note   CSN: [id number] Arrival date & time: 05/15/18  2318     History   Chief Complaint Chief Complaint  Patient presents with  . Chest Pain    HPI [name] is a 48 y.o. female.  The history is provided by the patient and the spouse.  Chest Pain   This is a new problem. The current episode started 3 to 5 hours ago. The problem occurs constantly. The problem has been gradually improving. The pain is present in the substernal region. The pain is moderate. The quality of the pain is described as sharp. The pain does not radiate. Associated symptoms include shortness of breath. Pertinent negatives include no cough, no fever and no vomiting.  Patient presents with chest pain.  She reports several hours ago while she was at rest at home she had onset of sharp chest pain.  It hurts worse with movement, palpation, deep breathing.  No fevers or vomiting.  She does report shortness of breath. She had otherwise been well. No cough. No Hemoptysis reported today  Past Medical History:  Diagnosis Date  . Chronic back pain   . Hypertension   . Migraine   . Plumbism    blood clot     Patient Active Problem List   Diagnosis Date Noted  . Lumbar radiculopathy 02/04/2017  . Lumbar degenerative disc disease 02/04/2017  . Lumbar spondylosis 02/04/2017  . Chronic low back pain with bilateral sciatica 02/04/2017  . Chronic pain syndrome 02/04/2017  . Lumbar sprain 02/04/2017  . Chronic migraine without aura without status migrainosus, not intractable 02/04/2017    Past Surgical History:  Procedure Laterality Date  . ABDOMINAL HYSTERECTOMY    . CHOLECYSTECTOMY    . HAND SURGERY       OB History    Gravida  2   Para  2   Term  2   Preterm      AB      Living  2     SAB      TAB      Ectopic      Multiple      Live Births               Home Medications    Prior to Admission medications   Medication Sig Start  Date End Date Taking? Authorizing Provider  acetaminophen (TYLENOL) 500 MG tablet Take 1,000 mg by mouth every 8 (eight) hours as needed for mild pain, moderate pain, fever or headache.     [provider]  ELIQUIS 5 MG TABS tablet  01/31/18   [provider]  metoprolol succinate (TOPROL-XL) 100 MG 24 hr tablet Take 100 mg by mouth daily. Take with or immediately following a meal.    [provider]  oxyCODONE-acetaminophen (PERCOCET) 5-325 MG tablet Take 1-2 tablets by mouth 2 (two) times daily. 04/19/18 05/19/18  Vevelyn Francois, NP  RaNITidine HCl (ZANTAC PO) Take 1 tablet by mouth 3 (three) times daily.  06/24/07   [provider]  temazepam (RESTORIL) 30 MG capsule Take 30 mg by mouth at bedtime as needed for sleep.  01/17/16   [provider]    Family History Family History  Problem Relation Age of Onset  . Cancer Other   . Seizures Other   . Stroke Other   . Diabetes Other     Social History Social History   Tobacco Use  . Smoking status: Current  Every Day Smoker    Packs/day: 1.00    Years: 20.00    Pack years: 20.00    Types: Cigarettes  . Smokeless tobacco: Never Used  Substance Use Topics  . Alcohol use: No  . Drug use: No     Allergies   Buprenorphine; Cefaclor; Gabapentin; Ibuprofen; Levofloxacin; Macrobid [nitrofurantoin monohyd macro]; Naproxen; and Penicillins   Review of Systems Review of Systems  Constitutional: Negative for fever.  Respiratory: Positive for shortness of breath. Negative for cough.   Cardiovascular: Positive for chest pain.  Gastrointestinal: Negative for vomiting.  All other systems reviewed and are negative.    Physical Exam Updated Vital Signs BP 140/86 (BP Location: Right Arm)   Pulse 79   Temp 97.8 F (36.6 C) (Oral)   Resp (!) 21   Ht 1.549 m (5\' 1" )   Wt 49.9 kg   SpO2 100%   BMI 20.78 kg/m   Physical Exam CONSTITUTIONAL: Well developed/well nourished HEAD:  Normocephalic/atraumatic EYES: EOMI/PERRL ENMT: Mucous membranes moist NECK: supple no meningeal signs SPINE/BACK:entire spine nontender CV: S1/S2 noted, no murmurs/rubs/gallops noted LUNGS: Lungs are clear to auscultation bilaterally, no apparent distress Chest-central chest wall tenderness without crepitus or bruising ABDOMEN: soft, nontender, no rebound or guarding, bowel sounds noted throughout abdomen GU:no cva tenderness NEURO: Pt is awake/alert/appropriate, moves all extremitiesx4.  No facial droop.   EXTREMITIES: pulses normal/equal, full ROM, no lower extremity edema or tenderness SKIN: warm, color normal PSYCH: no abnormalities of mood noted, alert and oriented to situation   ED Treatments / Results  Labs (all labs ordered are listed, but only abnormal results are displayed) Labs Reviewed  BASIC METABOLIC PANEL - Abnormal; Notable for the following components:      Result Value   Potassium 3.0 (*)    CO2 21 (*)    All other components within normal limits  CBC WITH DIFFERENTIAL/PLATELET - Abnormal; Notable for the following components:   RBC 3.85 (*)    Hemoglobin 10.7 (*)    HCT 35.2 (*)    RDW 16.1 (*)    Platelets 469 (*)    All other components within normal limits  D-DIMER, QUANTITATIVE (NOT AT [facility])    EKG ED ECG REPORT   Date: 05/15/2018 2336  Rate: 86  Rhythm: normal sinus rhythm  QRS Axis: normal  Intervals: normal  ST/T Wave abnormalities: normal  Conduction Disutrbances:none    I have personally reviewed the EKG tracing and agree with the computerized printout as noted.  Radiology Dg Chest 2 View  Result Date: 05/16/2018 CLINICAL DATA:  Chest pain. EXAM: CHEST - 2 VIEW COMPARISON:  None. FINDINGS: The cardiomediastinal contours are normal. The lungs are clear. Pulmonary vasculature is normal. No consolidation, pleural effusion, or pneumothorax. No acute osseous abnormalities are seen. IMPRESSION: No acute chest findings. Electronically Signed    By: Keith Rake M.D.   On: 05/16/2018 01:04    Procedures Procedures (including critical care time)  Medications Ordered in ED Medications  fentaNYL (SUBLIMAZE) injection 100 mcg (100 mcg Intravenous Given 05/16/18 0149)     Initial Impression / Assessment and Plan / ED Course  I have reviewed the triage vital signs and the nursing notes.  Pertinent labs & imaging results that were available during my care of the patient were reviewed by me and considered in my medical decision making (see chart for details).     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  PE  was diagnosed last year at an outside hospital.  She reports taking daily Eliquis without recent missed doses.  Her pain currently is very reproducible on palpation.  There is no hypoxia or tachycardia.  D-dimer is negative with a very low Wells score, is only positive finding his previous PE.   Patient overall appears improved.  She ambulated without difficulty, no hypoxia no tachycardia.  She is consistently tender to palpation in the central part of her chest.  That is the only area of pain. Work-up in the emergency department is unremarkable. Will discharge home. She was advised to continue her left Eliquis.  Final Clinical Impressions(s) / ED Diagnoses   Final diagnoses:  Precordial pain  Pleurisy    ED Discharge Orders    None       Ripley Fraise, MD 05/16/18 [phone]

## 2018-05-16 NOTE — ED Notes (Signed)
Pt ambulated aruond unit per MD request. Pt in NAD. 98% on RA. 88 BPM. EDP notified of the same

## 2018-05-19 ENCOUNTER — Encounter: Payer: Self-pay | Admitting: Student in an Organized Health Care Education/Training Program

## 2018-05-19 ENCOUNTER — Ambulatory Visit
Payer: BLUE CROSS/BLUE SHIELD | Attending: Student in an Organized Health Care Education/Training Program | Admitting: Student in an Organized Health Care Education/Training Program

## 2018-05-19 ENCOUNTER — Other Ambulatory Visit: Payer: Self-pay

## 2018-05-19 VITALS — BP 132/70 | HR 78 | Temp 98.4°F | Resp 18 | Ht 61.0 in | Wt 110.0 lb

## 2018-05-19 DIAGNOSIS — M47816 Spondylosis without myelopathy or radiculopathy, lumbar region: Secondary | ICD-10-CM | POA: Insufficient documentation

## 2018-05-19 DIAGNOSIS — Z79891 Long term (current) use of opiate analgesic: Secondary | ICD-10-CM | POA: Insufficient documentation

## 2018-05-19 DIAGNOSIS — M47818 Spondylosis without myelopathy or radiculopathy, sacral and sacrococcygeal region: Secondary | ICD-10-CM | POA: Diagnosis present

## 2018-05-19 DIAGNOSIS — G894 Chronic pain syndrome: Secondary | ICD-10-CM | POA: Diagnosis not present

## 2018-05-19 DIAGNOSIS — M5136 Other intervertebral disc degeneration, lumbar region: Secondary | ICD-10-CM | POA: Insufficient documentation

## 2018-05-19 DIAGNOSIS — G8929 Other chronic pain: Secondary | ICD-10-CM | POA: Diagnosis present

## 2018-05-19 DIAGNOSIS — M5442 Lumbago with sciatica, left side: Secondary | ICD-10-CM | POA: Diagnosis present

## 2018-05-19 DIAGNOSIS — M5441 Lumbago with sciatica, right side: Secondary | ICD-10-CM | POA: Insufficient documentation

## 2018-05-19 MED ORDER — OXYCODONE-ACETAMINOPHEN 5-325 MG PO TABS: 1.0000 | ORAL_TABLET | Freq: Two times a day (BID) | ORAL | 0 refills | Status: DC | PRN

## 2018-05-19 NOTE — Progress Notes (Signed)
Nursing Pain Medication Assessment:  Safety precautions to be maintained throughout the outpatient stay will include: orient to surroundings, keep bed in low position, maintain call bell within reach at all times, provide assistance with transfer out of bed and ambulation.  Medication Inspection Compliance: Pill count conducted under aseptic conditions, in front of the patient. Neither the pills nor the bottle was removed from the patient's sight at any time. Once count was completed pills were immediately returned to the patient in their original bottle.  Medication: Oxycodone/APAP Pill/Patch Count: 0 of 75 pills remain Pill/Patch Appearance: Markings consistent with prescribed medication Bottle Appearance: Standard pharmacy container. Clearly labeled. Filled Date: 47 / 07 / 2019 Last Medication intake:  Today

## 2018-05-19 NOTE — Progress Notes (Signed)
Patient's Name: [name]  MRN: [id number]  Referring Provider: [facility]  DOB: 03-05-1971  PCP: [facility]  DOS: 05/19/2018  Note by: Gillis Santa, MD  Service setting: Ambulatory outpatient  Specialty: Interventional Pain Management  Location: [facility] (AMB) Pain Management Facility    Patient type: Established   Primary Reason(s) for Visit: Encounter for prescription drug management. (Level of risk: moderate)  CC: Back Pain (lower) and Leg Pain (bilateral, left is worse)  HPI  [name] is a 48 y.o. year old, female patient, who comes today for a medication management evaluation. She has Lumbar radiculopathy; Lumbar degenerative disc disease; Lumbar spondylosis; Chronic low back pain with bilateral sciatica; Chronic pain syndrome; Lumbar sprain; Chronic migraine without aura without status migrainosus, not intractable; SI joint arthritis; and Long term prescription opiate use on their problem list. Her primarily concern today is the Back Pain (lower) and Leg Pain (bilateral, left is worse)  Pain Assessment: Location: Lower, Right, Left Back Radiating: down legs; right side to knee, left side to big toe Onset: More than a month ago Duration: Chronic pain Quality: Constant, Stabbing, Pounding Severity: 7 /10 (subjective, self-reported pain score)  Note: Reported level is inconsistent with clinical observations. Clinically the patient looks like a 3/10 A 3/10 is viewed as "Moderate" and described as significantly interfering with activities of daily living (ADL). It becomes difficult to feed, bathe, get dressed, get on and off the toilet or to perform personal hygiene functions. Difficult to get in and out of bed or a chair without assistance. Very distracting. With effort, it can be ignored when deeply involved in activities. Information on the proper use of the pain scale provided to the patient today. When using our objective Pain Scale, levels between  6 and 10/10 are said to belong in an emergency room, as it progressively worsens from a 6/10, described as severely limiting, requiring emergency care not usually available at an outpatient pain management facility. At a 6/10 level, communication becomes difficult and requires great effort. Assistance to reach the emergency department may be required. Facial flushing and profuse sweating along with potentially dangerous increases in heart rate and blood pressure will be evident. Effect on ADL: limits daily activities Timing: Constant Modifying factors: meds BP: 132/70  HR: 78  [name] was last scheduled for an appointment on 11/21/2017 for medication management. During today's appointment we reviewed [name]'s chronic pain status, as well as her outpatient medication regimen.  Patient states that she was obtaining significant pain relief after her bilateral SI joint injections that were performed in May but over the last 2 weeks has been having return of her sacroiliac joint related pain.  Patient is on Eliquis for a history of PE.  Per her hematologist, it is not safe for the patient to stop this medication for an interventional procedure.  Patient will need clearance to stop her Eliquis for 3 days prior to SI joint injection which she will need from her hematologist for history of PE.  Otherwise we will refill the patient's chronic opioid medications as below.  UDS up-to-date.  [place] PMP checked and appropriate.  The patient  reports no history of drug use. Her body mass index is 20.78 kg/m.  Further details on both, my assessment(s), as well as the proposed treatment plan, please see below.  Controlled Substance Pharmacotherapy Assessment REMS (Risk Evaluation and Mitigation Strategy)  Analgesic: Percocet 5 mg twice daily to 3 times daily as  needed, quantity 75/month MME/day: 18.75 mg/day.  [name], RN  05/19/2018 12:54 PM  Sign when Signing Visit Nursing Pain Medication Assessment:    Safety precautions to be maintained throughout the outpatient stay will include: orient to surroundings, keep bed in low position, maintain call bell within reach at all times, provide assistance with transfer out of bed and ambulation.  Medication Inspection Compliance: Pill count conducted under aseptic conditions, in front of the patient. Neither the pills nor the bottle was removed from the patient's sight at any time. Once count was completed pills were immediately returned to the patient in their original bottle.  Medication: Oxycodone/APAP Pill/Patch Count: 0 of 75 pills remain Pill/Patch Appearance: Markings consistent with prescribed medication Bottle Appearance: Standard pharmacy container. Clearly labeled. Filled Date: 97 / 07 / 2019 Last Medication intake:  Today   Pharmacokinetics: Liberation and absorption (onset of action): WNL Distribution (time to peak effect): WNL Metabolism and excretion (duration of action): WNL         Pharmacodynamics: Desired effects: Analgesia: [name] reports >50% benefit. Functional ability: Patient reports that medication allows her to accomplish basic ADLs Clinically meaningful improvement in function (CMIF): Sustained CMIF goals met Perceived effectiveness: Described as relatively effective, allowing for increase in activities of daily living (ADL) Undesirable effects: Side-effects or Adverse reactions: None reported Monitoring: [place] PMP: Online review of the past 52-monthperiod conducted. Compliant with practice rules and regulations Last UDS on record: Summary  Date Value Ref Range Status  02/18/2018 FINAL  Final    Comment:    ==================================================================== TOXASSURE SELECT 13 (MW) ==================================================================== Test                             Result       Flag       Units Drug Present and Declared for Prescription Verification   Oxazepam                        736          EXPECTED   ng/mg creat   Temazepam                      1561         EXPECTED   ng/mg creat    Oxazepam and temazepam are expected metabolites of diazepam.    Oxazepam is also an expected metabolite of other benzodiazepine    drugs, including chlordiazepoxide, prazepam, clorazepate,    halazepam, and temazepam.  Oxazepam and temazepam are available    as scheduled prescription medications.   Oxycodone                      1555         EXPECTED   ng/mg creat   Oxymorphone                    4264         EXPECTED   ng/mg creat   Noroxycodone                   5839         EXPECTED   ng/mg creat   Noroxymorphone                 2000         EXPECTED   ng/mg creat    Sources  of oxycodone are scheduled prescription medications.    Oxymorphone, noroxycodone, and noroxymorphone are expected    metabolites of oxycodone. Oxymorphone is also available as a    scheduled prescription medication. ==================================================================== Test                      Result    Flag   Units      Ref Range   Creatinine              33               mg/dL      >=20 ==================================================================== Declared Medications:  The flagging and interpretation on this report are based on the  following declared medications.  Unexpected results may arise from  inaccuracies in the declared medications.  **Note: The testing scope of this panel includes these medications:  Oxycodone (Oxycodone Acetaminophen)  Temazepam (Restoril)  **Note: The testing scope of this panel does not include following  reported medications:  Acetaminophen  Acetaminophen (Oxycodone Acetaminophen)  Apixaban (Eliquis)  Metoprolol (Toprol)  Ranitidine ==================================================================== For clinical consultation, please call [phone]. ====================================================================    UDS  interpretation: Compliant          Medication Assessment Form: Reviewed. Patient indicates being compliant with therapy Treatment compliance: Compliant Risk Assessment Profile: Aberrant behavior: See prior evaluations. None observed or detected today Comorbid factors increasing risk of overdose: See prior notes. No additional risks detected today Opioid risk tool (ORT) (Total Score): 0 Personal History of Substance Abuse (SUD-Substance use disorder):  Alcohol: Negative  Illegal Drugs: Negative  Rx Drugs: Negative  ORT Risk Level calculation: Low Risk Risk of substance use disorder (SUD): Low Opioid Risk Tool - 05/19/18 1251      Family History of Substance Abuse   Alcohol  Negative    Illegal Drugs  Negative    Rx Drugs  Negative      Personal History of Substance Abuse   Alcohol  Negative    Illegal Drugs  Negative    Rx Drugs  Negative      History of Preadolescent Sexual Abuse   History of Preadolescent Sexual Abuse  Negative or Female      Psychological Disease   Psychological Disease  Negative    Depression  Negative      Total Score   Opioid Risk Tool Scoring  0    Opioid Risk Interpretation  Low Risk      ORT Scoring interpretation table:  Score <3 = Low Risk for SUD  Score between 4-7 = Moderate Risk for SUD  Score >8 = High Risk for Opioid Abuse   Risk Mitigation Strategies:  Patient Counseling: Covered Patient-Prescriber Agreement (PPA): Present and active  Notification to other healthcare providers: Done  Pharmacologic Plan: No change in therapy, at this time.             Laboratory Chemistry  Inflammation Markers (CRP: Acute Phase) (ESR: Chronic Phase) No results found for: CRP, ESRSEDRATE, LATICACIDVEN                       Rheumatology Markers No results found for: RF, ANA, LABURIC, URICUR, LYMEIGGIGMAB, LYMEABIGMQN, HLAB27                      Renal Function Markers Lab Results  Component Value Date   BUN 7 05/16/2018   CREATININE 0.75  05/16/2018   GFRAA >60 05/16/2018  GFRNONAA >60 05/16/2018                             Hepatic Function Markers Lab Results  Component Value Date   AST 17 03/24/2013   ALT 8 03/24/2013   ALBUMIN 3.6 03/24/2013   ALKPHOS 90 03/24/2013   LIPASE 25 03/24/2013                        Electrolytes Lab Results  Component Value Date   NA 140 05/16/2018   K 3.0 (L) 05/16/2018   CL 108 05/16/2018   CALCIUM 8.9 05/16/2018                        Neuropathy Markers No results found for: VITAMINB12, FOLATE, HGBA1C, HIV                      CNS Tests No results found for: COLORCSF, APPEARCSF, RBCCOUNTCSF, WBCCSF, POLYSCSF, LYMPHSCSF, EOSCSF, PROTEINCSF, GLUCCSF, JCVIRUS, CSFOLI, IGGCSF                      Bone Pathology Markers No results found for: VD25OH, CN470JG2EZM, OQ9476LY6, TK3546FK8, 25OHVITD1, 25OHVITD2, 25OHVITD3, TESTOFREE, TESTOSTERONE                       Coagulation Parameters Lab Results  Component Value Date   PLT 469 (H) 05/16/2018   DDIMER <0.27 05/16/2018                        Cardiovascular Markers Lab Results  Component Value Date   HGB 10.7 (L) 05/16/2018   HCT 35.2 (L) 05/16/2018                         CA Markers No results found for: CEA, CA125, LABCA2                      Note: Lab results reviewed.  Recent Diagnostic Imaging Results  DG Chest 2 View CLINICAL DATA:  Chest pain.  EXAM: CHEST - 2 VIEW  COMPARISON:  None.  FINDINGS: The cardiomediastinal contours are normal. The lungs are clear. Pulmonary vasculature is normal. No consolidation, pleural effusion, or pneumothorax. No acute osseous abnormalities are seen.  IMPRESSION: No acute chest findings.  Electronically Signed   By: Keith Rake M.D.   On: 05/16/2018 01:04  Complexity Note: Imaging results reviewed. Results shared with Ms. Sharen Counter, using Layman's terms.                         Meds   Current Outpatient Medications:  .  acetaminophen (TYLENOL) 500 MG  tablet, Take 1,000 mg by mouth every 8 (eight) hours as needed for mild pain, moderate pain, fever or headache. , Disp: , Rfl:  .  ELIQUIS 5 MG TABS tablet, 2 (two) times daily. , Disp: , Rfl: 5 .  famotidine (PEPCID) 20 MG tablet, Take 20 mg by mouth 2 (two) times daily., Disp: , Rfl:  .  metoprolol succinate (TOPROL-XL) 100 MG 24 hr tablet, Take 100 mg by mouth daily. Take with or immediately following a meal., Disp: , Rfl:  .  oxyCODONE-acetaminophen (PERCOCET) 5-325 MG tablet, Take 1-2 tablets by mouth 2 (two) times daily as needed for severe  pain., Disp: 75 tablet, Rfl: 0 .  temazepam (RESTORIL) 30 MG capsule, Take 30 mg by mouth at bedtime as needed for sleep. , Disp: , Rfl:  .  [START ON 06/18/2018] oxyCODONE-acetaminophen (PERCOCET) 5-325 MG tablet, Take 1-2 tablets by mouth 2 (two) times daily as needed for severe pain., Disp: 75 tablet, Rfl: 0 .  RaNITidine HCl (ZANTAC PO), Take 1 tablet by mouth 3 (three) times daily. , Disp: , Rfl:   ROS  Constitutional: Denies any fever or chills Gastrointestinal: No reported hemesis, hematochezia, vomiting, or acute GI distress Musculoskeletal: Denies any acute onset joint swelling, redness, loss of ROM, or weakness Neurological: No reported episodes of acute onset apraxia, aphasia, dysarthria, agnosia, amnesia, paralysis, loss of coordination, or loss of consciousness  Allergies  Ms. Bellows is allergic to buprenorphine; cefaclor; gabapentin; ibuprofen; levofloxacin; macrobid [nitrofurantoin monohyd macro]; naproxen; and penicillins.  [place]  Drug: Ms. Hosking  reports no history of drug use. Alcohol:  reports no history of alcohol use. Tobacco:  reports that she has been smoking cigarettes. She has a 20.00 pack-year smoking history. She has never used smokeless tobacco. Medical:  has a past medical history of Chronic back pain, Hypertension, Migraine, and Plumbism. Surgical: Ms. Fetterly  has a past surgical history that includes Abdominal hysterectomy;  Cholecystectomy; and Hand surgery. Family: family history includes Cancer in an other family member; Diabetes in an other family member; Seizures in an other family member; Stroke in an other family member.  Constitutional Exam  General appearance: Well nourished, well developed, and well hydrated. In no apparent acute distress Vitals:   05/19/18 1244  BP: 132/70  Pulse: 78  Resp: 18  Temp: 98.4 F (36.9 C)  TempSrc: Oral  SpO2: 100%  Weight: 110 lb (49.9 kg)  Height: _0  (1.549 m)   BMI Assessment: Estimated body mass index is 20.78 kg/m as calculated from the following:   Height as of this encounter: _1  (1.549 m).   Weight as of this encounter: 110 lb (49.9 kg).  BMI interpretation table: BMI level Category Range association with higher incidence of chronic pain  <18 kg/m2 Underweight   18.5-24.9 kg/m2 Ideal body weight   25-29.9 kg/m2 Overweight Increased incidence by 20%  30-34.9 kg/m2 Obese (Class I) Increased incidence by 68%  35-39.9 kg/m2 Severe obesity (Class II) Increased incidence by 136%  >40 kg/m2 Extreme obesity (Class III) Increased incidence by 254%   Patient's current BMI Ideal Body weight  Body mass index is 20.78 kg/m. Ideal body weight: 47.8 kg (105 lb 6.1 oz) Adjusted ideal body weight: 48.6 kg (107 lb 3.7 oz)   BMI Readings from Last 4 Encounters:  05/19/18 20.78 kg/m  05/15/18 20.78 kg/m  02/18/18 21.73 kg/m  11/21/17 21.73 kg/m   Wt Readings from Last 4 Encounters:  05/19/18 110 lb (49.9 kg)  05/15/18 110 lb (49.9 kg)  02/18/18 115 lb (52.2 kg)  11/21/17 115 lb (52.2 kg)  Psych/Mental status: Alert, oriented x 3 (person, place, & time)       Eyes: PERLA Respiratory: No evidence of acute respiratory distress  Cervical Spine Area Exam  Skin & Axial Inspection: No masses, redness, edema, swelling, or associated skin lesions Alignment: Symmetrical Functional ROM: Unrestricted ROM      Stability: No instability detected Muscle  Tone/Strength: Functionally intact. No obvious neuro-muscular anomalies detected. Sensory (Neurological): Unimpaired Palpation: No palpable anomalies              Upper Extremity (UE) Exam  Side: Right upper extremity  Side: Left upper extremity  Skin & Extremity Inspection: Skin color, temperature, and hair growth are WNL. No peripheral edema or cyanosis. No masses, redness, swelling, asymmetry, or associated skin lesions. No contractures.  Skin & Extremity Inspection: Skin color, temperature, and hair growth are WNL. No peripheral edema or cyanosis. No masses, redness, swelling, asymmetry, or associated skin lesions. No contractures.  Functional ROM: Unrestricted ROM          Functional ROM: Unrestricted ROM          Muscle Tone/Strength: Functionally intact. No obvious neuro-muscular anomalies detected.  Muscle Tone/Strength: Functionally intact. No obvious neuro-muscular anomalies detected.  Sensory (Neurological): Unimpaired          Sensory (Neurological): Unimpaired          Palpation: No palpable anomalies              Palpation: No palpable anomalies              Provocative Test(s):  Phalen's test: deferred Tinel's test: deferred Apley's scratch test (touch opposite shoulder):  Action 1 (Across chest): deferred Action 2 (Overhead): deferred Action 3 (LB reach): deferred   Provocative Test(s):  Phalen's test: deferred Tinel's test: deferred Apley's scratch test (touch opposite shoulder):  Action 1 (Across chest): deferred Action 2 (Overhead): deferred Action 3 (LB reach): deferred    Thoracic Spine Area Exam  Skin & Axial Inspection: No masses, redness, or swelling Alignment: Symmetrical Functional ROM: Unrestricted ROM Stability: No instability detected Muscle Tone/Strength: Functionally intact. No obvious neuro-muscular anomalies detected. Sensory (Neurological): Unimpaired Muscle strength & Tone: No palpable anomalies  Lumbar Spine Area Exam  Skin & Axial  Inspection: No masses, redness, or swelling Alignment: Symmetrical Functional ROM: Unrestricted ROM       Stability: No instability detected Muscle Tone/Strength: Functionally intact. No obvious neuro-muscular anomalies detected. Sensory (Neurological): Unimpaired Palpation: No palpable anomalies       Provocative Tests: Hyperextension/rotation test: deferred today       Lumbar quadrant test (Kemp's test): deferred today       Lateral bending test: deferred today       Patrick's Maneuver: (+) for bilateral S-I arthralgia             FABER* test: (+) for bilateral S-I arthralgia             S-I anterior distraction/compression test: deferred today         S-I lateral compression test: deferred today         S-I Thigh-thrust test: deferred today         S-I Gaenslen's test: deferred today         *(Flexion, ABduction and External Rotation)  Gait & Posture Assessment  Ambulation: Unassisted Gait: Relatively normal for age and body habitus Posture: WNL   Lower Extremity Exam    Side: Right lower extremity  Side: Left lower extremity  Stability: No instability observed          Stability: No instability observed          Skin & Extremity Inspection: Skin color, temperature, and hair growth are WNL. No peripheral edema or cyanosis. No masses, redness, swelling, asymmetry, or associated skin lesions. No contractures.  Skin & Extremity Inspection: Skin color, temperature, and hair growth are WNL. No peripheral edema or cyanosis. No masses, redness, swelling, asymmetry, or associated skin lesions. No contractures.  Functional ROM: Unrestricted ROM  Functional ROM: Unrestricted ROM                  Muscle Tone/Strength: Functionally intact. No obvious neuro-muscular anomalies detected.  Muscle Tone/Strength: Functionally intact. No obvious neuro-muscular anomalies detected.  Sensory (Neurological): Unimpaired        Sensory (Neurological): Unimpaired        DTR: Patellar:  deferred today Achilles: deferred today Plantar: deferred today  DTR: Patellar: deferred today Achilles: deferred today Plantar: deferred today  Palpation: No palpable anomalies  Palpation: No palpable anomalies   Assessment  Primary Diagnosis & Pertinent Problem List: The primary encounter diagnosis was SI joint arthritis. Diagnoses of Chronic pain syndrome, Lumbar degenerative disc disease, Long term prescription opiate use, Chronic bilateral low back pain with bilateral sciatica, and Lumbar spondylosis were also pertinent to this visit.  Status Diagnosis  Having a Flare-up Persistent Persistent 1. SI joint arthritis   2. Chronic pain syndrome   3. Lumbar degenerative disc disease   4. Long term prescription opiate use   5. Chronic bilateral low back pain with bilateral sciatica   6. Lumbar spondylosis     Problems updated and reviewed during this visit: Problem  Si Joint Arthritis  Long Term Prescription Opiate Use   SI joint arthritis: Previous bilateral SI joint injections in May 2019 which were very helpful for the patient's SI joint related pain.  Can repeat if patient able to safely stop Eliquis for 3 days prior to scheduled procedure.  Patient will need clearance from her hematologist given her history of PE.  We will also refill the patient's Percocet as below.  Patient encouraged to continue acetaminophen 1000 3 times daily as needed moderate pain.  Plan of Care  Pharmacotherapy (Medications Ordered): Meds ordered this encounter  Medications  . oxyCODONE-acetaminophen (PERCOCET) 5-325 MG tablet    Sig: Take 1-2 tablets by mouth 2 (two) times daily as needed for severe pain.    Dispense:  75 tablet    Refill:  0    Do not place this medication, or any other prescription from our practice, on "Automatic Refill". Patient may have prescription filled one day early if pharmacy is closed on scheduled refill date.  Marland Kitchen oxyCODONE-acetaminophen (PERCOCET) 5-325 MG tablet     Sig: Take 1-2 tablets by mouth 2 (two) times daily as needed for severe pain.    Dispense:  75 tablet    Refill:  0    Do not place this medication, or any other prescription from our practice, on "Automatic Refill". Patient may have prescription filled one day early if pharmacy is closed on scheduled refill date.   Lab-work, procedure(s), and/or referral(s): Orders Placed This Encounter  Procedures  . SACROILIAC JOINT INJECTION    Pharmacological management options:  Opioid Analgesics: Refill as above Membrane stabilizer: Tried and failed Muscle relaxant: Tried and failed NSAID: Tried and failed Other analgesic(s): To be determined at a later time   Interventional management options:  Considering:   PRN bilateral SI joint injection pending stopping Eliquis 3 days, needs clearance from hematologist    Provider-requested follow-up: Return in about 8 weeks (around 07/14/2018) for MM with Crystal.  Future Appointments  Date Time Provider [place]  07/17/2018  8:00 AM Vevelyn Francois, NP [facility] None    Primary Care Physician: [facility] Location: [facility] Note by: Gillis Santa, M.D Date: 05/19/2018; Time: 2:00 PM  Patient Instructions   Oxycodone with acetaminophen to last  until 07/18/2018 has been escribed to your pharmacy.  Moderate Conscious Sedation, Adult Sedation is the use of medicines to promote relaxation and relieve discomfort and anxiety. Moderate conscious sedation is a type of sedation. Under moderate conscious sedation, you are less alert than normal, but you are still able to respond to instructions, touch, or both. Moderate conscious sedation is used during short medical and dental procedures. It is milder than deep sedation, which is a type of sedation under which you cannot be easily woken up. It is also milder than general anesthesia, which is the use of medicines to make you unconscious.  Moderate conscious sedation allows you to return to your regular activities sooner. Tell a health care provider about:  Any allergies you have.  All medicines you are taking, including vitamins, herbs, eye drops, creams, and over-the-counter medicines.  Use of steroids (by mouth or creams).  Any problems you or family members have had with sedatives and anesthetic medicines.  Any blood disorders you have.  Any surgeries you have had.  Any medical conditions you have, such as sleep apnea.  Whether you are pregnant or may be pregnant.  Any use of cigarettes, alcohol, marijuana, or street drugs. What are the risks? Generally, this is a safe procedure. However, problems may occur, including:  Getting too much medicine (oversedation).  Nausea.  Allergic reaction to medicines.  Trouble breathing. If this happens, a breathing tube may be used to help with breathing. It will be removed when you are awake and breathing on your own.  Heart trouble.  Lung trouble. What happens before the procedure? Staying hydrated Follow instructions from your health care provider about hydration, which may include:  Up to 2 hours before the procedure - you may continue to drink clear liquids, such as water, clear fruit juice, black coffee, and plain tea. Eating and drinking restrictions Follow instructions from your health care provider about eating and drinking, which may include:  8 hours before the procedure - stop eating heavy meals or foods such as meat, fried foods, or fatty foods.  6 hours before the procedure - stop eating light meals or foods, such as toast or cereal.  6 hours before the procedure - stop drinking milk or drinks that contain milk.  2 hours before the procedure - stop drinking clear liquids. Medicine Ask your health care provider about:  Changing or stopping your regular medicines. This is especially important if you are taking diabetes medicines or blood  thinners.  Taking medicines such as aspirin and ibuprofen. These medicines can thin your blood. Do not take these medicines before your procedure if your health care provider instructs you not to.  Tests and exams  You will have a physical exam.  You may have blood tests done to show: ? How well your kidneys and liver are working. ? How well your blood can clot. General instructions  Plan to have someone take you home from the hospital or clinic.  If you will be going home right after the procedure, plan to have someone with you for 24 hours. What happens during the procedure?  An IV tube will be inserted into one of your veins.  Medicine to help you relax (sedative) will be given through the IV tube.  The medical or dental procedure will be performed. What happens after the procedure?  Your blood pressure, heart rate, breathing rate, and blood oxygen level will be monitored often until the medicines you were given have worn off.  Do not drive for 24 hours. This information is not intended to replace advice given to you by your health care provider. Make sure you discuss any questions you have with your health care provider. Document Released: 01/23/2001 Document Revised: 10/04/2015 Document Reviewed: 08/20/2015 Elsevier Interactive Patient Education  2019 [place]  What are the risk, side effects and possible complications? Generally speaking, most procedures are safe.  However, with any procedure there are risks, side effects, and the possibility of complications.  The risks and complications are dependent upon the sites that are lesioned, or the type of nerve block to be performed.  The closer the procedure is to the spine, the more serious the risks are.  Great care is taken when placing the radio frequency needles, block needles or lesioning probes, but sometimes complications can occur. 1. Infection: Any time there is an injection  through the skin, there is a risk of infection.  This is why sterile conditions are used for these blocks.  There are four possible types of infection. 1. Localized skin infection. 2. Central Nervous System Infection-This can be in the form of Meningitis, which can be deadly. 3. Epidural Infections-This can be in the form of an epidural abscess, which can cause pressure inside of the spine, causing compression of the spinal cord with subsequent paralysis. This would require an emergency surgery to decompress, and there are no guarantees that the patient would recover from the paralysis. 4. Discitis-This is an infection of the intervertebral discs.  It occurs in about 1% of discography procedures.  It is difficult to treat and it may lead to surgery.        2. Pain: the needles have to go through skin and soft tissues, will cause soreness.       3. Damage to internal structures:  The nerves to be lesioned may be near blood vessels or    other nerves which can be potentially damaged.       4. Bleeding: Bleeding is more common if the patient is taking blood thinners such as  aspirin, Coumadin, Ticiid, Plavix, etc., or if he/she have some genetic predisposition  such as hemophilia. Bleeding into the spinal canal can cause compression of the spinal  cord with subsequent paralysis.  This would require an emergency surgery to  decompress and there are no guarantees that the patient would recover from the  paralysis.       5. Pneumothorax:  Puncturing of a lung is a possibility, every time a needle is introduced in  the area of the chest or upper back.  Pneumothorax refers to free air around the  collapsed lung(s), inside of the thoracic cavity (chest cavity).  Another two possible  complications related to a similar event would include: Hemothorax and Chylothorax.   These are variations of the Pneumothorax, where instead of air around the collapsed  lung(s), you may have blood or chyle, respectively.        6. Spinal headaches: They may occur with any procedures in the area of the spine.       7. Persistent CSF (Cerebro-Spinal Fluid) leakage: This is a rare problem, but may occur  with prolonged intrathecal or epidural catheters either due to the formation of a fistulous  track or a dural tear.       8. Nerve damage: By working so close to the spinal cord, there is always a possibility of  nerve damage, which could be as serious  as a permanent spinal cord injury with  paralysis.       9. Death:  Although rare, severe deadly allergic reactions known as "Anaphylactic  reaction" can occur to any of the medications used.      10. Worsening of the symptoms:  We can always make thing worse.  What are the chances of something like this happening? Chances of any of this occuring are extremely low.  By statistics, you have more of a chance of getting killed in a motor vehicle accident: while driving to the hospital than any of the above occurring .  Nevertheless, you should be aware that they are possibilities.  In general, it is similar to taking a shower.  Everybody knows that you can slip, hit your head and get killed.  Does that mean that you should not shower again?  Nevertheless always keep in mind that statistics do not mean anything if you happen to be on the wrong side of them.  Even if a procedure has a 1 (one) in a 1,000,000 (million) chance of going wrong, it you happen to be that one..Also, keep in mind that by statistics, you have more of a chance of having something go wrong when taking medications.  Who should not have this procedure? If you are on a blood thinning medication (e.g. Coumadin, Plavix, see list of "Blood Thinners"), or if you have an active infection going on, you should not have the procedure.  If you are taking any blood thinners, please inform your physician.  How should I prepare for this procedure?  Do not eat or drink anything at least six hours prior to the procedure.  Bring  a driver with you .  It cannot be a taxi.  Come accompanied by an adult that can drive you back, and that is strong enough to help you if your legs get weak or numb from the local anesthetic.  Take all of your medicines the morning of the procedure with just enough water to swallow them.  If you have diabetes, make sure that you are scheduled to have your procedure done first thing in the morning, whenever possible.  If you have diabetes, take only half of your insulin dose and notify our nurse that you have done so as soon as you arrive at the clinic.  If you are diabetic, but only take blood sugar pills (oral hypoglycemic), then do not take them on the morning of your procedure.  You may take them after you have had the procedure.  Do not take aspirin or any aspirin-containing medications, at least eleven (11) days prior to the procedure.  They may prolong bleeding.  Wear loose fitting clothing that may be easy to take off and that you would not mind if it got stained with Betadine or blood.  Do not wear any jewelry or perfume  Remove any nail coloring.  It will interfere with some of our monitoring equipment.  NOTE: Remember that this is not meant to be interpreted as a complete list of all possible complications.  Unforeseen problems may occur.  BLOOD THINNERS The following drugs contain aspirin or other products, which can cause increased bleeding during surgery and should not be taken for 2 weeks prior to and 1 week after surgery.  If you should need take something for relief of minor pain, you may take acetaminophen which is found in Tylenol,m Datril, Anacin-3 and Panadol. It is not blood thinner. The products listed below are.  Do  not take any of the products listed below in addition to any listed on your instruction sheet.  A.P.C or A.P.C with Codeine Codeine Phosphate Capsules #3 Ibuprofen Ridaura  ABC compound Congesprin Imuran rimadil  Advil Cope Indocin Robaxisal   Alka-Seltzer Effervescent Pain Reliever and Antacid Coricidin or Coricidin-D  Indomethacin Rufen  Alka-Seltzer plus Cold Medicine Cosprin Ketoprofen S-A-C Tablets  Anacin Analgesic Tablets or Capsules Coumadin Korlgesic Salflex  Anacin Extra Strength Analgesic tablets or capsules CP-2 Tablets Lanoril Salicylate  Anaprox Cuprimine Capsules Levenox Salocol  Anexsia-D Dalteparin Magan Salsalate  Anodynos Darvon compound Magnesium Salicylate Sine-off  Ansaid Dasin Capsules Magsal Sodium Salicylate  Anturane Depen Capsules Marnal Soma  APF Arthritis pain formula Dewitt's Pills Measurin Stanback  Argesic Dia-Gesic Meclofenamic Sulfinpyrazone  Arthritis Bayer Timed Release Aspirin Diclofenac Meclomen Sulindac  Arthritis pain formula Anacin Dicumarol Medipren Supac  Analgesic (Safety coated) Arthralgen Diffunasal Mefanamic Suprofen  Arthritis Strength Bufferin Dihydrocodeine Mepro Compound Suprol  Arthropan liquid Dopirydamole Methcarbomol with Aspirin Synalgos  ASA tablets/Enseals Disalcid Micrainin Tagament  Ascriptin Doan's Midol Talwin  Ascriptin A/D Dolene Mobidin Tanderil  Ascriptin Extra Strength Dolobid Moblgesic Ticlid  Ascriptin with Codeine Doloprin or Doloprin with Codeine Momentum Tolectin  Asperbuf Duoprin Mono-gesic Trendar  Aspergum Duradyne Motrin or Motrin IB Triminicin  Aspirin plain, buffered or enteric coated Durasal Myochrisine Trigesic  Aspirin Suppositories Easprin Nalfon Trillsate  Aspirin with Codeine Ecotrin Regular or Extra Strength Naprosyn Uracel  Atromid-S Efficin Naproxen Ursinus  Auranofin Capsules Elmiron Neocylate Vanquish  Axotal Emagrin Norgesic Verin  Azathioprine Empirin or Empirin with Codeine Normiflo Vitamin E  Azolid Emprazil Nuprin Voltaren  Bayer Aspirin plain, buffered or children's or timed BC Tablets or powders Encaprin Orgaran Warfarin Sodium  Buff-a-Comp Enoxaparin Orudis Zorpin  Buff-a-Comp with Codeine Equegesic Os-Cal-Gesic   Buffaprin  Excedrin plain, buffered or Extra Strength Oxalid   Bufferin Arthritis Strength Feldene Oxphenbutazone   Bufferin plain or Extra Strength Feldene Capsules Oxycodone with Aspirin   Bufferin with Codeine Fenoprofen Fenoprofen Pabalate or Pabalate-SF   Buffets II Flogesic Panagesic   Buffinol plain or Extra Strength Florinal or Florinal with Codeine Panwarfarin   Buf-Tabs Flurbiprofen Penicillamine   Butalbital Compound Four-way cold tablets Penicillin   Butazolidin Fragmin Pepto-Bismol   Carbenicillin Geminisyn Percodan   Carna Arthritis Reliever Geopen Persantine   Carprofen Gold's salt Persistin   Chloramphenicol Goody's Phenylbutazone   Chloromycetin Haltrain Piroxlcam   Clmetidine heparin Plaquenil   Cllnoril Hyco-pap Ponstel   Clofibrate Hydroxy chloroquine Propoxyphen         Before stopping any of these medications, be sure to consult the physician who ordered them.  Some, such as Coumadin (Warfarin) are ordered to prevent or treat serious conditions such as "deep thrombosis", "pumonary embolisms", and other heart problems.  The amount of time that you may need off of the medication may also vary with the medication and the reason for which you were taking it.  If you are taking any of these medications, please make sure you notify your pain physician before you undergo any procedures.   Sacroiliac (SI) Joint Injection Patient Information  Description: The sacroiliac joint connects the scrum (very low back and tailbone) to the ilium (a pelvic bone which also forms half of the hip joint).  Normally this joint experiences very little motion.  When this joint becomes inflamed or unstable low back and or hip and pelvis pain may result.  Injection of this joint with local anesthetics (numbing medicines) and steroids can provide diagnostic information and  reduce pain.  This injection is performed with the aid of x-ray guidance into the tailbone area while you are lying on your stomach.   You  may experience an electrical sensation down the leg while this is being done.  You may also experience numbness.  We also may ask if we are reproducing your normal pain during the injection.  Conditions which may be treated SI injection:   Low back, buttock, hip or leg pain  Preparation for the Injection:  1. Do not eat any solid food or dairy products within 8 hours of your appointment.  2. You may drink clear liquids up to 3 hours before appointment.  Clear liquids include water, black coffee, juice or soda.  No milk or cream please. 3. You may take your regular medications, including pain medications with a sip of water before your appointment.  Diabetics should hold regular insulin (if take separately) and take 1/2 normal NPH dose the morning of the procedure.  Carry some sugar containing items with you to your appointment. 4. A driver must accompany you and be prepared to drive you home after your procedure. 5. Bring all of your current medications with you. 6. An IV may be inserted and sedation may be given at the discretion of the physician. 7. A blood pressure cuff, EKG and other monitors will often be applied during the procedure.  Some patients may need to have extra oxygen administered for a short period.  8. You will be asked to provide medical information, including your allergies, prior to the procedure.  We must know immediately if you are taking blood thinners (like Coumadin/Warfarin) or if you are allergic to IV iodine contrast (dye).  We must know if you could possible be pregnant.  Possible side effects:   Bleeding from needle site  Infection (rare, may require surgery)  Nerve injury (rare)  Numbness & tingling (temporary)  A brief convulsion or seizure  Light-headedness (temporary)  Pain at injection site (several days)  Decreased blood pressure (temporary)  Weakness in the leg (temporary)   Call if you experience:   New onset weakness or numbness of an  extremity below the injection site that last more than 8 hours.  Hives or difficulty breathing ( go to the emergency room)  Inflammation or drainage at the injection site  Any new symptoms which are concerning to you  Please note:  Although the local anesthetic injected can often make your back/ hip/ buttock/ leg feel good for several hours after the injections, the pain will likely return.  It takes 3-7 days for steroids to work in the sacroiliac area.  You may not notice any pain relief for at least that one week.  If effective, we will often do a series of three injections spaced 3-6 weeks apart to maximally decrease your pain.  After the initial series, we generally will wait some months before a repeat injection of the same type.  If you have any questions, please call [phone] [facility]

## 2018-05-19 NOTE — Patient Instructions (Signed)
Oxycodone with acetaminophen to last until 07/18/2018 has been escribed to your pharmacy.  Moderate Conscious Sedation, Adult Sedation is the use of medicines to promote relaxation and relieve discomfort and anxiety. Moderate conscious sedation is a type of sedation. Under moderate conscious sedation, you are less alert than normal, but you are still able to respond to instructions, touch, or both. Moderate conscious sedation is used during short medical and dental procedures. It is milder than deep sedation, which is a type of sedation under which you cannot be easily woken up. It is also milder than general anesthesia, which is the use of medicines to make you unconscious. Moderate conscious sedation allows you to return to your regular activities sooner. Tell a health care provider about:  Any allergies you have.  All medicines you are taking, including vitamins, herbs, eye drops, creams, and over-the-counter medicines.  Use of steroids (by mouth or creams).  Any problems you or family members have had with sedatives and anesthetic medicines.  Any blood disorders you have.  Any surgeries you have had.  Any medical conditions you have, such as sleep apnea.  Whether you are pregnant or may be pregnant.  Any use of cigarettes, alcohol, marijuana, or street drugs. What are the risks? Generally, this is a safe procedure. However, problems may occur, including:  Getting too much medicine (oversedation).  Nausea.  Allergic reaction to medicines.  Trouble breathing. If this happens, a breathing tube may be used to help with breathing. It will be removed when you are awake and breathing on your own.  Heart trouble.  Lung trouble. What happens before the procedure? Staying hydrated Follow instructions from your health care provider about hydration, which may include:  Up to 2 hours before the procedure - you may continue to drink clear liquids, such as water, clear fruit juice,  black coffee, and plain tea. Eating and drinking restrictions Follow instructions from your health care provider about eating and drinking, which may include:  8 hours before the procedure - stop eating heavy meals or foods such as meat, fried foods, or fatty foods.  6 hours before the procedure - stop eating light meals or foods, such as toast or cereal.  6 hours before the procedure - stop drinking milk or drinks that contain milk.  2 hours before the procedure - stop drinking clear liquids. Medicine Ask your health care provider about:  Changing or stopping your regular medicines. This is especially important if you are taking diabetes medicines or blood thinners.  Taking medicines such as aspirin and ibuprofen. These medicines can thin your blood. Do not take these medicines before your procedure if your health care provider instructs you not to.  Tests and exams  You will have a physical exam.  You may have blood tests done to show: ? How well your kidneys and liver are working. ? How well your blood can clot. General instructions  Plan to have someone take you home from the hospital or clinic.  If you will be going home right after the procedure, plan to have someone with you for 24 hours. What happens during the procedure?  An IV tube will be inserted into one of your veins.  Medicine to help you relax (sedative) will be given through the IV tube.  The medical or dental procedure will be performed. What happens after the procedure?  Your blood pressure, heart rate, breathing rate, and blood oxygen level will be monitored often until the medicines you were given  have worn off.  Do not drive for 24 hours. This information is not intended to replace advice given to you by your health care provider. Make sure you discuss any questions you have with your health care provider. Document Released: 01/23/2001 Document Revised: 10/04/2015 Document Reviewed:  08/20/2015 Elsevier Interactive Patient Education  2019 [place]  What are the risk, side effects and possible complications? Generally speaking, most procedures are safe.  However, with any procedure there are risks, side effects, and the possibility of complications.  The risks and complications are dependent upon the sites that are lesioned, or the type of nerve block to be performed.  The closer the procedure is to the spine, the more serious the risks are.  Great care is taken when placing the radio frequency needles, block needles or lesioning probes, but sometimes complications can occur. 1. Infection: Any time there is an injection through the skin, there is a risk of infection.  This is why sterile conditions are used for these blocks.  There are four possible types of infection. 1. Localized skin infection. 2. Central Nervous System Infection-This can be in the form of Meningitis, which can be deadly. 3. Epidural Infections-This can be in the form of an epidural abscess, which can cause pressure inside of the spine, causing compression of the spinal cord with subsequent paralysis. This would require an emergency surgery to decompress, and there are no guarantees that the patient would recover from the paralysis. 4. Discitis-This is an infection of the intervertebral discs.  It occurs in about 1% of discography procedures.  It is difficult to treat and it may lead to surgery.        2. Pain: the needles have to go through skin and soft tissues, will cause soreness.       3. Damage to internal structures:  The nerves to be lesioned may be near blood vessels or    other nerves which can be potentially damaged.       4. Bleeding: Bleeding is more common if the patient is taking blood thinners such as  aspirin, Coumadin, Ticiid, Plavix, etc., or if he/she have some genetic predisposition  such as hemophilia. Bleeding into the spinal canal can cause  compression of the spinal  cord with subsequent paralysis.  This would require an emergency surgery to  decompress and there are no guarantees that the patient would recover from the  paralysis.       5. Pneumothorax:  Puncturing of a lung is a possibility, every time a needle is introduced in  the area of the chest or upper back.  Pneumothorax refers to free air around the  collapsed lung(s), inside of the thoracic cavity (chest cavity).  Another two possible  complications related to a similar event would include: Hemothorax and Chylothorax.   These are variations of the Pneumothorax, where instead of air around the collapsed  lung(s), you may have blood or chyle, respectively.       6. Spinal headaches: They may occur with any procedures in the area of the spine.       7. Persistent CSF (Cerebro-Spinal Fluid) leakage: This is a rare problem, but may occur  with prolonged intrathecal or epidural catheters either due to the formation of a fistulous  track or a dural tear.       8. Nerve damage: By working so close to the spinal cord, there is always a possibility of  nerve damage, which  could be as serious as a permanent spinal cord injury with  paralysis.       9. Death:  Although rare, severe deadly allergic reactions known as "Anaphylactic  reaction" can occur to any of the medications used.      10. Worsening of the symptoms:  We can always make thing worse.  What are the chances of something like this happening? Chances of any of this occuring are extremely low.  By statistics, you have more of a chance of getting killed in a motor vehicle accident: while driving to the hospital than any of the above occurring .  Nevertheless, you should be aware that they are possibilities.  In general, it is similar to taking a shower.  Everybody knows that you can slip, hit your head and get killed.  Does that mean that you should not shower again?  Nevertheless always keep in mind that statistics do not mean  anything if you happen to be on the wrong side of them.  Even if a procedure has a 1 (one) in a 1,000,000 (million) chance of going wrong, it you happen to be that one..Also, keep in mind that by statistics, you have more of a chance of having something go wrong when taking medications.  Who should not have this procedure? If you are on a blood thinning medication (e.g. Coumadin, Plavix, see list of "Blood Thinners"), or if you have an active infection going on, you should not have the procedure.  If you are taking any blood thinners, please inform your physician.  How should I prepare for this procedure?  Do not eat or drink anything at least six hours prior to the procedure.  Bring a driver with you .  It cannot be a taxi.  Come accompanied by an adult that can drive you back, and that is strong enough to help you if your legs get weak or numb from the local anesthetic.  Take all of your medicines the morning of the procedure with just enough water to swallow them.  If you have diabetes, make sure that you are scheduled to have your procedure done first thing in the morning, whenever possible.  If you have diabetes, take only half of your insulin dose and notify our nurse that you have done so as soon as you arrive at the clinic.  If you are diabetic, but only take blood sugar pills (oral hypoglycemic), then do not take them on the morning of your procedure.  You may take them after you have had the procedure.  Do not take aspirin or any aspirin-containing medications, at least eleven (11) days prior to the procedure.  They may prolong bleeding.  Wear loose fitting clothing that may be easy to take off and that you would not mind if it got stained with Betadine or blood.  Do not wear any jewelry or perfume  Remove any nail coloring.  It will interfere with some of our monitoring equipment.  NOTE: Remember that this is not meant to be interpreted as a complete list of all possible  complications.  Unforeseen problems may occur.  BLOOD THINNERS The following drugs contain aspirin or other products, which can cause increased bleeding during surgery and should not be taken for 2 weeks prior to and 1 week after surgery.  If you should need take something for relief of minor pain, you may take acetaminophen which is found in Tylenol,m Datril, Anacin-3 and Panadol. It is not blood thinner. The products  listed below are.  Do not take any of the products listed below in addition to any listed on your instruction sheet.  A.P.C or A.P.C with Codeine Codeine Phosphate Capsules #3 Ibuprofen Ridaura  ABC compound Congesprin Imuran rimadil  Advil Cope Indocin Robaxisal  Alka-Seltzer Effervescent Pain Reliever and Antacid Coricidin or Coricidin-D  Indomethacin Rufen  Alka-Seltzer plus Cold Medicine Cosprin Ketoprofen S-A-C Tablets  Anacin Analgesic Tablets or Capsules Coumadin Korlgesic Salflex  Anacin Extra Strength Analgesic tablets or capsules CP-2 Tablets Lanoril Salicylate  Anaprox Cuprimine Capsules Levenox Salocol  Anexsia-D Dalteparin Magan Salsalate  Anodynos Darvon compound Magnesium Salicylate Sine-off  Ansaid Dasin Capsules Magsal Sodium Salicylate  Anturane Depen Capsules Marnal Soma  APF Arthritis pain formula Dewitt's Pills Measurin Stanback  Argesic Dia-Gesic Meclofenamic Sulfinpyrazone  Arthritis Bayer Timed Release Aspirin Diclofenac Meclomen Sulindac  Arthritis pain formula Anacin Dicumarol Medipren Supac  Analgesic (Safety coated) Arthralgen Diffunasal Mefanamic Suprofen  Arthritis Strength Bufferin Dihydrocodeine Mepro Compound Suprol  Arthropan liquid Dopirydamole Methcarbomol with Aspirin Synalgos  ASA tablets/Enseals Disalcid Micrainin Tagament  Ascriptin Doan's Midol Talwin  Ascriptin A/D Dolene Mobidin Tanderil  Ascriptin Extra Strength Dolobid Moblgesic Ticlid  Ascriptin with Codeine Doloprin or Doloprin with Codeine Momentum Tolectin  Asperbuf Duoprin  Mono-gesic Trendar  Aspergum Duradyne Motrin or Motrin IB Triminicin  Aspirin plain, buffered or enteric coated Durasal Myochrisine Trigesic  Aspirin Suppositories Easprin Nalfon Trillsate  Aspirin with Codeine Ecotrin Regular or Extra Strength Naprosyn Uracel  Atromid-S Efficin Naproxen Ursinus  Auranofin Capsules Elmiron Neocylate Vanquish  Axotal Emagrin Norgesic Verin  Azathioprine Empirin or Empirin with Codeine Normiflo Vitamin E  Azolid Emprazil Nuprin Voltaren  Bayer Aspirin plain, buffered or children's or timed BC Tablets or powders Encaprin Orgaran Warfarin Sodium  Buff-a-Comp Enoxaparin Orudis Zorpin  Buff-a-Comp with Codeine Equegesic Os-Cal-Gesic   Buffaprin Excedrin plain, buffered or Extra Strength Oxalid   Bufferin Arthritis Strength Feldene Oxphenbutazone   Bufferin plain or Extra Strength Feldene Capsules Oxycodone with Aspirin   Bufferin with Codeine Fenoprofen Fenoprofen Pabalate or Pabalate-SF   Buffets II Flogesic Panagesic   Buffinol plain or Extra Strength Florinal or Florinal with Codeine Panwarfarin   Buf-Tabs Flurbiprofen Penicillamine   Butalbital Compound Four-way cold tablets Penicillin   Butazolidin Fragmin Pepto-Bismol   Carbenicillin Geminisyn Percodan   Carna Arthritis Reliever Geopen Persantine   Carprofen Gold's salt Persistin   Chloramphenicol Goody's Phenylbutazone   Chloromycetin Haltrain Piroxlcam   Clmetidine heparin Plaquenil   Cllnoril Hyco-pap Ponstel   Clofibrate Hydroxy chloroquine Propoxyphen         Before stopping any of these medications, be sure to consult the physician who ordered them.  Some, such as Coumadin (Warfarin) are ordered to prevent or treat serious conditions such as "deep thrombosis", "pumonary embolisms", and other heart problems.  The amount of time that you may need off of the medication may also vary with the medication and the reason for which you were taking it.  If you are taking any of these medications, please  make sure you notify your pain physician before you undergo any procedures.   Sacroiliac (SI) Joint Injection Patient Information  Description: The sacroiliac joint connects the scrum (very low back and tailbone) to the ilium (a pelvic bone which also forms half of the hip joint).  Normally this joint experiences very little motion.  When this joint becomes inflamed or unstable low back and or hip and pelvis pain may result.  Injection of this joint with local anesthetics (numbing medicines) and steroids  can provide diagnostic information and reduce pain.  This injection is performed with the aid of x-ray guidance into the tailbone area while you are lying on your stomach.   You may experience an electrical sensation down the leg while this is being done.  You may also experience numbness.  We also may ask if we are reproducing your normal pain during the injection.  Conditions which may be treated SI injection:   Low back, buttock, hip or leg pain  Preparation for the Injection:  1. Do not eat any solid food or dairy products within 8 hours of your appointment.  2. You may drink clear liquids up to 3 hours before appointment.  Clear liquids include water, black coffee, juice or soda.  No milk or cream please. 3. You may take your regular medications, including pain medications with a sip of water before your appointment.  Diabetics should hold regular insulin (if take separately) and take 1/2 normal NPH dose the morning of the procedure.  Carry some sugar containing items with you to your appointment. 4. A driver must accompany you and be prepared to drive you home after your procedure. 5. Bring all of your current medications with you. 6. An IV may be inserted and sedation may be given at the discretion of the physician. 7. A blood pressure cuff, EKG and other monitors will often be applied during the procedure.  Some patients may need to have extra oxygen administered for a short period.   8. You will be asked to provide medical information, including your allergies, prior to the procedure.  We must know immediately if you are taking blood thinners (like Coumadin/Warfarin) or if you are allergic to IV iodine contrast (dye).  We must know if you could possible be pregnant.  Possible side effects:   Bleeding from needle site  Infection (rare, may require surgery)  Nerve injury (rare)  Numbness & tingling (temporary)  A brief convulsion or seizure  Light-headedness (temporary)  Pain at injection site (several days)  Decreased blood pressure (temporary)  Weakness in the leg (temporary)   Call if you experience:   New onset weakness or numbness of an extremity below the injection site that last more than 8 hours.  Hives or difficulty breathing ( go to the emergency room)  Inflammation or drainage at the injection site  Any new symptoms which are concerning to you  Please note:  Although the local anesthetic injected can often make your back/ hip/ buttock/ leg feel good for several hours after the injections, the pain will likely return.  It takes 3-7 days for steroids to work in the sacroiliac area.  You may not notice any pain relief for at least that one week.  If effective, we will often do a series of three injections spaced 3-6 weeks apart to maximally decrease your pain.  After the initial series, we generally will wait some months before a repeat injection of the same type.  If you have any questions, please call [phone] [facility]

## 2018-05-22 ENCOUNTER — Telehealth: Payer: Self-pay

## 2018-05-28 ENCOUNTER — Ambulatory Visit: Payer: BLUE CROSS/BLUE SHIELD | Admitting: Student in an Organized Health Care Education/Training Program

## 2018-06-02 ENCOUNTER — Ambulatory Visit: Payer: BLUE CROSS/BLUE SHIELD | Admitting: Student in an Organized Health Care Education/Training Program

## 2018-06-16 ENCOUNTER — Ambulatory Visit
Admission: RE | Admit: 2018-06-16 | Discharge: 2018-06-16 | Disposition: A | Discharge status: Home / Self Care | Payer: BLUE CROSS/BLUE SHIELD | Source: Ambulatory Visit | Attending: Student in an Organized Health Care Education/Training Program | Admitting: Student in an Organized Health Care Education/Training Program

## 2018-06-16 ENCOUNTER — Ambulatory Visit (HOSPITAL_BASED_OUTPATIENT_CLINIC_OR_DEPARTMENT_OTHER): Payer: BLUE CROSS/BLUE SHIELD | Admitting: Student in an Organized Health Care Education/Training Program

## 2018-06-16 ENCOUNTER — Encounter: Payer: Self-pay | Admitting: Student in an Organized Health Care Education/Training Program

## 2018-06-16 DIAGNOSIS — M47818 Spondylosis without myelopathy or radiculopathy, sacral and sacrococcygeal region: Secondary | ICD-10-CM | POA: Diagnosis present

## 2018-06-16 DIAGNOSIS — G894 Chronic pain syndrome: Secondary | ICD-10-CM | POA: Insufficient documentation

## 2018-06-16 MED ORDER — ROPIVACAINE HCL 2 MG/ML IJ SOLN
10.0000 mL | Freq: Once | INTRAMUSCULAR | Status: AC
  Administered 2018-06-16: 10 mL
  Filled 2018-06-16: qty 10

## 2018-06-16 MED ORDER — IOPAMIDOL (ISOVUE-M 200) INJECTION 41%
10.0000 mL | Freq: Once | INTRAMUSCULAR | Status: AC
  Administered 2018-06-16: 10 mL via EPIDURAL
  Filled 2018-06-16: qty 10

## 2018-06-16 MED ORDER — LIDOCAINE HCL 2 % IJ SOLN
20.0000 mL | Freq: Once | INTRAMUSCULAR | Status: AC
  Administered 2018-06-16: 400 mg
  Filled 2018-06-16: qty 40

## 2018-06-16 MED ORDER — FENTANYL CITRATE (PF) 100 MCG/2ML IJ SOLN
25.0000 ug | INTRAMUSCULAR | Status: DC | PRN
  Administered 2018-06-16: 100 ug via INTRAVENOUS
  Filled 2018-06-16: qty 2

## 2018-06-16 MED ORDER — LACTATED RINGERS IV SOLN
1000.0000 mL | Freq: Once | INTRAVENOUS | Status: AC
  Administered 2018-06-16: 1000 mL via INTRAVENOUS

## 2018-06-16 MED ORDER — DEXAMETHASONE SODIUM PHOSPHATE 10 MG/ML IJ SOLN
10.0000 mg | Freq: Once | INTRAMUSCULAR | Status: AC
  Administered 2018-06-16: 10 mg
  Filled 2018-06-16: qty 1

## 2018-06-16 NOTE — Progress Notes (Signed)
Patient's Name: [name]  MRN: [id number]  Referring Provider: Gillis Santa, MD  DOB: 06-19-1970  PCP: [facility]  DOS: 06/16/2018  Note by: Gillis Santa, MD  Service setting: Ambulatory outpatient  Specialty: Interventional Pain Management  Patient type: Established  Location: [facility] (AMB) Pain Management Facility  Visit type: Interventional Procedure   Primary Reason for Visit: Interventional Pain Management Treatment. CC: Back Pain (lower bilateral )  Procedure:  Anesthesia, Analgesia, Anxiolysis:  Type: Diagnostic Sacroiliac Joint Steroid Injection #2  Region: Inferior Lumbosacral Region Level: PIIS (Posterior Inferior Iliac Spine) Laterality: Bilateral  Type: Moderate (Conscious) Sedation combined with Local Anesthesia Indication(s): Analgesia and Anxiety Route: Intravenous (IV) IV Access: Secured Sedation: Meaningful verbal contact was maintained at all times during the procedure  Local Anesthetic: Lidocaine 1%   Indications: 1. SI joint arthritis    Pain Score: Pre-procedure: 7 /10 Post-procedure: 4 /10  Pre-op Assessment:  [name] is a 48 y.o. (year old), female patient, seen today for interventional treatment. She  has a past surgical history that includes Abdominal hysterectomy; Cholecystectomy; and Hand surgery. [name] has a current medication list which includes the following prescription(s): acetaminophen, eliquis, famotidine, metoprolol succinate, oxycodone-acetaminophen, oxycodone-acetaminophen, temazepam, and ranitidine hcl, and the following Facility-Administered Medications: fentanyl. Her primarily concern today is the Back Pain (lower bilateral )  Initial Vital Signs:  Pulse/HCG Rate: 68ECG Heart Rate: 74 Temp: 98.4 F (36.9 C) Resp: 16 BP: 138/66 SpO2: 100 %  BMI: Estimated body mass index is 20.78 kg/m as calculated from the following:   Height as of this encounter: 5\' 1"  (1.549 m).   Weight as of this encounter: 110 lb  (49.9 kg).  Risk Assessment: Allergies: Reviewed. She is allergic to buprenorphine; cefaclor; gabapentin; ibuprofen; levofloxacin; macrobid [nitrofurantoin monohyd macro]; naproxen; and penicillins.  Allergy Precautions: None required Coagulopathies: Reviewed. None identified.  Blood-thinner therapy: None at this time Active Infection(s): Reviewed. None identified. [name] is afebrile  Site Confirmation: [name] was asked to confirm the procedure and laterality before marking the site Procedure checklist: Completed Consent: Before the procedure and under the influence of no sedative(s), amnesic(s), or anxiolytics, the patient was informed of the treatment options, risks and possible complications. To fulfill our ethical and legal obligations, as recommended by the American Medical Association's Code of Ethics, I have informed the patient of my clinical impression; the nature and purpose of the treatment or procedure; the risks, benefits, and possible complications of the intervention; the alternatives, including doing nothing; the risk(s) and benefit(s) of the alternative treatment(s) or procedure(s); and the risk(s) and benefit(s) of doing nothing. The patient was provided information about the general risks and possible complications associated with the procedure. These may include, but are not limited to: failure to achieve desired goals, infection, bleeding, organ or nerve damage, allergic reactions, paralysis, and death. In addition, the patient was informed of those risks and complications associated to the procedure, such as failure to decrease pain; infection; bleeding; organ or nerve damage with subsequent damage to sensory, motor, and/or autonomic systems, resulting in permanent pain, numbness, and/or weakness of one or several areas of the body; allergic reactions; (i.e.: anaphylactic reaction); and/or death. Furthermore, the patient was informed of those risks and complications  associated with the medications. These include, but are not limited to: allergic reactions (i.e.: anaphylactic or anaphylactoid reaction(s)); adrenal axis suppression; blood sugar elevation that in diabetics may result in ketoacidosis or comma; water retention that in patients with history of  congestive heart failure may result in shortness of breath, pulmonary edema, and decompensation with resultant heart failure; weight gain; swelling or edema; medication-induced neural toxicity; particulate matter embolism and blood vessel occlusion with resultant organ, and/or nervous system infarction; and/or aseptic necrosis of one or more joints. Finally, the patient was informed that Medicine is not an exact science; therefore, there is also the possibility of unforeseen or unpredictable risks and/or possible complications that may result in a catastrophic outcome. The patient indicated having understood very clearly. We have given the patient no guarantees and we have made no promises. Enough time was given to the patient to ask questions, all of which were answered to the patient's satisfaction. [name] has indicated that she wanted to continue with the procedure. Attestation: I, the ordering provider, attest that I have discussed with the patient the benefits, risks, side-effects, alternatives, likelihood of achieving goals, and potential problems during recovery for the procedure that I have provided informed consent. Date  Time: 06/16/2018  8:32 AM  Pre-Procedure Preparation:  Monitoring: As per clinic protocol. Respiration, ETCO2, SpO2, BP, heart rate and rhythm monitor placed and checked for adequate function Safety Precautions: Patient was assessed for positional comfort and pressure points before starting the procedure. Time-out: I initiated and conducted the "Time-out" before starting the procedure, as per protocol. The patient was asked to participate by confirming the accuracy of the "Time Out"  information. Verification of the correct person, site, and procedure were performed and confirmed by me, the nursing staff, and the patient. "Time-out" conducted as per Joint Commission's Universal Protocol (UP.01.01.01). Time: 1014  Description of Procedure:       Position: Prone Target Area: Inferior, posterior, aspect of the sacroiliac fissure Approach: Posterior, paraspinal, ipsilateral approach. Area Prepped: Entire Lower Lumbosacral Region Prepping solution: ChloraPrep (2% chlorhexidine gluconate and 70% isopropyl alcohol) Safety Precautions: Aspiration looking for blood return was conducted prior to all injections. At no point did we inject any substances, as a needle was being advanced. No attempts were made at seeking any paresthesias. Safe injection practices and needle disposal techniques used. Medications properly checked for expiration dates. SDV (single dose vial) medications used. Description of the Procedure: Protocol guidelines were followed. The patient was placed in position over the procedure table. The target area was identified and the area prepped in the usual manner. Skin & deeper tissues infiltrated with local anesthetic. Appropriate amount of time allowed to pass for local anesthetics to take effect. The procedure needle was advanced under fluoroscopic guidance into the sacroiliac joint until a firm endpoint was obtained. Proper needle placement secured. Negative aspiration confirmed. Solution injected in intermittent fashion, asking for systemic symptoms every 0.5cc of injectate. The needles were then removed and the area cleansed, making sure to leave some of the prepping solution back to take advantage of its long term bactericidal properties. Vitals:   06/16/18 1025 06/16/18 1033 06/16/18 1044 06/16/18 1053  BP: (!) 174/92 136/77 137/84 (!) 149/79  Pulse: 78     Resp: 16 16 16 15   Temp:  98.5 F (36.9 C)  98.6 F (37 C)  TempSrc:      SpO2: 100% 100% 100% 100%   Weight:      Height:        Start Time: 1014 hrs. End Time:   hrs. Materials:  Needle(s) Type: Regular needle Gauge: 22G Length: 3.5-in Medication(s): Please see orders for medications and dosing details. 10 cc solution made of 9 cc of 0.2% ropivacaine, 1 cc  of Decadron 10 mg/cc.  2.5 cc injected intra-articular, 2.5 cc injected periarticular on each side. Imaging Guidance (Non-Spinal):  Type of Imaging Technique: Fluoroscopy Guidance (Non-Spinal) Indication(s): Assistance in needle guidance and placement for procedures requiring needle placement in or near specific anatomical locations not easily accessible without such assistance. Exposure Time: Please see nurses notes. Contrast: Before injecting any contrast, we confirmed that the patient did not have an allergy to iodine, shellfish, or radiological contrast. Once satisfactory needle placement was completed at the desired level, radiological contrast was injected. Contrast injected under live fluoroscopy. No contrast complications. See chart for type and volume of contrast used. Fluoroscopic Guidance: I was personally present during the use of fluoroscopy. "Tunnel Vision Technique" used to obtain the best possible view of the target area. Parallax error corrected before commencing the procedure. "Direction-depth-direction" technique used to introduce the needle under continuous pulsed fluoroscopy. Once target was reached, antero-posterior, oblique, and lateral fluoroscopic projection used confirm needle placement in all planes. Images permanently stored in EMR. Interpretation: I personally interpreted the imaging intraoperatively. Adequate needle placement confirmed in multiple planes. Appropriate spread of contrast into desired area was observed. No evidence of afferent or efferent intravascular uptake. Permanent images saved into the patient's record.  Antibiotic Prophylaxis:   Anti-infectives (From admission, onward)   None      Indication(s): None identified  Post-operative Assessment:  Post-procedure Vital Signs:  Pulse/HCG Rate: 7871 Temp: 98.6 F (37 C) Resp: 15 BP: (!) 149/79 SpO2: 100 %  EBL: None  Complications: No immediate post-treatment complications observed by team, or reported by patient.  Note: The patient tolerated the entire procedure well. A repeat set of vitals were taken after the procedure and the patient was kept under observation following institutional policy, for this type of procedure. Post-procedural neurological assessment was performed, showing return to baseline, prior to discharge. The patient was provided with post-procedure discharge instructions, including a section on how to identify potential problems. Should any problems arise concerning this procedure, the patient was given instructions to immediately contact us, at any time, without hesitation. In any case, we plan to contact the patient by telephone for a follow-up status report regarding this interventional procedure.  Comments:  No additional relevant information. 5 out of 5 strength bilateral lower extremity: Plantar flexion, dorsiflexion, knee flexion, knee extension.  Plan of Care    Imaging Orders     DG C-Arm 1-60 Min-No Report Procedure Orders    No procedure(s) ordered today    Medications ordered for procedure: Meds ordered this encounter  Medications  . lactated ringers infusion 1,000 mL  . fentaNYL (SUBLIMAZE) injection 25-100 mcg    Make sure Narcan is available in the pyxis when using this medication. In the event of respiratory depression (RR< 8/min): Titrate NARCAN (naloxone) in increments of 0.1 to 0.2 mg IV at 2-3 minute intervals, until desired degree of reversal.  . ropivacaine (PF) 2 mg/mL (0.2%) (NAROPIN) injection 10 mL  . lidocaine (XYLOCAINE) 2 % (with pres) injection 400 mg  . dexamethasone (DECADRON) injection 10 mg  . iopamidol (ISOVUE-M) 41 % intrathecal injection 10 mL    Medications administered: We administered lactated ringers, fentaNYL, ropivacaine (PF) 2 mg/mL (0.2%), lidocaine, dexamethasone, and iopamidol.  See the medical record for exact dosing, route, and time of administration.  New Prescriptions   No medications on file   Disposition: Discharge home  Discharge Date & Time: 06/16/2018; 1054 hrs.   Physician-requested Follow-up: No follow-ups on file.  Future Appointments  Date Time Provider [place]  07/17/2018  8:00 AM Vevelyn Francois, NP [facility] None   Primary Care Physician: [facility] Location: [facility] Outpatient [facility] Management Facility Note by: Gillis Santa, MD Date: 06/16/2018; Time: 3:09 PM  Disclaimer:  Medicine is not an exact science. The only guarantee in medicine is that nothing is guaranteed. It is important to note that the decision to proceed with this intervention was based on the information collected from the patient. The Data and conclusions were drawn from the patient's questionnaire, the interview, and the physical examination. Because the information was provided in large part by the patient, it cannot be guaranteed that it has not been purposely or unconsciously manipulated. Every effort has been made to obtain as much relevant data as possible for this evaluation. It is important to note that the conclusions that lead to this procedure are derived in large part from the available data. Always take into account that the treatment will also be dependent on availability of resources and existing treatment guidelines, considered by other Pain Management Practitioners as being common knowledge and practice, at the time of the intervention. For Medico-Legal purposes, it is also important to point out that variation in procedural techniques and pharmacological choices are the acceptable norm. The indications, contraindications, technique, and results of the above procedure should only be interpreted  and judged by a Board-Certified Interventional Pain Specialist with extensive familiarity and expertise in the same exact procedure and technique.

## 2018-06-16 NOTE — Patient Instructions (Addendum)
____________________________________________________________________________________________  Blood Thinners  Recommended Time Interval Before and After Neuraxial Block or Catheter Removal  Drug (Generic) Brand Name Time Before Time After Comments  Abciximab Reopro 15 days 2 hours   Alteplase Activase 10 days 10 days   Apixaban Eliquis 3 days 6 hours   Aspirin > 325 mg Goody Powders/Excedrin 11 days  (Usually not stopped)  Aspirin ? 81 mg  7 days  (Usually not stopped)  Cholesterol Medication Lipitor 4 days    Cilostazol Pletal 3 days 5 hours   Clopidogrel Plavix 7-10 days 2 hours   Dabigatran Pradaxa 5 days 6 hours   Delteparin Fragmin 24 hours 4 hours   Dipyridamole + ASA Aggrenox 11days 2 hours   Enoxaparin  Lovenox 24 hours 4 hours   Eptifibatide Integrillin 8 hours 2 hours   Fish oil  4 days    Fondaparinux  Arixtra 72 hours 12 hours   Garlic supplements  7 days    Ginkgo biloba  36 hours    Ginseng  24 hours    Heparin (IV)  4 hours 2 hours   Heparin ([place])  12 hours 2 hours   Hydroxychloroquine Plaquenil 11 days    LMW Heparin  24 hours    LMWH  24 hours    NSAIDs  3 days  (Usually not stopped)  Prasugrel Effient 7-10 days 6 hours   Reteplase Retavase 10 days 10 days   Rivaroxaban Xarelto 3 days 6 hours   Streptokinase Streptase 10 days 10 days   Tenecteplase TNKase 10 days 10 days   Thrombolytics  10 days  10 days Avoid x 10 days after inj.  Ticagrelor Brilinta 5-7 days 6 hours   Ticlodipine Ticlid 10-14 days 2 hours   Tinzaparin Innohep 24 hours 4 hours   Tirofiban Aggrastat 8 hours 2 hours   Vitamin E  4 days    Warfarin Coumadin 5 days 2 hours   ____________________________________________________________________________________________ Post-procedure Information What to expect: Most procedures involve the use of a local anesthetic (numbing medicine), and a steroid (anti-inflammatory medicine).  The local anesthetics may cause temporary numbness and weakness of  the legs or arms, depending on the location of the block. This numbness/weakness may last 4-6 hours, depending on the local anesthetic used. In rare instances, it can last up to 24 hours. While numb, you must be very careful not to injure the extremity.  After any procedure, you could expect the pain to get better within 15-20 minutes. This relief is temporary and may last 4-6 hours. Once the local anesthetics wears off, you could experience discomfort, possibly more than usual, for up to 10 (ten) days. In the case of radiofrequencies, it may last up to 6 weeks. Surgeries may take up to 8 weeks for the healing process. The discomfort is due to the irritation caused by needles going through skin and muscle. To minimize the discomfort, we recommend using ice the first day, and heat from then on. The ice should be applied for 15 minutes on, and 15 minutes off. Keep repeating this cycle until bedtime. Avoid applying the ice directly to the skin, to prevent frostbite. Heat should be used daily, until the pain improves (4-10 days). Be careful not to burn yourself.  Occasionally you may experience muscle spasms or cramps. These occur as a consequence of the irritation caused by the needle sticks to the muscle and the blood that will inevitably be lost into the surrounding muscle tissue. Blood tends to be very  irritating to tissues, which tend to react by going into spasm. These spasms may start the same day of your procedure, but they may also take days to develop. This late onset type of spasm or cramp is usually caused by electrolyte imbalances triggered by the steroids, at the level of the kidney. Cramps and spasms tend to respond well to muscle relaxants, multivitamins (some are triggered by the procedure, but may have their origins in vitamin deficiencies), and "Gatorade", or any sports drinks that can replenish any electrolyte imbalances. (If you are a diabetic, ask your pharmacist to get you a sugar-free brand.)  Warm showers or baths may also be helpful. Stretching exercises are highly recommended. General Instructions:  Be alert for signs of possible infection: redness, swelling, heat, red streaks, elevated temperature, and/or fever. These typically appear 4 to 6 days after the procedure. Immediately notify your doctor if you experience unusual bleeding, difficulty breathing, or loss of bowel or bladder control. If you experience increased pain, do not increase your pain medicine intake, unless instructed by your pain physician. Post-Procedure Care:  Be careful in moving about. Muscle spasms in the area of the injection may occur. Applying ice or heat to the area is often helpful. The incidence of spinal headaches after epidural injections ranges between 1.4% and 6%. If you develop a headache that does not seem to respond to conservative therapy, please let your physician know. This can be treated with an epidural blood patch.   Post-procedure numbness or redness is to be expected, however it should average 4 to 6 hours. If numbness and weakness of your extremities begins to develop 4 to 6 hours after your procedure, and is felt to be progressing and worsening, immediately contact your physician.   Diet:  If you experience nausea, do not eat until this sensation goes away. If you had a "Stellate Ganglion Block" for upper extremity "Reflex Sympathetic Dystrophy", do not eat or drink until your hoarseness goes away. In any case, always start with liquids first and if you tolerate them well, then slowly progress to more solid foods. Activity:  For the first 4 to 6 hours after the procedure, use caution in moving about as you may experience numbness and/or weakness. Use caution in cooking, using household electrical appliances, and climbing steps. If you need to reach your Doctor call our office: [phone]) [phone] Monday-Thursday 8:00 am - 4:00 PM    Fridays: Closed     In case of an emergency: In case of  emergency, call 911 or go to the nearest emergency room and have the physician there call us.  Interpretation of Procedure Every nerve block has two components: a diagnostic component, and a treatment component. Unrealistic expectations are the most common causes of "perceived failure".  In a perfect world, a single nerve block should be able to completely and permanently eliminate the pain. Sadly, the world is not perfect.  Most pain management nerve blocks are performed using local anesthetics and steroids. Steroids are responsible for any long-term benefit that you may experience. Their purpose is to decrease any chronic swelling that may exist in the area. Steroids begin to work immediately after being injected. However, most patients will not experience any benefits until 5 to 10 days after the injection, when the swelling has come down to the point where they can tell a difference. Steroids will only help if there is swelling to be treated. As such, they can assist with the diagnosis. If effective, they suggest an  inflammatory component to the pain, and if ineffective, they rule out inflammation as the main cause or component of the problem. If the problem is one of mechanical compression, you will get no benefit from those steroids.   In the case of local anesthetics, they have a crucial role in the diagnosis of your condition. Most will begin to work within15 to 20 minutes after injection. The duration will depend on the type used (short- vs. Long-acting). It is of outmost importance that patients keep tract of their pain, after the procedure. To assist with this matter, a "Post-procedure Pain Diary" is provided. Make sure to complete it and to bring it back to your follow-up appointment.  As long as the patient keeps accurate, detailed records of their symptoms after every procedure, and returns to have those interpreted, every procedure will provide [place] with invaluable information. Even a block  that does not provide the patient with any relief, will always provide [place] with information about the mechanism and the origin of the pain. The only time a nerve block can be considered a waste of time is when patients do not keep track of the results, or do not keep their post-procedure appointment.  Reporting the results back to your physician The Pain Score  Pain is a subjective complaint. It cannot be seen, touched, or measured. We depend entirely on the patient's report of the pain in order to assess your condition and treatment. To evaluate the pain, we use a pain scale, where "0" means "No Pain", and a "10" is "the worst possible pain that you can even imagine" (i.e. something like been eaten alive by a shark or being torn apart by a lion).   You will frequently be asked to rate your pain. Please be as accurate, remember that medical decisions will be based on your responses. Please do not rate your pain above a 10. Doing so is actually interpreted as "symptom magnification" (exaggeration), as well as lack of understanding with regards to the scale. To put this into perspective, when you tell us that your pain is at a 10 (ten), what you are saying is that there is nothing we can do to make this pain any worse. (Carefully think about that.)

## 2018-06-16 NOTE — Progress Notes (Signed)
Safety precautions to be maintained throughout the outpatient stay will include: orient to surroundings, keep bed in low position, maintain call bell within reach at all times, provide assistance with transfer out of bed and ambulation.  

## 2018-06-17 ENCOUNTER — Telehealth: Payer: Self-pay

## 2018-06-17 NOTE — Telephone Encounter (Signed)
Post procedure phone call.  Patient states she is doing well.  

## 2018-07-01 NOTE — Telephone Encounter (Signed)
05/22/2018 Informed pt to stop taking Eliquis 3 days prior to procedure

## 2018-07-01 NOTE — Telephone Encounter (Signed)
No additional documentation

## 2018-07-17 ENCOUNTER — Encounter: Payer: Self-pay | Admitting: Nurse Practitioner

## 2018-07-17 ENCOUNTER — Other Ambulatory Visit: Payer: Self-pay

## 2018-07-17 ENCOUNTER — Ambulatory Visit: Payer: BLUE CROSS/BLUE SHIELD | Attending: Nurse Practitioner | Admitting: Nurse Practitioner

## 2018-07-17 VITALS — BP 136/68 | HR 74 | Temp 98.2°F | Ht 61.0 in | Wt 112.0 lb

## 2018-07-17 DIAGNOSIS — M47818 Spondylosis without myelopathy or radiculopathy, sacral and sacrococcygeal region: Secondary | ICD-10-CM | POA: Insufficient documentation

## 2018-07-17 DIAGNOSIS — M5136 Other intervertebral disc degeneration, lumbar region: Secondary | ICD-10-CM | POA: Diagnosis present

## 2018-07-17 DIAGNOSIS — G894 Chronic pain syndrome: Secondary | ICD-10-CM | POA: Diagnosis not present

## 2018-07-17 DIAGNOSIS — M47816 Spondylosis without myelopathy or radiculopathy, lumbar region: Secondary | ICD-10-CM | POA: Diagnosis not present

## 2018-07-17 MED ORDER — OXYCODONE-ACETAMINOPHEN 5-325 MG PO TABS: 1.0000 | ORAL_TABLET | Freq: Two times a day (BID) | ORAL | 0 refills | Status: DC | PRN

## 2018-07-17 NOTE — Progress Notes (Signed)
Nursing Pain Medication Assessment:  Safety precautions to be maintained throughout the outpatient stay will include: orient to surroundings, keep bed in low position, maintain call bell within reach at all times, provide assistance with transfer out of bed and ambulation.  Medication Inspection Compliance: Pill count conducted under aseptic conditions, in front of the patient. Neither the pills nor the bottle was removed from the patient's sight at any time. Once count was completed pills were immediately returned to the patient in their original bottle.  Medication: Oxycodone/APAP Pill/Patch Count: 0 of 75 pills remain Pill/Patch Appearance: Markings consistent with prescribed medication Bottle Appearance: Standard pharmacy container. Clearly labeled. Filled Date: 2 / 5 / 2018 Last Medication intake:  Today

## 2018-07-17 NOTE — Progress Notes (Signed)
Patient's Name: [name]  MRN: [id number]  Referring Provider: [facility]  DOB: 1970-10-28  PCP: [facility]  DOS: 07/17/2018  Note by: Dionisio David, NP  Service setting: Ambulatory outpatient  Specialty: Interventional Pain Management  Location: [facility] (AMB) Pain Management Facility    Patient type: Established   HPI  Reason for Visit: [name] is a 48 y.o. year old, female patient, who comes today with a chief complaint of Back Pain Last Appointment: Her last appointment at our practice was on 06/17/2018. I last saw her on 02/18/2018.  Pain Assessment: Today, [name] describes the severity of the Chronic pain as a 5 /10. She indicates the location/referral of the pain to be Back Left/pain radiaities down left leg. Onset was: More than a month ago. The quality of pain is described as Constant, Stabbing. Temporal description, or timing of pain is: Constant. Possible modifying factors: medications. [name]  height is _0  (1.549 m) and weight is 112 lb (50.8 kg). Her temperature is 98.2 F (36.8 C). Her blood pressure is 136/68 and her pulse is 74. Her oxygen saturation is 100%.  She admits that the sacroiliac joint injections have just now started to work.  She is very hopeful because this allows her to be herself for a few months.  She denies any other concerns.  She does not have any side effects of her current medication.  Controlled Substance Pharmacotherapy Assessment REMS (Risk Evaluation and Mitigation Strategy)  Analgesic: Percocet 5 mg twice daily to 3 times daily as needed, quantity 75/month MME/day: 18.75 mg/day.  Chauncey Fischer, RN  07/17/2018  8:22 AM  Sign when Signing Visit Nursing Pain Medication Assessment:  Safety precautions to be maintained throughout the outpatient stay will include: orient to surroundings, keep bed in low position, maintain call bell within reach at all times, provide assistance with transfer out of bed  and ambulation.  Medication Inspection Compliance: Pill count conducted under aseptic conditions, in front of the patient. Neither the pills nor the bottle was removed from the patient's sight at any time. Once count was completed pills were immediately returned to the patient in their original bottle.  Medication: Oxycodone/APAP Pill/Patch Count: 0 of 75 pills remain Pill/Patch Appearance: Markings consistent with prescribed medication Bottle Appearance: Standard pharmacy container. Clearly labeled. Filled Date: 2 / 5 / 2018 Last Medication intake:  Today   Pharmacokinetics: Liberation and absorption (onset of action): WNL Distribution (time to peak effect): WNL Metabolism and excretion (duration of action): WNL         Pharmacodynamics: Desired effects: Analgesia: Ms. Haynes reports >50% benefit. Functional ability: Patient reports that medication allows her to accomplish basic ADLs Clinically meaningful improvement in function (CMIF): Sustained CMIF goals met Perceived effectiveness: Described as relatively effective, allowing for increase in activities of daily living (ADL) Undesirable effects: Side-effects or Adverse reactions: None reported She admits that semetime s the meediain make sher srlppeiny Monitoring: [place] PMP: Online review of the past 6-monthperiod conducted. Compliant with practice rules and regulations Last UDS on record: Summary  Date Value Ref Range Status  02/18/2018 FINAL  Final    Comment:    ==================================================================== TOXASSURE SSELECT 92(MW) ==================================================================== Test                             Result       Flag  Units Drug Present and Declared for Prescription Verification   Oxazepam                       736          EXPECTED   ng/mg creat   Temazepam                      1561         EXPECTED   ng/mg creat    Oxazepam and temazepam are expected metabolites of  diazepam.    Oxazepam is also an expected metabolite of other benzodiazepine    drugs, including chlordiazepoxide, prazepam, clorazepate,    halazepam, and temazepam.  Oxazepam and temazepam are available    as scheduled prescription medications.   Oxycodone                      1555         EXPECTED   ng/mg creat   Oxymorphone                    4264         EXPECTED   ng/mg creat   Noroxycodone                   5839         EXPECTED   ng/mg creat   Noroxymorphone                 2000         EXPECTED   ng/mg creat    Sources of oxycodone are scheduled prescription medications.    Oxymorphone, noroxycodone, and noroxymorphone are expected    metabolites of oxycodone. Oxymorphone is also available as a    scheduled prescription medication. ==================================================================== Test                      Result    Flag   Units      Ref Range   Creatinine              33               mg/dL      >=20 ==================================================================== Declared Medications:  The flagging and interpretation on this report are based on the  following declared medications.  Unexpected results may arise from  inaccuracies in the declared medications.  **Note: The testing scope of this panel includes these medications:  Oxycodone (Oxycodone Acetaminophen)  Temazepam (Restoril)  **Note: The testing scope of this panel does not include following  reported medications:  Acetaminophen  Acetaminophen (Oxycodone Acetaminophen)  Apixaban (Eliquis)  Metoprolol (Toprol)  Ranitidine ==================================================================== For clinical consultation, please call [phone]. ====================================================================    UDS interpretation: Compliant          Medication Assessment Form: Reviewed. Patient indicates being compliant with therapy Treatment compliance: Compliant Risk Assessment  Profile: Aberrant behavior: See initial evaluations. None observed or detected today Comorbid factors increasing risk of overdose: See initial evaluation. No additional risks detected today Opioid risk tool (ORT):  Opioid Risk  07/17/2018  Alcohol 0  Illegal Drugs 0  Rx Drugs 0  Alcohol 0  Illegal Drugs 0  Rx Drugs 0  Age between 16-45 years  0  History of Preadolescent Sexual Abuse 0  Psychological Disease 0  Depression 0  Opioid Risk Tool Scoring 0  Opioid Risk Interpretation Low Risk  ORT Scoring interpretation table:  Score <3 = Low Risk for SUD  Score between 4-7 = Moderate Risk for SUD  Score >8 = High Risk for Opioid Abuse   Risk of substance use disorder (SUD): Low  Risk Mitigation Strategies:  Patient Counseling: Covered Patient-Prescriber Agreement (PPA): Present and active  Notification to other healthcare providers: Done  Pharmacologic Plan: No change in therapy, at this time.             Evaluation of last interventional procedure  2/03/2020Procedure: Bilateral sacroiliac joint injection Pre-procedure pain score:  7/10 Post-procedure pain score: 4/10         Influential Factors: Intra-procedural challenges: None observed.         Reported side-effects: None.        Post-procedural adverse reactions or complications: None reported         Sedation: Please see nurses note for DOS. When no sedatives are used, the analgesic levels obtained are directly associated to the effectiveness of the local anesthetics. However, when sedation is provided, the level of analgesia obtained during the initial 1 hour following the intervention, is believed to be the result of a combination of factors. These factors may include, but are not limited to: 1. The effectiveness of the local anesthetics used. 2. The effects of the analgesic(s) and/or anxiolytic(s) used. 3. The degree of discomfort experienced by the patient at the time of the procedure. 4. The patients ability and  reliability in recalling and recording the events. 5. The presence and influence of possible secondary gains and/or psychosocial factors. Reported result: Relief experienced during the 1st hour after the procedure: 100 % (Ultra-Short Term Relief)            Interpretative annotation: Clinically appropriate result. Analgesia during this period is likely to be Local Anesthetic and/or IV Sedative (Analgesic/Anxiolytic) related.          Effects of local anesthetic: The analgesic effects attained during this period are directly associated to the localized infiltration of local anesthetics and therefore cary significant diagnostic value as to the etiological location, or anatomical origin, of the pain. Expected duration of relief is directly dependent on the pharmacodynamics of the local anesthetic used. Long-acting (4-6 hours) anesthetics used.  Reported result: Relief during the next 4 to 6 hour after the procedure: 100 % (Short-Term Relief)            Interpretative annotation: Clinically appropriate result. Analgesia during this period is likely to be Local Anesthetic-related.          Long-term benefit: Defined as the period of time past the expected duration of local anesthetics (1 hour for short-acting and 4-6 hours for long-acting). With the possible exception of prolonged sympathetic blockade from the local anesthetics, benefits during this period are typically attributed to, or associated with, other factors such as analgesic sensory neuropraxia, antiinflammatory effects, or beneficial biochemical changes provided by agents other than the local anesthetics.  Reported result: Extended relief following procedure: 0 % (Long-Term Relief)            Interpretative annotation:   Writer than 50% relief today. She is reporting increase in function and range of motion. Good relief. Therapeutic success. Inflammation plays a part in the etiology to the pain.          ROS  Constitutional: Denies any fever or  chills Gastrointestinal: No reported hemesis, hematochezia, vomiting, or acute GI distress Musculoskeletal: Denies any acute onset joint swelling, redness, loss of ROM, or  weakness Neurological: No reported episodes of acute onset apraxia, aphasia, dysarthria, agnosia, amnesia, paralysis, loss of coordination, or loss of consciousness  Medication Review  acetaminophen, apixaban, famotidine, metoprolol succinate, oxyCODONE-acetaminophen, raNITIdine HCl, and temazepam  History Review  Allergy: [name] is allergic to buprenorphine; cefaclor; gabapentin; ibuprofen; levofloxacin; macrobid [nitrofurantoin monohyd macro]; naproxen; and penicillins. Drug: [name]  reports no history of drug use. Alcohol:  reports no history of alcohol use. Tobacco:  reports that she has been smoking cigarettes. She has a 20.00 pack-year smoking history. She has never used smokeless tobacco. Social: [name]  reports that she has been smoking cigarettes. She has a 20.00 pack-year smoking history. She has never used smokeless tobacco. She reports that she does not drink alcohol or use drugs. Medical:  has a past medical history of Chronic back pain, Hypertension, Migraine, and Plumbism. Surgical: [name]  has a past surgical history that includes Abdominal hysterectomy; Cholecystectomy; and Hand surgery. Family: family history includes Cancer in an other family member; Diabetes in an other family member; Seizures in an other family member; Stroke in an other family member. Problem List: [name] does not have any pertinent problems on file.  Lab Review  Kidney Function Lab Results  Component Value Date   BUN 7 05/16/2018   CREATININE 0.75 05/16/2018   GFRAA >60 05/16/2018   GFRNONAA >60 05/16/2018  Liver Function Lab Results  Component Value Date   AST 17 03/24/2013   ALT 8 03/24/2013   ALBUMIN 3.6 03/24/2013  Note: Above Lab results reviewed.  Imaging Review  DG C-Arm 1-60 Min-No  Report Fluoroscopy was utilized by the requesting physician.  No radiographic  interpretation.  Note: Reviewed        Physical Exam  General appearance: Well nourished, well developed, and well hydrated. In no apparent acute distress Mental status: Alert, oriented x 3 (person, place, & time)       Respiratory: No evidence of acute respiratory distress Eyes: PERLA Vitals: BP 136/68   Pulse 74   Temp 98.2 F (36.8 C)   Ht _0  (1.549 m)   Wt 112 lb (50.8 kg)   SpO2 100%   BMI 21.16 kg/m  BMI: Estimated body mass index is 21.16 kg/m as calculated from the following:   Height as of this encounter: _1  (1.549 m).   Weight as of this encounter: 112 lb (50.8 kg). Ideal: Ideal body weight: 47.8 kg (105 lb 6.1 oz) Adjusted ideal body weight: 49 kg (108 lb 0.4 oz) Lumbar Spine Area Exam  Skin & Axial Inspection: No masses, redness, or swelling Alignment: Symmetrical Functional ROM: Unrestricted ROM       Stability: No instability detected Muscle Tone/Strength: Functionally intact. No obvious neuro-muscular anomalies detected. Sensory (Neurological): Unimpaired Palpation: Complains of area being tender to palpation       Provocative Tests: Hyperextension/rotation test: deferred today       Lumbar quadrant test (Kemp's test): deferred today       Lateral bending test: deferred today       Patrick's Maneuver: (+) for left-sided S-I arthralgia              Gait & Posture Assessment  Ambulation: Unassisted Gait: Relatively normal for age and body habitus Posture: WNL  Lower Extremity Exam    Side: Right lower extremity  Side: Left lower extremity  Stability: No instability observed          Stability: No instability observed  Skin & Extremity Inspection: Skin color, temperature, and hair growth are WNL. No peripheral edema or cyanosis. No masses, redness, swelling, asymmetry, or associated skin lesions. No contractures.  Skin & Extremity Inspection: Skin color, temperature,  and hair growth are WNL. No peripheral edema or cyanosis. No masses, redness, swelling, asymmetry, or associated skin lesions. No contractures.  Functional ROM: Unrestricted ROM                  Functional ROM: Unrestricted ROM                  Muscle Tone/Strength: Functionally intact. No obvious neuro-muscular anomalies detected.  Muscle Tone/Strength: Functionally intact. No obvious neuro-muscular anomalies detected.  Sensory (Neurological): Referred pain pattern        Sensory (Neurological): Dermatomal pain pattern top of foot & big toe (L5)      Palpation: No palpable anomalies  Palpation: No palpable anomalies   Assessment   Status Diagnosis  Controlled Controlled Controlled 1. Lumbar spondylosis   2. SI joint arthritis   3. Lumbar degenerative disc disease   4. Chronic pain syndrome      Updated Problems: No problems updated.  Plan of Care  Pharmacotherapy (Medications Ordered): Meds ordered this encounter  Medications  . oxyCODONE-acetaminophen (PERCOCET) 5-325 MG tablet    Sig: Take 1-2 tablets by mouth 2 (two) times daily as needed for up to 30 days for severe pain.    Dispense:  75 tablet    Refill:  0    Do not place this medication, or any other prescription from our practice, on "Automatic Refill". Patient may have prescription filled one day early if pharmacy is closed on scheduled refill date.    Order Specific Question:   Supervising Provider    Answer:   Gillis Santa [[id number]]  . oxyCODONE-acetaminophen (PERCOCET) 5-325 MG tablet    Sig: Take 1-2 tablets by mouth 2 (two) times daily as needed for up to 30 days for severe pain.    Dispense:  75 tablet    Refill:  0    Do not place this medication, or any other prescription from our practice, on "Automatic Refill". Patient may have prescription filled one day early if pharmacy is closed on scheduled refill date.    Order Specific Question:   Supervising Provider    Answer:   Gillis Santa [[id number]]    Administered today: [name] had no medications administered during this visit.  Orders:  No orders of the defined types were placed in this encounter.  Follow-up plan:   Return in about 2 months (around 09/16/2018) for MedMgmt..   Note by: Dionisio David, NP Date: 07/17/2018; Time: 1:00 PM

## 2018-07-17 NOTE — Patient Instructions (Signed)
____________________________________________________________________________________________  Medication Rules  Purpose: To inform patients, and their family members, of our rules and regulations.  Applies to: All patients receiving prescriptions (written or electronic).  Pharmacy of record: Pharmacy where electronic prescriptions will be sent. If written prescriptions are taken to a different pharmacy, please inform the nursing staff. The pharmacy listed in the electronic medical record should be the one where you would like electronic prescriptions to be sent.  Electronic prescriptions: In compliance with the [place] Strengthen Opioid Misuse Prevention (STOP) Act of 2017 (Session Law 2017-74/H243), effective May 14, 2018, all controlled substances must be electronically prescribed. Calling prescriptions to the pharmacy will cease to exist.  Prescription refills: Only during scheduled appointments. Applies to all prescriptions.  NOTE: The following applies primarily to controlled substances (Opioid* Pain Medications).   Patient's responsibilities: 1. Pain Pills: Bring all pain pills to every appointment (except for procedure appointments). 2. Pill Bottles: Bring pills in original pharmacy bottle. Always bring the newest bottle. Bring bottle, even if empty. 3. Medication refills: You are responsible for knowing and keeping track of what medications you take and those you need refilled. The day before your appointment: write a list of all prescriptions that need to be refilled. The day of the appointment: give the list to the admitting nurse. Prescriptions will be written only during appointments. No prescriptions will be written on procedure days. If you forget a medication: it will not be "Called in", "Faxed", or "electronically sent". You will need to get another appointment to get these prescribed. No early refills. Do not call asking to have your prescription filled  early. 4. Prescription Accuracy: You are responsible for carefully inspecting your prescriptions before leaving our office. Have the discharge nurse carefully go over each prescription with you, before taking them home. Make sure that your name is accurately spelled, that your address is correct. Check the name and dose of your medication to make sure it is accurate. Check the number of pills, and the written instructions to make sure they are clear and accurate. Make sure that you are given enough medication to last until your next medication refill appointment. 5. Taking Medication: Take medication as prescribed. When it comes to controlled substances, taking less pills or less frequently than prescribed is permitted and encouraged. Never take more pills than instructed. Never take medication more frequently than prescribed.  6. Inform other Doctors: Always inform, all of your healthcare providers, of all the medications you take. 7. Pain Medication from other Providers: You are not allowed to accept any additional pain medication from any other Doctor or Healthcare provider. There are two exceptions to this rule. (see below) In the event that you require additional pain medication, you are responsible for notifying us, as stated below. 8. Medication Agreement: You are responsible for carefully reading and following our Medication Agreement. This must be signed before receiving any prescriptions from our practice. Safely store a copy of your signed Agreement. Violations to the Agreement will result in no further prescriptions. (Additional copies of our Medication Agreement are available upon request.) 9. Laws, Rules, & Regulations: All patients are expected to follow all Federal and State Laws, Statutes, Rules, & Regulations. Ignorance of the Laws does not constitute a valid excuse. The use of any illegal substances is prohibited. 10. Adopted CDC guidelines & recommendations: Target dosing levels will be  at or below 60 MME/day. Use of benzodiazepines** is not recommended.  Exceptions: There are only two exceptions to the rule of not   receiving pain medications from other Healthcare Providers. 1. Exception #1 (Emergencies): In the event of an emergency (i.e.: accident requiring emergency care), you are allowed to receive additional pain medication. However, you are responsible for: As soon as you are able, call our office [phone], at any time of the day or night, and leave a message stating your name, the date and nature of the emergency, and the name and dose of the medication prescribed. In the event that your call is answered by a member of our staff, make sure to document and save the date, time, and the name of the person that took your information.  2. Exception #2 (Planned Surgery): In the event that you are scheduled by another doctor or dentist to have any type of surgery or procedure, you are allowed (for a period no longer than 30 days), to receive additional pain medication, for the acute post-op pain. However, in this case, you are responsible for picking up a copy of our "Post-op Pain Management for Surgeons" handout, and giving it to your surgeon or dentist. This document is available at our office, and does not require an appointment to obtain it. Simply go to our office during business hours (Monday-Thursday from 8:00 AM to 4:00 PM) (Friday 8:00 AM to 12:00 Noon) or if you have a scheduled appointment with us, prior to your surgery, and ask for it by name. In addition, you will need to provide us with your name, name of your surgeon, type of surgery, and date of procedure or surgery.  *Opioid medications include: morphine, codeine, oxycodone, oxymorphone, hydrocodone, hydromorphone, meperidine, tramadol, tapentadol, buprenorphine, fentanyl, methadone. **Benzodiazepine medications include: diazepam (Valium), alprazolam (Xanax), clonazepam (Klonopine), lorazepam (Ativan), clorazepate  (Tranxene), chlordiazepoxide (Librium), estazolam (Prosom), oxazepam (Serax), temazepam (Restoril), triazolam (Halcion) (Last updated: 07/11/2017) ____________________________________________________________________________________________    

## 2018-09-16 ENCOUNTER — Ambulatory Visit: Payer: BLUE CROSS/BLUE SHIELD | Attending: Nurse Practitioner | Admitting: Nurse Practitioner

## 2018-09-16 ENCOUNTER — Other Ambulatory Visit: Payer: Self-pay

## 2018-09-16 DIAGNOSIS — M47816 Spondylosis without myelopathy or radiculopathy, lumbar region: Secondary | ICD-10-CM

## 2018-09-16 DIAGNOSIS — M47818 Spondylosis without myelopathy or radiculopathy, sacral and sacrococcygeal region: Secondary | ICD-10-CM | POA: Diagnosis not present

## 2018-09-16 DIAGNOSIS — G8929 Other chronic pain: Secondary | ICD-10-CM

## 2018-09-16 DIAGNOSIS — G894 Chronic pain syndrome: Secondary | ICD-10-CM

## 2018-09-16 DIAGNOSIS — M5442 Lumbago with sciatica, left side: Secondary | ICD-10-CM

## 2018-09-16 DIAGNOSIS — M5136 Other intervertebral disc degeneration, lumbar region: Secondary | ICD-10-CM | POA: Diagnosis not present

## 2018-09-16 MED ORDER — OXYCODONE-ACETAMINOPHEN 5-325 MG PO TABS: 1.0000 | ORAL_TABLET | Freq: Two times a day (BID) | ORAL | 0 refills | Status: DC | PRN

## 2018-09-16 NOTE — Patient Instructions (Signed)
____________________________________________________________________________________________  Medication Rules  Purpose: To inform patients, and their family members, of our rules and regulations.  Applies to: All patients receiving prescriptions (written or electronic).  Pharmacy of record: Pharmacy where electronic prescriptions will be sent. If written prescriptions are taken to a different pharmacy, please inform the nursing staff. The pharmacy listed in the electronic medical record should be the one where you would like electronic prescriptions to be sent.  Electronic prescriptions: In compliance with the [place] Strengthen Opioid Misuse Prevention (STOP) Act of 2017 (Session Law 2017-74/H243), effective May 14, 2018, all controlled substances must be electronically prescribed. Calling prescriptions to the pharmacy will cease to exist.  Prescription refills: Only during scheduled appointments. Applies to all prescriptions.  NOTE: The following applies primarily to controlled substances (Opioid* Pain Medications).   Patient's responsibilities: 1. Pain Pills: Bring all pain pills to every appointment (except for procedure appointments). 2. Pill Bottles: Bring pills in original pharmacy bottle. Always bring the newest bottle. Bring bottle, even if empty. 3. Medication refills: You are responsible for knowing and keeping track of what medications you take and those you need refilled. The day before your appointment: write a list of all prescriptions that need to be refilled. The day of the appointment: give the list to the admitting nurse. Prescriptions will be written only during appointments. No prescriptions will be written on procedure days. If you forget a medication: it will not be "Called in", "Faxed", or "electronically sent". You will need to get another appointment to get these prescribed. No early refills. Do not call asking to have your prescription filled  early. 4. Prescription Accuracy: You are responsible for carefully inspecting your prescriptions before leaving our office. Have the discharge nurse carefully go over each prescription with you, before taking them home. Make sure that your name is accurately spelled, that your address is correct. Check the name and dose of your medication to make sure it is accurate. Check the number of pills, and the written instructions to make sure they are clear and accurate. Make sure that you are given enough medication to last until your next medication refill appointment. 5. Taking Medication: Take medication as prescribed. When it comes to controlled substances, taking less pills or less frequently than prescribed is permitted and encouraged. Never take more pills than instructed. Never take medication more frequently than prescribed.  6. Inform other Doctors: Always inform, all of your healthcare providers, of all the medications you take. 7. Pain Medication from other Providers: You are not allowed to accept any additional pain medication from any other Doctor or Healthcare provider. There are two exceptions to this rule. (see below) In the event that you require additional pain medication, you are responsible for notifying us, as stated below. 8. Medication Agreement: You are responsible for carefully reading and following our Medication Agreement. This must be signed before receiving any prescriptions from our practice. Safely store a copy of your signed Agreement. Violations to the Agreement will result in no further prescriptions. (Additional copies of our Medication Agreement are available upon request.) 9. Laws, Rules, & Regulations: All patients are expected to follow all Federal and State Laws, Statutes, Rules, & Regulations. Ignorance of the Laws does not constitute a valid excuse. The use of any illegal substances is prohibited. 10. Adopted CDC guidelines & recommendations: Target dosing levels will be  at or below 60 MME/day. Use of benzodiazepines** is not recommended.  Exceptions: There are only two exceptions to the rule of not   receiving pain medications from other Healthcare Providers. 1. Exception #1 (Emergencies): In the event of an emergency (i.e.: accident requiring emergency care), you are allowed to receive additional pain medication. However, you are responsible for: As soon as you are able, call our office [phone], at any time of the day or night, and leave a message stating your name, the date and nature of the emergency, and the name and dose of the medication prescribed. In the event that your call is answered by a member of our staff, make sure to document and save the date, time, and the name of the person that took your information.  2. Exception #2 (Planned Surgery): In the event that you are scheduled by another doctor or dentist to have any type of surgery or procedure, you are allowed (for a period no longer than 30 days), to receive additional pain medication, for the acute post-op pain. However, in this case, you are responsible for picking up a copy of our "Post-op Pain Management for Surgeons" handout, and giving it to your surgeon or dentist. This document is available at our office, and does not require an appointment to obtain it. Simply go to our office during business hours (Monday-Thursday from 8:00 AM to 4:00 PM) (Friday 8:00 AM to 12:00 Noon) or if you have a scheduled appointment with us, prior to your surgery, and ask for it by name. In addition, you will need to provide us with your name, name of your surgeon, type of surgery, and date of procedure or surgery.  *Opioid medications include: morphine, codeine, oxycodone, oxymorphone, hydrocodone, hydromorphone, meperidine, tramadol, tapentadol, buprenorphine, fentanyl, methadone. **Benzodiazepine medications include: diazepam (Valium), alprazolam (Xanax), clonazepam (Klonopine), lorazepam (Ativan), clorazepate  (Tranxene), chlordiazepoxide (Librium), estazolam (Prosom), oxazepam (Serax), temazepam (Restoril), triazolam (Halcion) (Last updated: 07/11/2017) ____________________________________________________________________________________________    

## 2018-09-16 NOTE — Progress Notes (Signed)
Pain Management Encounter Note - Virtual Visit via Telephone Telehealth (real-time audio visits between healthcare provider and patient).  Patient's Phone No. & Preferred Pharmacy:  [phone] (home); [phone] (mobile); (Preferred) [phone]  [place], [place] - [address] [place] [address] Phone: [phone] Fax: [phone]   Pre-screening note:  Our staff contacted Ms. Perdew and offered her an "in person", "face-to-face" appointment versus a telephone encounter. She indicated preferring the telephone encounter, at this time.  Reason for Virtual Visit: COVID-19*  Social distancing based on CDC and AMA recommendations.   I contacted [name] on 09/16/2018 at 8:31 AM by telephone and clearly identified myself as Dionisio David, NP. I verified that I was speaking with the correct person using two identifiers (Name and date of birth: December 17, 1970).  Advanced Informed Consent I sought verbal advanced consent from [name] for telemedicine interactions and virtual visit. I informed Ms. Egley of the security and privacy concerns, risks, and limitations associated with performing an evaluation and management service by telephone. I also informed Ms. Graeff of the availability of "in person" appointments and I informed her of the possibility of a patient responsible charge related to this service. Ms. Chea expressed understanding and agreed to proceed.   Historic Elements   Ms. KAJOL CRISPEN is a 48 y.o. year old, female patient evaluated today after her last encounter by our practice on 07/17/2018. Ms. Olivos  has a past medical history of Chronic back pain, Hypertension, Migraine, and Plumbism. She also  has a past surgical history that includes Abdominal hysterectomy; Cholecystectomy; and Hand surgery. Ms. Vanburen has a current medication list which includes the following prescription(s): acetaminophen, eliquis, famotidine,  metoprolol succinate, oxycodone-acetaminophen, oxycodone-acetaminophen, ranitidine hcl, and temazepam. She  reports that she has been smoking cigarettes. She has a 20.00 pack-year smoking history. She has never used smokeless tobacco. She reports that she does not drink alcohol or use drugs. Ms. Erlandson is allergic to buprenorphine; cefaclor; gabapentin; ibuprofen; levofloxacin; macrobid [nitrofurantoin monohyd macro]; naproxen; and penicillins.   HPI  I last saw her on 07/17/2018. She is being evaluated for medication management. She has 7/10 lower back and left leg pain. She has numbness, tingling and weakness in her leg. The pain goes into her foot. She would like to have another procedure when this is resumed. She admits that the pain is getting worse and she has noticed more swelling. The pain does get up to a 9/10. She has to stay in bed when this occurs. She denies any side effects of her current regimen.   Pharmacotherapy Assessment  Analgesic:Percocet 5 mg twice daily to 3 times daily as needed, quantity 75/month MME/day:18.75mg /day. .   Monitoring: Pharmacotherapy: No side-effects or adverse reactions reported. [place] PMP: PDMP reviewed during this encounter.       Compliance: No problems identified. Plan: Refer to "POC".  Review of recent tests  DG C-Arm 1-60 Min-No Report Fluoroscopy was utilized by the requesting physician.  No radiographic  interpretation.    Admission on 05/15/2018, Discharged on 05/16/2018  Component Date Value Ref Range Status  . Sodium 05/16/2018 140  135 - 145 mmol/L Final  . Potassium 05/16/2018 3.0* 3.5 - 5.1 mmol/L Final  . Chloride 05/16/2018 108  98 - 111 mmol/L Final  . CO2 05/16/2018 21* 22 - 32 mmol/L Final  . Glucose, Bld 05/16/2018 98  70 - 99 mg/dL Final  . BUN 05/16/2018 7  6 - 20 mg/dL  Final  . Creatinine, Ser 05/16/2018 0.75  0.44 - 1.00 mg/dL Final  . Calcium 05/16/2018 8.9  8.9 - 10.3 mg/dL Final  . GFR calc non Af Amer 05/16/2018 >60  >60  mL/min Final  . GFR calc Af Amer 05/16/2018 >60  >60 mL/min Final  . Anion gap 05/16/2018 11  5 - 15 Final   Performed at [facility], [address], [place], [place] [address]  . WBC 05/16/2018 7.6  4.0 - 10.5 K/uL Final  . RBC 05/16/2018 3.85* 3.87 - 5.11 MIL/uL Final  . Hemoglobin 05/16/2018 10.7* 12.0 - 15.0 g/dL Final  . HCT 05/16/2018 35.2* 36.0 - 46.0 % Final  . MCV 05/16/2018 91.4  80.0 - 100.0 fL Final  . MCH 05/16/2018 27.8  26.0 - 34.0 pg Final  . MCHC 05/16/2018 30.4  30.0 - 36.0 g/dL Final  . RDW 05/16/2018 16.1* 11.5 - 15.5 % Final  . Platelets 05/16/2018 469* 150 - 400 K/uL Final  . nRBC 05/16/2018 0.0  0.0 - 0.2 % Final  . Neutrophils Relative % 05/16/2018 57  % Final  . Neutro Abs 05/16/2018 4.4  1.7 - 7.7 K/uL Final  . Lymphocytes Relative 05/16/2018 32  % Final  . Lymphs Abs 05/16/2018 2.4  0.7 - 4.0 K/uL Final  . Monocytes Relative 05/16/2018 9  % Final  . Monocytes Absolute 05/16/2018 0.7  0.1 - 1.0 K/uL Final  . Eosinophils Relative 05/16/2018 1  % Final  . Eosinophils Absolute 05/16/2018 0.1  0.0 - 0.5 K/uL Final  . Basophils Relative 05/16/2018 1  % Final  . Basophils Absolute 05/16/2018 0.1  0.0 - 0.1 K/uL Final  . Immature Granulocytes 05/16/2018 0  % Final  . Abs Immature Granulocytes 05/16/2018 0.03  0.00 - 0.07 K/uL Final   Performed at [facility], [address] [place], [place] [address]  . D-Dimer, Quant 05/16/2018 <0.27  0.00 - 0.50 ug/mL-FEU Final   Comment: (NOTE) At the manufacturer cut-off of 0.50 ug/mL FEU, this assay has been documented to exclude PE with a sensitivity and negative predictive value of 97 to 99%.  At this time, this assay has not been approved by the FDA to exclude DVT/VTE. Results should be correlated with clinical presentation. 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 Diagnoses of Lumbar spondylosis, Chronic pain syndrome,  Lumbar degenerative disc disease, and Chronic left-sided low back pain with left-sided sciatica were also pertinent to this visit.  Plan of Care  I have changed Verna J. Mckeough's oxyCODONE-acetaminophen. I am also having her maintain her acetaminophen, metoprolol succinate, RaNITidine HCl (ZANTAC PO), temazepam, Eliquis, famotidine, and oxyCODONE-acetaminophen.  Pharmacotherapy (Medications Ordered): Meds ordered this encounter  Medications  . oxyCODONE-acetaminophen (PERCOCET) 5-325 MG tablet    Sig: Take 1-2 tablets by mouth 2 (two) times daily as needed for up to 30 days for severe pain.    Dispense:  75 tablet    Refill:  0    Do not place this medication, or any other prescription from our practice, on "Automatic Refill". Patient may have prescription filled one day early if pharmacy is closed on scheduled refill date.    Order Specific Question:   Supervising Provider    Answer:   Gillis Santa [[id number]]  . oxyCODONE-acetaminophen (PERCOCET) 5-325 MG tablet    Sig: Take 1-2 tablets by mouth 2 (two) times daily as needed for up to 30  days for severe pain.    Dispense:  75 tablet    Refill:  0    Do not place this medication, or any other prescription from our practice, on "Automatic Refill". Patient may have prescription filled one day early if pharmacy is closed on scheduled refill date.    Order Specific Question:   Supervising Provider    Answer:   Gillis Santa [[id number]]   Orders:  No orders of the defined types were placed in this encounter.  Follow-up plan:   Return in about 2 months (around 11/16/2018) for MedMgmt.   I discussed the assessment and treatment plan with the patient. The patient was provided an opportunity to ask questions and all were answered. The patient agreed with the plan and demonstrated an understanding of the instructions.  Patient advised to call back or seek an in-person evaluation if the symptoms or condition worsens.  Total duration of  non-face-to-face encounter: 12 minutes.  Note by: Dionisio David, NP Date: 09/16/2018; Time: 8:39 AM  Disclaimer:  * Given the special circumstances of the COVID-19 pandemic, the federal government has announced that the Office for Civil Rights (OCR) will exercise its enforcement discretion and will not impose penalties on physicians using telehealth in the event of noncompliance with regulatory requirements under the [place] (HIPAA) in connection with the good faith provision of telehealth during the VQXIH-03 national public health emergency. ([place])

## 2018-11-12 ENCOUNTER — Encounter: Payer: Self-pay | Admitting: Student in an Organized Health Care Education/Training Program

## 2018-11-13 ENCOUNTER — Other Ambulatory Visit: Payer: Self-pay

## 2018-11-13 ENCOUNTER — Ambulatory Visit
Payer: BC Managed Care – PPO | Attending: Student in an Organized Health Care Education/Training Program | Admitting: Student in an Organized Health Care Education/Training Program

## 2018-11-13 ENCOUNTER — Encounter: Payer: Self-pay | Admitting: Student in an Organized Health Care Education/Training Program

## 2018-11-13 DIAGNOSIS — S335XXA Sprain of ligaments of lumbar spine, initial encounter: Secondary | ICD-10-CM

## 2018-11-13 DIAGNOSIS — M5442 Lumbago with sciatica, left side: Secondary | ICD-10-CM

## 2018-11-13 DIAGNOSIS — G894 Chronic pain syndrome: Secondary | ICD-10-CM

## 2018-11-13 DIAGNOSIS — M5136 Other intervertebral disc degeneration, lumbar region: Secondary | ICD-10-CM | POA: Diagnosis not present

## 2018-11-13 DIAGNOSIS — M5416 Radiculopathy, lumbar region: Secondary | ICD-10-CM

## 2018-11-13 DIAGNOSIS — M47816 Spondylosis without myelopathy or radiculopathy, lumbar region: Secondary | ICD-10-CM

## 2018-11-13 DIAGNOSIS — Z79891 Long term (current) use of opiate analgesic: Secondary | ICD-10-CM

## 2018-11-13 DIAGNOSIS — M47818 Spondylosis without myelopathy or radiculopathy, sacral and sacrococcygeal region: Secondary | ICD-10-CM | POA: Diagnosis not present

## 2018-11-13 DIAGNOSIS — M5441 Lumbago with sciatica, right side: Secondary | ICD-10-CM

## 2018-11-13 DIAGNOSIS — G8929 Other chronic pain: Secondary | ICD-10-CM

## 2018-11-13 MED ORDER — OXYCODONE-ACETAMINOPHEN 5-325 MG PO TABS: 1.0000 | ORAL_TABLET | Freq: Three times a day (TID) | ORAL | 0 refills | Status: DC | PRN

## 2018-11-13 NOTE — Progress Notes (Signed)
Pain Management Virtual Encounter Note - Virtual Visit via Telephone Telehealth (real-time audio visits between healthcare provider and patient).   Patient's Phone No. & Preferred Pharmacy:  [phone] (home); [phone] (mobile); (Preferred) [phone] No e-mail address on record  [place], [place] - [address] [place] [address] Phone: [phone] Fax: [phone]    Pre-screening note:  Our staff contacted [name] and offered her an "in person", "face-to-face" appointment versus a telephone encounter. She indicated preferring the telephone encounter, at this time.   Reason for Virtual Visit: COVID-19*  Social distancing based on CDC and AMA recommendations.   I contacted [name] on 11/13/2018 via telephone.      I clearly identified myself as Gillis Santa, MD. I verified that I was speaking with the correct person using two identifiers (Name: [name], and date of birth: 23-Aug-1970).  Advanced Informed Consent I sought verbal advanced consent from [name] for virtual visit interactions. I informed [name] of possible security and privacy concerns, risks, and limitations associated with providing "not-in-person" medical evaluation and management services. I also informed [name] of the availability of "in-person" appointments. Finally, I informed her that there would be a charge for the virtual visit and that she could be  personally, fully or partially, financially responsible for it. [name] expressed understanding and agreed to proceed.   Historic Elements   [name] is a 48 y.o. year old, female patient evaluated today after her last encounter by our practice on 09/16/2018. [name]  has a past medical history of Chronic back pain, Hypertension, Migraine, and Plumbism. She also  has a past surgical history that includes Abdominal hysterectomy; Cholecystectomy; and Hand surgery. [name]  has a current medication list which includes the following prescription(s): acetaminophen, eliquis, famotidine, metoprolol succinate, oxycodone-acetaminophen, oxycodone-acetaminophen, temazepam, and ranitidine hcl. She  reports that she has been smoking cigarettes. She has a 20.00 pack-year smoking history. She has never used smokeless tobacco. She reports that she does not drink alcohol or use drugs. [name] is allergic to buprenorphine; cefaclor; gabapentin; ibuprofen; levofloxacin; macrobid [nitrofurantoin monohyd macro]; naproxen; and penicillins.   HPI  Today, she is being contacted for medication management.  Request for repeat procedure.  Patient is endorsing increased pain overlying her sacroiliac joint.  Her previous injection was in February 2019.  She is requesting repeat sacroiliac joint injection as she states that every 3 month injection significantly helped her pain and improve her functional status.  Patient is on Eliquis.  I instructed the patient to stop this medication for 3 days prior to her scheduled procedure and also reach out to her prescribing provider and inform them that she will be stopping this for her sacroiliac joint injection.  Patient has received formal clearance in the past to stop this medication.  Furthermore we will refill her medications as below.  Given that the patient is having a pain flare related to her SI joint arthralgia, okay for temporary increase and Percocet from quantity 75 to quantity 90 for the month.  However I informed the patient that her prescription for the following month will be reduced back down to his previous dose of quantity 75.  Patient for understanding.  Pharmacotherapy Assessment  Analgesic: 10/16/2018  1   09/16/2018  Oxycodone-Acetaminophen 5-325  75.00 30 Cr Kin   [id number]   Nor (6917)   0  18.75 MME  Private Pay   [place]  Monitoring: Pharmacotherapy: No side-effects or adverse reactions reported. [place] PMP: PDMP reviewed during this  encounter.       Compliance: No problems identified. Effectiveness: Clinically acceptable. Plan: Refer to "POC".  Pertinent Labs   SAFETY SCREENING Profile No results found for: [place], [place], STAPHAUREUS, MRSAPCR, HCVAB, HIV, PREGTESTUR Renal Function Lab Results  Component Value Date   BUN 7 05/16/2018   CREATININE 0.75 05/16/2018   GFRAA >60 05/16/2018   GFRNONAA >60 05/16/2018   Hepatic Function Lab Results  Component Value Date   AST 17 03/24/2013   ALT 8 03/24/2013   ALBUMIN 3.6 03/24/2013   UDS Summary  Date Value Ref Range Status  02/18/2018 FINAL  Final    Comment:    ==================================================================== TOXASSURE SELECT 13 (MW) ==================================================================== Test                             Result       Flag       Units Drug Present and Declared for Prescription Verification   Oxazepam                       736          EXPECTED   ng/mg creat   Temazepam                      1561         EXPECTED   ng/mg creat    Oxazepam and temazepam are expected metabolites of diazepam.    Oxazepam is also an expected metabolite of other benzodiazepine    drugs, including chlordiazepoxide, prazepam, clorazepate,    halazepam, and temazepam.  Oxazepam and temazepam are available    as scheduled prescription medications.   Oxycodone                      1555         EXPECTED   ng/mg creat   Oxymorphone                    4264         EXPECTED   ng/mg creat   Noroxycodone                   5839         EXPECTED   ng/mg creat   Noroxymorphone                 2000         EXPECTED   ng/mg creat    Sources of oxycodone are scheduled prescription medications.    Oxymorphone, noroxycodone, and noroxymorphone are expected    metabolites of oxycodone. Oxymorphone is also available as a    scheduled prescription medication. ==================================================================== Test                       Result    Flag   Units      Ref Range   Creatinine              33               mg/dL      >=20 ==================================================================== Declared Medications:  The flagging and interpretation on this report are based on the  following declared medications.  Unexpected results may arise from  inaccuracies in the declared medications.  **  Note: The testing scope of this panel includes these medications:  Oxycodone (Oxycodone Acetaminophen)  Temazepam (Restoril)  **Note: The testing scope of this panel does not include following  reported medications:  Acetaminophen  Acetaminophen (Oxycodone Acetaminophen)  Apixaban (Eliquis)  Metoprolol (Toprol)  Ranitidine ==================================================================== For clinical consultation, please call [phone]. ====================================================================    Note: Above Lab results reviewed.  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 Diagnoses of Chronic pain syndrome, Lumbar spondylosis, Lumbar degenerative disc disease, Chronic left-sided low back pain with left-sided sciatica, Long term prescription opiate use, Chronic bilateral low back pain with bilateral sciatica, Lumbar radiculopathy, and Lumbar sprain, initial encounter were also pertinent to this visit.  Plan of Care  I have changed [name]'s oxyCODONE-acetaminophen. I am also having her start on oxyCODONE-acetaminophen. Additionally, I am having her maintain her acetaminophen, metoprolol succinate, RaNITidine HCl (ZANTAC PO), temazepam, Eliquis, and famotidine.  Patient instructed to stop Eliquis 3 days prior to scheduled procedure.  She is also advised to call and inform her prescribing provider that she will be stopping this medication for 3 days for an elective procedure.  Also refill medications as below.  Patient was also made aware of n.p.o. guidelines  of 8 hours given that she would like sedation for procedure.  Pharmacotherapy (Medications Ordered): Meds ordered this encounter  Medications  . oxyCODONE-acetaminophen (PERCOCET) 5-325 MG tablet    Sig: Take 1-2 tablets by mouth every 8 (eight) hours as needed for severe pain.    Dispense:  90 tablet    Refill:  0    Do not place this medication, or any other prescription from our practice, on "Automatic Refill". Patient may have prescription filled one day early if pharmacy is closed on scheduled refill date.  Marland Kitchen oxyCODONE-acetaminophen (PERCOCET) 5-325 MG tablet    Sig: Take 1-2 tablets by mouth every 8 (eight) hours as needed for severe pain.    Dispense:  75 tablet    Refill:  0    Do not place this medication, or any other prescription from our practice, on "Automatic Refill". Patient may have prescription filled one day early if pharmacy is closed on scheduled refill date.   Orders:  Orders Placed This Encounter  Procedures  . SACROILIAC JOINT INJECTION    Standing Status:   Future    Standing Expiration Date:   12/14/2018    Scheduling Instructions:     Side: Bilateral     Sedation: with     Timeframe: ASAP    Order Specific Question:   Where will this procedure be performed?    Answer:   [facility]   Follow-up plan:   Return for Procedure, Blood Thinner Protocol.     }   Recent Visits Date Type Provider Dept  09/16/18 Office Visit Vevelyn Francois, NP [facility]  Showing recent visits within past 90 days and meeting all other requirements   Today's Visits Date Type Provider Dept  11/13/18 Office Visit Gillis Santa, MD [facility]  Showing today's visits and meeting all other requirements   Future Appointments No visits were found meeting these conditions.  Showing future appointments within next 90 days and meeting all other requirements   I discussed the assessment and treatment plan with the patient. The patient was provided an  opportunity to ask questions and all were answered. The patient agreed with the plan and demonstrated an understanding of the instructions.  Patient advised to call back or  seek an in-person evaluation if the symptoms or condition worsens.  Total duration of non-face-to-face encounter: 25 minutes.  Note by: Gillis Santa, MD Date: 11/13/2018; Time: 9:39 AM  Note: This dictation was prepared with Dragon dictation. Any transcriptional errors that may result from this process are unintentional.  Disclaimer:  * Given the special circumstances of the COVID-19 pandemic, the federal government has announced that the Office for Civil Rights (OCR) will exercise its enforcement discretion and will not impose penalties on physicians using telehealth in the event of noncompliance with regulatory requirements under the [place] (HIPAA) in connection with the good faith provision of telehealth during the NZVJK-82 national public health emergency. ([place])

## 2018-11-26 ENCOUNTER — Other Ambulatory Visit
Admission: RE | Admit: 2018-11-26 | Discharge: 2018-11-26 | Disposition: A | Discharge status: Home / Self Care | Payer: BC Managed Care – PPO | Source: Ambulatory Visit | Attending: Student in an Organized Health Care Education/Training Program | Admitting: Student in an Organized Health Care Education/Training Program

## 2018-11-30 ENCOUNTER — Other Ambulatory Visit: Payer: Self-pay

## 2018-11-30 ENCOUNTER — Emergency Department (HOSPITAL_COMMUNITY)
Admission: EM | Admit: 2018-11-30 | Discharge: 2018-12-01 | Disposition: A | Discharge status: Home / Self Care | Payer: BC Managed Care – PPO | Attending: Emergency Medicine | Admitting: Emergency Medicine

## 2018-11-30 ENCOUNTER — Encounter (HOSPITAL_COMMUNITY): Payer: Self-pay | Admitting: Emergency Medicine

## 2018-11-30 DIAGNOSIS — Z79899 Other long term (current) drug therapy: Secondary | ICD-10-CM | POA: Insufficient documentation

## 2018-11-30 DIAGNOSIS — R1032 Left lower quadrant pain: Secondary | ICD-10-CM | POA: Insufficient documentation

## 2018-11-30 DIAGNOSIS — R109 Unspecified abdominal pain: Secondary | ICD-10-CM

## 2018-11-30 DIAGNOSIS — I10 Essential (primary) hypertension: Secondary | ICD-10-CM | POA: Insufficient documentation

## 2018-11-30 DIAGNOSIS — F17210 Nicotine dependence, cigarettes, uncomplicated: Secondary | ICD-10-CM | POA: Diagnosis not present

## 2018-11-30 NOTE — ED Triage Notes (Signed)
Pt with L sided flank pain that she describes as spasmodic in nature that started around 2100 this evening.

## 2018-12-01 ENCOUNTER — Ambulatory Visit
Payer: BC Managed Care – PPO | Attending: Student in an Organized Health Care Education/Training Program | Admitting: Student in an Organized Health Care Education/Training Program

## 2018-12-01 ENCOUNTER — Emergency Department (HOSPITAL_COMMUNITY): Payer: BC Managed Care – PPO

## 2018-12-01 LAB — CBC WITH DIFFERENTIAL/PLATELET
Abs Immature Granulocytes: 0.03 10*3/uL (ref 0.00–0.07)
Basophils Absolute: 0.1 10*3/uL (ref 0.0–0.1)
Basophils Relative: 1 %
Eosinophils Absolute: 0.1 10*3/uL (ref 0.0–0.5)
Eosinophils Relative: 1 %
HCT: 35.2 % — ABNORMAL LOW (ref 36.0–46.0)
Hemoglobin: 10.1 g/dL — ABNORMAL LOW (ref 12.0–15.0)
Immature Granulocytes: 0 %
Lymphocytes Relative: 35 %
Lymphs Abs: 2.5 10*3/uL (ref 0.7–4.0)
MCH: 24.6 pg — ABNORMAL LOW (ref 26.0–34.0)
MCHC: 28.7 g/dL — ABNORMAL LOW (ref 30.0–36.0)
MCV: 85.6 fL (ref 80.0–100.0)
Monocytes Absolute: 0.7 10*3/uL (ref 0.1–1.0)
Monocytes Relative: 10 %
Neutro Abs: 3.7 10*3/uL (ref 1.7–7.7)
Neutrophils Relative %: 53 %
Platelets: 366 10*3/uL (ref 150–400)
RBC: 4.11 MIL/uL (ref 3.87–5.11)
RDW: 18.9 % — ABNORMAL HIGH (ref 11.5–15.5)
WBC: 7.1 10*3/uL (ref 4.0–10.5)
nRBC: 0 % (ref 0.0–0.2)

## 2018-12-01 LAB — URINALYSIS, ROUTINE W REFLEX MICROSCOPIC
Bilirubin Urine: NEGATIVE
Glucose, UA: NEGATIVE mg/dL
Hgb urine dipstick: NEGATIVE
Ketones, ur: NEGATIVE mg/dL
Leukocytes,Ua: NEGATIVE
Nitrite: NEGATIVE
Protein, ur: NEGATIVE mg/dL
Specific Gravity, Urine: 1.005 (ref 1.005–1.030)
pH: 5 (ref 5.0–8.0)

## 2018-12-01 LAB — BASIC METABOLIC PANEL
Anion gap: 10 (ref 5–15)
BUN: 10 mg/dL (ref 6–20)
CO2: 21 mmol/L — ABNORMAL LOW (ref 22–32)
Calcium: 8.8 mg/dL — ABNORMAL LOW (ref 8.9–10.3)
Chloride: 112 mmol/L — ABNORMAL HIGH (ref 98–111)
Creatinine, Ser: 1.25 mg/dL — ABNORMAL HIGH (ref 0.44–1.00)
GFR calc Af Amer: 58 mL/min — ABNORMAL LOW (ref >60)
GFR calc non Af Amer: 50 mL/min — ABNORMAL LOW (ref >60)
Glucose, Bld: 101 mg/dL — ABNORMAL HIGH (ref 70–99)
Potassium: 3.4 mmol/L — ABNORMAL LOW (ref 3.5–5.1)
Sodium: 143 mmol/L (ref 135–145)

## 2018-12-01 LAB — D-DIMER, QUANTITATIVE (NOT AT ARMC): D-Dimer, Quant: 0.27 ug/mL-FEU (ref 0.00–0.50)

## 2018-12-01 IMAGING — CT CT RENAL STONE PROTOCOL
2 of 4 series · 16 of 46 positions shown, 18 images · non-contrast
Comparison: CT of the abdomen pelvis dated [DATE]

CLINICAL DATA: 47-year-old female with left-sided flank pain.
Concern for kidney stones.

EXAM:
CT ABDOMEN AND PELVIS WITHOUT CONTRAST
TECHNIQUE: Multidetector CT imaging of the abdomen and pelvis was performed
following the standard protocol without IV contrast.

[Series 2: axial st · axial · 0.69mm/px · z∈[+801,+1166]mm · 13 of 83 slices shown, 15 images]
[im 5/83  soft-tissue]
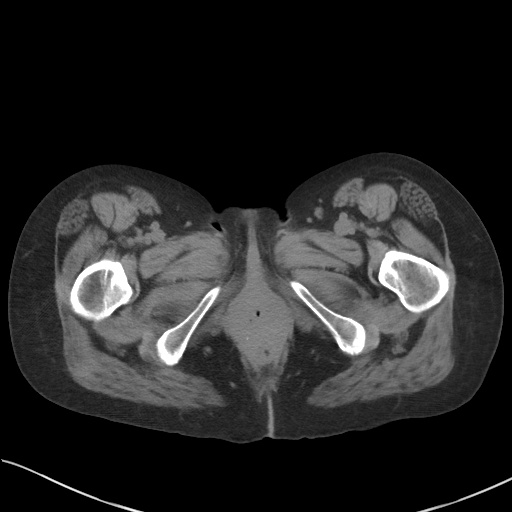
[im 5/83  bone]
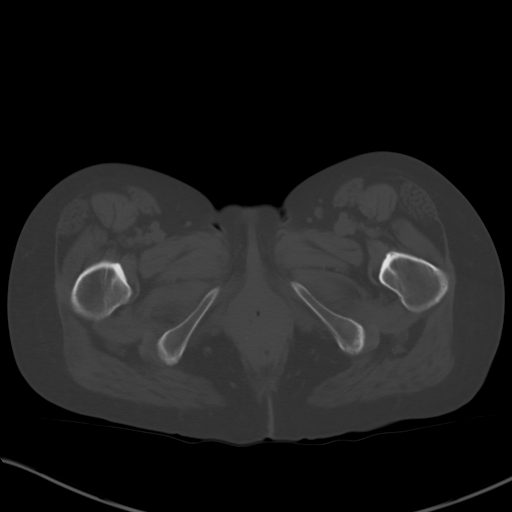
[im 10/83  soft-tissue]
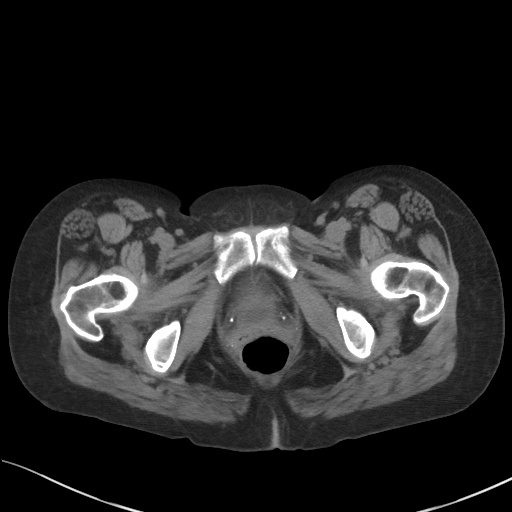
[im 19/83  soft-tissue]
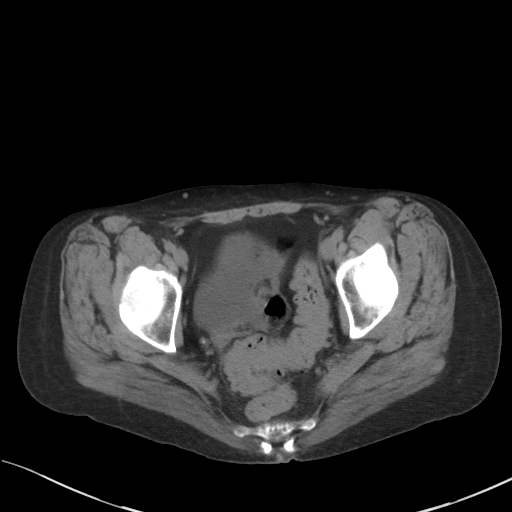
[im 23/83  soft-tissue]
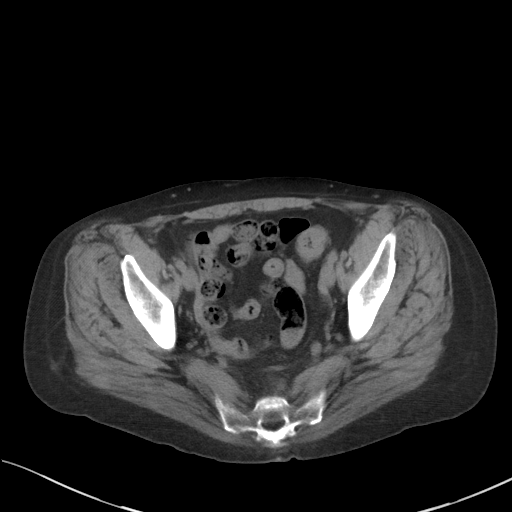
[im 28/83  soft-tissue]
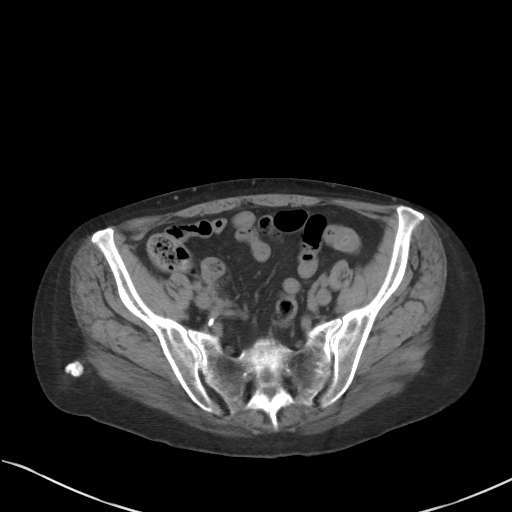
[im 37/83  soft-tissue]
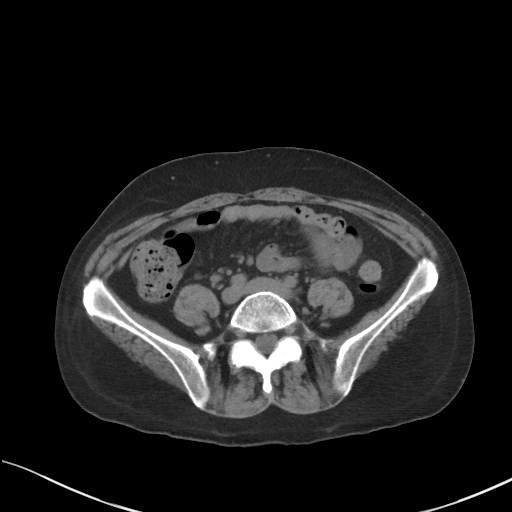
[im 42/83  soft-tissue]
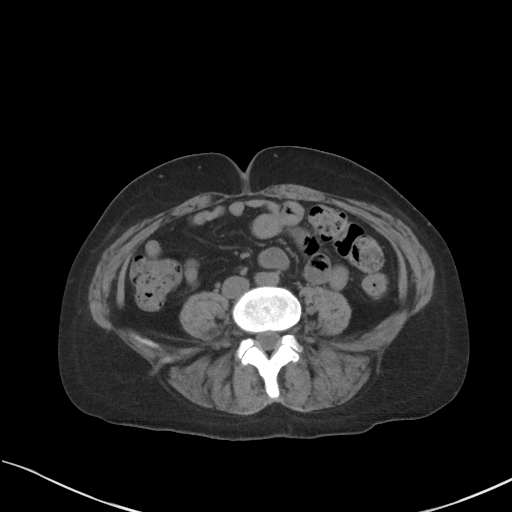
[im 46/83  soft-tissue]
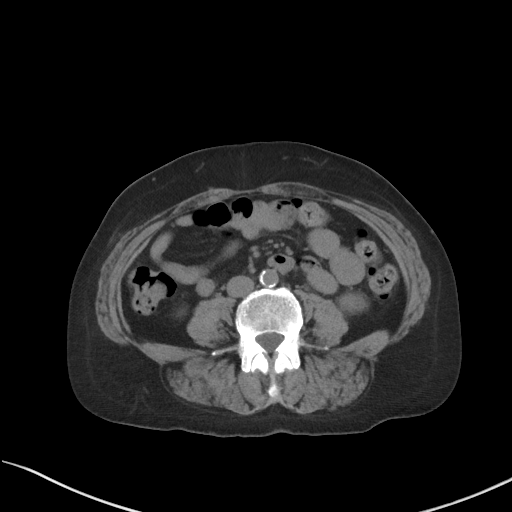
[im 55/83  soft-tissue]
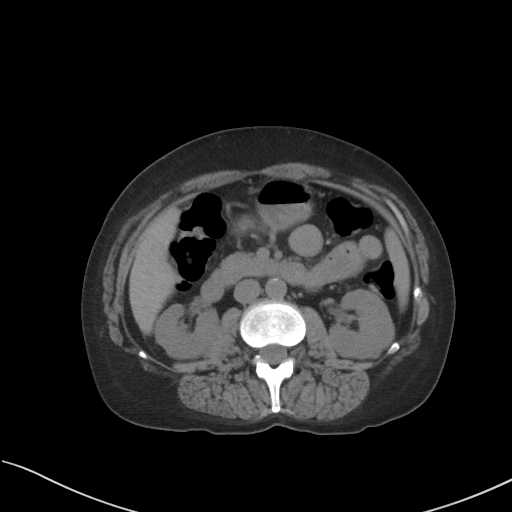
[im 55/83  bone]
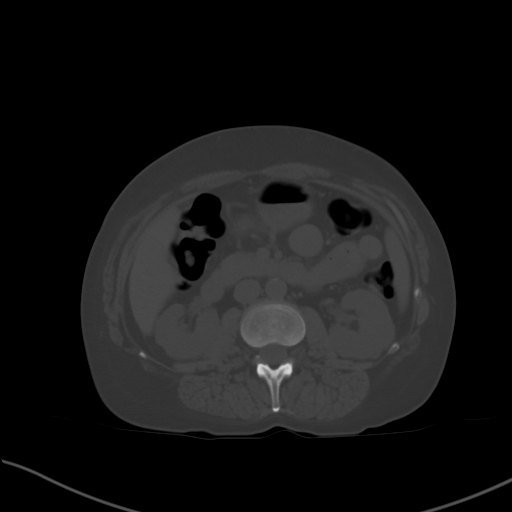
[im 60/83  soft-tissue]
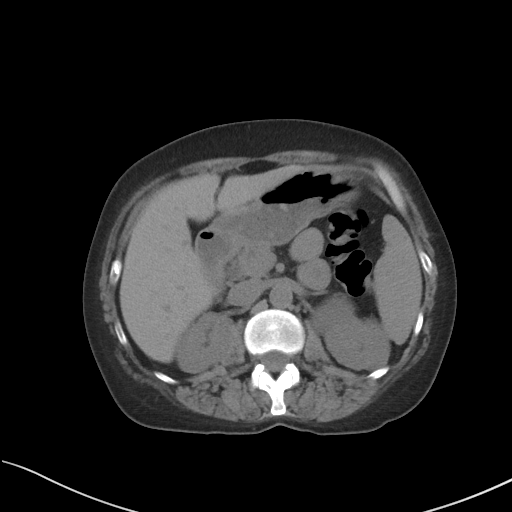
[im 64/83  soft-tissue]
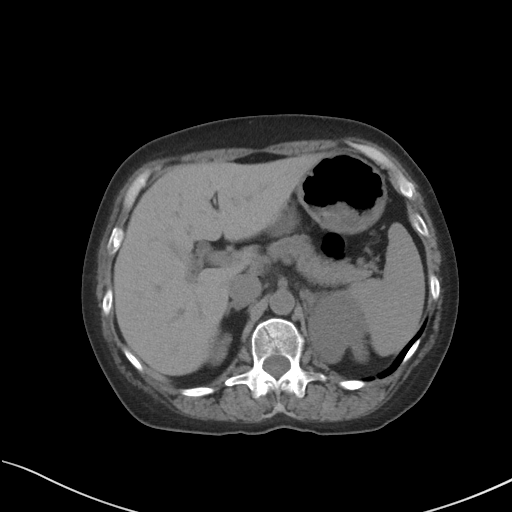
[im 73/83  soft-tissue]
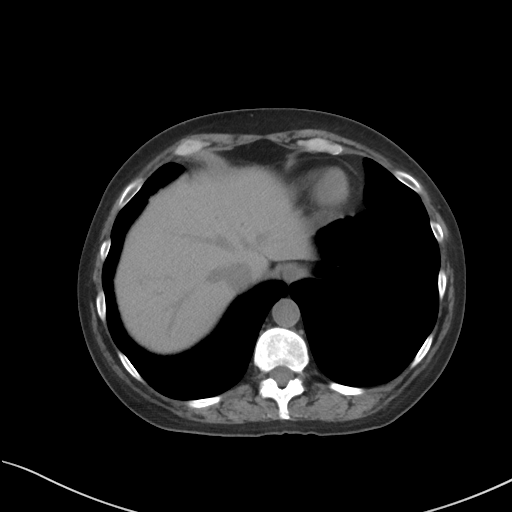
[im 78/83  soft-tissue]
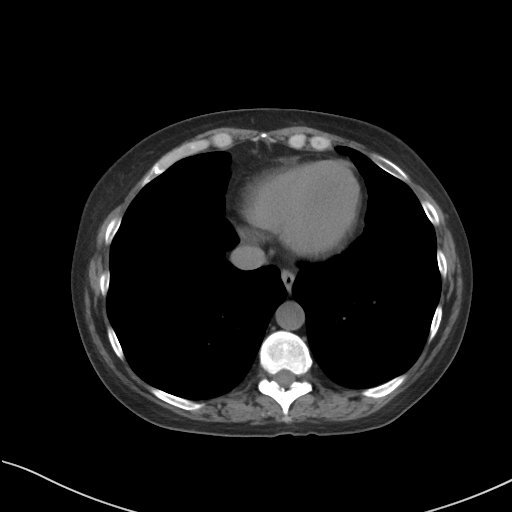

[Series 5: coronal st · coronal · 0.64mm/px · 3 of 82 slices shown]
[im 28/82  soft-tissue]
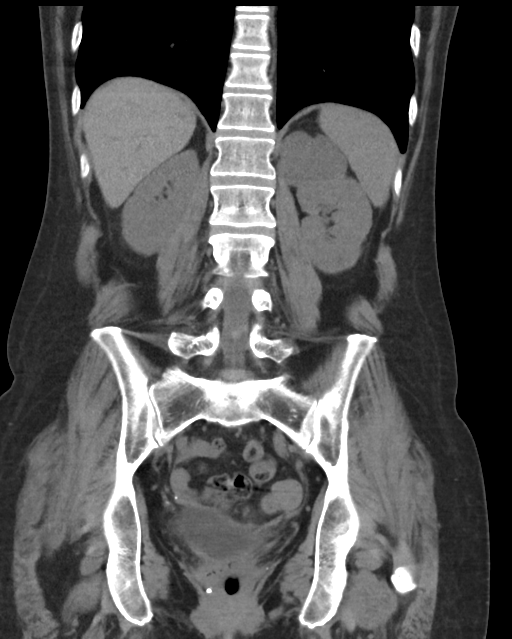
[im 37/82  soft-tissue]
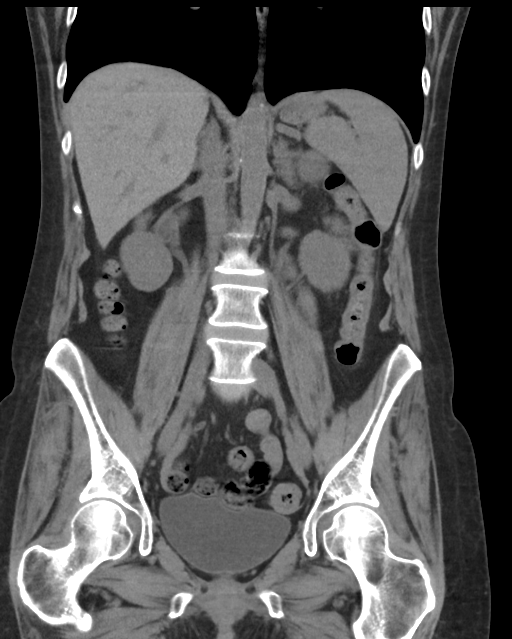
[im 46/82  soft-tissue]
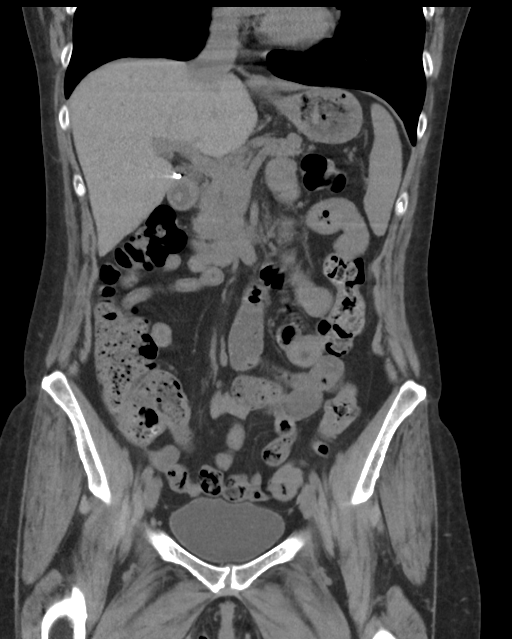

[16 of 46 positions shown; findings below may reference images not displayed]

FINDINGS: Evaluation of this exam is limited in the absence of intravenous
contrast.

Lower chest: The visualized lung bases are clear.

No intra-abdominal free air.  Trace free fluid in the pelvis.

Hepatobiliary: The liver is unremarkable. No intrahepatic biliary
ductal dilatation. Cholecystectomy. No retained calcified stone
noted in the central CBD.

Pancreas: Unremarkable. No pancreatic ductal dilatation or
surrounding inflammatory changes.

Spleen: Normal in size without focal abnormality.

Adrenals/Urinary Tract: The adrenal glands are unremarkable. There
is a 4 cm left renal upper pole hypodense lesion which is
suboptimally characterized but demonstrates fluid attenuation most
consistent with cyst. Additional smaller left renal hypodense lesion
is not characterized. There is no hydronephrosis or nephrolithiasis
on either side. The visualized ureters and urinary bladder are
unremarkable.

Stomach/Bowel: There is no bowel obstruction or active inflammation.
Normal appendix.

Vascular/Lymphatic: Mild aortoiliac atherosclerotic disease. No
portal venous gas. The IVC is grossly unremarkable. There is no
adenopathy.

Reproductive: Hysterectomy. No pelvic mass.

Other: None

Musculoskeletal: No acute or significant osseous findings.
IMPRESSION: 1. No acute intra-abdominal or pelvic pathology. No hydronephrosis
or nephrolithiasis.
2. No bowel obstruction or active inflammation. Normal appendix.
3. Mild aortic Atherosclerosis ([99]-[99]).

## 2018-12-01 MED ORDER — METHOCARBAMOL 500 MG PO TABS
1000.0000 mg | ORAL_TABLET | Freq: Once | ORAL | Status: AC
  Administered 2018-12-01: 1000 mg via ORAL
  Filled 2018-12-01: qty 2

## 2018-12-01 MED ORDER — DIAZEPAM 5 MG PO TABS
5.0000 mg | ORAL_TABLET | Freq: Once | ORAL | Status: AC
  Administered 2018-12-01: 5 mg via ORAL
  Filled 2018-12-01: qty 1

## 2018-12-01 MED ORDER — METHOCARBAMOL 500 MG PO TABS: 500.0000 mg | ORAL_TABLET | Freq: Three times a day (TID) | ORAL | 0 refills | Status: DC | PRN

## 2018-12-01 MED ORDER — HYDROMORPHONE HCL 1 MG/ML IJ SOLN
1.0000 mg | Freq: Once | INTRAMUSCULAR | Status: AC
  Administered 2018-12-01: 1 mg via INTRAVENOUS
  Filled 2018-12-01: qty 1

## 2018-12-01 NOTE — ED Notes (Signed)
Pt ambulatory to bathroom with no distress noted.

## 2018-12-01 NOTE — ED Provider Notes (Signed)
[facility] EMERGENCY DEPARTMENT Provider Note   CSN: [id number] Arrival date & time: 11/30/18  2336     History   Chief Complaint Chief Complaint  Patient presents with  . Flank Pain    HPI [name] is a 48 y.o. female.     Patient with chronic back pain, PE on Eliquis, pain management presenting with left-sided flank pain and spasm since about 9:30 PM.  She reports sharp and stabbing spasming pain that comes and goes but never goes away completely.  This is different than her usual back pain.  She denies any falls or injuries.  There is no radiation of the pain down her leg.  There is no bowel or bladder incontinence.  There is no fever or vomiting.  No pain with urination or blood in the urine.  States she is never had this kind of pain in the past and denies any history of kidney stones.  No cough, fever, chest pain or shortness of breath.  Denies any missed doses after blood thinner.  She is never had a kidney stone before.  She took [facility] powder at home as well as her chronic oxycodone without relief.  The history is provided by the patient.  Flank Pain Pertinent negatives include no chest pain, no abdominal pain, no headaches and no shortness of breath.    Past Medical History:  Diagnosis Date  . Chronic back pain   . Hypertension   . Migraine   . Plumbism    blood clot     Patient Active Problem List   Diagnosis Date Noted  . 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 Long term prescription opiate use 05/19/2018  . Lumbar radiculopathy 02/04/2017  . Lumbar degenerative disc disease 02/04/2017  . Lumbar spondylosis 02/04/2017  . Chronic low back pain with bilateral sciatica 02/04/2017  . Chronic pain syndrome 02/04/2017  . Lumbar sprain 02/04/2017  . Chronic migraine without aura without status migrainosus, not intractable 02/04/2017    Past Surgical History:  Procedure Laterality Date  . ABDOMINAL HYSTERECTOMY    . CHOLECYSTECTOMY    . HAND SURGERY       OB  History    Gravida  2   Para  2   Term  2   Preterm      AB      Living  2     SAB      TAB      Ectopic      Multiple      Live Births               Home Medications    Prior to Admission medications   Medication Sig Start Date End Date Taking? Authorizing Provider  acetaminophen (TYLENOL) 500 MG tablet Take 1,000 mg by mouth every 8 (eight) hours as needed for mild pain, moderate pain, fever or headache.     [provider]  ELIQUIS 5 MG TABS tablet 2 (two) times daily.  01/31/18   [provider]  famotidine (PEPCID) 20 MG tablet Take 20 mg by mouth 2 (two) times daily.    [provider]  metoprolol succinate (TOPROL-XL) 100 MG 24 hr tablet Take 100 mg by mouth daily. Take with or immediately following a meal.    [provider]  oxyCODONE-acetaminophen (PERCOCET) 5-325 MG tablet Take 1-2 tablets by mouth every 8 (eight) hours as needed for severe pain. 11/14/18 12/14/18  Gillis Santa, MD  oxyCODONE-acetaminophen (PERCOCET) 5-325 MG tablet  Take 1-2 tablets by mouth every 8 (eight) hours as needed for severe pain. 12/14/18 01/13/19  Gillis Santa, MD  RaNITidine HCl (ZANTAC PO) Take 1 tablet by mouth 3 (three) times daily.  06/24/07   [provider]  temazepam (RESTORIL) 30 MG capsule Take 30 mg by mouth at bedtime as needed for sleep.  01/17/16   [provider]    Family History Family History  Problem Relation Age of Onset  . Cancer Other   . Seizures Other   . Stroke Other   . Diabetes Other     Social History Social History   Tobacco Use  . Smoking status: Current Every Day Smoker    Packs/day: 1.00    Years: 20.00    Pack years: 20.00    Types: Cigarettes  . Smokeless tobacco: Never Used  Substance Use Topics  . Alcohol use: No  . Drug use: No     Allergies   Buprenorphine, Cefaclor, Gabapentin, Ibuprofen, Levofloxacin, Macrobid [nitrofurantoin monohyd macro], Naproxen, and Penicillins    Review of Systems Review of Systems  Constitutional: Negative for activity change, appetite change and fever.  HENT: Negative for congestion and rhinorrhea.   Eyes: Negative for visual disturbance.  Respiratory: Negative for cough, chest tightness and shortness of breath.   Cardiovascular: Negative for chest pain.  Gastrointestinal: Negative for abdominal pain, nausea and vomiting.  Genitourinary: Positive for flank pain. Negative for dysuria, hematuria and urgency.  Musculoskeletal: Positive for arthralgias, back pain and myalgias.  Skin: Negative for rash.  Neurological: Negative for dizziness, weakness and headaches.    all other systems are negative except as noted in the HPI and PMH.    Physical Exam Updated Vital Signs BP (!) 151/94 (BP Location: Right Arm)   Pulse 72   Resp (!) 22   Ht 5\' 4"  (1.626 m)   Wt 47.6 kg   SpO2 100%   BMI 18.02 kg/m   Physical Exam Vitals signs and nursing note reviewed.  Constitutional:      General: She is not in acute distress.    Appearance: She is well-developed.     Comments: Uncomfortable  HENT:     Head: Normocephalic and atraumatic.     Mouth/Throat:     Pharynx: No oropharyngeal exudate.  Eyes:     Conjunctiva/sclera: Conjunctivae normal.     Pupils: Pupils are equal, round, and reactive to light.  Neck:     Musculoskeletal: Normal range of motion and neck supple.     Comments: No meningismus. Cardiovascular:     Rate and Rhythm: Normal rate and regular rhythm.     Heart sounds: Normal heart sounds. No murmur.  Pulmonary:     Effort: Pulmonary effort is normal. No respiratory distress.     Breath sounds: Normal breath sounds.  Abdominal:     Palpations: Abdomen is soft.     Tenderness: There is no abdominal tenderness. There is no guarding or rebound.  Musculoskeletal: Normal range of motion.        General: Tenderness present.     Comments: Left-sided paraspinal lumbar tenderness and spasm, no midline tenderness 5/5  strength in bilateral lower extremities. Ankle plantar and dorsiflexion intact. Great toe extension intact bilaterally. +2 DP and PT pulses. +2 patellar reflexes bilaterally. Normal gait.   Skin:    General: Skin is warm.     Capillary Refill: Capillary refill takes less than 2 seconds.  Neurological:     General: No focal deficit present.  Mental Status: She is alert and oriented to person, place, and time. Mental status is at baseline.     Cranial Nerves: No cranial nerve deficit.     Motor: No abnormal muscle tone.     Coordination: Coordination normal.     Comments: No ataxia on finger to nose bilaterally. No pronator drift. 5/5 strength throughout. CN 2-12 intact.Equal grip strength. Sensation intact.   Psychiatric:        Behavior: Behavior normal.      ED Treatments / Results  Labs (all labs ordered are listed, but only abnormal results are displayed) Labs Reviewed  URINALYSIS, ROUTINE W REFLEX MICROSCOPIC - Abnormal; Notable for the following components:      Result Value   Color, Urine STRAW (*)    All other components within normal limits  CBC WITH DIFFERENTIAL/PLATELET - Abnormal; Notable for the following components:   Hemoglobin 10.1 (*)    HCT 35.2 (*)    MCH 24.6 (*)    MCHC 28.7 (*)    RDW 18.9 (*)    All other components within normal limits  BASIC METABOLIC PANEL - Abnormal; Notable for the following components:   Potassium 3.4 (*)    Chloride 112 (*)    CO2 21 (*)    Glucose, Bld 101 (*)    Creatinine, Ser 1.25 (*)    Calcium 8.8 (*)    GFR calc non Af Amer 50 (*)    GFR calc Af Amer 58 (*)    All other components within normal limits  D-DIMER, QUANTITATIVE (NOT AT [facility])    EKG None  Radiology Ct Renal Stone Study  Result Date: 12/01/2018 CLINICAL DATA:  48 year old female with left-sided flank pain. Concern for kidney stones. EXAM: CT ABDOMEN AND PELVIS WITHOUT CONTRAST TECHNIQUE: Multidetector CT imaging of the abdomen and pelvis was  performed following the standard protocol without IV contrast. COMPARISON:  CT of the abdomen pelvis dated 03/24/2013 FINDINGS: Evaluation of this exam is limited in the absence of intravenous contrast. Lower chest: The visualized lung bases are clear. No intra-abdominal free air.  Trace free fluid in the pelvis. Hepatobiliary: The liver is unremarkable. No intrahepatic biliary ductal dilatation. Cholecystectomy. No retained calcified stone noted in the central CBD. Pancreas: Unremarkable. No pancreatic ductal dilatation or surrounding inflammatory changes. Spleen: Normal in size without focal abnormality. Adrenals/Urinary Tract: The adrenal glands are unremarkable. There is a 4 cm left renal upper pole hypodense lesion which is suboptimally characterized but demonstrates fluid attenuation most consistent with cyst. Additional smaller left renal hypodense lesion is not characterized. There is no hydronephrosis or nephrolithiasis on either side. The visualized ureters and urinary bladder are unremarkable. Stomach/Bowel: There is no bowel obstruction or active inflammation. Normal appendix. Vascular/Lymphatic: Mild aortoiliac atherosclerotic disease. No portal venous gas. The IVC is grossly unremarkable. There is no adenopathy. Reproductive: Hysterectomy. No pelvic mass. Other: None Musculoskeletal: No acute or significant osseous findings. IMPRESSION: 1. No acute intra-abdominal or pelvic pathology. No hydronephrosis or nephrolithiasis. 2. No bowel obstruction or active inflammation. Normal appendix. 3. Mild aortic Atherosclerosis (ICD10-I70.0). Electronically Signed   By: Anner Crete M.D.   On: 12/01/2018 01:10    Procedures Procedures (including critical care time)  Medications Ordered in ED Medications  HYDROmorphone (DILAUDID) injection 1 mg (has no administration in time range)  diazepam (VALIUM) tablet 5 mg (has no administration in time range)     Initial Impression / Assessment and Plan / ED  Course  I have reviewed the  triage vital signs and the nursing notes.  Pertinent labs & imaging results that were available during my care of the patient were reviewed by me and considered in my medical decision making (see chart for details).       Left flank pain different than usual back pain.  No urinary symptoms.  No focal neurological deficits.  Urinalysis is negative for infection or hematuria.  CT scan shows no kidney stone or other explanation for her pain. No AAA.  She has no neurological deficits.  Low suspicion for cord compression or cauda equina.  States compliance with her Eliquis.  Low suspicion for PE.  D-dimer is negative.  Patient's pain has improved with treatment in the ED.  She understands the ED cannot provide pain medications given her activity with pain management.  Patient resting comfortably on recheck.  She has tolerating p.o. and ambulatory.  Her blood pressure and heart rate have normalized.'  We will treat empirically for suspected muscle spasm.  Discussion may have passed a kidney stone. Discussed with the ED cannot provide chronic pain medications.  She will be given a short course of muscle relaxers and cautioned not to drive or operate heavy machinery with these medications.  Follow-up with your primary doctor and pain specialist.  Return to the ED with worsening symptoms. Final Clinical Impressions(s) / ED Diagnoses   Final diagnoses:  Left flank pain    ED Discharge Orders         Ordered    methocarbamol (ROBAXIN) 500 MG tablet  Every 8 hours PRN     12/01/18 0304           Ezequiel Essex, MD 12/01/18 [phone]

## 2018-12-01 NOTE — Discharge Instructions (Signed)
There is no evidence of kidney stone. You were treated for a muscle spasm. You may have passed a kidney stone.  Take your pain medication as prescribed and followup with your doctor. Return to the ED if you develop new or worsening symptoms.

## 2018-12-01 NOTE — ED Notes (Signed)
Pt ambulated with no distress or difficulty noted at this time.

## 2018-12-31 ENCOUNTER — Telehealth: Payer: Self-pay | Admitting: *Deleted

## 2019-01-05 ENCOUNTER — Telehealth: Payer: Self-pay | Admitting: *Deleted

## 2019-01-05 ENCOUNTER — Encounter: Payer: Self-pay | Admitting: Student in an Organized Health Care Education/Training Program

## 2019-01-05 NOTE — Telephone Encounter (Signed)
Attempted to call several times for pre appointment assessment. Line busy at mobile number, home number rings, no voicemail.

## 2019-01-06 ENCOUNTER — Ambulatory Visit
Payer: BC Managed Care – PPO | Attending: Student in an Organized Health Care Education/Training Program | Admitting: Student in an Organized Health Care Education/Training Program

## 2019-01-06 ENCOUNTER — Encounter: Payer: Self-pay | Admitting: Student in an Organized Health Care Education/Training Program

## 2019-01-06 ENCOUNTER — Other Ambulatory Visit: Payer: Self-pay

## 2019-01-06 DIAGNOSIS — Z79891 Long term (current) use of opiate analgesic: Secondary | ICD-10-CM

## 2019-01-06 DIAGNOSIS — M5136 Other intervertebral disc degeneration, lumbar region: Secondary | ICD-10-CM

## 2019-01-06 DIAGNOSIS — M47816 Spondylosis without myelopathy or radiculopathy, lumbar region: Secondary | ICD-10-CM | POA: Diagnosis not present

## 2019-01-06 DIAGNOSIS — M47818 Spondylosis without myelopathy or radiculopathy, sacral and sacrococcygeal region: Secondary | ICD-10-CM | POA: Diagnosis not present

## 2019-01-06 DIAGNOSIS — G894 Chronic pain syndrome: Secondary | ICD-10-CM | POA: Diagnosis not present

## 2019-01-06 MED ORDER — OXYCODONE-ACETAMINOPHEN 5-325 MG PO TABS: 1.0000 | ORAL_TABLET | Freq: Three times a day (TID) | ORAL | 0 refills | Status: DC | PRN

## 2019-01-06 NOTE — Progress Notes (Signed)
Pain Management Virtual Encounter Note - Virtual Visit via [place] (real-time audio visits between healthcare provider and patient).   Patient's Phone No. & Preferred Pharmacy:  [phone] (home); [phone] (mobile); (Preferred) [phone] No e-mail address on record  [place], [place] - [address] [place] [address] Phone: [phone] Fax: [phone]    Pre-screening note:  Our staff contacted [name] and offered her an "in person", "face-to-face" appointment versus a telephone encounter. She indicated preferring the telephone encounter, at this time.   Reason for Virtual Visit: COVID-19*  Social distancing based on CDC and AMA recommendations.   I contacted [name] on 01/06/2019 via video conference.      I clearly identified myself as Gillis Santa, MD. I verified that I was speaking with the correct person using two identifiers (Name: [name], and date of birth: [birth year]).  Advanced Informed Consent I sought verbal advanced consent from [name] for virtual visit interactions. I informed [name] of possible security and privacy concerns, risks, and limitations associated with providing "not-in-person" medical evaluation and management services. I also informed [name] of the availability of "in-person" appointments. Finally, I informed her that there would be a charge for the virtual visit and that she could be  personally, fully or partially, financially responsible for it. [name] expressed understanding and agreed to proceed.   Historic Elements   [name] is a 48 y.o. year old, female patient evaluated today after her last encounter by our practice on 01/05/2019. [name]  has a past medical history of Chronic back pain, Hypertension, Migraine, and Plumbism. She also  has a past surgical history that includes Abdominal hysterectomy; Cholecystectomy; and Hand  surgery. [name] has a current medication list which includes the following prescription(s): acetaminophen, eliquis, famotidine, methocarbamol, metoprolol succinate, oxycodone-acetaminophen, oxycodone-acetaminophen, temazepam, and ranitidine hcl. She  reports that she has been smoking cigarettes. She has a 20.00 pack-year smoking history. She has never used smokeless tobacco. She reports that she does not drink alcohol or use drugs. [name] is allergic to buprenorphine; cefaclor; gabapentin; ibuprofen; levofloxacin; macrobid [nitrofurantoin monohyd macro]; naproxen; and penicillins.   HPI  Today, she is being contacted for both, medication management and a post-procedure assessment.   Patient was scheduled to have bilateral SI joint injection however had to reschedule due to infusion appointment that she had for low hemoglobin.  She states that she is having increased pain overlying her SI joints and would like to reschedule a repeat SI joint injection.  We will refill her Percocet as below as well.  Patient is requesting an increase in her dose given flank spasms that she is having but I encouraged her to avoid an escalation in opioid medications due to potential side effects.  Pharmacotherapy Assessment  Analgesic: 12/13/2018  1   11/13/2018  Oxycodone-Acetaminophen 5-325  75.00 13 Un Pha   [id number]   Nor (6917)   0  43.27 MME  Comm Ins   [place]    Monitoring: Pharmacotherapy: No side-effects or adverse reactions reported. [place] PMP: PDMP reviewed during this encounter.       Compliance: No problems identified. Effectiveness: Clinically acceptable. Plan: Refer to "POC".  UDS:  Summary  Date Value Ref Range Status  02/18/2018 FINAL  Final    Comment:    ==================================================================== TOXASSURE SELECT 13 (MW) ==================================================================== Test  Result       Flag       Units Drug Present  and Declared for Prescription Verification   Oxazepam                       736          EXPECTED   ng/mg creat   Temazepam                      1561         EXPECTED   ng/mg creat    Oxazepam and temazepam are expected metabolites of diazepam.    Oxazepam is also an expected metabolite of other benzodiazepine    drugs, including chlordiazepoxide, prazepam, clorazepate,    halazepam, and temazepam.  Oxazepam and temazepam are available    as scheduled prescription medications.   Oxycodone                      1555         EXPECTED   ng/mg creat   Oxymorphone                    4264         EXPECTED   ng/mg creat   Noroxycodone                   5839         EXPECTED   ng/mg creat   Noroxymorphone                 2000         EXPECTED   ng/mg creat    Sources of oxycodone are scheduled prescription medications.    Oxymorphone, noroxycodone, and noroxymorphone are expected    metabolites of oxycodone. Oxymorphone is also available as a    scheduled prescription medication. ==================================================================== Test                      Result    Flag   Units      Ref Range   Creatinine              33               mg/dL      >=20 ==================================================================== Declared Medications:  The flagging and interpretation on this report are based on the  following declared medications.  Unexpected results may arise from  inaccuracies in the declared medications.  **Note: The testing scope of this panel includes these medications:  Oxycodone (Oxycodone Acetaminophen)  Temazepam (Restoril)  **Note: The testing scope of this panel does not include following  reported medications:  Acetaminophen  Acetaminophen (Oxycodone Acetaminophen)  Apixaban (Eliquis)  Metoprolol (Toprol)  Ranitidine ==================================================================== For clinical consultation, please call [phone]. ====================================================================    Laboratory Chemistry Profile (12 mo)  Renal: 12/01/2018: BUN 10; Creatinine, Ser 1.25  Lab Results  Component Value Date   GFRAA 58 (L) 12/01/2018   GFRNONAA 50 (L) 12/01/2018   Hepatic: No results found for requested labs within last 8760 hours. Lab Results  Component Value Date   AST 17 03/24/2013   ALT 8 03/24/2013   Other: No results found for requested labs within last 8760 hours. Note: Above Lab results reviewed.  Imaging  Last 90 days:  Ct Renal Stone Study  Result Date: 12/01/2018 CLINICAL DATA:  48 year old female with left-sided flank pain.  Concern for kidney stones. EXAM: CT ABDOMEN AND PELVIS WITHOUT CONTRAST TECHNIQUE: Multidetector CT imaging of the abdomen and pelvis was performed following the standard protocol without IV contrast. COMPARISON:  CT of the abdomen pelvis dated 03/24/2013 FINDINGS: Evaluation of this exam is limited in the absence of intravenous contrast. Lower chest: The visualized lung bases are clear. No intra-abdominal free air.  Trace free fluid in the pelvis. Hepatobiliary: The liver is unremarkable. No intrahepatic biliary ductal dilatation. Cholecystectomy. No retained calcified stone noted in the central CBD. Pancreas: Unremarkable. No pancreatic ductal dilatation or surrounding inflammatory changes. Spleen: Normal in size without focal abnormality. Adrenals/Urinary Tract: The adrenal glands are unremarkable. There is a 4 cm left renal upper pole hypodense lesion which is suboptimally characterized but demonstrates fluid attenuation most consistent with cyst. Additional smaller left renal hypodense lesion is not characterized. There is no hydronephrosis or nephrolithiasis on either side. The visualized ureters and urinary bladder are unremarkable. Stomach/Bowel: There is no bowel obstruction or active inflammation. Normal appendix. Vascular/Lymphatic: Mild aortoiliac  atherosclerotic disease. No portal venous gas. The IVC is grossly unremarkable. There is no adenopathy. Reproductive: Hysterectomy. No pelvic mass. Other: None Musculoskeletal: No acute or significant osseous findings. IMPRESSION: 1. No acute intra-abdominal or pelvic pathology. No hydronephrosis or nephrolithiasis. 2. No bowel obstruction or active inflammation. Normal appendix. 3. Mild aortic Atherosclerosis (ICD10-I70.0). Electronically Signed   By: Anner Crete M.D.   On: 12/01/2018 01:10    Assessment  The primary encounter diagnosis was SI joint arthritis. Diagnoses of Chronic pain syndrome, Lumbar spondylosis, Lumbar degenerative disc disease, and Long term prescription opiate use were also pertinent to this visit.  Plan of Care  I am having [name] start on oxyCODONE-acetaminophen. I am also having her maintain her acetaminophen, metoprolol succinate, RaNITidine HCl (ZANTAC PO), temazepam, Eliquis, famotidine, methocarbamol, and oxyCODONE-acetaminophen.  Pharmacotherapy (Medications Ordered): Meds ordered this encounter  Medications  . oxyCODONE-acetaminophen (PERCOCET) 5-325 MG tablet    Sig: Take 1-2 tablets by mouth every 8 (eight) hours as needed for severe pain.    Dispense:  75 tablet    Refill:  0    Do not place this medication, or any other prescription from our practice, on "Automatic Refill". Patient may have prescription filled one day early if pharmacy is closed on scheduled refill date.  Marland Kitchen oxyCODONE-acetaminophen (PERCOCET) 5-325 MG tablet    Sig: Take 1-2 tablets by mouth every 8 (eight) hours as needed for severe pain.    Dispense:  75 tablet    Refill:  0    Do not place this medication, or any other prescription from our practice, on "Automatic Refill". Patient may have prescription filled one day early if pharmacy is closed on scheduled refill date.   Orders:  Orders Placed This Encounter  Procedures  . SACROILIAC JOINT INJECTION    Standing Status:    Future    Standing Expiration Date:   02/06/2019    Scheduling Instructions:     Side: Bilateral     Sedation: with     Timeframe: ASAP     Stop Eliquis 3 days prior to scheduled procedure    Order Specific Question:   Where will this procedure be performed?    Answer:   [facility]   Follow-up plan:   Return in about 2 weeks (around 01/20/2019) for Procedure B/L SI-J with sedation.    Recent Visits Date Type Provider Dept  11/13/18 Office Visit Gillis Santa, MD [facility]  Showing recent visits within past 90 days and meeting all other requirements   Today's Visits Date Type Provider Dept  01/06/19 Office Visit Gillis Santa, MD [facility]  Showing today's visits and meeting all other requirements   Future Appointments No visits were found meeting these conditions.  Showing future appointments within next 90 days and meeting all other requirements   I discussed the assessment and treatment plan with the patient. The patient was provided an opportunity to ask questions and all were answered. The patient agreed with the plan and demonstrated an understanding of the instructions.  Patient advised to call back or seek an in-person evaluation if the symptoms or condition worsens.  Total duration of non-face-to-face encounter: 25 minutes.  Note by: Gillis Santa, MD Date: 01/06/2019; Time: 2:50 PM  Note: This dictation was prepared with Dragon dictation. Any transcriptional errors that may result from this process are unintentional.  Disclaimer:  * Given the special circumstances of the COVID-19 pandemic, the federal government has announced that the Office for Civil Rights (OCR) will exercise its enforcement discretion and will not impose penalties on physicians using telehealth in the event of noncompliance with regulatory requirements under the [place] and [place] (HIPAA) in connection with the good faith provision of  telehealth during the XX123456 national public health emergency. ([place])

## 2019-01-14 ENCOUNTER — Ambulatory Visit: Payer: BC Managed Care – PPO | Admitting: Student in an Organized Health Care Education/Training Program

## 2019-02-02 ENCOUNTER — Ambulatory Visit
Admission: RE | Admit: 2019-02-02 | Discharge: 2019-02-02 | Disposition: A | Discharge status: Home / Self Care | Payer: BC Managed Care – PPO | Source: Ambulatory Visit | Attending: Student in an Organized Health Care Education/Training Program | Admitting: Student in an Organized Health Care Education/Training Program

## 2019-02-02 ENCOUNTER — Encounter: Payer: Self-pay | Admitting: Student in an Organized Health Care Education/Training Program

## 2019-02-02 ENCOUNTER — Other Ambulatory Visit: Payer: Self-pay

## 2019-02-02 ENCOUNTER — Ambulatory Visit (HOSPITAL_BASED_OUTPATIENT_CLINIC_OR_DEPARTMENT_OTHER): Payer: BC Managed Care – PPO | Admitting: Student in an Organized Health Care Education/Training Program

## 2019-02-02 VITALS — BP 167/68 | HR 71 | Temp 96.2°F | Resp 18 | Ht 61.0 in | Wt 105.0 lb

## 2019-02-02 DIAGNOSIS — M47818 Spondylosis without myelopathy or radiculopathy, sacral and sacrococcygeal region: Secondary | ICD-10-CM

## 2019-02-02 MED ORDER — DEXAMETHASONE SODIUM PHOSPHATE 10 MG/ML IJ SOLN
10.0000 mg | Freq: Once | INTRAMUSCULAR | Status: AC
  Administered 2019-02-02: 10 mg

## 2019-02-02 MED ORDER — LIDOCAINE HCL 2 % IJ SOLN
20.0000 mL | Freq: Once | INTRAMUSCULAR | Status: AC
  Administered 2019-02-02: 400 mg
  Filled 2019-02-02: qty 20

## 2019-02-02 MED ORDER — ROPIVACAINE HCL 2 MG/ML IJ SOLN
1.0000 mL | Freq: Once | INTRAMUSCULAR | Status: AC
  Administered 2019-02-02: 10 mL via EPIDURAL
  Filled 2019-02-02: qty 10

## 2019-02-02 MED ORDER — ROPIVACAINE HCL 2 MG/ML IJ SOLN
2.0000 mL | Freq: Once | INTRAMUSCULAR | Status: AC
  Administered 2019-02-02: 10:00:00 10 mL via EPIDURAL

## 2019-02-02 MED ORDER — ROPIVACAINE HCL 2 MG/ML IJ SOLN
INTRAMUSCULAR | Status: AC
  Filled 2019-02-02: qty 20

## 2019-02-02 MED ORDER — DEXAMETHASONE SODIUM PHOSPHATE 10 MG/ML IJ SOLN
10.0000 mg | Freq: Once | INTRAMUSCULAR | Status: AC
  Administered 2019-02-02: 10 mg
  Filled 2019-02-02: qty 1

## 2019-02-02 MED ORDER — IOHEXOL 180 MG/ML  SOLN
10.0000 mL | Freq: Once | INTRAMUSCULAR | Status: AC
  Administered 2019-02-02: 10 mL via EPIDURAL

## 2019-02-02 MED ORDER — FENTANYL CITRATE (PF) 100 MCG/2ML IJ SOLN
25.0000 ug | INTRAMUSCULAR | Status: DC | PRN
  Administered 2019-02-02: 100 ug via INTRAVENOUS
  Filled 2019-02-02: qty 2

## 2019-02-02 NOTE — Patient Instructions (Signed)

## 2019-02-02 NOTE — Progress Notes (Signed)
Patient's Name: [name]  MRN: [id number]  Referring Provider: [facility]  DOB: [birth year]  PCP: [facility]  DOS: 02/02/2019  Note by: Gillis Santa, MD  Service setting: Ambulatory outpatient  Specialty: Interventional Pain Management  Patient type: Established  Location: [facility] (AMB) Pain Management Facility  Visit type: Interventional Procedure   Primary Reason for Visit: Interventional Pain Management Treatment. CC: Back Pain  Procedure:  Anesthesia, Analgesia, Anxiolysis:  Type: Therapeutic Sacroiliac Joint Steroid Injection #3  Region: Inferior Lumbosacral Region Level: PIIS (Posterior Inferior Iliac Spine) Laterality: Bilateral  Type: Moderate (Conscious) Sedation combined with Local Anesthesia Indication(s): Analgesia and Anxiety Route: Intravenous (IV) IV Access: Secured Sedation: Meaningful verbal contact was maintained at all times during the procedure  Local Anesthetic: Lidocaine 1%   Indications: 1. SI joint arthritis    Pain Score: Pre-procedure: 7 /10 Post-procedure: 0-No pain/10  Patient stopped Eliquis 3 days prior to scheduled procedure  Pre-op Assessment:  [name] is a 48 y.o. (year old), female patient, seen today for interventional treatment. She  has a past surgical history that includes Abdominal hysterectomy; Cholecystectomy; and Hand surgery. [name] has a current medication list which includes the following prescription(s): acetaminophen, eliquis, famotidine, metoprolol succinate, oxycodone-acetaminophen, oxycodone-acetaminophen, temazepam, methocarbamol, and ranitidine hcl, and the following Facility-Administered Medications: fentanyl. Her primarily concern today is the Back Pain  Initial Vital Signs:  Pulse/HCG Rate: 71ECG Heart Rate: 71 Temp: 98 F (36.7 C) Resp: 11 BP: (!) 152/62 SpO2: 100 %  BMI: Estimated body mass index is 19.84 kg/m as calculated from the following:   Height as of this  encounter: 5\' 1"  (1.549 m).   Weight as of this encounter: 105 lb (47.6 kg).  Risk Assessment: Allergies: Reviewed. She is allergic to buprenorphine; cefaclor; gabapentin; ibuprofen; levofloxacin; macrobid [nitrofurantoin monohyd macro]; naproxen; and penicillins.  Allergy Precautions: None required Coagulopathies: Reviewed. None identified.  Blood-thinner therapy: None at this time Active Infection(s): Reviewed. None identified. [name] is afebrile  Site Confirmation: [name] was asked to confirm the procedure and laterality before marking the site Procedure checklist: Completed Consent: Before the procedure and under the influence of no sedative(s), amnesic(s), or anxiolytics, the patient was informed of the treatment options, risks and possible complications. To fulfill our ethical and legal obligations, as recommended by the American Medical Association's Code of Ethics, I have informed the patient of my clinical impression; the nature and purpose of the treatment or procedure; the risks, benefits, and possible complications of the intervention; the alternatives, including doing nothing; the risk(s) and benefit(s) of the alternative treatment(s) or procedure(s); and the risk(s) and benefit(s) of doing nothing. The patient was provided information about the general risks and possible complications associated with the procedure. These may include, but are not limited to: failure to achieve desired goals, infection, bleeding, organ or nerve damage, allergic reactions, paralysis, and death. In addition, the patient was informed of those risks and complications associated to the procedure, such as failure to decrease pain; infection; bleeding; organ or nerve damage with subsequent damage to sensory, motor, and/or autonomic systems, resulting in permanent pain, numbness, and/or weakness of one or several areas of the body; allergic reactions; (i.e.: anaphylactic reaction); and/or death. Furthermore,  the patient was informed of those risks and complications associated with the medications. These include, but are not limited to: allergic reactions (i.e.: anaphylactic or anaphylactoid reaction(s)); adrenal axis suppression; blood sugar elevation that in diabetics may result in ketoacidosis or comma; water  retention that in patients with history of congestive heart failure may result in shortness of breath, pulmonary edema, and decompensation with resultant heart failure; weight gain; swelling or edema; medication-induced neural toxicity; particulate matter embolism and blood vessel occlusion with resultant organ, and/or nervous system infarction; and/or aseptic necrosis of one or more joints. Finally, the patient was informed that Medicine is not an exact science; therefore, there is also the possibility of unforeseen or unpredictable risks and/or possible complications that may result in a catastrophic outcome. The patient indicated having understood very clearly. We have given the patient no guarantees and we have made no promises. Enough time was given to the patient to ask questions, all of which were answered to the patient's satisfaction. [name] has indicated that she wanted to continue with the procedure. Attestation: I, the ordering provider, attest that I have discussed with the patient the benefits, risks, side-effects, alternatives, likelihood of achieving goals, and potential problems during recovery for the procedure that I have provided informed consent. Date  Time: 02/02/2019  9:16 AM  Pre-Procedure Preparation:  Monitoring: As per clinic protocol. Respiration, ETCO2, SpO2, BP, heart rate and rhythm monitor placed and checked for adequate function Safety Precautions: Patient was assessed for positional comfort and pressure points before starting the procedure. Time-out: I initiated and conducted the "Time-out" before starting the procedure, as per protocol. The patient was asked to  participate by confirming the accuracy of the "Time Out" information. Verification of the correct person, site, and procedure were performed and confirmed by me, the nursing staff, and the patient. "Time-out" conducted as per Joint Commission's Universal Protocol (UP.01.01.01). Time: 1016  Description of Procedure:       Position: Prone Target Area: Inferior, posterior, aspect of the sacroiliac fissure Approach: Posterior, paraspinal, ipsilateral approach. Area Prepped: Entire Lower Lumbosacral Region Prepping solution: ChloraPrep (2% chlorhexidine gluconate and 70% isopropyl alcohol) Safety Precautions: Aspiration looking for blood return was conducted prior to all injections. At no point did we inject any substances, as a needle was being advanced. No attempts were made at seeking any paresthesias. Safe injection practices and needle disposal techniques used. Medications properly checked for expiration dates. SDV (single dose vial) medications used. Description of the Procedure: Protocol guidelines were followed. The patient was placed in position over the procedure table. The target area was identified and the area prepped in the usual manner. Skin & deeper tissues infiltrated with local anesthetic. Appropriate amount of time allowed to pass for local anesthetics to take effect. The procedure needle was advanced under fluoroscopic guidance into the sacroiliac joint until a firm endpoint was obtained. Proper needle placement secured. Negative aspiration confirmed. Solution injected in intermittent fashion, asking for systemic symptoms every 0.5cc of injectate. The needles were then removed and the area cleansed, making sure to leave some of the prepping solution back to take advantage of its long term bactericidal properties. Vitals:   02/02/19 1027 02/02/19 1034 02/02/19 1044 02/02/19 1054  BP: (!) 173/84 (!) 170/55 (!) 171/95 (!) 167/68  Pulse:      Resp: 10 16 17 18   Temp:  (!) 95.4 F (35.2 C)   (!) 96.2 F (35.7 C)  SpO2: 100% 100% 100% 97%  Weight:      Height:        Start Time: 1016 hrs. End Time: 1027 hrs. Materials:  Needle(s) Type: Regular needle Gauge: 22G Length: 3.5-in Medication(s): Please see orders for medications and dosing details. 10 cc solution made of 8 cc of  0.2% ropivacaine, 2 cc of Decadron 10 mg/cc.  2.5 cc injected intra-articular, 2.5 cc injected periarticular on each side. Imaging Guidance (Non-Spinal):  Type of Imaging Technique: Fluoroscopy Guidance (Non-Spinal) Indication(s): Assistance in needle guidance and placement for procedures requiring needle placement in or near specific anatomical locations not easily accessible without such assistance. Exposure Time: Please see nurses notes. Contrast: Before injecting any contrast, we confirmed that the patient did not have an allergy to iodine, shellfish, or radiological contrast. Once satisfactory needle placement was completed at the desired level, radiological contrast was injected. Contrast injected under live fluoroscopy. No contrast complications. See chart for type and volume of contrast used. Fluoroscopic Guidance: I was personally present during the use of fluoroscopy. "Tunnel Vision Technique" used to obtain the best possible view of the target area. Parallax error corrected before commencing the procedure. "Direction-depth-direction" technique used to introduce the needle under continuous pulsed fluoroscopy. Once target was reached, antero-posterior, oblique, and lateral fluoroscopic projection used confirm needle placement in all planes. Images permanently stored in EMR. Interpretation: I personally interpreted the imaging intraoperatively. Adequate needle placement confirmed in multiple planes. Appropriate spread of contrast into desired area was observed. No evidence of afferent or efferent intravascular uptake. Permanent images saved into the patient's record.  Antibiotic Prophylaxis:    Anti-infectives (From admission, onward)   None     Indication(s): None identified  Post-operative Assessment:  Post-procedure Vital Signs:  Pulse/HCG Rate: 7168 Temp: (!) 96.2 F (35.7 C) Resp: 18 BP: (!) 167/68 SpO2: 97 %  EBL: None  Complications: No immediate post-treatment complications observed by team, or reported by patient.  Note: The patient tolerated the entire procedure well. A repeat set of vitals were taken after the procedure and the patient was kept under observation following institutional policy, for this type of procedure. Post-procedural neurological assessment was performed, showing return to baseline, prior to discharge. The patient was provided with post-procedure discharge instructions, including a section on how to identify potential problems. Should any problems arise concerning this procedure, the patient was given instructions to immediately contact us, at any time, without hesitation. In any case, we plan to contact the patient by telephone for a follow-up status report regarding this interventional procedure.  Comments:  No additional relevant information. 5 out of 5 strength bilateral lower extremity: Plantar flexion, dorsiflexion, knee flexion, knee extension.  Plan of Care  Patient instructed to resume Eliquis tomorrow.  Imaging Orders     DG PAIN CLINIC C-ARM 1-60 MIN NO REPORT Procedure Orders    No procedure(s) ordered today    Medications ordered for procedure: Meds ordered this encounter  Medications  . iohexol (OMNIPAQUE) 180 MG/ML injection 10 mL    Must be Myelogram-compatible. If not available, you may substitute with a water-soluble, non-ionic, hypoallergenic, myelogram-compatible radiological contrast medium.  Marland Kitchen lidocaine (XYLOCAINE) 2 % (with pres) injection 400 mg  . fentaNYL (SUBLIMAZE) injection 25-50 mcg    Make sure Narcan is available in the pyxis when using this medication. In the event of respiratory depression (RR<  8/min): Titrate NARCAN (naloxone) in increments of 0.1 to 0.2 mg IV at 2-3 minute intervals, until desired degree of reversal.  . dexamethasone (DECADRON) injection 10 mg  . ropivacaine (PF) 2 mg/mL (0.2%) (NAROPIN) injection 1 mL  . dexamethasone (DECADRON) injection 10 mg  . ropivacaine (PF) 2 mg/mL (0.2%) (NAROPIN) injection 2 mL   Medications administered: We administered iohexol, lidocaine, fentaNYL, dexamethasone, ropivacaine (PF) 2 mg/mL (0.2%), dexamethasone, and ropivacaine (PF) 2 mg/mL (  0.2%).  See the medical record for exact dosing, route, and time of administration.  New Prescriptions   No medications on file   Disposition: Discharge home  Discharge Date & Time: 02/02/2019; 1055 hrs.   Physician-requested Follow-up: Return for Keep sch. appt.  Future Appointments  Date Time Provider [place]  03/12/2019 10:30 AM Gillis Santa, MD [facility] None   Primary Care Physician: [facility] Location: [facility] Outpatient [facility] Management Facility Note by: Gillis Santa, MD Date: 02/02/2019; Time: 1:15 PM  Disclaimer:  Medicine is not an exact science. The only guarantee in medicine is that nothing is guaranteed. It is important to note that the decision to proceed with this intervention was based on the information collected from the patient. The Data and conclusions were drawn from the patient's questionnaire, the interview, and the physical examination. Because the information was provided in large part by the patient, it cannot be guaranteed that it has not been purposely or unconsciously manipulated. Every effort has been made to obtain as much relevant data as possible for this evaluation. It is important to note that the conclusions that lead to this procedure are derived in large part from the available data. Always take into account that the treatment will also be dependent on availability of resources and existing treatment guidelines, considered by  other Pain Management Practitioners as being common knowledge and practice, at the time of the intervention. For Medico-Legal purposes, it is also important to point out that variation in procedural techniques and pharmacological choices are the acceptable norm. The indications, contraindications, technique, and results of the above procedure should only be interpreted and judged by a Board-Certified Interventional Pain Specialist with extensive familiarity and expertise in the same exact procedure and technique.

## 2019-02-03 ENCOUNTER — Telehealth: Payer: Self-pay

## 2019-02-03 NOTE — Telephone Encounter (Signed)
Post procedure phone call.  Patient states she is doing well.  

## 2019-02-04 ENCOUNTER — Telehealth: Payer: Self-pay | Admitting: Student in an Organized Health Care Education/Training Program

## 2019-02-04 NOTE — Telephone Encounter (Signed)
Attempted to call patient about her concern. No answer  No voicemail set up to leave message.

## 2019-02-04 NOTE — Telephone Encounter (Signed)
On Tues, face neck and chest turned very red and had headache all day. Would like to speak with Nurse about this. She had procedure on Monday

## 2019-03-05 ENCOUNTER — Encounter: Payer: BC Managed Care – PPO | Admitting: Student in an Organized Health Care Education/Training Program

## 2019-03-11 ENCOUNTER — Encounter: Payer: Self-pay | Admitting: Student in an Organized Health Care Education/Training Program

## 2019-03-12 ENCOUNTER — Encounter: Payer: Self-pay | Admitting: Student in an Organized Health Care Education/Training Program

## 2019-03-12 ENCOUNTER — Other Ambulatory Visit: Payer: Self-pay

## 2019-03-12 ENCOUNTER — Ambulatory Visit
Payer: BC Managed Care – PPO | Attending: Student in an Organized Health Care Education/Training Program | Admitting: Student in an Organized Health Care Education/Training Program

## 2019-03-12 DIAGNOSIS — Z79891 Long term (current) use of opiate analgesic: Secondary | ICD-10-CM | POA: Diagnosis not present

## 2019-03-12 DIAGNOSIS — M47816 Spondylosis without myelopathy or radiculopathy, lumbar region: Secondary | ICD-10-CM | POA: Diagnosis not present

## 2019-03-12 DIAGNOSIS — M5441 Lumbago with sciatica, right side: Secondary | ICD-10-CM

## 2019-03-12 DIAGNOSIS — G894 Chronic pain syndrome: Secondary | ICD-10-CM

## 2019-03-12 DIAGNOSIS — M5136 Other intervertebral disc degeneration, lumbar region: Secondary | ICD-10-CM | POA: Diagnosis not present

## 2019-03-12 DIAGNOSIS — G8929 Other chronic pain: Secondary | ICD-10-CM

## 2019-03-12 DIAGNOSIS — M5442 Lumbago with sciatica, left side: Secondary | ICD-10-CM

## 2019-03-12 DIAGNOSIS — M47818 Spondylosis without myelopathy or radiculopathy, sacral and sacrococcygeal region: Secondary | ICD-10-CM

## 2019-03-12 DIAGNOSIS — M5416 Radiculopathy, lumbar region: Secondary | ICD-10-CM

## 2019-03-12 MED ORDER — OXYCODONE-ACETAMINOPHEN 5-325 MG PO TABS: 1.0000 | ORAL_TABLET | Freq: Three times a day (TID) | ORAL | 0 refills | Status: DC | PRN

## 2019-03-12 NOTE — Progress Notes (Signed)
Pain Management Virtual Encounter Note - Virtual Visit via Telephone Telehealth (real-time audio visits between healthcare provider and patient).   Patient's Phone No. & Preferred Pharmacy:  [phone] (home); There is no such number on file (mobile).; (Preferred) [phone] No e-mail address on record  [place], [place] - [address] [place] [address] Phone: [phone] Fax: [phone]    Pre-screening note:  Our staff contacted [name] and offered [name] an "in person", "face-to-face" appointment versus a telephone encounter. She indicated preferring the telephone encounter, at this time.   Reason for Virtual Visit: COVID-19*  Social distancing based on CDC and AMA recommendations.   I contacted [name] on 03/12/2019 via telephone.      I clearly identified myself as Gillis Santa, MD. I verified that I was speaking with the correct person using two identifiers (Name: [name], and date of birth: 06-Jun-1970).  Advanced Informed Consent I sought verbal advanced consent from [name] for virtual visit interactions. I informed [name] of possible security and privacy concerns, risks, and limitations associated with providing "not-in-person" medical evaluation and management services. I also informed [name] of the availability of "in-person" appointments. Finally, I informed [name] that there would be a charge for the virtual visit and that she could be  personally, fully or partially, financially responsible for it. [name] expressed understanding and agreed to proceed.   Historic Elements   [name] is a 48 y.o. year old, female patient evaluated today after [name] last encounter by our practice on 02/04/2019. [name]  has a past medical history of Chronic back pain, Hypertension, Migraine, and Plumbism. She also  has a past surgical history that includes Abdominal hysterectomy; Cholecystectomy; and  Hand surgery. [name] has a current medication list which includes the following prescription(s): acetaminophen, eliquis, famotidine, metoprolol succinate, oxycodone-acetaminophen, oxycodone-acetaminophen, oxycodone-acetaminophen, temazepam, and ranitidine hcl. She  reports that she has been smoking cigarettes. She has a 20.00 pack-year smoking history. She has never used smokeless tobacco. She reports that she does not drink alcohol or use drugs. [name] is allergic to buprenorphine; cefaclor; gabapentin; ibuprofen; levofloxacin; macrobid [nitrofurantoin monohyd macro]; naproxen; and penicillins.   HPI  Today, she is being contacted for both, medication management and a post-procedure assessment.   Patient is experiencing appropriate pain relief from [name] previous SI joint injection.  This was performed 02/02/2019.  She rates ongoing pain relief is approximately 50 to 60% in the area of [name] SI joint.   Patient experienced skin discoloration after [name] procedure.  Unclear why this happened as the patient previously has had SI joint injections without such a skin reaction.  We will continue to monitor.  We will refill patient's Percocet as below.  Instructed patient that she will need to come in for annual urine toxicology screen for medication compliance monitoring.  Pharmacotherapy Assessment  Analgesic:  02/11/2019  1   01/06/2019  Oxycodone-Acetaminophen 5-325  75.00  13 Un Pha   [id number]   Nor (6917)   0  43.27 MME  Comm Ins   [place]    Monitoring: Pharmacotherapy: No side-effects or adverse reactions reported. [place] PMP: PDMP reviewed during this encounter.       Compliance: No problems identified. Effectiveness: Clinically acceptable. Plan: Refer to "POC".  UDS:  Summary  Date Value Ref Range Status  02/18/2018 FINAL  Final    Comment:    ==================================================================== TOXASSURE SELECT 13  (  MW) ==================================================================== Test                             Result       Flag       Units Drug Present and Declared for Prescription Verification   Oxazepam                       736          EXPECTED   ng/mg creat   Temazepam                      1561         EXPECTED   ng/mg creat    Oxazepam and temazepam are expected metabolites of diazepam.    Oxazepam is also an expected metabolite of other benzodiazepine    drugs, including chlordiazepoxide, prazepam, clorazepate,    halazepam, and temazepam.  Oxazepam and temazepam are available    as scheduled prescription medications.   Oxycodone                      1555         EXPECTED   ng/mg creat   Oxymorphone                    4264         EXPECTED   ng/mg creat   Noroxycodone                   5839         EXPECTED   ng/mg creat   Noroxymorphone                 2000         EXPECTED   ng/mg creat    Sources of oxycodone are scheduled prescription medications.    Oxymorphone, noroxycodone, and noroxymorphone are expected    metabolites of oxycodone. Oxymorphone is also available as a    scheduled prescription medication. ==================================================================== Test                      Result    Flag   Units      Ref Range   Creatinine              33               mg/dL      >=20 ==================================================================== Declared Medications:  The flagging and interpretation on this report are based on the  following declared medications.  Unexpected results may arise from  inaccuracies in the declared medications.  **Note: The testing scope of this panel includes these medications:  Oxycodone (Oxycodone Acetaminophen)  Temazepam (Restoril)  **Note: The testing scope of this panel does not include following  reported medications:  Acetaminophen  Acetaminophen (Oxycodone Acetaminophen)  Apixaban (Eliquis)  Metoprolol (Toprol)   Ranitidine ==================================================================== For clinical consultation, please call [phone]. ====================================================================    Laboratory Chemistry Profile (12 mo)  Renal: 12/01/2018: BUN 10; Creatinine, Ser 1.25  Lab Results  Component Value Date   GFRAA 58 (L) 12/01/2018   GFRNONAA 50 (L) 12/01/2018   Hepatic: No results found for requested labs within last 8760 hours. Lab Results  Component Value Date   AST 17 03/24/2013   ALT 8 03/24/2013   Other: No results found for requested labs within last 8760  hours. Note: Above Lab results reviewed.  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 Diagnoses of Lumbar spondylosis, Lumbar degenerative disc disease, Long term prescription opiate use, Chronic bilateral low back pain with bilateral sciatica, Lumbar radiculopathy, and Chronic pain syndrome were also pertinent to this visit.  Plan of Care  I have discontinued Maggie J. [name]'s methocarbamol. I am also having [name] start on oxyCODONE-acetaminophen and oxyCODONE-acetaminophen. Additionally, I am having [name] maintain [name] acetaminophen, metoprolol succinate, RaNITidine HCl (ZANTAC PO), temazepam, Eliquis, famotidine, and oxyCODONE-acetaminophen.  Pharmacotherapy (Medications Ordered): Meds ordered this encounter  Medications  . oxyCODONE-acetaminophen (PERCOCET) 5-325 MG tablet    Sig: Take 1-2 tablets by mouth every 8 (eight) hours as needed for severe pain.    Dispense:  75 tablet    Refill:  0    Do not place this medication, or any other prescription from our practice, on "Automatic Refill". Patient may have prescription filled one day early if pharmacy is closed on scheduled refill date.  Marland Kitchen oxyCODONE-acetaminophen (PERCOCET) 5-325 MG tablet    Sig: Take 1-2 tablets by mouth every 8 (eight) hours as needed for severe pain.    Dispense:  75 tablet    Refill:  0    Do not place  this medication, or any other prescription from our practice, on "Automatic Refill". Patient may have prescription filled one day early if pharmacy is closed on scheduled refill date.  Marland Kitchen oxyCODONE-acetaminophen (PERCOCET) 5-325 MG tablet    Sig: Take 1-2 tablets by mouth every 8 (eight) hours as needed for severe pain.    Dispense:  75 tablet    Refill:  0    Do not place this medication, or any other prescription from our practice, on "Automatic Refill". Patient may have prescription filled one day early if pharmacy is closed on scheduled refill date.   Orders:  Orders Placed This Encounter  Procedures  . SACROILIAC JOINT INJECTION    For low back pain and groin pain.    Standing Status:   Standing    Number of Occurrences:   1    Standing Expiration Date:   03/11/2020    Scheduling Instructions:     Side: Bilateral     Sedation: with     TIMEFRAME: PRN procedure. (Ms. Kipp will call when needed.)    Order Specific Question:   Where will this procedure be performed?    Answer:   [facility]  . ToxASSURE Select 13 (MW), Urine    Volume: 30 ml(s). Minimum 3 ml of urine is needed. Document temperature of fresh sample. Indications: Long term (current) use of opiate analgesic [id number])   Follow-up plan:   Return in about 3 months (around 06/12/2019) for Medication Management.    Recent Visits Date Type Provider Dept  02/02/19 Procedure visit Gillis Santa, MD [facility]  01/06/19 Office Visit Gillis Santa, MD [facility]  Showing recent visits within past 90 days and meeting all other requirements   Today's Visits Date Type Provider Dept  03/12/19 Office Visit Gillis Santa, MD [facility]  Showing today's visits and meeting all other requirements   Future Appointments No visits were found meeting these conditions.  Showing future appointments within next 90 days and meeting all other requirements   I discussed the assessment and  treatment plan with the patient. The patient was provided an opportunity to ask questions and all were answered. The patient agreed with the plan and demonstrated an  understanding of the instructions.  Patient advised to call back or seek an in-person evaluation if the symptoms or condition worsens.  Total duration of non-face-to-face encounter: 25 minutes.  Note by: Gillis Santa, MD Date: 03/12/2019; Time: 12:27 PM  Note: This dictation was prepared with Dragon dictation. Any transcriptional errors that may result from this process are unintentional.  Disclaimer:  * Given the special circumstances of the COVID-19 pandemic, the federal government has announced that the Office for Civil Rights (OCR) will exercise its enforcement discretion and will not impose penalties on physicians using telehealth in the event of noncompliance with regulatory requirements under the [place] (HIPAA) in connection with the good faith provision of telehealth during the XX123456 national public health emergency. ([place])

## 2019-05-27 ENCOUNTER — Encounter: Payer: Self-pay | Admitting: Student in an Organized Health Care Education/Training Program

## 2019-05-28 ENCOUNTER — Ambulatory Visit
Payer: BC Managed Care – PPO | Attending: Student in an Organized Health Care Education/Training Program | Admitting: Student in an Organized Health Care Education/Training Program

## 2019-05-28 ENCOUNTER — Other Ambulatory Visit: Payer: Self-pay

## 2019-05-28 ENCOUNTER — Encounter: Payer: Self-pay | Admitting: Student in an Organized Health Care Education/Training Program

## 2019-05-28 DIAGNOSIS — M533 Sacrococcygeal disorders, not elsewhere classified: Secondary | ICD-10-CM

## 2019-05-28 DIAGNOSIS — M47818 Spondylosis without myelopathy or radiculopathy, sacral and sacrococcygeal region: Secondary | ICD-10-CM

## 2019-05-28 DIAGNOSIS — G894 Chronic pain syndrome: Secondary | ICD-10-CM

## 2019-05-28 DIAGNOSIS — M47816 Spondylosis without myelopathy or radiculopathy, lumbar region: Secondary | ICD-10-CM

## 2019-05-28 DIAGNOSIS — Z79891 Long term (current) use of opiate analgesic: Secondary | ICD-10-CM

## 2019-05-28 DIAGNOSIS — M5136 Other intervertebral disc degeneration, lumbar region: Secondary | ICD-10-CM

## 2019-05-28 MED ORDER — OXYCODONE-ACETAMINOPHEN 5-325 MG PO TABS: 1.0000 | ORAL_TABLET | Freq: Three times a day (TID) | ORAL | 0 refills | Status: DC | PRN

## 2019-05-28 NOTE — Progress Notes (Signed)
Patient: [name]  Service Category: E/M  Provider: Gillis Santa, MD  DOB: 1971-01-11  DOS: 05/28/2019  Location: Office  MRN: [id number]  Setting: Ambulatory outpatient  Referring Provider: [facility]  Type: Established Patient  Specialty: Interventional Pain Management  PCP: [facility]  Location: Home  Delivery: TeleHealth     Virtual Encounter - Pain Management PROVIDER NOTE: Information contained herein reflects review and annotations entered in association with encounter. Interpretation of such information and data should be left to medically-trained personnel. Information provided to patient can be located elsewhere in the medical record under "Patient Instructions". Document created using STT-dictation technology, any transcriptional errors that may result from process are unintentional.    Contact & Pharmacy Preferred: [phone] Home: [phone] (home) Mobile: There is no such number on file (mobile). E-mail: No e-mail address on record  [place], [place] - [address] [place] [address] Phone: [phone] Fax: [phone]   Pre-screening  [name] offered "in-person" vs "virtual" encounter. She indicated preferring virtual for this encounter.   Reason COVID-19*  Social distancing based on CDC and AMA recommendations.   I contacted [name] on 05/28/2019 via telephone.      I clearly identified myself as Gillis Santa, MD. I verified that I was speaking with the correct person using two identifiers (Name: [name], and date of birth: [birth year]).  This visit was completed via telephone due to the restrictions of the COVID-19 pandemic. All issues as above were discussed and addressed but no physical exam was performed. If it was felt that the patient should be evaluated in the office, they were directed there. The patient verbally consented to this visit. Patient was  unable to complete an audio/visual visit due to Technical difficulties and/or Lack of internet. Due to the catastrophic nature of the COVID-19 pandemic, this visit was done through audio contact only.  Location of the patient: home address (see Epic for details)  Location of the provider: office  Consent I sought verbal advanced consent from [name] for virtual visit interactions. I informed Ms. Brandis of possible security and privacy concerns, risks, and limitations associated with providing "not-in-person" medical evaluation and management services. I also informed Ms. Nagasawa of the availability of "in-person" appointments. Finally, I informed her that there would be a charge for the virtual visit and that she could be  personally, fully or partially, financially responsible for it. [name] expressed understanding and agreed to proceed.   Historic Elements   [name] is a 49 y.o. year old, female patient evaluated today after her last encounter by our practice on 03/12/2019. [name]  has a past medical history of Chronic back pain, Hypertension, Migraine, and Plumbism. She also  has a past surgical history that includes Abdominal hysterectomy; Cholecystectomy; and Hand surgery. Ms. Mantis has a current medication list which includes the following prescription(s): acetaminophen, famotidine, metoprolol succinate, temazepam, hydroxyzine, [START ON 06/10/2019] oxycodone-acetaminophen, [START ON 07/10/2019] oxycodone-acetaminophen, [START ON 08/09/2019] oxycodone-acetaminophen, ranitidine hcl, and venlafaxine xr. She  reports that she has been smoking cigarettes. She has a 20.00 pack-year smoking history. She has never used smokeless tobacco. She reports that she does not drink alcohol or use drugs. [name] is allergic to buprenorphine; cefaclor; gabapentin; ibuprofen; levofloxacin; macrobid [nitrofurantoin monohyd macro]; naproxen; and penicillins.   HPI  Today, she is being contacted for  medication management.   Increased pain  in her SI joints- is interested in repeating sacroiliac joint injection.  Previous one was performed in September 2020. Patient states that she has discontinued her Eliquis from recommendation from prescribing provider. Otherwise continues medications as prescribed.  We will refill as below.  Instructed patient that we will need to perform annual urine toxicology screen when she presents for SI joint injection.  Patient endorsed understanding.  Pharmacotherapy Assessment  Analgesic:   05/11/2019  1   03/12/2019  Oxycodone-Acetaminophen 5-325  75.00  13 Un Pha   [id number]   Nor (6917)   0  43.27 MME  Comm Ins   [place]    Monitoring: Pharmacotherapy: No side-effects or adverse reactions reported. [place] PMP: PDMP reviewed during this encounter.       Compliance: No problems identified. Effectiveness: Clinically acceptable. Plan: Refer to "POC".  UDS:  Summary  Date Value Ref Range Status  02/18/2018 FINAL  Final    Comment:    ==================================================================== TOXASSURE SELECT 13 (MW) ==================================================================== Test                             Result       Flag       Units Drug Present and Declared for Prescription Verification   Oxazepam                       736          EXPECTED   ng/mg creat   Temazepam                      1561         EXPECTED   ng/mg creat    Oxazepam and temazepam are expected metabolites of diazepam.    Oxazepam is also an expected metabolite of other benzodiazepine    drugs, including chlordiazepoxide, prazepam, clorazepate,    halazepam, and temazepam.  Oxazepam and temazepam are available    as scheduled prescription medications.   Oxycodone                      1555         EXPECTED   ng/mg creat   Oxymorphone                    4264         EXPECTED   ng/mg creat   Noroxycodone                   5839         EXPECTED   ng/mg creat    Noroxymorphone                 2000         EXPECTED   ng/mg creat    Sources of oxycodone are scheduled prescription medications.    Oxymorphone, noroxycodone, and noroxymorphone are expected    metabolites of oxycodone. Oxymorphone is also available as a    scheduled prescription medication. ==================================================================== Test                      Result    Flag   Units      Ref Range   Creatinine              33               mg/dL      >=  20 ==================================================================== Declared Medications:  The flagging and interpretation on this report are based on the  following declared medications.  Unexpected results may arise from  inaccuracies in the declared medications.  **Note: The testing scope of this panel includes these medications:  Oxycodone (Oxycodone Acetaminophen)  Temazepam (Restoril)  **Note: The testing scope of this panel does not include following  reported medications:  Acetaminophen  Acetaminophen (Oxycodone Acetaminophen)  Apixaban (Eliquis)  Metoprolol (Toprol)  Ranitidine ==================================================================== For clinical consultation, please call [phone]. ====================================================================    Laboratory Chemistry Profile (12 mo)  Renal: 12/01/2018: BUN 10; Creatinine, Ser 1.25  Lab Results  Component Value Date   GFRAA 58 (L) 12/01/2018   GFRNONAA 50 (L) 12/01/2018   Hepatic: No results found for requested labs within last 8760 hours. Lab Results  Component Value Date   AST 17 03/24/2013   ALT 8 03/24/2013   Other: No results found for requested labs within last 8760 hours. Note: Above Lab results reviewed.  Imaging  DG PAIN CLINIC C-ARM 1-60 MIN NO REPORT Fluoro was used, but no Radiologist interpretation will be provided.  Please refer to "NOTES" tab for provider progress note.   Assessment  The  primary encounter diagnosis was SI joint arthritis. Diagnoses of Chronic pain syndrome, Lumbar spondylosis, Lumbar degenerative disc disease, and Long term prescription opiate use were also pertinent to this visit.  Plan of Care  Problem-specific:  No problem-specific Assessment & Plan notes found for this encounter.  I have discontinued Nilsa Nutting. Jurich's Eliquis. I am also having her start on oxyCODONE-acetaminophen and oxyCODONE-acetaminophen. Additionally, I am having her maintain her acetaminophen, metoprolol succinate, RaNITidine HCl (ZANTAC PO), temazepam, famotidine, hydrOXYzine, venlafaxine XR, and oxyCODONE-acetaminophen.  Pharmacotherapy (Medications Ordered): Meds ordered this encounter  Medications  . oxyCODONE-acetaminophen (PERCOCET) 5-325 MG tablet    Sig: Take 1-2 tablets by mouth every 8 (eight) hours as needed for severe pain.    Dispense:  75 tablet    Refill:  0    Do not place this medication, or any other prescription from our practice, on "Automatic Refill". Patient may have prescription filled one day early if pharmacy is closed on scheduled refill date.  Marland Kitchen oxyCODONE-acetaminophen (PERCOCET) 5-325 MG tablet    Sig: Take 1-2 tablets by mouth every 8 (eight) hours as needed for severe pain.    Dispense:  75 tablet    Refill:  0    Do not place this medication, or any other prescription from our practice, on "Automatic Refill". Patient may have prescription filled one day early if pharmacy is closed on scheduled refill date.  Marland Kitchen oxyCODONE-acetaminophen (PERCOCET) 5-325 MG tablet    Sig: Take 1-2 tablets by mouth every 8 (eight) hours as needed for severe pain.    Dispense:  75 tablet    Refill:  0    Do not place this medication, or any other prescription from our practice, on "Automatic Refill". Patient may have prescription filled one day early if pharmacy is closed on scheduled refill date.   Orders:  Orders Placed This Encounter  Procedures  . S-I Blk (Schedule)     Standing Status:   Future    Standing Expiration Date:   06/28/2019    Scheduling Instructions:     Side: Bilateral     Sedation: with     Timeframe: ASAP    Order Specific Question:   Where will this procedure be performed?    Answer:   [facility]  .  UDS (Compliance-13) (ToxAssure) ([facility]) (Established Pt.)    Volume: 30 ml(s). Minimum 3 ml of urine is needed. Document temperature of fresh sample. Indications: Long term (current) use of opiate analgesic [id number])   Follow-up plan:   Return in about 11 days (around 06/08/2019) for SI-J , with sedation.     }    Recent Visits Date Type Provider Dept  03/12/19 Office Visit Gillis Santa, MD [facility]  Showing recent visits within past 90 days and meeting all other requirements   Today's Visits Date Type Provider Dept  05/28/19 Office Visit Gillis Santa, MD [facility]  Showing today's visits and meeting all other requirements   Future Appointments No visits were found meeting these conditions.  Showing future appointments within next 90 days and meeting all other requirements   I discussed the assessment and treatment plan with the patient. The patient was provided an opportunity to ask questions and all were answered. The patient agreed with the plan and demonstrated an understanding of the instructions.  Patient advised to call back or seek an in-person evaluation if the symptoms or condition worsens.  Total duration of non-face-to-face encounter: 30 minutes.  Note by: Gillis Santa, MD Date: 05/28/2019; Time: 8:31 AM

## 2019-08-25 ENCOUNTER — Telehealth: Payer: Self-pay | Admitting: *Deleted

## 2019-08-25 ENCOUNTER — Other Ambulatory Visit: Payer: Self-pay

## 2019-08-25 ENCOUNTER — Ambulatory Visit
Payer: BC Managed Care – PPO | Attending: Student in an Organized Health Care Education/Training Program | Admitting: Student in an Organized Health Care Education/Training Program

## 2019-08-25 ENCOUNTER — Encounter: Payer: Self-pay | Admitting: Student in an Organized Health Care Education/Training Program

## 2019-08-25 DIAGNOSIS — G894 Chronic pain syndrome: Secondary | ICD-10-CM

## 2019-08-25 DIAGNOSIS — M47818 Spondylosis without myelopathy or radiculopathy, sacral and sacrococcygeal region: Secondary | ICD-10-CM

## 2019-08-25 DIAGNOSIS — Z79891 Long term (current) use of opiate analgesic: Secondary | ICD-10-CM | POA: Diagnosis not present

## 2019-08-25 MED ORDER — OXYCODONE-ACETAMINOPHEN 5-325 MG PO TABS: 1.0000 | ORAL_TABLET | Freq: Three times a day (TID) | ORAL | 0 refills | Status: DC | PRN

## 2019-08-25 NOTE — Progress Notes (Signed)
Patient: [name]  Service Category: E/M  Provider: Gillis Santa, MD  DOB: 1971/05/03  DOS: 08/25/2019  Location: Office  MRN: [id number]  Setting: Ambulatory outpatient  Referring Provider: [facility]  Type: Established Patient  Specialty: Interventional Pain Management  PCP: [facility]  Location: Home  Delivery: TeleHealth     Virtual Encounter - Pain Management PROVIDER NOTE: Information contained herein reflects review and annotations entered in association with encounter. Interpretation of such information and data should be left to medically-trained personnel. Information provided to patient can be located elsewhere in the medical record under "Patient Instructions". Document created using STT-dictation technology, any transcriptional errors that may result from process are unintentional.    Contact & Pharmacy Preferred: [phone] Home: [phone] (home) Mobile: There is no such number on file (mobile). E-mail: No e-mail address on record  [place], [place] - [address] [place] [address] Phone: [phone] Fax: [phone]   Pre-screening  [name] offered "in-person" vs "virtual" encounter. She indicated preferring virtual for this encounter.   Reason COVID-19*  Social distancing based on CDC and AMA recommendations.   I contacted [name] on 08/25/2019 via telephone.      I clearly identified myself as Gillis Santa, MD. I verified that I was speaking with the correct person using two identifiers (Name: [name], and date of birth: 10-20-1970).  This visit was completed via telephone due to the restrictions of the COVID-19 pandemic. All issues as above were discussed and addressed but no physical exam was performed. If it was felt that the patient should be evaluated in the office, they were directed there. The patient verbally consented to this visit. Patient was  unable to complete an audio/visual visit due to Technical difficulties and/or Lack of internet. Due to the catastrophic nature of the COVID-19 pandemic, this visit was done through audio contact only.  Location of the patient: home address (see Epic for details)  Location of the provider: office  Consent I sought verbal advanced consent from [name] for virtual visit interactions. I informed [name] of possible security and privacy concerns, risks, and limitations associated with providing "not-in-person" medical evaluation and management services. I also informed [name] of the availability of "in-person" appointments. Finally, I informed her that there would be a charge for the virtual visit and that she could be  personally, fully or partially, financially responsible for it. [name] expressed understanding and agreed to proceed.   Historic Elements   [name] is a 49 y.o. year old, female patient evaluated today after her last contact with our practice on 05/28/2019. [name]  has a past medical history of Chronic back pain, Hypertension, Migraine, and Plumbism. She also  has a past surgical history that includes Abdominal hysterectomy; Cholecystectomy; and Hand surgery. [name] has a current medication list which includes the following prescription(s): acetaminophen, famotidine, hydroxyzine, metoprolol succinate, [START ON 09/07/2019] oxycodone-acetaminophen, [START ON 10/07/2019] oxycodone-acetaminophen, [START ON 11/06/2019] oxycodone-acetaminophen, temazepam, venlafaxine xr, and ranitidine hcl. She  reports that she has been smoking cigarettes. She has a 20.00 pack-year smoking history. She has never used smokeless tobacco. She reports that she does not drink alcohol or use drugs. [name] is allergic to buprenorphine; cefaclor; gabapentin; ibuprofen; levofloxacin; macrobid [nitrofurantoin monohyd macro]; naproxen; and penicillins.   HPI  Today, she is being contacted for  medication management.  No change in  medical history since last visit.  Patient's pain is at baseline.  Patient continues multimodal pain regimen as prescribed.  States that it provides pain relief and improvement in functional status. Would like to repeat SI-J injection for worsening SI joint pain, previously done 02/02/2019  Pharmacotherapy Assessment  Analgesic:  08/08/2019  1   05/28/2019  Oxycodone-Acetaminophen 5-325  75.00  13 Un Pha   [id number]   Nor (6917)   0  43.27 MME  Comm Ins   [place]    Monitoring: [place] PMP: PDMP reviewed during this encounter.       Pharmacotherapy: No side-effects or adverse reactions reported. Compliance: No problems identified. Effectiveness: Clinically acceptable. Plan: Refer to "POC".  UDS:  Summary  Date Value Ref Range Status  02/18/2018 FINAL  Final    Comment:    ==================================================================== TOXASSURE SELECT 13 (MW) ==================================================================== Test                             Result       Flag       Units Drug Present and Declared for Prescription Verification   Oxazepam                       736          EXPECTED   ng/mg creat   Temazepam                      1561         EXPECTED   ng/mg creat    Oxazepam and temazepam are expected metabolites of diazepam.    Oxazepam is also an expected metabolite of other benzodiazepine    drugs, including chlordiazepoxide, prazepam, clorazepate,    halazepam, and temazepam.  Oxazepam and temazepam are available    as scheduled prescription medications.   Oxycodone                      1555         EXPECTED   ng/mg creat   Oxymorphone                    4264         EXPECTED   ng/mg creat   Noroxycodone                   5839         EXPECTED   ng/mg creat   Noroxymorphone                 2000         EXPECTED   ng/mg creat    Sources of oxycodone are scheduled prescription medications.    Oxymorphone, noroxycodone, and  noroxymorphone are expected    metabolites of oxycodone. Oxymorphone is also available as a    scheduled prescription medication. ==================================================================== Test                      Result    Flag   Units      Ref Range   Creatinine              33               mg/dL      >=20 ==================================================================== Declared Medications:  The flagging and interpretation on this report are based on the  following  declared medications.  Unexpected results may arise from  inaccuracies in the declared medications.  **Note: The testing scope of this panel includes these medications:  Oxycodone (Oxycodone Acetaminophen)  Temazepam (Restoril)  **Note: The testing scope of this panel does not include following  reported medications:  Acetaminophen  Acetaminophen (Oxycodone Acetaminophen)  Apixaban (Eliquis)  Metoprolol (Toprol)  Ranitidine ==================================================================== For clinical consultation, please call [phone]. ====================================================================    Laboratory Chemistry Profile   Renal Lab Results  Component Value Date   BUN 10 12/01/2018   CREATININE 1.25 (H) 12/01/2018   GFRAA 58 (L) 12/01/2018   GFRNONAA 50 (L) 12/01/2018     Hepatic Lab Results  Component Value Date   AST 17 03/24/2013   ALT 8 03/24/2013   ALBUMIN 3.6 03/24/2013   ALKPHOS 90 03/24/2013   LIPASE 25 03/24/2013     Electrolytes Lab Results  Component Value Date   NA 143 12/01/2018   K 3.4 (L) 12/01/2018   CL 112 (H) 12/01/2018   CALCIUM 8.8 (L) 12/01/2018     Bone No results found for: VD25OH, VD125OH2TOT, [id number], BO1751WC5, 25OHVITD1, 25OHVITD2, 25OHVITD3, TESTOFREE, TESTOSTERONE   Inflammation (CRP: Acute Phase) (ESR: Chronic Phase) No results found for: CRP, ESRSEDRATE, LATICACIDVEN     Note: Above Lab results reviewed.   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 Diagnoses of Chronic pain syndrome and Long term prescription opiate use were also pertinent to this visit.  Plan of Care   [name] has a current medication list which includes the following long-term medication(s): famotidine, metoprolol succinate, temazepam, venlafaxine xr, and ranitidine hcl.  1. Chronic pain syndrome - oxyCODONE-acetaminophen (PERCOCET) 5-325 MG tablet; Take 1-2 tablets by mouth every 8 (eight) hours as needed for severe pain.  Dispense: 75 tablet; Refill: 0 - oxyCODONE-acetaminophen (PERCOCET) 5-325 MG tablet; Take 1-2 tablets by mouth every 8 (eight) hours as needed for severe pain.  Dispense: 75 tablet; Refill: 0 - oxyCODONE-acetaminophen (PERCOCET) 5-325 MG tablet; Take 1-2 tablets by mouth every 8 (eight) hours as needed for severe pain.  Dispense: 75 tablet; Refill: 0 - ToxASSURE Select 13 (MW), Urine  2. SI joint arthritis - SI joint arthritis, patient finds benefit with every 6 month - 9 month SI joint injections, would like to repeat. - SACROILIAC JOINT INJECTION; Future  3. Long term prescription opiate use - ToxASSURE Select 13 (MW), Urine   Pharmacotherapy (Medications Ordered): Meds ordered this encounter  Medications  . oxyCODONE-acetaminophen (PERCOCET) 5-325 MG tablet    Sig: Take 1-2 tablets by mouth every 8 (eight) hours as needed for severe pain.    Dispense:  75 tablet    Refill:  0    Do not place this medication, or any other prescription from our practice, on "Automatic Refill". Patient may have prescription filled one day early if pharmacy is closed on scheduled refill date.  Marland Kitchen oxyCODONE-acetaminophen (PERCOCET) 5-325 MG tablet    Sig: Take 1-2 tablets by mouth every 8 (eight) hours as needed for severe pain.    Dispense:  75 tablet    Refill:  0    Do not place this medication, or any other prescription from our practice, on "Automatic Refill". Patient may have  prescription filled one day early if pharmacy is closed on scheduled refill date.  Marland Kitchen oxyCODONE-acetaminophen (PERCOCET) 5-325 MG tablet    Sig: Take 1-2 tablets by mouth every 8 (eight) hours as needed for severe pain.    Dispense:  75 tablet    Refill:  0    Do not place this medication, or any other prescription from our practice, on "Automatic Refill". Patient may have prescription filled one day early if pharmacy is closed on scheduled refill date.   Orders:  Orders Placed This Encounter  Procedures  . SACROILIAC JOINT INJECTION    Standing Status:   Future    Standing Expiration Date:   09/24/2019    Scheduling Instructions:     Side: Bilateral     Sedation: with     Timeframe: ASAP    Order Specific Question:   Where will this procedure be performed?    Answer:   [facility]  . ToxASSURE Select 13 (MW), Urine    Volume: 30 ml(s). Minimum 3 ml of urine is needed. Document temperature of fresh sample. Indications: Long term (current) use of opiate analgesic (W73.710)   Follow-up plan:   Return in about 1 week (around 09/01/2019) for BL SIJ, with sedation, (UDS).     Recent Visits Date Type Provider Dept  05/28/19 Office Visit Gillis Santa, MD [facility]  Showing recent visits within past 90 days and meeting all other requirements   Today's Visits Date Type Provider Dept  08/25/19 Office Visit Gillis Santa, MD [facility]  Showing today's visits and meeting all other requirements   Future Appointments No visits were found meeting these conditions.  Showing future appointments within next 90 days and meeting all other requirements   I discussed the assessment and treatment plan with the patient. The patient was provided an opportunity to ask questions and all were answered. The patient agreed with the plan and demonstrated an understanding of the instructions.  Patient advised to call back or seek an in-person evaluation if the symptoms or  condition worsens.  Duration of encounter: 89mnutes.  Note by: BGillis Santa MD Date: 08/25/2019; Time: 9:45 AM

## 2019-08-26 ENCOUNTER — Telehealth: Payer: Self-pay

## 2019-08-26 DIAGNOSIS — M7918 Myalgia, other site: Secondary | ICD-10-CM

## 2019-08-26 NOTE — Telephone Encounter (Signed)
Insurance denied authorization for the Nada. They do not allow appeals. What do you want to do from here? It seems that the SIJI injections are hard to get approved for some of these insurance carriers.

## 2019-08-26 NOTE — Telephone Encounter (Signed)
That is frustrating. Please just schedule for piriformis TPI b/l then. MNB

## 2019-08-31 ENCOUNTER — Ambulatory Visit: Payer: BC Managed Care – PPO | Admitting: Student in an Organized Health Care Education/Training Program

## 2019-09-21 ENCOUNTER — Emergency Department (HOSPITAL_COMMUNITY)
Admission: EM | Admit: 2019-09-21 | Discharge: 2019-09-22 | Disposition: A | Discharge status: Home / Self Care | Payer: BC Managed Care – PPO | Attending: Emergency Medicine | Admitting: Emergency Medicine

## 2019-09-21 ENCOUNTER — Emergency Department (HOSPITAL_COMMUNITY): Payer: BC Managed Care – PPO

## 2019-09-21 ENCOUNTER — Encounter (HOSPITAL_COMMUNITY): Payer: Self-pay | Admitting: Emergency Medicine

## 2019-09-21 ENCOUNTER — Other Ambulatory Visit: Payer: Self-pay

## 2019-09-21 DIAGNOSIS — R197 Diarrhea, unspecified: Secondary | ICD-10-CM | POA: Diagnosis not present

## 2019-09-21 DIAGNOSIS — I10 Essential (primary) hypertension: Secondary | ICD-10-CM | POA: Diagnosis not present

## 2019-09-21 DIAGNOSIS — Z79899 Other long term (current) drug therapy: Secondary | ICD-10-CM | POA: Diagnosis not present

## 2019-09-21 DIAGNOSIS — F17210 Nicotine dependence, cigarettes, uncomplicated: Secondary | ICD-10-CM | POA: Diagnosis not present

## 2019-09-21 DIAGNOSIS — R1030 Lower abdominal pain, unspecified: Secondary | ICD-10-CM | POA: Insufficient documentation

## 2019-09-21 DIAGNOSIS — R112 Nausea with vomiting, unspecified: Secondary | ICD-10-CM | POA: Diagnosis present

## 2019-09-21 LAB — URINALYSIS, ROUTINE W REFLEX MICROSCOPIC
Bilirubin Urine: NEGATIVE
Glucose, UA: NEGATIVE mg/dL
Ketones, ur: 20 mg/dL — AB
Nitrite: NEGATIVE
Protein, ur: 30 mg/dL — AB
Specific Gravity, Urine: 1.021 (ref 1.005–1.030)
pH: 5 (ref 5.0–8.0)

## 2019-09-21 LAB — CBC
HCT: 47.7 % — ABNORMAL HIGH (ref 36.0–46.0)
Hemoglobin: 16.1 g/dL — ABNORMAL HIGH (ref 12.0–15.0)
MCH: 30.2 pg (ref 26.0–34.0)
MCHC: 33.8 g/dL (ref 30.0–36.0)
MCV: 89.5 fL (ref 80.0–100.0)
Platelets: 538 10*3/uL — ABNORMAL HIGH (ref 150–400)
RBC: 5.33 MIL/uL — ABNORMAL HIGH (ref 3.87–5.11)
RDW: 13.2 % (ref 11.5–15.5)
WBC: 15.2 10*3/uL — ABNORMAL HIGH (ref 4.0–10.5)
nRBC: 0 % (ref 0.0–0.2)

## 2019-09-21 LAB — COMPREHENSIVE METABOLIC PANEL
ALT: 22 U/L (ref 0–44)
AST: 23 U/L (ref 15–41)
Albumin: 4.6 g/dL (ref 3.5–5.0)
Alkaline Phosphatase: 133 U/L — ABNORMAL HIGH (ref 38–126)
Anion gap: 19 — ABNORMAL HIGH (ref 5–15)
BUN: 21 mg/dL — ABNORMAL HIGH (ref 6–20)
CO2: 23 mmol/L (ref 22–32)
Calcium: 10.1 mg/dL (ref 8.9–10.3)
Chloride: 95 mmol/L — ABNORMAL LOW (ref 98–111)
Creatinine, Ser: 1.08 mg/dL — ABNORMAL HIGH (ref 0.44–1.00)
GFR calc Af Amer: >60 mL/min (ref >60)
GFR calc non Af Amer: 60 mL/min — ABNORMAL LOW (ref >60)
Glucose, Bld: 123 mg/dL — ABNORMAL HIGH (ref 70–99)
Potassium: 3.8 mmol/L (ref 3.5–5.1)
Sodium: 137 mmol/L (ref 135–145)
Total Bilirubin: 1.3 mg/dL — ABNORMAL HIGH (ref 0.3–1.2)
Total Protein: 8.7 g/dL — ABNORMAL HIGH (ref 6.5–8.1)

## 2019-09-21 LAB — LIPASE, BLOOD: Lipase: 33 U/L (ref 11–51)

## 2019-09-21 IMAGING — CT CT ABD-PELV W/ CM
2 of 5 series · 16 of 46 positions shown, 18 images · IV contrast (Omnipaque or Isovue)
Comparison: [DATE]

CLINICAL DATA: Left lower quadrant pain for 2 days

EXAM:
CT ABDOMEN AND PELVIS WITH CONTRAST
TECHNIQUE: Multidetector CT imaging of the abdomen and pelvis was performed
using the standard protocol following bolus administration of
intravenous contrast.
CONTRAST:  100mL OMNIPAQUE IOHEXOL 300 MG/ML  SOLN

[Series 2: axial st · axial · 0.55mm/px · z∈[-407,-32]mm · 13 of 85 slices shown, 15 images]
[im 5/85  soft-tissue]
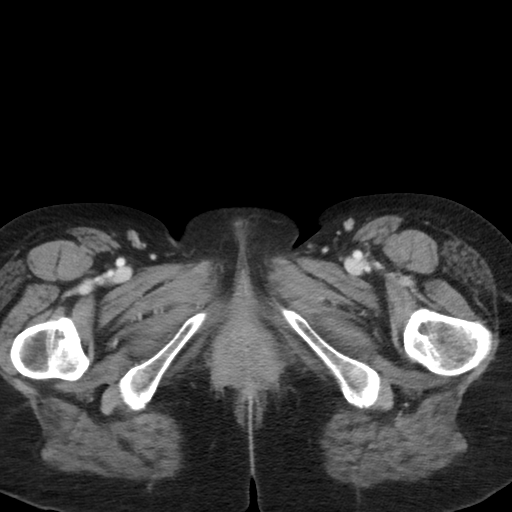
[im 5/85  bone]
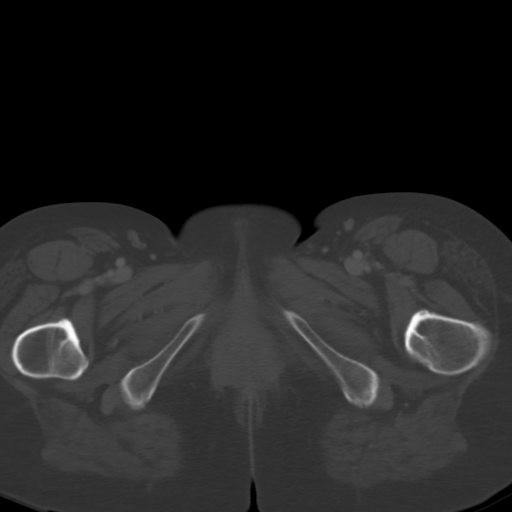
[im 10/85  soft-tissue]
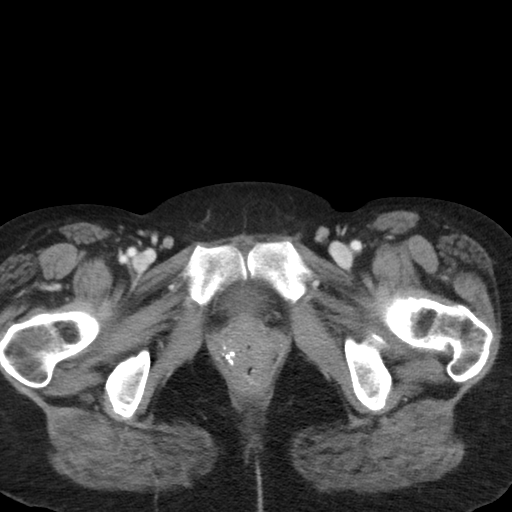
[im 19/85  soft-tissue]
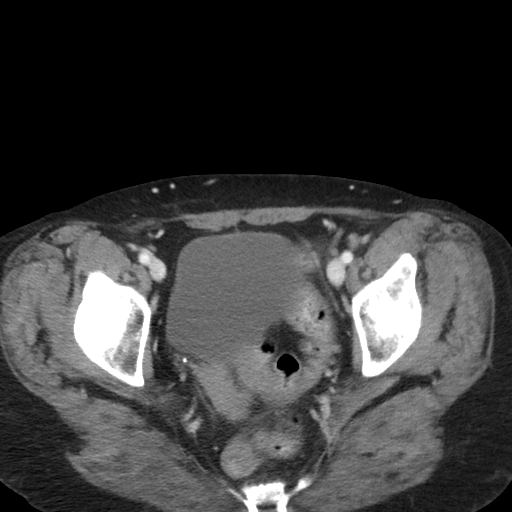
[im 24/85  soft-tissue]
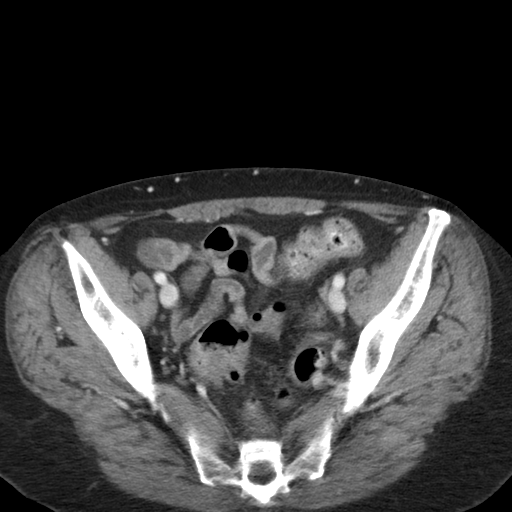
[im 29/85  soft-tissue]
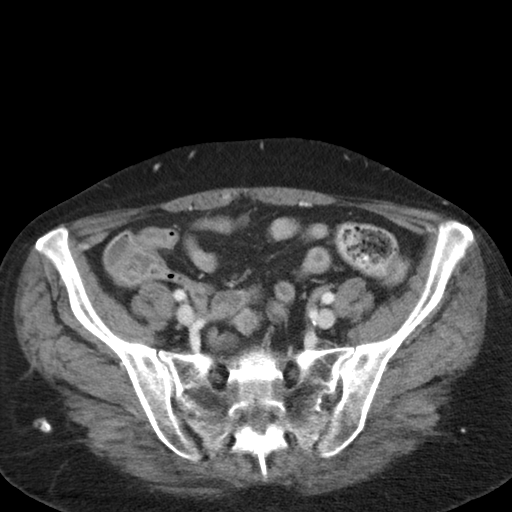
[im 38/85  soft-tissue]
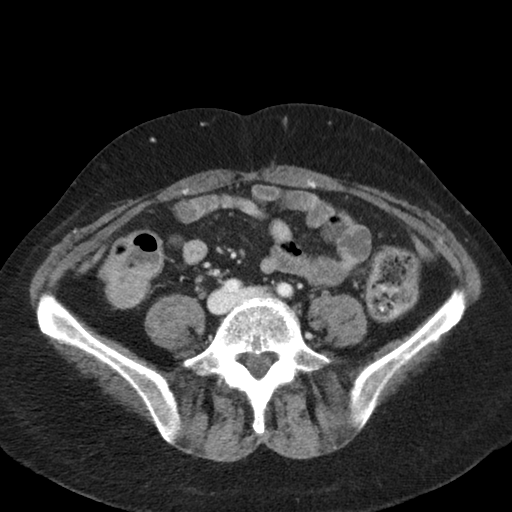
[im 43/85  soft-tissue]
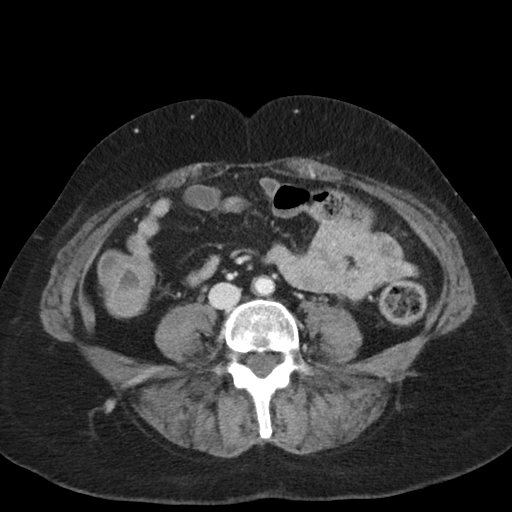
[im 47/85  soft-tissue]
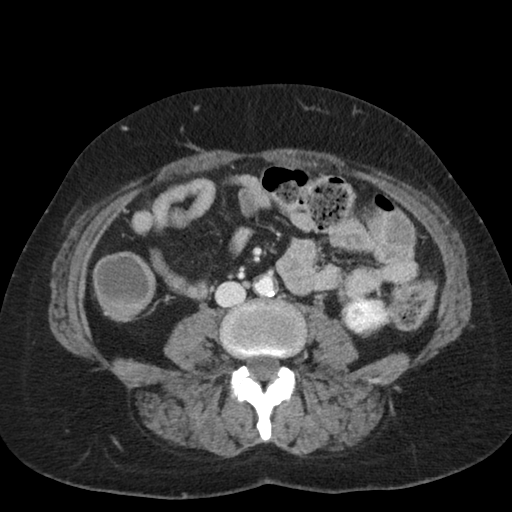
[im 57/85  soft-tissue]
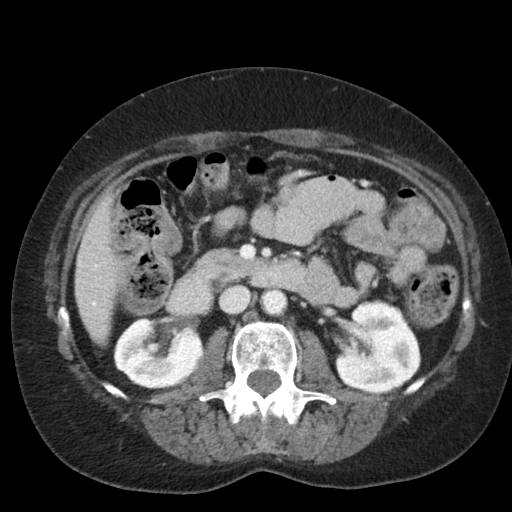
[im 57/85  bone]
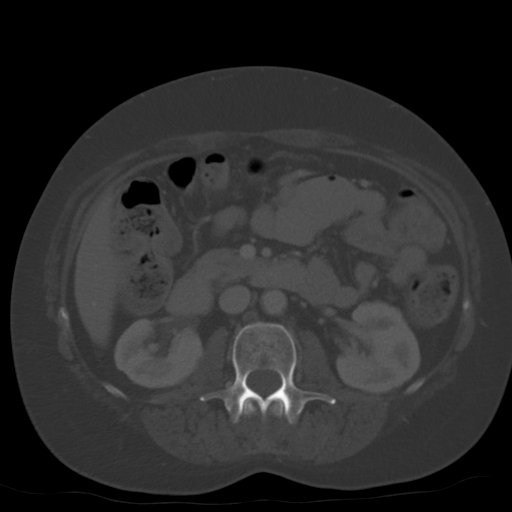
[im 61/85  soft-tissue]
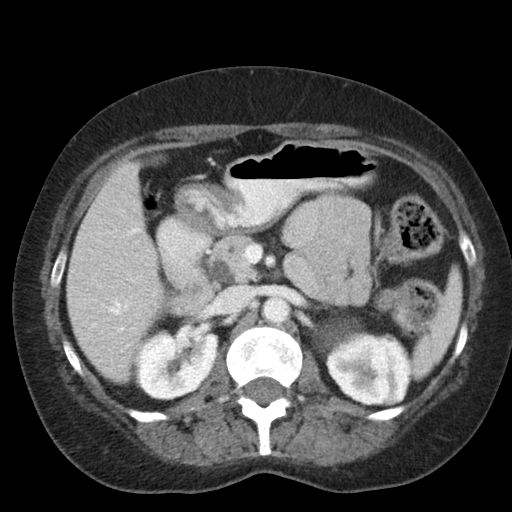
[im 66/85  soft-tissue]
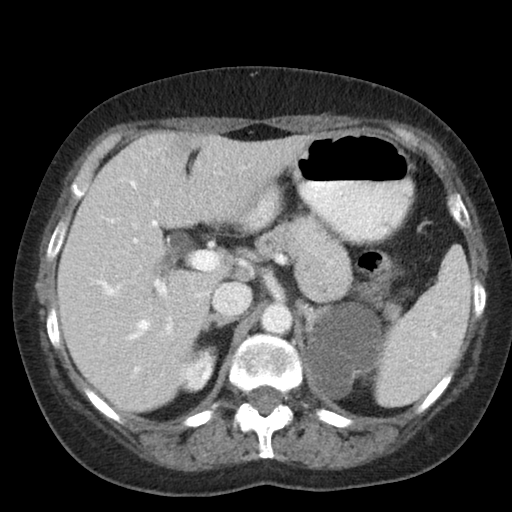
[im 75/85  soft-tissue]
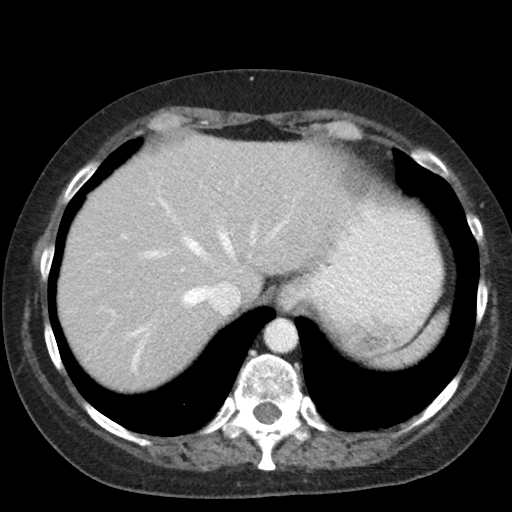
[im 80/85  soft-tissue]
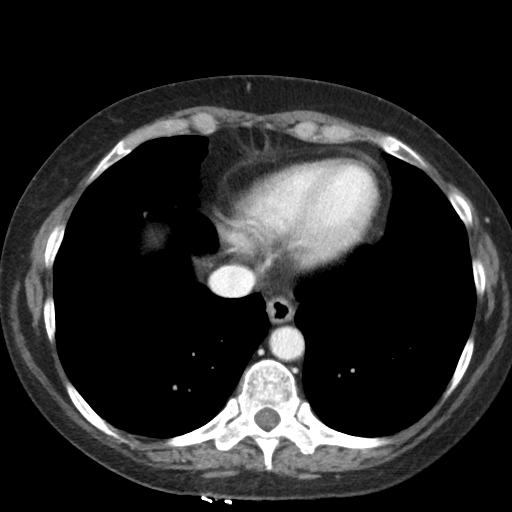

[Series 5: coronal st · coronal · 0.74mm/px · 3 of 71 slices shown]
[im 24/71  soft-tissue]
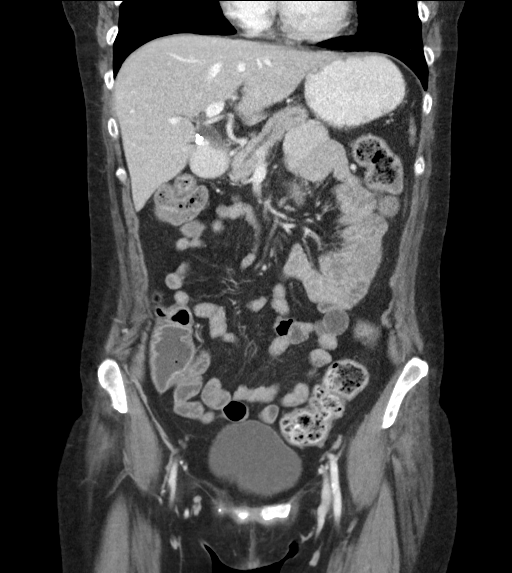
[im 32/71  soft-tissue]
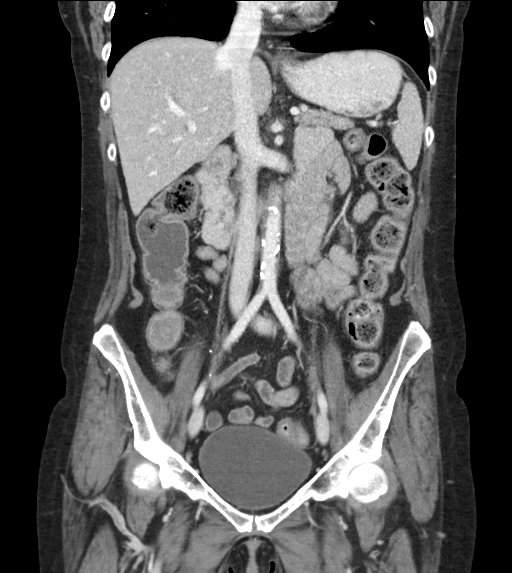
[im 39/71  soft-tissue]
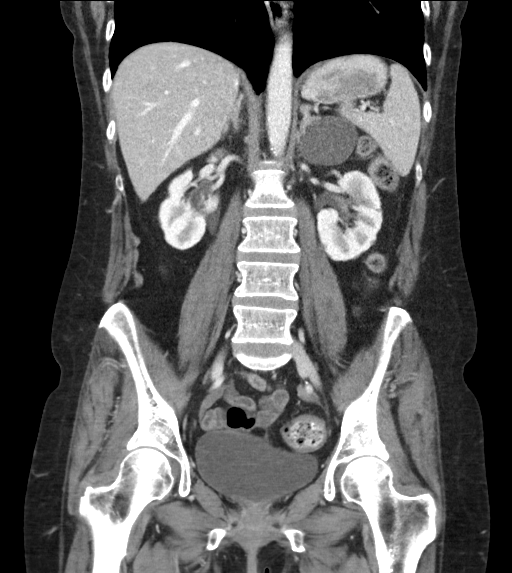

[16 of 46 positions shown; findings below may reference images not displayed]

FINDINGS: Lower chest: No acute abnormality.

Hepatobiliary: No focal liver abnormality is seen. Status post
cholecystectomy. No biliary dilatation.

Pancreas: Unremarkable. No pancreatic ductal dilatation or
surrounding inflammatory changes.

Spleen: Normal in size without focal abnormality.

Adrenals/Urinary Tract: Adrenal glands are stable. Kidneys are well
visualized bilaterally. No renal calculi or obstructive changes are
seen. Multi septated cyst is noted in the upper pole of the left
kidney stable in appearance from the prior CT examination. It
measures approximately 5.1 cm. No obstructive changes are seen. The
bladder is well distended.

Stomach/Bowel: Scattered diverticular change of the colon is noted
without evidence of diverticulitis. The appendix is within normal
limits. No obstructive or inflammatory changes of the colon are
seen. The stomach and small bowel appear within normal limits.

Vascular/Lymphatic: Aortic atherosclerosis. No enlarged abdominal or
pelvic lymph nodes.

Reproductive: Status post hysterectomy. No adnexal masses.

Other: No abdominal wall hernia or abnormality. No abdominopelvic
ascites.

Musculoskeletal: No acute or significant osseous findings.
IMPRESSION: Diverticulosis without diverticulitis.

Multi septated cyst in the upper pole of the left kidney stable in
appearance from the prior exam from [IG].

No acute abnormality noted.

## 2019-09-21 MED ORDER — PROMETHAZINE HCL 25 MG RE SUPP
25.0000 mg | Freq: Four times a day (QID) | RECTAL | 0 refills | Status: DC | PRN
Start: 2019-09-21 — End: 2019-12-01

## 2019-09-21 MED ORDER — SODIUM CHLORIDE 0.9 % IV BOLUS
1000.0000 mL | Freq: Once | INTRAVENOUS | Status: AC
  Administered 2019-09-21: 1000 mL via INTRAVENOUS

## 2019-09-21 MED ORDER — SODIUM CHLORIDE 0.9% FLUSH: 3.0000 mL | Freq: Once | INTRAVENOUS | Status: DC

## 2019-09-21 MED ORDER — DROPERIDOL 2.5 MG/ML IJ SOLN: 1.2500 mg | Freq: Once | INTRAMUSCULAR | Status: DC

## 2019-09-21 MED ORDER — HALOPERIDOL LACTATE 5 MG/ML IJ SOLN
2.0000 mg | Freq: Once | INTRAMUSCULAR | Status: AC
  Administered 2019-09-21: 2 mg via INTRAVENOUS
  Filled 2019-09-21: qty 1

## 2019-09-21 MED ORDER — ONDANSETRON HCL 4 MG/2ML IJ SOLN
4.0000 mg | Freq: Once | INTRAMUSCULAR | Status: AC
  Administered 2019-09-21: 21:00:00 4 mg via INTRAVENOUS
  Filled 2019-09-21: qty 2

## 2019-09-21 MED ORDER — MORPHINE SULFATE (PF) 4 MG/ML IV SOLN
4.0000 mg | Freq: Once | INTRAVENOUS | Status: AC
  Administered 2019-09-21: 21:00:00 4 mg via INTRAVENOUS
  Filled 2019-09-21: qty 1

## 2019-09-21 MED ORDER — HYDROMORPHONE HCL 1 MG/ML IJ SOLN
1.0000 mg | Freq: Once | INTRAMUSCULAR | Status: AC
  Administered 2019-09-21: 23:00:00 1 mg via INTRAVENOUS
  Filled 2019-09-21: qty 1

## 2019-09-21 MED ORDER — IOHEXOL 300 MG/ML  SOLN
100.0000 mL | Freq: Once | INTRAMUSCULAR | Status: AC | PRN
  Administered 2019-09-21: 100 mL via INTRAVENOUS

## 2019-09-21 NOTE — ED Triage Notes (Signed)
Pt c/o n/v/d x 2 days. Pt states she seen her pcp today and was given zofran but cannot keep it down.

## 2019-09-21 NOTE — ED Provider Notes (Signed)
[facility] EMERGENCY DEPARTMENT Provider Note   CSN: [id number] Arrival date & time: 09/21/19  1940     History Chief Complaint  Patient presents with  . Emesis    [name] is a 49 y.o. female.  She has a history of colitis and follows with GI at [facility].  She is on chronic Percocet for her abdominal pain.  Complaining of 2 days of nausea vomiting diarrhea.  Unable to hold down her pain medicine.  Saw her PCP today and given Zofran but unable to hold that down.  Feeling weak.  When she vomited she has noticed maybe some coffee-ground material.  No known fevers chills cough chest pain shortness of breath.  No urinary symptoms.  Not on immune suppressants.  No rectal bleeding or melena.  The history is provided by the patient.  Emesis Severity:  Moderate Duration:  2 days Timing:  Intermittent Quality:  Bilious material Progression:  Worsening Chronicity:  Recurrent Recent urination:  Decreased Relieved by:  Nothing Worsened by:  Nothing Ineffective treatments:  Antiemetics Associated symptoms: abdominal pain and diarrhea   Associated symptoms: no chills, no fever, no headaches and no sore throat   Abdominal pain:    Location:  LLQ and RLQ   Quality: cramping     Severity:  Moderate   Onset quality:  Gradual   Timing:  Constant   Progression:  Worsening   Chronicity:  Recurrent      Past Medical History:  Diagnosis Date  . Chronic back pain   . Hypertension   . Migraine   . Plumbism    blood clot     Patient Active Problem List   Diagnosis Date Noted  . 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 Long term prescription opiate use 05/19/2018  . Lumbar radiculopathy 02/04/2017  . Lumbar degenerative disc disease 02/04/2017  . Lumbar spondylosis 02/04/2017  . Chronic low back pain with bilateral sciatica 02/04/2017  . Chronic pain syndrome 02/04/2017  . Lumbar sprain 02/04/2017  . Chronic migraine without aura without status migrainosus, not intractable 02/04/2017     Past Surgical History:  Procedure Laterality Date  . ABDOMINAL HYSTERECTOMY    . BACK SURGERY    . CHOLECYSTECTOMY    . HAND SURGERY       OB History    Gravida  2   Para  2   Term  2   Preterm      AB      Living  2     SAB      TAB      Ectopic      Multiple      Live Births              Family History  Problem Relation Age of Onset  . Cancer Other   . Seizures Other   . Stroke Other   . Diabetes Other     Social History   Tobacco Use  . Smoking status: Current Every Day Smoker    Packs/day: 1.00    Years: 20.00    Pack years: 20.00    Types: Cigarettes  . Smokeless tobacco: Never Used  Substance Use Topics  . Alcohol use: No  . Drug use: No    Home Medications Prior to Admission medications   Medication Sig Start Date End Date Taking? Authorizing Provider  acetaminophen (TYLENOL) 500 MG tablet Take 1,000 mg by mouth every 8 (eight) hours as needed for mild pain, moderate pain,  fever or headache.     [provider]  famotidine (PEPCID) 20 MG tablet Take 20 mg by mouth 2 (two) times daily.    [provider]  hydrOXYzine (ATARAX/VISTARIL) 25 MG tablet Take 25 mg by mouth 3 (three) times daily as needed. 03/04/19   [provider]  metoprolol succinate (TOPROL-XL) 100 MG 24 hr tablet Take 100 mg by mouth daily. Take with or immediately following a meal.    [provider]  oxyCODONE-acetaminophen (PERCOCET) 5-325 MG tablet Take 1-2 tablets by mouth every 8 (eight) hours as needed for severe pain. 09/07/19 10/07/19  Gillis Santa, MD  oxyCODONE-acetaminophen (PERCOCET) 5-325 MG tablet Take 1-2 tablets by mouth every 8 (eight) hours as needed for severe pain. 10/07/19 11/06/19  Gillis Santa, MD  oxyCODONE-acetaminophen (PERCOCET) 5-325 MG tablet Take 1-2 tablets by mouth every 8 (eight) hours as needed for severe pain. 11/06/19 12/06/19  Gillis Santa, MD  RaNITidine HCl (ZANTAC PO) Take 1 tablet by mouth 3  (three) times daily.  06/24/07   [provider]  temazepam (RESTORIL) 30 MG capsule Take 30 mg by mouth at bedtime as needed for sleep.  01/17/16   [provider]  venlafaxine XR (EFFEXOR-XR) 37.5 MG 24 hr capsule Take 225 mg by mouth daily.  03/04/19   [provider]    Allergies    Buprenorphine, Cefaclor, Gabapentin, Ibuprofen, Levofloxacin, Macrobid [nitrofurantoin monohyd macro], Naproxen, and Penicillins  Review of Systems   Review of Systems  Constitutional: Negative for chills and fever.  HENT: Negative for sore throat.   Eyes: Negative for visual disturbance.  Respiratory: Negative for shortness of breath.   Cardiovascular: Negative for chest pain.  Gastrointestinal: Positive for abdominal pain, diarrhea and vomiting.  Genitourinary: Negative for dysuria.  Musculoskeletal: Negative for neck stiffness.  Skin: Negative for rash.  Neurological: Negative for headaches.    Physical Exam Updated Vital Signs BP (!) 167/107   Pulse 100   Temp 98 F (36.7 C)   Resp 18   Ht '5\' 1"'$  (1.549 m)   Wt 49.9 kg   SpO2 100%   BMI 20.78 kg/m   Physical Exam Vitals and nursing note reviewed.  Constitutional:      General: She is not in acute distress.    Appearance: She is well-developed.  HENT:     Head: Normocephalic and atraumatic.     Mouth/Throat:     Pharynx: Oropharynx is clear.  Eyes:     Conjunctiva/sclera: Conjunctivae normal.  Cardiovascular:     Rate and Rhythm: Regular rhythm. Tachycardia present.     Pulses: Normal pulses.     Heart sounds: No murmur.  Pulmonary:     Effort: Pulmonary effort is normal. No respiratory distress.     Breath sounds: Normal breath sounds.  Abdominal:     Palpations: Abdomen is soft.     Tenderness: There is abdominal tenderness (lower abdomen). There is no guarding or rebound.  Musculoskeletal:        General: No swelling, deformity or signs of injury. Normal range of motion.     Cervical back: Neck  supple.  Skin:    General: Skin is warm and dry.     Capillary Refill: Capillary refill takes less than 2 seconds.  Neurological:     General: No focal deficit present.     Mental Status: She is alert.     ED Results / Procedures / Treatments   Labs (all labs ordered are listed, but  only abnormal results are displayed) Labs Reviewed  COMPREHENSIVE METABOLIC PANEL - Abnormal; Notable for the following components:      Result Value   Chloride 95 (*)    Glucose, Bld 123 (*)    BUN 21 (*)    Creatinine, Ser 1.08 (*)    Total Protein 8.7 (*)    Alkaline Phosphatase 133 (*)    Total Bilirubin 1.3 (*)    GFR calc non Af Amer 60 (*)    Anion gap 19 (*)    All other components within normal limits  CBC - Abnormal; Notable for the following components:   WBC 15.2 (*)    RBC 5.33 (*)    Hemoglobin 16.1 (*)    HCT 47.7 (*)    Platelets 538 (*)    All other components within normal limits  URINALYSIS, ROUTINE W REFLEX MICROSCOPIC - Abnormal; Notable for the following components:   APPearance HAZY (*)    Hgb urine dipstick SMALL (*)    Ketones, ur 20 (*)    Protein, ur 30 (*)    Leukocytes,Ua TRACE (*)    Bacteria, UA MANY (*)    All other components within normal limits  URINE CULTURE  LIPASE, BLOOD    EKG EKG Interpretation  Date/Time:  Monday Sep 21 2019 23:08:00 EDT Ventricular Rate:  96 PR Interval:    QRS Duration: 94 QT Interval:  371 QTC Calculation: 469 R Axis:   -21 Text Interpretation: Sinus rhythm Borderline left axis deviation Low voltage, precordial leads nl intervals Confirmed by Aletta Edouard [phone]) on 09/21/2019 11:10:39 PM   Radiology CT Abdomen Pelvis W Contrast  Result Date: 09/21/2019 CLINICAL DATA:  Left lower quadrant pain for 2 days EXAM: CT ABDOMEN AND PELVIS WITH CONTRAST TECHNIQUE: Multidetector CT imaging of the abdomen and pelvis was performed using the standard protocol following bolus administration of intravenous contrast. CONTRAST:   167m OMNIPAQUE IOHEXOL 300 MG/ML  SOLN COMPARISON:  12/01/2018 FINDINGS: Lower chest: No acute abnormality. Hepatobiliary: No focal liver abnormality is seen. Status post cholecystectomy. No biliary dilatation. Pancreas: Unremarkable. No pancreatic ductal dilatation or surrounding inflammatory changes. Spleen: Normal in size without focal abnormality. Adrenals/Urinary Tract: Adrenal glands are stable. Kidneys are well visualized bilaterally. No renal calculi or obstructive changes are seen. Multi septated cyst is noted in the upper pole of the left kidney stable in appearance from the prior CT examination. It measures approximately 5.1 cm. No obstructive changes are seen. The bladder is well distended. Stomach/Bowel: Scattered diverticular change of the colon is noted without evidence of diverticulitis. The appendix is within normal limits. No obstructive or inflammatory changes of the colon are seen. The stomach and small bowel appear within normal limits. Vascular/Lymphatic: Aortic atherosclerosis. No enlarged abdominal or pelvic lymph nodes. Reproductive: Status post hysterectomy. No adnexal masses. Other: No abdominal wall hernia or abnormality. No abdominopelvic ascites. Musculoskeletal: No acute or significant osseous findings. IMPRESSION: Diverticulosis without diverticulitis. 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 No acute abnormality noted. Electronically Signed   By: MInez CatalinaM.D.   On: 09/21/2019 22:43    Procedures Procedures (including critical care time)  Medications Ordered in ED Medications  ondansetron ([facility] injection 4 mg (4 mg Intravenous Given 09/21/19 2122)  morphine 4 MG/ML injection 4 mg (4 mg Intravenous Given 09/21/19 2123)  sodium chloride 0.9 % bolus 1,000 mL (0 mLs Intravenous Stopped 09/21/19 2230)  iohexol (OMNIPAQUE) 300  MG/ML solution 100 mL (100 mLs Intravenous Contrast Given 09/21/19 2231)   HYDROmorphone (DILAUDID) injection 1 mg (1 mg Intravenous Given 09/21/19 2254)  sodium chloride 0.9 % bolus 1,000 mL (0 mLs Intravenous Stopped 09/22/19 0023)  haloperidol lactate (HALDOL) injection 2 mg (2 mg Intravenous Given 09/21/19 2339)    ED Course  I have reviewed the triage vital signs and the nursing notes.  Pertinent labs & imaging results that were available during my care of the patient were reviewed by me and considered in my medical decision making (see chart for details).  Clinical Course as of Sep 21 1029  Mon Sep 21, 2019  2257 Reviewed results with her including her CT results.  She said she is still nauseous and is asking for another dose of some nausea medication.  She is asking for me to also prescribe her Phenergan suppositories when she goes home.   [MB]  2302 Her symptoms may be exacerbated by her not taking her chronic narcotic pain medicine for the last 2 days because of the vomiting.   [MB]    Clinical Course User Index [MB] Hayden Rasmussen, MD   MDM Rules/Calculators/A&P                     This patient complains of abdominal pain nausea vomiting diarrhea; this involves an extensive number of treatment Options and is a complaint that carries with it a high risk of complications and Morbidity. The differential includes gastroenteritis, colitis, narcotic withdrawal, metabolic derangement, GI bleed, dehydration  I ordered, reviewed and interpreted labs, which included CBC with elevated white count, chemistry mildly elevated creatinine alk phos and bilirubin likely just due to dehydration. I ordered medication pain and nausea medication IV fluids I ordered imaging studies which included CT abdomen and pelvis and I independently    visualized and interpreted imaging which showed no acute findings to explain the patient's lower abdominal pain Additional history obtained from patient's husband Previous records obtained and reviewed in epic  After the  interventions stated above, I reevaluated the patient and found patient symptoms to be improving.  She is not on any immune suppressants so do not feel she needs admission currently.  She asked if I would prescribe some Phenergan suppositories which I have sent to her pharmacy.  Return instructions discussed.   Final Clinical Impression(s) / ED Diagnoses Final diagnoses:  Nausea vomiting and diarrhea  Lower abdominal pain    Rx / DC Orders ED Discharge Orders         Ordered    promethazine (PHENERGAN) 25 MG suppository  Every 6 hours PRN     09/21/19 2258           Hayden Rasmussen, MD 09/22/19 1034

## 2019-09-21 NOTE — Discharge Instructions (Signed)
You were seen in the emergency department for lower abdominal pain and nausea vomiting diarrhea.  You had blood work which showed an elevated infection cell count, it may be infection or just elevated with stress.  Your CAT scan did not show any serious findings.  Your symptoms improved with some pain medicine fluids and nausea medication.  Please contact your GI doctor for close follow-up.  Return to the emergency department if any worsening or concerning symptoms

## 2019-09-22 NOTE — ED Provider Notes (Signed)
Patient signed out pending urinalysis and fluids with repeat medications.  Urinalysis without evidence of infection.  Patient is able to tolerate fluids without difficulty now.  On recheck, she states she is ready to go home.  Will discharge per Dr. Carmie End discharge instructions.  Physical Exam  BP 125/66   Pulse 94   Temp 98 F (36.7 C)   Resp 14   Ht 1.549 m (5\' 1" )   Wt 49.9 kg   SpO2 93%   BMI 20.78 kg/m   Physical Exam Awake, alert, no acute distress Tolerating p.o. ED Course/Procedures   Clinical Course as of Sep 22 34  Mon Sep 21, 2019  2257 Reviewed results with her including her CT results.  She said she is still nauseous and is asking for another dose of some nausea medication.  She is asking for me to also prescribe her Phenergan suppositories when she goes home.   [MB]  2302 Her symptoms may be exacerbated by her not taking her chronic narcotic pain medicine for the last 2 days because of the vomiting.   [MB]    Clinical Course User Index [MB] Hayden Rasmussen, MD      Merryl Hacker, MD 09/22/19 [phone]

## 2019-09-24 LAB — URINE CULTURE: Culture: >=100000 — AB

## 2019-09-25 ENCOUNTER — Telehealth: Payer: Self-pay

## 2019-09-25 NOTE — Telephone Encounter (Signed)
No treatment for UC ED 09/22/19 per Alecia Lemming PA

## 2019-11-30 ENCOUNTER — Encounter: Payer: Self-pay | Admitting: Student in an Organized Health Care Education/Training Program

## 2019-12-01 ENCOUNTER — Ambulatory Visit
Payer: BC Managed Care – PPO | Attending: Student in an Organized Health Care Education/Training Program | Admitting: Student in an Organized Health Care Education/Training Program

## 2019-12-01 ENCOUNTER — Other Ambulatory Visit: Payer: Self-pay

## 2019-12-01 ENCOUNTER — Encounter: Payer: Self-pay | Admitting: Student in an Organized Health Care Education/Training Program

## 2019-12-01 ENCOUNTER — Telehealth: Payer: Self-pay | Admitting: *Deleted

## 2019-12-01 DIAGNOSIS — M5136 Other intervertebral disc degeneration, lumbar region: Secondary | ICD-10-CM

## 2019-12-01 DIAGNOSIS — Z79891 Long term (current) use of opiate analgesic: Secondary | ICD-10-CM | POA: Diagnosis not present

## 2019-12-01 DIAGNOSIS — G894 Chronic pain syndrome: Secondary | ICD-10-CM | POA: Diagnosis not present

## 2019-12-01 DIAGNOSIS — M7918 Myalgia, other site: Secondary | ICD-10-CM

## 2019-12-01 DIAGNOSIS — M47818 Spondylosis without myelopathy or radiculopathy, sacral and sacrococcygeal region: Secondary | ICD-10-CM

## 2019-12-01 DIAGNOSIS — M47816 Spondylosis without myelopathy or radiculopathy, lumbar region: Secondary | ICD-10-CM

## 2019-12-01 MED ORDER — OXYCODONE-ACETAMINOPHEN 5-325 MG PO TABS: 1.0000 | ORAL_TABLET | Freq: Three times a day (TID) | ORAL | 0 refills | Status: DC | PRN

## 2019-12-01 NOTE — Progress Notes (Signed)
Patient: [name]  Service Category: E/M  Provider: Gillis Santa, MD  DOB: [birth year]  DOS: 12/01/2019  Location: Office  MRN: [id number]  Setting: Ambulatory outpatient  Referring Provider: [facility]  Type: Established Patient  Specialty: Interventional Pain Management  PCP: [facility]  Location: Home  Delivery: TeleHealth     Virtual Encounter - Pain Management PROVIDER NOTE: Information contained herein reflects review and annotations entered in association with encounter. Interpretation of such information and data should be left to medically-trained personnel. Information provided to patient can be located elsewhere in the medical record under "Patient Instructions". Document created using STT-dictation technology, any transcriptional errors that may result from process are unintentional.    Contact & Pharmacy Preferred: [phone] Home: [phone] (home) Mobile: There is no such number on file (mobile). E-mail: No e-mail address on record  [place], [place] - [address] [place] [address] Phone: [phone] Fax: [phone]   Pre-screening  [name] offered "in-person" vs "virtual" encounter. She indicated preferring virtual for this encounter.   Reason COVID-19*  Social distancing based on CDC and AMA recommendations.   I contacted [name] on 12/01/2019 via video conference.      I clearly identified myself as Gillis Santa, MD. I verified that I was speaking with the correct person using two identifiers (Name: [name], and date of birth: December 23, 1970).  Consent I sought verbal advanced consent from [name] for virtual visit interactions. I informed [name] of possible security and privacy concerns, risks, and limitations associated with providing "not-in-person" medical evaluation and management services. I also informed [name] of the availability of  "in-person" appointments. Finally, I informed her that there would be a charge for the virtual visit and that she could be  personally, fully or partially, financially responsible for it. [name] expressed understanding and agreed to proceed.   Historic Elements   [name] is a 49 y.o. year old, female patient evaluated today after her last contact with our practice on 08/31/2019. [name]  has a past medical history of Chronic back pain, Hypertension, Migraine, and Plumbism. She also  has a past surgical history that includes Abdominal hysterectomy; Cholecystectomy; Hand surgery; and Back surgery. [name] has a current medication list which includes the following prescription(s): clonazepam, escitalopram, famotidine, hydroxyzine, metoprolol succinate, [START ON 12/05/2019] oxycodone-acetaminophen, [START ON 01/04/2020] oxycodone-acetaminophen, [START ON 02/03/2020] oxycodone-acetaminophen, and temazepam. She  reports that she has been smoking cigarettes. She has a 20.00 pack-year smoking history. She has never used smokeless tobacco. She reports that she does not drink alcohol and does not use drugs. [name] is allergic to buprenorphine, cefaclor, gabapentin, ibuprofen, levofloxacin, macrobid [nitrofurantoin monohyd macro], naproxen, and penicillins.   HPI  Today, she is being contacted for medication management.   No change in medical history other than she stopped Seroquel and was started on Lexapro 10 mg.  Continues oxycodone as prescribed and it states that it does help manage her pain and improve her functional status.  Unfortunately, [employer] denied her SI joint injection at the last visit likely because a physical exam was not documented.  I informed the patient of this.  Will send in refill for the next 3 months of oxycodone.  If patient would like to pursue SI joint injection, I informed her that she will need to come in for an appointment so that  I can perform a physical  exam to document SI joint arthropathy/dysfunction and then place order for injection which will hopefully be approved at that time.  I also informed the patient that we will need to repeat a urine toxicology screen that she [name] to complete in the next 2 weeks.  Pharmacotherapy Assessment  Analgesic: 11/06/2019  1   08/25/2019  Oxycodone-Acetaminophen 5-325  75.00  14 Un Pha   [id number]   Nor (6917)   0/0  40.18 MME  Comm Ins   [place]    Monitoring: [place] PMP: PDMP reviewed during this encounter.       Pharmacotherapy: No side-effects or adverse reactions reported. Compliance: No problems identified. Effectiveness: Clinically acceptable. Plan: Refer to "POC".  UDS:  Summary  Date Value Ref Range Status  02/18/2018 FINAL  Final    Comment:    ==================================================================== TOXASSURE SELECT 13 (MW) ==================================================================== Test                             Result       Flag       Units Drug Present and Declared for Prescription Verification   Oxazepam                       736          EXPECTED   ng/mg creat   Temazepam                      1561         EXPECTED   ng/mg creat    Oxazepam and temazepam are expected metabolites of diazepam.    Oxazepam is also an expected metabolite of other benzodiazepine    drugs, including chlordiazepoxide, prazepam, clorazepate,    halazepam, and temazepam.  Oxazepam and temazepam are available    as scheduled prescription medications.   Oxycodone                      1555         EXPECTED   ng/mg creat   Oxymorphone                    4264         EXPECTED   ng/mg creat   Noroxycodone                   5839         EXPECTED   ng/mg creat   Noroxymorphone                 2000         EXPECTED   ng/mg creat    Sources of oxycodone are scheduled prescription medications.    Oxymorphone, noroxycodone, and noroxymorphone are expected    metabolites of oxycodone. Oxymorphone is  also available as a    scheduled prescription medication. ==================================================================== Test                      Result    Flag   Units      Ref Range   Creatinine              33               mg/dL      >=20 ==================================================================== Declared Medications:  The flagging and interpretation on this report  are based on the  following declared medications.  Unexpected results may arise from  inaccuracies in the declared medications.  **Note: The testing scope of this panel includes these medications:  Oxycodone (Oxycodone Acetaminophen)  Temazepam (Restoril)  **Note: The testing scope of this panel does not include following  reported medications:  Acetaminophen  Acetaminophen (Oxycodone Acetaminophen)  Apixaban (Eliquis)  Metoprolol (Toprol)  Ranitidine ==================================================================== For clinical consultation, please call [phone]. ====================================================================     Laboratory Chemistry Profile   Renal Lab Results  Component Value Date   BUN 21 (H) 09/21/2019   CREATININE 1.08 (H) 09/21/2019   GFRAA >60 09/21/2019   GFRNONAA 60 (L) 09/21/2019     Hepatic Lab Results  Component Value Date   AST 23 09/21/2019   ALT 22 09/21/2019   ALBUMIN 4.6 09/21/2019   ALKPHOS 133 (H) 09/21/2019   LIPASE 33 09/21/2019     Electrolytes Lab Results  Component Value Date   NA 137 09/21/2019   K 3.8 09/21/2019   CL 95 (L) 09/21/2019   CALCIUM 10.1 09/21/2019     Bone No results found for: VD25OH, VD125OH2TOT, KX3818EX9, BZ1696VE9, 25OHVITD1, 25OHVITD2, 25OHVITD3, TESTOFREE, TESTOSTERONE   Inflammation (CRP: Acute Phase) (ESR: Chronic Phase) No results found for: CRP, ESRSEDRATE, LATICACIDVEN     Note: Above Lab results reviewed.   Imaging  CT Abdomen Pelvis W Contrast CLINICAL DATA:  Left lower quadrant pain for  2 days  EXAM: CT ABDOMEN AND PELVIS WITH CONTRAST  TECHNIQUE: Multidetector CT imaging of the abdomen and pelvis was performed using the standard protocol following bolus administration of intravenous contrast.  CONTRAST:  180m OMNIPAQUE IOHEXOL 300 MG/ML  SOLN  COMPARISON:  12/01/2018  FINDINGS: Lower chest: No acute abnormality.  Hepatobiliary: No focal liver abnormality is seen. Status post cholecystectomy. No biliary dilatation.  Pancreas: Unremarkable. No pancreatic ductal dilatation or surrounding inflammatory changes.  Spleen: Normal in size without focal abnormality.  Adrenals/Urinary Tract: Adrenal glands are stable. Kidneys are well visualized bilaterally. No renal calculi or obstructive changes are seen. Multi septated cyst is noted in the upper pole of the left kidney stable in appearance from the prior CT examination. It measures approximately 5.1 cm. No obstructive changes are seen. The bladder is well distended.  Stomach/Bowel: Scattered diverticular change of the colon is noted without evidence of diverticulitis. The appendix is within normal limits. No obstructive or inflammatory changes of the colon are seen. The stomach and small bowel appear within normal limits.  Vascular/Lymphatic: Aortic atherosclerosis. No enlarged abdominal or pelvic lymph nodes.  Reproductive: Status post hysterectomy. No adnexal masses.  Other: No abdominal wall hernia or abnormality. No abdominopelvic ascites.  Musculoskeletal: No acute or significant osseous findings.  IMPRESSION: Diverticulosis without diverticulitis.  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  No acute abnormality noted.  Electronically Signed   By: MInez CatalinaM.D.   On: 09/21/2019 22:43  Assessment  The primary encounter diagnosis was Chronic pain syndrome. Diagnoses of SI joint arthritis, Myofascial pain, Long term  prescription opiate use, Lumbar spondylosis, and Lumbar degenerative disc disease were also pertinent to this visit.  Plan of Care   Ms. GTAJANA CROTTEAUhas a current medication list which includes the following long-term medication(s): clonazepam, escitalopram, famotidine, metoprolol succinate, and temazepam.  Pharmacotherapy (Medications Ordered): Meds ordered this encounter  Medications  . oxyCODONE-acetaminophen (PERCOCET) 5-325 MG tablet    Sig: Take 1-2 tablets by  mouth every 8 (eight) hours as needed for severe pain.    Dispense:  75 tablet    Refill:  0    Do not place this medication, or any other prescription from our practice, on "Automatic Refill". Patient may have prescription filled one day early if pharmacy is closed on scheduled refill date.  Marland Kitchen oxyCODONE-acetaminophen (PERCOCET) 5-325 MG tablet    Sig: Take 1-2 tablets by mouth every 8 (eight) hours as needed for severe pain.    Dispense:  75 tablet    Refill:  0    Do not place this medication, or any other prescription from our practice, on "Automatic Refill". Patient may have prescription filled one day early if pharmacy is closed on scheduled refill date.  Marland Kitchen oxyCODONE-acetaminophen (PERCOCET) 5-325 MG tablet    Sig: Take 1-2 tablets by mouth every 8 (eight) hours as needed for severe pain.    Dispense:  75 tablet    Refill:  0    Do not place this medication, or any other prescription from our practice, on "Automatic Refill". Patient may have prescription filled one day early if pharmacy is closed on scheduled refill date.   Follow-up plan:   Return in about 3 months (around 03/02/2020) for Medication Management, in person.   Recent Visits No visits were found meeting these conditions. Showing recent visits within past 90 days and meeting all other requirements Today's Visits Date Type Provider Dept  12/01/19 Telemedicine Gillis Santa, MD [facility]  Showing today's visits and meeting all other  requirements Future Appointments No visits were found meeting these conditions. Showing future appointments within next 90 days and meeting all other requirements  I discussed the assessment and treatment plan with the patient. The patient was provided an opportunity to ask questions and all were answered. The patient agreed with the plan and demonstrated an understanding of the instructions.  Patient advised to call back or seek an in-person evaluation if the symptoms or condition worsens.  Duration of encounter: 30 minutes.  Note by: Gillis Santa, MD Date: 12/01/2019; Time: 3:07 PM

## 2019-12-16 ENCOUNTER — Telehealth: Payer: Self-pay

## 2019-12-16 NOTE — Telephone Encounter (Signed)
She wants to try again to get the [facility] authorized. Is this ok by you?

## 2019-12-17 NOTE — Telephone Encounter (Signed)
Tried calling patient back, no answer no vm available.

## 2019-12-19 LAB — TOXASSURE SELECT 13 (MW), URINE

## 2019-12-21 ENCOUNTER — Ambulatory Visit: Payer: BC Managed Care – PPO | Admitting: Student in an Organized Health Care Education/Training Program

## 2019-12-27 ENCOUNTER — Other Ambulatory Visit: Payer: Self-pay

## 2019-12-27 DIAGNOSIS — Z79899 Other long term (current) drug therapy: Secondary | ICD-10-CM | POA: Diagnosis not present

## 2019-12-27 DIAGNOSIS — R519 Headache, unspecified: Secondary | ICD-10-CM | POA: Insufficient documentation

## 2019-12-27 DIAGNOSIS — R112 Nausea with vomiting, unspecified: Secondary | ICD-10-CM | POA: Diagnosis not present

## 2019-12-27 DIAGNOSIS — M549 Dorsalgia, unspecified: Secondary | ICD-10-CM | POA: Insufficient documentation

## 2019-12-27 DIAGNOSIS — F17210 Nicotine dependence, cigarettes, uncomplicated: Secondary | ICD-10-CM | POA: Insufficient documentation

## 2019-12-27 DIAGNOSIS — R1013 Epigastric pain: Secondary | ICD-10-CM | POA: Diagnosis present

## 2019-12-27 DIAGNOSIS — I10 Essential (primary) hypertension: Secondary | ICD-10-CM | POA: Insufficient documentation

## 2019-12-28 ENCOUNTER — Emergency Department (HOSPITAL_COMMUNITY)
Admission: EM | Admit: 2019-12-28 | Discharge: 2019-12-28 | Disposition: A | Discharge status: Home / Self Care | Payer: BC Managed Care – PPO | Attending: Emergency Medicine | Admitting: Emergency Medicine

## 2019-12-28 ENCOUNTER — Other Ambulatory Visit: Payer: Self-pay

## 2019-12-28 ENCOUNTER — Emergency Department (HOSPITAL_COMMUNITY): Payer: BC Managed Care – PPO

## 2019-12-28 ENCOUNTER — Encounter (HOSPITAL_COMMUNITY): Payer: Self-pay | Admitting: Emergency Medicine

## 2019-12-28 DIAGNOSIS — R1010 Upper abdominal pain, unspecified: Secondary | ICD-10-CM

## 2019-12-28 DIAGNOSIS — R112 Nausea with vomiting, unspecified: Secondary | ICD-10-CM

## 2019-12-28 LAB — COMPREHENSIVE METABOLIC PANEL
ALT: 19 U/L (ref 0–44)
AST: 33 U/L (ref 15–41)
Albumin: 4.7 g/dL (ref 3.5–5.0)
Alkaline Phosphatase: 123 U/L (ref 38–126)
Anion gap: 14 (ref 5–15)
BUN: 7 mg/dL (ref 6–20)
CO2: 25 mmol/L (ref 22–32)
Calcium: 9.3 mg/dL (ref 8.9–10.3)
Chloride: 101 mmol/L (ref 98–111)
Creatinine, Ser: 0.68 mg/dL (ref 0.44–1.00)
GFR calc Af Amer: >60 mL/min (ref >60)
GFR calc non Af Amer: >60 mL/min (ref >60)
Glucose, Bld: 106 mg/dL — ABNORMAL HIGH (ref 70–99)
Potassium: 3.2 mmol/L — ABNORMAL LOW (ref 3.5–5.1)
Sodium: 140 mmol/L (ref 135–145)
Total Bilirubin: 0.2 mg/dL — ABNORMAL LOW (ref 0.3–1.2)
Total Protein: 8.9 g/dL — ABNORMAL HIGH (ref 6.5–8.1)

## 2019-12-28 LAB — CBC WITH DIFFERENTIAL/PLATELET
Abs Immature Granulocytes: 0.05 10*3/uL (ref 0.00–0.07)
Basophils Absolute: 0.1 10*3/uL (ref 0.0–0.1)
Basophils Relative: 1 %
Eosinophils Absolute: 0 10*3/uL (ref 0.0–0.5)
Eosinophils Relative: 0 %
HCT: 49.1 % — ABNORMAL HIGH (ref 36.0–46.0)
Hemoglobin: 16.2 g/dL — ABNORMAL HIGH (ref 12.0–15.0)
Immature Granulocytes: 1 %
Lymphocytes Relative: 17 %
Lymphs Abs: 1.2 10*3/uL (ref 0.7–4.0)
MCH: 31.7 pg (ref 26.0–34.0)
MCHC: 33 g/dL (ref 30.0–36.0)
MCV: 96.1 fL (ref 80.0–100.0)
Monocytes Absolute: 0.2 10*3/uL (ref 0.1–1.0)
Monocytes Relative: 3 %
Neutro Abs: 5.6 10*3/uL (ref 1.7–7.7)
Neutrophils Relative %: 78 %
Platelets: 401 10*3/uL — ABNORMAL HIGH (ref 150–400)
RBC: 5.11 MIL/uL (ref 3.87–5.11)
RDW: 15.6 % — ABNORMAL HIGH (ref 11.5–15.5)
WBC: 7.2 10*3/uL (ref 4.0–10.5)
nRBC: 0 % (ref 0.0–0.2)

## 2019-12-28 LAB — LIPASE, BLOOD: Lipase: 43 U/L (ref 11–51)

## 2019-12-28 LAB — TROPONIN I (HIGH SENSITIVITY)
Troponin I (High Sensitivity): 5 ng/L (ref <18)
Troponin I (High Sensitivity): 5 ng/L (ref <18)

## 2019-12-28 IMAGING — CT CT ABD-PELV W/ CM
2 of 5 series · 16 of 46 positions shown, 18 images · IV contrast (Omnipaque or Isovue)
Comparison: [DATE]

CLINICAL DATA: Acute nonlocalized abdominal pain

EXAM:
CT ABDOMEN AND PELVIS WITH CONTRAST
TECHNIQUE: Multidetector CT imaging of the abdomen and pelvis was performed
using the standard protocol following bolus administration of
intravenous contrast.
CONTRAST:  100mL OMNIPAQUE IOHEXOL 300 MG/ML  SOLN

[Series 2: axial st · axial · 0.69mm/px · z∈[-391,-1]mm · 13 of 88 slices shown, 15 images]
[im 5/88  soft-tissue]
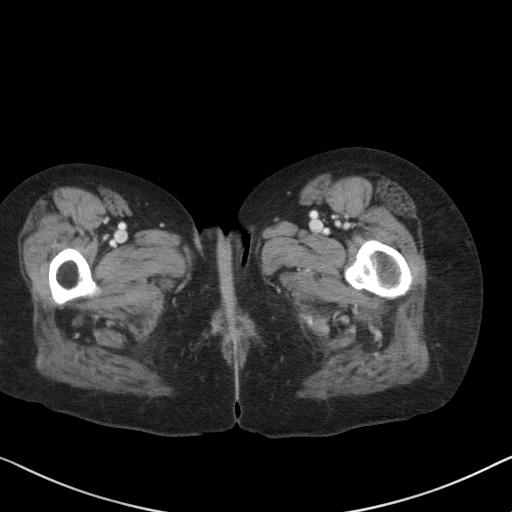
[im 5/88  bone]
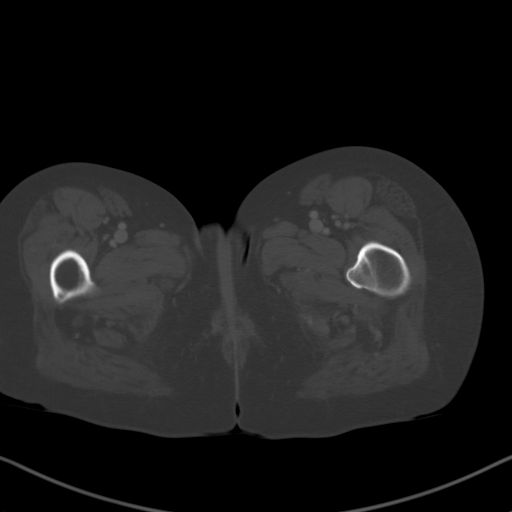
[im 10/88  soft-tissue]
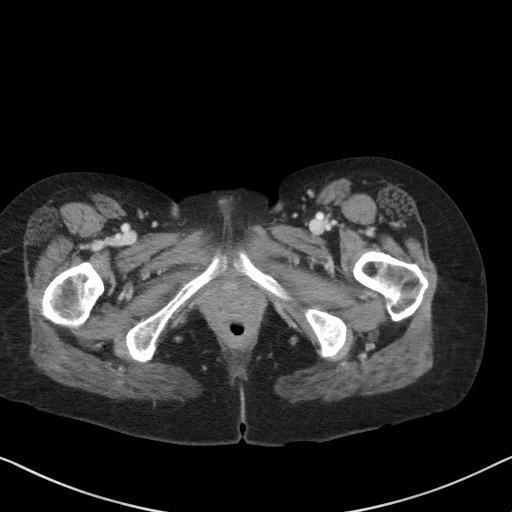
[im 20/88  soft-tissue]
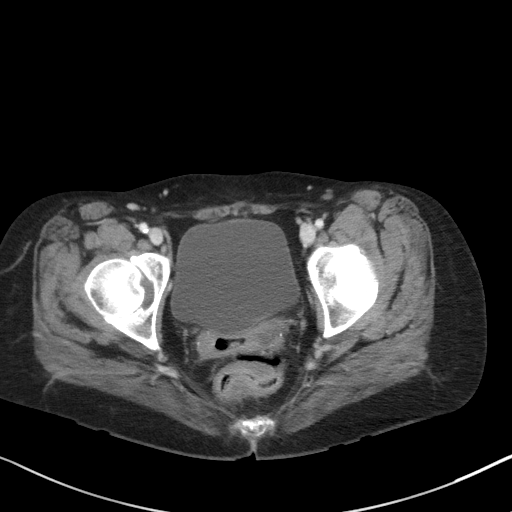
[im 25/88  soft-tissue]
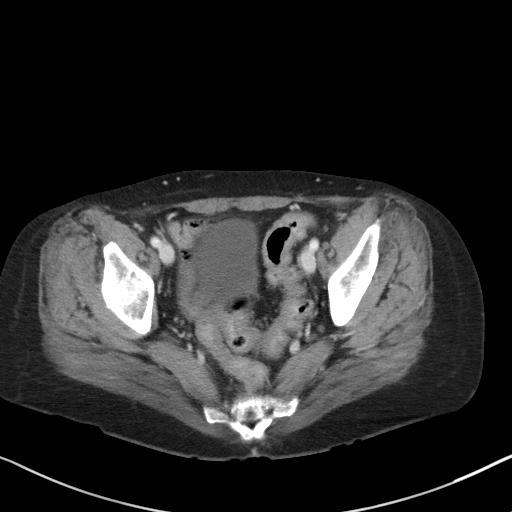
[im 30/88  soft-tissue]
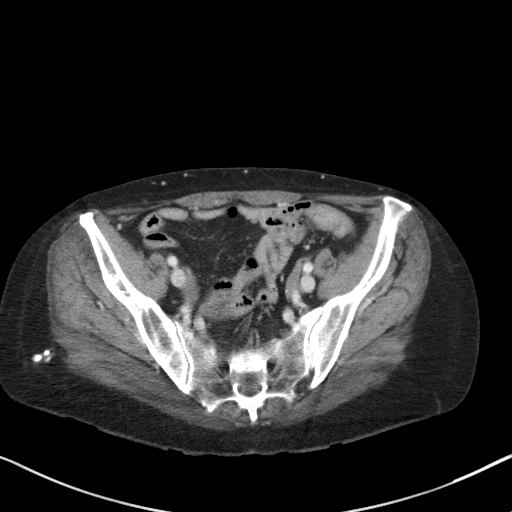
[im 39/88  soft-tissue]
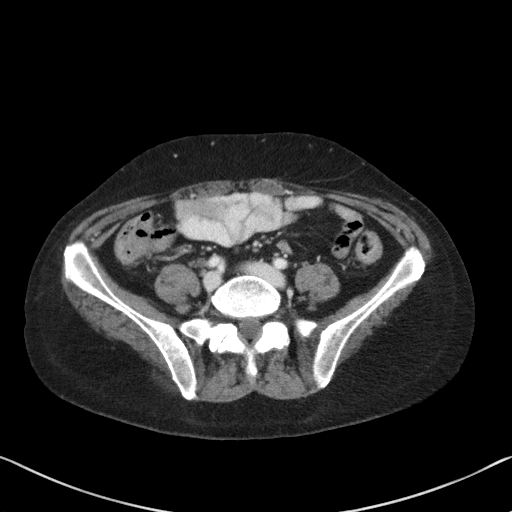
[im 44/88  soft-tissue]
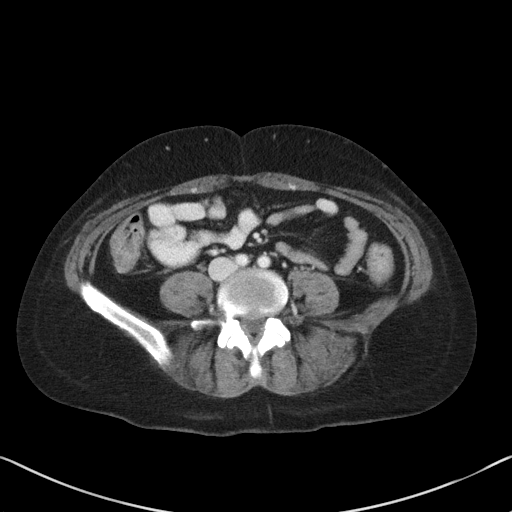
[im 49/88  soft-tissue]
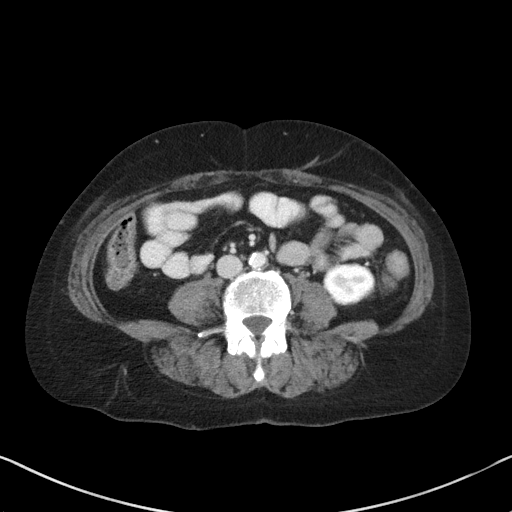
[im 59/88  soft-tissue]
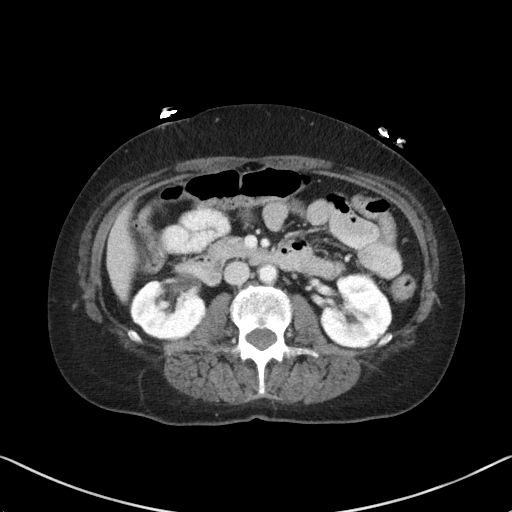
[im 59/88  bone]
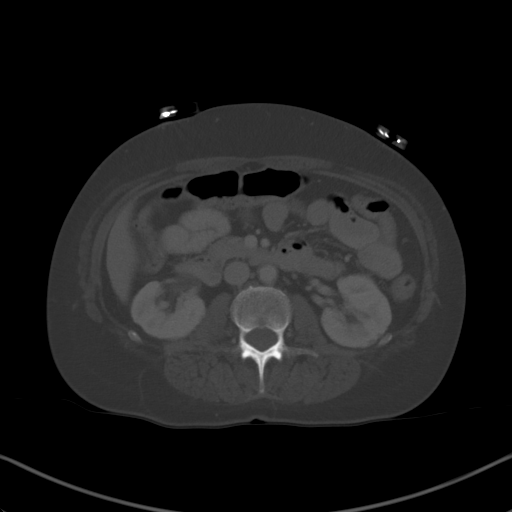
[im 63/88  soft-tissue]
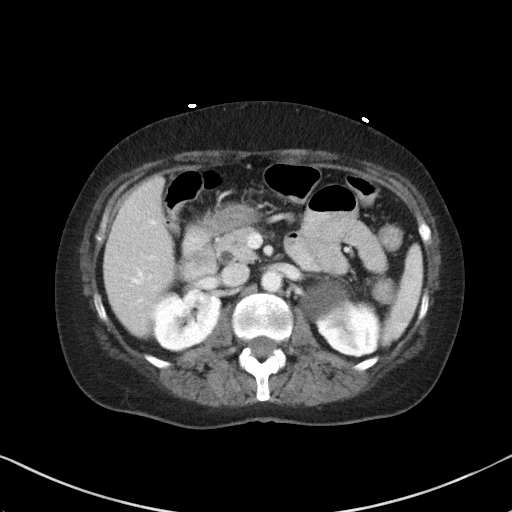
[im 68/88  soft-tissue]
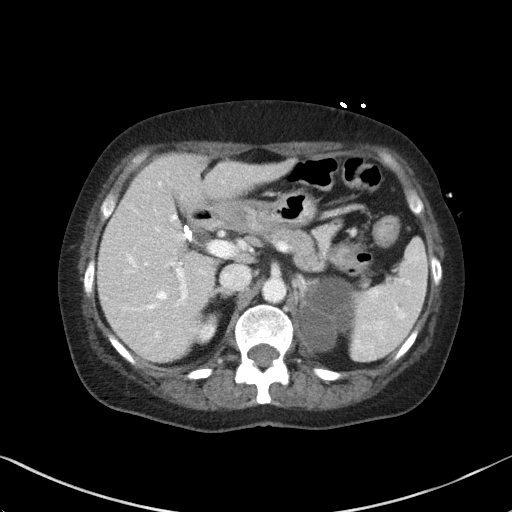
[im 78/88  soft-tissue]
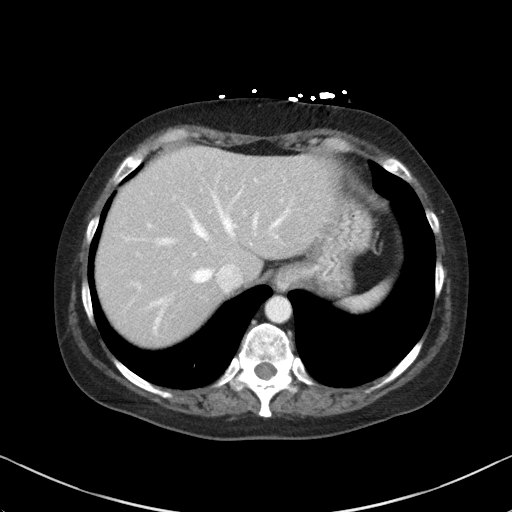
[im 83/88  soft-tissue]
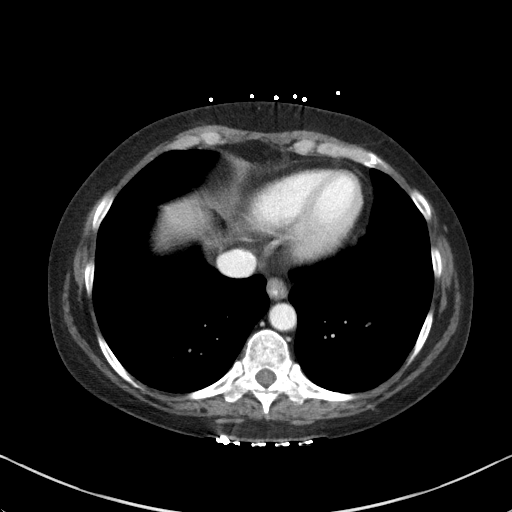

[Series 5: coronal st · coronal · 0.64mm/px · 3 of 84 slices shown]
[im 28/84  soft-tissue]
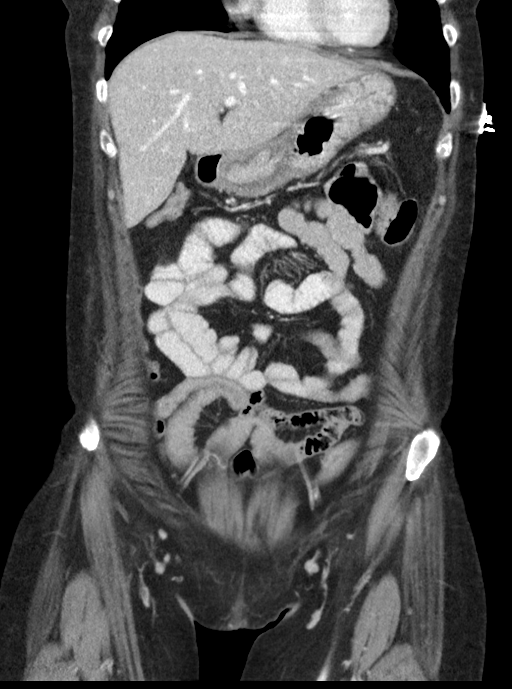
[im 37/84  soft-tissue]
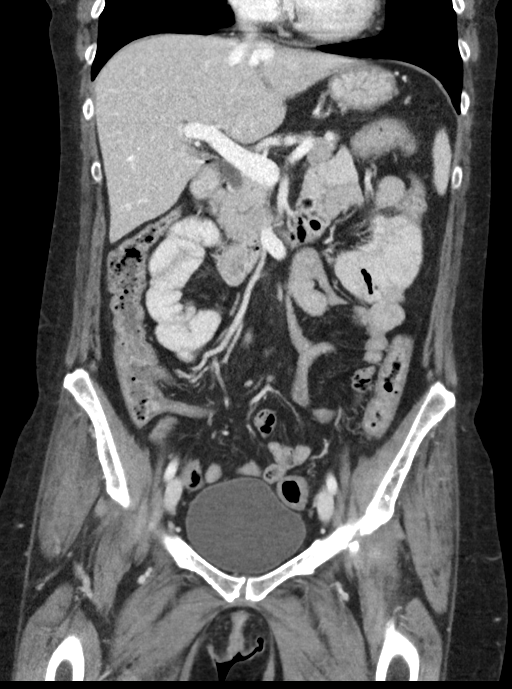
[im 47/84  soft-tissue]
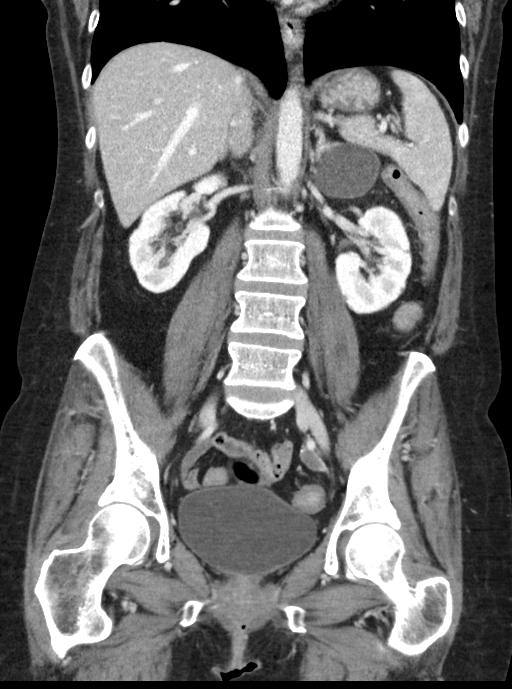

[16 of 46 positions shown; findings below may reference images not displayed]

FINDINGS: Lower chest:  No contributory findings.

Hepatobiliary: No focal liver abnormality.Cholecystectomy. No bile
duct dilatation.

Pancreas: Unremarkable.

Spleen: Unremarkable.

Adrenals/Urinary Tract: Negative adrenals. No hydronephrosis or
stone. Patchy renal cortical thinning/scarring. Multi septated
exophytic left upper pole cyst measuring up to 5.4 cm, dimensions
unchanged from most recent prior. There are other and much smaller
cystic densities in the left renal cortex. Unremarkable bladder.

Stomach/Bowel: No obstruction. 6-7 mm diameter appendix without
regional inflammation and stable from prior.

Vascular/Lymphatic: No acute vascular abnormality. Scattered
atheromatous calcifications of the aorta, notable for age. No mass
or adenopathy.

Reproductive:Hysterectomy.

Other: No ascites or pneumoperitoneum.

Musculoskeletal: No acute abnormalities.
IMPRESSION: 1. No acute finding.
2. Multi septated left upper pole renal cyst, recommend six-month
imaging follow-up. MRI would be the most definitive modality but
ultrasound would also be reasonable.

## 2019-12-28 IMAGING — CT CT HEAD W/O CM
3 series · 16 of 47 positions shown, 19 images · non-contrast
Comparison: None.

CLINICAL DATA: Headache

EXAM:
CT HEAD WITHOUT CONTRAST
TECHNIQUE: Contiguous axial images were obtained from the base of the skull
through the vertex without intravenous contrast.

[Series 2: head w o · axial · 0.40mm/px · z∈[+320,+445]mm · 10 of 31 slices shown, 13 images]
[im 3/31  brain]
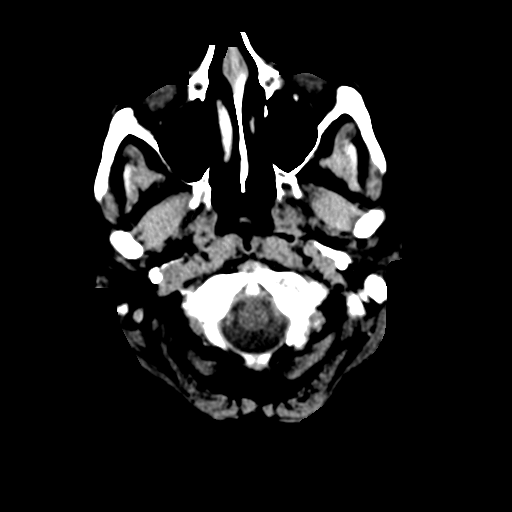
[im 3/31  bone]
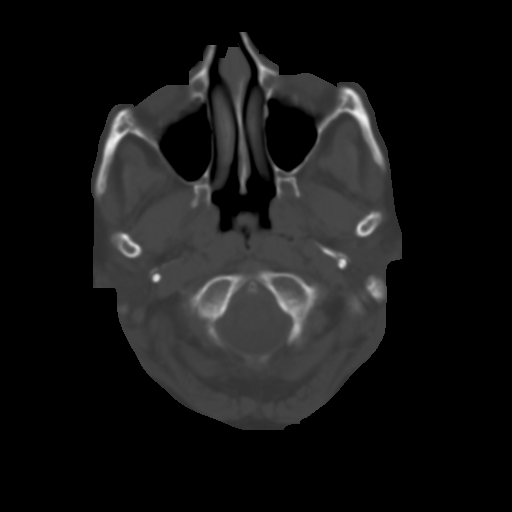
[im 6/31  brain]
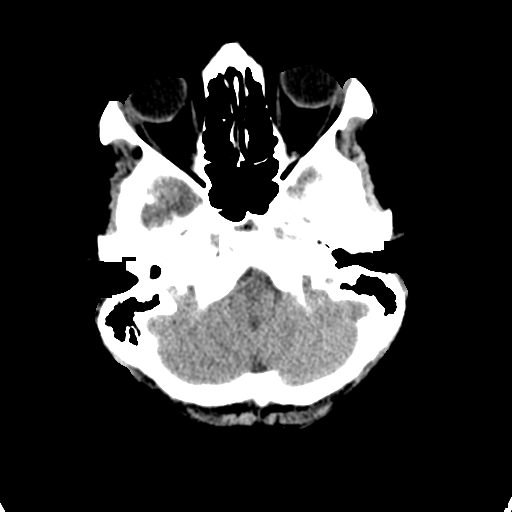
[im 9/31  brain]
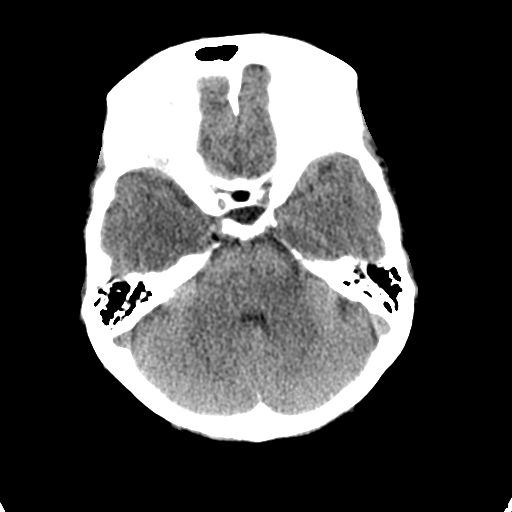
[im 11/31  brain]
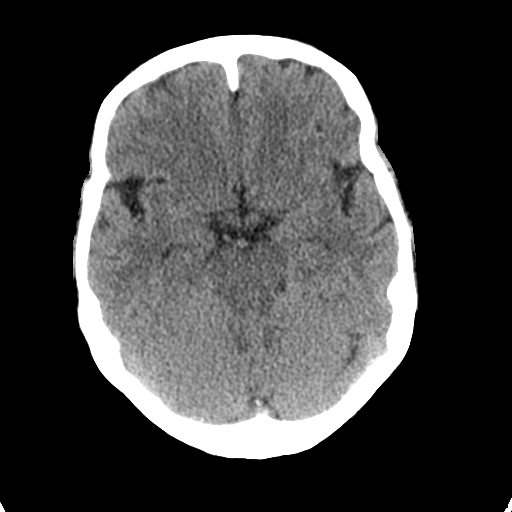
[im 14/31  brain]
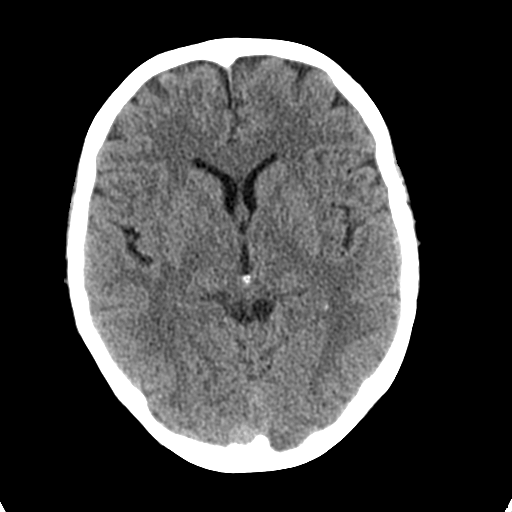
[im 14/31  bone]
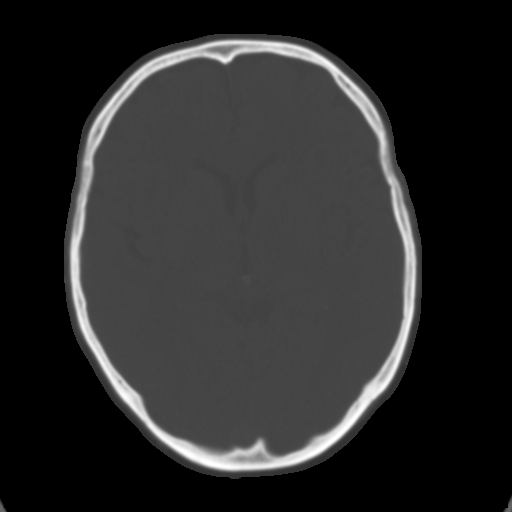
[im 17/31  brain]
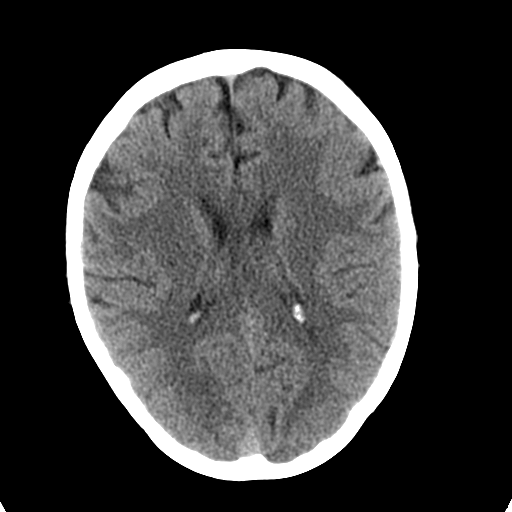
[im 20/31  brain]
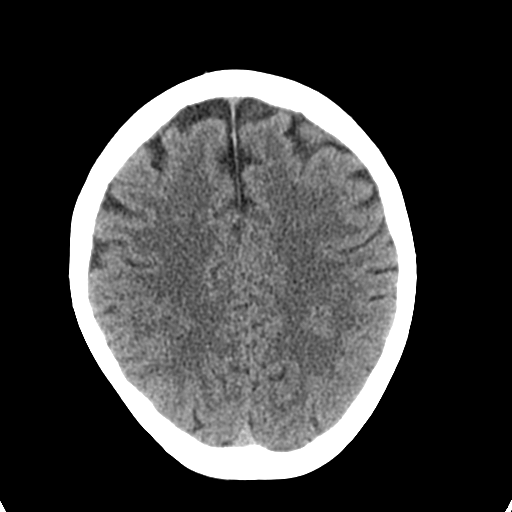
[im 23/31  brain]
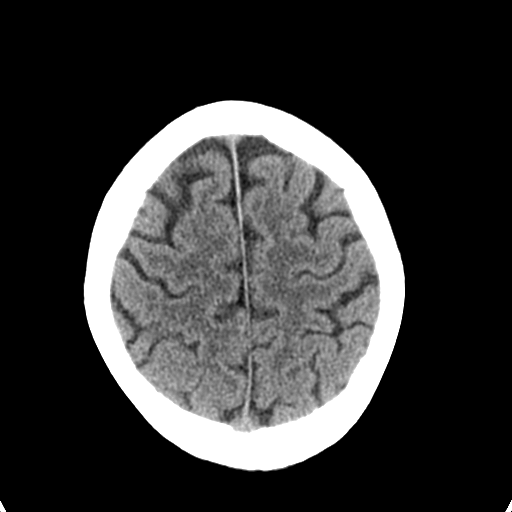
[im 25/31  brain]
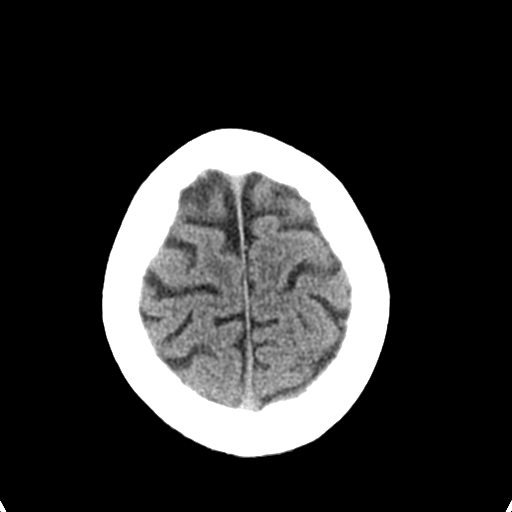
[im 25/31  bone]
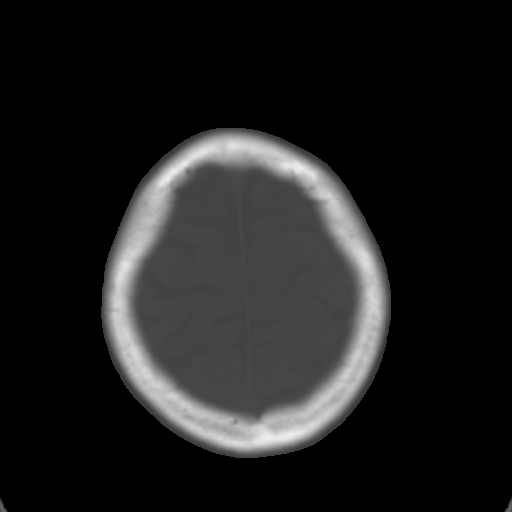
[im 28/31  brain]
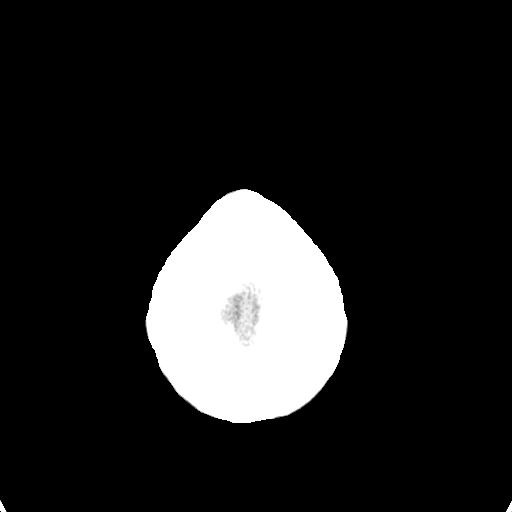

[Series 4: coronal soft · coronal · 0.34mm/px · 3 of 68 slices shown]
[im 23/68  brain]
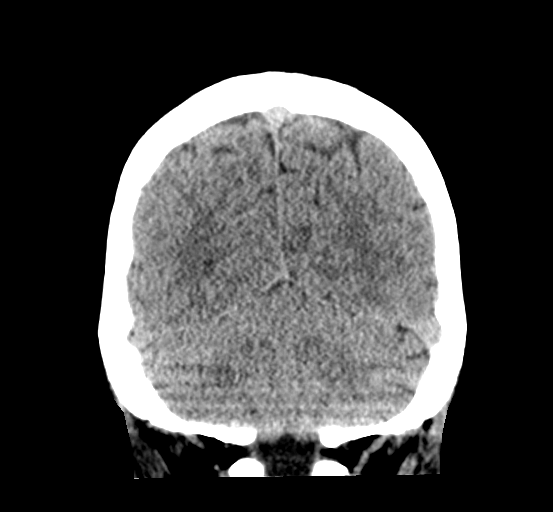
[im 30/68  brain]
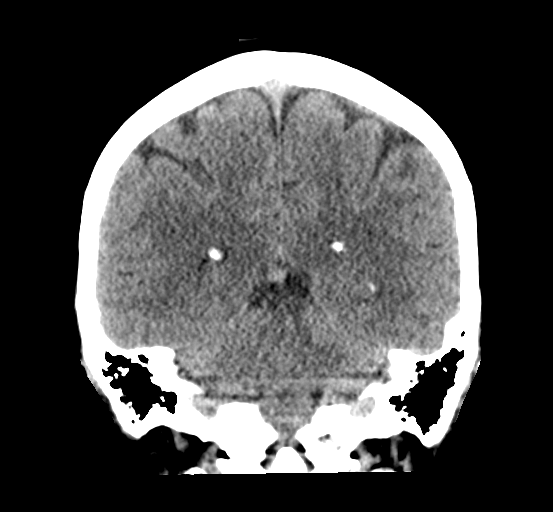
[im 38/68  brain]
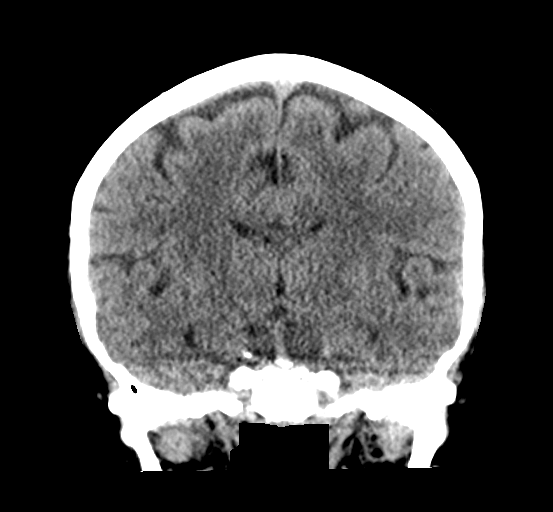

[Series 5: sagittal soft · sagittal · 0.32mm/px · 3 of 56 slices shown]
[im 19/56  brain]
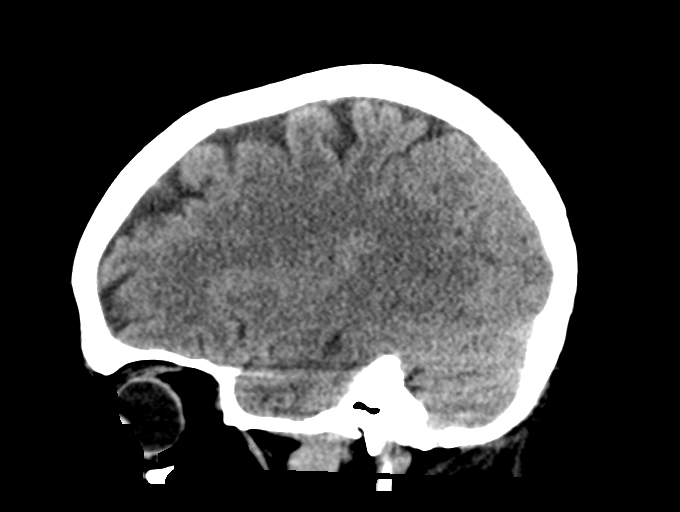
[im 28/56  brain]
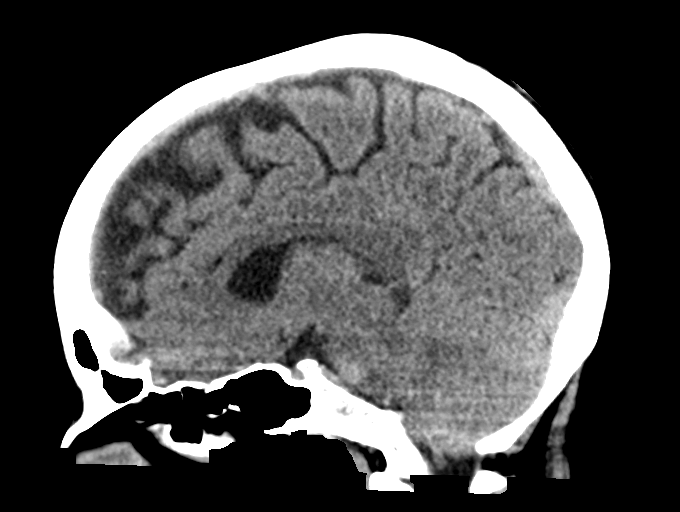
[im 37/56  brain]
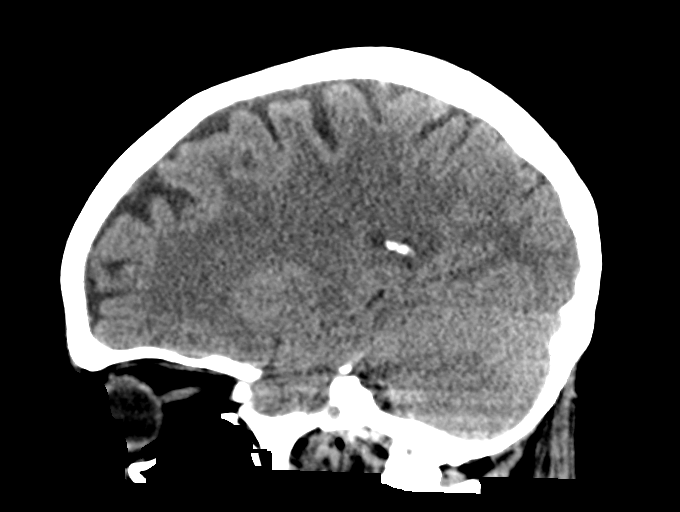

[16 of 47 positions shown; findings below may reference images not displayed]

FINDINGS: Brain: No acute intracranial hemorrhage. No focal mass lesion. No CT
evidence of acute infarction. No midline shift or mass effect. No
hydrocephalus. Basilar cisterns are patent.

Vascular: No hyperdense vessel or unexpected calcification.

Skull: Normal. Negative for fracture or focal lesion.

Sinuses/Orbits: Paranasal sinuses and mastoid air cells are clear.
Orbits are clear.

Other: None.
IMPRESSION: No acute intracranial findings.

## 2019-12-28 MED ORDER — LIDOCAINE VISCOUS HCL 2 % MT SOLN
15.0000 mL | Freq: Once | OROMUCOSAL | Status: AC
  Administered 2019-12-28: 15 mL via ORAL
  Filled 2019-12-28: qty 15

## 2019-12-28 MED ORDER — IOHEXOL 300 MG/ML  SOLN
100.0000 mL | Freq: Once | INTRAMUSCULAR | Status: AC | PRN
  Administered 2019-12-28: 100 mL via INTRAVENOUS

## 2019-12-28 MED ORDER — PROMETHAZINE HCL 25 MG/ML IJ SOLN
25.0000 mg | Freq: Once | INTRAMUSCULAR | Status: AC
  Administered 2019-12-28: 25 mg via INTRAVENOUS
  Filled 2019-12-28: qty 1

## 2019-12-28 MED ORDER — KETAMINE HCL 10 MG/ML IJ SOLN
0.3000 mg/kg | Freq: Once | INTRAMUSCULAR | Status: AC
  Administered 2019-12-28: 16 mg via INTRAVENOUS
  Filled 2019-12-28: qty 1

## 2019-12-28 MED ORDER — ALUM & MAG HYDROXIDE-SIMETH 200-200-20 MG/5ML PO SUSP
30.0000 mL | Freq: Once | ORAL | Status: AC
  Administered 2019-12-28: 30 mL via ORAL
  Filled 2019-12-28: qty 30

## 2019-12-28 MED ORDER — IOHEXOL 9 MG/ML PO SOLN
ORAL | Status: AC
  Filled 2019-12-28: qty 1000

## 2019-12-28 MED ORDER — LORAZEPAM 2 MG/ML IJ SOLN
1.0000 mg | Freq: Once | INTRAMUSCULAR | Status: AC
  Administered 2019-12-28: 1 mg via INTRAVENOUS
  Filled 2019-12-28: qty 1

## 2019-12-28 MED ORDER — SODIUM CHLORIDE 0.9 % IV BOLUS
1000.0000 mL | Freq: Once | INTRAVENOUS | Status: AC
  Administered 2019-12-28: 1000 mL via INTRAVENOUS

## 2019-12-28 MED ORDER — HYDROMORPHONE HCL 1 MG/ML IJ SOLN
1.0000 mg | Freq: Once | INTRAMUSCULAR | Status: AC
  Administered 2019-12-28: 1 mg via INTRAVENOUS
  Filled 2019-12-28: qty 1

## 2019-12-28 MED ORDER — ONDANSETRON 4 MG PO TBDP
4.0000 mg | ORAL_TABLET | Freq: Three times a day (TID) | ORAL | 0 refills | Status: DC | PRN
Start: 2019-12-28 — End: 2020-03-01

## 2019-12-28 MED ORDER — ONDANSETRON HCL 4 MG/2ML IJ SOLN
4.0000 mg | Freq: Once | INTRAMUSCULAR | Status: AC
  Administered 2019-12-28: 4 mg via INTRAVENOUS
  Filled 2019-12-28: qty 2

## 2019-12-28 MED ORDER — OMEPRAZOLE 20 MG PO CPDR
20.0000 mg | DELAYED_RELEASE_CAPSULE | Freq: Every day | ORAL | 0 refills | Status: DC
Start: 2019-12-28 — End: 2020-08-09

## 2019-12-28 NOTE — ED Provider Notes (Signed)
[facility] EMERGENCY DEPARTMENT Provider Note   CSN: [id number] Arrival date & time: 12/27/19  2324     History Chief Complaint  Patient presents with  . Abdominal Pain    [name] is a 49 y.o. female.  Patient with history of hypertension, chronic pain, "colitis" and migraine headaches presenting with upper abdominal pain with nausea and vomiting since this afternoon.  Reports epigastric pain radiating across her entire upper stomach with multiple episodes of nausea and vomiting.  Believes she has vomited about 10 times.  Denies any diarrhea.  Last episode of bowel movement was yesterday and normal.  No fever.  No chest pain or shortness of breath.  No pain with urination or blood in the urine.  States she takes Percocet chronically for her back but is not able to keep anything down today.  She has a gradual onset headache she believes her elevated blood pressure.  Denies thunderclap onset.  History of cholecystectomy in the past.  She also has a history of acid reflux and takes Pepcid.  Denies any black or bloody stools.  Denies any pain with urination or blood in the urine.  States her back pain is stable at baseline.  The history is provided by the patient.       Past Medical History:  Diagnosis Date  . Chronic back pain   . Hypertension   . Migraine   . Plumbism    blood clot     Patient Active Problem List   Diagnosis Date Noted  . 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 Long term prescription opiate use 05/19/2018  . Lumbar radiculopathy 02/04/2017  . Lumbar degenerative disc disease 02/04/2017  . Lumbar spondylosis 02/04/2017  . Chronic low back pain with bilateral sciatica 02/04/2017  . Chronic pain syndrome 02/04/2017  . Lumbar sprain 02/04/2017  . Chronic migraine without aura without status migrainosus, not intractable 02/04/2017    Past Surgical History:  Procedure Laterality Date  . ABDOMINAL HYSTERECTOMY    . BACK SURGERY    . CHOLECYSTECTOMY    . HAND  SURGERY       OB History    Gravida  2   Para  2   Term  2   Preterm      AB      Living  2     SAB      TAB      Ectopic      Multiple      Live Births              Family History  Problem Relation Age of Onset  . Cancer Other   . Seizures Other   . Stroke Other   . Diabetes Other     Social History   Tobacco Use  . Smoking status: Current Every Day Smoker    Packs/day: 1.00    Years: 20.00    Pack years: 20.00    Types: Cigarettes  . Smokeless tobacco: Never Used  Vaping Use  . Vaping Use: Never used  Substance Use Topics  . Alcohol use: No  . Drug use: No    Home Medications Prior to Admission medications   Medication Sig Start Date End Date Taking? Authorizing Provider  clonazePAM (KLONOPIN) 0.5 MG tablet Take 0.5 mg by mouth daily as needed for anxiety.  09/12/19   [provider]  escitalopram (LEXAPRO) 10 MG tablet Take 10 mg by mouth daily.    [provider]  famotidine (PEPCID) 20 MG tablet Take 20 mg by mouth 2 (two) times daily.    [provider]  hydrOXYzine (ATARAX/VISTARIL) 25 MG tablet Take 25 mg by mouth 3 (three) times daily as needed for anxiety.  03/04/19   [provider]  metoprolol succinate (TOPROL-XL) 100 MG 24 hr tablet Take 100 mg by mouth in the morning. Take with or immediately following a meal.     [provider]  oxyCODONE-acetaminophen (PERCOCET) 5-325 MG tablet Take 1-2 tablets by mouth every 8 (eight) hours as needed for severe pain. 12/05/19 01/04/20  Gillis Santa, MD  oxyCODONE-acetaminophen (PERCOCET) 5-325 MG tablet Take 1-2 tablets by mouth every 8 (eight) hours as needed for severe pain. 01/04/20 02/03/20  Gillis Santa, MD  oxyCODONE-acetaminophen (PERCOCET) 5-325 MG tablet Take 1-2 tablets by mouth every 8 (eight) hours as needed for severe pain. 02/03/20 03/04/20  Gillis Santa, MD  temazepam (RESTORIL) 30 MG capsule Take 30 mg by mouth at bedtime as needed for sleep.   01/17/16   [provider]    Allergies    Buprenorphine, Cefaclor, Gabapentin, Ibuprofen, Levofloxacin, Macrobid [nitrofurantoin monohyd macro], Naproxen, and Penicillins  Review of Systems   Review of Systems  Constitutional: Positive for activity change and appetite change. Negative for fatigue and fever.  HENT: Negative for congestion.   Eyes: Negative for visual disturbance.  Respiratory: Negative for cough, chest tightness and shortness of breath.   Cardiovascular: Negative for chest pain.  Gastrointestinal: Positive for abdominal pain, nausea and vomiting.  Genitourinary: Negative for dysuria and hematuria.  Musculoskeletal: Positive for back pain. Negative for arthralgias and myalgias.  Skin: Negative for rash.  Neurological: Positive for headaches. Negative for dizziness and weakness.   all other systems are negative except as noted in the HPI and PMH.   Physical Exam Updated Vital Signs BP (!) 172/105 (BP Location: Right Arm)   Pulse 89   Temp 98.5 F (36.9 C) (Oral)   Resp 18   Ht 5\' 1"  (1.549 m)   Wt 54.4 kg   SpO2 100%   BMI 22.67 kg/m   Physical Exam Vitals and nursing note reviewed.  Constitutional:      General: She is not in acute distress.    Appearance: Normal appearance. She is well-developed and normal weight.  HENT:     Head: Normocephalic and atraumatic.     Mouth/Throat:     Pharynx: No oropharyngeal exudate.  Eyes:     Conjunctiva/sclera: Conjunctivae normal.     Pupils: Pupils are equal, round, and reactive to light.  Neck:     Comments: No meningismus. Cardiovascular:     Rate and Rhythm: Normal rate and regular rhythm.     Heart sounds: Normal heart sounds. No murmur heard.   Pulmonary:     Effort: Pulmonary effort is normal. No respiratory distress.     Breath sounds: Normal breath sounds.  Abdominal:     Palpations: Abdomen is soft.     Tenderness: There is abdominal tenderness. There is guarding. There is no rebound.      Comments: Epigastric tenderness with diffuse tenderness across upper abdomen, no guarding or rebound  Musculoskeletal:        General: Tenderness present. Normal range of motion.     Cervical back: Normal range of motion and neck supple.     Comments: Paraspinal lumbar tenderness bilaterally  Skin:    General: Skin is warm.     Capillary Refill: Capillary refill takes less than  2 seconds.  Neurological:     General: No focal deficit present.     Mental Status: She is alert and oriented to person, place, and time. Mental status is at baseline.     Cranial Nerves: No cranial nerve deficit.     Motor: No abnormal muscle tone.     Coordination: Coordination normal.     Comments:  5/5 strength throughout. CN 2-12 intact.Equal grip strength.   Psychiatric:        Behavior: Behavior normal.     ED Results / Procedures / Treatments   Labs (all labs ordered are listed, but only abnormal results are displayed) Labs Reviewed  CBC WITH DIFFERENTIAL/PLATELET - Abnormal; Notable for the following components:      Result Value   Hemoglobin 16.2 (*)    HCT 49.1 (*)    RDW 15.6 (*)    Platelets 401 (*)    All other components within normal limits  COMPREHENSIVE METABOLIC PANEL - Abnormal; Notable for the following components:   Potassium 3.2 (*)    Glucose, Bld 106 (*)    Total Protein 8.9 (*)    Total Bilirubin 0.2 (*)    All other components within normal limits  LIPASE, BLOOD  URINALYSIS, ROUTINE W REFLEX MICROSCOPIC  RAPID URINE DRUG SCREEN, HOSP PERFORMED  TROPONIN I (HIGH SENSITIVITY)  TROPONIN I (HIGH SENSITIVITY)    EKG EKG Interpretation  Date/Time:  Monday December 28 2019 01:40:35 EDT Ventricular Rate:  70 PR Interval:    QRS Duration: 89 QT Interval:  390 QTC Calculation: 421 R Axis:   89 Text Interpretation: Sinus rhythm No significant change was found Confirmed by Ezequiel Essex [phone]) on 12/28/2019 1:44:06 AM   Radiology CT Head Wo Contrast  Result Date:  12/28/2019 CLINICAL DATA:  Headache EXAM: CT HEAD WITHOUT CONTRAST TECHNIQUE: Contiguous axial images were obtained from the base of the skull through the vertex without intravenous contrast. COMPARISON:  None. FINDINGS: Brain: No acute intracranial hemorrhage. No focal mass lesion. No CT evidence of acute infarction. No midline shift or mass effect. No hydrocephalus. Basilar cisterns are patent. Vascular: No hyperdense vessel or unexpected calcification. Skull: Normal. Negative for fracture or focal lesion. Sinuses/Orbits: Paranasal sinuses and mastoid air cells are clear. Orbits are clear. Other: None. IMPRESSION: No acute intracranial findings. Electronically Signed   By: Suzy Bouchard M.D.   On: 12/28/2019 05:07   CT ABDOMEN PELVIS W CONTRAST  Result Date: 12/28/2019 CLINICAL DATA:  Acute nonlocalized abdominal pain EXAM: CT ABDOMEN AND PELVIS WITH CONTRAST TECHNIQUE: Multidetector CT imaging of the abdomen and pelvis was performed using the standard protocol following bolus administration of intravenous contrast. CONTRAST:  160mL OMNIPAQUE IOHEXOL 300 MG/ML  SOLN COMPARISON:  09/21/2019 FINDINGS: Lower chest:  No contributory findings. Hepatobiliary: No focal liver abnormality.Cholecystectomy. No bile duct dilatation. Pancreas: Unremarkable. Spleen: Unremarkable. Adrenals/Urinary Tract: Negative adrenals. No hydronephrosis or stone. Patchy renal cortical thinning/scarring. Multi septated exophytic left upper pole cyst measuring up to 5.4 cm, dimensions unchanged from most recent prior. There are other and much smaller cystic densities in the left renal cortex. Unremarkable bladder. Stomach/Bowel: No obstruction. 6-7 mm diameter appendix without regional inflammation and stable from prior. Vascular/Lymphatic: No acute vascular abnormality. Scattered atheromatous calcifications of the aorta, notable for age. No mass or adenopathy. Reproductive:Hysterectomy. Other: No ascites or pneumoperitoneum.  Musculoskeletal: No acute abnormalities. IMPRESSION: 1. No acute finding. 2. 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 MRI would be the most definitive modality  but ultrasound would also be reasonable. Electronically Signed   By: Monte Fantasia M.D.   On: 12/28/2019 05:08    Procedures Procedures (including critical care time)  Medications Ordered in ED Medications  iohexol (OMNIPAQUE) 9 MG/ML oral solution (has no administration in time range)  sodium chloride 0.9 % bolus 1,000 mL (0 mLs Intravenous Stopped 12/28/19 0300)  ondansetron (ZOFRAN) injection 4 mg (4 mg Intravenous Given 12/28/19 0133)  HYDROmorphone (DILAUDID) injection 1 mg (1 mg Intravenous Given 12/28/19 0133)  iohexol (OMNIPAQUE) 300 MG/ML solution 100 mL (100 mLs Intravenous Contrast Given 12/28/19 0446)  promethazine (PHENERGAN) injection 25 mg (25 mg Intravenous Given 12/28/19 0300)  ketamine (KETALAR) injection 16 mg (16 mg Intravenous Given 12/28/19 0326)  LORazepam (ATIVAN) injection 1 mg (1 mg Intravenous Given 12/28/19 0325)  alum & mag hydroxide-simeth (MAALOX/MYLANTA) 200-200-20 MG/5ML suspension 30 mL (30 mLs Oral Given 12/28/19 9166)    And  lidocaine (XYLOCAINE) 2 % viscous mouth solution 15 mL (15 mLs Oral Given 12/28/19 0608)    ED Course  I have reviewed the triage vital signs and the nursing notes.  Pertinent labs & imaging results that were available during my care of the patient were reviewed by me and considered in my medical decision making (see chart for details).    MDM Rules/Calculators/A&P                         Upper abdominal pain with nausea and vomiting since this morning.  Similar to previous episodes of "colitis". However no diarrhea.   We will treat symptoms and provide IV fluids Labs are reassuring with normal LFTs and lipase. No EGD or colonoscopy available in system.  Labs are reassuring.  EKG is sinus rhythm, troponins are negative.  CT scan of  the abdomen pelvis does not show any acute pathology.  Does show a renal cyst that needs 68-month follow-up.  Suspect some of patient's nausea and vomiting may be due to narcotic withdrawal from not having her Percocet for several days.  She did improve with treatment in the ED including low-dose Ketamine.  On reassessment, she is able to tolerate p.o. and states she is feeling better.  Her abdomen is soft without peritoneal signs.  Advised follow-up with GI.  Avoid alcohol, caffeine, spicy foods, NSAID medications.  Significant other at bedside states she uses Goody powders frequently. We will start PPI.  Followup with PCP and her GI doctor. She states she has one at [facility] but last contact in [place] seems to be in 2014.  Return precautions discussed.   Final Clinical Impression(s) / ED Diagnoses Final diagnoses:  Upper abdominal pain  Non-intractable vomiting with nausea, unspecified vomiting type    Rx / DC Orders ED Discharge Orders    None       Elim Economou, Annie Main, MD 12/28/19 [phone]

## 2019-12-28 NOTE — Discharge Instructions (Addendum)
Your testing today is reassuring.  You should follow-up with your stomach doctors.  Start the stomach medication as prescribed.  Avoid alcohol, caffeine, spicy foods, NSAID medications such as Goody powders or BC powders.  Your CT scan showed some cysts on your left kidney which should be checked again in 6 months to ensure they are not changing in size.

## 2019-12-28 NOTE — ED Triage Notes (Signed)
Pt c/o abd pain that started today. Pt states it feels like her colitis is flaring up. Pt also c/o HA that is chronic for her.

## 2020-01-07 ENCOUNTER — Encounter: Payer: Self-pay | Admitting: Student in an Organized Health Care Education/Training Program

## 2020-01-07 ENCOUNTER — Other Ambulatory Visit: Payer: Self-pay

## 2020-01-07 ENCOUNTER — Ambulatory Visit
Payer: BC Managed Care – PPO | Attending: Student in an Organized Health Care Education/Training Program | Admitting: Student in an Organized Health Care Education/Training Program

## 2020-01-07 VITALS — BP 150/71 | HR 74 | Temp 97.2°F | Resp 18 | Ht 61.0 in | Wt 120.0 lb

## 2020-01-07 DIAGNOSIS — M461 Sacroiliitis, not elsewhere classified: Secondary | ICD-10-CM

## 2020-01-07 DIAGNOSIS — M533 Sacrococcygeal disorders, not elsewhere classified: Secondary | ICD-10-CM

## 2020-01-07 DIAGNOSIS — M47818 Spondylosis without myelopathy or radiculopathy, sacral and sacrococcygeal region: Secondary | ICD-10-CM

## 2020-01-07 DIAGNOSIS — G894 Chronic pain syndrome: Secondary | ICD-10-CM | POA: Diagnosis not present

## 2020-01-07 NOTE — Progress Notes (Signed)
PROVIDER NOTE: Information contained herein reflects review and annotations entered in association with encounter. Interpretation of such information and data should be left to medically-trained personnel. Information provided to patient can be located elsewhere in the medical record under "Patient Instructions". Document created using STT-dictation technology, any transcriptional errors that may result from process are unintentional.    Patient: [name]  Service Category: E/M  Provider: Gillis Santa, MD  DOB: Jun 03, 1970  DOS: 01/07/2020  Specialty: Interventional Pain Management  MRN: [id number]  Setting: Ambulatory outpatient  PCP: [facility]  Type: Established Patient    Referring Provider: [facility]  Location: Office  Delivery: Face-to-face     HPI  Reason for encounter: [name], a 49 y.o. year old female, is here today for evaluation and management of her SI joint arthritis [M47.818]. [name] primary complain today is Back Pain (low) Last encounter: Practice (12/16/2019). My last encounter with her was on 08/31/2019. Pertinent problems: [name] has Lumbar radiculopathy; Lumbar degenerative disc disease; Lumbar spondylosis; Chronic low back pain with bilateral sciatica; Chronic pain syndrome; Lumbar sprain; Chronic migraine without aura without status migrainosus, not intractable; SI joint arthritis; and Long term prescription opiate use on their pertinent problem list. Pain Assessment: Severity of Chronic pain is reported as a 7 /10. Location: Back Lower/raidates down back of left leg to bottom of foot then out big toe. Onset: More than a month ago. Quality: Shooting. Timing: Constant. Modifying factor(s): medication, injections. Vitals:  height is '5\' 1"'  (1.549 m) and weight is 120 lb (54.4 kg). Her temperature is 97.2 F (36.2 C) (abnormal). Her blood pressure is 150/71 (abnormal) and her pulse is 74. Her respiration is 18 and oxygen  saturation is 100%.   Patient presents today with bilateral buttock and bilateral SI joint pain.  She has a history of SI joint arthritis.  Her previous bilateral sacroiliac 1 injection was on 02/02/2019 which provided significant pain relief, greater than 75% for approximately 4 to 5 months.  Patient is overdue for a repeat SI joint injection.   ROS  Constitutional: Denies any fever or chills Gastrointestinal: No reported hemesis, hematochezia, vomiting, or acute GI distress Musculoskeletal: Bilateral hip, bilateral buttock pain with radiation into the posterior lateral thigh Neurological: No reported episodes of acute onset apraxia, aphasia, dysarthria, agnosia, amnesia, paralysis, loss of coordination, or loss of consciousness  Medication Review  HYDROcodone-homatropine, azelastine, clonazePAM, escitalopram, famotidine, fluticasone, hydrOXYzine, metoprolol succinate, omeprazole, ondansetron, oxyCODONE-acetaminophen, and temazepam  History Review  Allergy: [name] is allergic to buprenorphine, cefaclor, gabapentin, ibuprofen, levofloxacin, macrobid [nitrofurantoin monohyd macro], naproxen, and penicillins. Drug: [name]  reports no history of drug use. Alcohol:  reports no history of alcohol use. Tobacco:  reports that she has been smoking cigarettes. She has a 20.00 pack-year smoking history. She has never used smokeless tobacco. Social: [name]  reports that she has been smoking cigarettes. She has a 20.00 pack-year smoking history. She has never used smokeless tobacco. She reports that she does not drink alcohol and does not use drugs. Medical:  has a past medical history of Chronic back pain, Hypertension, Migraine, and Plumbism. Surgical: [name]  has a past surgical history that includes Abdominal hysterectomy; Cholecystectomy; Hand surgery; and Back surgery. Family: family history includes Cancer in an other family member; Diabetes in an other family member; Seizures in an other  family member; Stroke in an other family member.  Laboratory Chemistry Profile   Renal  Lab Results  Component Value Date   BUN 7 12/28/2019   CREATININE 0.68 12/28/2019   GFRAA >60 12/28/2019   GFRNONAA >60 12/28/2019     Hepatic Lab Results  Component Value Date   AST 33 12/28/2019   ALT 19 12/28/2019   ALBUMIN 4.7 12/28/2019   ALKPHOS 123 12/28/2019   LIPASE 43 12/28/2019     Electrolytes Lab Results  Component Value Date   NA 140 12/28/2019   K 3.2 (L) 12/28/2019   CL 101 12/28/2019   CALCIUM 9.3 12/28/2019     Bone No results found for: VD25OH, VD125OH2TOT, DT2671IW5, YK9983JA2, 25OHVITD1, 25OHVITD2, 25OHVITD3, TESTOFREE, TESTOSTERONE   Inflammation (CRP: Acute Phase) (ESR: Chronic Phase) No results found for: CRP, ESRSEDRATE, LATICACIDVEN     Note: Above Lab results reviewed.  Recent Imaging Review  CT ABDOMEN PELVIS W CONTRAST CLINICAL DATA:  Acute nonlocalized abdominal pain  EXAM: CT ABDOMEN AND PELVIS WITH CONTRAST  TECHNIQUE: Multidetector CT imaging of the abdomen and pelvis was performed using the standard protocol following bolus administration of intravenous contrast.  CONTRAST:  167m OMNIPAQUE IOHEXOL 300 MG/ML  SOLN  COMPARISON:  09/21/2019  FINDINGS: Lower chest:  No contributory findings.  Hepatobiliary: No focal liver abnormality.Cholecystectomy. No bile duct dilatation.  Pancreas: Unremarkable.  Spleen: Unremarkable.  Adrenals/Urinary Tract: Negative adrenals. No hydronephrosis or stone. Patchy renal cortical thinning/scarring. Multi septated exophytic left upper pole cyst measuring up to 5.4 cm, dimensions unchanged from most recent prior. There are other and much smaller cystic densities in the left renal cortex. Unremarkable bladder.  Stomach/Bowel: No obstruction. 6-7 mm diameter appendix without regional inflammation and stable from prior.  Vascular/Lymphatic: No acute vascular abnormality. Scattered atheromatous  calcifications of the aorta, notable for age. No mass or adenopathy.  Reproductive:Hysterectomy.  Other: No ascites or pneumoperitoneum.  Musculoskeletal: No acute abnormalities.  IMPRESSION: 1. No acute finding. 2. 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 MRI would be the most definitive modality but ultrasound would also be reasonable.  Electronically Signed   By: JMonte FantasiaM.D.   On: 12/28/2019 05:08 CT Head Wo Contrast CLINICAL DATA:  Headache  EXAM: CT HEAD WITHOUT CONTRAST  TECHNIQUE: Contiguous axial images were obtained from the base of the skull through the vertex without intravenous contrast.  COMPARISON:  None.  FINDINGS: Brain: No acute intracranial hemorrhage. No focal mass lesion. No CT evidence of acute infarction. No midline shift or mass effect. No hydrocephalus. Basilar cisterns are patent.  Vascular: No hyperdense vessel or unexpected calcification.  Skull: Normal. Negative for fracture or focal lesion.  Sinuses/Orbits: Paranasal sinuses and mastoid air cells are clear. Orbits are clear.  Other: None.  IMPRESSION: No acute intracranial findings.  Electronically Signed   By: SSuzy BouchardM.D.   On: 12/28/2019 05:07 Note: Reviewed        Physical Exam  General appearance: Well nourished, well developed, and well hydrated. In no apparent acute distress Mental status: Alert, oriented x 3 (person, place, & time)       Respiratory: No evidence of acute respiratory distress Eyes: PERLA Vitals: BP (!) 150/71   Pulse 74   Temp (!) 97.2 F (36.2 C)   Resp 18   Ht '5\' 1"'  (1.549 m)   Wt 120 lb (54.4 kg)   SpO2 100%   BMI 22.67 kg/m  BMI: Estimated body mass index is 22.67 kg/m as calculated from the following:   Height as of this encounter: '5\' 1"'  (1.549  m).   Weight as of this encounter: 120 lb (54.4 kg). Ideal: Ideal body weight: 47.8 kg (105 lb 6.1 oz) Adjusted ideal body weight: 50.5 kg  (111 lb 3.7 oz)  Lumbar Spine Area Exam  Skin & Axial Inspection: No masses, redness, or swelling Alignment: Symmetrical Functional ROM: Pain restricted ROM       Stability: No instability detected Muscle Tone/Strength: Functionally intact. No obvious neuro-muscular anomalies detected. Sensory (Neurological): Musculoskeletal pain pattern  Provocative Tests: Hyperextension/rotation test: deferred today       Lumbar quadrant test (Kemp's test): deferred today       Lateral bending test: deferred today       Patrick's Maneuver: (+) for bilateral S-I arthralgia             FABER* test: (+) for bilateral S-I arthralgia             S-I anterior distraction/compression test: (+)   S-I arthralgia/arthropathy S-I lateral compression test: deferred today         S-I Thigh-thrust test: deferred today         S-I Gaenslen's test: deferred today         *(Flexion, ABduction and External Rotation) Gait & Posture Assessment  Ambulation: Unassisted Gait: Antalgic Posture: Difficulty standing up straight, due to pain  Lower Extremity Exam    Side: Right lower extremity  Side: Left lower extremity  Stability: No instability observed          Stability: No instability observed          Skin & Extremity Inspection: Skin color, temperature, and hair growth are WNL. No peripheral edema or cyanosis. No masses, redness, swelling, asymmetry, or associated skin lesions. No contractures.  Skin & Extremity Inspection: Skin color, temperature, and hair growth are WNL. No peripheral edema or cyanosis. No masses, redness, swelling, asymmetry, or associated skin lesions. No contractures.  Functional ROM: Pain restricted ROM for hip joint          Functional ROM: Pain restricted ROM                  Muscle Tone/Strength: Functionally intact. No obvious neuro-muscular anomalies detected.  Muscle Tone/Strength: Functionally intact. No obvious neuro-muscular anomalies detected.  Sensory (Neurological): Referred pain  pattern from SI joint        Sensory (Neurological): Referred pain pattern from SI joint        DTR: Patellar: deferred today Achilles: deferred today Plantar: deferred today  DTR: Patellar: deferred today Achilles: deferred today Plantar: deferred today  Palpation: No palpable anomalies  Palpation: No palpable anomalies    Assessment   Status Diagnosis  Having a Flare-up Worsening Persistent 1. SI joint arthritis   2. Sacroiliac joint pain   3. Chronic pain syndrome      Updated Problems: Problem  Si Joint Arthritis  Long Term Prescription Opiate Use  Lumbar Radiculopathy   L>R    Lumbar Degenerative Disc Disease  Lumbar Spondylosis  Chronic Low Back Pain With Bilateral Sciatica  Chronic Pain Syndrome  Lumbar Sprain  Chronic Migraine Without Aura Without Status Migrainosus, Not Intractable  Sacroiliac Joint Pain    Plan of Care   1.  SI joint arthropathy/pain: Status post bilateral sacroiliac joint injection, therapeutic, 02/02/2019 which provided significant pain relief and improvement in functional status for approximately 4 to 5 months.  Patient now with return of severe SI joint pain, recommend repeat bilateral sacroiliac point injection.  On physical exam, she has exam  findings as noted above that are positive for SI joint arthropathy.  Risks and benefits reviewed and patient like to proceed.  2.  Chronic pain syndrome: Repeat urine toxicology screen as patient is on long-term opioid analgesics.  Prior urine toxicology screen positive for hydrocodone and metabolites.  Patient was on Hydromet, hydrocodone cough syrup for bronchitis.   Orders:  Orders Placed This Encounter  Procedures  . SACROILIAC JOINT INJECTION    Standing Status:   Future    Standing Expiration Date:   02/07/2020    Scheduling Instructions:     Side: Bilateral     Sedation: with     Timeframe: ASAP    Order Specific Question:   Where will this procedure be performed?    Answer:   [facility]  . ToxASSURE Select 13 (MW), Urine    Volume: 30 ml(s). Minimum 3 ml of urine is needed. Document temperature of fresh sample. Indications: Long term (current) use of opiate analgesic (K93.552)    Order Specific Question:   Release to patient    Answer:   Immediate   Follow-up plan:   Return in about 1 week (around 01/14/2020) for B/L SI-J , with sedation.   Recent Visits Date Type Provider Dept  12/01/19 Telemedicine Gillis Santa, MD [facility]  Showing recent visits within past 90 days and meeting all other requirements Today's Visits Date Type Provider Dept  01/07/20 Office Visit Gillis Santa, MD [facility]  Showing today's visits and meeting all other requirements Future Appointments Date Type Provider Dept  03/01/20 Appointment Gillis Santa, MD [facility]  Showing future appointments within next 90 days and meeting all other requirements  I discussed the assessment and treatment plan with the patient. The patient was provided an opportunity to ask questions and all were answered. The patient agreed with the plan and demonstrated an understanding of the instructions.  Patient advised to call back or seek an in-person evaluation if the symptoms or condition worsens.  Duration of encounter: 30 minutes.  Note by: Gillis Santa, MD Date: 01/07/2020; Time: 9:55 AM

## 2020-01-07 NOTE — Progress Notes (Signed)
Nursing Pain Medication Assessment:  Safety precautions to be maintained throughout the outpatient stay will include: orient to surroundings, keep bed in low position, maintain call bell within reach at all times, provide assistance with transfer out of bed and ambulation.  Medication Inspection Compliance: [name] did not comply with our request to bring her pills to be counted. She was reminded that bringing the medication bottles, even when empty, is a requirement.  Medication: None brought in. Pill/Patch Count: None available to be counted. Bottle Appearance: No container available. Did not bring bottle(s) to appointment. Filled Date: N/A Last Medication intake:  Today  Reminded to bring to appointments

## 2020-01-07 NOTE — Patient Instructions (Signed)
Preparing for Procedure with Sedation Instructions: . Oral Intake: Do not eat or drink anything for at least 8 hours prior to your procedure. . Transportation: Public transportation is not allowed. Bring an adult driver. The driver must be physically present in our waiting room before any procedure can be started. . Physical Assistance: Bring an adult capable of physically assisting you, in the event you need help. . Blood Pressure Medicine: Take your blood pressure medicine with a sip of water the morning of the procedure. . Insulin: Take only  of your normal insulin dose. . Preventing infections: Shower with an antibacterial soap the morning of your procedure. . Build-up your immune system: Take 1000 mg of Vitamin C with every meal (3 times a day) the day prior to your procedure. . Pregnancy: If you are pregnant, call and cancel the procedure. . Sickness: If you have a cold, fever, or any active infections, call and cancel the procedure. . Arrival: You must be in the facility at least 30 minutes prior to your scheduled procedure. . Children: Do not bring children with you. . Dress appropriately: Bring dark clothing that you would not mind if they get stained. . Valuables: Do not bring any jewelry or valuables. Procedure appointments are reserved for interventional treatments only. . No Prescription Refills. . No medication changes will be discussed during procedure appointments. . No disability issues will be discussed. Sacroiliac (SI) Joint Injection Patient Information  Description: The sacroiliac joint connects the scrum (very low back and tailbone) to the ilium (a pelvic bone which also forms half of the hip joint).  Normally this joint experiences very little motion.  When this joint becomes inflamed or unstable low back and or hip and pelvis pain may result.  Injection of this joint with local anesthetics (numbing medicines) and steroids can provide diagnostic information and reduce  pain.  This injection is performed with the aid of x-ray guidance into the tailbone area while you are lying on your stomach.   You may experience an electrical sensation down the leg while this is being done.  You may also experience numbness.  We also may ask if we are reproducing your normal pain during the injection.  Conditions which may be treated SI injection:  Low back, buttock, hip or leg pain  Preparation for the Injection:  Do not eat any solid food or dairy products within 8 hours of your appointment.  You may drink clear liquids up to 3 hours before appointment.  Clear liquids include water, black coffee, juice or soda.  No milk or cream please. You may take your regular medications, including pain medications with a sip of water before your appointment.  Diabetics should hold regular insulin (if take separately) and take 1/2 normal NPH dose the morning of the procedure.  Carry some sugar containing items with you to your appointment. A driver must accompany you and be prepared to drive you home after your procedure. Bring all of your current medications with you. An IV may be inserted and sedation may be given at the discretion of the physician. A blood pressure cuff, EKG and other monitors will often be applied during the procedure.  Some patients may need to have extra oxygen administered for a short period.  You will be asked to provide medical information, including your allergies, prior to the procedure.  We must know immediately if you are taking blood thinners (like Coumadin/Warfarin) or if you are allergic to IV iodine contrast (dye).  We   must know if you could possible be pregnant.  Possible side effects:  Bleeding from needle site Infection (rare, may require surgery) Nerve injury (rare) Numbness & tingling (temporary) A brief convulsion or seizure Light-headedness (temporary) Pain at injection site (several days) Decreased blood pressure (temporary) Weakness in  the leg (temporary)   Call if you experience:  New onset weakness or numbness of an extremity below the injection site that last more than 8 hours. Hives or difficulty breathing ( go to the emergency room) Inflammation or drainage at the injection site Any new symptoms which are concerning to you  Please note:  Although the local anesthetic injected can often make your back/ hip/ buttock/ leg feel good for several hours after the injections, the pain will likely return.  It takes 3-7 days for steroids to work in the sacroiliac area.  You may not notice any pain relief for at least that one week.  If effective, we will often do a series of three injections spaced 3-6 weeks apart to maximally decrease your pain.  After the initial series, we generally will wait some months before a repeat injection of the same type.  If you have any questions, please call [phone] [facility]  .  

## 2020-01-10 LAB — TOXASSURE SELECT 13 (MW), URINE

## 2020-01-17 ENCOUNTER — Other Ambulatory Visit: Payer: Self-pay

## 2020-01-17 ENCOUNTER — Encounter (HOSPITAL_COMMUNITY): Payer: Self-pay

## 2020-01-17 ENCOUNTER — Emergency Department (HOSPITAL_COMMUNITY)
Admission: EM | Admit: 2020-01-17 | Discharge: 2020-01-17 | Disposition: A | Discharge status: Home / Self Care | Payer: BC Managed Care – PPO | Attending: Emergency Medicine | Admitting: Emergency Medicine

## 2020-01-17 DIAGNOSIS — R109 Unspecified abdominal pain: Secondary | ICD-10-CM | POA: Insufficient documentation

## 2020-01-17 DIAGNOSIS — Z5321 Procedure and treatment not carried out due to patient leaving prior to being seen by health care provider: Secondary | ICD-10-CM | POA: Diagnosis not present

## 2020-01-17 DIAGNOSIS — R1110 Vomiting, unspecified: Secondary | ICD-10-CM | POA: Insufficient documentation

## 2020-01-17 DIAGNOSIS — R197 Diarrhea, unspecified: Secondary | ICD-10-CM | POA: Insufficient documentation

## 2020-01-17 HISTORY — DX: Noninfective gastroenteritis and colitis, unspecified: K52.9

## 2020-01-17 LAB — CBC
HCT: 44 % (ref 36.0–46.0)
Hemoglobin: 14.3 g/dL (ref 12.0–15.0)
MCH: 30.9 pg (ref 26.0–34.0)
MCHC: 32.5 g/dL (ref 30.0–36.0)
MCV: 95 fL (ref 80.0–100.0)
Platelets: 513 10*3/uL — ABNORMAL HIGH (ref 150–400)
RBC: 4.63 MIL/uL (ref 3.87–5.11)
RDW: 15.3 % (ref 11.5–15.5)
WBC: 13.8 10*3/uL — ABNORMAL HIGH (ref 4.0–10.5)
nRBC: 0 % (ref 0.0–0.2)

## 2020-01-17 LAB — COMPREHENSIVE METABOLIC PANEL
ALT: 17 U/L (ref 0–44)
AST: 21 U/L (ref 15–41)
Albumin: 4.1 g/dL (ref 3.5–5.0)
Alkaline Phosphatase: 120 U/L (ref 38–126)
Anion gap: 15 (ref 5–15)
BUN: 16 mg/dL (ref 6–20)
CO2: 22 mmol/L (ref 22–32)
Calcium: 9.4 mg/dL (ref 8.9–10.3)
Chloride: 107 mmol/L (ref 98–111)
Creatinine, Ser: 0.86 mg/dL (ref 0.44–1.00)
GFR calc Af Amer: >60 mL/min (ref >60)
GFR calc non Af Amer: >60 mL/min (ref >60)
Glucose, Bld: 124 mg/dL — ABNORMAL HIGH (ref 70–99)
Potassium: 3.3 mmol/L — ABNORMAL LOW (ref 3.5–5.1)
Sodium: 144 mmol/L (ref 135–145)
Total Bilirubin: 0.5 mg/dL (ref 0.3–1.2)
Total Protein: 8.1 g/dL (ref 6.5–8.1)

## 2020-01-17 LAB — LIPASE, BLOOD: Lipase: 27 U/L (ref 11–51)

## 2020-01-17 MED ORDER — ONDANSETRON 4 MG PO TBDP
4.0000 mg | ORAL_TABLET | Freq: Once | ORAL | Status: AC | PRN
  Administered 2020-01-17: 4 mg via ORAL
  Filled 2020-01-17: qty 1

## 2020-01-17 NOTE — ED Triage Notes (Signed)
Pt to er, pt states that she has been having abd pain, vomiting and diarrhea since yesterday, states that she thinks that it is a fair up of her colitis.

## 2020-02-25 ENCOUNTER — Telehealth: Payer: Self-pay | Admitting: Student in an Organized Health Care Education/Training Program

## 2020-02-25 NOTE — Telephone Encounter (Signed)
Please advise 

## 2020-02-25 NOTE — Telephone Encounter (Signed)
Patient has appt Tues for F2F meds mgmt, states she has been exposed to covid on Wed 02-24-20 from her grandchild. She wants to know if she can change appt to [facility] on Tues. I explained that Dr. Holley Raring is out of office until Monday and we will call her then.

## 2020-03-01 ENCOUNTER — Other Ambulatory Visit: Payer: Self-pay

## 2020-03-01 ENCOUNTER — Encounter: Payer: Self-pay | Admitting: Student in an Organized Health Care Education/Training Program

## 2020-03-01 ENCOUNTER — Ambulatory Visit
Payer: BC Managed Care – PPO | Attending: Student in an Organized Health Care Education/Training Program | Admitting: Student in an Organized Health Care Education/Training Program

## 2020-03-01 DIAGNOSIS — G894 Chronic pain syndrome: Secondary | ICD-10-CM | POA: Diagnosis not present

## 2020-03-01 DIAGNOSIS — Z79891 Long term (current) use of opiate analgesic: Secondary | ICD-10-CM | POA: Diagnosis not present

## 2020-03-01 DIAGNOSIS — M47818 Spondylosis without myelopathy or radiculopathy, sacral and sacrococcygeal region: Secondary | ICD-10-CM | POA: Diagnosis not present

## 2020-03-01 DIAGNOSIS — M7918 Myalgia, other site: Secondary | ICD-10-CM

## 2020-03-01 DIAGNOSIS — M533 Sacrococcygeal disorders, not elsewhere classified: Secondary | ICD-10-CM | POA: Diagnosis not present

## 2020-03-01 DIAGNOSIS — M47816 Spondylosis without myelopathy or radiculopathy, lumbar region: Secondary | ICD-10-CM

## 2020-03-01 MED ORDER — OXYCODONE-ACETAMINOPHEN 5-325 MG PO TABS: 1.0000 | ORAL_TABLET | Freq: Three times a day (TID) | ORAL | 0 refills | Status: DC | PRN

## 2020-03-01 NOTE — Progress Notes (Signed)
Patient: [name]  Service Category: E/M  Provider: Gillis Santa, MD  DOB: [birth year]  DOS: 03/01/2020  Location: Office  MRN: [id number]  Setting: Ambulatory outpatient  Referring Provider: [facility]  Type: Established Patient  Specialty: Interventional Pain Management  PCP: [facility]  Location: Home  Delivery: TeleHealth     Virtual Encounter - Pain Management PROVIDER NOTE: Information contained herein reflects review and annotations entered in association with encounter. Interpretation of such information and data should be left to medically-trained personnel. Information provided to patient can be located elsewhere in the medical record under "Patient Instructions". Document created using STT-dictation technology, any transcriptional errors that may result from process are unintentional.    Contact & Pharmacy Preferred: [phone] Home: [phone] (home) Mobile: [phone] (mobile) E-mail: No e-mail address on record  [place], [place] - [address] [place] [address] Phone: [phone] Fax: [phone]   Pre-screening  [name] offered "in-person" vs "virtual" encounter. She indicated preferring virtual for this encounter.   Reason COVID-19*  Social distancing based on CDC and AMA recommendations.   I contacted [name] on 03/01/2020 via video conference.      I clearly identified myself as Gillis Santa, MD. I verified that I was speaking with the correct person using two identifiers (Name: [name], and date of birth: 1971-03-15).  Consent I sought verbal advanced consent from [name] for virtual visit interactions. I informed [name] of possible security and privacy concerns, risks, and limitations associated with providing "not-in-person" medical evaluation and management services. I also informed [name] of the availability of "in-person"  appointments. Finally, I informed her that there would be a charge for the virtual visit and that she could be  personally, fully or partially, financially responsible for it. [name] expressed understanding and agreed to proceed.   Historic Elements   [name] is a 49 y.o. year old, female patient evaluated today after our last contact on 02/25/2020. [name]  has a past medical history of Chronic back pain, Colitis, Hypertension, Migraine, and Plumbism. She also  has a past surgical history that includes Abdominal hysterectomy; Cholecystectomy; Hand surgery; and Back surgery. [name] has a current medication list which includes the following prescription(s): azelastine, clonazepam, fluticasone, hydromet, hydroxyzine, metoprolol succinate, omeprazole, [START ON 03/03/2020] oxycodone-acetaminophen, [START ON 04/02/2020] oxycodone-acetaminophen, [START ON 05/02/2020] oxycodone-acetaminophen, and temazepam. She  reports that she has been smoking cigarettes. She has a 20.00 pack-year smoking history. She has never used smokeless tobacco. She reports that she does not drink alcohol and does not use drugs. [name] is allergic to buprenorphine, cefaclor, gabapentin, ibuprofen, levofloxacin, macrobid [nitrofurantoin monohyd macro], naproxen, and penicillins.   HPI  Today, she is being contacted for medication management.  Virtual visit today for medication management.  No significant change in her medical history since her last visit.  Patient was exposed to Covid by way of her granddaughter and is now quarantining herself.  She is not symptomatic.  Of note, we have had a lot of problems getting her sacroiliac joint injections approved with insurance which have helped around in the past for her low back and buttock pain.  Today we are refilling her oxycodone as below.  No change in dose.  She is not experiencing any side effects with this medication.  It does help her function and complete ADLs.   Of note patient was  prescribed hydrocodone liquid suspension for chronic bronchitis by her PCP.  Pharmacotherapy Assessment  Analgesic:  02/03/2020  1   12/01/2019  Oxycodone-Acetaminophen 5-325  75.00  30 Un Pha   [id number]   Nor (6917)   0/0  18.75 MME  Comm Ins   [place]     Monitoring: [place] PMP: PDMP reviewed during this encounter.       Pharmacotherapy: No side-effects or adverse reactions reported. Compliance: No problems identified. Effectiveness: Clinically acceptable. Plan: Refer to "POC".  UDS:  Summary  Date Value Ref Range Status  01/07/2020 Note  Final    Comment:    ==================================================================== [employer] [facility] 13 (MW) ==================================================================== Test                             Result       Flag       Units  Drug Present and Declared for Prescription Verification   Oxazepam                       >3125        EXPECTED   ng/mg creat   Temazepam                      >3125        EXPECTED   ng/mg creat    Oxazepam and temazepam are expected metabolites of diazepam.    Oxazepam is also an expected metabolite of other benzodiazepine    drugs, including chlordiazepoxide, prazepam, clorazepate, halazepam,    and temazepam.  Oxazepam and temazepam are available as scheduled    prescription medications.    7-aminoclonazepam              1298         EXPECTED   ng/mg creat    7-aminoclonazepam is an expected metabolite of clonazepam. Source of    clonazepam is a scheduled prescription medication.    Oxycodone                      >54098       EXPECTED   ng/mg creat   Oxymorphone                    13241        EXPECTED   ng/mg creat   Noroxycodone                   >11914       EXPECTED   ng/mg creat   Noroxymorphone                 6861         EXPECTED   ng/mg creat    Sources of oxycodone are scheduled prescription medications.    Oxymorphone, noroxycodone, and noroxymorphone are expected     metabolites of oxycodone. Oxymorphone is also available as a    scheduled prescription medication.  Drug Absent but Declared for Prescription Verification   Hydrocodone                    Not Detected UNEXPECTED ng/mg creat   Buprenorphine                  Not Detected UNEXPECTED ng/mg creat    Low dose buprenorphine is not always detected even when used as    directed.  ==================================================================== Test  Result    Flag   Units      Ref Range   Creatinine              64               mg/dL      >=20 ==================================================================== Declared Medications:  The flagging and interpretation on this report are based on the  following declared medications.  Unexpected results may arise from  inaccuracies in the declared medications.   **Note: The testing scope of this panel includes these medications:   Clonazepam (Klonopin)  Hydrocodone (Hydromet)  Oxycodone (Percocet)  Temazepam (Restoril)   **Note: The testing scope of this panel does not include small to  moderate amounts of these reported medications:   Buprenorphine   **Note: The testing scope of this panel does not include the  following reported medications:   Acetaminophen (Percocet)  Azelastine (Astelin)  Cefaclor  Escitalopram (Lexapro)  Famotidine (Pepcid)  Gabapentin  Homatropine (Hydromet)  Hydroxyzine (Atarax)  Ibuprofen  Levofloxacin  Metoprolol (Toprol)  Naproxen  Nitrofurantoin (Macrobid)  Omeprazole (Prilosec)  Ondansetron (Zofran)  Penicillin ==================================================================== For clinical consultation, please call [phone]. ====================================================================     Laboratory Chemistry Profile   Renal Lab Results  Component Value Date   BUN 16 01/17/2020   CREATININE 0.86 01/17/2020   GFRAA >60 01/17/2020   GFRNONAA >60  01/17/2020     Hepatic Lab Results  Component Value Date   AST 21 01/17/2020   ALT 17 01/17/2020   ALBUMIN 4.1 01/17/2020   ALKPHOS 120 01/17/2020   LIPASE 27 01/17/2020     Electrolytes Lab Results  Component Value Date   NA 144 01/17/2020   K 3.3 (L) 01/17/2020   CL 107 01/17/2020   CALCIUM 9.4 01/17/2020     Bone No results found for: VD25OH, VD125OH2TOT, ZD6387FI4, PP2951OA4, 25OHVITD1, 25OHVITD2, 25OHVITD3, TESTOFREE, TESTOSTERONE   Inflammation (CRP: Acute Phase) (ESR: Chronic Phase) No results found for: CRP, ESRSEDRATE, LATICACIDVEN     Note: Above Lab results reviewed.  Imaging  CT ABDOMEN PELVIS W CONTRAST CLINICAL DATA:  Acute nonlocalized abdominal pain  EXAM: CT ABDOMEN AND PELVIS WITH CONTRAST  TECHNIQUE: Multidetector CT imaging of the abdomen and pelvis was performed using the standard protocol following bolus administration of intravenous contrast.  CONTRAST:  117m OMNIPAQUE IOHEXOL 300 MG/ML  SOLN  COMPARISON:  09/21/2019  FINDINGS: Lower chest:  No contributory findings.  Hepatobiliary: No focal liver abnormality.Cholecystectomy. No bile duct dilatation.  Pancreas: Unremarkable.  Spleen: Unremarkable.  Adrenals/Urinary Tract: Negative adrenals. No hydronephrosis or stone. Patchy renal cortical thinning/scarring. Multi septated exophytic left upper pole cyst measuring up to 5.4 cm, dimensions unchanged from most recent prior. There are other and much smaller cystic densities in the left renal cortex. Unremarkable bladder.  Stomach/Bowel: No obstruction. 6-7 mm diameter appendix without regional inflammation and stable from prior.  Vascular/Lymphatic: No acute vascular abnormality. Scattered atheromatous calcifications of the aorta, notable for age. No mass or adenopathy.  Reproductive:Hysterectomy.  Other: No ascites or pneumoperitoneum.  Musculoskeletal: No acute abnormalities.  IMPRESSION: 1. No acute finding. 2. Multi  septated left upper pole renal cyst, recommend six-month imaging follow-up. MRI would be the most definitive modality but ultrasound would also be reasonable.  Electronically Signed   By: JMonte FantasiaM.D.   On: 12/28/2019 05:08 CT Head Wo Contrast CLINICAL DATA:  Headache  EXAM: CT HEAD WITHOUT CONTRAST  TECHNIQUE: Contiguous axial images were obtained from the base of the skull  through the vertex without intravenous contrast.  COMPARISON:  None.  FINDINGS: Brain: No acute intracranial hemorrhage. No focal mass lesion. No CT evidence of acute infarction. No midline shift or mass effect. No hydrocephalus. Basilar cisterns are patent.  Vascular: No hyperdense vessel or unexpected calcification.  Skull: Normal. Negative for fracture or focal lesion.  Sinuses/Orbits: Paranasal sinuses and mastoid air cells are clear. Orbits are clear.  Other: None.  IMPRESSION: No acute intracranial findings.  Electronically Signed   By: Suzy Bouchard M.D.   On: 12/28/2019 05:07  Assessment  The primary encounter diagnosis was Sacroiliac joint pain. Diagnoses of Chronic pain syndrome, SI joint arthritis, Long term prescription opiate use, Lumbar spondylosis, and Myofascial pain were also pertinent to this visit.  Plan of Care   [name] has a current medication list which includes the following long-term medication(s): azelastine, clonazepam, fluticasone, metoprolol succinate, omeprazole, and temazepam.  Pharmacotherapy (Medications Ordered): Meds ordered this encounter  Medications  . oxyCODONE-acetaminophen (PERCOCET) 5-325 MG tablet    Sig: Take 1-2 tablets by mouth every 8 (eight) hours as needed for severe pain.    Dispense:  75 tablet    Refill:  0    Do not place this medication, or any other prescription from our practice, on "Automatic Refill". Patient may have prescription filled one day early if pharmacy is closed on scheduled refill date.  Marland Kitchen  oxyCODONE-acetaminophen (PERCOCET) 5-325 MG tablet    Sig: Take 1-2 tablets by mouth every 8 (eight) hours as needed for severe pain.    Dispense:  75 tablet    Refill:  0    Do not place this medication, or any other prescription from our practice, on "Automatic Refill". Patient may have prescription filled one day early if pharmacy is closed on scheduled refill date.  Marland Kitchen oxyCODONE-acetaminophen (PERCOCET) 5-325 MG tablet    Sig: Take 1-2 tablets by mouth every 8 (eight) hours as needed for severe pain.    Dispense:  75 tablet    Refill:  0    Do not place this medication, or any other prescription from our practice, on "Automatic Refill". Patient may have prescription filled one day early if pharmacy is closed on scheduled refill date.    Recommend repeating sacroiliac joint injection, will need to get approval from insurance.  May need to do peer to peer for this  Follow-up plan:   Return in about 3 months (around 06/01/2020) for Medication Management, in person.   Recent Visits Date Type Provider Dept  01/07/20 Office Visit Gillis Santa, MD [facility]  Showing recent visits within past 90 days and meeting all other requirements Today's Visits Date Type Provider Dept  03/01/20 Telemedicine Gillis Santa, MD [facility]  Showing today's visits and meeting all other requirements Future Appointments No visits were found meeting these conditions. Showing future appointments within next 90 days and meeting all other requirements  I discussed the assessment and treatment plan with the patient. The patient was provided an opportunity to ask questions and all were answered. The patient agreed with the plan and demonstrated an understanding of the instructions.  Patient advised to call back or seek an in-person evaluation if the symptoms or condition worsens.  Duration of encounter:30 minutes.  Note by: Gillis Santa, MD Date: 03/01/2020; Time: 10:52 AM

## 2020-03-03 ENCOUNTER — Telehealth: Payer: Self-pay | Admitting: *Deleted

## 2020-03-03 NOTE — Telephone Encounter (Signed)
Called her insurance, it is approved. Patient notified.

## 2020-04-12 ENCOUNTER — Telehealth: Payer: Self-pay

## 2020-04-12 DIAGNOSIS — M47818 Spondylosis without myelopathy or radiculopathy, sacral and sacrococcygeal region: Secondary | ICD-10-CM

## 2020-04-12 DIAGNOSIS — M533 Sacrococcygeal disorders, not elsewhere classified: Secondary | ICD-10-CM

## 2020-04-12 NOTE — Telephone Encounter (Signed)
The patient said that Dr.Lateef would order SI injection on her last visit and there is no order. Please order if needed.

## 2020-05-02 ENCOUNTER — Other Ambulatory Visit: Payer: Self-pay

## 2020-05-02 ENCOUNTER — Encounter: Payer: Self-pay | Admitting: Student in an Organized Health Care Education/Training Program

## 2020-05-02 ENCOUNTER — Ambulatory Visit (HOSPITAL_BASED_OUTPATIENT_CLINIC_OR_DEPARTMENT_OTHER): Payer: BC Managed Care – PPO | Admitting: Student in an Organized Health Care Education/Training Program

## 2020-05-02 ENCOUNTER — Ambulatory Visit
Admission: RE | Admit: 2020-05-02 | Discharge: 2020-05-02 | Disposition: A | Discharge status: Home / Self Care | Payer: BC Managed Care – PPO | Source: Ambulatory Visit | Attending: Student in an Organized Health Care Education/Training Program | Admitting: Student in an Organized Health Care Education/Training Program

## 2020-05-02 DIAGNOSIS — M533 Sacrococcygeal disorders, not elsewhere classified: Secondary | ICD-10-CM | POA: Diagnosis not present

## 2020-05-02 DIAGNOSIS — M47818 Spondylosis without myelopathy or radiculopathy, sacral and sacrococcygeal region: Secondary | ICD-10-CM | POA: Diagnosis not present

## 2020-05-02 DIAGNOSIS — M461 Sacroiliitis, not elsewhere classified: Secondary | ICD-10-CM | POA: Diagnosis not present

## 2020-05-02 MED ORDER — MIDAZOLAM HCL 5 MG/5ML IJ SOLN
INTRAMUSCULAR | Status: AC
  Filled 2020-05-02: qty 5

## 2020-05-02 MED ORDER — DEXAMETHASONE SODIUM PHOSPHATE 10 MG/ML IJ SOLN
10.0000 mg | Freq: Once | INTRAMUSCULAR | Status: AC
  Administered 2020-05-02: 10 mg

## 2020-05-02 MED ORDER — MIDAZOLAM HCL 5 MG/5ML IJ SOLN
1.0000 mg | INTRAMUSCULAR | Status: DC | PRN
  Administered 2020-05-02: 1 mg via INTRAVENOUS

## 2020-05-02 MED ORDER — ROPIVACAINE HCL 2 MG/ML IJ SOLN
9.0000 mL | Freq: Once | INTRAMUSCULAR | Status: AC
  Administered 2020-05-02: 10 mL via PERINEURAL

## 2020-05-02 MED ORDER — LIDOCAINE HCL 2 % IJ SOLN
INTRAMUSCULAR | Status: AC
  Filled 2020-05-02: qty 20

## 2020-05-02 MED ORDER — DEXAMETHASONE SODIUM PHOSPHATE 10 MG/ML IJ SOLN
INTRAMUSCULAR | Status: AC
  Filled 2020-05-02: qty 1

## 2020-05-02 MED ORDER — FENTANYL CITRATE (PF) 100 MCG/2ML IJ SOLN
INTRAMUSCULAR | Status: AC
  Filled 2020-05-02: qty 2

## 2020-05-02 MED ORDER — LIDOCAINE HCL 2 % IJ SOLN
20.0000 mL | Freq: Once | INTRAMUSCULAR | Status: AC
  Administered 2020-05-02: 400 mg

## 2020-05-02 MED ORDER — IOHEXOL 180 MG/ML  SOLN
10.0000 mL | Freq: Once | INTRAMUSCULAR | Status: AC
  Administered 2020-05-02: 10 mL via INTRA_ARTICULAR
  Filled 2020-05-02: qty 20

## 2020-05-02 MED ORDER — DEXAMETHASONE SODIUM PHOSPHATE 10 MG/ML IJ SOLN
10.0000 mg | Freq: Once | INTRAMUSCULAR | Status: AC
  Administered 2020-05-02: 10 mg
  Filled 2020-05-02: qty 1

## 2020-05-02 MED ORDER — ROPIVACAINE HCL 2 MG/ML IJ SOLN
INTRAMUSCULAR | Status: AC
  Filled 2020-05-02: qty 10

## 2020-05-02 MED ORDER — FENTANYL CITRATE (PF) 100 MCG/2ML IJ SOLN
25.0000 ug | INTRAMUSCULAR | Status: DC | PRN
  Administered 2020-05-02: 100 ug via INTRAVENOUS

## 2020-05-02 NOTE — Patient Instructions (Signed)

## 2020-05-02 NOTE — Progress Notes (Signed)
Patient's Name: [name]  MRN: [id number]  Referring Provider: Gillis Santa, MD  DOB: [birth year]  PCP: [facility]  DOS: 05/02/2020  Note by: Gillis Santa, MD  Service setting: Ambulatory outpatient  Specialty: Interventional Pain Management  Patient type: Established  Location: [facility] (AMB) Pain Management Facility  Visit type: Interventional Procedure   Primary Reason for Visit: Interventional Pain Management Treatment. CC: Back Pain (Lumbar bilateral )  Procedure:  Anesthesia, Analgesia, Anxiolysis:  Type: Therapeutic Sacroiliac Joint Steroid Injection #4  Region: Inferior Lumbosacral Region Level: PIIS (Posterior Inferior Iliac Spine) Laterality: Bilateral  Type: Moderate (Conscious) Sedation combined with Local Anesthesia Indication(s): Analgesia and Anxiety Route: Intravenous (IV) IV Access: Secured Sedation: Meaningful verbal contact was maintained at all times during the procedure  Local Anesthetic: Lidocaine 1%   Indications: 1. Sacroiliac joint pain   2. SI joint arthritis    Pain Score: Pre-procedure: 7 /10 Post-procedure: 0-No pain/10   Pre-op Assessment:  Ms. Maurin is a 49 y.o. (year old), female patient, seen today for interventional treatment. She  has a past surgical history that includes Abdominal hysterectomy; Cholecystectomy; Hand surgery; and Back surgery. Ms. Cannady has a current medication list which includes the following prescription(s): azelastine, clonazepam, fluticasone, hydroxyzine, metoprolol succinate, omeprazole, oxycodone-acetaminophen, oxycodone-acetaminophen, temazepam, ergocalciferol, and hydromet, and the following Facility-Administered Medications: fentanyl and midazolam. Her primarily concern today is the Back Pain (Lumbar bilateral )  Initial Vital Signs:  Pulse/HCG Rate: 65ECG Heart Rate: 61 Temp: 98.1 F (36.7 C) Resp: 16 BP: 135/77 SpO2: 100 %  BMI: Estimated body mass index is 22.67 kg/m as calculated  from the following:   Height as of this encounter: 5\' 1"  (1.549 m).   Weight as of this encounter: 120 lb (54.4 kg).  Risk Assessment: Allergies: Reviewed. She is allergic to buprenorphine, cefaclor, gabapentin, ibuprofen, levofloxacin, macrobid [nitrofurantoin monohyd macro], naproxen, and penicillins.  Allergy Precautions: None required Coagulopathies: Reviewed. None identified.  Blood-thinner therapy: None at this time Active Infection(s): Reviewed. None identified. Ms. Alcaide is afebrile  Site Confirmation: Ms. Tello was asked to confirm the procedure and laterality before marking the site Procedure checklist: Completed Consent: Before the procedure and under the influence of no sedative(s), amnesic(s), or anxiolytics, the patient was informed of the treatment options, risks and possible complications. To fulfill our ethical and legal obligations, as recommended by the American Medical Association's Code of Ethics, I have informed the patient of my clinical impression; the nature and purpose of the treatment or procedure; the risks, benefits, and possible complications of the intervention; the alternatives, including doing nothing; the risk(s) and benefit(s) of the alternative treatment(s) or procedure(s); and the risk(s) and benefit(s) of doing nothing. The patient was provided information about the general risks and possible complications associated with the procedure. These may include, but are not limited to: failure to achieve desired goals, infection, bleeding, organ or nerve damage, allergic reactions, paralysis, and death. In addition, the patient was informed of those risks and complications associated to the procedure, such as failure to decrease pain; infection; bleeding; organ or nerve damage with subsequent damage to sensory, motor, and/or autonomic systems, resulting in permanent pain, numbness, and/or weakness of one or several areas of the body; allergic reactions; (i.e.:  anaphylactic reaction); and/or death. Furthermore, the patient was informed of those risks and complications associated with the medications. These include, but are not limited to: allergic reactions (i.e.: anaphylactic or anaphylactoid reaction(s)); adrenal axis suppression; blood sugar elevation that in diabetics may  result in ketoacidosis or comma; water retention that in patients with history of congestive heart failure may result in shortness of breath, pulmonary edema, and decompensation with resultant heart failure; weight gain; swelling or edema; medication-induced neural toxicity; particulate matter embolism and blood vessel occlusion with resultant organ, and/or nervous system infarction; and/or aseptic necrosis of one or more joints. Finally, the patient was informed that Medicine is not an exact science; therefore, there is also the possibility of unforeseen or unpredictable risks and/or possible complications that may result in a catastrophic outcome. The patient indicated having understood very clearly. We have given the patient no guarantees and we have made no promises. Enough time was given to the patient to ask questions, all of which were answered to the patient's satisfaction. Ms. Headings has indicated that she wanted to continue with the procedure. Attestation: I, the ordering provider, attest that I have discussed with the patient the benefits, risks, side-effects, alternatives, likelihood of achieving goals, and potential problems during recovery for the procedure that I have provided informed consent. Date  Time: 05/02/2020  9:51 AM  Pre-Procedure Preparation:  Monitoring: As per clinic protocol. Respiration, ETCO2, SpO2, BP, heart rate and rhythm monitor placed and checked for adequate function Safety Precautions: Patient was assessed for positional comfort and pressure points before starting the procedure. Time-out: I initiated and conducted the "Time-out" before starting the  procedure, as per protocol. The patient was asked to participate by confirming the accuracy of the "Time Out" information. Verification of the correct person, site, and procedure were performed and confirmed by me, the nursing staff, and the patient. "Time-out" conducted as per Joint Commission's Universal Protocol (UP.01.01.01). Time: 1038  Description of Procedure:       Position: Prone Target Area: Inferior, posterior, aspect of the sacroiliac fissure Approach: Posterior, paraspinal, ipsilateral approach. Area Prepped: Entire Lower Lumbosacral Region Prepping solution: ChloraPrep (2% chlorhexidine gluconate and 70% isopropyl alcohol) Safety Precautions: Aspiration looking for blood return was conducted prior to all injections. At no point did we inject any substances, as a needle was being advanced. No attempts were made at seeking any paresthesias. Safe injection practices and needle disposal techniques used. Medications properly checked for expiration dates. SDV (single dose vial) medications used. Description of the Procedure: Protocol guidelines were followed. The patient was placed in position over the procedure table. The target area was identified and the area prepped in the usual manner. Skin & deeper tissues infiltrated with local anesthetic. Appropriate amount of time allowed to pass for local anesthetics to take effect. The procedure needle was advanced under fluoroscopic guidance into the sacroiliac joint until a firm endpoint was obtained. Proper needle placement secured. Negative aspiration confirmed. Solution injected in intermittent fashion, asking for systemic symptoms every 0.5cc of injectate. The needles were then removed and the area cleansed, making sure to leave some of the prepping solution back to take advantage of its long term bactericidal properties. Vitals:   05/02/20 1048 05/02/20 1052 05/02/20 1102 05/02/20 1112  BP: 117/65 (!) 114/100 (!) 133/54 138/60  Pulse:       Resp: 17 20 20 20   Temp:   97.9 F (36.6 C)   TempSrc:      SpO2: 100% 99% 100% 100%  Weight:      Height:        Start Time: 1040 hrs. End Time: 1052 hrs. Materials:  Needle(s) Type: Regular needle Gauge: 22G Length: 3.5-in Medication(s): Please see orders for medications and dosing details. 10 cc solution  made of 8 cc of 0.2% ropivacaine, 2 cc of Decadron 10 mg/cc.  2.5 cc injected intra-articular, 2.5 cc injected periarticular on each side. Imaging Guidance (Non-Spinal):  Type of Imaging Technique: Fluoroscopy Guidance (Non-Spinal) Indication(s): Assistance in needle guidance and placement for procedures requiring needle placement in or near specific anatomical locations not easily accessible without such assistance. Exposure Time: Please see nurses notes. Contrast: Before injecting any contrast, we confirmed that the patient did not have an allergy to iodine, shellfish, or radiological contrast. Once satisfactory needle placement was completed at the desired level, radiological contrast was injected. Contrast injected under live fluoroscopy. No contrast complications. See chart for type and volume of contrast used. Fluoroscopic Guidance: I was personally present during the use of fluoroscopy. "Tunnel Vision Technique" used to obtain the best possible view of the target area. Parallax error corrected before commencing the procedure. "Direction-depth-direction" technique used to introduce the needle under continuous pulsed fluoroscopy. Once target was reached, antero-posterior, oblique, and lateral fluoroscopic projection used confirm needle placement in all planes. Images permanently stored in EMR. Interpretation: I personally interpreted the imaging intraoperatively. Adequate needle placement confirmed in multiple planes. Appropriate spread of contrast into desired area was observed. No evidence of afferent or efferent intravascular uptake. Permanent images saved into the patient's  record.  Antibiotic Prophylaxis:   Anti-infectives (From admission, onward)   None     Indication(s): None identified  Post-operative Assessment:  Post-procedure Vital Signs:  Pulse/HCG Rate: 6562 Temp: 97.9 F (36.6 C) Resp: 20 BP: 138/60 SpO2: 100 %  EBL: None  Complications: No immediate post-treatment complications observed by team, or reported by patient.  Note: The patient tolerated the entire procedure well. A repeat set of vitals were taken after the procedure and the patient was kept under observation following institutional policy, for this type of procedure. Post-procedural neurological assessment was performed, showing return to baseline, prior to discharge. The patient was provided with post-procedure discharge instructions, including a section on how to identify potential problems. Should any problems arise concerning this procedure, the patient was given instructions to immediately contact us, at any time, without hesitation. In any case, we plan to contact the patient by telephone for a follow-up status report regarding this interventional procedure.  Comments:  No additional relevant information. 5 out of 5 strength bilateral lower extremity: Plantar flexion, dorsiflexion, knee flexion, knee extension.  Plan of Care    Imaging Orders     DG PAIN CLINIC C-ARM 1-60 MIN NO REPORT  Medications ordered for procedure: Meds ordered this encounter  Medications  . iohexol (OMNIPAQUE) 180 MG/ML injection 10 mL    Must be Myelogram-compatible. If not available, you may substitute with a water-soluble, non-ionic, hypoallergenic, myelogram-compatible radiological contrast medium.  Marland Kitchen lidocaine (XYLOCAINE) 2 % (with pres) injection 400 mg  . midazolam (VERSED) 5 MG/5ML injection 1-2 mg    Make sure Flumazenil is available in the pyxis when using this medication. If oversedation occurs, administer 0.2 mg IV over 15 sec. If after 45 sec no response, administer 0.2 mg again  over 1 min; may repeat at 1 min intervals; not to exceed 4 doses (1 mg)  . fentaNYL (SUBLIMAZE) injection 25-50 mcg    Make sure Narcan is available in the pyxis when using this medication. In the event of respiratory depression (RR< 8/min): Titrate NARCAN (naloxone) in increments of 0.1 to 0.2 mg IV at 2-3 minute intervals, until desired degree of reversal.  . ropivacaine (PF) 2 mg/mL (0.2%) (NAROPIN) injection 9  mL  . dexamethasone (DECADRON) injection 10 mg  . dexamethasone (DECADRON) injection 10 mg   Medications administered: We administered iohexol, lidocaine, midazolam, fentaNYL, ropivacaine (PF) 2 mg/mL (0.2%), dexamethasone, and dexamethasone.  See the medical record for exact dosing, route, and time of administration.  New Prescriptions   No medications on file   Disposition: Discharge home  Discharge Date & Time: 05/02/2020;   hrs.   Physician-requested Follow-up: Return for [facility] sch. appt, in person.  Future Appointments  Date Time Provider [place]  05/31/2020  9:40 AM Gillis Santa, MD [facility] None   Primary Care Physician: [facility] Location: [facility] Facility Note by: Gillis Santa, MD Date: 05/02/2020; Time: 11:22 AM  Disclaimer:  Medicine is not an exact science. The only guarantee in medicine is that nothing is guaranteed. It is important to note that the decision to proceed with this intervention was based on the information collected from the patient. The Data and conclusions were drawn from the patient's questionnaire, the interview, and the physical examination. Because the information was provided in large part by the patient, it cannot be guaranteed that it has not been purposely or unconsciously manipulated. Every effort has been made to obtain as much relevant data as possible for this evaluation. It is important to note that the conclusions that lead to this procedure are derived in large part from  the available data. Always take into account that the treatment will also be dependent on availability of resources and existing treatment guidelines, considered by other Pain Management Practitioners as being common knowledge and practice, at the time of the intervention. For Medico-Legal purposes, it is also important to point out that variation in procedural techniques and pharmacological choices are the acceptable norm. The indications, contraindications, technique, and results of the above procedure should only be interpreted and judged by a Board-Certified Interventional Pain Specialist with extensive familiarity and expertise in the same exact procedure and technique.

## 2020-05-02 NOTE — Progress Notes (Signed)
Safety precautions to be maintained throughout the outpatient stay will include: orient to surroundings, keep bed in low position, maintain call bell within reach at all times, provide assistance with transfer out of bed and ambulation.  

## 2020-05-03 ENCOUNTER — Telehealth: Payer: Self-pay

## 2020-05-03 NOTE — Telephone Encounter (Signed)
Post procedure follow up.  No answer 

## 2020-05-31 ENCOUNTER — Encounter: Payer: Self-pay | Admitting: Student in an Organized Health Care Education/Training Program

## 2020-05-31 ENCOUNTER — Ambulatory Visit
Payer: BC Managed Care – PPO | Attending: Student in an Organized Health Care Education/Training Program | Admitting: Student in an Organized Health Care Education/Training Program

## 2020-05-31 ENCOUNTER — Telehealth: Payer: Self-pay | Admitting: *Deleted

## 2020-05-31 ENCOUNTER — Other Ambulatory Visit: Payer: Self-pay

## 2020-05-31 DIAGNOSIS — M5136 Other intervertebral disc degeneration, lumbar region: Secondary | ICD-10-CM

## 2020-05-31 DIAGNOSIS — M47818 Spondylosis without myelopathy or radiculopathy, sacral and sacrococcygeal region: Secondary | ICD-10-CM | POA: Diagnosis not present

## 2020-05-31 DIAGNOSIS — G894 Chronic pain syndrome: Secondary | ICD-10-CM

## 2020-05-31 DIAGNOSIS — Z79891 Long term (current) use of opiate analgesic: Secondary | ICD-10-CM | POA: Diagnosis not present

## 2020-05-31 DIAGNOSIS — M533 Sacrococcygeal disorders, not elsewhere classified: Secondary | ICD-10-CM

## 2020-05-31 DIAGNOSIS — M47816 Spondylosis without myelopathy or radiculopathy, lumbar region: Secondary | ICD-10-CM

## 2020-05-31 DIAGNOSIS — M7918 Myalgia, other site: Secondary | ICD-10-CM

## 2020-05-31 MED ORDER — OXYCODONE-ACETAMINOPHEN 5-325 MG PO TABS: 1.0000 | ORAL_TABLET | Freq: Three times a day (TID) | ORAL | 0 refills | Status: DC | PRN

## 2020-05-31 NOTE — Progress Notes (Signed)
Patient: [name]  Service Category: E/M  Provider: Gillis Santa, MD  DOB: 1970/11/22  DOS: 05/31/2020  Location: Office  MRN: [id number]  Setting: Ambulatory outpatient  Referring Provider: [facility]  Type: Established Patient  Specialty: Interventional Pain Management  PCP: [facility]  Location: Home  Delivery: TeleHealth     Virtual Encounter - Pain Management PROVIDER NOTE: Information contained herein reflects review and annotations entered in association with encounter. Interpretation of such information and data should be left to medically-trained personnel. Information provided to patient can be located elsewhere in the medical record under "Patient Instructions". Document created using STT-dictation technology, any transcriptional errors that may result from process are unintentional.    Contact & Pharmacy Preferred: [place]: [phone] (home) Mobile: [phone] (mobile) E-mail: No e-mail address on record  [place], [place] - [address] [place] [address] Phone: [phone] Fax: [phone]   Pre-screening  [name] offered "in-person" vs "virtual" encounter. She indicated preferring virtual for this encounter.   Reason COVID-19*  Social distancing based on CDC and AMA recommendations.   I contacted [name] on 05/31/2020 via video conference.      I clearly identified myself as Gillis Santa, MD. I verified that I was speaking with the correct person using two identifiers (Name: [name], and date of birth: 1970-06-15).  Consent I sought verbal advanced consent from [name] for virtual visit interactions. I informed [name] of possible security and privacy concerns, risks, and limitations associated with providing "not-in-person" medical evaluation and management services. I also informed [name] of the availability of "in-person"  appointments. Finally, I informed her that there would be a charge for the virtual visit and that she could be  personally, fully or partially, financially responsible for it. [name] expressed understanding and agreed to proceed.   Historic Elements   [name] is a 50 y.o. year old, female patient evaluated today after our last contact on 05/02/2020. [name]  has a past medical history of Chronic back pain, Colitis, Hypertension, Migraine, and Plumbism. She also  has a past surgical history that includes Abdominal hysterectomy; Cholecystectomy; Hand surgery; and Back surgery. [name] has a current medication list which includes the following prescription(s): oxycodone-acetaminophen, azelastine, clonazepam, fluticasone, hydroxyzine, metoprolol succinate, omeprazole, [START ON 06/30/2020] oxycodone-acetaminophen, [START ON 07/30/2020] oxycodone-acetaminophen, and temazepam. She  reports that she has been smoking cigarettes. She has a 20.00 pack-year smoking history. She has never used smokeless tobacco. She reports that she does not drink alcohol and does not use drugs. [name] is allergic to buprenorphine, cefaclor, gabapentin, ibuprofen, levofloxacin, macrobid [nitrofurantoin monohyd macro], naproxen, and penicillins.   HPI  Today, she is being contacted for both, medication management and a post-procedure assessment.  Conducting a virtual visit today due to inclement weather and patient not being able to get out of her house due to the snow Noticing significant pain relief after her bilateral sacroiliac joint injection.  States that in combination with her medication is providing her with greater than 90% pain relief and improvement in her functional status.  She is very pleased with her pain relief at this time.  We will refill her Percocet as below.  Follow-up in 3 months or earlier if she [name] to have an SI joint injection prior.  Patient endorsed understanding.  Post-Procedure  Evaluation  Procedure (05/02/2020):  Type: Therapeutic Sacroiliac Joint  Steroid Injection #4  Region: Inferior Lumbosacral Region Level: PIIS (Posterior Inferior Iliac Spine) Laterality: Bilateral  Sedation: Please see nurses note.  Effectiveness during initial hour after procedure(Ultra-Short Term Relief): 100%  Local anesthetic used: Long-acting (4-6 hours) Effectiveness: Defined as any analgesic benefit obtained secondary to the administration of local anesthetics. This carries significant diagnostic value as to the etiological location, or anatomical origin, of the pain. Duration of benefit is expected to coincide with the duration of the local anesthetic used.  Effectiveness during initial 4-6 hours after procedure(Short-Term Relief): 100%  Long-term benefit: Defined as any relief past the pharmacologic duration of the local anesthetics.  Effectiveness past the initial 6 hours after procedure(Long-Term Relief): 100%  Current benefits: Defined as benefit that persist at this time.   Analgesia:  >50% relief Function: [name] reports improvement in function ROM: [name] reports improvement in ROM   Pharmacotherapy Assessment   05/02/2020  03/01/2020   1  Oxycodone-Acetaminophen 5-325  75.00  30  Un Pha  [id number]  Nor (6917)  0/0  18.75 MME  Comm Ins  [place]      Analgesic: Percocet 5 mg twice daily as needed with an extra #15/month   Monitoring: [place] PMP: PDMP reviewed during this encounter.       Pharmacotherapy: No side-effects or adverse reactions reported. Compliance: No problems identified. Effectiveness: Clinically acceptable. Plan: Refer to "POC".  UDS:  Summary  Date Value Ref Range Status  01/07/2020 Note  Final    Comment:    ==================================================================== [employer] [facility] [employer] (MW) ==================================================================== Test                             Result       Flag       Units  Drug  Present and Declared for Prescription Verification   Oxazepam                       >3125        EXPECTED   ng/mg creat   Temazepam                      >3125        EXPECTED   ng/mg creat    Oxazepam and temazepam are expected metabolites of diazepam.    Oxazepam is also an expected metabolite of other benzodiazepine    drugs, including chlordiazepoxide, prazepam, clorazepate, halazepam,    and temazepam.  Oxazepam and temazepam are available as scheduled    prescription medications.    7-aminoclonazepam              1298         EXPECTED   ng/mg creat    7-aminoclonazepam is an expected metabolite of clonazepam. Source of    clonazepam is a scheduled prescription medication.    Oxycodone                      >56213       EXPECTED   ng/mg creat   Oxymorphone                    13241        EXPECTED   ng/mg creat   Noroxycodone                   >08657       EXPECTED  ng/mg creat   Noroxymorphone                 6861         EXPECTED   ng/mg creat    Sources of oxycodone are scheduled prescription medications.    Oxymorphone, noroxycodone, and noroxymorphone are expected    metabolites of oxycodone. Oxymorphone is also available as a    scheduled prescription medication.  Drug Absent but Declared for Prescription Verification   Hydrocodone                    Not Detected UNEXPECTED ng/mg creat   Buprenorphine                  Not Detected UNEXPECTED ng/mg creat    Low dose buprenorphine is not always detected even when used as    directed.  ==================================================================== Test                      Result    Flag   Units      Ref Range   Creatinine              64               mg/dL      >=20 ==================================================================== Declared Medications:  The flagging and interpretation on this report are based on the  following declared medications.  Unexpected results may arise from  inaccuracies in the declared  medications.   **Note: The testing scope of this panel includes these medications:   Clonazepam (Klonopin)  Hydrocodone (Hydromet)  Oxycodone (Percocet)  Temazepam (Restoril)   **Note: The testing scope of this panel does not include small to  moderate amounts of these reported medications:   Buprenorphine   **Note: The testing scope of this panel does not include the  following reported medications:   Acetaminophen (Percocet)  Azelastine (Astelin)  Cefaclor  Escitalopram (Lexapro)  Famotidine (Pepcid)  Gabapentin  Homatropine (Hydromet)  Hydroxyzine (Atarax)  Ibuprofen  Levofloxacin  Metoprolol (Toprol)  Naproxen  Nitrofurantoin (Macrobid)  Omeprazole (Prilosec)  Ondansetron (Zofran)  Penicillin ==================================================================== For clinical consultation, please call [phone]. ====================================================================     Laboratory Chemistry Profile   Renal Lab Results  Component Value Date   BUN 16 01/17/2020   CREATININE 0.86 01/17/2020   GFRAA >60 01/17/2020   GFRNONAA >60 01/17/2020     Hepatic Lab Results  Component Value Date   AST 21 01/17/2020   ALT 17 01/17/2020   ALBUMIN 4.1 01/17/2020   ALKPHOS 120 01/17/2020   LIPASE 27 01/17/2020     Electrolytes Lab Results  Component Value Date   NA 144 01/17/2020   K 3.3 (L) 01/17/2020   CL 107 01/17/2020   CALCIUM 9.4 01/17/2020     Bone No results found for: VD25OH, VD125OH2TOT, WI0973ZH2, DJ2426ST4, 25OHVITD1, 25OHVITD2, 25OHVITD3, TESTOFREE, TESTOSTERONE   Inflammation (CRP: Acute Phase) (ESR: Chronic Phase) No results found for: CRP, ESRSEDRATE, LATICACIDVEN     Note: Above Lab results reviewed.   Assessment  The primary encounter diagnosis was Sacroiliac joint pain. Diagnoses of Chronic pain syndrome, SI joint arthritis, Long term prescription opiate use, Lumbar spondylosis, Myofascial pain, and Lumbar degenerative  disc disease were also pertinent to this visit.  Plan of Care   [name] has a current medication list which includes the following long-term medication(s): oxycodone-acetaminophen, azelastine, clonazepam, fluticasone, metoprolol succinate, omeprazole, [START ON 06/30/2020] oxycodone-acetaminophen, [START ON 07/30/2020]  oxycodone-acetaminophen, and temazepam.  Pharmacotherapy (Medications Ordered): Meds ordered this encounter  Medications  . oxyCODONE-acetaminophen (PERCOCET) 5-325 MG tablet    Sig: Take 1-2 tablets by mouth every 8 (eight) hours as needed for severe pain.    Dispense:  75 tablet    Refill:  0    Do not place this medication, or any other prescription from our practice, on "Automatic Refill". Patient may have prescription filled one day early if pharmacy is closed on scheduled refill date.  Marland Kitchen oxyCODONE-acetaminophen (PERCOCET) 5-325 MG tablet    Sig: Take 1-2 tablets by mouth every 8 (eight) hours as needed for severe pain.    Dispense:  75 tablet    Refill:  0    Do not place this medication, or any other prescription from our practice, on "Automatic Refill". Patient may have prescription filled one day early if pharmacy is closed on scheduled refill date.  Marland Kitchen oxyCODONE-acetaminophen (PERCOCET) 5-325 MG tablet    Sig: Take 1-2 tablets by mouth every 8 (eight) hours as needed for severe pain.    Dispense:  75 tablet    Refill:  0    Do not place this medication, or any other prescription from our practice, on "Automatic Refill". Patient may have prescription filled one day early if pharmacy is closed on scheduled refill date.   Orders:  Orders Placed This Encounter  Procedures  . SACROILIAC JOINT INJECTION    Scheduling timeframe: (PRN procedure) [name] will call when needed. Clinical indication: Low back pain, w/ or w/o groin pain. Sacroiliac joint pain Sedation: Usually done with sedation. (May be done without sedation if so desired by  patient.) Requirements: NPO x 8 hrs.; Driver; Stop blood thinners.    Standing Status:   Standing    Number of Occurrences:   1    Standing Expiration Date:   05/31/2021    Scheduling Instructions:     Side: Bilateral     Sedation: Patient's choice.    Order Specific Question:   Where will this procedure be performed?    Answer:   [facility]   Follow-up plan:   Return in about 3 months (around 08/29/2020) for Medication Management, in person.   Recent Visits Date Type Provider Dept  05/02/20 Procedure visit Gillis Santa, MD [facility]  Showing recent visits within past 90 days and meeting all other requirements Today's Visits Date Type Provider Dept  05/31/20 Telemedicine Gillis Santa, MD [facility]  Showing today's visits and meeting all other requirements Future Appointments No visits were found meeting these conditions. Showing future appointments within next 90 days and meeting all other requirements  I discussed the assessment and treatment plan with the patient. The patient was provided an opportunity to ask questions and all were answered. The patient agreed with the plan and demonstrated an understanding of the instructions.  Patient advised to call back or seek an in-person evaluation if the symptoms or condition worsens.  Duration of encounter: 28mnutes.  Note by: BGillis Santa MD Date: 05/31/2020; Time: 3:11 PM

## 2020-05-31 NOTE — Telephone Encounter (Signed)
Attempted to call for pre appointment review of allergies/meds. No voicemail set up at mobile number, no answer at home number, no voicemail.

## 2020-07-14 ENCOUNTER — Other Ambulatory Visit: Payer: Self-pay

## 2020-07-14 ENCOUNTER — Emergency Department (HOSPITAL_COMMUNITY)
Admission: EM | Admit: 2020-07-14 | Discharge: 2020-07-14 | Disposition: A | Discharge status: Home / Self Care | Payer: BC Managed Care – PPO | Attending: Emergency Medicine | Admitting: Emergency Medicine

## 2020-07-14 ENCOUNTER — Encounter (HOSPITAL_COMMUNITY): Payer: Self-pay

## 2020-07-14 ENCOUNTER — Emergency Department (HOSPITAL_COMMUNITY): Payer: BC Managed Care – PPO

## 2020-07-14 DIAGNOSIS — Z79899 Other long term (current) drug therapy: Secondary | ICD-10-CM | POA: Diagnosis not present

## 2020-07-14 DIAGNOSIS — F17210 Nicotine dependence, cigarettes, uncomplicated: Secondary | ICD-10-CM | POA: Insufficient documentation

## 2020-07-14 DIAGNOSIS — N281 Cyst of kidney, acquired: Secondary | ICD-10-CM | POA: Diagnosis not present

## 2020-07-14 DIAGNOSIS — R112 Nausea with vomiting, unspecified: Secondary | ICD-10-CM | POA: Diagnosis not present

## 2020-07-14 DIAGNOSIS — R1084 Generalized abdominal pain: Secondary | ICD-10-CM | POA: Diagnosis present

## 2020-07-14 DIAGNOSIS — I10 Essential (primary) hypertension: Secondary | ICD-10-CM | POA: Diagnosis not present

## 2020-07-14 DIAGNOSIS — K529 Noninfective gastroenteritis and colitis, unspecified: Secondary | ICD-10-CM

## 2020-07-14 LAB — URINALYSIS, ROUTINE W REFLEX MICROSCOPIC
Bilirubin Urine: NEGATIVE
Glucose, UA: NEGATIVE mg/dL
Ketones, ur: 20 mg/dL — AB
Leukocytes,Ua: NEGATIVE
Nitrite: NEGATIVE
Protein, ur: 100 mg/dL — AB
Specific Gravity, Urine: 1.01 (ref 1.005–1.030)
pH: 5 (ref 5.0–8.0)

## 2020-07-14 LAB — CBC WITH DIFFERENTIAL/PLATELET
Abs Immature Granulocytes: 0.04 10*3/uL (ref 0.00–0.07)
Basophils Absolute: 0.1 10*3/uL (ref 0.0–0.1)
Basophils Relative: 1 %
Eosinophils Absolute: 0 10*3/uL (ref 0.0–0.5)
Eosinophils Relative: 0 %
HCT: 48.1 % — ABNORMAL HIGH (ref 36.0–46.0)
Hemoglobin: 15.5 g/dL — ABNORMAL HIGH (ref 12.0–15.0)
Immature Granulocytes: 1 %
Lymphocytes Relative: 14 %
Lymphs Abs: 1.2 10*3/uL (ref 0.7–4.0)
MCH: 31.7 pg (ref 26.0–34.0)
MCHC: 32.2 g/dL (ref 30.0–36.0)
MCV: 98.4 fL (ref 80.0–100.0)
Monocytes Absolute: 0.3 10*3/uL (ref 0.1–1.0)
Monocytes Relative: 4 %
Neutro Abs: 6.7 10*3/uL (ref 1.7–7.7)
Neutrophils Relative %: 80 %
Platelets: 338 10*3/uL (ref 150–400)
RBC: 4.89 MIL/uL (ref 3.87–5.11)
RDW: 17.3 % — ABNORMAL HIGH (ref 11.5–15.5)
WBC: 8.3 10*3/uL (ref 4.0–10.5)
nRBC: 0 % (ref 0.0–0.2)

## 2020-07-14 LAB — COMPREHENSIVE METABOLIC PANEL
ALT: 16 U/L (ref 0–44)
AST: 34 U/L (ref 15–41)
Albumin: 4.1 g/dL (ref 3.5–5.0)
Alkaline Phosphatase: 148 U/L — ABNORMAL HIGH (ref 38–126)
Anion gap: 14 (ref 5–15)
BUN: 13 mg/dL (ref 6–20)
CO2: 22 mmol/L (ref 22–32)
Calcium: 9 mg/dL (ref 8.9–10.3)
Chloride: 104 mmol/L (ref 98–111)
Creatinine, Ser: 1.03 mg/dL — ABNORMAL HIGH (ref 0.44–1.00)
GFR, Estimated: >60 mL/min (ref >60)
Glucose, Bld: 124 mg/dL — ABNORMAL HIGH (ref 70–99)
Potassium: 4.7 mmol/L (ref 3.5–5.1)
Sodium: 140 mmol/L (ref 135–145)
Total Bilirubin: 0.9 mg/dL (ref 0.3–1.2)
Total Protein: 8.5 g/dL — ABNORMAL HIGH (ref 6.5–8.1)

## 2020-07-14 LAB — LIPASE, BLOOD: Lipase: 26 U/L (ref 11–51)

## 2020-07-14 IMAGING — CT CT ABD-PELV W/ CM
2 of 5 series · 16 of 46 positions shown, 18 images · IV contrast (Omnipaque or Isovue)
Comparison: CT abdomen pelvis [DATE].

CLINICAL DATA: Abdominal pain, history of colitis.

EXAM:
CT ABDOMEN AND PELVIS WITH CONTRAST
TECHNIQUE: Multidetector CT imaging of the abdomen and pelvis was performed
using the standard protocol following bolus administration of
intravenous contrast.
CONTRAST:  100mL OMNIPAQUE IOHEXOL 300 MG/ML  SOLN

[Series 2: axial st · axial · 0.71mm/px · z∈[+884,+1244]mm · 13 of 82 slices shown, 15 images]
[im 5/82  soft-tissue]
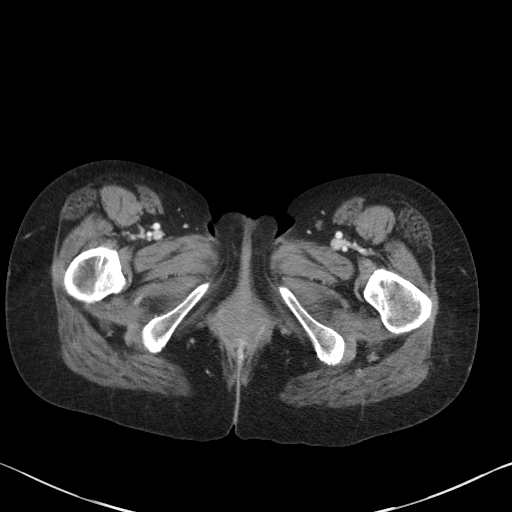
[im 5/82  bone]
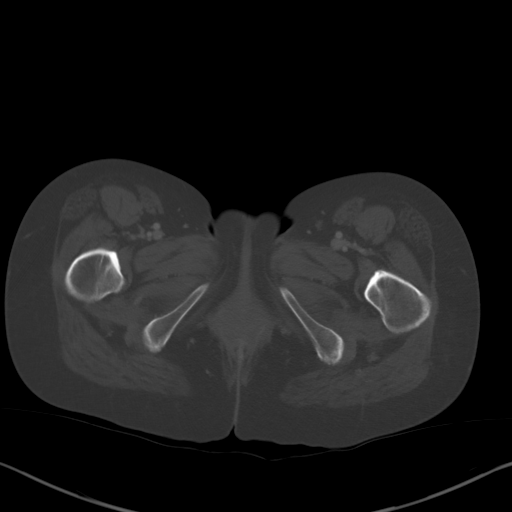
[im 10/82  soft-tissue]
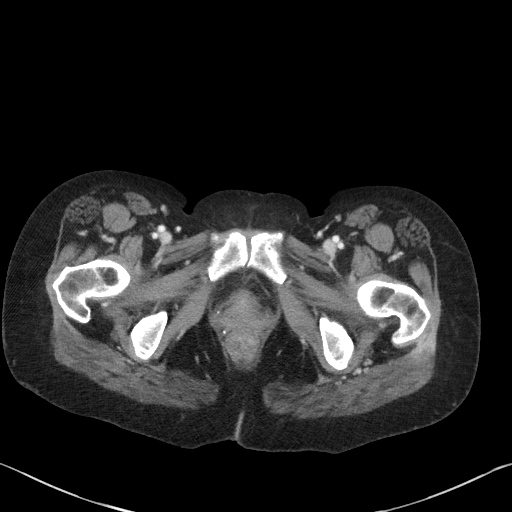
[im 20/82  soft-tissue]
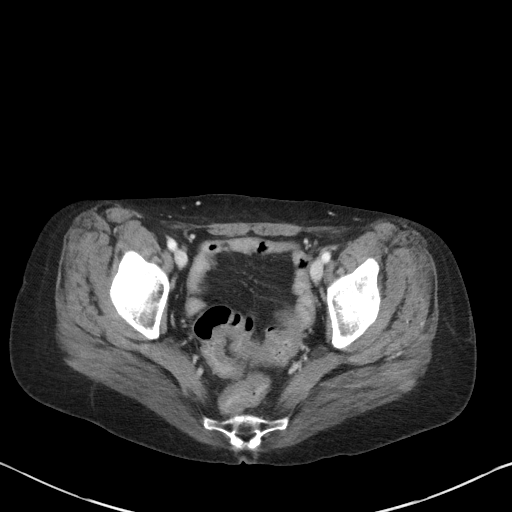
[im 24/82  soft-tissue]
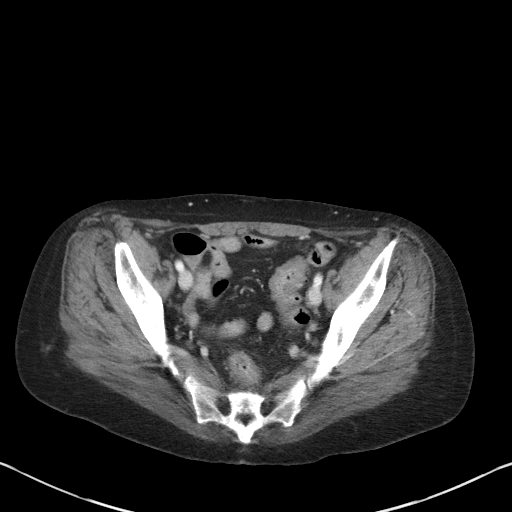
[im 29/82  soft-tissue]
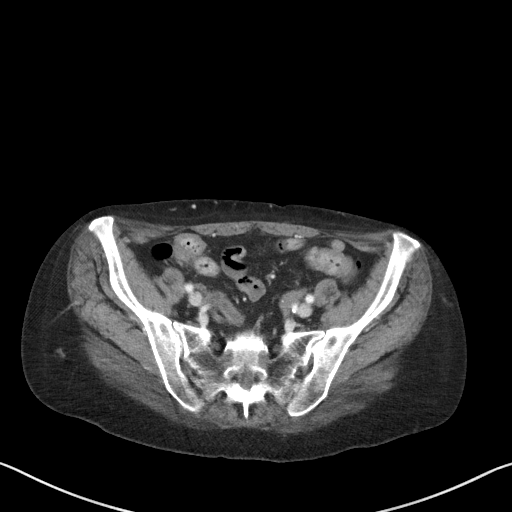
[im 34/82  soft-tissue]
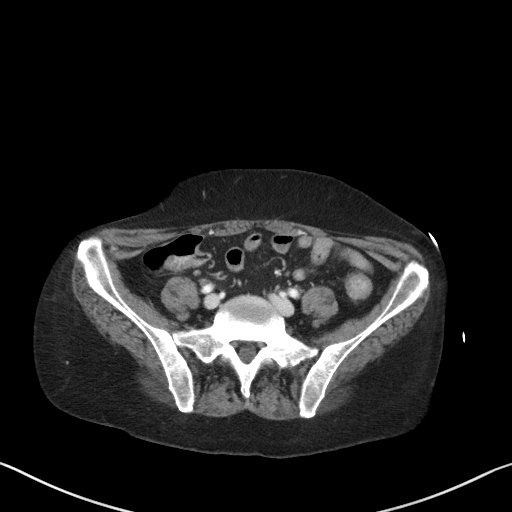
[im 43/82  soft-tissue]
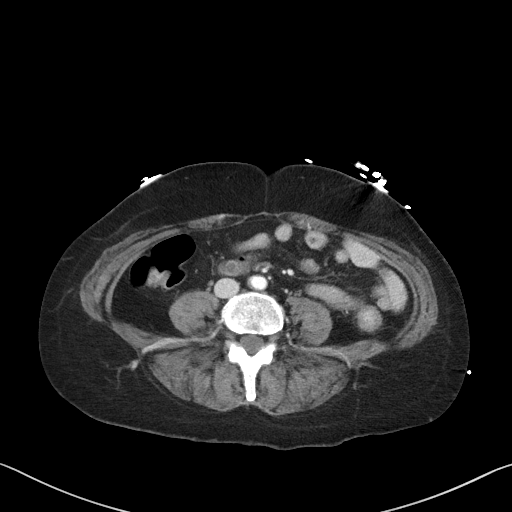
[im 48/82  soft-tissue]
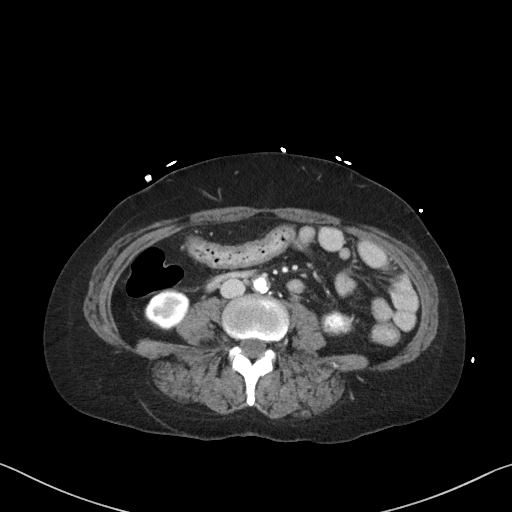
[im 53/82  soft-tissue]
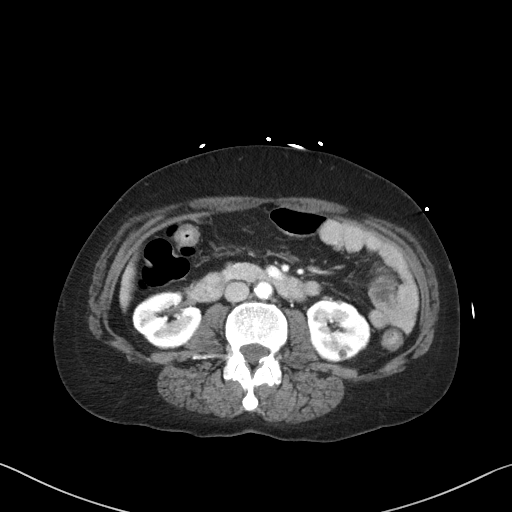
[im 53/82  bone]
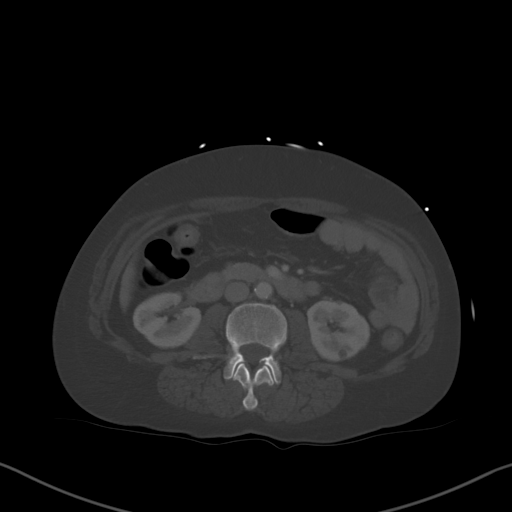
[im 58/82  soft-tissue]
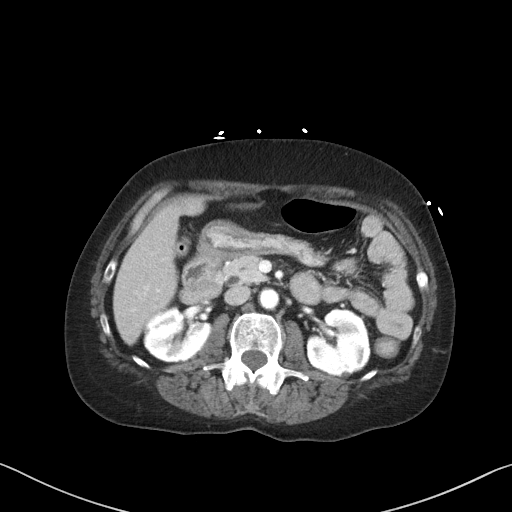
[im 62/82  soft-tissue]
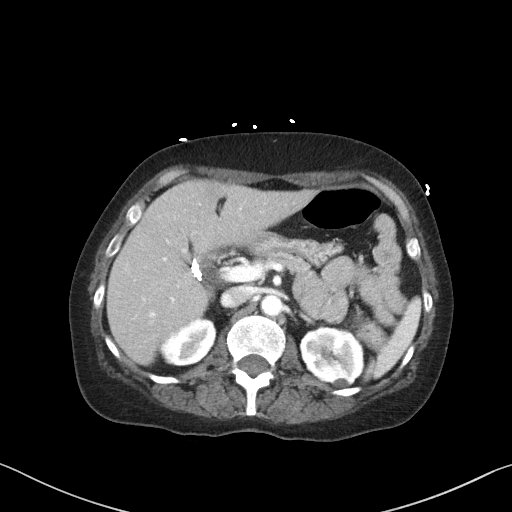
[im 72/82  soft-tissue]
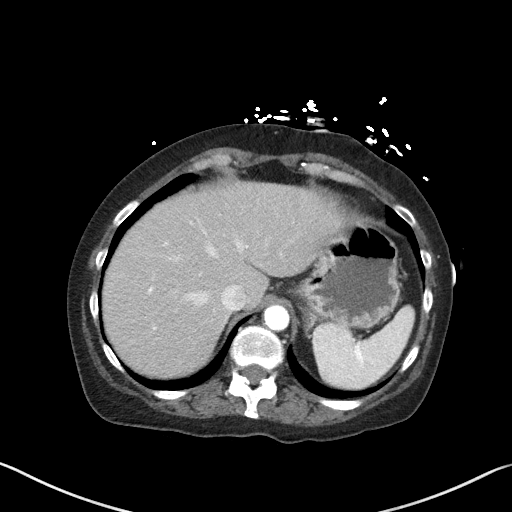
[im 77/82  soft-tissue]
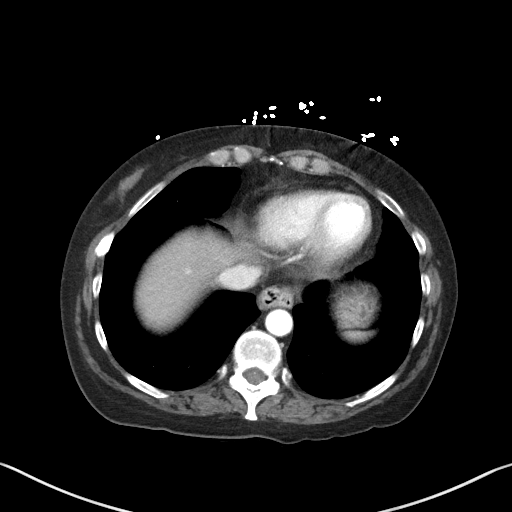

[Series 7: coronal st · coronal · 0.71mm/px · 3 of 93 slices shown]
[im 31/93  soft-tissue]
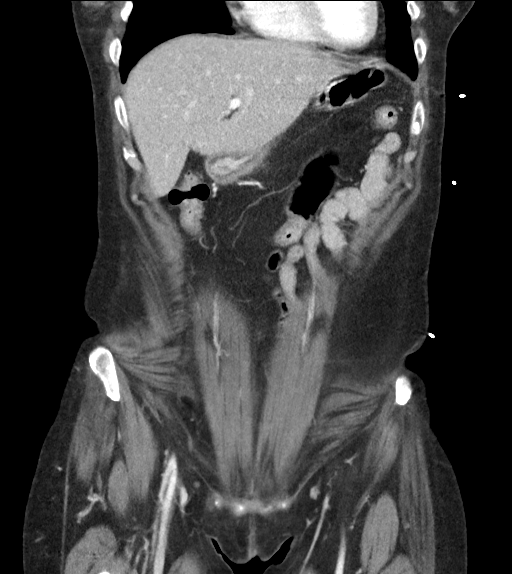
[im 41/93  soft-tissue]
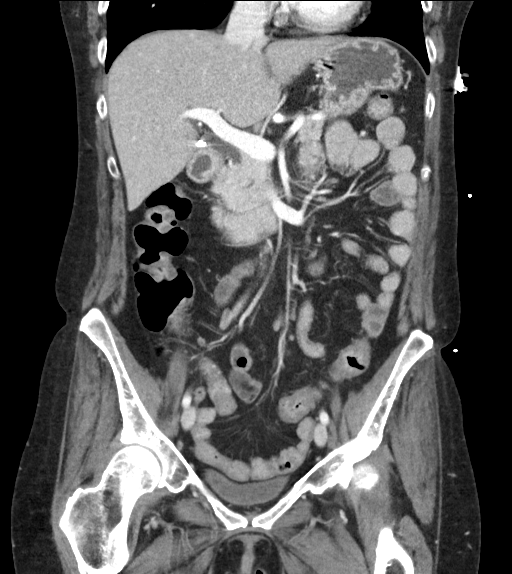
[im 52/93  soft-tissue]
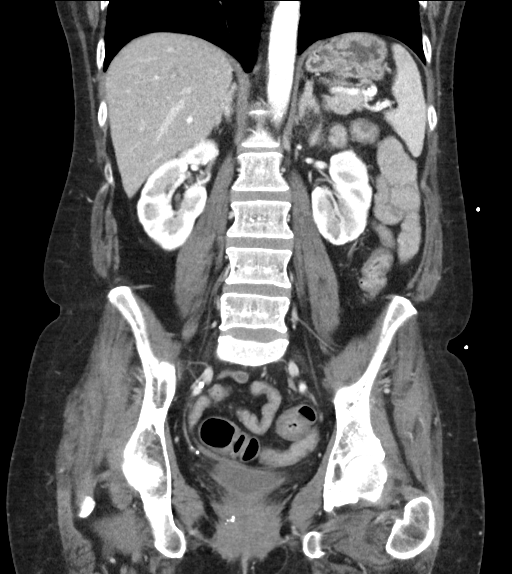

[16 of 46 positions shown; findings below may reference images not displayed]

FINDINGS: Lower chest: No acute abnormality. Normal size heart. No pericardial
effusion. Small hiatal hernia.

Hepatobiliary: Diffuse hepatic steatosis. No suspicious liver
lesion. Cholecystectomy. Similar mild reservoir effect of the
biliary tree.

Pancreas: Unremarkable

Spleen: Unremarkable

Adrenals/Urinary Tract: Adrenal glands are unremarkable. Interval
decrease in size in the multi septated exophytic left upper pole
cystic renal lesion which now measures 4 cm previously 5.4 cm in
maximum axial dimension on image [DATE]. Other smaller scattered tiny
hypodense renal lesions, which are technically too small to
accurately characterize but favor renal cysts. Urinary bladder is
unremarkable for degree of distension.

Stomach/Bowel: Stomach is unremarkable. No suspicious small bowel
wall thickening or dilation. There is long segment thickening of the
colon extending from the rectum through the ascending colon. The
appendix is gas and fluid filled with mild distension measuring 8
mm, this is slowly increased in size over multiple priors.

Vascular/Lymphatic: No significant vascular findings are present. No
enlarged abdominal or pelvic lymph nodes.

Reproductive: Status post hysterectomy. No adnexal masses.

Other: No abdominal ascites or pneumoperitoneum. Sequela of
subcutaneous injections versus trauma.

Musculoskeletal: No acute or significant osseous findings.
IMPRESSION: 1. Long segment mild colonic wall thickening extending from the
rectum through the ascending colon, most consistent with colitis,
which may be infectious or inflammatory in etiology.
2. The appendix is distended with gas and fluid without adjacent
inflammatory stranding, but with slowly increase and distension over
multiple prior studies. Findings are which are nonspecific and
likely reflect a chronic process such as a possible appendiceal
mucocele, consider nonemergent surgical consult.
3. Interval decrease in size of the multi septated exophytic left
upper pole cystic renal lesion, which does not definitively denote
this as a benign cystic lesion. Recommend more definitive
characterization with nonemergent renal protocol MRI with and
without contrast.

## 2020-07-14 MED ORDER — PROMETHAZINE HCL 25 MG/ML IJ SOLN
12.5000 mg | Freq: Once | INTRAMUSCULAR | Status: AC
  Administered 2020-07-14: 12.5 mg via INTRAVENOUS
  Filled 2020-07-14: qty 1

## 2020-07-14 MED ORDER — MORPHINE SULFATE (PF) 2 MG/ML IV SOLN
2.0000 mg | Freq: Once | INTRAVENOUS | Status: AC
  Administered 2020-07-14: 2 mg via INTRAVENOUS
  Filled 2020-07-14: qty 1

## 2020-07-14 MED ORDER — FENTANYL CITRATE (PF) 100 MCG/2ML IJ SOLN
50.0000 ug | Freq: Once | INTRAMUSCULAR | Status: AC
  Administered 2020-07-14: 50 ug via INTRAVENOUS
  Filled 2020-07-14: qty 2

## 2020-07-14 MED ORDER — PROMETHAZINE HCL 25 MG PO TABS: 25.0000 mg | ORAL_TABLET | Freq: Four times a day (QID) | ORAL | 0 refills | Status: DC | PRN

## 2020-07-14 MED ORDER — METOPROLOL TARTRATE 5 MG/5ML IV SOLN
5.0000 mg | Freq: Once | INTRAVENOUS | Status: AC
  Administered 2020-07-14: 5 mg via INTRAVENOUS
  Filled 2020-07-14: qty 5

## 2020-07-14 MED ORDER — MORPHINE SULFATE (PF) 4 MG/ML IV SOLN
4.0000 mg | Freq: Once | INTRAVENOUS | Status: AC
  Administered 2020-07-14: 4 mg via INTRAVENOUS
  Filled 2020-07-14: qty 1

## 2020-07-14 MED ORDER — ONDANSETRON HCL 4 MG/2ML IJ SOLN
4.0000 mg | Freq: Once | INTRAMUSCULAR | Status: AC
  Administered 2020-07-14: 4 mg via INTRAVENOUS
  Filled 2020-07-14: qty 2

## 2020-07-14 MED ORDER — IOHEXOL 300 MG/ML  SOLN
100.0000 mL | Freq: Once | INTRAMUSCULAR | Status: AC | PRN
  Administered 2020-07-14: 100 mL via INTRAVENOUS

## 2020-07-14 NOTE — ED Notes (Signed)
Pt resting at this time in bed. In no distress. Bed lowered and locked. Side rails upx2. Call bell in reach.

## 2020-07-14 NOTE — ED Notes (Addendum)
Pt lying in bed at this time sleeping. Equal rise and fall of chest. Vss. Pt bed lowered  and locked with call bell in reach. Will continue to monitor patient.

## 2020-07-14 NOTE — ED Notes (Signed)
Pt ambulated to the restroom without difficulty. Pt states she feels much better at this time.

## 2020-07-14 NOTE — ED Notes (Signed)
Gave pt container to collect stool sample at home

## 2020-07-14 NOTE — ED Triage Notes (Signed)
Pt brought to ED by husband with complaints of generalized abdominal pain with N/V/D that started last night. She states she does no have her gallbladder. She states she is unable to keep anything down.

## 2020-07-14 NOTE — ED Provider Notes (Addendum)
[facility] EMERGENCY DEPARTMENT Provider Note   CSN: [id number] Arrival date & time: 07/14/20  1026     History Chief Complaint  Patient presents with  . Abdominal Pain    [name] is a 50 y.o. female with history of HTN, distant history of colitis and chronic back pain presenting with a nausea, vomiting and generalized abdominal pain which started last night.  She describes waxing and waning sharp and cramping pain symptoms which have been worsening this am.  She reports non bloody diarrhea  x 1 today prior to arrival.  She has been unable to keep her any po intake down today including her blood pressure and pain medicines.  She endorses a prior similar episode, stating she was diagnosed with colitis more than 10 years ago and was seen by a GI specialist at [facility].  She underwent colonoscopy but was never diagnosed with UC or Crohn's disease.  She denies fevers or chills.  She denies hematemesis.  She denies abdominal distention, there is no radiation of pain into her back or chest.  She denies shortness of breath.  She also denies dysuria or hematuria.  She has had no treatments for her symptoms prior to arrival.  No exposures to others with similar symptoms, no suspicious food intake.  No recent antibiotic use.   HPI     Past Medical History:  Diagnosis Date  . Chronic back pain   . Colitis   . Hypertension   . Migraine   . Plumbism    blood clot     Patient Active Problem List   Diagnosis Date Noted  . Sacroiliac joint pain 01/07/2020  . 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 Long term prescription opiate use 05/19/2018  . Lumbar radiculopathy 02/04/2017  . Lumbar degenerative disc disease 02/04/2017  . Lumbar spondylosis 02/04/2017  . Chronic low back pain with bilateral sciatica 02/04/2017  . Chronic pain syndrome 02/04/2017  . Lumbar sprain 02/04/2017  . Chronic migraine without aura without status migrainosus, not intractable 02/04/2017    Past Surgical History:   Procedure Laterality Date  . ABDOMINAL HYSTERECTOMY    . BACK SURGERY    . CHOLECYSTECTOMY    . HAND SURGERY       OB History    Gravida  2   Para  2   Term  2   Preterm      AB      Living  2     SAB      IAB      Ectopic      Multiple      Live Births              Family History  Problem Relation Age of Onset  . Cancer Other   . Seizures Other   . Stroke Other   . Diabetes Other     Social History   Tobacco Use  . Smoking status: Current Every Day Smoker    Packs/day: 1.00    Years: 20.00    Pack years: 20.00    Types: Cigarettes  . Smokeless tobacco: Never Used  Vaping Use  . Vaping Use: Never used  Substance Use Topics  . Alcohol use: No  . Drug use: No    Home Medications Prior to Admission medications   Medication Sig Start Date End Date Taking? Authorizing Provider  azelastine (ASTELIN) 0.1 % nasal spray Place 2 sprays into both nostrils 2 (two) times daily. 12/21/19  Yes [provider]  clonazePAM (KLONOPIN) 0.5 MG tablet Take 0.5 mg by mouth daily as needed for anxiety.  09/12/19  Yes [provider]  fluticasone (FLONASE) 50 MCG/ACT nasal spray SMARTSIG:1 Both Nares Every Night 12/21/19  Yes [provider]  hydrOXYzine (ATARAX/VISTARIL) 25 MG tablet Take 25 mg by mouth 3 (three) times daily as needed for anxiety.  03/04/19  Yes [provider]  metoprolol succinate (TOPROL-XL) 100 MG 24 hr tablet Take 100 mg by mouth in the morning. Take with or immediately following a meal.   Yes [provider]  oxyCODONE-acetaminophen (PERCOCET) 5-325 MG tablet Take 1-2 tablets by mouth every 8 (eight) hours as needed for severe pain. 06/30/20 07/30/20 Yes Gillis Santa, MD  promethazine (PHENERGAN) 25 MG tablet Take 1 tablet (25 mg total) by mouth every 6 (six) hours as needed for nausea or vomiting. 07/14/20  Yes , Almyra Free, PA-C  temazepam (RESTORIL) 30 MG capsule Take 30 mg by mouth at bedtime as needed for  sleep.  01/17/16  Yes [provider]  chlorpheniramine-HYDROcodone (TUSSIONEX) 10-8 MG/5ML SUER Take 5 mLs by mouth in the morning and at bedtime. Patient not taking: No sig reported 07/06/20   [provider]  clindamycin (CLEOCIN) 300 MG capsule Take 300 mg by mouth 3 (three) times daily. Patient not taking: No sig reported 06/30/20   [provider]  omeprazole (PRILOSEC) 20 MG capsule Take 1 capsule (20 mg total) by mouth daily. Patient not taking: Reported on 07/14/2020 12/28/19   Ezequiel Essex, MD  oxyCODONE-acetaminophen (PERCOCET) 5-325 MG tablet Take 1-2 tablets by mouth every 8 (eight) hours as needed for severe pain. 05/31/20 06/30/20  Gillis Santa, MD  oxyCODONE-acetaminophen (PERCOCET) 5-325 MG tablet Take 1-2 tablets by mouth every 8 (eight) hours as needed for severe pain. Patient not taking: No sig reported 07/30/20 08/29/20  Gillis Santa, MD    Allergies    Buprenorphine, Cefaclor, Gabapentin, Ibuprofen, Levofloxacin, Macrobid [nitrofurantoin monohyd macro], Naproxen, and Penicillins  Review of Systems   Review of Systems  Constitutional: Positive for fatigue. Negative for chills and fever.  HENT: Negative for congestion and sore throat.   Eyes: Negative.   Respiratory: Negative for chest tightness and shortness of breath.   Cardiovascular: Negative for chest pain.  Gastrointestinal: Positive for abdominal pain, diarrhea, nausea and vomiting. Negative for abdominal distention and blood in stool.  Genitourinary: Negative.  Negative for dysuria, hematuria and urgency.  Musculoskeletal: Negative for arthralgias, joint swelling and neck pain.  Skin: Negative.  Negative for rash and wound.  Neurological: Negative for dizziness, weakness, light-headedness, numbness and headaches.  Psychiatric/Behavioral: Negative.     Physical Exam Updated Vital Signs BP (!) 188/75   Pulse 93   Temp 98.6 F (37 C) (Oral)   Resp 19   Wt 52.2 kg   SpO2 97%   BMI  21.73 kg/m   Physical Exam Vitals and nursing note reviewed.  Constitutional:      Appearance: She is well-developed and well-nourished.  HENT:     Head: Normocephalic and atraumatic.  Eyes:     Conjunctiva/sclera: Conjunctivae normal.  Cardiovascular:     Rate and Rhythm: Normal rate and regular rhythm.     Pulses: Intact distal pulses.     Heart sounds: Normal heart sounds.  Pulmonary:     Effort: Pulmonary effort is normal.     Breath sounds: Normal breath sounds. No wheezing.  Abdominal:     General: Bowel sounds are normal.  Palpations: Abdomen is soft.     Tenderness: There is abdominal tenderness in the periumbilical area. There is guarding. There is no rebound. Negative signs include McBurney's sign.  Musculoskeletal:        General: Normal range of motion.     Cervical back: Normal range of motion.  Skin:    General: Skin is warm and dry.  Neurological:     Mental Status: She is alert.  Psychiatric:        Mood and Affect: Mood and affect normal.     ED Results / Procedures / Treatments   Labs (all labs ordered are listed, but only abnormal results are displayed) Labs Reviewed  COMPREHENSIVE METABOLIC PANEL - Abnormal; Notable for the following components:      Result Value   Glucose, Bld 124 (*)    Creatinine, Ser 1.03 (*)    Total Protein 8.5 (*)    Alkaline Phosphatase 148 (*)    All other components within normal limits  URINALYSIS, ROUTINE W REFLEX MICROSCOPIC - Abnormal; Notable for the following components:   Hgb urine dipstick SMALL (*)    Ketones, ur 20 (*)    Protein, ur 100 (*)    Bacteria, UA RARE (*)    All other components within normal limits  CBC WITH DIFFERENTIAL/PLATELET - Abnormal; Notable for the following components:   Hemoglobin 15.5 (*)    HCT 48.1 (*)    RDW 17.3 (*)    All other components within normal limits  GASTROINTESTINAL PANEL BY PCR, STOOL (REPLACES STOOL CULTURE)  C DIFFICILE QUICK SCREEN W PCR REFLEX  LIPASE,  BLOOD    EKG None  Radiology CT ABDOMEN PELVIS W CONTRAST  Result Date: 07/14/2020 CLINICAL DATA:  Abdominal pain, history of colitis. EXAM: CT ABDOMEN AND PELVIS WITH CONTRAST TECHNIQUE: Multidetector CT imaging of the abdomen and pelvis was performed using the standard protocol following bolus administration of intravenous contrast. CONTRAST:  147m OMNIPAQUE IOHEXOL 300 MG/ML  SOLN COMPARISON:  CT abdomen pelvis December 28, 2019. FINDINGS: Lower chest: No acute abnormality. Normal size heart. No pericardial effusion. Small hiatal hernia. Hepatobiliary: Diffuse hepatic steatosis. No suspicious liver lesion. Cholecystectomy. Similar mild reservoir effect of the biliary tree. Pancreas: Unremarkable Spleen: Unremarkable Adrenals/Urinary Tract: Adrenal glands are unremarkable. Interval decrease in size in the multi septated exophytic left upper pole cystic renal lesion which now measures 4 cm previously 5.4 cm in maximum axial dimension on image 16/2. Other smaller scattered tiny hypodense renal lesions, which are technically too small to accurately characterize but favor renal cysts. Urinary bladder is unremarkable for degree of distension. Stomach/Bowel: Stomach is unremarkable. No suspicious small bowel wall thickening or dilation. There is long segment thickening of the colon extending from the rectum through the ascending colon. The appendix is gas and fluid filled with mild distension measuring 8 mm, this is slowly increased in size over multiple priors. Vascular/Lymphatic: No significant vascular findings are present. No enlarged abdominal or pelvic lymph nodes. Reproductive: Status post hysterectomy. No adnexal masses. Other: No abdominal ascites or pneumoperitoneum. Sequela of subcutaneous injections versus trauma. Musculoskeletal: No acute or significant osseous findings. IMPRESSION: 1. Long segment mild colonic wall thickening extending from the rectum through the ascending colon, most consistent  with colitis, which may be infectious or inflammatory in etiology. 2. The appendix is distended with gas and fluid without adjacent inflammatory stranding, but with slowly increase and distension over multiple prior studies. Findings are which are nonspecific and likely reflect a chronic process  such as a possible appendiceal mucocele, consider nonemergent surgical consult. 3. Interval decrease in size of the multi septated exophytic left upper pole cystic renal lesion, which does not definitively denote this as a benign cystic lesion. Recommend more definitive characterization with nonemergent renal protocol MRI with and without contrast. Electronically Signed   By: Dahlia Bailiff MD   On: 07/14/2020 14:52    Procedures Procedures   Medications Ordered in ED Medications  ondansetron [facility]) injection 4 mg (4 mg Intravenous Given 07/14/20 1206)  fentaNYL (SUBLIMAZE) injection 50 mcg (50 mcg Intravenous Given 07/14/20 1206)  promethazine (PHENERGAN) injection 12.5 mg (12.5 mg Intravenous Given 07/14/20 1344)  morphine 4 MG/ML injection 4 mg (4 mg Intravenous Given 07/14/20 1344)  iohexol (OMNIPAQUE) 300 MG/ML solution 100 mL (100 mLs Intravenous Contrast Given 07/14/20 1356)  promethazine (PHENERGAN) injection 12.5 mg (12.5 mg Intravenous Given 07/14/20 1820)  morphine 2 MG/ML injection 2 mg (2 mg Intravenous Given 07/14/20 1820)  metoprolol tartrate (LOPRESSOR) injection 5 mg (5 mg Intravenous Given 07/14/20 1818)    ED Course  I have reviewed the triage vital signs and the nursing notes.  Pertinent labs & imaging results that were available during my care of the patient were reviewed by me and considered in my medical decision making (see chart for details).    MDM Rules/Calculators/A&P                         Patient was given IV fluids.  Also given Zofran and fentanyl, she did not have relief of her nausea with the Zofran, fentanyl was briefly helpful for her pain, switched to morphine and Phenergan  which gave her much better relief of her symptoms.  Labs reviewed and are reassuring.  CT imaging was obtained given history of colitis and significant guarding upon initial presentation.  Findings were discussed with Dr. Constance Haw of general surgery regarding the appendiceal findings.  She agrees that patient may need an nonemergent surgical evaluation for the appendix issues but there is no evidence for acute appendicitis with today's findings.  She will need GI evaluation as primary next depth.  Discussed case with Dr. Gala Romney who has asked for stool studies include C. difficile collection prior to discharge home, advised no NSAIDs, clear liquid diet with slow progression as tolerated.  He will plan office follow-up with this patient for early next week.  Patient's blood pressure has increased while here, she also had borderline tachycardia at reexam, she did not tolerate her p.o. Toprol this morning, this was given per IV to better control her rate and blood pressure.  P.o. challenge performed here and pt tolerated.  She was not able to give stool sample - given stool kit to drop off at our lab tomorrow.  Plan will be for discharge home with close follow-up per above.  Final Clinical Impression(s) / ED Diagnoses Final diagnoses:  Colitis  Renal cyst    Rx / DC Orders ED Discharge Orders         Ordered    promethazine (PHENERGAN) 25 MG tablet  Every 6 hours PRN        07/14/20 1905           [name], Hershal Coria 07/14/20 1909    [name], PA-C 07/14/20 1918    Hayden Rasmussen, MD 07/14/20 2046

## 2020-07-14 NOTE — ED Notes (Signed)
Pt to CT

## 2020-07-14 NOTE — ED Notes (Signed)
PA made aware that pt has not had diarrhea since this am. Pt states she does not feel like she needs to have any diarrhea anymore. States she mainly feels nauseas. PA is aware. Will obtain GI panel and cdiff samples when/ if pt is able to provide stool sample.

## 2020-07-14 NOTE — Discharge Instructions (Addendum)
Maintain a clear liquid diet as discussed for the next 1 to 2 days after which you can slowly increase to a regular diet if your symptoms allow.  Use the Phenergan as needed for nausea relief.  Avoid taking any NSAIDs which include ibuprofen, Motrin, Aleve as these medications can worsen your abdominal symptoms.  Call Dr. Roseanne Kaufman office as discussed to arrange close follow-up next week.  Your CT scan also shows that you have a cyst in your left kidney.  This may be a chronic finding, however it is recommended that you have an MRI test done as an outpatient to further evaluate this to determine that it is a simple benign cyst.  Call your doctor to arrange this study for you.  In the meantime, return here if you have any worsened symptoms, abdominal pain, you develop fevers or your uncontrolled vomiting returns.

## 2020-07-18 ENCOUNTER — Other Ambulatory Visit: Payer: Self-pay | Admitting: Emergency Medicine

## 2020-07-18 ENCOUNTER — Other Ambulatory Visit (HOSPITAL_COMMUNITY): Payer: Self-pay | Admitting: Emergency Medicine

## 2020-07-18 DIAGNOSIS — N281 Cyst of kidney, acquired: Secondary | ICD-10-CM

## 2020-08-03 ENCOUNTER — Other Ambulatory Visit: Payer: Self-pay

## 2020-08-03 ENCOUNTER — Encounter (INDEPENDENT_AMBULATORY_CARE_PROVIDER_SITE_OTHER): Payer: Self-pay | Admitting: *Deleted

## 2020-08-03 ENCOUNTER — Ambulatory Visit (HOSPITAL_COMMUNITY)
Admission: RE | Admit: 2020-08-03 | Discharge: 2020-08-03 | Disposition: A | Discharge status: Home / Self Care | Payer: BC Managed Care – PPO | Source: Ambulatory Visit | Attending: Emergency Medicine | Admitting: Emergency Medicine

## 2020-08-03 DIAGNOSIS — N281 Cyst of kidney, acquired: Secondary | ICD-10-CM | POA: Diagnosis not present

## 2020-08-03 IMAGING — MR MR ABDOMEN WO/W CM
20 series · 48 of 48 positions shown · IV contrast (gadavist)
Comparison: CT of [DATE]

CLINICAL DATA: Recent CT demonstrating an upper pole left renal
lesion. Colitis. Uterine cancer. Cholecystectomy.

EXAM:
MRI ABDOMEN WITHOUT AND WITH CONTRAST
TECHNIQUE: Multiplanar multisequence MR imaging of the abdomen was performed
both before and after the administration of intravenous contrast.
CONTRAST:  5mL GADAVIST GADOBUTROL 1 MMOL/ML IV SOLN

[Series 3: cor haste · coronal · 6.0mm · 1.19mm/px · 2 of 30 slices shown]
[im 1/30]
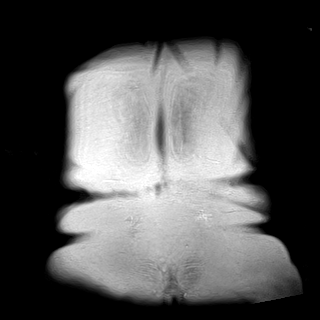
[im 30/30]
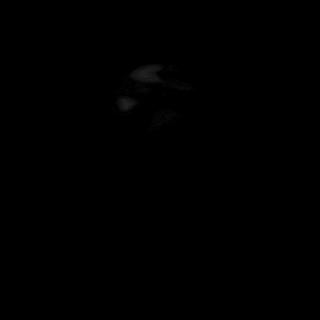

[Series 4: ax haste · axial · 6.0mm · 1.19mm/px · z∈[-66,+143]mm · 2 of 30 slices shown]
[im 1/30]
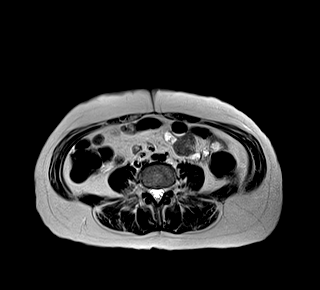
[im 30/30]
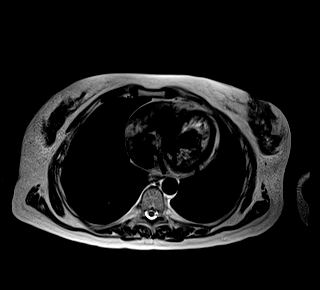

[Series 5: T2 fat-sat · axial · 6.0mm · 1.19mm/px · z∈[-71,+138]mm · 2 of 30 slices shown]
[im 1/30]
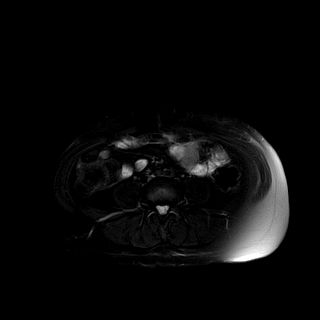
[im 30/30]
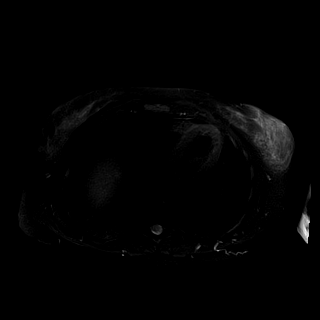

[Series 6: ax in and · axial · 3.5mm · 1.31mm/px · z∈[-128,+120]mm · 4 of 72 slices shown (1 of 2)]
[im 1/72]
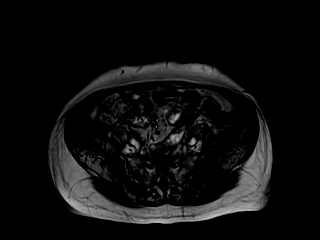
[im 24/72]
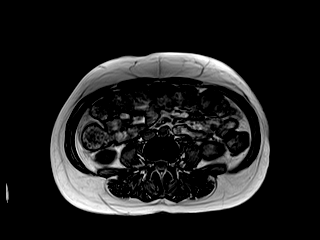
[im 48/72]
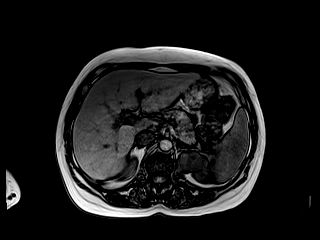
[im 72/72]
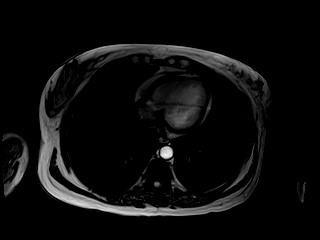

[Series 7: ax in and · axial · 3.5mm · 1.31mm/px · z∈[-128,+120]mm · 3 of 72 slices shown (2 of 2)]
[im 1/72]
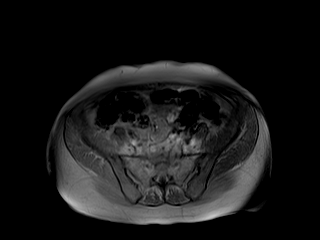
[im 36/72]
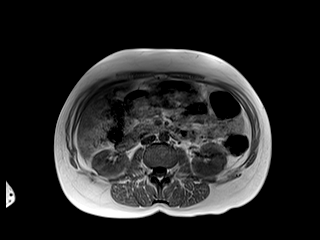
[im 72/72]
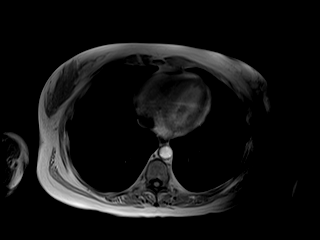

[Series 8: DWI · axial · 6.0mm · 1.42mm/px · 1 of 30 slices shown (1 of 4)]
[im 1/30]
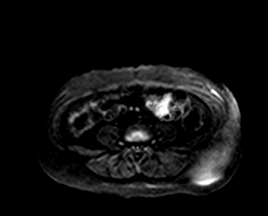

[Series 8: DWI · axial · 6.0mm · 1.42mm/px · 1 of 30 slices shown (2 of 4)]
[im 1/30]
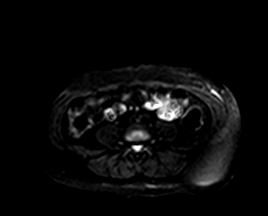

[Series 8: DWI · axial · 6.0mm · 1.42mm/px · 1 of 30 slices shown (3 of 4)]
[im 1/30]
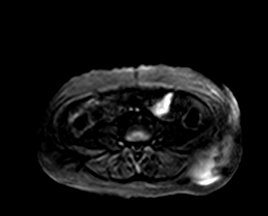

[Series 9: DWI · axial · 6.0mm · 1.42mm/px · 1 of 30 slices shown (4 of 4)]
[im 1/30]
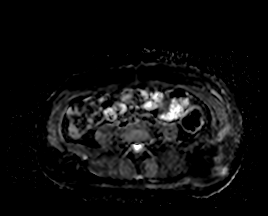

[Series 10: bSSFP · axial · 6.0mm · 0.74mm/px · 1 of 30 slices shown]
[im 1/30]
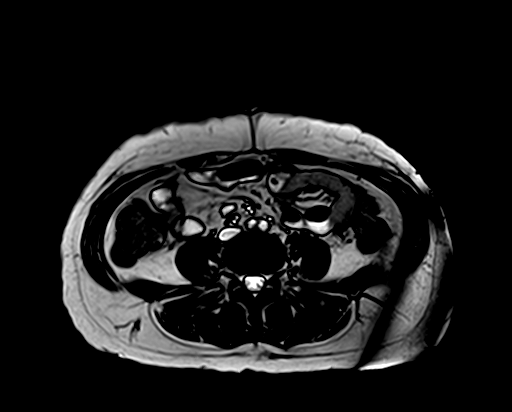

[Series 11: t1_vibe_fs_tra_p4_bh_pre · axial · 3.0mm · 1.34mm/px · z∈[-96,+117]mm · 3 of 72 slices shown]
[im 1/72]
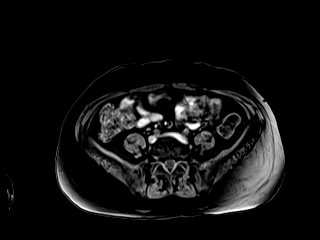
[im 36/72]
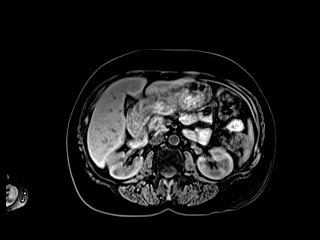
[im 72/72]
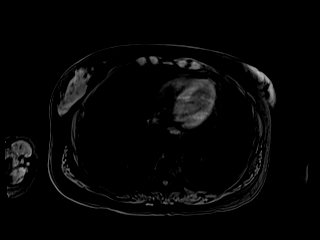

[Series 13: t1_vibe_fs_tra_p4_bh_post · axial · 3.0mm · 1.34mm/px · z∈[-96,+117]mm · 3 of 72 slices shown (1 of 4)]
[im 1/72]
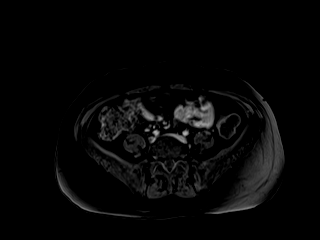
[im 36/72]
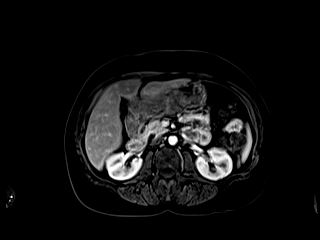
[im 72/72]
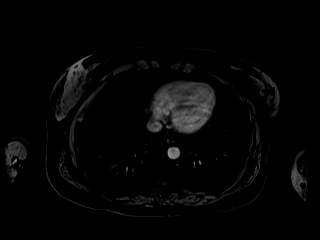

[Series 14: t1_vibe_fs_tra_p4_bh_post_sub · axial · 3.0mm · 1.34mm/px · z∈[-96,+117]mm · 3 of 72 slices shown (1 of 4)]
[im 1/72]
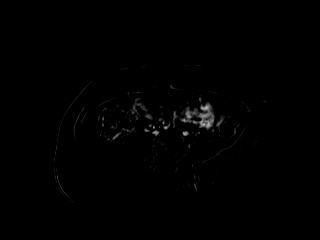
[im 36/72]
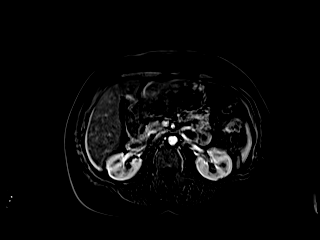
[im 72/72]
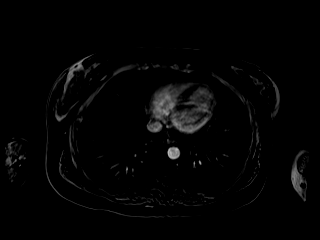

[Series 15: t1_vibe_fs_tra_p4_bh_post · axial · 3.0mm · 1.34mm/px · z∈[-96,+117]mm · 3 of 72 slices shown (2 of 4)]
[im 1/72]
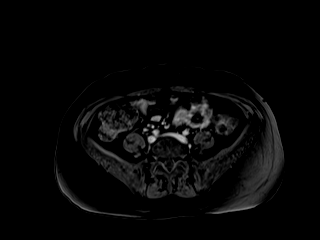
[im 36/72]
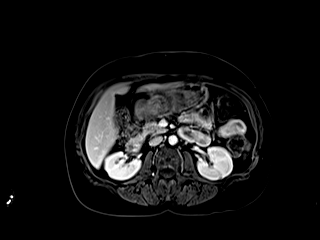
[im 72/72]
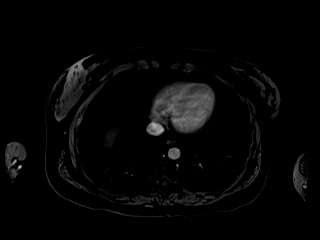

[Series 16: t1_vibe_fs_tra_p4_bh_post_sub · axial · 3.0mm · 1.34mm/px · z∈[-96,+117]mm · 3 of 72 slices shown (2 of 4)]
[im 1/72]
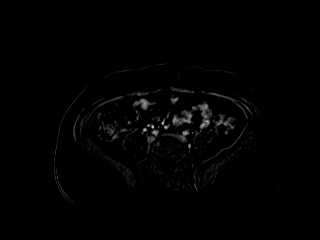
[im 36/72]
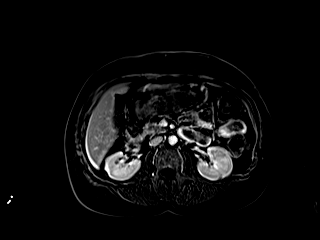
[im 72/72]
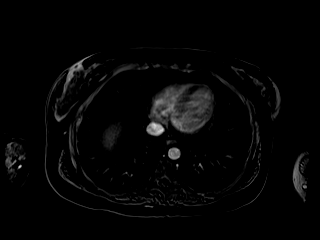

[Series 17: t1_vibe_fs_tra_p4_bh_post · axial · 3.0mm · 1.34mm/px · z∈[-96,+117]mm · 3 of 72 slices shown (3 of 4)]
[im 1/72]
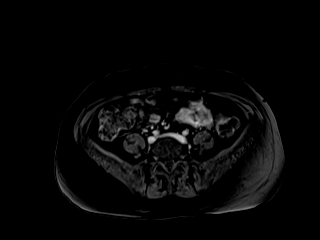
[im 36/72]
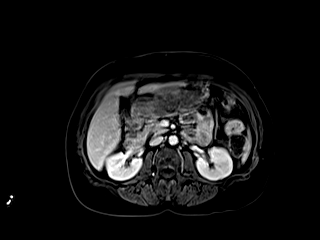
[im 72/72]
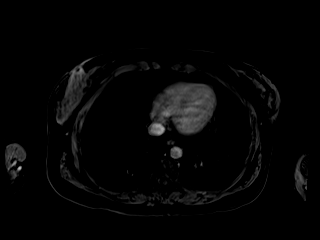

[Series 18: t1_vibe_fs_tra_p4_bh_post_sub · axial · 3.0mm · 1.34mm/px · z∈[-96,+117]mm · 3 of 72 slices shown (3 of 4)]
[im 1/72]
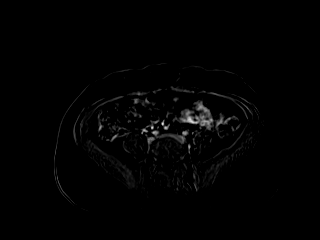
[im 36/72]
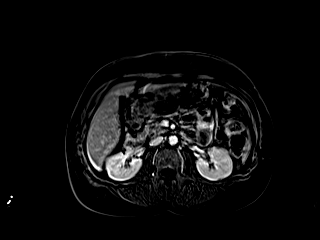
[im 72/72]
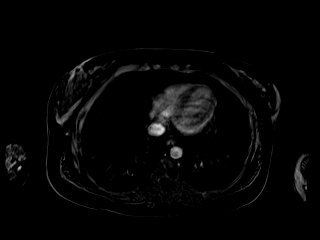

[Series 19: t1_vibe_fs_tra_p4_bh_post · axial · 3.0mm · 1.34mm/px · z∈[-96,+117]mm · 3 of 72 slices shown (4 of 4)]
[im 1/72]
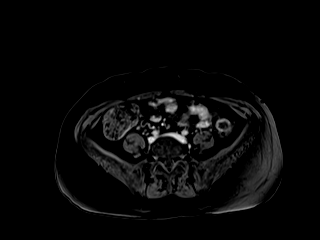
[im 36/72]
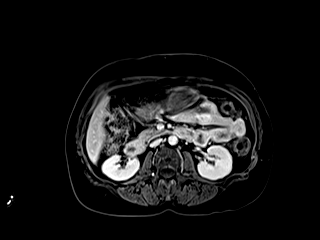
[im 72/72]
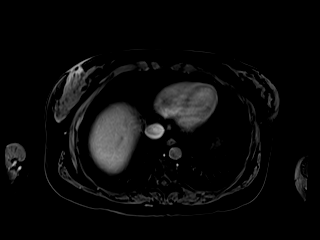

[Series 20: t1_vibe_fs_tra_p4_bh_post_sub · axial · 3.0mm · 1.34mm/px · z∈[-96,+117]mm · 3 of 72 slices shown (4 of 4)]
[im 1/72]
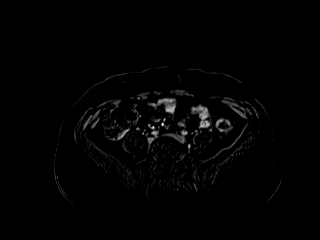
[im 36/72]
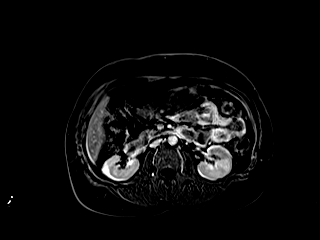
[im 72/72]
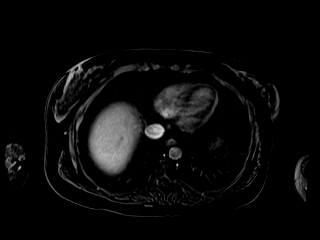

[Series 21: T1 dynamic post-contrast · coronal · 3.0mm · 1.31mm/px · 3 of 72 slices shown]
[im 1/72]
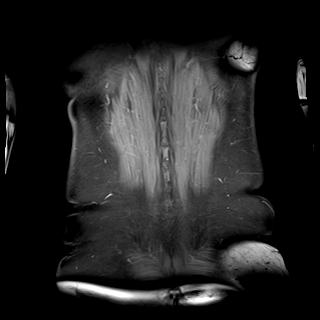
[im 36/72]
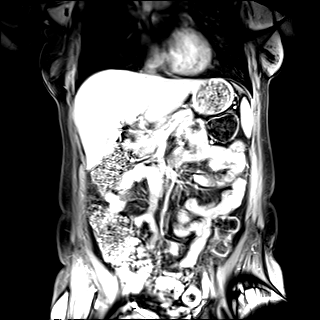
[im 72/72]
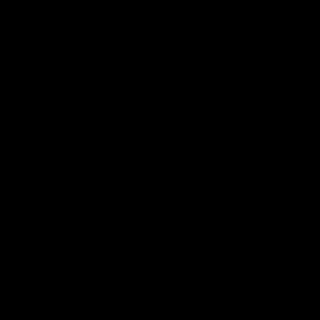

[48 of 48 positions shown; findings below may reference images not displayed]

FINDINGS: Lower chest: Normal heart size without pericardial or pleural
effusion.

Hepatobiliary: Tiny right hepatic lobe cyst. Cholecystectomy,
without biliary ductal dilatation.

Pancreas:  Normal, without mass or ductal dilatation.

Spleen:  Normal in size, without focal abnormality.

Adrenals/Urinary Tract: Normal adrenal glands. Normal right kidney.
Small left renal cysts.

Corresponding the CT abnormality, within the upper pole left kidney
is a 3.9 x 3.1 x 3.0 cm lesion on [DATE] and [DATE]. Demonstrates
enhancement within its peripheral wall as well as 2 thin septa.
Present as far back as [NX], where it measured on the order of 2.2 x
2.1 cm.

No hydronephrosis.

Stomach/Bowel: Normal stomach and small bowel. Previously described
left-sided colitis has resolved.

Vascular/Lymphatic: Aortic atherosclerosis. Patent renal veins. No
retroperitoneal or retrocrural adenopathy.

Other:  No ascites.

Musculoskeletal: No acute osseous abnormality.
IMPRESSION: 1. Multi septated cystic lesion off the upper pole left kidney. This
is considered Bosniak 2F. Of note, it has been present back to [NX]
with mild interval enlargement over the past 7+ years. Consider
surveillance with pre and post contrast abdominal MRI at 6-12
months.
2.  No acute abdominal process.
3.  Aortic Atherosclerosis ([NX]-[NX]).

## 2020-08-03 MED ORDER — GADOBUTROL 1 MMOL/ML IV SOLN
5.0000 mL | Freq: Once | INTRAVENOUS | Status: AC | PRN
  Administered 2020-08-03: 5 mL via INTRAVENOUS

## 2020-08-04 ENCOUNTER — Encounter (INDEPENDENT_AMBULATORY_CARE_PROVIDER_SITE_OTHER): Payer: Self-pay | Admitting: Gastroenterology

## 2020-08-04 ENCOUNTER — Ambulatory Visit (INDEPENDENT_AMBULATORY_CARE_PROVIDER_SITE_OTHER): Payer: BC Managed Care – PPO | Admitting: Gastroenterology

## 2020-08-04 ENCOUNTER — Telehealth (INDEPENDENT_AMBULATORY_CARE_PROVIDER_SITE_OTHER): Payer: Self-pay

## 2020-08-04 ENCOUNTER — Encounter (INDEPENDENT_AMBULATORY_CARE_PROVIDER_SITE_OTHER): Payer: Self-pay | Admitting: *Deleted

## 2020-08-04 NOTE — Telephone Encounter (Signed)
Patient no showed with Dr. Daniel Castaneda on 08/04/2020 at [facility].  

## 2020-08-09 ENCOUNTER — Other Ambulatory Visit: Payer: Self-pay

## 2020-08-09 ENCOUNTER — Ambulatory Visit: Payer: BC Managed Care – PPO | Admitting: General Surgery

## 2020-08-09 ENCOUNTER — Encounter: Payer: Self-pay | Admitting: General Surgery

## 2020-08-09 VITALS — BP 149/78 | HR 79 | Temp 97.4°F | Resp 12 | Ht 61.0 in | Wt 116.0 lb

## 2020-08-09 DIAGNOSIS — R1031 Right lower quadrant pain: Secondary | ICD-10-CM | POA: Diagnosis not present

## 2020-08-09 MED ORDER — ONDANSETRON HCL 4 MG PO TABS: 4.0000 mg | ORAL_TABLET | Freq: Three times a day (TID) | ORAL | 1 refills | Status: DC | PRN

## 2020-08-10 NOTE — Progress Notes (Signed)
[name]; [id number]; [birth year]   HPI Patient is a 50 year old white female who was referred to my care by Dr. Bartolo Darter for evaluation and treatment of an abnormal CT scan of the abdomen as well as lower abdominal pain.  Patient has had intermittent abdominal pain due to a colitis for many years, but recently it has noted some increased right lower quadrant abdominal pain.  She has been seen in the emergency room and by GI multiple times in the past.  She recently underwent a CT scan of the abdomen which revealed an enlarging appendix.  She was also noted to have left-sided colitis.  A recent MRI was performed of the abdomen which revealed the colitis to be resolved.  She is continuing to have right lower quadrant abdominal pain and nausea.  She denies any fever or chills. Past Medical History:  Diagnosis Date  . Chronic back pain   . Colitis   . Hypertension   . Migraine   . Plumbism    blood clot     Past Surgical History:  Procedure Laterality Date  . ABDOMINAL HYSTERECTOMY    . BACK SURGERY    . CHOLECYSTECTOMY    . HAND SURGERY      Family History  Problem Relation Age of Onset  . Cancer Other   . Seizures Other   . Stroke Other   . Diabetes Other     Current Outpatient Medications on File Prior to Visit  Medication Sig Dispense Refill  . clonazePAM (KLONOPIN) 0.5 MG tablet Take 0.5 mg by mouth daily as needed for anxiety.     . fluticasone (FLONASE) 50 MCG/ACT nasal spray SMARTSIG:1 Both Nares Every Night    . hydrOXYzine (ATARAX/VISTARIL) 25 MG tablet Take 25 mg by mouth 3 (three) times daily as needed for anxiety.     . metoprolol succinate (TOPROL-XL) 100 MG 24 hr tablet Take 100 mg by mouth in the morning. Take with or immediately following a meal.    . oxyCODONE-acetaminophen (PERCOCET) 5-325 MG tablet Take 1-2 tablets by mouth every 8 (eight) hours as needed for severe pain. 75 tablet 0  . temazepam (RESTORIL) 30 MG capsule Take 30 mg by mouth at bedtime as needed  for sleep.     . promethazine (PHENERGAN) 25 MG tablet Take 1 tablet (25 mg total) by mouth every 6 (six) hours as needed for nausea or vomiting. (Patient not taking: Reported on 08/09/2020) 30 tablet 0   No current facility-administered medications on file prior to visit.    Allergies  Allergen Reactions  . Buprenorphine Other (See Comments)    Chest pain/ringing in hears and feet swelling  . Cefaclor   . Gabapentin Nausea Only    Vomiting  And dizzy  . Ibuprofen Swelling and Rash  . Levofloxacin Rash    Itching  . Macrobid [Nitrofurantoin Monohyd Macro] Rash  . Naproxen Swelling and Rash  . Penicillins Swelling and Rash         Social History   Substance and Sexual Activity  Alcohol Use No    Social History   Tobacco Use  Smoking Status Current Every Day Smoker  . Packs/day: 1.00  . Years: 20.00  . Pack years: 20.00  . Types: Cigarettes  Smokeless Tobacco Never Used    Review of Systems  Constitutional: Positive for malaise/fatigue.  HENT: Negative.   Eyes: Negative.   Respiratory: Negative.   Cardiovascular: Negative.   Gastrointestinal: Positive for abdominal pain, heartburn and nausea.  Musculoskeletal: Positive for back pain.  Skin: Negative.   Neurological: Negative.   Endo/Heme/Allergies: Negative.   Psychiatric/Behavioral: Negative.     Objective   Vitals:   08/09/20 1336  BP: (!) 149/78  Pulse: 79  Resp: 12  Temp: (!) 97.4 F (36.3 C)  SpO2: 99%    Physical Exam Vitals reviewed.  Constitutional:      Appearance: Normal appearance. She is normal weight. She is not ill-appearing.  HENT:     Head: Normocephalic and atraumatic.  Cardiovascular:     Rate and Rhythm: Normal rate and regular rhythm.     Heart sounds: Normal heart sounds. No murmur heard. No friction rub. No gallop.   Pulmonary:     Effort: Pulmonary effort is normal. No respiratory distress.     Breath sounds: Normal breath sounds. No stridor. No wheezing, rhonchi or  rales.  Abdominal:     General: Abdomen is flat. Bowel sounds are normal. There is no distension.     Palpations: Abdomen is soft. There is no mass.     Tenderness: There is abdominal tenderness. There is no guarding or rebound.     Hernia: No hernia is present.     Comments: Migratory discomfort to palpation throughout the abdomen, but specifically in the right lower quadrant.  No rigidity is noted.  Skin:    General: Skin is warm and dry.  Neurological:     Mental Status: She is alert and oriented to person, place, and time.   CT scan images personally reviewed  Assessment  Chronic abdominal pain, enlarging appendix with possible mucocele development Plan   Patient is scheduled for laparoscopic appendectomy on 08/22/2020.  The risks and benefits of the procedure including bleeding, infection, and the possibility of an open procedure were fully explained to the patient, who gave informed consent.  Given her multiple GI visits in the past, no need for colonoscopy at present time.

## 2020-08-10 NOTE — H&P (Signed)
[name]; [id number]; [birth year]   HPI Patient is a 50 year old white female who was referred to my care by Dr. Bartolo Darter for evaluation and treatment of an abnormal CT scan of the abdomen as well as lower abdominal pain.  Patient has had intermittent abdominal pain due to a colitis for many years, but recently it has noted some increased right lower quadrant abdominal pain.  She has been seen in the emergency room and by GI multiple times in the past.  She recently underwent a CT scan of the abdomen which revealed an enlarging appendix.  She was also noted to have left-sided colitis.  A recent MRI was performed of the abdomen which revealed the colitis to be resolved.  She is continuing to have right lower quadrant abdominal pain and nausea.  She denies any fever or chills. Past Medical History:  Diagnosis Date  . Chronic back pain   . Colitis   . Hypertension   . Migraine   . Plumbism    blood clot     Past Surgical History:  Procedure Laterality Date  . ABDOMINAL HYSTERECTOMY    . BACK SURGERY    . CHOLECYSTECTOMY    . HAND SURGERY      Family History  Problem Relation Age of Onset  . Cancer Other   . Seizures Other   . Stroke Other   . Diabetes Other     Current Outpatient Medications on File Prior to Visit  Medication Sig Dispense Refill  . clonazePAM (KLONOPIN) 0.5 MG tablet Take 0.5 mg by mouth daily as needed for anxiety.     . fluticasone (FLONASE) 50 MCG/ACT nasal spray SMARTSIG:1 Both Nares Every Night    . hydrOXYzine (ATARAX/VISTARIL) 25 MG tablet Take 25 mg by mouth 3 (three) times daily as needed for anxiety.     . metoprolol succinate (TOPROL-XL) 100 MG 24 hr tablet Take 100 mg by mouth in the morning. Take with or immediately following a meal.    . oxyCODONE-acetaminophen (PERCOCET) 5-325 MG tablet Take 1-2 tablets by mouth every 8 (eight) hours as needed for severe pain. 75 tablet 0  . temazepam (RESTORIL) 30 MG capsule Take 30 mg by mouth at bedtime as needed  for sleep.     . promethazine (PHENERGAN) 25 MG tablet Take 1 tablet (25 mg total) by mouth every 6 (six) hours as needed for nausea or vomiting. (Patient not taking: Reported on 08/09/2020) 30 tablet 0   No current facility-administered medications on file prior to visit.    Allergies  Allergen Reactions  . Buprenorphine Other (See Comments)    Chest pain/ringing in hears and feet swelling  . Cefaclor   . Gabapentin Nausea Only    Vomiting  And dizzy  . Ibuprofen Swelling and Rash  . Levofloxacin Rash    Itching  . Macrobid [Nitrofurantoin Monohyd Macro] Rash  . Naproxen Swelling and Rash  . Penicillins Swelling and Rash         Social History   Substance and Sexual Activity  Alcohol Use No    Social History   Tobacco Use  Smoking Status Current Every Day Smoker  . Packs/day: 1.00  . Years: 20.00  . Pack years: 20.00  . Types: Cigarettes  Smokeless Tobacco Never Used    Review of Systems  Constitutional: Positive for malaise/fatigue.  HENT: Negative.   Eyes: Negative.   Respiratory: Negative.   Cardiovascular: Negative.   Gastrointestinal: Positive for abdominal pain, heartburn and nausea.  Musculoskeletal: Positive for back pain.  Skin: Negative.   Neurological: Negative.   Endo/Heme/Allergies: Negative.   Psychiatric/Behavioral: Negative.     Objective   Vitals:   08/09/20 1336  BP: (!) 149/78  Pulse: 79  Resp: 12  Temp: (!) 97.4 F (36.3 C)  SpO2: 99%    Physical Exam Vitals reviewed.  Constitutional:      Appearance: Normal appearance. She is normal weight. She is not ill-appearing.  HENT:     Head: Normocephalic and atraumatic.  Cardiovascular:     Rate and Rhythm: Normal rate and regular rhythm.     Heart sounds: Normal heart sounds. No murmur heard. No friction rub. No gallop.   Pulmonary:     Effort: Pulmonary effort is normal. No respiratory distress.     Breath sounds: Normal breath sounds. No stridor. No wheezing, rhonchi or  rales.  Abdominal:     General: Abdomen is flat. Bowel sounds are normal. There is no distension.     Palpations: Abdomen is soft. There is no mass.     Tenderness: There is abdominal tenderness. There is no guarding or rebound.     Hernia: No hernia is present.     Comments: Migratory discomfort to palpation throughout the abdomen, but specifically in the right lower quadrant.  No rigidity is noted.  Skin:    General: Skin is warm and dry.  Neurological:     Mental Status: She is alert and oriented to person, place, and time.   CT scan images personally reviewed  Assessment  Chronic abdominal pain, enlarging appendix with possible mucocele development Plan   Patient is scheduled for laparoscopic appendectomy on 08/22/2020.  The risks and benefits of the procedure including bleeding, infection, and the possibility of an open procedure were fully explained to the patient, who gave informed consent.  Given her multiple GI visits in the past, no need for colonoscopy at present time.

## 2020-08-18 ENCOUNTER — Observation Stay (HOSPITAL_COMMUNITY)
Admission: EM | Admit: 2020-08-18 | Discharge: 2020-08-18 | Discharge status: Against Medical Advice | Payer: BC Managed Care – PPO | Attending: Family Medicine | Admitting: Family Medicine

## 2020-08-18 ENCOUNTER — Other Ambulatory Visit: Payer: Self-pay

## 2020-08-18 ENCOUNTER — Encounter (HOSPITAL_COMMUNITY): Payer: Self-pay | Admitting: Emergency Medicine

## 2020-08-18 ENCOUNTER — Emergency Department (HOSPITAL_COMMUNITY): Payer: BC Managed Care – PPO

## 2020-08-18 DIAGNOSIS — I10 Essential (primary) hypertension: Secondary | ICD-10-CM | POA: Insufficient documentation

## 2020-08-18 DIAGNOSIS — Z90710 Acquired absence of both cervix and uterus: Secondary | ICD-10-CM

## 2020-08-18 DIAGNOSIS — K5090 Crohn's disease, unspecified, without complications: Principal | ICD-10-CM | POA: Diagnosis present

## 2020-08-18 DIAGNOSIS — Z823 Family history of stroke: Secondary | ICD-10-CM

## 2020-08-18 DIAGNOSIS — R112 Nausea with vomiting, unspecified: Secondary | ICD-10-CM | POA: Diagnosis not present

## 2020-08-18 DIAGNOSIS — Z79899 Other long term (current) drug therapy: Secondary | ICD-10-CM | POA: Diagnosis not present

## 2020-08-18 DIAGNOSIS — Z888 Allergy status to other drugs, medicaments and biological substances status: Secondary | ICD-10-CM | POA: Diagnosis not present

## 2020-08-18 DIAGNOSIS — F17210 Nicotine dependence, cigarettes, uncomplicated: Secondary | ICD-10-CM | POA: Diagnosis not present

## 2020-08-18 DIAGNOSIS — Z886 Allergy status to analgesic agent status: Secondary | ICD-10-CM

## 2020-08-18 DIAGNOSIS — R1031 Right lower quadrant pain: Principal | ICD-10-CM | POA: Insufficient documentation

## 2020-08-18 DIAGNOSIS — Z9049 Acquired absence of other specified parts of digestive tract: Secondary | ICD-10-CM

## 2020-08-18 DIAGNOSIS — Z833 Family history of diabetes mellitus: Secondary | ICD-10-CM | POA: Diagnosis not present

## 2020-08-18 DIAGNOSIS — Z880 Allergy status to penicillin: Secondary | ICD-10-CM

## 2020-08-18 DIAGNOSIS — G894 Chronic pain syndrome: Secondary | ICD-10-CM | POA: Diagnosis present

## 2020-08-18 DIAGNOSIS — D75839 Thrombocytosis, unspecified: Secondary | ICD-10-CM | POA: Diagnosis not present

## 2020-08-18 DIAGNOSIS — R197 Diarrhea, unspecified: Secondary | ICD-10-CM | POA: Diagnosis not present

## 2020-08-18 DIAGNOSIS — R109 Unspecified abdominal pain: Secondary | ICD-10-CM | POA: Diagnosis present

## 2020-08-18 DIAGNOSIS — K529 Noninfective gastroenteritis and colitis, unspecified: Secondary | ICD-10-CM | POA: Diagnosis present

## 2020-08-18 LAB — COMPREHENSIVE METABOLIC PANEL
ALT: 22 U/L (ref 0–44)
AST: 22 U/L (ref 15–41)
Albumin: 3.7 g/dL (ref 3.5–5.0)
Alkaline Phosphatase: 116 U/L (ref 38–126)
Anion gap: 12 (ref 5–15)
BUN: 16 mg/dL (ref 6–20)
CO2: 21 mmol/L — ABNORMAL LOW (ref 22–32)
Calcium: 9 mg/dL (ref 8.9–10.3)
Chloride: 105 mmol/L (ref 98–111)
Creatinine, Ser: 1.06 mg/dL — ABNORMAL HIGH (ref 0.44–1.00)
GFR, Estimated: >60 mL/min (ref >60)
Glucose, Bld: 134 mg/dL — ABNORMAL HIGH (ref 70–99)
Potassium: 3.7 mmol/L (ref 3.5–5.1)
Sodium: 138 mmol/L (ref 135–145)
Total Bilirubin: 0.4 mg/dL (ref 0.3–1.2)
Total Protein: 7.5 g/dL (ref 6.5–8.1)

## 2020-08-18 LAB — CBC WITH DIFFERENTIAL/PLATELET
Abs Immature Granulocytes: 0.04 10*3/uL (ref 0.00–0.07)
Basophils Absolute: 0.1 10*3/uL (ref 0.0–0.1)
Basophils Relative: 1 %
Eosinophils Absolute: 0 10*3/uL (ref 0.0–0.5)
Eosinophils Relative: 0 %
HCT: 44.2 % (ref 36.0–46.0)
Hemoglobin: 14.1 g/dL (ref 12.0–15.0)
Immature Granulocytes: 1 %
Lymphocytes Relative: 25 %
Lymphs Abs: 2 10*3/uL (ref 0.7–4.0)
MCH: 31.2 pg (ref 26.0–34.0)
MCHC: 31.9 g/dL (ref 30.0–36.0)
MCV: 97.8 fL (ref 80.0–100.0)
Monocytes Absolute: 0.4 10*3/uL (ref 0.1–1.0)
Monocytes Relative: 4 %
Neutro Abs: 5.4 10*3/uL (ref 1.7–7.7)
Neutrophils Relative %: 69 %
Platelets: 468 10*3/uL — ABNORMAL HIGH (ref 150–400)
RBC: 4.52 MIL/uL (ref 3.87–5.11)
RDW: 15.3 % (ref 11.5–15.5)
WBC: 7.9 10*3/uL (ref 4.0–10.5)
nRBC: 0 % (ref 0.0–0.2)

## 2020-08-18 LAB — URINALYSIS, ROUTINE W REFLEX MICROSCOPIC
Bilirubin Urine: NEGATIVE
Glucose, UA: NEGATIVE mg/dL
Hgb urine dipstick: NEGATIVE
Ketones, ur: NEGATIVE mg/dL
Leukocytes,Ua: NEGATIVE
Nitrite: NEGATIVE
Protein, ur: NEGATIVE mg/dL
Specific Gravity, Urine: <1.005 — ABNORMAL LOW (ref 1.005–1.030)
pH: 5.5 (ref 5.0–8.0)

## 2020-08-18 LAB — HIV ANTIBODY (ROUTINE TESTING W REFLEX): HIV Screen 4th Generation wRfx: NONREACTIVE

## 2020-08-18 LAB — LIPASE, BLOOD: Lipase: 21 U/L (ref 11–51)

## 2020-08-18 LAB — CBG MONITORING, ED: Glucose-Capillary: 76 mg/dL (ref 70–99)

## 2020-08-18 IMAGING — CT CT ABD-PELV W/ CM
2 of 5 series · 14 of 46 positions shown, 16 images · IV contrast (Omnipaque or Isovue)
Comparison: CT abdomen and pelvis [DATE]; abdominal MR IGNER

CLINICAL DATA: Abdominal pain, primarily right cited

EXAM:
CT ABDOMEN AND PELVIS WITH CONTRAST
TECHNIQUE: Multidetector CT imaging of the abdomen and pelvis was performed
using the standard protocol following bolus administration of
intravenous contrast.
CONTRAST:  75mL OMNIPAQUE IOHEXOL 300 MG/ML  SOLN

[Series 2: axial st · axial · 0.73mm/px · z∈[+852,+1248]mm · 11 of 91 slices shown, 13 images]
[im 6/91  soft-tissue]
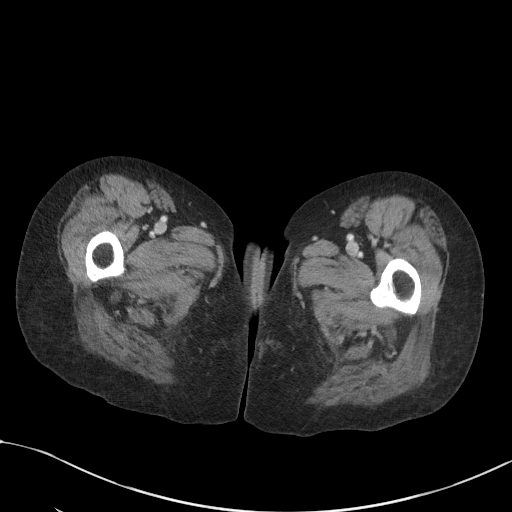
[im 6/91  bone]
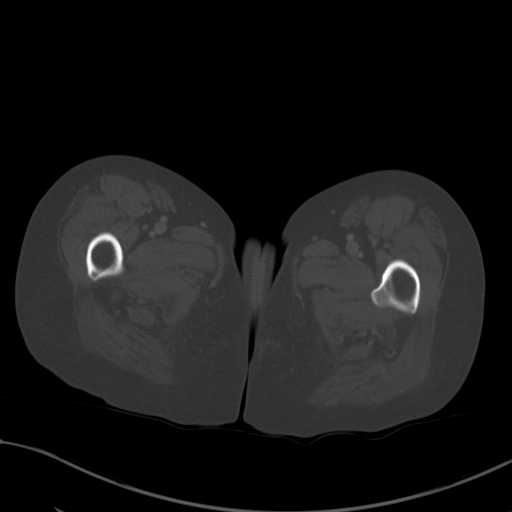
[im 16/91  soft-tissue]
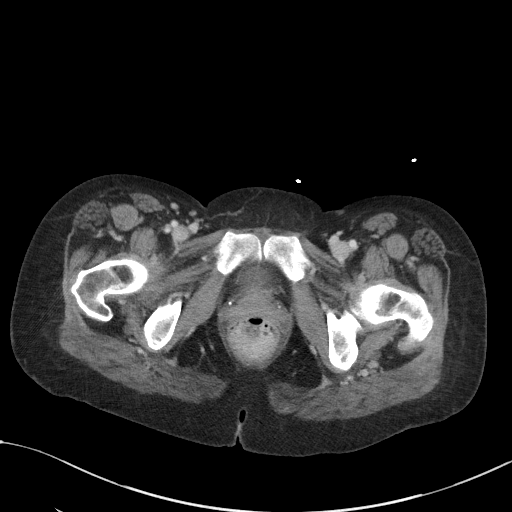
[im 22/91  soft-tissue]
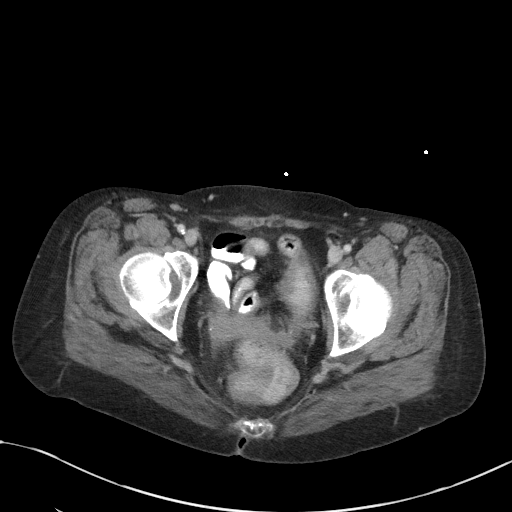
[im 32/91  soft-tissue]
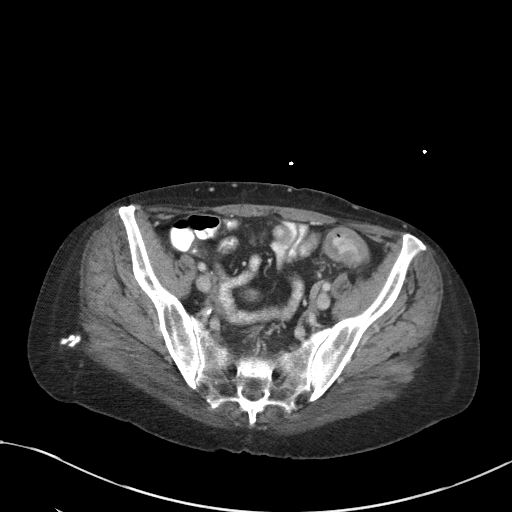
[im 38/91  soft-tissue]
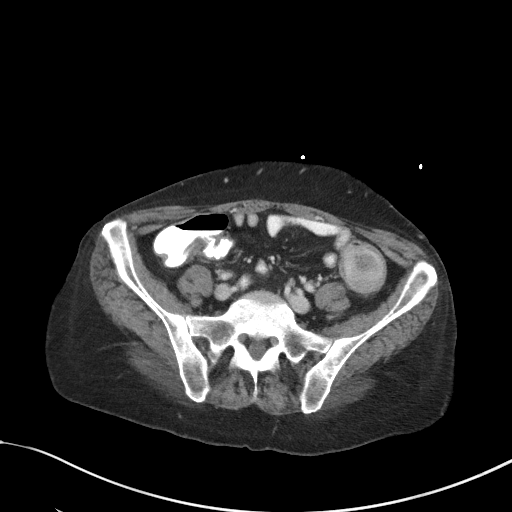
[im 48/91  soft-tissue]
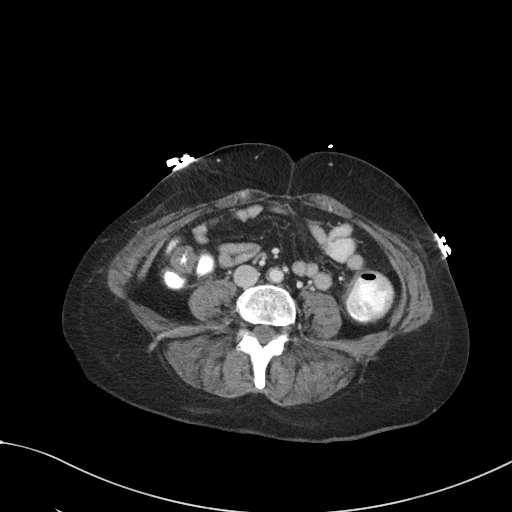
[im 53/91  soft-tissue]
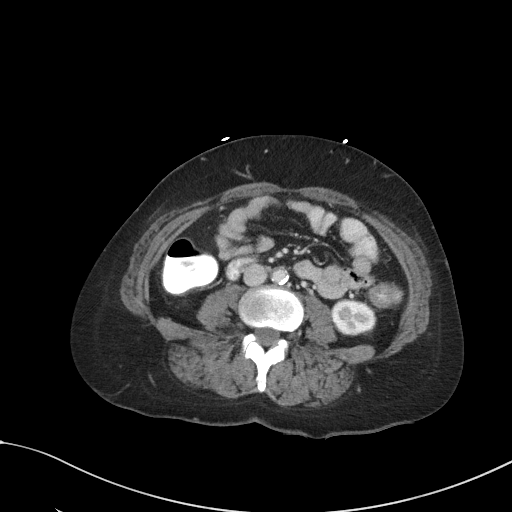
[im 59/91  soft-tissue]
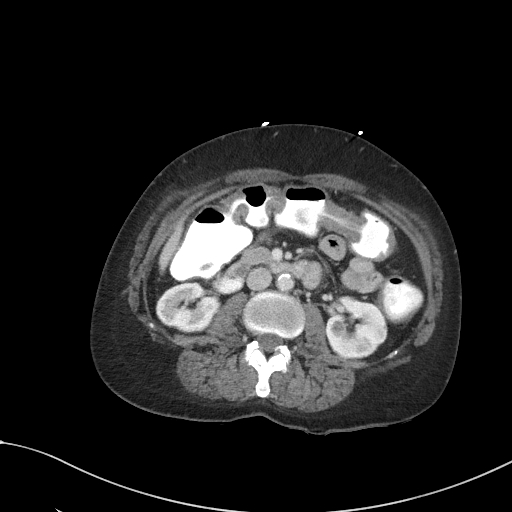
[im 69/91  soft-tissue]
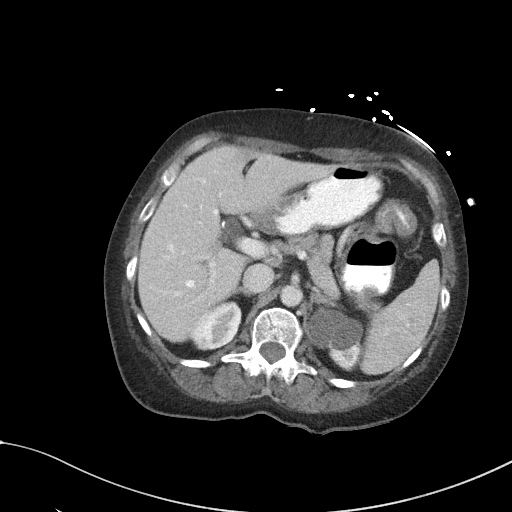
[im 69/91  bone]
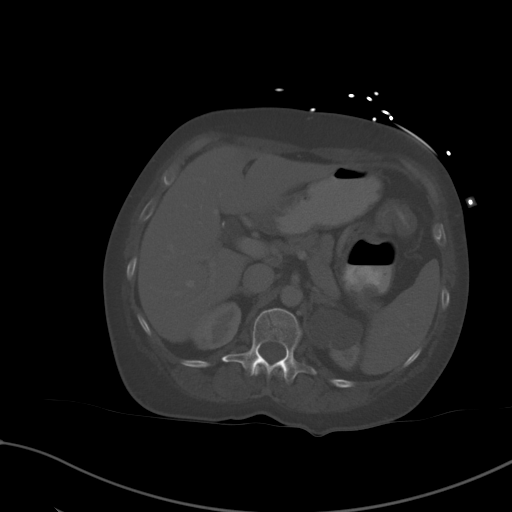
[im 75/91  soft-tissue]
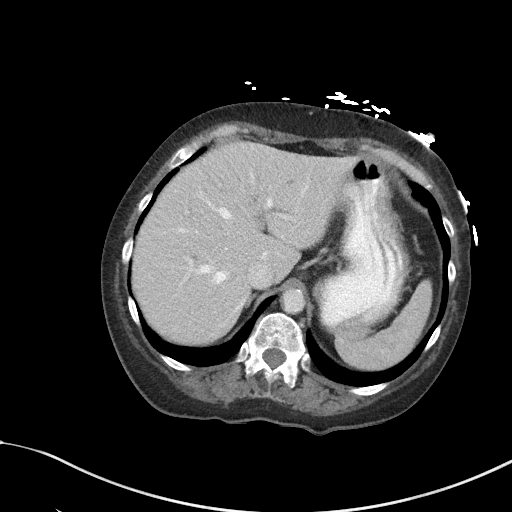
[im 85/91  soft-tissue]
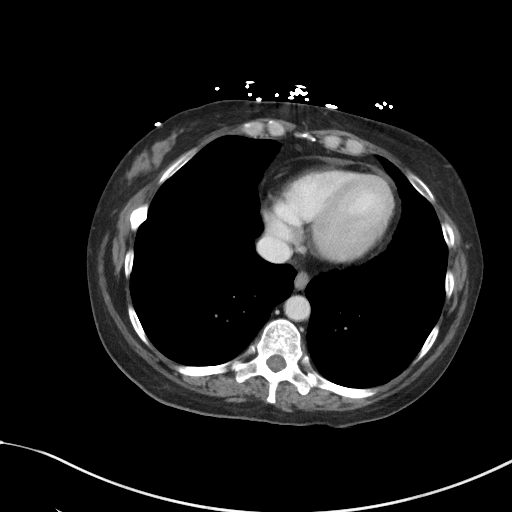

[Series 4: coronal st · coronal · 0.68mm/px · 3 of 101 slices shown]
[im 34/101  soft-tissue]
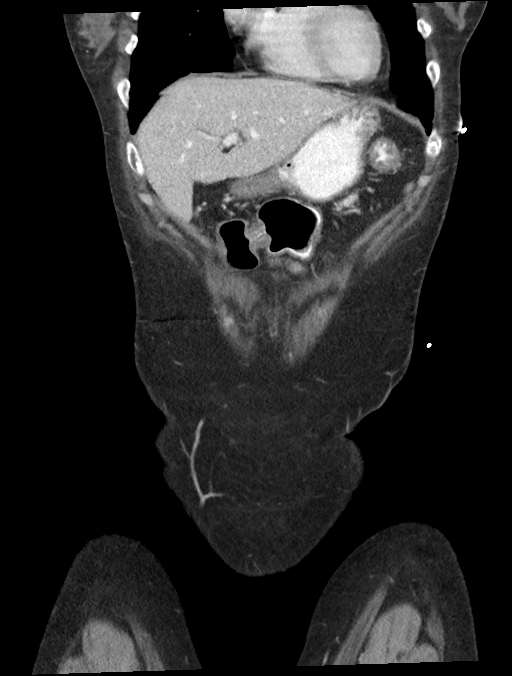
[im 45/101  soft-tissue]
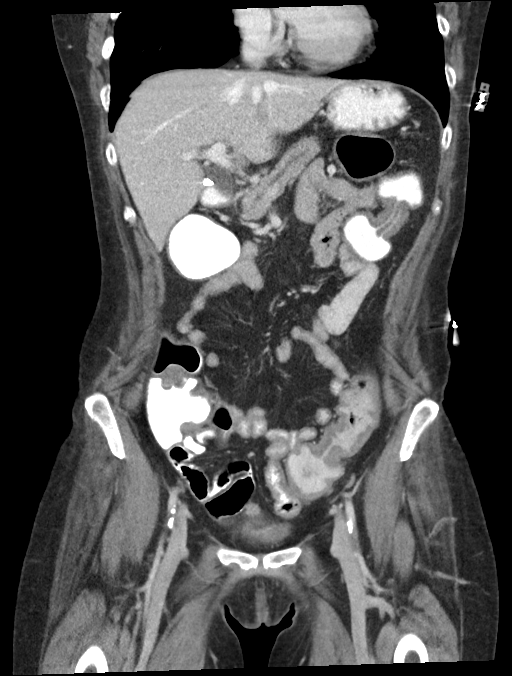
[im 56/101  soft-tissue]
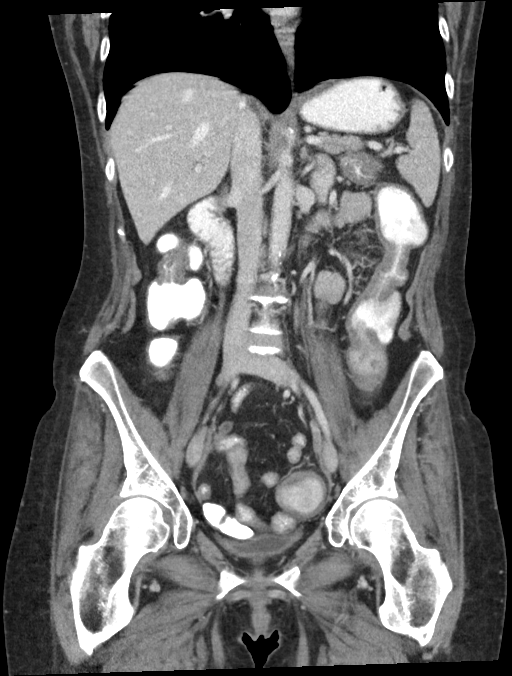

[14 of 46 positions shown; findings below may reference images not displayed]

FINDINGS: Lower chest: Lung bases are clear.

Hepatobiliary: No focal liver lesions are appreciable. The
gallbladder is absent. There is again noted intrahepatic biliary
duct dilatation. The common hepatic duct remains prominent,
measuring 1.2 cm in diameter. There is gradual tapering of the
common bile duct. No mass or calculus is appreciable in the biliary
ductal system.

Pancreas: No pancreatic mass or inflammatory focus is appreciable.
Pancreatic duct is upper normal in size, stable.

Spleen: No splenic lesions are evident.

Adrenals/Urinary Tract: Adrenals bilaterally appear normal. The
previously noted septated cystic mass arising in the upper left
kidney is stable, measuring 3.9 x 2.9 cm. Note that this mass was
recently evaluated on MR. There is a simple appearing cyst in the
posterior upper left kidney measuring 1.2 x 1.0 cm. Tiny nearby cyst
also noted. 7 x 6 mm cyst noted lower pole left kidney. No
hydronephrosis evident on either side. No evident renal or ureteral
calculus on either side. Urinary bladder wall thickness is within
normal limits.

Stomach/Bowel: There is wall thickening throughout the sigmoid colon
and upper rectum. Intermittent wall thickening is noted throughout
portions of the ascending colon and transverse colon and to a
slightly lesser extent in the descending colon. There is wall
thickening at the junction of the terminal ileum and cecum. Ileum
otherwise appears unremarkable. No appreciable fistula noted. There
is no evident bowel obstruction. Most loops of small bowel are fluid
filled.

There is wall thickening in portions of the gastric antrum and
pylorus. Duodenum appears essentially unremarkable by CT.

The appendix has a maximum diameter of 6 mm, compared 8 mm on prior
study. No appendiceal/periappendiceal region inflammation is evident
on this study.

There is no free air or portal venous air.

Vascular/Lymphatic: No abdominal aortic aneurysm. There is aortic
atherosclerosis. Major venous structures appear patent. No evident
adenopathy in the abdomen or pelvis.

Reproductive: The uterus is absent. No adnexal masses are
appreciable.

Other: No ascites or abscess evident in the abdomen or pelvis. No
omental lesions evident. Calcification in the lateral upper pelvic
wall soft tissues may represent residua of previous trauma.

Musculoskeletal: No blastic or lytic bone lesions. No intramuscular
lesions are evident.
IMPRESSION: 1. Wall thickening involving much of the colon with skip type
lesions noted in the ascending, transverse, and descending colon
with more uniform wall thickening throughout the sigmoid colon and
upper rectum. There is wall thickening at the terminal ileum-cecum
junction. This appearance raises question of potential underlying
Crohn's disease. A somewhat atypical presentation of infectious
colitis is possible. No fistula evident. No bowel pneumatosis. Note
that there is aortic atherosclerosis, but major mesenteric vessels
appear patent.

2. Wall thickening in portions of the distal stomach consistent with
gastritis. Note that Crohn's disease could present in this manner.

3. Appendix appears within normal limits. Appendix slightly smaller
compared to recent prior study.

4. Gallbladder absent. Biliary duct dilatation without obstructing
lesion again noted.

5. Septated cystic mass arising from the upper pole left kidney
appear stable. This mass was evaluated with MR recently.

6. No renal or ureteral calculi. No hydronephrosis. Urinary bladder
wall thickness within normal limits.

7.  Uterus absent.  No adnexal masses evident.

8.  Aortic Atherosclerosis ([WH]-[WH]).

7.  Uterus absent.

## 2020-08-18 MED ORDER — IOHEXOL 300 MG/ML  SOLN
75.0000 mL | Freq: Once | INTRAMUSCULAR | Status: AC | PRN
  Administered 2020-08-18: 75 mL via INTRAVENOUS

## 2020-08-18 MED ORDER — ONDANSETRON HCL 4 MG/2ML IJ SOLN
4.0000 mg | Freq: Once | INTRAMUSCULAR | Status: AC
  Administered 2020-08-18: 4 mg via INTRAVENOUS
  Filled 2020-08-18: qty 2

## 2020-08-18 MED ORDER — TRAZODONE HCL 50 MG PO TABS: 100.0000 mg | ORAL_TABLET | Freq: Every day | ORAL | Status: DC

## 2020-08-18 MED ORDER — HYDROXYZINE HCL 25 MG PO TABS: 25.0000 mg | ORAL_TABLET | Freq: Three times a day (TID) | ORAL | Status: DC | PRN

## 2020-08-18 MED ORDER — PANTOPRAZOLE SODIUM 40 MG IV SOLR
40.0000 mg | INTRAVENOUS | Status: DC
  Administered 2020-08-18: 40 mg via INTRAVENOUS
  Filled 2020-08-18: qty 40

## 2020-08-18 MED ORDER — HYDROMORPHONE HCL 1 MG/ML IJ SOLN
1.0000 mg | Freq: Once | INTRAMUSCULAR | Status: AC
  Administered 2020-08-18: 1 mg via INTRAVENOUS
  Filled 2020-08-18: qty 1

## 2020-08-18 MED ORDER — ACETAMINOPHEN 325 MG PO TABS: 650.0000 mg | ORAL_TABLET | Freq: Four times a day (QID) | ORAL | Status: DC | PRN

## 2020-08-18 MED ORDER — SODIUM CHLORIDE 0.9% FLUSH
3.0000 mL | Freq: Two times a day (BID) | INTRAVENOUS | Status: DC
  Administered 2020-08-18: 3 mL via INTRAVENOUS

## 2020-08-18 MED ORDER — SODIUM CHLORIDE 0.9% FLUSH: 3.0000 mL | INTRAVENOUS | Status: DC | PRN

## 2020-08-18 MED ORDER — ONDANSETRON HCL 4 MG PO TABS: 4.0000 mg | ORAL_TABLET | Freq: Four times a day (QID) | ORAL | Status: DC | PRN

## 2020-08-18 MED ORDER — BISACODYL 10 MG RE SUPP
10.0000 mg | Freq: Every day | RECTAL | Status: DC | PRN
Start: 2020-08-18 — End: 2020-08-18

## 2020-08-18 MED ORDER — MORPHINE SULFATE (PF) 4 MG/ML IV SOLN
4.0000 mg | Freq: Once | INTRAVENOUS | Status: AC
  Administered 2020-08-18: 4 mg via INTRAVENOUS
  Filled 2020-08-18: qty 1

## 2020-08-18 MED ORDER — ONDANSETRON HCL 4 MG/2ML IJ SOLN: 4.0000 mg | Freq: Four times a day (QID) | INTRAMUSCULAR | Status: DC | PRN

## 2020-08-18 MED ORDER — CLONAZEPAM 0.5 MG PO TABS: 0.5000 mg | ORAL_TABLET | Freq: Every day | ORAL | Status: DC | PRN

## 2020-08-18 MED ORDER — DEXTROSE-NACL 5-0.45 % IV SOLN: INTRAVENOUS | Status: DC

## 2020-08-18 MED ORDER — POLYETHYLENE GLYCOL 3350 17 G PO PACK: 17.0000 g | PACK | Freq: Every day | ORAL | Status: DC | PRN

## 2020-08-18 MED ORDER — HEPARIN SODIUM (PORCINE) 5000 UNIT/ML IJ SOLN: 5000.0000 [IU] | Freq: Three times a day (TID) | INTRAMUSCULAR | Status: DC

## 2020-08-18 MED ORDER — LACTATED RINGERS IV BOLUS
1000.0000 mL | Freq: Once | INTRAVENOUS | Status: AC
  Administered 2020-08-18: 1000 mL via INTRAVENOUS

## 2020-08-18 MED ORDER — FENTANYL CITRATE (PF) 100 MCG/2ML IJ SOLN: 25.0000 ug | INTRAMUSCULAR | Status: DC | PRN

## 2020-08-18 MED ORDER — ACETAMINOPHEN 650 MG RE SUPP: 650.0000 mg | Freq: Four times a day (QID) | RECTAL | Status: DC | PRN

## 2020-08-18 MED ORDER — NICOTINE 21 MG/24HR TD PT24
21.0000 mg | MEDICATED_PATCH | Freq: Once | TRANSDERMAL | Status: DC
  Administered 2020-08-18: 21 mg via TRANSDERMAL
  Filled 2020-08-18: qty 1

## 2020-08-18 MED ORDER — SODIUM CHLORIDE 0.9 % IV SOLN: 250.0000 mL | INTRAVENOUS | Status: DC | PRN

## 2020-08-18 MED ORDER — OXYCODONE HCL 5 MG PO TABS
5.0000 mg | ORAL_TABLET | ORAL | Status: DC | PRN
Start: 2020-08-18 — End: 2020-08-18

## 2020-08-18 MED ORDER — HYDRALAZINE HCL 20 MG/ML IJ SOLN: 10.0000 mg | Freq: Four times a day (QID) | INTRAMUSCULAR | Status: DC | PRN

## 2020-08-18 MED ORDER — METOPROLOL TARTRATE 5 MG/5ML IV SOLN: 5.0000 mg | Freq: Four times a day (QID) | INTRAVENOUS | Status: DC

## 2020-08-18 NOTE — ED Notes (Signed)
Dr Lewie Chamber informed of elopement.

## 2020-08-18 NOTE — ED Notes (Signed)
Room smells of ETOH.  Pt has cut arm band off.

## 2020-08-18 NOTE — ED Notes (Signed)
Pt has taken off all her cardiac leads, BP cuff and Pulse oximetry.  Pt is fully dressed with husband at bedside.  Pt says she is ready to go so she can smoke a cigarette.  Husband is trying to encourage pt to stay.  Pt says she wants the nicotine patch only and is planning to leave after seeing GI.

## 2020-08-18 NOTE — ED Triage Notes (Signed)
Pt states she is due to have pre-op for appendix removal Friday, with surgery on Monday. Pt states she was told that if the pain got worse to come to the ED.

## 2020-08-18 NOTE — ED Notes (Signed)
Pt attempting urine sample 

## 2020-08-18 NOTE — ED Notes (Signed)
Pt reports her pain and nausea are not improved, EDP made aware, orders to be placed.

## 2020-08-18 NOTE — ED Provider Notes (Signed)
[facility] EMERGENCY DEPARTMENT Provider Note   CSN: [id number] Arrival date & time: 08/18/20  0416   History Chief Complaint  Patient presents with  . Abdominal Pain    [name] is a 50 y.o. female.  The history is provided by the patient.  Abdominal Pain She has history of hypertension, chronic back pain and has been followed for colitis and enlarging appendix and is scheduled to have her appendix removed in 4 days.  Yesterday, she developed severe, crampy right lower quadrant pain which is radiated across the lower abdomen.  There has been associated nausea and vomiting as well as some diarrhea.  Pain does not improve with bowel movement or emesis.  She denies fever chills or sweats.  Pain is rated at 9/10.  Nothing makes it better, nothing makes it worse.  Past Medical History:  Diagnosis Date  . Chronic back pain   . Colitis   . Hypertension   . Migraine   . Plumbism    blood clot     Patient Active Problem List   Diagnosis Date Noted  . Sacroiliac joint pain 01/07/2020  . 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 Long term prescription opiate use 05/19/2018  . Lumbar radiculopathy 02/04/2017  . Lumbar degenerative disc disease 02/04/2017  . Lumbar spondylosis 02/04/2017  . Chronic low back pain with bilateral sciatica 02/04/2017  . Chronic pain syndrome 02/04/2017  . Lumbar sprain 02/04/2017  . Chronic migraine without aura without status migrainosus, not intractable 02/04/2017    Past Surgical History:  Procedure Laterality Date  . ABDOMINAL HYSTERECTOMY    . BACK SURGERY    . CHOLECYSTECTOMY    . HAND SURGERY       OB History    Gravida  2   Para  2   Term  2   Preterm      AB      Living  2     SAB      IAB      Ectopic      Multiple      Live Births              Family History  Problem Relation Age of Onset  . Cancer Other   . Seizures Other   . Stroke Other   . Diabetes Other     Social History   Tobacco Use  . Smoking  status: Current Every Day Smoker    Packs/day: 1.00    Years: 20.00    Pack years: 20.00    Types: Cigarettes  . Smokeless tobacco: Never Used  Vaping Use  . Vaping Use: Never used  Substance Use Topics  . Alcohol use: No  . Drug use: No    Home Medications Prior to Admission medications   Medication Sig Start Date End Date Taking? Authorizing Provider  clonazePAM (KLONOPIN) 0.5 MG tablet Take 0.5 mg by mouth daily as needed for anxiety.  09/12/19   [provider]  famotidine (PEPCID) 20 MG tablet Take 20 mg by mouth 2 (two) times daily. 07/18/20   [provider]  fluticasone (FLONASE) 50 MCG/ACT nasal spray Place 1 spray into both nostrils daily. 12/21/19   [provider]  hydrOXYzine (ATARAX/VISTARIL) 25 MG tablet Take 25 mg by mouth 3 (three) times daily as needed for anxiety.  03/04/19   [provider]  metoprolol succinate (TOPROL-XL) 100 MG 24 hr tablet Take 100 mg by mouth in the morning. Take with or immediately  following a meal.    [provider]  ondansetron (ZOFRAN) 4 MG tablet Take 1 tablet (4 mg total) by mouth every 8 (eight) hours as needed for nausea or vomiting. 08/09/20   Aviva Signs, MD  oxyCODONE-acetaminophen (PERCOCET) 5-325 MG tablet Take 1-2 tablets by mouth every 8 (eight) hours as needed for severe pain. 07/30/20 08/29/20  Gillis Santa, MD  promethazine (PHENERGAN) 25 MG tablet Take 1 tablet (25 mg total) by mouth every 6 (six) hours as needed for nausea or vomiting. 07/14/20   [name], PA-C  temazepam (RESTORIL) 30 MG capsule Take 30 mg by mouth at bedtime as needed for sleep.  01/17/16   [provider]    Allergies    Buprenorphine, Cefaclor, Gabapentin, Ibuprofen, Levofloxacin, Macrobid [nitrofurantoin monohyd macro], Naproxen, and Penicillins  Review of Systems   Review of Systems  Gastrointestinal: Positive for abdominal pain.  All other systems reviewed and are negative.   Physical Exam Updated  Vital Signs BP 94/71 (BP Location: Left Arm)   Pulse 91   Temp (!) 97.4 F (36.3 C) (Oral)   Resp 19   Ht 5\' 1"  (1.549 m)   Wt 52.2 kg   SpO2 98%   BMI 21.73 kg/m   Physical Exam Vitals and nursing note reviewed.   50 year old female, appears uncomfortable, but is in no acute distress. Vital signs are significant for borderline low blood pressure. Oxygen saturation is 98%, which is normal. Head is normocephalic and atraumatic. PERRLA, EOMI. Oropharynx is clear. Neck is nontender and supple without adenopathy or JVD. Back is nontender in the midline.  There is mild right CVA tenderness. Lungs are clear without rales, wheezes, or rhonchi. Chest is nontender. Heart has regular rate and rhythm without murmur. Abdomen is soft, flat, with mild right lower quadrant tenderness.  There is no rebound or guarding.  There are no masses or hepatosplenomegaly and peristalsis is hypoactive. Extremities have no cyanosis or edema, full range of motion is present. Skin is warm and dry without rash. Neurologic: Mental status is normal, cranial nerves are intact, there are no motor or sensory deficits.  ED Results / Procedures / Treatments   Labs (all labs ordered are listed, but only abnormal results are displayed) Labs Reviewed - No data to display  EKG EKG Interpretation  Date/Time:  Thursday August 18 2020 04:37:41 EDT Ventricular Rate:  89 PR Interval:  124 QRS Duration: 120 QT Interval:  347 QTC Calculation: 423 R Axis:   87 Text Interpretation: Sinus rhythm Normal ECG When compared with ECG of 12/28/2019, No significant change was found Confirmed by Delora Fuel ([id number]) on 08/18/2020 4:45:36 AM   Radiology No results found.  Procedures Procedures   Medications Ordered in ED Medications  morphine 4 MG/ML injection 4 mg (has no administration in time range)  ondansetron (ZOFRAN) injection 4 mg (has no administration in time range)  lactated ringers bolus 1,000 mL (has no  administration in time range)    ED Course  I have reviewed the triage vital signs and the nursing notes.  Pertinent labs & imaging results that were available during my care of the patient were reviewed by me and considered in my medical decision making (see chart for details).  MDM Rules/Calculators/A&P Recurrent right lower quadrant pain in patient with known enlarging appendix very concerning for acute appendicitis.  Old records are reviewed, confirming serial CT scans showing an enlarging appendix.  She also had episodes of colitis.  She has had  3 CT scans over the last year with the most recent one being on 07/14/2020 at which time appendix was 52mm.  I have discussed the case with Dr. Constance Haw, on-call for general surgery with a question of whether to get another CT scan or just proceed with urgent appendectomy.  Decision was made to proceed with CT scan as well as screening labs.  She is also given IV fluids, morphine, ondansetron.  She had an adequate pain relief from morphine, she is given a dose of hydromorphone.  ECG is normal.  WBC is normal with normal differential.  Thrombocytosis is present, but appears to be a chronic finding.  Metabolic panel and lipase are unremarkable.  CT scan is pending.  Case is signed out to Dr. Roderic Palau.  Final Clinical Impression(s) / ED Diagnoses Final diagnoses:  RLQ abdominal pain  Thrombocytosis    Rx / DC Orders ED Discharge Orders    None       Delora Fuel, MD 23/95/32 [phone]

## 2020-08-18 NOTE — Consult Note (Addendum)
Referring Provider: Roxan Hockey, MD Primary Care Physician:  Vidal Schwalbe, MD Primary Gastroenterologist:  [facility], last seen 2014  Reason for Consultation:  colitis  HPI: [name] is a 50 y.o. female with history of hypertension, chronic back pain, h/o colitis previously follow by [facility], enlarging appendix with planned appendectomy next week who presented to the emergency department with severe, crampy right lower quadrant pain with radiation across the lower abdomen, associated with nausea and vomiting, diarrhea.  Patient states she has a longstanding history of colitis.  States she used to be followed at [place] but has not been seen in over 10 years.  States at one point she was treated with prednisone but never any long-term oral medication.  Specifically asked me today if she can start [employer].  With further questioning, she states that she thinks she has ulcerative colitis.  No one ever stated she had Crohn's.  Has been managing symptoms over the years with Phenergan and Librax.  Tells me she has had multiple EGDs and colonoscopies.  Over the past 1 year she has had persisting symptoms.  Complains of lower abdominal cramping, vomiting, diarrhea off and on.  More recently having anywhere from 5-10 loose stools daily.  Came to the emergency department because of worsening abdominal pain and vomiting.  Denies any recent antibiotics.  Avoids eating because it worsens her symptoms.  Denies melena or rectal bleeding.  Takes 2-3 BC powders daily.  Takes Pepcid and Tums for heartburn.  CT abdomen pelvis with contrast today showed wall thickening involving much of the colon with skip type lesions noted in the ascending, transverse, descending colon with more uniform wall thickening throughout the sigmoid colon and upper rectum.  Wall thickening noted at the terminal ileum-cecum junction.  Concern for possible underlying Crohn's disease.  She also had wall thickening in portions of the  distal stomach.  Appendix slightly smaller compared to recent study.  Biliary duct dilation without obstructing lesion.  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  Recommended repeat imaging in 6 to 12 months via pre and postcontrast abdominal MRI.  Labs in the ED with normal CBC except for platelets of 468,000, glucose 134, BUN 16, creatinine 1.06  Colonoscopy/EGD 07/21/2009 at [facility]: Actual reports cannot be seen however pathology results showed terminal ileum and random colon biopsies were normal. Stomach biopsy with gastric antral and fundic mucosa with reactive foveolar hyperplasia.  No active gastritis.  Immunohistological stain negative for H. Pylori.  EGD February 2009: Normal esophagus, gastric mucosal abnormality with erythema.  Biopsy showed gastric antral and fundic mucosa with reactive foveolar hyperplasia, no gastritis, H. pylori negative.   Prior to Admission medications   Medication Sig Start Date End Date Taking? Authorizing Provider  clonazePAM (KLONOPIN) 0.5 MG tablet Take 0.5 mg by mouth daily as needed for anxiety.  09/12/19  Yes [provider]  fluticasone (FLONASE) 50 MCG/ACT nasal spray Place 1 spray into both nostrils daily. 12/21/19  Yes [provider]  Patient states she also takes Pepcid AC, Tums, blood pressure medication, oxycodone 5/325 mg 3 daily, BC powders 2-3 daily  Current Facility-Administered Medications  Medication Dose Route Frequency Provider Last Rate Last Admin   0.9 %  sodium chloride infusion  250 mL Intravenous PRN Emokpae, Courage, MD       acetaminophen (TYLENOL) tablet 650 mg  650 mg Oral Q6H PRN Roxan Hockey, MD  Or   acetaminophen (TYLENOL) suppository 650 mg  650 mg Rectal Q6H PRN Emokpae, Courage, MD       bisacodyl (DULCOLAX) suppository 10 mg  10 mg Rectal Daily PRN Emokpae, Courage, MD       clonazePAM (KLONOPIN) tablet 0.5 mg  0.5 mg  Oral Daily PRN Emokpae, Courage, MD       dextrose 5 %-0.45 % sodium chloride infusion   Intravenous Continuous Emokpae, Courage, MD       fentaNYL (SUBLIMAZE) injection 25 mcg  25 mcg Intravenous Q2H PRN Emokpae, Courage, MD       heparin injection 5,000 Units  5,000 Units Subcutaneous Q8H Emokpae, Courage, MD       hydrALAZINE (APRESOLINE) injection 10 mg  10 mg Intravenous Q6H PRN Emokpae, Courage, MD       hydrOXYzine (ATARAX/VISTARIL) tablet 25 mg  25 mg Oral TID PRN Denton Brick, Courage, MD       metoprolol tartrate (LOPRESSOR) injection 5 mg  5 mg Intravenous Q6H Emokpae, Courage, MD       nicotine (NICODERM CQ - dosed in mg/24 hours) patch 21 mg  21 mg Transdermal Once Emokpae, Courage, MD   21 mg at 08/18/20 1251   ondansetron (ZOFRAN) tablet 4 mg  4 mg Oral Q6H PRN Emokpae, Courage, MD       Or   ondansetron (ZOFRAN) injection 4 mg  4 mg Intravenous Q6H PRN Emokpae, Courage, MD       oxyCODONE (Oxy IR/ROXICODONE) immediate release tablet 5 mg  5 mg Oral Q4H PRN Emokpae, Courage, MD       pantoprazole (PROTONIX) injection 40 mg  40 mg Intravenous Q24H Emokpae, Courage, MD   40 mg at 08/18/20 1104   polyethylene glycol (MIRALAX / GLYCOLAX) packet 17 g  17 g Oral Daily PRN Emokpae, Courage, MD       sodium chloride flush (NS) 0.9 % injection 3 mL  3 mL Intravenous Q12H Emokpae, Courage, MD   3 mL at 08/18/20 1104   sodium chloride flush (NS) 0.9 % injection 3 mL  3 mL Intravenous Q12H Emokpae, Courage, MD   3 mL at 08/18/20 1105   sodium chloride flush (NS) 0.9 % injection 3 mL  3 mL Intravenous PRN Emokpae, Courage, MD       traZODone (DESYREL) tablet 100 mg  100 mg Oral QHS Emokpae, Courage, MD       Current Outpatient Medications  Medication Sig Dispense Refill   clonazePAM (KLONOPIN) 0.5 MG tablet Take 0.5 mg by mouth daily as needed for anxiety.      fluticasone (FLONASE) 50 MCG/ACT nasal spray Place 1 spray into both nostrils daily.      Allergies as of 08/18/2020 - Review Complete  08/18/2020  Allergen Reaction Noted   Aripiprazole  08/01/2020   Buprenorphine Other (See Comments) 01/24/2016   Cefaclor     Gabapentin Nausea Only 01/24/2016   Lisinopril Nausea And Vomiting 08/01/2020   Seroquel [quetiapine]  08/18/2020   Doxycycline Rash 08/01/2020   Ibuprofen Swelling and Rash 07/08/2011   Levofloxacin Rash 01/24/2016   Macrobid [nitrofurantoin monohyd macro] Rash 11/30/2014   Naproxen Swelling and Rash 07/08/2011   Penicillins Swelling and Rash 07/08/2011    Past Medical History:  Diagnosis Date   Chronic back pain    Colitis    Hypertension    Migraine    Plumbism    blood clot     Past Surgical History:  Procedure Laterality Date   ABDOMINAL  HYSTERECTOMY     BACK SURGERY     CHOLECYSTECTOMY     Complicated by bile leak requiring ERCP with temporary stenting   COLONOSCOPY WITH ESOPHAGOGASTRODUODENOSCOPY (EGD)  07/2009   [facility]: Op notes cannot be seen through care everywhere however pathology showed terminal ileum and random colon biopsies normal.  Stomach biopsy with gastric antral and fundic mucosa with reactive foveolar hyperplasia.  No active gastritis.  Stains negative for H. pylori.   ESOPHAGOGASTRODUODENOSCOPY  06/2007   [facility]: Normal esophagus, gastric mucosal abnormality with erythema, biopsy showed gastric antral and fundic mucosa with reactive foveolar hyperplasia, no gastritis, no H. pylori.   HAND SURGERY      Family History  Problem Relation Age of Onset   Cancer Other    Seizures Other    Stroke Other    Diabetes Other     Social History   Socioeconomic History   Marital status: Married    Spouse name: benito   Number of children: 2   Years of education: Not on file   Highest education level: Associate degree: occupational, Hotel manager, or vocational program  Occupational History    Comment: not employed  Tobacco Use   Smoking status: Current Every Day Smoker    Packs/day: 1.00    Years: 20.00    Pack years: 20.00     Types: Cigarettes   Smokeless tobacco: Never Used  Scientific laboratory technician Use: Never used  Substance and Sexual Activity   Alcohol use: No   Drug use: No   Sexual activity: Yes    Birth control/protection: Surgical  Other Topics Concern   Not on file  Social History Narrative   Not on file   Social Determinants of Health   Financial Resource Strain: Not on file  Food Insecurity: Not on file  Transportation Needs: Not on file  Physical Activity: Not on file  Stress: Not on file  Social Connections: Not on file  Intimate Partner Violence: Not on file     ROS:  General: Negative for anorexia, weight loss, fever, chills, fatigue, weakness. Eyes: Negative for vision changes.  ENT: Negative for hoarseness, difficulty swallowing , nasal congestion. CV: Negative for chest pain, angina, palpitations, dyspnea on exertion, peripheral edema.  Respiratory: Negative for dyspnea at rest, dyspnea on exertion, cough, sputum, wheezing.  GI: See history of present illness. GU:  Negative for dysuria, hematuria, urinary incontinence, urinary frequency, nocturnal urination.  MS: Negative for joint pain, positive chronic low back pain.  Derm: Negative for rash or itching.  Neuro: Negative for weakness, abnormal sensation, seizure, frequent headaches, memory loss, confusion.  Psych: Negative for anxiety, depression, suicidal ideation, hallucinations.  Endo: Negative for unusual weight change.  Heme: Negative for bruising or bleeding. Allergy: Negative for rash or hives.       Physical Examination: Vital signs in last 24 hours: Temp:  [97.4 F (36.3 C)-98.4 F (36.9 C)] 98.4 F (36.9 C) (04/07 0900) Pulse Rate:  [62-91] 76 (04/07 1130) Resp:  [11-28] 11 (04/07 1130) BP: (94-161)/(61-95) 103/62 (04/07 1130) SpO2:  [97 %-100 %] 100 % (04/07 1130) Weight:  [52.2 kg] 52.2 kg (04/07 0429)    General: Well-nourished, well-developed in no acute distress.  Husband at bedside. Head:  Normocephalic, atraumatic.   Eyes: Conjunctiva pink, no icterus. Mouth: masked Neck: Supple without thyromegaly, masses, or lymphadenopathy.  Lungs: Clear to auscultation bilaterally.  Heart: Regular rate and rhythm, no murmurs rubs or gallops.  Abdomen: Bowel sounds are normal,  ,  nondistended, no hepatosplenomegaly or masses, no abdominal bruits or    hernia , no rebound or guarding.  Moderate rlq tenderness but mild diffuse tenderness Rectal: not performed Extremities: No lower extremity edema, clubbing, deformity.  Neuro: Alert and oriented x 4 , grossly normal neurologically.  Skin: Warm and dry, no rash or jaundice.   Psych: Alert and cooperative, normal mood and affect.        Intake/Output from previous day: No intake/output data recorded. Intake/Output this shift: No intake/output data recorded.  Lab Results: CBC Recent Labs    08/18/20 0437  WBC 7.9  HGB 14.1  HCT 44.2  MCV 97.8  PLT 468*   BMET Recent Labs    08/18/20 0437  NA 138  K 3.7  CL 105  CO2 21*  GLUCOSE 134*  BUN 16  CREATININE 1.06*  CALCIUM 9.0   LFT Recent Labs    08/18/20 0437  BILITOT 0.4  ALKPHOS 116  AST 22  ALT 22  PROT 7.5  ALBUMIN 3.7    Lipase Recent Labs    08/18/20 0437  LIPASE 21    PT/INR No results for input(s): LABPROT, INR in the last 72 hours.    Imaging Studies: MR ABDOMEN WWO CONTRAST  Result Date: 08/04/2020 CLINICAL DATA:  Recent CT demonstrating an upper pole left renal lesion. Colitis. Uterine cancer. Cholecystectomy. EXAM: MRI ABDOMEN WITHOUT AND WITH CONTRAST TECHNIQUE: Multiplanar multisequence MR imaging of the abdomen was performed both before and after the administration of intravenous contrast. CONTRAST:  87mL GADAVIST GADOBUTROL 1 MMOL/ML IV SOLN COMPARISON:  CT of 07/14/2020 FINDINGS: Lower chest: Normal heart size without pericardial or pleural effusion. Hepatobiliary: Tiny right hepatic lobe cyst. Cholecystectomy, without biliary ductal  dilatation. Pancreas:  Normal, without mass or ductal dilatation. Spleen:  Normal in size, without focal abnormality. Adrenals/Urinary Tract: Normal adrenal glands. Normal right kidney. Small left renal cysts. Corresponding the CT abnormality, within the upper pole left kidney is a 3.9 x 3.1 x 3.0 cm lesion on 12/05 and 10/3. Demonstrates enhancement within its peripheral wall as well as 2 thin septa. Present as far back as 2014, where it measured on the order of 2.2 x 2.1 cm. No hydronephrosis. Stomach/Bowel: Normal stomach and small bowel. Previously described left-sided colitis has resolved. Vascular/Lymphatic: Aortic atherosclerosis. Patent renal veins. No retroperitoneal or retrocrural adenopathy. Other:  No ascites. Musculoskeletal: No acute osseous abnormality. IMPRESSION: 1. 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 Of note, it has been present back to 2014 with mild interval enlargement over the past 7+ years. Consider surveillance with pre and post contrast abdominal MRI at 6-12 months. 2.  No acute abdominal process. 3.  Aortic Atherosclerosis (ICD10-I70.0). Electronically Signed   By: Abigail Miyamoto M.D.   On: 08/04/2020 08:44   CT ABDOMEN PELVIS W CONTRAST  Result Date: 08/18/2020 CLINICAL DATA:  Abdominal pain, primarily right cited EXAM: CT ABDOMEN AND PELVIS WITH CONTRAST TECHNIQUE: Multidetector CT imaging of the abdomen and pelvis was performed using the standard protocol following bolus administration of intravenous contrast. CONTRAST:  51mL OMNIPAQUE IOHEXOL 300 MG/ML  SOLN COMPARISON:  CT abdomen and pelvis July 14, 2020; abdominal MR August 03, 2020 FINDINGS: Lower chest: Lung bases are clear. Hepatobiliary: No focal liver lesions are appreciable. The gallbladder is absent. There is again noted intrahepatic biliary duct dilatation. The common hepatic duct remains prominent, measuring 1.2 cm in diameter. There is gradual tapering of the common  bile duct.  No mass or calculus is appreciable in the biliary ductal system. Pancreas: No pancreatic mass or inflammatory focus is appreciable. Pancreatic duct is upper normal in size, stable. Spleen: No splenic lesions are evident. Adrenals/Urinary Tract: Adrenals bilaterally appear normal. 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 Note that this mass was recently evaluated on MR. There is a simple appearing cyst in the posterior upper left kidney measuring 1.2 x 1.0 cm. Tiny nearby cyst also noted. 7 x 6 mm cyst noted lower pole left kidney. No hydronephrosis evident on either side. No evident renal or ureteral calculus on either side. Urinary bladder wall thickness is within normal limits. Stomach/Bowel: There is wall thickening throughout the sigmoid colon and upper rectum. Intermittent wall thickening is noted throughout portions of the ascending colon and transverse colon and to a slightly lesser extent in the descending colon. There is wall thickening at the junction of the terminal ileum and cecum. Ileum otherwise appears unremarkable. No appreciable fistula noted. There is no evident bowel obstruction. Most loops of small bowel are fluid filled. There is wall thickening in portions of the gastric antrum and pylorus. Duodenum appears essentially unremarkable by CT. The appendix has a maximum diameter of 6 mm, compared 8 mm on prior study. No appendiceal/periappendiceal region inflammation is evident on this study. There is no free air or portal venous air. Vascular/Lymphatic: No abdominal aortic aneurysm. There is aortic atherosclerosis. Major venous structures appear patent. No evident adenopathy in the abdomen or pelvis. Reproductive: The uterus is absent. No adnexal masses are appreciable. Other: No ascites or abscess evident in the abdomen or pelvis. No omental lesions evident. Calcification in the lateral upper pelvic wall soft tissues  may represent residua of previous trauma. Musculoskeletal: No blastic or lytic bone lesions. No intramuscular lesions are evident. IMPRESSION: 1. Wall thickening involving much of the colon with skip type lesions noted in the ascending, transverse, and descending colon with more uniform wall thickening throughout the sigmoid colon and upper rectum. There is wall thickening at the terminal ileum-cecum junction. This appearance raises question of potential underlying Crohn's disease. A somewhat atypical presentation of infectious colitis is possible. No fistula evident. No bowel pneumatosis. Note that there is aortic atherosclerosis, but major mesenteric vessels appear patent. 2. Wall thickening in portions of the distal stomach consistent with gastritis. Note that Crohn's disease could present in this manner. 3. Appendix appears within normal limits. Appendix slightly smaller compared to recent prior study. 4. Gallbladder absent. Biliary duct dilatation without obstructing lesion again noted. 5. Septated cystic mass arising from the upper pole left kidney appear stable. This mass was evaluated with MR recently. 6. No renal or ureteral calculi. No hydronephrosis. Urinary bladder wall thickness within normal limits. 7.  Uterus absent.  No adnexal masses evident. 8.  Aortic Atherosclerosis (ICD10-I70.0). 7.  Uterus absent. Electronically Signed   By: Lowella Grip III M.D.   On: 08/18/2020 09:35  [4 week]   Impression: Pleasant 50 year old female presenting to the emergency department with complaints of right lower quadrant pain, nausea/vomiting/diarrhea.  Patient was on the schedule for appendectomy next week for enlarging appendix/rule out mucocele.  GI consulted for colitis seen on CT.  Patient reports a long history of colitis, previously seen by [facility] but has not been seen by them since 2014.  She describes receiving prednisone in the past for her colitis.  Denies chronic treatment otherwise.  EGD and  colonoscopy  reports unavailable to me.  Pathology from 2011 colonoscopy showed normal terminal ileum and random colon biopsies.  Patient presenting now with chronic diarrhea, intermittent lower abdominal pain/right lower quadrant pain and vomiting.  CT today with wall thickening involving much of the colon with skip type lesions in the ascending, transverse, descending colon with more uniform wall thickening throughout the sigmoid colon and upper rectum, wall thickening at the terminal ileum-cecum junction as well.  Findings concerning for Crohn's disease.  Of note CT earlier last month showed a long segment of abnormal colon as well.  Patient also with wall thickening of the distal stomach.  Appendix appears unremarkable today.  Plan: Advised patient that she should highly consider admission for management of acute symptoms as well as to expedite her care.  At this time patient is refusing. Recommend outpatient supportive care, antiemetics. Recommend soft diet. Patient needs colonoscopy and likely upper endoscopy in the near future, if she does not stay overnight, we will try to expedite her care as an outpatient. Consider stool studies for completeness although suspect CT findings and symptoms likely related to inflammatory bowel disease. To discuss possible empiric use of budesonide or prednisone (if she cannot afford budesonide). Further recommendations to follow.   We would like to thank you for the opportunity to participate in the care of [name].  Laureen Ochs. Bernarda Caffey [facility] [phone] 4/7/20222:41 PM     LOS: 0 days     Attending note: Apparently patient left the hospital before she could be seen by me.

## 2020-08-18 NOTE — ED Notes (Signed)
Pt is drowsy during treatment.

## 2020-08-18 NOTE — ED Notes (Signed)
Husband at bedside.  

## 2020-08-18 NOTE — ED Notes (Addendum)
Pt left without informing staff.  Called pt's husband and spoke with him and pt.  Both reports pt removing IV site.  Pt was upset because she did not receive nausea and pain medications at discharge.  Pt eloped.

## 2020-08-18 NOTE — ED Notes (Signed)
Pt is currently standing at bedside eating chips.

## 2020-08-18 NOTE — ED Notes (Addendum)
Pt visitor to bedside, medicated per [facility], IVF infusing without complication. Oral contrast at bedside, pt educated on consumption, pt verbalizes understanding. Urine requested, pt reports she cannot provide a sample at this time. On cardiac monitoring,VS updated and stable. Pt denies further needs at this time. Side rails up x 2, bed locked and low, call bell within reach. Will continue to monitor.

## 2020-08-18 NOTE — ED Notes (Signed)
Pt is asking to leave.  Wants Nausea and Pain medications.  Dr Roderic Palau informed.  Pt is very drowsy when discussing the importance of her staying and completing treatment plan.  Pt says she will let me know once of husband returns if she is going to leave AMA.

## 2020-08-18 NOTE — ED Notes (Signed)
IV still in place, pt and husband instructed to inform me or staff when/if she decides to leave.

## 2020-08-18 NOTE — ED Notes (Signed)
Pt wants to hold off on IVF but wants Protonix.

## 2020-08-18 NOTE — Progress Notes (Signed)
[facility]  Patient left AMA before I could see her. She has colitis/ potentially UC based on Neil Crouch discussion with her and her reports from [place]. Appendix is normal on the most recent CT and no concern for dilation.  She needs colonoscopy and workup. No plans for appendectomy on Monday. Will cancel her case.  Updated Dr. Arnoldo Morale and confirmed he was ok with plan for aborting the appendectomy.  Office calling the OR to cancel.  Curlene Labrum, MD [facility] [address] [place], [place] [phone] (office)

## 2020-08-18 NOTE — Consult Note (Signed)
Patient Demographics:    [name], is a 50 y.o. female  MRN: [id number]   DOB - Jun 08, 1970  Admit Date - 08/18/2020  Outpatient Primary MD for the patient is Vidal Schwalbe, MD   Assessment & Plan:    Active Problems:   Colitis   -Notified by ED RN--the patient eloped from the ED prior to being completely admitted --please see  EDP and GI service notes -Patient was scheduled to see general surgery, GI service--- with plans for possible steroid use and possible colonoscopy for colitis/IBD diagnosis  --Patient left the ED/eloped prior to being fully admitted  Disposition--- patient eloped   With History of - Reviewed by me  Past Medical History:  Diagnosis Date  . Chronic back pain   . Colitis   . Hypertension   . Migraine   . Plumbism    blood clot       Past Surgical History:  Procedure Laterality Date  . ABDOMINAL HYSTERECTOMY    . BACK SURGERY    . CHOLECYSTECTOMY    . HAND SURGERY        Chief Complaint  Patient presents with  . Abdominal Pain      HPI:    [name]  is a 50 y.o. female presenting with concerns for colitis, patient also has chronic pain syndrome she goes to [facility]-oxycodone monthly  - --Notified by ED RN--the patient eloped from the ED prior to being completely admitted --please see  EDP and GI service notes -Patient was scheduled to see general surgery, GI service--- with plans for possible steroid use and possible colonoscopy for colitis/IBD diagnosis  --Patient left the ED/eloped prior to being fully admitted    Review of systems:    In addition to the HPI above,   A full Review of  Systems was done, all other systems reviewed are negative except as noted above in HPI , .    Social History:  Reviewed by me    Social History    Tobacco Use  . Smoking status: Current Every Day Smoker    Packs/day: 1.00    Years: 20.00    Pack years: 20.00    Types: Cigarettes  . Smokeless tobacco: Never Used  Substance Use Topics  . Alcohol use: No       Family History :  Reviewed by me    Family History  Problem Relation Age of Onset  . Cancer Other   . Seizures Other   . Stroke Other   . Diabetes Other      Home Medications:   Prior to Admission medications   Medication Sig Start Date End Date Taking? Authorizing Provider  clonazePAM (KLONOPIN) 0.5 MG tablet Take 0.5 mg by mouth daily as needed for anxiety.  09/12/19  Yes [provider]  fluticasone (FLONASE) 50 MCG/ACT nasal spray Place 1 spray into both nostrils daily. 12/21/19  Yes [provider]  Allergies:     Allergies  Allergen Reactions  . Aripiprazole     Other reaction(s): seizure  . Buprenorphine Other (See Comments)    Chest pain/ringing in hears and feet swelling  . Cefaclor   . Gabapentin Nausea Only    Vomiting  And dizzy  . Lisinopril Nausea And Vomiting  . Seroquel [Quetiapine]     Seizure (08/18/20)  . Doxycycline Rash  . Ibuprofen Swelling and Rash  . Levofloxacin Rash    Itching  . Macrobid [Nitrofurantoin Monohyd Macro] Rash  . Naproxen Swelling and Rash  . Penicillins Swelling and Rash          Physical Exam:   Vitals  Blood pressure 95/73, pulse 74, temperature 98.4 F (36.9 C), temperature source Oral, resp. rate 11, height 5\' 1"  (1.549 m), weight 52.2 kg, SpO2 99 %.   --Notified by ED RN--the patient eloped from the ED prior to being completely admitted --please see  EDP and GI service notes -Patient was scheduled to see general surgery, GI service--- with plans for possible steroid use and possible colonoscopy for colitis/IBD diagnosis  --Patient left the ED/eloped prior to being fully admitted     Data Review:    CBC Recent Labs  Lab 08/18/20 0437  WBC 7.9  HGB 14.1  HCT 44.2   PLT 468*  MCV 97.8  MCH 31.2  MCHC 31.9  RDW 15.3  LYMPHSABS 2.0  MONOABS 0.4  EOSABS 0.0  BASOSABS 0.1   ------------------------------------------------------------------------------------------------------------------  Chemistries  Recent Labs  Lab 08/18/20 0437  NA 138  K 3.7  CL 105  CO2 21*  GLUCOSE 134*  BUN 16  CREATININE 1.06*  CALCIUM 9.0  AST 22  ALT 22  ALKPHOS 116  BILITOT 0.4   ------------------------------------------------------------------------------------------------------------------ estimated creatinine clearance is 47.4 mL/min (A) (by C-G formula based on SCr of 1.06 mg/dL (H)). ------------------------------------------------------------------------------------------------------------------ No results for input(s): TSH, T4TOTAL, T3FREE, THYROIDAB in the last 72 hours.  Invalid input(s): FREET3   Coagulation profile No results for input(s): INR, PROTIME in the last 168 hours. ------------------------------------------------------------------------------------------------------------------- No results for input(s): DDIMER in the last 72 hours. -------------------------------------------------------------------------------------------------------------------  Cardiac Enzymes No results for input(s): CKMB, TROPONINI, MYOGLOBIN in the last 168 hours.  Invalid input(s): CK ------------------------------------------------------------------------------------------------------------------ No results found for: BNP   ---------------------------------------------------------------------------------------------------------------  Urinalysis    Component Value Date/Time   COLORURINE YELLOW 08/18/2020 [place] 08/18/2020 0456   LABSPEC <1.005 (L) 08/18/2020 0456   PHURINE 5.5 08/18/2020 0456   GLUCOSEU NEGATIVE 08/18/2020 0456   HGBUR NEGATIVE 08/18/2020 0456   BILIRUBINUR NEGATIVE 08/18/2020 0456   KETONESUR NEGATIVE  08/18/2020 0456   PROTEINUR NEGATIVE 08/18/2020 0456   UROBILINOGEN 0.2 03/24/2013 1422   NITRITE NEGATIVE 08/18/2020 0456   LEUKOCYTESUR NEGATIVE 08/18/2020 0456    ----------------------------------------------------------------------------------------------------------------   Imaging Results:    CT ABDOMEN PELVIS W CONTRAST  Result Date: 08/18/2020 CLINICAL DATA:  Abdominal pain, primarily right cited EXAM: CT ABDOMEN AND PELVIS WITH CONTRAST TECHNIQUE: Multidetector CT imaging of the abdomen and pelvis was performed using the standard protocol following bolus administration of intravenous contrast. CONTRAST:  30mL OMNIPAQUE IOHEXOL 300 MG/ML  SOLN COMPARISON:  CT abdomen and pelvis July 14, 2020; abdominal MR August 03, 2020 FINDINGS: Lower chest: Lung bases are clear. Hepatobiliary: No focal liver lesions are appreciable. The gallbladder is absent. There is again noted intrahepatic biliary duct dilatation. The common hepatic duct remains prominent, measuring 1.2 cm in diameter. There is gradual tapering of the common  bile duct. No mass or calculus is appreciable in the biliary ductal system. Pancreas: No pancreatic mass or inflammatory focus is appreciable. Pancreatic duct is upper normal in size, stable. Spleen: No splenic lesions are evident. Adrenals/Urinary Tract: Adrenals bilaterally appear normal. 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 Note that this mass was recently evaluated on MR. There is a simple appearing cyst in the posterior upper left kidney measuring 1.2 x 1.0 cm. Tiny nearby cyst also noted. 7 x 6 mm cyst noted lower pole left kidney. No hydronephrosis evident on either side. No evident renal or ureteral calculus on either side. Urinary bladder wall thickness is within normal limits. Stomach/Bowel: There is wall thickening throughout the sigmoid colon and upper rectum. Intermittent wall thickening is noted  throughout portions of the ascending colon and transverse colon and to a slightly lesser extent in the descending colon. There is wall thickening at the junction of the terminal ileum and cecum. Ileum otherwise appears unremarkable. No appreciable fistula noted. There is no evident bowel obstruction. Most loops of small bowel are fluid filled. There is wall thickening in portions of the gastric antrum and pylorus. Duodenum appears essentially unremarkable by CT. The appendix has a maximum diameter of 6 mm, compared 8 mm on prior study. No appendiceal/periappendiceal region inflammation is evident on this study. There is no free air or portal venous air. Vascular/Lymphatic: No abdominal aortic aneurysm. There is aortic atherosclerosis. Major venous structures appear patent. No evident adenopathy in the abdomen or pelvis. Reproductive: The uterus is absent. No adnexal masses are appreciable. Other: No ascites or abscess evident in the abdomen or pelvis. No omental lesions evident. Calcification in the lateral upper pelvic wall soft tissues may represent residua of previous trauma. Musculoskeletal: No blastic or lytic bone lesions. No intramuscular lesions are evident. IMPRESSION: 1. Wall thickening involving much of the colon with skip type lesions noted in the ascending, transverse, and descending colon with more uniform wall thickening throughout the sigmoid colon and upper rectum. There is wall thickening at the terminal ileum-cecum junction. This appearance raises question of potential underlying Crohn's disease. A somewhat atypical presentation of infectious colitis is possible. No fistula evident. No bowel pneumatosis. Note that there is aortic atherosclerosis, but major mesenteric vessels appear patent. 2. Wall thickening in portions of the distal stomach consistent with gastritis. Note that Crohn's disease could present in this manner. 3. Appendix appears within normal limits. Appendix slightly smaller  compared to recent prior study. 4. Gallbladder absent. Biliary duct dilatation without obstructing lesion again noted. 5. Septated cystic mass arising from the upper pole left kidney appear stable. This mass was evaluated with MR recently. 6. No renal or ureteral calculi. No hydronephrosis. Urinary bladder wall thickness within normal limits. 7.  Uterus absent.  No adnexal masses evident. 8.  Aortic Atherosclerosis (ICD10-I70.0). 7.  Uterus absent. Electronically Signed   By: Lowella Grip III M.D.   On: 08/18/2020 09:35    Radiological Exams on Admission: CT ABDOMEN PELVIS W CONTRAST  Result Date: 08/18/2020 CLINICAL DATA:  Abdominal pain, primarily right cited EXAM: CT ABDOMEN AND PELVIS WITH CONTRAST TECHNIQUE: Multidetector CT imaging of the abdomen and pelvis was performed using the standard protocol following bolus administration of intravenous contrast. CONTRAST:  54mL OMNIPAQUE IOHEXOL 300 MG/ML  SOLN COMPARISON:  CT abdomen and pelvis July 14, 2020; abdominal MR August 03, 2020 FINDINGS: Lower chest: Lung bases are clear. Hepatobiliary: No focal  liver lesions are appreciable. The gallbladder is absent. There is again noted intrahepatic biliary duct dilatation. The common hepatic duct remains prominent, measuring 1.2 cm in diameter. There is gradual tapering of the common bile duct. No mass or calculus is appreciable in the biliary ductal system. Pancreas: No pancreatic mass or inflammatory focus is appreciable. Pancreatic duct is upper normal in size, stable. Spleen: No splenic lesions are evident. Adrenals/Urinary Tract: Adrenals bilaterally appear normal. 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 Note that this mass was recently evaluated on MR. There is a simple appearing cyst in the posterior upper left kidney measuring 1.2 x 1.0 cm. Tiny nearby cyst also noted. 7 x 6 mm cyst noted lower pole left kidney. No hydronephrosis  evident on either side. No evident renal or ureteral calculus on either side. Urinary bladder wall thickness is within normal limits. Stomach/Bowel: There is wall thickening throughout the sigmoid colon and upper rectum. Intermittent wall thickening is noted throughout portions of the ascending colon and transverse colon and to a slightly lesser extent in the descending colon. There is wall thickening at the junction of the terminal ileum and cecum. Ileum otherwise appears unremarkable. No appreciable fistula noted. There is no evident bowel obstruction. Most loops of small bowel are fluid filled. There is wall thickening in portions of the gastric antrum and pylorus. Duodenum appears essentially unremarkable by CT. The appendix has a maximum diameter of 6 mm, compared 8 mm on prior study. No appendiceal/periappendiceal region inflammation is evident on this study. There is no free air or portal venous air. Vascular/Lymphatic: No abdominal aortic aneurysm. There is aortic atherosclerosis. Major venous structures appear patent. No evident adenopathy in the abdomen or pelvis. Reproductive: The uterus is absent. No adnexal masses are appreciable. Other: No ascites or abscess evident in the abdomen or pelvis. No omental lesions evident. Calcification in the lateral upper pelvic wall soft tissues may represent residua of previous trauma. Musculoskeletal: No blastic or lytic bone lesions. No intramuscular lesions are evident. IMPRESSION: 1. Wall thickening involving much of the colon with skip type lesions noted in the ascending, transverse, and descending colon with more uniform wall thickening throughout the sigmoid colon and upper rectum. There is wall thickening at the terminal ileum-cecum junction. This appearance raises question of potential underlying Crohn's disease. A somewhat atypical presentation of infectious colitis is possible. No fistula evident. No bowel pneumatosis. Note that there is aortic  atherosclerosis, but major mesenteric vessels appear patent. 2. Wall thickening in portions of the distal stomach consistent with gastritis. Note that Crohn's disease could present in this manner. 3. Appendix appears within normal limits. Appendix slightly smaller compared to recent prior study. 4. Gallbladder absent. Biliary duct dilatation without obstructing lesion again noted. 5. Septated cystic mass arising from the upper pole left kidney appear stable. This mass was evaluated with MR recently. 6. No renal or ureteral calculi. No hydronephrosis. Urinary bladder wall thickness within normal limits. 7.  Uterus absent.  No adnexal masses evident. 8.  Aortic Atherosclerosis (ICD10-I70.0). 7.  Uterus absent. Electronically Signed   By: Lowella Grip III M.D.   On: 08/18/2020 09:35   Family Communication: -Pleaded with patient and husband to stay for further evaluation by GI and general surgery service----patient eloped  Roxan Hockey M.D on 08/18/2020 at 12:21 PM Go to [email or web address] -  for contact info  [facility] - Office  [phone]

## 2020-08-18 NOTE — Progress Notes (Signed)
[facility]  CT with extensive colitis and normal appearing appendix. Dr. Arnoldo Morale had been planning for appendectomy for dilated appendix to rule out mucocele but given the colitis and concern for potential Crohn's this needs to be put on hold.  Would get GI involved regarding Crohn's colitis/ Hospitalist admission.  I will see later today.  Curlene Labrum, MD [facility] [address] [place], [place] [address] [phone] (office)

## 2020-08-18 NOTE — Patient Instructions (Signed)
[name]  08/18/2020     @PREFPERIOPPHARMACY @   Your procedure is scheduled on  08/22/2020   Report to [facility] at  [email or web address]   Call this number if you have problems the morning of surgery:  [phone]   Remember:  Do not eat or drink after midnight.                       Take these medicines the morning of surgery with A SIP OF WATER  Klonopin, pepcid, hydroxyzine, metoprolol, zofran (if needed), oxycodone (if needed).  Place clean sheets on your bed the night before your procedure and DO NOT sleep with pets this night.  Shower with CHG the night before and the morning of your procedure. DO NOT put CHG on your face, hair or genitals.  After each shower, dry off with a clean towel, put on clean, comfortable clothes and brush your teeth.       Do not wear jewelry, make-up or nail polish.  Do not wear lotions, powders, or perfumes, or deodorant.  Do not shave 48 hours prior to surgery.  Men may shave face and neck.  Do not bring valuables to the hospital.  [facility] is not responsible for any belongings or valuables.   Contacts, dentures or bridgework may not be worn into surgery.  Leave your suitcase in the car.  After surgery it may be brought to your room.  For patients admitted to the hospital, discharge time will be determined by your treatment team.  Patients discharged the day of surgery will not be allowed to drive home and must have someone with them for 24 hours.     Special instructions:  DO NOT smoke tobacco or vape with in 24 hours of your procedure.   Please read over the following fact sheets that you were given. Coughing and Deep Breathing, Surgical Site Infection Prevention, Anesthesia Post-op Instructions and Care and Recovery After Surgery       Laparoscopic Appendectomy, Adult, Care After This sheet gives you information about how to care for yourself after your procedure. Your doctor may also give you more specific  instructions. If you have problems or questions, contact your doctor. What can I expect after the procedure? After the procedure, it is common to have:  Little energy for normal activities.  Mild pain in the area where the cuts from surgery (incisions) were made.  Trouble pooping (constipation). This can be caused by: ? Pain medicine. ? A lack of activity. Follow these instructions at home: Medicines  Take over-the-counter and prescription medicines only as told by your doctor.  If you were prescribed an antibiotic medicine, take it as told by your doctor. Do not stop taking it even if you start to feel better.  Do not drive or use heavy machinery while taking prescription pain medicine.  Ask your doctor if the medicine you are taking can cause trouble pooping. You may need to take steps to prevent or treat trouble pooping: ? Drink enough fluid to keep your pee (urine) pale yellow. ? Take over-the-counter or prescription medicines. ? Eat foods that are high in fiber. These include beans, whole grains, and fresh fruits and vegetables. ? Limit foods that are high in fat and sugar. These include fried or sweet foods. Incision care  Follow instructions from your doctor about how to take care of your cuts from surgery. Make sure you: ? Wash  your hands with soap and water before and after you change your bandage (dressing). If you cannot use soap and water, use hand sanitizer. ? Change your bandage as told by your doctor. ? Leave stitches (sutures), skin glue, or skin tape (adhesive) strips in place. They may need to stay in place for 2 weeks or longer. If tape strips get loose and curl up, you may trim the loose edges. Do not remove tape strips completely unless your doctor says it is okay.  Check your cuts from surgery every day for signs of infection. Check for: ? Redness, swelling, or pain. ? Fluid or blood. ? Warmth. ? Pus or a bad smell.   Bathing  Keep your cuts from surgery  clean and dry. Clean them as told by your doctor. To do this: 1. Gently wash the cuts with soap and water. 2. Rinse the cuts with water to remove all soap. 3. Pat the cuts dry with a clean towel. Do not rub the cuts.  Do not take baths, swim, or use a hot tub for 2 weeks, or until your doctor says it is okay. You may take showers after 48 hours. Activity  Do not drive for 24 hours if you were given a medicine to help you relax (sedative) during your procedure.  Rest after the procedure. Return to your normal activities as told by your doctor. Ask your doctor what activities are safe for you.  For 3 weeks, or for as long as told by your doctor: ? Do not lift anything that is heavier than 10 lb (4.5 kg), or the limit that you are told. ? Do not play contact sports.   General instructions  If you were sent home with a drain, follow instructions from your doctor on how to care for it.  Take deep breaths. This helps to keep your lungs from getting an infection (pneumonia).  Keep all follow-up visits as told by your doctor. This is important. Contact a doctor if:  You have redness, swelling, or pain around a cut from surgery.  You have fluid or blood coming from a cut.  Your cut feels warm to the touch.  You have pus or a bad smell coming from a cut or a bandage.  The edges of a cut break open after the stitches have been taken out.  You have pain in your shoulders that gets worse.  You feel dizzy or you pass out (faint).  You have shortness of breath.  You keep feeling sick to your stomach (nauseous).  You keep throwing up (vomiting).  You get watery poop (diarrhea) or you cannot control your poop.  You lose your appetite.  You have swelling or pain in your legs.  You get a rash. Get help right away if:  You have a fever.  You have trouble breathing.  You have sharp pains in your chest. Summary  After the procedure, it is common to have low energy, mild pain,  and trouble pooping.  Infection is a common problem after this procedure. Follow your doctor's instructions about caring for yourself after the procedure.  Rest after the procedure. Return to your normal activities as told by your doctor.  Contact your doctor if you see signs of infection around your cuts from surgery, or you get short of breath. Get help right away if you have a fever, chest pain, or trouble breathing. This information is not intended to replace advice given to you by your health care provider.  Make sure you discuss any questions you have with your health care provider. Document Revised: 10/31/2017 Document Reviewed: 10/31/2017 Elsevier Patient Education  2021 [place] Anesthesia, Adult, Care After This sheet gives you information about how to care for yourself after your procedure. Your health care provider may also give you more specific instructions. If you have problems or questions, contact your health care provider. What can I expect after the procedure? After the procedure, the following side effects are common:  Pain or discomfort at the IV site.  Nausea.  Vomiting.  Sore throat.  Trouble concentrating.  Feeling cold or chills.  Feeling weak or tired.  Sleepiness and fatigue.  Soreness and body aches. These side effects can affect parts of the body that were not involved in surgery. Follow these instructions at home: For the time period you were told by your health care provider:  Rest.  Do not participate in activities where you could fall or become injured.  Do not drive or use machinery.  Do not drink alcohol.  Do not take sleeping pills or medicines that cause drowsiness.  Do not make important decisions or sign legal documents.  Do not take care of children on your own.   Eating and drinking  Follow any instructions from your health care provider about eating or drinking restrictions.  When you feel hungry, start by  eating small amounts of foods that are soft and easy to digest (bland), such as toast. Gradually return to your regular diet.  Drink enough fluid to keep your urine pale yellow.  If you vomit, rehydrate by drinking water, juice, or clear broth. General instructions  If you have sleep apnea, surgery and certain medicines can increase your risk for breathing problems. Follow instructions from your health care provider about wearing your sleep device: ? Anytime you are sleeping, including during daytime naps. ? While taking prescription pain medicines, sleeping medicines, or medicines that make you drowsy.  Have a responsible adult stay with you for the time you are told. It is important to have someone help care for you until you are awake and alert.  Return to your normal activities as told by your health care provider. Ask your health care provider what activities are safe for you.  Take over-the-counter and prescription medicines only as told by your health care provider.  If you smoke, do not smoke without supervision.  Keep all follow-up visits as told by your health care provider. This is important. Contact a health care provider if:  You have nausea or vomiting that does not get better with medicine.  You cannot eat or drink without vomiting.  You have pain that does not get better with medicine.  You are unable to pass urine.  You develop a skin rash.  You have a fever.  You have redness around your IV site that gets worse. Get help right away if:  You have difficulty breathing.  You have chest pain.  You have blood in your urine or stool, or you vomit blood. Summary  After the procedure, it is common to have a sore throat or nausea. It is also common to feel tired.  Have a responsible adult stay with you for the time you are told. It is important to have someone help care for you until you are awake and alert.  When you feel hungry, start by eating small amounts  of foods that are soft and easy to digest (bland), such as toast. Gradually return  to your regular diet.  Drink enough fluid to keep your urine pale yellow.  Return to your normal activities as told by your health care provider. Ask your health care provider what activities are safe for you. This information is not intended to replace advice given to you by your health care provider. Make sure you discuss any questions you have with your health care provider. Document Revised: 01/14/2020 Document Reviewed: 08/13/2019 Elsevier Patient Education  2021 [employer].

## 2020-08-18 NOTE — ED Notes (Signed)
Ambulating to BR without assistance, gait steady.

## 2020-08-18 NOTE — ED Notes (Signed)
GI at bedside

## 2020-08-19 ENCOUNTER — Telehealth: Payer: Self-pay | Admitting: Gastroenterology

## 2020-08-19 ENCOUNTER — Encounter (HOSPITAL_COMMUNITY): Payer: Self-pay

## 2020-08-19 ENCOUNTER — Encounter (HOSPITAL_COMMUNITY)
Admission: RE | Admit: 2020-08-19 | Discharge: 2020-08-19 | Disposition: A | Discharge status: Home / Self Care | Payer: BC Managed Care – PPO | Source: Ambulatory Visit | Attending: General Surgery | Admitting: General Surgery

## 2020-08-19 ENCOUNTER — Other Ambulatory Visit (HOSPITAL_COMMUNITY): Admission: RE | Admit: 2020-08-19 | Payer: BC Managed Care – PPO | Source: Ambulatory Visit

## 2020-08-19 DIAGNOSIS — K529 Noninfective gastroenteritis and colitis, unspecified: Secondary | ICD-10-CM

## 2020-08-19 MED ORDER — PREDNISONE 10 MG PO TABS: ORAL_TABLET | ORAL | 0 refills | Status: DC

## 2020-08-19 MED ORDER — ONDANSETRON HCL 4 MG PO TABS: 4.0000 mg | ORAL_TABLET | Freq: Four times a day (QID) | ORAL | 0 refills | Status: DC | PRN

## 2020-08-19 NOTE — Telephone Encounter (Signed)
What about Dr. Abbey Chatters. They both have agreed to help each other if urgent needs and patient is agreeable. This patient has not met either provider.

## 2020-08-19 NOTE — Telephone Encounter (Signed)
1. Please schedule with first available provider for TCS/EGD with propofol. ASA II. (diagnosis: Abnormal terminal ileum/colon and stomach on CT, abdominal pain, vomiting, diarrhea).

## 2020-08-19 NOTE — Telephone Encounter (Signed)
Dr. Gala Romney only has 08/25/20 but you would need to ask him if 3rd double could be added.

## 2020-08-19 NOTE — Telephone Encounter (Signed)
Spoke with pt and she is aware that medication Prednisone and Zofran were sent to her pharmacy. Pt will pick up containers to start stool studies as requested. Pt is aware that she will be scheduled for EGD/TCS.

## 2020-08-19 NOTE — Telephone Encounter (Signed)
[name] seen in the emergency department yesterday for right lower quadrant pain, nausea/vomiting/diarrhea.  CT scan showed findings concerning for Crohn's disease as well as wall thickening of the distal stomach.  Prior to [name] leaving, we discussed treatment plan if she chose not to stay in the hospital.  She was in agreement.  Discussed plan with Dr. Gala Romney as well.  1. We will start prednisone for likely IBD. Prednisone 30 mg daily for 7 days, 20 mg daily for 7 days, 10 mg daily for 7 days, 5 mg daily for 7 days. 2. We will send prescription for nausea and vomiting, Zofran 4 mg every 6 hours as needed. 3. She needs to complete stool studies, GI pathogen panel at [facility]. 4. Please schedule colonoscopy and upper endoscopy (diagnosis: Abnormal terminal ileum/colon and stomach on CT, abdominal pain, vomiting, diarrhea).  Propofol. ASA II. timeline needs to be within the next few weeks, put on cancellation list if needed. She is a [name] but may need for [name] to due based on scheduling available. Let me know what [name] has available.

## 2020-08-19 NOTE — Telephone Encounter (Signed)
Dr. Abbey Chatters has many openings starting 4/18 and in May.

## 2020-08-22 ENCOUNTER — Other Ambulatory Visit: Payer: Self-pay

## 2020-08-22 ENCOUNTER — Other Ambulatory Visit: Payer: Self-pay | Admitting: Gastroenterology

## 2020-08-22 ENCOUNTER — Encounter (HOSPITAL_COMMUNITY): Admission: RE | Payer: Self-pay | Source: Home / Self Care

## 2020-08-22 ENCOUNTER — Ambulatory Visit (HOSPITAL_COMMUNITY): Admission: RE | Admit: 2020-08-22 | Payer: BC Managed Care – PPO | Source: Home / Self Care | Admitting: General Surgery

## 2020-08-22 SURGERY — APPENDECTOMY, LAPAROSCOPIC
Anesthesia: General

## 2020-08-22 NOTE — Telephone Encounter (Signed)
RX done. 

## 2020-08-22 NOTE — Telephone Encounter (Signed)
Spoke to pt, TCS/EGD scheduled for 08/29/20--PM with Dr. Abbey Chatters. COVID test 08/25/20 at 10:45am. Orders entered.  [name], she is requesting Miralax prep. States Miralax prep is the only prep she has been able to tolerate in the past. She was cleaned out for previous procedure and had no problems. Is it ok for her to do Miralax prep?  PA for TCS/EGD submitted via [employer] website. Order ID for TCS: [id number], valid 08/22/20-10/20/20. Order ID for EGD: [id number], valid 08/22/20-10/20/20.

## 2020-08-22 NOTE — Telephone Encounter (Signed)
Called and informed pt rx was sent

## 2020-08-22 NOTE — Telephone Encounter (Signed)
She is having diarrhea so I don't anticipate any poor prep situations. Let's ask her to take dulcolax 10mg  po daily for two days before her prep starts. Also needs the dulcolax that goes with the miralax prep as well.

## 2020-08-22 NOTE — Telephone Encounter (Signed)
Called and informed pt it's ok for her to do Miralax prep. She will pick up instructions. Informed her office will be closed 08/26/20.  Magda Paganini, I done refill request for Zofran this morning. Pt asked if it has been sent in. She is requesting refill be sent today. She has took all the Zofran that was sent in 08/19/20.

## 2020-08-24 ENCOUNTER — Other Ambulatory Visit: Payer: Self-pay

## 2020-08-24 ENCOUNTER — Ambulatory Visit
Payer: BC Managed Care – PPO | Attending: Student in an Organized Health Care Education/Training Program | Admitting: Student in an Organized Health Care Education/Training Program

## 2020-08-24 ENCOUNTER — Encounter: Payer: Self-pay | Admitting: Student in an Organized Health Care Education/Training Program

## 2020-08-24 VITALS — BP 153/87 | HR 93 | Temp 96.7°F | Resp 18 | Ht 61.0 in | Wt 115.0 lb

## 2020-08-24 DIAGNOSIS — M5136 Other intervertebral disc degeneration, lumbar region: Secondary | ICD-10-CM | POA: Insufficient documentation

## 2020-08-24 DIAGNOSIS — G5703 Lesion of sciatic nerve, bilateral lower limbs: Secondary | ICD-10-CM | POA: Insufficient documentation

## 2020-08-24 DIAGNOSIS — M47818 Spondylosis without myelopathy or radiculopathy, sacral and sacrococcygeal region: Secondary | ICD-10-CM | POA: Diagnosis not present

## 2020-08-24 DIAGNOSIS — M47816 Spondylosis without myelopathy or radiculopathy, lumbar region: Secondary | ICD-10-CM | POA: Diagnosis present

## 2020-08-24 DIAGNOSIS — G894 Chronic pain syndrome: Secondary | ICD-10-CM | POA: Insufficient documentation

## 2020-08-24 DIAGNOSIS — Z79891 Long term (current) use of opiate analgesic: Secondary | ICD-10-CM | POA: Insufficient documentation

## 2020-08-24 DIAGNOSIS — M7918 Myalgia, other site: Secondary | ICD-10-CM | POA: Diagnosis present

## 2020-08-24 DIAGNOSIS — M533 Sacrococcygeal disorders, not elsewhere classified: Secondary | ICD-10-CM | POA: Insufficient documentation

## 2020-08-24 MED ORDER — OXYCODONE-ACETAMINOPHEN 5-325 MG PO TABS: 1.0000 | ORAL_TABLET | Freq: Three times a day (TID) | ORAL | 0 refills | Status: AC | PRN

## 2020-08-24 NOTE — Patient Instructions (Signed)

## 2020-08-24 NOTE — Progress Notes (Signed)
Nursing Pain Medication Assessment:  Safety precautions to be maintained throughout the outpatient stay will include: orient to surroundings, keep bed in low position, maintain call bell within reach at all times, provide assistance with transfer out of bed and ambulation.  Medication Inspection Compliance: [name] did not comply with our request to bring her pills to be counted. She was reminded that bringing the medication bottles, even when empty, is a requirement.  Medication: None brought in. Pill/Patch Count: None available to be counted. Bottle Appearance: No container available. Did not bring bottle(s) to appointment. Filled Date: N/A Last Medication intake:  Today

## 2020-08-24 NOTE — Progress Notes (Signed)
PROVIDER NOTE: Information contained herein reflects review and annotations entered in association with encounter. Interpretation of such information and data should be left to medically-trained personnel. Information provided to patient can be located elsewhere in the medical record under "Patient Instructions". Document created using STT-dictation technology, any transcriptional errors that may result from process are unintentional.    Patient: [name]  Service Category: E/M  Provider: Gillis Santa, MD  DOB: 07-16-1970  DOS: 08/24/2020  Specialty: Interventional Pain Management  MRN: [id number]  Setting: Ambulatory outpatient  PCP: Vidal Schwalbe, MD  Type: Established Patient    Referring Provider: Vidal Schwalbe, MD  Location: Office  Delivery: Face-to-face     HPI  [name], a 50 y.o. year old female, is here today because of her Chronic pain syndrome [G89.4]. [name] primary complain today is Back Pain (low) Last encounter: My last encounter with her was on 05/02/2020. Pertinent problems: [name] has Lumbar radiculopathy; Lumbar degenerative disc disease; Lumbar spondylosis; Chronic low back pain with bilateral sciatica; Chronic pain syndrome; Lumbar sprain; Chronic migraine without aura without status migrainosus, not intractable; SI joint arthritis; and Long term prescription opiate use on their pertinent problem list. Pain Assessment: Severity of Chronic pain is reported as a 7 /10. Location: Back Lower/legs. Onset: More than a month ago. Quality: Pressure,Pounding,Sharp,Constant. Timing: Constant. Modifying factor(s): medications, ice, rest. Vitals:  height is '5\' 1"'  (1.549 m) and weight is 115 lb (52.2 kg). Her temporal temperature is 96.7 F (35.9 C) (abnormal). Her blood pressure is 153/87 (abnormal) and her pulse is 93. Her respiration is 18 and oxygen saturation is 99%.   Reason for encounter: medication management.  And worsening bilateral SI joint pain.  Patient  presents today for medication management.  Since last clinic visit with me, she had an ED visit for right lower quadrant pain.  This is thought to be due to a Crohn's flare.  She is scheduled for a upper endoscopy and colonoscopy on Monday in [place]. Of note, patient states that she received 100% pain relief in her bilateral SI joint, buttock pain for 2-1/2 to 3 months after her SI joint injection that was performed on 05/02/2020.  She is now endorsing return of buttock and SI joint pain.  She is requesting a repeat SI joint injection.  We also discussed performing a diagnostic piriformis injection for possible piriformis syndrome that could be contributing to her pain as well.  We discussed potentially weaning her Percocet from quantity 75 to quantity 60 especially after we do her bilateral SI joint and piriformis injection.  Risks and benefits reviewed and patient would like to proceed.  Pharmacotherapy Assessment   07/30/2020  05/31/2020   1  Oxycodone-Acetaminophen 5-325  75.00  30  Un Pha  [id number]  Nor ([id number])  0/0  18.75 MME  Private Pay  [place]     Analgesic:  Monitoring: [place] PMP: PDMP reviewed during this encounter.       Pharmacotherapy: No side-effects or adverse reactions reported. Compliance: No problems identified. Effectiveness: Clinically acceptable.  Hart Rochester, RN  08/24/2020  2:00 PM  Sign when Signing Visit Nursing Pain Medication Assessment:  Safety precautions to be maintained throughout the outpatient stay will include: orient to surroundings, keep bed in low position, maintain call bell within reach at all times, provide assistance with transfer out of bed and ambulation.  Medication Inspection Compliance: [name] did not comply with our request to bring her pills to be counted. She  was reminded that bringing the medication bottles, even when empty, is a requirement.  Medication: None brought in. Pill/Patch Count: None available to be counted. Bottle  Appearance: No container available. Did not bring bottle(s) to appointment. Filled Date: N/A Last Medication intake:  Today    UDS:  Summary  Date Value Ref Range Status  01/07/2020 Note  Final    Comment:    ==================================================================== [employer] Select 13 (MW) ==================================================================== Test                             Result       Flag       Units  Drug Present and Declared for Prescription Verification   Oxazepam                       >3125        EXPECTED   ng/mg creat   Temazepam                      >3125        EXPECTED   ng/mg creat    Oxazepam and temazepam are expected metabolites of diazepam.    Oxazepam is also an expected metabolite of other benzodiazepine    drugs, including chlordiazepoxide, prazepam, clorazepate, halazepam,    and temazepam.  Oxazepam and temazepam are available as scheduled    prescription medications.    7-aminoclonazepam              1298         EXPECTED   ng/mg creat    7-aminoclonazepam is an expected metabolite of clonazepam. Source of    clonazepam is a scheduled prescription medication.    Oxycodone                      >08144       EXPECTED   ng/mg creat   Oxymorphone                    13241        EXPECTED   ng/mg creat   Noroxycodone                   >81856       EXPECTED   ng/mg creat   Noroxymorphone                 6861         EXPECTED   ng/mg creat    Sources of oxycodone are scheduled prescription medications.    Oxymorphone, noroxycodone, and noroxymorphone are expected    metabolites of oxycodone. Oxymorphone is also available as a    scheduled prescription medication.  Drug Absent but Declared for Prescription Verification   Hydrocodone                    Not Detected UNEXPECTED ng/mg creat   Buprenorphine                  Not Detected UNEXPECTED ng/mg creat    Low dose buprenorphine is not always detected even when used as     directed.  ==================================================================== Test                      Result    Flag   Units      Ref Range   Creatinine  64               mg/dL      >=20 ==================================================================== Declared Medications:  The flagging and interpretation on this report are based on the  following declared medications.  Unexpected results may arise from  inaccuracies in the declared medications.   **Note: The testing scope of this panel includes these medications:   Clonazepam (Klonopin)  Hydrocodone (Hydromet)  Oxycodone (Percocet)  Temazepam (Restoril)   **Note: The testing scope of this panel does not include small to  moderate amounts of these reported medications:   Buprenorphine   **Note: The testing scope of this panel does not include the  following reported medications:   Acetaminophen (Percocet)  Azelastine (Astelin)  Cefaclor  Escitalopram (Lexapro)  Famotidine (Pepcid)  Gabapentin  Homatropine (Hydromet)  Hydroxyzine (Atarax)  Ibuprofen  Levofloxacin  Metoprolol (Toprol)  Naproxen  Nitrofurantoin (Macrobid)  Omeprazole (Prilosec)  Ondansetron (Zofran)  Penicillin ==================================================================== For clinical consultation, please call [phone]. ====================================================================      ROS  Constitutional: Denies any fever or chills Gastrointestinal: No reported hemesis, hematochezia, vomiting, or acute GI distress Musculoskeletal: Bilateral SI joint, buttock pain Neurological: No reported episodes of acute onset apraxia, aphasia, dysarthria, agnosia, amnesia, paralysis, loss of coordination, or loss of consciousness  Medication Review  clonazePAM, fluticasone, metoprolol succinate, ondansetron, oxyCODONE-acetaminophen, predniSONE, and temazepam  History Review  Allergy: Ms. Khurana is allergic to  aripiprazole, buprenorphine, cefaclor, gabapentin, lisinopril, seroquel [quetiapine], doxycycline, ibuprofen, levofloxacin, macrobid [nitrofurantoin monohyd macro], naproxen, and penicillins. Drug: Ms. Hofacker  reports no history of drug use. Alcohol:  reports no history of alcohol use. Tobacco:  reports that she has been smoking cigarettes. She has a 20.00 pack-year smoking history. She has never used smokeless tobacco. Social: Ms. Turner  reports that she has been smoking cigarettes. She has a 20.00 pack-year smoking history. She has never used smokeless tobacco. She reports that she does not drink alcohol and does not use drugs. Medical:  has a past medical history of Chronic back pain, Colitis, Hypertension, Migraine, and Plumbism. Surgical: Ms. Yaden  has a past surgical history that includes Abdominal hysterectomy; Cholecystectomy; Hand surgery; Back surgery; Colonoscopy with esophagogastroduodenoscopy (egd) (07/2009); and Esophagogastroduodenoscopy (06/2007). Family: family history includes Cancer in an other family member; Diabetes in an other family member; Seizures in an other family member; Stroke in an other family member.  Laboratory Chemistry Profile   Renal Lab Results  Component Value Date   BUN 16 08/18/2020   CREATININE 1.06 (H) 08/18/2020   GFRAA >60 01/17/2020   GFRNONAA >60 08/18/2020     Hepatic Lab Results  Component Value Date   AST 22 08/18/2020   ALT 22 08/18/2020   ALBUMIN 3.7 08/18/2020   ALKPHOS 116 08/18/2020   LIPASE 21 08/18/2020     Electrolytes Lab Results  Component Value Date   NA 138 08/18/2020   K 3.7 08/18/2020   CL 105 08/18/2020   CALCIUM 9.0 08/18/2020     Bone No results found for: VD25OH, VD125OH2TOT, [id number], [id number], 25OHVITD1, 25OHVITD2, 25OHVITD3, TESTOFREE, TESTOSTERONE   Inflammation (CRP: Acute Phase) (ESR: Chronic Phase) No results found for: CRP, ESRSEDRATE, LATICACIDVEN     Note: Above Lab results reviewed.  Recent  Imaging Review  CT ABDOMEN PELVIS W CONTRAST CLINICAL DATA:  Abdominal pain, primarily right cited  EXAM: CT ABDOMEN AND PELVIS WITH CONTRAST  TECHNIQUE: Multidetector CT imaging of the abdomen and pelvis was performed using the standard protocol following bolus administration of intravenous  contrast.  CONTRAST:  36m OMNIPAQUE IOHEXOL 300 MG/ML  SOLN  COMPARISON:  CT abdomen and pelvis July 14, 2020; abdominal MR August 03, 2020  FINDINGS: Lower chest: Lung bases are clear.  Hepatobiliary: No focal liver lesions are appreciable. The gallbladder is absent. There is again noted intrahepatic biliary duct dilatation. The common hepatic duct remains prominent, measuring 1.2 cm in diameter. There is gradual tapering of the common bile duct. No mass or calculus is appreciable in the biliary ductal system.  Pancreas: No pancreatic mass or inflammatory focus is appreciable. Pancreatic duct is upper normal in size, stable.  Spleen: No splenic lesions are evident.  Adrenals/Urinary Tract: Adrenals bilaterally appear normal. 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 Note that this mass was recently evaluated on MR. There is a simple appearing cyst in the posterior upper left kidney measuring 1.2 x 1.0 cm. Tiny nearby cyst also noted. 7 x 6 mm cyst noted lower pole left kidney. No hydronephrosis evident on either side. No evident renal or ureteral calculus on either side. Urinary bladder wall thickness is within normal limits.  Stomach/Bowel: There is wall thickening throughout the sigmoid colon and upper rectum. Intermittent wall thickening is noted throughout portions of the ascending colon and transverse colon and to a slightly lesser extent in the descending colon. There is wall thickening at the junction of the terminal ileum and cecum. Ileum otherwise appears unremarkable. No appreciable fistula noted.  There is no evident bowel obstruction. Most loops of small bowel are fluid filled.  There is wall thickening in portions of the gastric antrum and pylorus. Duodenum appears essentially unremarkable by CT.  The appendix has a maximum diameter of 6 mm, compared 8 mm on prior study. No appendiceal/periappendiceal region inflammation is evident on this study.  There is no free air or portal venous air.  Vascular/Lymphatic: No abdominal aortic aneurysm. There is aortic atherosclerosis. Major venous structures appear patent. No evident adenopathy in the abdomen or pelvis.  Reproductive: The uterus is absent. No adnexal masses are appreciable.  Other: No ascites or abscess evident in the abdomen or pelvis. No omental lesions evident. Calcification in the lateral upper pelvic wall soft tissues may represent residua of previous trauma.  Musculoskeletal: No blastic or lytic bone lesions. No intramuscular lesions are evident.  IMPRESSION: 1. Wall thickening involving much of the colon with skip type lesions noted in the ascending, transverse, and descending colon with more uniform wall thickening throughout the sigmoid colon and upper rectum. There is wall thickening at the terminal ileum-cecum junction. This appearance raises question of potential underlying Crohn's disease. A somewhat atypical presentation of infectious colitis is possible. No fistula evident. No bowel pneumatosis. Note that there is aortic atherosclerosis, but major mesenteric vessels appear patent.  2. Wall thickening in portions of the distal stomach consistent with gastritis. Note that Crohn's disease could present in this manner.  3. Appendix appears within normal limits. Appendix slightly smaller compared to recent prior study.  4. Gallbladder absent. Biliary duct dilatation without obstructing lesion again noted.  5. Septated cystic mass arising from the upper pole left kidney appear stable. This mass  was evaluated with MR recently.  6. No renal or ureteral calculi. No hydronephrosis. Urinary bladder wall thickness within normal limits.  7.  Uterus absent.  No adnexal masses evident.  8.  Aortic Atherosclerosis (ICD10-I70.0).  7.  Uterus absent.  Electronically Signed   By: WGwyndolyn Saxon  Jasmine December III M.D.   On: 08/18/2020 09:35 Note: Reviewed        Physical Exam  General appearance: Well nourished, well developed, and well hydrated. In no apparent acute distress Mental status: Alert, oriented x 3 (person, place, & time)       Respiratory: No evidence of acute respiratory distress Eyes: PERLA Vitals: BP (!) 153/87   Pulse 93   Temp (!) 96.7 F (35.9 C) (Temporal)   Resp 18   Ht '5\' 1"'  (1.549 m)   Wt 115 lb (52.2 kg)   SpO2 99%   BMI 21.73 kg/m  BMI: Estimated body mass index is 21.73 kg/m as calculated from the following:   Height as of this encounter: '5\' 1"'  (1.549 m).   Weight as of this encounter: 115 lb (52.2 kg). Ideal: Ideal body weight: 47.8 kg (105 lb 6.1 oz) Adjusted ideal body weight: 49.5 kg (109 lb 3.7 oz)  Lumbar Spine Area Exam  Skin & Axial Inspection: No masses, redness, or swelling Alignment: Symmetrical Functional ROM: Pain restricted ROM       Stability: No instability detected Muscle Tone/Strength: Functionally intact. No obvious neuro-muscular anomalies detected. Sensory (Neurological): Musculoskeletal pain pattern  Provocative Tests: Hyperextension/rotation test: deferred today       Lumbar quadrant test (Kemp's test): deferred today       Lateral bending test: deferred today       Patrick's Maneuver: (+) for bilateral S-I arthralgia             FABER* test: (+) for bilateral S-I arthralgia             S-I anterior distraction/compression test: (+)   S-I arthralgia/arthropathy S-I lateral compression test: deferred today         S-I Thigh-thrust test: deferred today         S-I Gaenslen's test: deferred today         *(Flexion, ABduction and  External Rotation)  Gait & Posture Assessment  Ambulation: Unassisted Gait: Antalgic Posture: Difficulty standing up straight, due to pain   Lower Extremity Exam    Side: Right lower extremity  Side: Left lower extremity  Stability: No instability observed          Stability: No instability observed          Skin & Extremity Inspection: Skin color, temperature, and hair growth are WNL. No peripheral edema or cyanosis. No masses, redness, swelling, asymmetry, or associated skin lesions. No contractures.  Skin & Extremity Inspection: Skin color, temperature, and hair growth are WNL. No peripheral edema or cyanosis. No masses, redness, swelling, asymmetry, or associated skin lesions. No contractures.  Functional ROM: Pain restricted ROM for hip joint          Functional ROM: Pain restricted ROM                  Muscle Tone/Strength: Functionally intact. No obvious neuro-muscular anomalies detected.  Muscle Tone/Strength: Functionally intact. No obvious neuro-muscular anomalies detected.  Sensory (Neurological): Referred pain pattern from SI joint        Sensory (Neurological): Referred pain pattern from SI joint        DTR: Patellar: deferred today Achilles: deferred today Plantar: deferred today  DTR: Patellar: deferred today Achilles: deferred today Plantar: deferred today  Palpation: No palpable anomalies  Palpation: No palpable anomalies    Assessment   Status Diagnosis  Controlled Having a Flare-up Having a Flare-up 1. Chronic pain syndrome   2. Sacroiliac joint  pain   3. SI joint arthritis   4. Long term prescription opiate use   5. Lumbar spondylosis   6. Myofascial pain   7. Lumbar degenerative disc disease   8. Piriformis syndrome of both sides       Plan of Care   Ms. [place] HUBERTY has a current medication list which includes the following long-term medication(s): clonazepam and fluticasone.  Pharmacotherapy (Medications Ordered): Meds ordered this  encounter  Medications  . oxyCODONE-acetaminophen (PERCOCET/ROXICET) 5-325 MG tablet    Sig: Take 1 tablet by mouth every 8 (eight) hours as needed for severe pain.    Dispense:  75 tablet    Refill:  0    For chronic pain syndrome.  Each prescription to last at least 30 days.  Marland Kitchen oxyCODONE-acetaminophen (PERCOCET/ROXICET) 5-325 MG tablet    Sig: Take 1 tablet by mouth every 8 (eight) hours as needed for severe pain.    Dispense:  75 tablet    Refill:  0    For chronic pain syndrome.  Each prescription to last at least 30 days.  Marland Kitchen oxyCODONE-acetaminophen (PERCOCET/ROXICET) 5-325 MG tablet    Sig: Take 1 tablet by mouth every 8 (eight) hours as needed for severe pain.    Dispense:  75 tablet    Refill:  0    For chronic pain syndrome.  Each prescription to last at least 30 days.   Orders:  Orders Placed This Encounter  Procedures  . SACROILIAC JOINT INJECTION    Standing Status:   Future    Standing Expiration Date:   09/23/2020    Scheduling Instructions:     Side: Bilateral     Sedation: Patient's choice.     Timeframe: ASAP    Order Specific Question:   Where will this procedure be performed?    Answer:   [facility]  . TRIGGER POINT INJECTION    Area: Buttocks region (gluteal area) Indications: Piriformis muscle pain;  Bilateral piriformis-syndrome; piriformis muscle spasms (Q82.500). CPT code: 20552    Scheduling Instructions:     Type: Myoneural block (TPI) of piriformis muscle.     Side:  Bilateral     Sedation: Patient's choice.     Timeframe: Today    Order Specific Question:   Where will this procedure be performed?    Answer:   [facility]  . ToxASSURE Select 13 (MW), Urine    Volume: 30 ml(s). Minimum 3 ml of urine is needed. Document temperature of fresh sample. Indications: Long term (current) use of opiate analgesic (B70.488)    Order Specific Question:   Release to patient    Answer:   Immediate   Follow-up plan:   Return in about 4  weeks (around 09/21/2020) for B/L SI-J + Piriformis with sedation.   Recent Visits Date Type Provider Dept  05/31/20 Telemedicine Gillis Santa, MD [facility]  Showing recent visits within past 90 days and meeting all other requirements Today's Visits Date Type Provider Dept  08/24/20 Office Visit Gillis Santa, MD [facility]  Showing today's visits and meeting all other requirements Future Appointments No visits were found meeting these conditions. Showing future appointments within next 90 days and meeting all other requirements  I discussed the assessment and treatment plan with the patient. The patient was provided an opportunity to ask questions and all were answered. The patient agreed with the plan and demonstrated an understanding of the instructions.  Patient advised to call back or  seek an in-person evaluation if the symptoms or condition worsens.  Duration of encounter: 35 minutes.  Note by: Gillis Santa, MD Date: 08/24/2020; Time: 2:25 PM

## 2020-08-25 ENCOUNTER — Other Ambulatory Visit (HOSPITAL_COMMUNITY)
Admission: RE | Admit: 2020-08-25 | Discharge: 2020-08-25 | Disposition: A | Discharge status: Home / Self Care | Payer: BC Managed Care – PPO | Source: Ambulatory Visit | Attending: Internal Medicine | Admitting: Internal Medicine

## 2020-08-25 ENCOUNTER — Other Ambulatory Visit: Payer: Self-pay

## 2020-08-25 DIAGNOSIS — Z01812 Encounter for preprocedural laboratory examination: Secondary | ICD-10-CM | POA: Insufficient documentation

## 2020-08-25 DIAGNOSIS — Z20822 Contact with and (suspected) exposure to covid-19: Secondary | ICD-10-CM | POA: Insufficient documentation

## 2020-08-25 LAB — SARS CORONAVIRUS 2 (TAT 6-24 HRS): SARS Coronavirus 2: NEGATIVE

## 2020-08-26 ENCOUNTER — Other Ambulatory Visit: Payer: Self-pay | Admitting: Gastroenterology

## 2020-08-29 ENCOUNTER — Telehealth: Payer: Self-pay | Admitting: Internal Medicine

## 2020-08-29 NOTE — Telephone Encounter (Signed)
PATIENT CALLED AND SAID SHE WAS NOT TOLD UNTIL TODAY WHAT HER PROCEDURE TIME WAS AND SHE NEEDS TO RESCHEDULE.

## 2020-08-29 NOTE — Telephone Encounter (Signed)
Called pt, states she didn't receive a call for her arrival time for procedure today so she didn't prep. TCS/EGD rescheduled to 09/12/20 at 2:45pm. COVID test 09/09/20 at 11:00am. Endo scheduler informed. Appt letter mailed with new procedure instructions.

## 2020-08-30 LAB — TOXASSURE SELECT 13 (MW), URINE

## 2020-09-08 ENCOUNTER — Telehealth: Payer: Self-pay | Admitting: *Deleted

## 2020-09-08 NOTE — Telephone Encounter (Signed)
Received call from endo unable to reach patient regarding arrival time for procedure on Monday. Needs to arrive 1:15pm  Called pt, she was not able to hear me. Tried to call again she still was not able to hear me

## 2020-09-08 NOTE — Telephone Encounter (Signed)
Called pt at mobile # and not available, called home# line rang numerous times no answer

## 2020-09-09 ENCOUNTER — Other Ambulatory Visit (HOSPITAL_COMMUNITY)
Admission: RE | Admit: 2020-09-09 | Discharge: 2020-09-09 | Disposition: A | Discharge status: Home / Self Care | Payer: BC Managed Care – PPO | Source: Ambulatory Visit | Attending: Internal Medicine | Admitting: Internal Medicine

## 2020-09-09 ENCOUNTER — Encounter (HOSPITAL_COMMUNITY): Payer: Self-pay | Admitting: Anesthesiology

## 2020-09-09 ENCOUNTER — Other Ambulatory Visit: Payer: Self-pay

## 2020-09-09 DIAGNOSIS — Z01812 Encounter for preprocedural laboratory examination: Secondary | ICD-10-CM | POA: Insufficient documentation

## 2020-09-09 DIAGNOSIS — Z20822 Contact with and (suspected) exposure to covid-19: Secondary | ICD-10-CM | POA: Diagnosis not present

## 2020-09-09 NOTE — Telephone Encounter (Signed)
Called pt and she is aware  ?

## 2020-09-09 NOTE — Progress Notes (Signed)
Patient forgot her appointment this morning. She's coming in this afternoon.

## 2020-09-10 LAB — SARS CORONAVIRUS 2 (TAT 6-24 HRS): SARS Coronavirus 2: NEGATIVE

## 2020-09-12 ENCOUNTER — Telehealth: Payer: Self-pay | Admitting: Internal Medicine

## 2020-09-12 ENCOUNTER — Encounter (HOSPITAL_COMMUNITY)
Admission: RE | Disposition: A | Discharge status: Home / Self Care | Payer: Self-pay | Source: Home / Self Care | Attending: Internal Medicine

## 2020-09-12 ENCOUNTER — Ambulatory Visit (HOSPITAL_COMMUNITY)
Admission: RE | Admit: 2020-09-12 | Discharge: 2020-09-12 | Disposition: A | Discharge status: Home / Self Care | Payer: BC Managed Care – PPO | Attending: Internal Medicine | Admitting: Internal Medicine

## 2020-09-12 SURGERY — COLONOSCOPY WITH PROPOFOL
Anesthesia: Monitor Anesthesia Care

## 2020-09-12 MED ORDER — PEG 3350-KCL-NA BICARB-NACL 420 G PO SOLR: ORAL | 0 refills | Status: DC

## 2020-09-12 MED ORDER — ONDANSETRON HCL 4 MG PO TABS: ORAL_TABLET | ORAL | 0 refills | Status: DC

## 2020-09-12 NOTE — Telephone Encounter (Signed)
Okay to send in Zofran 4 mg every 6 hours as needed for nausea, 10 tablets total.  Thank you

## 2020-09-12 NOTE — Telephone Encounter (Signed)
Noted! rx sent in  

## 2020-09-12 NOTE — Addendum Note (Signed)
Addended by: Cheron Every on: 09/12/2020 04:30 PM   Modules accepted: Orders

## 2020-09-12 NOTE — Telephone Encounter (Signed)
Good afternoon, patient presented for EGD and colonoscopy today.  She states she did the entire MiraLAX prep and has yet to have a bowel movement.  She also notes accidentally eating soup this morning.  Can we reschedule her for EGD and colonoscopy ASAP?  Please add to the cancellation list if need be.  Can we also give her a 2-day prep this time instead and may be something different than MiraLAX?

## 2020-09-12 NOTE — Addendum Note (Signed)
Addended by: Cheron Every on: 09/12/2020 02:37 PM   Modules accepted: Orders

## 2020-09-12 NOTE — Telephone Encounter (Addendum)
Called pt. Offered this Friday 5/6 as Dr. Abbey Chatters had a cancellation but declined. She scheduled for 5/23. Aware will mail instructions with covid test appt. Confirmed address. Confirmed pharmacy. Made her aware to follow prep instructions step-by-step. Also she is requesting Rx for Zofran to be sent in since she did not get procedure done today? Please advise Dr. Abbey Chatters thanks  PA for TCS/EGD submitted via [employer] website. Order ID for TCS: [id number], valid 08/22/20-10/20/20. Order ID for EGD: [id number], valid 08/22/20-10/20/20.

## 2020-09-30 ENCOUNTER — Other Ambulatory Visit (HOSPITAL_COMMUNITY)
Admit: 2020-09-30 | Discharge: 2020-09-30 | Disposition: A | Discharge status: Home / Self Care | Payer: BC Managed Care – PPO | Attending: Internal Medicine | Admitting: Internal Medicine

## 2020-09-30 ENCOUNTER — Telehealth: Payer: Self-pay

## 2020-09-30 NOTE — Telephone Encounter (Signed)
[name] at covid testing site called office, pt hasn't showed up for covid test appt this morning. She was unable to reach pt.   Tried to call pt, unable to leave VM d/t mailbox not set-up. Called Lattie Haw back and informed her I was unable to reach pt. If she doesn't have covid test today her procedure for 10/03/20 will be cancelled.

## 2020-10-02 ENCOUNTER — Encounter (HOSPITAL_COMMUNITY): Payer: Self-pay | Admitting: Anesthesiology

## 2020-10-03 ENCOUNTER — Encounter (HOSPITAL_COMMUNITY): Admission: RE | Payer: Self-pay | Source: Home / Self Care

## 2020-10-03 ENCOUNTER — Ambulatory Visit (HOSPITAL_COMMUNITY): Admission: RE | Admit: 2020-10-03 | Payer: BC Managed Care – PPO | Source: Home / Self Care

## 2020-10-03 SURGERY — COLONOSCOPY WITH PROPOFOL
Anesthesia: Monitor Anesthesia Care

## 2020-10-03 NOTE — Telephone Encounter (Signed)
Procedure for today was cancelled d/t no show for covid test and unable to reach pt.  FYI to Neil Crouch PA and Dr. Abbey Chatters.

## 2020-10-03 NOTE — Telephone Encounter (Signed)
Noted  

## 2020-10-03 NOTE — Telephone Encounter (Signed)
Melanie at endo called office to let us know procedure was cancelled. She had also tried to call pt and was unable to reach her.

## 2020-10-17 ENCOUNTER — Encounter: Payer: Self-pay | Admitting: Student in an Organized Health Care Education/Training Program

## 2020-10-18 ENCOUNTER — Other Ambulatory Visit: Payer: Self-pay

## 2020-10-18 ENCOUNTER — Ambulatory Visit
Payer: BC Managed Care – PPO | Attending: Student in an Organized Health Care Education/Training Program | Admitting: Student in an Organized Health Care Education/Training Program

## 2020-10-18 ENCOUNTER — Encounter: Payer: Self-pay | Admitting: Student in an Organized Health Care Education/Training Program

## 2020-10-18 DIAGNOSIS — G5703 Lesion of sciatic nerve, bilateral lower limbs: Secondary | ICD-10-CM

## 2020-10-18 DIAGNOSIS — M533 Sacrococcygeal disorders, not elsewhere classified: Secondary | ICD-10-CM

## 2020-10-18 DIAGNOSIS — M47818 Spondylosis without myelopathy or radiculopathy, sacral and sacrococcygeal region: Secondary | ICD-10-CM | POA: Diagnosis not present

## 2020-10-18 NOTE — Progress Notes (Signed)
Patient: [name]  Service Category: E/M  Provider: Gillis Santa, MD  DOB: 1971-01-21  DOS: 10/18/2020  Location: Office  MRN: [id number]  Setting: Ambulatory outpatient  Referring Provider: Vidal Schwalbe, MD  Type: Established Patient  Specialty: Interventional Pain Management  PCP: Vidal Schwalbe, MD  Location: Home  Delivery: TeleHealth     Virtual Encounter - Pain Management PROVIDER NOTE: Information contained herein reflects review and annotations entered in association with encounter. Interpretation of such information and data should be left to medically-trained personnel. Information provided to patient can be located elsewhere in the medical record under "Patient Instructions". Document created using STT-dictation technology, any transcriptional errors that may result from process are unintentional.    Contact & Pharmacy Preferred: [place]: [phone] (home) Mobile: [phone] (mobile) E-mail: No e-mail address on record  [place], [place] - [address] [place] [address] Phone: [phone] Fax: [phone]   Pre-screening  Ms. Martian offered "in-person" vs "virtual" encounter. She indicated preferring virtual for this encounter.   Reason COVID-19*  Social distancing based on CDC and AMA recommendations.   I contacted [name] on 10/18/2020 via video conference.      I clearly identified myself as Monica Santa, MD. I verified that I was speaking with the correct person using two identifiers (Name: LESSA HUGE, and date of birth: 07/22/1970).  Consent I sought verbal advanced consent from [name] for virtual visit interactions. I informed Ms. Yarberry of possible security and privacy concerns, risks, and limitations associated with providing "not-in-person" medical evaluation and management services. I also informed Ms. Chanda of the availability of "in-person" appointments. Finally, I informed her  that there would be a charge for the virtual visit and that she could be  personally, fully or partially, financially responsible for it. Ms. Dutson expressed understanding and agreed to proceed.   Historic Elements   [name] is a 50 y.o. year old, female patient evaluated today after our last contact on 08/24/2020. [name]  has a past medical history of Chronic back pain, Colitis, Hypertension, Migraine, and Plumbism. She also  has a past surgical history that includes Abdominal hysterectomy; Cholecystectomy; Hand surgery; Back surgery; Colonoscopy with esophagogastroduodenoscopy (egd) (07/2009); and Esophagogastroduodenoscopy (06/2007). [name] has a current medication list which includes the following prescription(s): clonazepam, famotidine, hydroxyzine, metoprolol succinate, ondansetron, oxycodone-acetaminophen, [START ON 10/28/2020] oxycodone-acetaminophen, polyethylene glycol-electrolytes, promethazine, temazepam, fluticasone, and prednisone. She  reports that she has been smoking cigarettes. She has a 20.00 pack-year smoking history. She has never used smokeless tobacco. She reports that she does not drink alcohol and does not use drugs. [name] is allergic to aripiprazole, buprenorphine, cefaclor, gabapentin, lisinopril, seroquel [quetiapine], doxycycline, ibuprofen, levofloxacin, macrobid [nitrofurantoin monohyd macro], naproxen, and penicillins.   HPI  Today, she is being contacted for worsening of previously known (established) problem   Patient is complaining of worsening bilateral SI joint pain and piriformis pain.  She states that she is in need of a bilateral SI joint injection which she has performed in the past.  Of note, patient states that she received 100% pain relief in her bilateral SI joint, buttock pain for 2-1/2 to 3 months after her SI joint injection that was performed on 05/02/2020.  She is now endorsing return of buttock and SI joint pain.  She is requesting a  repeat SI joint injection.  We also discussed performing a diagnostic piriformis injection for possible piriformis syndrome that  could be contributing to her pain as well.  Patient states that her previous SI joint injections have been very helpful in helping to reduce her pain and helping to increase her function.  We had to escalate her opioid analgesics given increased SI joint pain which I hope to wean, de-escalate after she has completed her SI joint injections.   Laboratory Chemistry Profile   Renal Lab Results  Component Value Date   BUN 16 08/18/2020   CREATININE 1.06 (H) 08/18/2020   GFRAA >60 01/17/2020   GFRNONAA >60 08/18/2020     Hepatic Lab Results  Component Value Date   AST 22 08/18/2020   ALT 22 08/18/2020   ALBUMIN 3.7 08/18/2020   ALKPHOS 116 08/18/2020   LIPASE 21 08/18/2020     Electrolytes Lab Results  Component Value Date   NA 138 08/18/2020   K 3.7 08/18/2020   CL 105 08/18/2020   CALCIUM 9.0 08/18/2020     Bone No results found for: VD25OH, VD125OH2TOT, [id number], RS8546EV0, 25OHVITD1, 25OHVITD2, 25OHVITD3, TESTOFREE, TESTOSTERONE   Inflammation (CRP: Acute Phase) (ESR: Chronic Phase) No results found for: CRP, ESRSEDRATE, LATICACIDVEN     Note: Above Lab results reviewed.  Imaging  CT ABDOMEN PELVIS W CONTRAST CLINICAL DATA:  Abdominal pain, primarily right cited  EXAM: CT ABDOMEN AND PELVIS WITH CONTRAST  TECHNIQUE: Multidetector CT imaging of the abdomen and pelvis was performed using the standard protocol following bolus administration of intravenous contrast.  CONTRAST:  61mL OMNIPAQUE IOHEXOL 300 MG/ML  SOLN  COMPARISON:  CT abdomen and pelvis July 14, 2020; abdominal MR August 03, 2020  FINDINGS: Lower chest: Lung bases are clear.  Hepatobiliary: No focal liver lesions are appreciable. The gallbladder is absent. There is again noted intrahepatic biliary duct dilatation. The common hepatic duct remains prominent, measuring  1.2 cm in diameter. There is gradual tapering of the common bile duct. No mass or calculus is appreciable in the biliary ductal system.  Pancreas: No pancreatic mass or inflammatory focus is appreciable. Pancreatic duct is upper normal in size, stable.  Spleen: No splenic lesions are evident.  Adrenals/Urinary Tract: Adrenals bilaterally appear normal. 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 Note that this mass was recently evaluated on MR. There is a simple appearing cyst in the posterior upper left kidney measuring 1.2 x 1.0 cm. Tiny nearby cyst also noted. 7 x 6 mm cyst noted lower pole left kidney. No hydronephrosis evident on either side. No evident renal or ureteral calculus on either side. Urinary bladder wall thickness is within normal limits.  Stomach/Bowel: There is wall thickening throughout the sigmoid colon and upper rectum. Intermittent wall thickening is noted throughout portions of the ascending colon and transverse colon and to a slightly lesser extent in the descending colon. There is wall thickening at the junction of the terminal ileum and cecum. Ileum otherwise appears unremarkable. No appreciable fistula noted. There is no evident bowel obstruction. Most loops of small bowel are fluid filled.  There is wall thickening in portions of the gastric antrum and pylorus. Duodenum appears essentially unremarkable by CT.  The appendix has a maximum diameter of 6 mm, compared 8 mm on prior study. No appendiceal/periappendiceal region inflammation is evident on this study.  There is no free air or portal venous air.  Vascular/Lymphatic: No abdominal aortic aneurysm. There is aortic atherosclerosis. Major venous structures appear patent. No evident adenopathy in the abdomen or pelvis.  Reproductive: The uterus is absent. No adnexal masses are appreciable.  Other: No ascites or abscess evident  in the abdomen or pelvis. No omental lesions evident. Calcification in the lateral upper pelvic wall soft tissues may represent residua of previous trauma.  Musculoskeletal: No blastic or lytic bone lesions. No intramuscular lesions are evident.  IMPRESSION: 1. Wall thickening involving much of the colon with skip type lesions noted in the ascending, transverse, and descending colon with more uniform wall thickening throughout the sigmoid colon and upper rectum. There is wall thickening at the terminal ileum-cecum junction. This appearance raises question of potential underlying Crohn's disease. A somewhat atypical presentation of infectious colitis is possible. No fistula evident. No bowel pneumatosis. Note that there is aortic atherosclerosis, but major mesenteric vessels appear patent.  2. Wall thickening in portions of the distal stomach consistent with gastritis. Note that Crohn's disease could present in this manner.  3. Appendix appears within normal limits. Appendix slightly smaller compared to recent prior study.  4. Gallbladder absent. Biliary duct dilatation without obstructing lesion again noted.  5. Septated cystic mass arising from the upper pole left kidney appear stable. This mass was evaluated with MR recently.  6. No renal or ureteral calculi. No hydronephrosis. Urinary bladder wall thickness within normal limits.  7.  Uterus absent.  No adnexal masses evident.  8.  Aortic Atherosclerosis (ICD10-I70.0).  7.  Uterus absent.  Electronically Signed   By: Lowella Grip III M.D.   On: 08/18/2020 09:35  Assessment  The primary encounter diagnosis was Sacroiliac joint pain. Diagnoses of SI joint arthritis and Piriformis syndrome of both sides were also pertinent to this visit.  Plan of Care  1. Sacroiliac joint pain - SACROILIAC JOINT INJECTION; Future - TRIGGER POINT INJECTION; Future  2. SI joint arthritis - SACROILIAC JOINT INJECTION; Future  3.  Piriformis syndrome of both sides - TRIGGER POINT INJECTION; Future  Continue with home stretching exercises Continue oxycodone as prescribed, will attempt to wean dose after SI joint injection Orders:  Orders Placed This Encounter  Procedures  . SACROILIAC JOINT INJECTION    Standing Status:   Future    Standing Expiration Date:   11/17/2020    Scheduling Instructions:     Side: Bilateral     Sedation: Patient's choice.     Timeframe: ASAP    Order Specific Question:   Where will this procedure be performed?    Answer:   [facility]  . TRIGGER POINT INJECTION    Standing Status:   Future    Standing Expiration Date:   01/18/2021    Scheduling Instructions:     Area: PIRIFORMIS     Side: Bilateral     Sedation: Patient's choice.     Timeframe: ASAA    Order Specific Question:   Where will this procedure be performed?    Answer:   [facility]   Follow-up plan:   Return in about 2 weeks (around 11/01/2020) for B/L SI-J + Piriformis TPI , with sedation.   Recent Visits Date Type Provider Dept  08/24/20 Office Visit Monica Santa, MD [facility]  Showing recent visits within past 90 days and meeting all other requirements Today's Visits Date Type Provider Dept  10/18/20 Telemedicine Monica Santa, MD [facility]  Showing today's visits and meeting all other requirements Future Appointments Date Type Provider Dept  11/15/20 Appointment Monica Santa, MD [facility]  Showing future appointments within next 90 days and meeting  all other requirements  I discussed the assessment and treatment plan with the patient. The patient was provided an opportunity to ask questions and all were answered. The patient agreed with the plan and demonstrated an understanding of the instructions.  Patient advised to call back or seek an in-person evaluation if the symptoms or condition worsens.  Duration of encounter: 30 minutes.  Note by: Monica Santa, MD Date: 10/18/2020; Time: 3:20 PM

## 2020-10-31 ENCOUNTER — Other Ambulatory Visit: Payer: Self-pay

## 2020-10-31 ENCOUNTER — Encounter: Payer: Self-pay | Admitting: Student in an Organized Health Care Education/Training Program

## 2020-10-31 ENCOUNTER — Ambulatory Visit (HOSPITAL_BASED_OUTPATIENT_CLINIC_OR_DEPARTMENT_OTHER): Payer: BC Managed Care – PPO | Admitting: Student in an Organized Health Care Education/Training Program

## 2020-10-31 ENCOUNTER — Ambulatory Visit
Admission: RE | Admit: 2020-10-31 | Discharge: 2020-10-31 | Disposition: A | Discharge status: Home / Self Care | Payer: BC Managed Care – PPO | Source: Ambulatory Visit | Attending: Student in an Organized Health Care Education/Training Program | Admitting: Student in an Organized Health Care Education/Training Program

## 2020-10-31 DIAGNOSIS — M545 Low back pain, unspecified: Secondary | ICD-10-CM | POA: Diagnosis not present

## 2020-10-31 DIAGNOSIS — Z888 Allergy status to other drugs, medicaments and biological substances status: Secondary | ICD-10-CM | POA: Diagnosis not present

## 2020-10-31 DIAGNOSIS — Z7952 Long term (current) use of systemic steroids: Secondary | ICD-10-CM | POA: Insufficient documentation

## 2020-10-31 DIAGNOSIS — M533 Sacrococcygeal disorders, not elsewhere classified: Secondary | ICD-10-CM

## 2020-10-31 DIAGNOSIS — Z881 Allergy status to other antibiotic agents status: Secondary | ICD-10-CM | POA: Diagnosis not present

## 2020-10-31 DIAGNOSIS — M461 Sacroiliitis, not elsewhere classified: Secondary | ICD-10-CM | POA: Diagnosis not present

## 2020-10-31 DIAGNOSIS — M47818 Spondylosis without myelopathy or radiculopathy, sacral and sacrococcygeal region: Secondary | ICD-10-CM

## 2020-10-31 DIAGNOSIS — Z79899 Other long term (current) drug therapy: Secondary | ICD-10-CM | POA: Insufficient documentation

## 2020-10-31 DIAGNOSIS — Z880 Allergy status to penicillin: Secondary | ICD-10-CM | POA: Insufficient documentation

## 2020-10-31 DIAGNOSIS — Z886 Allergy status to analgesic agent status: Secondary | ICD-10-CM | POA: Diagnosis not present

## 2020-10-31 MED ORDER — IOHEXOL 180 MG/ML  SOLN
10.0000 mL | Freq: Once | INTRAMUSCULAR | Status: AC
  Administered 2020-10-31: 10 mL via INTRA_ARTICULAR

## 2020-10-31 MED ORDER — FENTANYL CITRATE (PF) 100 MCG/2ML IJ SOLN
INTRAMUSCULAR | Status: AC
  Filled 2020-10-31: qty 2

## 2020-10-31 MED ORDER — DEXAMETHASONE SODIUM PHOSPHATE 10 MG/ML IJ SOLN
10.0000 mg | Freq: Once | INTRAMUSCULAR | Status: AC
  Administered 2020-10-31: 10 mg

## 2020-10-31 MED ORDER — DEXAMETHASONE SODIUM PHOSPHATE 10 MG/ML IJ SOLN
INTRAMUSCULAR | Status: AC
  Filled 2020-10-31: qty 2

## 2020-10-31 MED ORDER — ROPIVACAINE HCL 2 MG/ML IJ SOLN
9.0000 mL | Freq: Once | INTRAMUSCULAR | Status: AC
  Administered 2020-10-31: 9 mL via PERINEURAL

## 2020-10-31 MED ORDER — ROPIVACAINE HCL 2 MG/ML IJ SOLN
INTRAMUSCULAR | Status: AC
  Filled 2020-10-31: qty 20

## 2020-10-31 MED ORDER — LIDOCAINE HCL 2 % IJ SOLN
INTRAMUSCULAR | Status: AC
  Filled 2020-10-31: qty 10

## 2020-10-31 MED ORDER — FENTANYL CITRATE (PF) 100 MCG/2ML IJ SOLN
25.0000 ug | INTRAMUSCULAR | Status: AC | PRN
  Administered 2020-10-31 (×2): 50 ug via INTRAVENOUS

## 2020-10-31 NOTE — Patient Instructions (Signed)

## 2020-10-31 NOTE — Progress Notes (Signed)
Patient's Name: [name]  MRN: [id number]  Referring Provider: Gillis Santa, MD  DOB: [birth year]  PCP: Vidal Schwalbe, MD  DOS: 10/31/2020  Note by: Gillis Santa, MD  Service setting: Ambulatory outpatient  Specialty: Interventional Pain Management  Patient type: Established  Location: [facility] (AMB) Pain Management Facility  Visit type: Interventional Procedure   Primary Reason for Visit: Interventional Pain Management Treatment. CC: Back Pain (low)  Procedure:  Anesthesia, Analgesia, Anxiolysis:  Type: Therapeutic Sacroiliac Joint Steroid Injection #1 for 2022  Region: Inferior Lumbosacral Region Level: PIIS (Posterior Inferior Iliac Spine) Laterality: Bilateral  Type: Moderate (Conscious) Sedation combined with Local Anesthesia Indication(s): Analgesia and Anxiety Route: Intravenous (IV) IV Access: Secured Sedation: Meaningful verbal contact was maintained at all times during the procedure  Local Anesthetic: Lidocaine 1%   Indications: 1. Sacroiliac joint pain   2. SI joint arthritis    Pain Score: Pre-procedure: 7 /10 Post-procedure: 5 /10   Pre-op Assessment:  [name] is a 50 y.o. (year old), female patient, seen today for interventional treatment. She  has a past surgical history that includes Abdominal hysterectomy; Cholecystectomy; Hand surgery; Back surgery; Colonoscopy with esophagogastroduodenoscopy (egd) (07/2009); and Esophagogastroduodenoscopy (06/2007). [name] has a current medication list which includes the following prescription(s): clonazepam, famotidine, hydroxyzine, metoprolol succinate, ondansetron, oxycodone-acetaminophen, polyethylene glycol-electrolytes, promethazine, temazepam, fluticasone, and prednisone. Her primarily concern today is the Back Pain (low)  Initial Vital Signs:  Pulse/HCG Rate: (!) 59ECG Heart Rate: (!) 59 Temp: (!) 97.1 F (36.2 C) Resp: 16 BP: (!) 183/75 SpO2: 100 %  BMI: Estimated body mass index is 21.73 kg/m as calculated  from the following:   Height as of this encounter: 5\' 1"  (1.549 m).   Weight as of this encounter: 115 lb (52.2 kg).  Risk Assessment: Allergies: Reviewed. She is allergic to aripiprazole, buprenorphine, cefaclor, gabapentin, lisinopril, seroquel [quetiapine], doxycycline, ibuprofen, levofloxacin, macrobid [nitrofurantoin monohyd macro], naproxen, and penicillins.  Allergy Precautions: None required Coagulopathies: Reviewed. None identified.  Blood-thinner therapy: None at this time Active Infection(s): Reviewed. None identified. [name] is afebrile  Site Confirmation: [name] was asked to confirm the procedure and laterality before marking the site Procedure checklist: Completed Consent: Before the procedure and under the influence of no sedative(s), amnesic(s), or anxiolytics, the patient was informed of the treatment options, risks and possible complications. To fulfill our ethical and legal obligations, as recommended by the American Medical Association's Code of Ethics, I have informed the patient of my clinical impression; the nature and purpose of the treatment or procedure; the risks, benefits, and possible complications of the intervention; the alternatives, including doing nothing; the risk(s) and benefit(s) of the alternative treatment(s) or procedure(s); and the risk(s) and benefit(s) of doing nothing. The patient was provided information about the general risks and possible complications associated with the procedure. These may include, but are not limited to: failure to achieve desired goals, infection, bleeding, organ or nerve damage, allergic reactions, paralysis, and death. In addition, the patient was informed of those risks and complications associated to the procedure, such as failure to decrease pain; infection; bleeding; organ or nerve damage with subsequent damage to sensory, motor, and/or autonomic systems, resulting in permanent pain, numbness, and/or weakness of one or  several areas of the body; allergic reactions; (i.e.: anaphylactic reaction); and/or death. Furthermore, the patient was informed of those risks and complications associated with the medications. These include, but are not limited to: allergic reactions (i.e.: anaphylactic or anaphylactoid reaction(s)); adrenal axis suppression; blood sugar elevation  that in diabetics may result in ketoacidosis or comma; water retention that in patients with history of congestive heart failure may result in shortness of breath, pulmonary edema, and decompensation with resultant heart failure; weight gain; swelling or edema; medication-induced neural toxicity; particulate matter embolism and blood vessel occlusion with resultant organ, and/or nervous system infarction; and/or aseptic necrosis of one or more joints. Finally, the patient was informed that Medicine is not an exact science; therefore, there is also the possibility of unforeseen or unpredictable risks and/or possible complications that may result in a catastrophic outcome. The patient indicated having understood very clearly. We have given the patient no guarantees and we have made no promises. Enough time was given to the patient to ask questions, all of which were answered to the patient's satisfaction. [name] has indicated that she wanted to continue with the procedure. Attestation: I, the ordering provider, attest that I have discussed with the patient the benefits, risks, side-effects, alternatives, likelihood of achieving goals, and potential problems during recovery for the procedure that I have provided informed consent. Date  Time: 10/31/2020  9:01 AM  Pre-Procedure Preparation:  Monitoring: As per clinic protocol. Respiration, ETCO2, SpO2, BP, heart rate and rhythm monitor placed and checked for adequate function Safety Precautions: Patient was assessed for positional comfort and pressure points before starting the procedure. Time-out: I initiated  and conducted the "Time-out" before starting the procedure, as per protocol. The patient was asked to participate by confirming the accuracy of the "Time Out" information. Verification of the correct person, site, and procedure were performed and confirmed by me, the nursing staff, and the patient. "Time-out" conducted as per Joint Commission's Universal Protocol (UP.01.01.01). Time: 0939  Description of Procedure:       Position: Prone Target Area: Inferior, posterior, aspect of the sacroiliac fissure Approach: Posterior, paraspinal, ipsilateral approach. Area Prepped: Entire Lower Lumbosacral Region Prepping solution: ChloraPrep (2% chlorhexidine gluconate and 70% isopropyl alcohol) Safety Precautions: Aspiration looking for blood return was conducted prior to all injections. At no point did we inject any substances, as a needle was being advanced. No attempts were made at seeking any paresthesias. Safe injection practices and needle disposal techniques used. Medications properly checked for expiration dates. SDV (single dose vial) medications used. Description of the Procedure: Protocol guidelines were followed. The patient was placed in position over the procedure table. The target area was identified and the area prepped in the usual manner. Skin & deeper tissues infiltrated with local anesthetic. Appropriate amount of time allowed to pass for local anesthetics to take effect. The procedure needle was advanced under fluoroscopic guidance into the sacroiliac joint until a firm endpoint was obtained. Proper needle placement secured. Negative aspiration confirmed. Solution injected in intermittent fashion, asking for systemic symptoms every 0.5cc of injectate. The needles were then removed and the area cleansed, making sure to leave some of the prepping solution back to take advantage of its long term bactericidal properties. Vitals:   10/31/20 0945 10/31/20 0955 10/31/20 1005 10/31/20 1015  BP:  135/89 (!) 162/92 (!) 192/86 (!) 169/92  Pulse: 63     Resp: 15 16 16 16   Temp:  (!) 97.1 F (36.2 C)  (!) 97.1 F (36.2 C)  SpO2: 100% 100% 100% 100%  Weight:      Height:        Start Time: 0939 hrs. End Time: 0945 hrs. Materials:  Needle(s) Type: Regular needle Gauge: 22G Length: 3.5-in Medication(s): Please see orders for medications and dosing  details. 10 cc solution made of 8 cc of 0.2% ropivacaine, 2 cc of Decadron 10 mg/cc.  2.5 cc injected intra-articular, 2.5 cc injected periarticular on each side. Imaging Guidance (Non-Spinal):  Type of Imaging Technique: Fluoroscopy Guidance (Non-Spinal) Indication(s): Assistance in needle guidance and placement for procedures requiring needle placement in or near specific anatomical locations not easily accessible without such assistance. Exposure Time: Please see nurses notes. Contrast: Before injecting any contrast, we confirmed that the patient did not have an allergy to iodine, shellfish, or radiological contrast. Once satisfactory needle placement was completed at the desired level, radiological contrast was injected. Contrast injected under live fluoroscopy. No contrast complications. See chart for type and volume of contrast used. Fluoroscopic Guidance: I was personally present during the use of fluoroscopy. "Tunnel Vision Technique" used to obtain the best possible view of the target area. Parallax error corrected before commencing the procedure. "Direction-depth-direction" technique used to introduce the needle under continuous pulsed fluoroscopy. Once target was reached, antero-posterior, oblique, and lateral fluoroscopic projection used confirm needle placement in all planes. Images permanently stored in EMR. Interpretation: I personally interpreted the imaging intraoperatively. Adequate needle placement confirmed in multiple planes. Appropriate spread of contrast into desired area was observed. No evidence of afferent or efferent  intravascular uptake. Permanent images saved into the patient's record.   Post-operative Assessment:  Post-procedure Vital Signs:  Pulse/HCG Rate: 63 (sb)(!) 59 Temp: (!) 97.1 F (36.2 C) Resp: 16 BP: (!) 169/92 SpO2: 100 %  EBL: None  Complications: No immediate post-treatment complications observed by team, or reported by patient.  Note: The patient tolerated the entire procedure well. A repeat set of vitals were taken after the procedure and the patient was kept under observation following institutional policy, for this type of procedure. Post-procedural neurological assessment was performed, showing return to baseline, prior to discharge. The patient was provided with post-procedure discharge instructions, including a section on how to identify potential problems. Should any problems arise concerning this procedure, the patient was given instructions to immediately contact us, at any time, without hesitation. In any case, we plan to contact the patient by telephone for a follow-up status report regarding this interventional procedure.  Comments:  No additional relevant information. 5 out of 5 strength bilateral lower extremity: Plantar flexion, dorsiflexion, knee flexion, knee extension.  Plan of Care   Imaging Orders  DG PAIN CLINIC C-ARM 1-60 MIN NO REPORT    Medications ordered for procedure: Meds ordered this encounter  Medications   iohexol (OMNIPAQUE) 180 MG/ML injection 10 mL    Must be Myelogram-compatible. If not available, you may substitute with a water-soluble, non-ionic, hypoallergenic, myelogram-compatible radiological contrast medium.   fentaNYL (SUBLIMAZE) injection 25-50 mcg    Make sure Narcan is available in the pyxis when using this medication. In the event of respiratory depression (RR< 8/min): Titrate NARCAN (naloxone) in increments of 0.1 to 0.2 mg IV at 2-3 minute intervals, until desired degree of reversal.   ropivacaine (PF) 2 mg/mL (0.2%) (NAROPIN)  injection 9 mL   dexamethasone (DECADRON) injection 10 mg   dexamethasone (DECADRON) injection 10 mg    Medications administered: We administered iohexol, fentaNYL, ropivacaine (PF) 2 mg/mL (0.2%), dexamethasone, and dexamethasone.  See the medical record for exact dosing, route, and time of administration.  New Prescriptions   No medications on file   Disposition: Discharge home  Discharge Date & Time: 10/31/2020; 1015 hrs.   Physician-requested Follow-up: Return for Keep sch. appt.  Future Appointments  Date Time Provider  [place]  11/15/2020 11:00 AM Gillis Santa, MD [facility] None   Primary Care Physician: Vidal Schwalbe, MD Location: [facility] Outpatient Pain Management Facility Note by: Gillis Santa, MD Date: 10/31/2020; Time: 10:41 AM  Disclaimer:  Medicine is not an exact science. The only guarantee in medicine is that nothing is guaranteed. It is important to note that the decision to proceed with this intervention was based on the information collected from the patient. The Data and conclusions were drawn from the patient's questionnaire, the interview, and the physical examination. Because the information was provided in large part by the patient, it cannot be guaranteed that it has not been purposely or unconsciously manipulated. Every effort has been made to obtain as much relevant data as possible for this evaluation. It is important to note that the conclusions that lead to this procedure are derived in large part from the available data. Always take into account that the treatment will also be dependent on availability of resources and existing treatment guidelines, considered by other Pain Management Practitioners as being common knowledge and practice, at the time of the intervention. For Medico-Legal purposes, it is also important to point out that variation in procedural techniques and pharmacological choices are the acceptable norm. The indications, contraindications,  technique, and results of the above procedure should only be interpreted and judged by a Board-Certified Interventional Pain Specialist with extensive familiarity and expertise in the same exact procedure and technique.

## 2020-10-31 NOTE — Progress Notes (Signed)
Nursing Pain Medication Assessment:  Safety precautions to be maintained throughout the outpatient stay will include: orient to surroundings, keep bed in low position, maintain call bell within reach at all times, provide assistance with transfer out of bed and ambulation.  Medication Inspection Compliance: [name] did not comply with our request to bring her pills to be counted. She was reminded that bringing the medication bottles, even when empty, is a requirement.  Medication: None brought in. Pill/Patch Count: None available to be counted. Bottle Appearance: No container available. Did not bring bottle(s) to appointment. Filled Date: N/A Last Medication intake:  Today

## 2020-11-01 ENCOUNTER — Telehealth: Payer: Self-pay

## 2020-11-01 NOTE — Telephone Encounter (Signed)
POst procedure phone call.  Voicemail has not been set up yet.  Unable to leave message.

## 2020-11-11 ENCOUNTER — Encounter (HOSPITAL_COMMUNITY): Payer: Self-pay | Admitting: Emergency Medicine

## 2020-11-11 ENCOUNTER — Emergency Department (HOSPITAL_COMMUNITY): Payer: BC Managed Care – PPO

## 2020-11-11 ENCOUNTER — Other Ambulatory Visit: Payer: Self-pay

## 2020-11-11 ENCOUNTER — Emergency Department (HOSPITAL_COMMUNITY)
Admission: EM | Admit: 2020-11-11 | Discharge: 2020-11-11 | Disposition: A | Discharge status: Home / Self Care | Payer: BC Managed Care – PPO | Attending: Emergency Medicine | Admitting: Emergency Medicine

## 2020-11-11 DIAGNOSIS — F17210 Nicotine dependence, cigarettes, uncomplicated: Secondary | ICD-10-CM | POA: Diagnosis not present

## 2020-11-11 DIAGNOSIS — R1084 Generalized abdominal pain: Secondary | ICD-10-CM | POA: Insufficient documentation

## 2020-11-11 DIAGNOSIS — Z79899 Other long term (current) drug therapy: Secondary | ICD-10-CM | POA: Insufficient documentation

## 2020-11-11 DIAGNOSIS — R112 Nausea with vomiting, unspecified: Secondary | ICD-10-CM | POA: Insufficient documentation

## 2020-11-11 DIAGNOSIS — I10 Essential (primary) hypertension: Secondary | ICD-10-CM | POA: Insufficient documentation

## 2020-11-11 LAB — CBC WITH DIFFERENTIAL/PLATELET
Abs Immature Granulocytes: 0.05 10*3/uL (ref 0.00–0.07)
Basophils Absolute: 0.1 10*3/uL (ref 0.0–0.1)
Basophils Relative: 1 %
Eosinophils Absolute: 0 10*3/uL (ref 0.0–0.5)
Eosinophils Relative: 0 %
HCT: 41.1 % (ref 36.0–46.0)
Hemoglobin: 13.2 g/dL (ref 12.0–15.0)
Immature Granulocytes: 1 %
Lymphocytes Relative: 11 %
Lymphs Abs: 1.2 10*3/uL (ref 0.7–4.0)
MCH: 30.9 pg (ref 26.0–34.0)
MCHC: 32.1 g/dL (ref 30.0–36.0)
MCV: 96.3 fL (ref 80.0–100.0)
Monocytes Absolute: 0.4 10*3/uL (ref 0.1–1.0)
Monocytes Relative: 4 %
Neutro Abs: 9 10*3/uL — ABNORMAL HIGH (ref 1.7–7.7)
Neutrophils Relative %: 83 %
Platelets: 316 10*3/uL (ref 150–400)
RBC: 4.27 MIL/uL (ref 3.87–5.11)
RDW: 15.5 % (ref 11.5–15.5)
WBC: 10.7 10*3/uL — ABNORMAL HIGH (ref 4.0–10.5)
nRBC: 0 % (ref 0.0–0.2)

## 2020-11-11 LAB — COMPREHENSIVE METABOLIC PANEL
ALT: 27 U/L (ref 0–44)
AST: 32 U/L (ref 15–41)
Albumin: 3.5 g/dL (ref 3.5–5.0)
Alkaline Phosphatase: 85 U/L (ref 38–126)
Anion gap: 11 (ref 5–15)
BUN: 24 mg/dL — ABNORMAL HIGH (ref 6–20)
CO2: 28 mmol/L (ref 22–32)
Calcium: 8.7 mg/dL — ABNORMAL LOW (ref 8.9–10.3)
Chloride: 104 mmol/L (ref 98–111)
Creatinine, Ser: 0.92 mg/dL (ref 0.44–1.00)
GFR, Estimated: >60 mL/min (ref >60)
Glucose, Bld: 130 mg/dL — ABNORMAL HIGH (ref 70–99)
Potassium: 4.3 mmol/L (ref 3.5–5.1)
Sodium: 143 mmol/L (ref 135–145)
Total Bilirubin: 0.2 mg/dL — ABNORMAL LOW (ref 0.3–1.2)
Total Protein: 7 g/dL (ref 6.5–8.1)

## 2020-11-11 LAB — LIPASE, BLOOD: Lipase: 32 U/L (ref 11–51)

## 2020-11-11 IMAGING — CT CT ABD-PELV W/ CM
2 of 5 series · 16 of 46 positions shown, 18 images · IV contrast (omnipaque)
Comparison: [DATE] CT and prior studies

CLINICAL DATA: 49-year-old female with acute abdominal and pelvic
pain.

EXAM:
CT ABDOMEN AND PELVIS WITH CONTRAST
TECHNIQUE: Multidetector CT imaging of the abdomen and pelvis was performed
using the standard protocol following bolus administration of
intravenous contrast.
CONTRAST:  100mL OMNIPAQUE IOHEXOL 300 MG/ML  SOLN

[Series 2: axial st · axial · 0.57mm/px · z∈[-681,-316]mm · 13 of 83 slices shown, 15 images]
[im 5/83  soft-tissue]
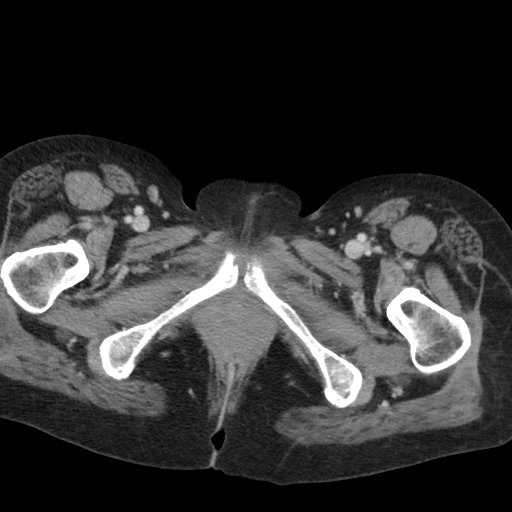
[im 5/83  bone]
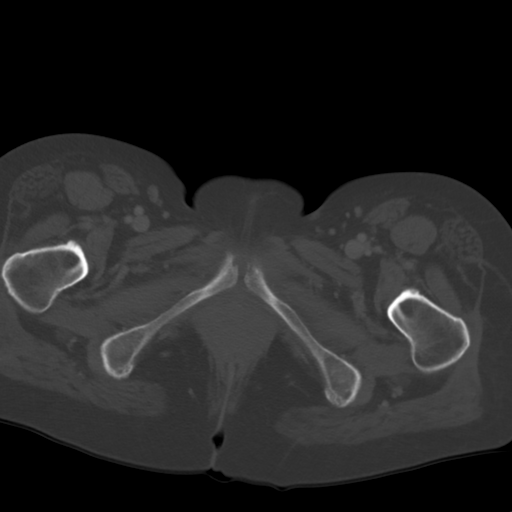
[im 13/83  soft-tissue]
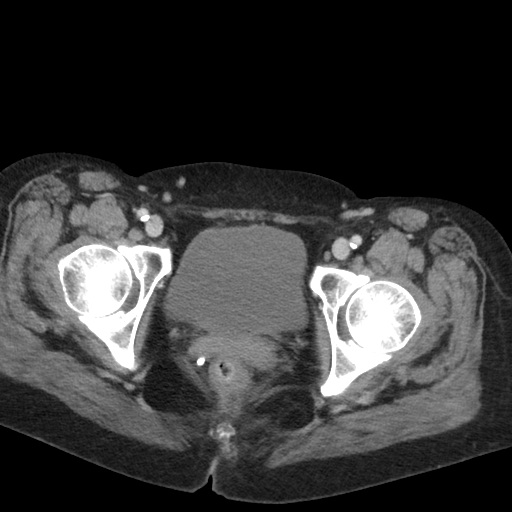
[im 18/83  soft-tissue]
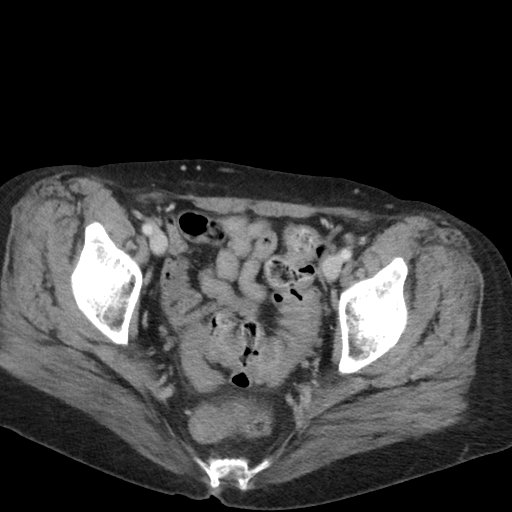
[im 22/83  soft-tissue]
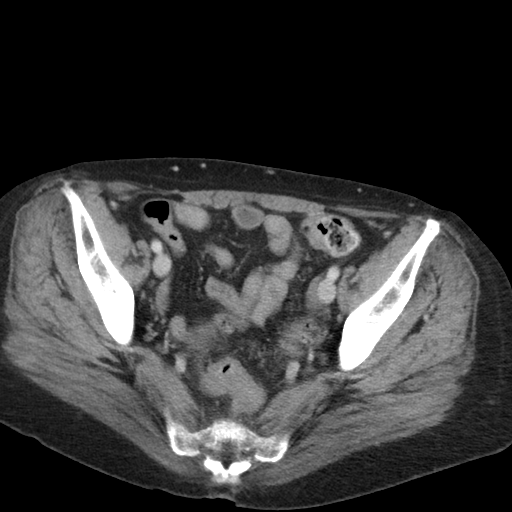
[im 31/83  soft-tissue]
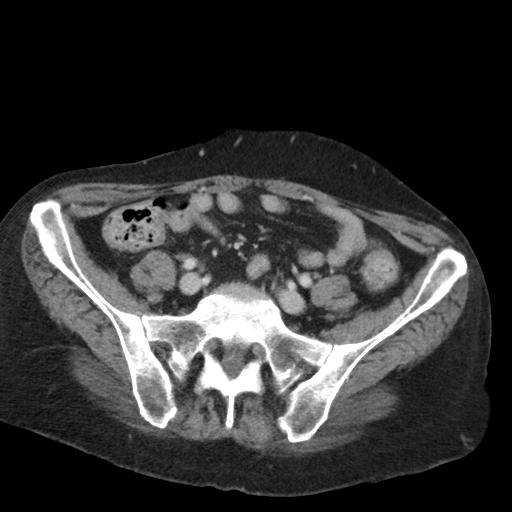
[im 35/83  soft-tissue]
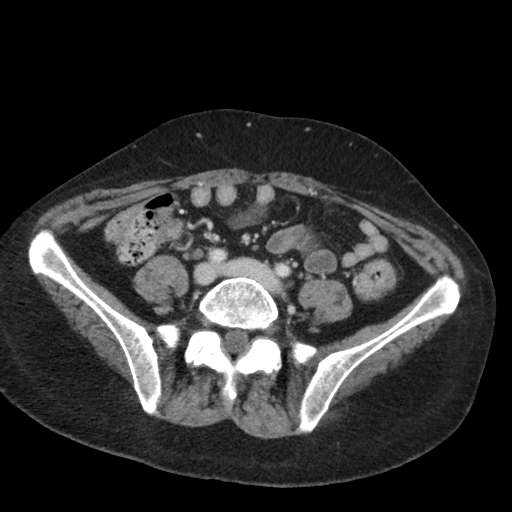
[im 44/83  soft-tissue]
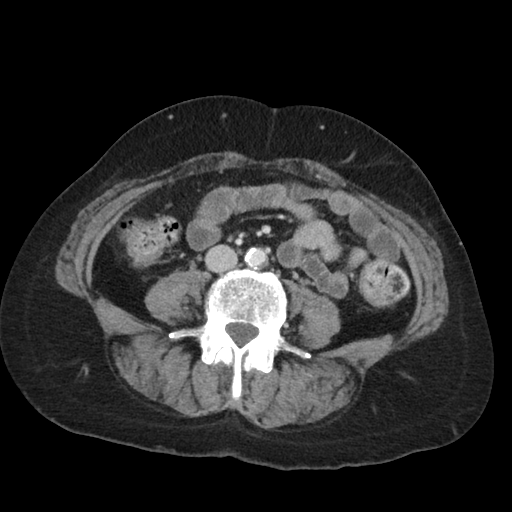
[im 48/83  soft-tissue]
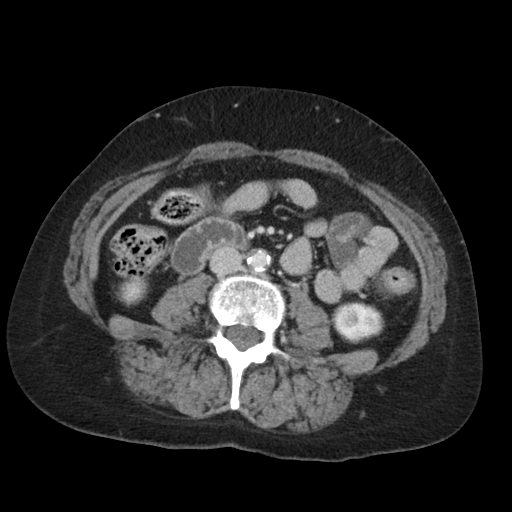
[im 52/83  soft-tissue]
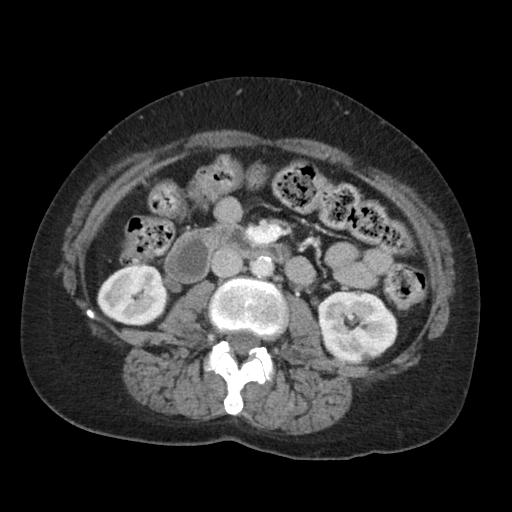
[im 52/83  bone]
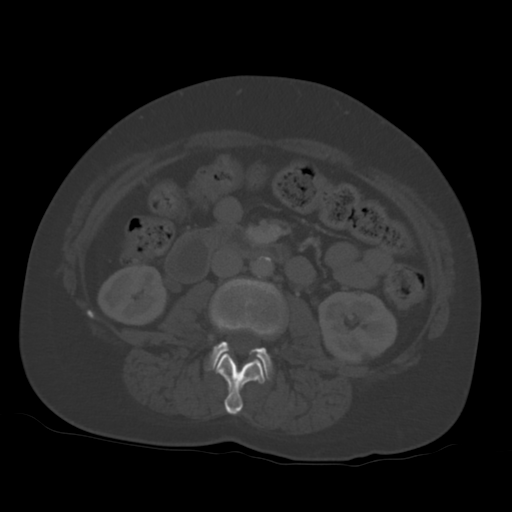
[im 61/83  soft-tissue]
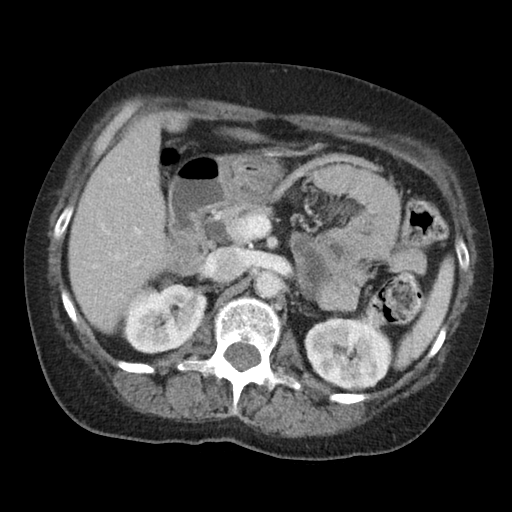
[im 65/83  soft-tissue]
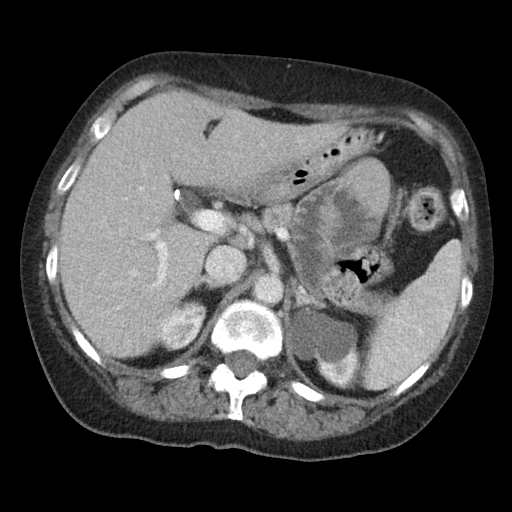
[im 70/83  soft-tissue]
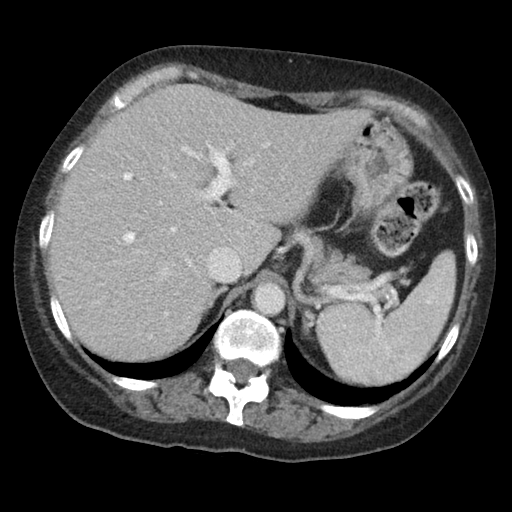
[im 78/83  soft-tissue]
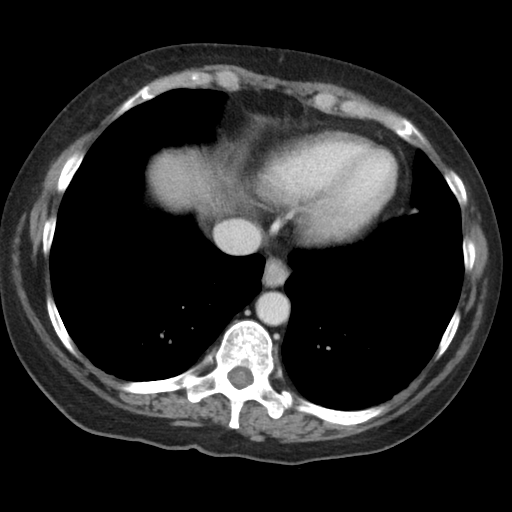

[Series 5: coronal st · coronal · 0.64mm/px · 3 of 84 slices shown]
[im 28/84  soft-tissue]
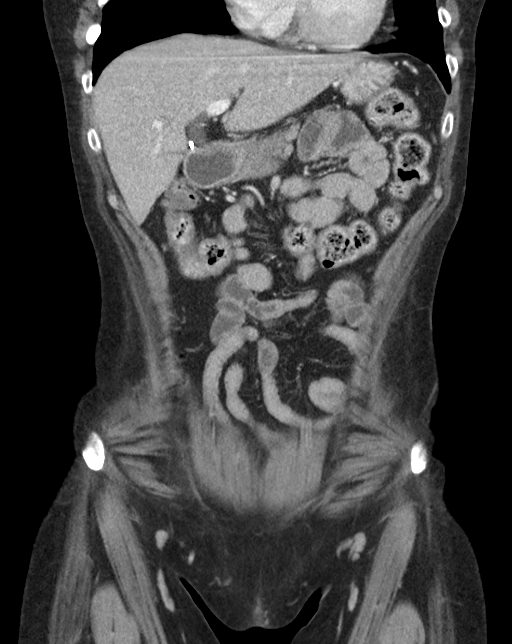
[im 37/84  soft-tissue]
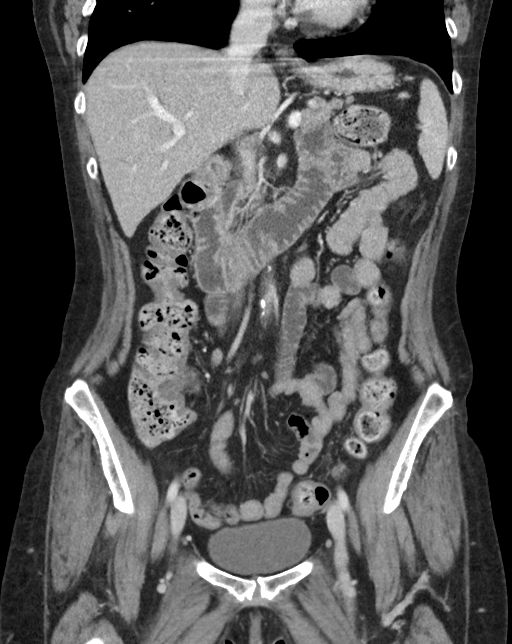
[im 47/84  soft-tissue]
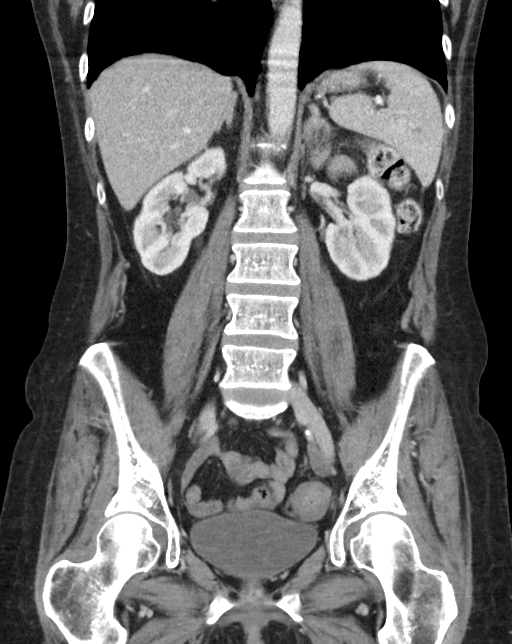

[16 of 46 positions shown; findings below may reference images not displayed]

FINDINGS: Lower chest: No acute abnormality.

Hepatobiliary: The liver is unremarkable. The patient is status post
cholecystectomy. No biliary dilatation.

Pancreas: Unremarkable

Spleen: Unremarkable

Adrenals/Urinary Tract: The kidneys, adrenal glands and bladder are
unremarkable except for stable LEFT renal cysts.

Stomach/Bowel: Stomach is within normal limits. Appendix appears
unchanged with maximal diameter 6 mm. No evidence of bowel wall
thickening, distention, or inflammatory changes.

Vascular/Lymphatic: Aortic atherosclerosis. No enlarged abdominal or
pelvic lymph nodes.

Reproductive: Status post hysterectomy. No adnexal masses.

Other: No ascites, pneumoperitoneum or focal collection.

Musculoskeletal: No acute or suspicious bony abnormalities
identified.
IMPRESSION: 1. No evidence of acute abnormality.
2. Aortic Atherosclerosis ([PR]-[PR]).

## 2020-11-11 MED ORDER — ONDANSETRON 4 MG PO TBDP: 4.0000 mg | ORAL_TABLET | Freq: Three times a day (TID) | ORAL | 1 refills | Status: DC | PRN

## 2020-11-11 MED ORDER — HYDROMORPHONE HCL 1 MG/ML IJ SOLN
1.0000 mg | Freq: Once | INTRAMUSCULAR | Status: AC
  Administered 2020-11-11: 1 mg via INTRAVENOUS
  Filled 2020-11-11: qty 1

## 2020-11-11 MED ORDER — SODIUM CHLORIDE 0.9 % IV BOLUS
1000.0000 mL | Freq: Once | INTRAVENOUS | Status: AC
  Administered 2020-11-11: 1000 mL via INTRAVENOUS

## 2020-11-11 MED ORDER — IOHEXOL 300 MG/ML  SOLN
100.0000 mL | Freq: Once | INTRAMUSCULAR | Status: AC | PRN
  Administered 2020-11-11: 100 mL via INTRAVENOUS

## 2020-11-11 MED ORDER — SODIUM CHLORIDE 0.9 % IV SOLN: INTRAVENOUS | Status: DC

## 2020-11-11 MED ORDER — ONDANSETRON HCL 4 MG/2ML IJ SOLN
4.0000 mg | Freq: Once | INTRAMUSCULAR | Status: AC
  Administered 2020-11-11: 4 mg via INTRAVENOUS
  Filled 2020-11-11: qty 2

## 2020-11-11 NOTE — Discharge Instructions (Addendum)
Work-up today without any acute findings in the abdomen CT scan normal.  Appendix is unchanged from before.  Which is reassuring.  Follow-up with your doctors.  Follow-up with pain management.

## 2020-11-11 NOTE — ED Triage Notes (Addendum)
Pt c/o abd pain x 2 days. H/s colitis.  N/v, unable to tolerate po medication. Pt received 4mg  zofran IV in route per EMS.

## 2020-11-11 NOTE — ED Provider Notes (Signed)
[facility] EMERGENCY DEPARTMENT Provider Note   CSN: [id number] Arrival date & time: 11/11/20  5277     History Chief Complaint  Patient presents with   Abdominal Pain    [name] is a 50 y.o. female.  Patient brought in by [facility].  Patient with acute onset of abdominal pain yesterday morning.  The pain is all over.  Patient is followed by pain management.  Patient's had a history of questionably enlarged appendix in the past and colitis.  She thinks this is colitis.  But she also has a history of irritable bowel syndrome.  Patient's had nausea and vomiting but no diarrhea.  No fevers.  She is followed by gastroenterology.  And it also appears there was consideration of removing her appendix based on CT and consultation with Dr. Arnoldo Morale.  Patient states they are still planning to do that.  And it was delayed until she could have a colonoscopy to rule out colitis.  Patient arrived in apparent severe pain.      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/19/2018   Long term prescription opiate use 05/19/2018   Lumbar radiculopathy 02/04/2017   Lumbar degenerative disc disease 02/04/2017   Lumbar spondylosis 02/04/2017   Chronic low back pain with bilateral sciatica 02/04/2017   Chronic pain syndrome 02/04/2017   Lumbar sprain 02/04/2017   Chronic migraine without aura without status migrainosus, not intractable 02/04/2017    Past Surgical History:  Procedure Laterality Date   ABDOMINAL HYSTERECTOMY     BACK SURGERY     CHOLECYSTECTOMY     Complicated by bile leak requiring ERCP with temporary stenting   COLONOSCOPY WITH ESOPHAGOGASTRODUODENOSCOPY (EGD)  07/2009   [facility]: Op notes cannot be seen through  care everywhere however pathology showed terminal ileum and random colon biopsies normal.  Stomach biopsy with gastric antral and fundic mucosa with reactive foveolar hyperplasia.  No active gastritis.  Stains negative for H. pylori.   ESOPHAGOGASTRODUODENOSCOPY  06/2007   [facility]: Normal esophagus, gastric mucosal abnormality with erythema, biopsy showed gastric antral and fundic mucosa with reactive foveolar hyperplasia, no gastritis, no H. pylori.   HAND SURGERY       OB History     Gravida  2   Para  2   Term  2   Preterm      AB      Living  2      SAB      IAB      Ectopic      Multiple      Live Births              Family History  Problem Relation Age of Onset   Cancer Other    Seizures Other    Stroke Other    Diabetes Other     Social History   Tobacco Use   Smoking status: Every Day    Packs/day: 1.00    Years: 20.00    Pack years: 20.00    Types: Cigarettes   Smokeless tobacco: Never  Vaping Use   Vaping Use: Never  used  Substance Use Topics   Alcohol use: No   Drug use: No    Home Medications Prior to Admission medications   Medication Sig Start Date End Date Taking? Authorizing Provider  clonazePAM (KLONOPIN) 0.5 MG tablet Take 0.5 mg by mouth daily as needed for anxiety.  09/12/19   [provider]  famotidine (PEPCID) 20 MG tablet Take 20 mg by mouth 2 (two) times daily. 09/27/20   [provider]  fluticasone (FLONASE) 50 MCG/ACT nasal spray Place 1 spray into both nostrils daily. Patient not taking: Reported on 10/31/2020 12/21/19   [provider]  hydrOXYzine (ATARAX/VISTARIL) 50 MG tablet Take by mouth 2 (two) times daily. 09/29/20   [provider]  metoprolol succinate (TOPROL-XL) 100 MG 24 hr tablet Take 100 mg by mouth daily. Take with or immediately following a meal.    [provider]  ondansetron (ZOFRAN) 4 MG tablet TAKE 1 TABLET BY MOUTH EVERY 6 HOURS AS NEEDED FOR NAUSEA 09/12/20    Eloise Harman, DO  oxyCODONE-acetaminophen (PERCOCET/ROXICET) 5-325 MG tablet Take 1 tablet by mouth every 8 (eight) hours as needed for severe pain. 10/28/20 11/27/20  Gillis Santa, MD  polyethylene glycol-electrolytes (NULYTELY) 420 g solution As directed 09/12/20   Eloise Harman, DO  predniSONE (DELTASONE) 10 MG tablet Take 30 mg (3 tablets) by mouth daily for 7 days.  Take 20 mg (2 tablets) by mouth daily for 7 days.  Take 10 mg (1 tablet) by mouth daily for 7 days.  Take 5 mg (half a tablet) by mouth daily for 7 days. Patient not taking: No sig reported 08/19/20   Mahala Menghini, PA-C  promethazine (PHENERGAN) 25 MG tablet Take by mouth. 09/05/20   [provider]  temazepam (RESTORIL) 30 MG capsule Take 30 mg by mouth at bedtime as needed for sleep.    [provider]    Allergies    Aripiprazole, Buprenorphine, Cefaclor, Gabapentin, Lisinopril, Seroquel [quetiapine], Doxycycline, Ibuprofen, Levofloxacin, Macrobid [nitrofurantoin monohyd macro], Naproxen, and Penicillins  Review of Systems   Review of Systems  Constitutional:  Negative for chills and fever.  HENT:  Negative for ear pain and sore throat.   Eyes:  Negative for pain and visual disturbance.  Respiratory:  Negative for cough and shortness of breath.   Cardiovascular:  Negative for chest pain and palpitations.  Gastrointestinal:  Positive for abdominal pain, nausea and vomiting. Negative for diarrhea.  Genitourinary:  Negative for dysuria and hematuria.  Musculoskeletal:  Negative for arthralgias and back pain.  Skin:  Negative for color change and rash.  Neurological:  Negative for seizures and syncope.  All other systems reviewed and are negative.  Physical Exam Updated Vital Signs BP (!) 182/82 (BP Location: Left Arm)   Pulse 82   Temp 97.9 F (36.6 C) (Oral)   Resp 20   Ht 1.549 m (5\' 1" )   Wt 52.2 kg   SpO2 100%   BMI 21.73 kg/m   Physical Exam Vitals and nursing note reviewed.   Constitutional:      General: She is in acute distress.     Appearance: Normal appearance. She is well-developed. She is ill-appearing.  HENT:     Head: Normocephalic and atraumatic.  Eyes:     Conjunctiva/sclera: Conjunctivae normal.     Pupils: Pupils are equal, round, and reactive to light.  Cardiovascular:     Rate and Rhythm: Normal rate and regular rhythm.     Heart sounds: No  murmur heard. Pulmonary:     Effort: Pulmonary effort is normal. No respiratory distress.     Breath sounds: Normal breath sounds. No wheezing.  Abdominal:     Palpations: Abdomen is soft.     Tenderness: There is abdominal tenderness. There is no guarding.     Comments: Mild diffuse tenderness no guarding  Musculoskeletal:        General: No swelling.     Cervical back: Normal range of motion and neck supple.  Skin:    General: Skin is warm and dry.  Neurological:     General: No focal deficit present.     Mental Status: She is alert and oriented to person, place, and time.     Cranial Nerves: No cranial nerve deficit.     Sensory: No sensory deficit.     Motor: No weakness.     Coordination: Coordination normal.    ED Results / Procedures / Treatments   Labs (all labs ordered are listed, but only abnormal results are displayed) Labs Reviewed - No data to display  EKG None  Radiology No results found.  Procedures Procedures   Medications Ordered in ED Medications - No data to display  ED Course  I have reviewed the triage vital signs and the nursing notes.  Pertinent labs & imaging results that were available during my care of the patient were reviewed by me and considered in my medical decision making (see chart for details).    MDM Rules/Calculators/A&P                          Work-up without any significant findings.  Normal liver function test.  Lipase was normal.  Mild leukocytosis with a white blood cell count of 10.7.  Hemoglobin 13.2.  CT scan of the abdomen showed no  evidence of colitis or inflammatory changes.  Or any acute findings.  Appendix was still enlarged but and unchanged from previous.  Patient made aware of these findings.  Patient with significant improvement with 2 doses of hydromorphone and 2 doses of Zofran.  Patient will get a outpatient prescription for Zofran ODT.  Since patient is under pain management she was told that we could not provide any prescription for narcotic pain medicine.   Final Clinical Impression(s) / ED Diagnoses Final diagnoses:  None    Rx / DC Orders ED Discharge Orders     None        Fredia Sorrow, MD 11/11/20 1730

## 2020-11-12 ENCOUNTER — Observation Stay (HOSPITAL_COMMUNITY)
Admission: EM | Admit: 2020-11-12 | Discharge: 2020-11-13 | Disposition: A | Discharge status: Home / Self Care | Payer: BC Managed Care – PPO | Attending: Internal Medicine | Admitting: Internal Medicine

## 2020-11-12 ENCOUNTER — Other Ambulatory Visit: Payer: Self-pay

## 2020-11-12 ENCOUNTER — Encounter (HOSPITAL_COMMUNITY): Payer: Self-pay | Admitting: *Deleted

## 2020-11-12 DIAGNOSIS — R1084 Generalized abdominal pain: Secondary | ICD-10-CM

## 2020-11-12 DIAGNOSIS — Z79899 Other long term (current) drug therapy: Secondary | ICD-10-CM | POA: Insufficient documentation

## 2020-11-12 DIAGNOSIS — Z20822 Contact with and (suspected) exposure to covid-19: Secondary | ICD-10-CM | POA: Diagnosis not present

## 2020-11-12 DIAGNOSIS — R112 Nausea with vomiting, unspecified: Secondary | ICD-10-CM

## 2020-11-12 DIAGNOSIS — R109 Unspecified abdominal pain: Principal | ICD-10-CM | POA: Insufficient documentation

## 2020-11-12 DIAGNOSIS — G894 Chronic pain syndrome: Secondary | ICD-10-CM | POA: Diagnosis not present

## 2020-11-12 DIAGNOSIS — I10 Essential (primary) hypertension: Secondary | ICD-10-CM | POA: Insufficient documentation

## 2020-11-12 DIAGNOSIS — R52 Pain, unspecified: Secondary | ICD-10-CM

## 2020-11-12 DIAGNOSIS — E876 Hypokalemia: Secondary | ICD-10-CM | POA: Diagnosis not present

## 2020-11-12 LAB — COMPREHENSIVE METABOLIC PANEL
ALT: 30 U/L (ref 0–44)
AST: 37 U/L (ref 15–41)
Albumin: 4.1 g/dL (ref 3.5–5.0)
Alkaline Phosphatase: 109 U/L (ref 38–126)
Anion gap: 14 (ref 5–15)
BUN: 15 mg/dL (ref 6–20)
CO2: 25 mmol/L (ref 22–32)
Calcium: 9.2 mg/dL (ref 8.9–10.3)
Chloride: 99 mmol/L (ref 98–111)
Creatinine, Ser: 0.9 mg/dL (ref 0.44–1.00)
GFR, Estimated: >60 mL/min (ref >60)
Glucose, Bld: 139 mg/dL — ABNORMAL HIGH (ref 70–99)
Potassium: 2.9 mmol/L — ABNORMAL LOW (ref 3.5–5.1)
Sodium: 138 mmol/L (ref 135–145)
Total Bilirubin: 0.8 mg/dL (ref 0.3–1.2)
Total Protein: 8.1 g/dL (ref 6.5–8.1)

## 2020-11-12 LAB — CBC WITH DIFFERENTIAL/PLATELET
Abs Immature Granulocytes: 0.04 10*3/uL (ref 0.00–0.07)
Basophils Absolute: 0.1 10*3/uL (ref 0.0–0.1)
Basophils Relative: 1 %
Eosinophils Absolute: 0.1 10*3/uL (ref 0.0–0.5)
Eosinophils Relative: 1 %
HCT: 42.7 % (ref 36.0–46.0)
Hemoglobin: 13.9 g/dL (ref 12.0–15.0)
Immature Granulocytes: 0 %
Lymphocytes Relative: 15 %
Lymphs Abs: 1.6 10*3/uL (ref 0.7–4.0)
MCH: 30.5 pg (ref 26.0–34.0)
MCHC: 32.6 g/dL (ref 30.0–36.0)
MCV: 93.6 fL (ref 80.0–100.0)
Monocytes Absolute: 0.5 10*3/uL (ref 0.1–1.0)
Monocytes Relative: 5 %
Neutro Abs: 8.3 10*3/uL — ABNORMAL HIGH (ref 1.7–7.7)
Neutrophils Relative %: 78 %
Platelets: 382 10*3/uL (ref 150–400)
RBC: 4.56 MIL/uL (ref 3.87–5.11)
RDW: 15.3 % (ref 11.5–15.5)
WBC: 10.6 10*3/uL — ABNORMAL HIGH (ref 4.0–10.5)
nRBC: 0 % (ref 0.0–0.2)

## 2020-11-12 LAB — RESP PANEL BY RT-PCR (FLU A&B, COVID) ARPGX2
Influenza A by PCR: NEGATIVE
Influenza B by PCR: NEGATIVE
SARS Coronavirus 2 by RT PCR: NEGATIVE

## 2020-11-12 LAB — LACTIC ACID, PLASMA
Lactic Acid, Venous: 1.4 mmol/L (ref 0.5–1.9)
Lactic Acid, Venous: 1.8 mmol/L (ref 0.5–1.9)

## 2020-11-12 MED ORDER — HYDROMORPHONE HCL 1 MG/ML IJ SOLN
1.0000 mg | Freq: Once | INTRAMUSCULAR | Status: AC
Start: 2020-11-12 — End: 2020-11-12
  Administered 2020-11-12: 1 mg via INTRAVENOUS
  Filled 2020-11-12: qty 1

## 2020-11-12 MED ORDER — POTASSIUM CHLORIDE IN NACL 40-0.9 MEQ/L-% IV SOLN
INTRAVENOUS | Status: DC
  Filled 2020-11-12 (×6): qty 1000

## 2020-11-12 MED ORDER — FAMOTIDINE 20 MG PO TABS
20.0000 mg | ORAL_TABLET | Freq: Two times a day (BID) | ORAL | Status: DC
  Administered 2020-11-12 – 2020-11-13 (×2): 20 mg via ORAL
  Filled 2020-11-12 (×2): qty 1

## 2020-11-12 MED ORDER — POTASSIUM CHLORIDE 10 MEQ/100ML IV SOLN
10.0000 meq | INTRAVENOUS | Status: AC
  Administered 2020-11-12 (×2): 10 meq via INTRAVENOUS
  Filled 2020-11-12 (×2): qty 100

## 2020-11-12 MED ORDER — NICOTINE 21 MG/24HR TD PT24
21.0000 mg | MEDICATED_PATCH | Freq: Every day | TRANSDERMAL | Status: DC
  Administered 2020-11-12 – 2020-11-13 (×2): 21 mg via TRANSDERMAL
  Filled 2020-11-12 (×2): qty 1

## 2020-11-12 MED ORDER — HYDROXYZINE HCL 25 MG PO TABS: 25.0000 mg | ORAL_TABLET | Freq: Three times a day (TID) | ORAL | Status: DC | PRN

## 2020-11-12 MED ORDER — LORAZEPAM 2 MG/ML IJ SOLN
1.0000 mg | Freq: Once | INTRAMUSCULAR | Status: AC
  Administered 2020-11-12: 1 mg via INTRAVENOUS
  Filled 2020-11-12: qty 1

## 2020-11-12 MED ORDER — TEMAZEPAM 15 MG PO CAPS
30.0000 mg | ORAL_CAPSULE | Freq: Every evening | ORAL | Status: DC | PRN
  Administered 2020-11-12: 30 mg via ORAL
  Filled 2020-11-12: qty 2

## 2020-11-12 MED ORDER — SODIUM CHLORIDE 0.9 % IV BOLUS
1000.0000 mL | Freq: Once | INTRAVENOUS | Status: AC
  Administered 2020-11-12: 1000 mL via INTRAVENOUS

## 2020-11-12 MED ORDER — ENOXAPARIN SODIUM 40 MG/0.4ML IJ SOSY
40.0000 mg | PREFILLED_SYRINGE | INTRAMUSCULAR | Status: DC
  Administered 2020-11-12: 40 mg via SUBCUTANEOUS
  Filled 2020-11-12: qty 0.4

## 2020-11-12 MED ORDER — HYDROMORPHONE HCL 1 MG/ML IJ SOLN
1.0000 mg | Freq: Once | INTRAMUSCULAR | Status: AC
  Administered 2020-11-12: 1 mg via INTRAVENOUS
  Filled 2020-11-12: qty 1

## 2020-11-12 MED ORDER — ONDANSETRON HCL 4 MG/2ML IJ SOLN
4.0000 mg | Freq: Four times a day (QID) | INTRAMUSCULAR | Status: DC
  Administered 2020-11-12 – 2020-11-13 (×3): 4 mg via INTRAVENOUS
  Filled 2020-11-12 (×3): qty 2

## 2020-11-12 MED ORDER — BOOST / RESOURCE BREEZE PO LIQD CUSTOM
1.0000 | Freq: Three times a day (TID) | ORAL | Status: DC
  Administered 2020-11-12 – 2020-11-13 (×2): 1 via ORAL

## 2020-11-12 MED ORDER — CLONAZEPAM 0.5 MG PO TABS: 0.5000 mg | ORAL_TABLET | Freq: Three times a day (TID) | ORAL | Status: DC | PRN

## 2020-11-12 MED ORDER — SODIUM CHLORIDE 0.9 % IV SOLN
12.5000 mg | Freq: Four times a day (QID) | INTRAVENOUS | Status: DC | PRN
  Filled 2020-11-12: qty 0.5

## 2020-11-12 MED ORDER — PANTOPRAZOLE SODIUM 40 MG IV SOLR
40.0000 mg | Freq: Two times a day (BID) | INTRAVENOUS | Status: DC
  Administered 2020-11-12 – 2020-11-13 (×2): 40 mg via INTRAVENOUS
  Filled 2020-11-12 (×2): qty 40

## 2020-11-12 MED ORDER — METOCLOPRAMIDE HCL 5 MG/ML IJ SOLN
10.0000 mg | Freq: Once | INTRAMUSCULAR | Status: AC
  Administered 2020-11-12: 10 mg via INTRAVENOUS
  Filled 2020-11-12: qty 2

## 2020-11-12 MED ORDER — HYDROMORPHONE HCL 1 MG/ML IJ SOLN
0.5000 mg | INTRAMUSCULAR | Status: DC | PRN
  Administered 2020-11-12 – 2020-11-13 (×6): 0.5 mg via INTRAVENOUS
  Filled 2020-11-12 (×6): qty 0.5

## 2020-11-12 MED ORDER — ONDANSETRON HCL 4 MG/2ML IJ SOLN
4.0000 mg | Freq: Once | INTRAMUSCULAR | Status: AC
  Administered 2020-11-12: 4 mg via INTRAVENOUS
  Filled 2020-11-12: qty 2

## 2020-11-12 NOTE — ED Triage Notes (Signed)
Pt reports she hasn't been able to take her BP meds in 2 days.

## 2020-11-12 NOTE — ED Provider Notes (Signed)
[facility] EMERGENCY DEPARTMENT Provider Note   CSN: [id number] Arrival date & time: 11/12/20  4742     History Chief Complaint  Patient presents with   Abdominal Pain    [name] is a 50 y.o. female.   Abdominal Pain Associated symptoms: nausea and vomiting   Associated symptoms: no chest pain, no chills, no diarrhea, no dysuria, no fatigue, no fever, no hematuria and no shortness of breath        [name] is a 50 y.o. female with a past medical history that includes HTN, colitis, IBS, chronic low back and abdominal pain.  She was seen in this ER yesterday for abdominal pain, nausea and vomiting and returns this morning for same.  She endorses improvement in her symptoms at time of discharge yesterday, but woke this morning with return of her nausea and vomiting.  She is followed by pain management, but has been able to keep down any of her medications in 2 days.  Abdominal pain this morning is described as constant and "all over." She believes her current symptoms are related to a flare of her IBS.  She has recently completed a course of antibiotics for colitis.  She denies fever, chills, diarrhea, hematemesis.    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/19/2018   Long term prescription opiate use 05/19/2018   Lumbar radiculopathy 02/04/2017   Lumbar degenerative disc disease 02/04/2017   Lumbar spondylosis 02/04/2017   Chronic low back pain with bilateral sciatica 02/04/2017   Chronic pain syndrome 02/04/2017   Lumbar sprain 02/04/2017   Chronic migraine without aura without status migrainosus, not intractable 02/04/2017    Past Surgical History:  Procedure Laterality Date   ABDOMINAL HYSTERECTOMY      BACK SURGERY     CHOLECYSTECTOMY     Complicated by bile leak requiring ERCP with temporary stenting   COLONOSCOPY WITH ESOPHAGOGASTRODUODENOSCOPY (EGD)  07/2009   [facility]: Op notes cannot be seen through care everywhere however pathology showed terminal ileum and random colon biopsies normal.  Stomach biopsy with gastric antral and fundic mucosa with reactive foveolar hyperplasia.  No active gastritis.  Stains negative for H. pylori.   ESOPHAGOGASTRODUODENOSCOPY  06/2007   [facility]: Normal esophagus, gastric mucosal abnormality with erythema, biopsy showed gastric antral and fundic mucosa with reactive foveolar hyperplasia, no gastritis, no H. pylori.   HAND SURGERY       OB History     Gravida  2   Para  2   Term  2   Preterm      AB      Living  2      SAB      IAB      Ectopic      Multiple      Live Births              Family History  Problem Relation Age of Onset   Cancer Other    Seizures Other    Stroke Other    Diabetes Other     Social History   Tobacco Use   Smoking  status: Every Day    Packs/day: 1.00    Years: 20.00    Pack years: 20.00    Types: Cigarettes   Smokeless tobacco: Never  Vaping Use   Vaping Use: Never used  Substance Use Topics   Alcohol use: No   Drug use: No    Home Medications Prior to Admission medications   Medication Sig Start Date End Date Taking? Authorizing Provider  clonazePAM (KLONOPIN) 0.5 MG tablet Take 0.5 mg by mouth daily as needed for anxiety.  09/12/19   [provider]  famotidine (PEPCID) 20 MG tablet Take 20 mg by mouth 2 (two) times daily. 09/27/20   [provider]  fluticasone (FLONASE) 50 MCG/ACT nasal spray Place 1 spray into both nostrils daily. Patient not taking: No sig reported 12/21/19   [provider]  hydrOXYzine (ATARAX/VISTARIL) 50 MG tablet Take by mouth 2 (two) times daily. 09/29/20   [provider]  metoprolol succinate (TOPROL-XL) 100 MG 24 hr  tablet Take 100 mg by mouth daily. Take with or immediately following a meal.    [provider]  ondansetron (ZOFRAN ODT) 4 MG disintegrating tablet Take 1 tablet (4 mg total) by mouth every 8 (eight) hours as needed for nausea or vomiting. 11/11/20   Fredia Sorrow, MD  ondansetron (ZOFRAN) 4 MG tablet TAKE 1 TABLET BY MOUTH EVERY 6 HOURS AS NEEDED FOR NAUSEA 09/12/20   Eloise Harman, DO  oxyCODONE-acetaminophen (PERCOCET/ROXICET) 5-325 MG tablet Take 1 tablet by mouth every 8 (eight) hours as needed for severe pain. 10/28/20 11/27/20  Gillis Santa, MD  polyethylene glycol-electrolytes (NULYTELY) 420 g solution As directed 09/12/20   Eloise Harman, DO  predniSONE (DELTASONE) 10 MG tablet Take 30 mg (3 tablets) by mouth daily for 7 days.  Take 20 mg (2 tablets) by mouth daily for 7 days.  Take 10 mg (1 tablet) by mouth daily for 7 days.  Take 5 mg (half a tablet) by mouth daily for 7 days. Patient not taking: No sig reported 08/19/20   Mahala Menghini, PA-C  temazepam (RESTORIL) 30 MG capsule Take 30 mg by mouth at bedtime as needed for sleep.    [provider]    Allergies    Aripiprazole, Buprenorphine, Cefaclor, Gabapentin, Lisinopril, Seroquel [quetiapine], Doxycycline, Ibuprofen, Levofloxacin, Macrobid [nitrofurantoin monohyd macro], Naproxen, and Penicillins  Review of Systems   Review of Systems  Constitutional:  Positive for appetite change. Negative for chills, fatigue and fever.  HENT:  Negative for trouble swallowing.   Respiratory:  Negative for shortness of breath.   Cardiovascular:  Negative for chest pain.  Gastrointestinal:  Positive for abdominal pain, nausea and vomiting. Negative for abdominal distention, blood in stool and diarrhea.  Genitourinary:  Negative for dysuria, flank pain and hematuria.  Musculoskeletal:  Negative for arthralgias, back pain, myalgias, neck pain and neck stiffness.  Skin:  Negative for rash.  Neurological:  Negative for  dizziness, weakness and numbness.  Hematological:  Does not bruise/bleed easily.  Psychiatric/Behavioral:  Negative for confusion.    Physical Exam Updated Vital Signs BP (!) 209/95 (BP Location: Left Arm)   Pulse 87   Temp 98.7 F (37.1 C) (Oral)   Resp (!) 24   Ht 5\' 1"  (1.549 m)   Wt 52.2 kg   SpO2 100%   BMI 21.73 kg/m   Physical Exam Vitals and nursing note reviewed.  Constitutional:      Appearance: She is well-developed. She is not ill-appearing or toxic-appearing.  HENT:     Mouth/Throat:     Comments: Mucus membranes are dry appearing Cardiovascular:     Rate and Rhythm: Normal rate and regular rhythm.  Pulmonary:     Effort: Pulmonary effort is normal. No respiratory distress.  Chest:     Chest wall: No tenderness.  Abdominal:     General: There is no distension.     Palpations: Abdomen is soft.     Tenderness: There is generalized abdominal tenderness.  Skin:    General: Skin is warm.     Capillary Refill: Capillary refill takes less than 2 seconds.     Findings: No rash.  Neurological:     General: No focal deficit present.     Mental Status: She is alert.    ED Results / Procedures / Treatments   Labs (all labs ordered are listed, but only abnormal results are displayed) Labs Reviewed  CBC WITH DIFFERENTIAL/PLATELET - Abnormal; Notable for the following components:      Result Value   WBC 10.6 (*)    Neutro Abs 8.3 (*)    All other components within normal limits  COMPREHENSIVE METABOLIC PANEL - Abnormal; Notable for the following components:   Potassium 2.9 (*)    Glucose, Bld 139 (*)    All other components within normal limits  RESP PANEL BY RT-PCR (FLU A&B, COVID) ARPGX2  URINALYSIS, ROUTINE W REFLEX MICROSCOPIC    EKG None  Radiology CT Abdomen Pelvis W Contrast  Result Date: 11/11/2020 CLINICAL DATA:  50 year old female with acute abdominal and pelvic pain. EXAM: CT ABDOMEN AND PELVIS WITH CONTRAST TECHNIQUE: Multidetector CT imaging  of the abdomen and pelvis was performed using the standard protocol following bolus administration of intravenous contrast. CONTRAST:  182mL OMNIPAQUE IOHEXOL 300 MG/ML  SOLN COMPARISON:  08/18/2020 CT and prior studies FINDINGS: Lower chest: No acute abnormality. Hepatobiliary: The liver is unremarkable. The patient is status post cholecystectomy. No biliary dilatation. Pancreas: Unremarkable Spleen: Unremarkable Adrenals/Urinary Tract: The kidneys, adrenal glands and bladder are unremarkable except for stable LEFT renal cysts. Stomach/Bowel: Stomach is within normal limits. Appendix appears unchanged with maximal diameter 6 mm. No evidence of bowel wall thickening, distention, or inflammatory changes. Vascular/Lymphatic: Aortic atherosclerosis. No enlarged abdominal or pelvic lymph nodes. Reproductive: Status post hysterectomy. No adnexal masses. Other: No ascites, pneumoperitoneum or focal collection. Musculoskeletal: No acute or suspicious bony abnormalities identified. IMPRESSION: 1. No evidence of acute abnormality. 2. Aortic Atherosclerosis (ICD10-I70.0). Electronically Signed   By: Margarette Canada M.D.   On: 11/11/2020 15:05    Procedures Procedures   Medications Ordered in ED Medications  sodium chloride 0.9 % bolus 1,000 mL (has no administration in time range)  HYDROmorphone (DILAUDID) injection 1 mg (has no administration in time range)  ondansetron (ZOFRAN) injection 4 mg (has no administration in time range)    ED Course  I have reviewed the triage vital signs and the nursing notes.  Pertinent labs & imaging results that were available during my care of the patient were reviewed by me and considered in my medical decision making (see chart for details).    MDM Rules/Calculators/A&P                          Patient seen here yesterday for abdominal pain, nausea and vomiting.  Work-up yesterday included labs and CT of the abdomen pelvis.  Results reviewed by me, and were without acute  abdominal findings. On arrival to the ER  this morning, patient moaning in pain holding an emesis bag without apparent vomiting.  Vomited at triage per nursing.  Here requesting pain control and antiemetic.  She has been unable to keep down any of her daily medications x2 days.  She is hypertensive on arrival and she has diffuse pain of her abdomen.  No peritoneal signs. We will address her pain here with IV pain medication and antiemetic and recheck labs.  Patient will also be given IV fluids.  I will repeat her labs today.  No indication for repeat imaging.    Patient has been given multiple doses of pain medications and antiemetics.  Symptoms have somewhat improved but not resolved.  She continues to complain of pain and nausea.  Labs today w/o significant change in white count.  She does have hypokalemia today, new from yesterday.  She has been given IV potassium here.  Given her continued pain and intractable vomiting, I feel patient would benefit from admission   Discussed findings with [facility] hospitalist, Dr. Carles Collet who agrees to admit.     Final Clinical Impression(s) / ED Diagnoses Final diagnoses:  Generalized abdominal pain  Hypokalemia    Rx / DC Orders ED Discharge Orders     None        Kem Parkinson, PA-C 11/15/20 0945    Wyvonnia Dusky, MD 11/15/20 1005

## 2020-11-12 NOTE — ED Triage Notes (Signed)
Pt c/o generalized abdominal pain and vomiting that started again this morning. Pt reports she was seen here yesterday and told it was IBS, but feels it is something else.

## 2020-11-12 NOTE — H&P (Signed)
History and Physical  [name]:[id number] DOB: [birth year] DOA: 11/12/2020   PCP: Vidal Schwalbe, MD   Patient coming from: Home  Chief Complaint: n/v abd pain  HPI:  [name] is a 50 y.o. female with medical history of hypertension, colitis, chronic back pain, chronic abdominal pain, and IBS presenting with worsening diffuse abdominal pain and nausea and vomiting that started on 11/10/2020.  The patient states that he has been doing fairly well up until 11/10/2020 when her symptoms began.  She visited the emergency department on 11/12/2019 and was treated symptomatically with improvement.  She was discharged home in stable condition.  However, she stated that " once the medications wore off" in the evening of 11/11/2020 she began having nausea, vomiting, and diffuse abdominal pain again.  Notably, the patient had a CT of the abdomen and pelvis on 7/22 which did not show any acute findings.  Specifically, there was no bowel wall thickening or inflammatory changes and her appendix was unchanged at 6 mm.  She has not had any fevers, chills, headache, chest pain, shortness of breath, coughing, hemoptysis, hematemesis, diarrhea, hematochezia, melena, dysuria, hematuria.  She states that she has not been able to keep down her home Percocet dosing.  The patient follows with pain management at [facility].  She received an SI injection with steroids on 10/31/2020. Notably, the patient was previously followed at [facility], but was lost to follow-up and has not seen them in nearly 10 years.  The patient was admitted to the hospital in early April 2022 with colitis.  There was concern for inflammatory bowel disease, but the patient left AGAINST MEDICAL ADVICE.  CT of the abdomen and pelvis on that day showed wall thickening of the colon with skip Lesions in the ascending, transverse, and descending colon.  There was also wall thickening noted at the terminal ileum.  Nevertheless, the  patient has not followed up with [facility] as an outpatient. In the emergency department, the patient was afebrile hemodynamically stable with oxygen saturation 97% on room air.  BMP showed a sodium 138, potassium 2.9, serum creatinine 0.90.  AST 37, ALT 30, alkaline phosphatase 109, total bilirubin 0.8.  WBC 10.6, hemoglobin 13.9, platelets 282,000.  The patient was given Ativan 1 mg IV and Dilaudid 1 mg x 2 and Zofran.  She was given metoclopramide 10 mg IV.  Patient was admitted for further evaluation and treatment secondary to recurrent intractable nausea and vomiting and abdominal pain.  Assessment/Plan: Intractable vomiting and abdominal pain -11/11/2020 CT abdomen and pelvis without any acute findings -Abdominal pain is out of proportion with physical exam findings and radiographic findings -Start IV fluids -Clear liquid diet -Start Zofran around-the-clock -Judicious opioids>>dilaudid 0.5 mg q 3 hrs prn -UA and urine culture -Urine drug screen -Lipase 32 -start protonix IV  Anxiety -Continue home dose Atarax and clonazepam  Chronic pain syndrome -PMDP reviewed--patient receives monthly clonazepam 0.5 mg, #90; Restoril 30 mg #30; and Percocet 5/325 #75 -Patient follows at the [facility]  Essential hypertension -Restart metoprolol succinate at lower dose  Hypokalemia -replete -check mag     Past Medical History:  Diagnosis Date   Chronic back pain    Colitis    Hypertension    Migraine    Plumbism    blood clot    Past Surgical History:  Procedure Laterality Date   ABDOMINAL HYSTERECTOMY     BACK SURGERY  CHOLECYSTECTOMY     Complicated by bile leak requiring ERCP with temporary stenting   COLONOSCOPY WITH ESOPHAGOGASTRODUODENOSCOPY (EGD)  07/2009   [facility]: Op notes cannot be seen through care everywhere however pathology showed terminal ileum and random colon biopsies normal.  Stomach biopsy with gastric antral and fundic  mucosa with reactive foveolar hyperplasia.  No active gastritis.  Stains negative for H. pylori.   ESOPHAGOGASTRODUODENOSCOPY  06/2007   [facility]: Normal esophagus, gastric mucosal abnormality with erythema, biopsy showed gastric antral and fundic mucosa with reactive foveolar hyperplasia, no gastritis, no H. pylori.   HAND SURGERY     Social History:  reports that she has been smoking cigarettes. She has a 20.00 pack-year smoking history. She has never used smokeless tobacco. She reports that she does not drink alcohol and does not use drugs.   Family History  Problem Relation Age of Onset   Cancer Other    Seizures Other    Stroke Other    Diabetes Other      Allergies  Allergen Reactions   Aripiprazole     Other reaction(s): seizure   Buprenorphine Other (See Comments)    Chest pain/ringing in hears and feet swelling   Cefaclor    Gabapentin Nausea Only    Vomiting  And dizzy   Lisinopril Nausea And Vomiting   Seroquel [Quetiapine]     Seizure (08/18/20)   Doxycycline Rash   Ibuprofen Swelling and Rash   Levofloxacin Rash    Itching   Macrobid [Nitrofurantoin Monohyd Macro] Rash   Naproxen Swelling and Rash   Penicillins Swelling and Rash          Prior to Admission medications   Medication Sig Start Date End Date Taking? Authorizing Provider  clonazePAM (KLONOPIN) 0.5 MG tablet Take 0.5 mg by mouth daily as needed for anxiety.  09/12/19   [provider]  famotidine (PEPCID) 20 MG tablet Take 20 mg by mouth 2 (two) times daily. 09/27/20   [provider]  fluticasone (FLONASE) 50 MCG/ACT nasal spray Place 1 spray into both nostrils daily. Patient not taking: No sig reported 12/21/19   [provider]  hydrOXYzine (ATARAX/VISTARIL) 50 MG tablet Take by mouth 2 (two) times daily. 09/29/20   [provider]  metoprolol succinate (TOPROL-XL) 100 MG 24 hr tablet Take 100 mg by mouth daily. Take with or immediately following a meal.     [provider]  ondansetron (ZOFRAN ODT) 4 MG disintegrating tablet Take 1 tablet (4 mg total) by mouth every 8 (eight) hours as needed for nausea or vomiting. 11/11/20   Fredia Sorrow, MD  ondansetron (ZOFRAN) 4 MG tablet TAKE 1 TABLET BY MOUTH EVERY 6 HOURS AS NEEDED FOR NAUSEA 09/12/20   Eloise Harman, DO  oxyCODONE-acetaminophen (PERCOCET/ROXICET) 5-325 MG tablet Take 1 tablet by mouth every 8 (eight) hours as needed for severe pain. 10/28/20 11/27/20  Gillis Santa, MD  polyethylene glycol-electrolytes (NULYTELY) 420 g solution As directed 09/12/20   Eloise Harman, DO  predniSONE (DELTASONE) 10 MG tablet Take 30 mg (3 tablets) by mouth daily for 7 days.  Take 20 mg (2 tablets) by mouth daily for 7 days.  Take 10 mg (1 tablet) by mouth daily for 7 days.  Take 5 mg (half a tablet) by mouth daily for 7 days. Patient not taking: No sig reported 08/19/20   Mahala Menghini, PA-C  temazepam (RESTORIL) 30 MG capsule Take 30 mg by mouth at bedtime as needed  for sleep.    [provider]    Review of Systems:  Constitutional:  No weight loss, night sweats, Fevers, chills, fatigue.  Head&Eyes: No headache.  No vision loss.  No eye pain or scotoma ENT:  No Difficulty swallowing,Tooth/dental problems,Sore throat,  No ear ache, post nasal drip,  Cardio-vascular:  No chest pain, Orthopnea, PND, swelling in lower extremities,  dizziness, palpitations  GI:  No diarrhea, loss of appetite, hematochezia, melena, heartburn, indigestion, Resp:  No shortness of breath with exertion or at rest. No cough. No coughing up of blood .No wheezing.No chest wall deformity  Skin:  no rash or lesions.  GU:  no dysuria, change in color of urine, no urgency or frequency. No flank pain.  Musculoskeletal:  No joint pain or swelling. No decreased range of motion. No back pain.  Psych:  No change in mood or affect. No depression or anxiety. Neurologic: No headache, no dysesthesia, no focal  weakness, no vision loss. No syncope  Physical Exam: Vitals:   11/12/20 1200 11/12/20 1222 11/12/20 1230 11/12/20 1300  BP: 117/75 117/75 129/76 133/78  Pulse: 88 88 87 91  Resp:  (!) 22    Temp:      TempSrc:      SpO2: 94% 94% 94% 97%  Weight:      Height:       General:  A&O x 3, NAD, nontoxic, pleasant/cooperative Head/Eye: No conjunctival hemorrhage, no icterus, [place]/AT, No nystagmus ENT:  No icterus,  No thrush, good dentition, no pharyngeal exudate Neck:  No masses, no lymphadenpathy, no bruits CV:  RRR, no rub, no gallop, no S3 Lung:  CTAB, good air movement, no wheeze, no rhonchi Abdomen: soft/diffusely tender to feather touch, +BS, nondistended, no peritoneal signs Ext: No cyanosis, No rashes, No petechiae, No lymphangitis, No edema Neuro: CNII-XII intact, strength 4/5 in bilateral upper and lower extremities, no dysmetria  Labs on Admission:  Basic Metabolic Panel: Recent Labs  Lab 11/11/20 1246 11/12/20 0930  NA 143 138  K 4.3 2.9*  CL 104 99  CO2 28 25  GLUCOSE 130* 139*  BUN 24* 15  CREATININE 0.92 0.90  CALCIUM 8.7* 9.2   Liver Function Tests: Recent Labs  Lab 11/11/20 1246 11/12/20 0930  AST 32 37  ALT 27 30  ALKPHOS 85 109  BILITOT 0.2* 0.8  PROT 7.0 8.1  ALBUMIN 3.5 4.1   Recent Labs  Lab 11/11/20 1246  LIPASE 32   No results for input(s): AMMONIA in the last 168 hours. CBC: Recent Labs  Lab 11/11/20 1246 11/12/20 0930  WBC 10.7* 10.6*  NEUTROABS 9.0* 8.3*  HGB 13.2 13.9  HCT 41.1 42.7  MCV 96.3 93.6  PLT 316 382   Coagulation Profile: No results for input(s): INR, PROTIME in the last 168 hours. Cardiac Enzymes: No results for input(s): CKTOTAL, CKMB, CKMBINDEX, TROPONINI in the last 168 hours. BNP: Invalid input(s): POCBNP CBG: No results for input(s): GLUCAP in the last 168 hours. Urine analysis:    Component Value Date/Time   COLORURINE YELLOW 08/18/2020 0456   APPEARANCEUR CLEAR 08/18/2020 0456   LABSPEC <1.005 (L)  08/18/2020 0456   PHURINE 5.5 08/18/2020 0456   GLUCOSEU NEGATIVE 08/18/2020 0456   HGBUR NEGATIVE 08/18/2020 0456   BILIRUBINUR NEGATIVE 08/18/2020 0456   KETONESUR NEGATIVE 08/18/2020 0456   PROTEINUR NEGATIVE 08/18/2020 0456   UROBILINOGEN 0.2 03/24/2013 1422   NITRITE NEGATIVE 08/18/2020 0456   LEUKOCYTESUR NEGATIVE 08/18/2020 0456   Sepsis Labs: @LABRCNTIP (procalcitonin:4,lacticidven:4) )No  results found for this or any previous visit (from the past 240 hour(s)).   Radiological Exams on Admission: CT Abdomen Pelvis W Contrast  Result Date: 11/11/2020 CLINICAL DATA:  50 year old female with acute abdominal and pelvic pain. EXAM: CT ABDOMEN AND PELVIS WITH CONTRAST TECHNIQUE: Multidetector CT imaging of the abdomen and pelvis was performed using the standard protocol following bolus administration of intravenous contrast. CONTRAST:  125mL OMNIPAQUE IOHEXOL 300 MG/ML  SOLN COMPARISON:  08/18/2020 CT and prior studies FINDINGS: Lower chest: No acute abnormality. Hepatobiliary: The liver is unremarkable. The patient is status post cholecystectomy. No biliary dilatation. Pancreas: Unremarkable Spleen: Unremarkable Adrenals/Urinary Tract: The kidneys, adrenal glands and bladder are unremarkable except for stable LEFT renal cysts. Stomach/Bowel: Stomach is within normal limits. Appendix appears unchanged with maximal diameter 6 mm. No evidence of bowel wall thickening, distention, or inflammatory changes. Vascular/Lymphatic: Aortic atherosclerosis. No enlarged abdominal or pelvic lymph nodes. Reproductive: Status post hysterectomy. No adnexal masses. Other: No ascites, pneumoperitoneum or focal collection. Musculoskeletal: No acute or suspicious bony abnormalities identified. IMPRESSION: 1. No evidence of acute abnormality. 2. Aortic Atherosclerosis (ICD10-I70.0). Electronically Signed   By: Margarette Canada M.D.   On: 11/11/2020 15:05    EKG: Independently reviewed.     Time spent:60 minutes Code  Status:   FULL Family Communication:  No Family at bedside Disposition Plan: expect 1-2 day hospitalization Consults called: none DVT Prophylaxis:  Lovenox  Orson Eva, DO  [facility] Pager [phone]  If 7PM-7AM, please contact night-coverage [email or web address] Password [facility] 11/12/2020, 3:07 PM

## 2020-11-13 DIAGNOSIS — G894 Chronic pain syndrome: Secondary | ICD-10-CM | POA: Diagnosis not present

## 2020-11-13 DIAGNOSIS — R52 Pain, unspecified: Secondary | ICD-10-CM | POA: Diagnosis not present

## 2020-11-13 DIAGNOSIS — E876 Hypokalemia: Secondary | ICD-10-CM | POA: Diagnosis not present

## 2020-11-13 DIAGNOSIS — R112 Nausea with vomiting, unspecified: Secondary | ICD-10-CM | POA: Diagnosis not present

## 2020-11-13 LAB — RAPID URINE DRUG SCREEN, HOSP PERFORMED
Amphetamines: NOT DETECTED
Barbiturates: NOT DETECTED
Benzodiazepines: POSITIVE — AB
Cocaine: NOT DETECTED
Opiates: POSITIVE — AB
Tetrahydrocannabinol: NOT DETECTED

## 2020-11-13 LAB — URINALYSIS, ROUTINE W REFLEX MICROSCOPIC
Bilirubin Urine: NEGATIVE
Glucose, UA: NEGATIVE mg/dL
Hgb urine dipstick: NEGATIVE
Ketones, ur: NEGATIVE mg/dL
Leukocytes,Ua: NEGATIVE
Nitrite: NEGATIVE
Protein, ur: NEGATIVE mg/dL
Specific Gravity, Urine: 1.01 (ref 1.005–1.030)
pH: 6 (ref 5.0–8.0)

## 2020-11-13 MED ORDER — PROMETHAZINE HCL 12.5 MG PO TABS: 12.5000 mg | ORAL_TABLET | Freq: Four times a day (QID) | ORAL | 0 refills | Status: DC | PRN

## 2020-11-13 MED ORDER — FAMOTIDINE 20 MG PO TABS: 20.0000 mg | ORAL_TABLET | Freq: Two times a day (BID) | ORAL | 1 refills | Status: DC

## 2020-11-13 NOTE — Final Progress Note (Signed)
Patient discharged home with medication instructions and personal belongings, picked up by husband in personal vehicle. IV removed from RFA without difficulty ,

## 2020-11-13 NOTE — Discharge Summary (Signed)
Physician Discharge Summary  [name] XHB:[id number] DOB: 1971-02-25 DOA: 11/12/2020  PCP: Vidal Schwalbe, MD  Admit date: 11/12/2020 Discharge date: 11/13/2020  Admitted From: Home Disposition:  Home   Recommendations for Outpatient Follow-up:  Follow up with PCP in 1-2 weeks Please obtain BMP/CBC in one week    Discharge Condition: Stable CODE STATUS: FULL Diet recommendation: soft   Brief/Interim Summary: 50 y.o. female with medical history of hypertension, colitis, chronic back pain, chronic abdominal pain, and IBS presenting with worsening diffuse abdominal pain and nausea and vomiting that started on 11/10/2020.  The patient states that he has been doing fairly well up until 11/10/2020 when her symptoms began.  She visited the emergency department on 11/12/2019 and was treated symptomatically with improvement.  She was discharged home in stable condition.  However, she stated that " once the medications wore off" in the evening of 11/11/2020 she began having nausea, vomiting, and diffuse abdominal pain again.  Notably, the patient had a CT of the abdomen and pelvis on 7/22 which did not show any acute findings.  Specifically, there was no bowel wall thickening or inflammatory changes and her appendix was unchanged at 6 mm.  She has not had any fevers, chills, headache, chest pain, shortness of breath, coughing, hemoptysis, hematemesis, diarrhea, hematochezia, melena, dysuria, hematuria.  She states that she has not been able to keep down her home Percocet dosing.  The patient follows with pain management at [facility].  She received an SI injection with steroids on 10/31/2020. Notably, the patient was previously followed at [facility], but was lost to follow-up and has not seen them in nearly 10 years.  The patient was admitted to the hospital in early April 2022 with colitis.  There was concern for inflammatory bowel disease, but the patient left AGAINST MEDICAL ADVICE.  CT  of the abdomen and pelvis on that day showed wall thickening of the colon with skip Lesions in the ascending, transverse, and descending colon.  There was also wall thickening noted at the terminal ileum.  Nevertheless, the patient has not followed up with [facility] as an outpatient. In the emergency department, the patient was afebrile hemodynamically stable with oxygen saturation 97% on room air.  BMP showed a sodium 138, potassium 2.9, serum creatinine 0.90.  AST 37, ALT 30, alkaline phosphatase 109, total bilirubin 0.8.  WBC 10.6, hemoglobin 13.9, platelets 282,000.  The patient was given Ativan 1 mg IV and Dilaudid 1 mg x 2 and Zofran.  She was given metoclopramide 10 mg IV.  Patient was admitted for further evaluation and treatment secondary to recurrent intractable nausea and vomiting and abdominal pain.  Discharge Diagnoses:  Intractable vomiting and abdominal pain -11/11/2020 CT abdomen and pelvis without any acute findings -Abdominal pain is out of proportion with physical exam findings and radiographic findings -Started IV fluids -overall improved; pain at least 40% better at time of d/c -Clear liquid diet>>advanced to soft diet which patient tolerated -Started Zofran around-the-clock -d/c home with phenergan 12.5 mg, #12, no RF -Judicious opioids>>dilaudid 0.5 mg q 3 hrs prn -UA--no pyuria -Urine drug screen--shows opiates and benzos -Lipase 32 -started protonix IV during hospitalization   Anxiety -Continue home dose Atarax and clonazepam   Chronic pain syndrome -PMDP reviewed--patient receives monthly clonazepam 0.5 mg, #90; Restoril 30 mg #30; and Percocet 5/325 #75 -Patient follows at the [facility] pain clinic   Essential hypertension -Restart metoprolol succinate at lower dose   Hypokalemia -  repleted     Discharge Instructions   Allergies as of 11/13/2020       Reactions   Buprenorphine Other (See Comments)   Chest pain/ringing in ears  and feet swelling   Penicillins Swelling, Rash      Aripiprazole Other (See Comments)   Other reaction(s): seizure   Seroquel [quetiapine] Other (See Comments)   Seizure (08/18/20)   Cefaclor Other (See Comments)   Unknown reaction   Doxycycline Rash   Gabapentin Nausea And Vomiting   Vomiting  And dizzy   Ibuprofen Swelling, Rash   Levofloxacin Rash   Itching   Lisinopril Nausea And Vomiting   Macrobid [nitrofurantoin Monohyd Macro] Rash   Naproxen Swelling, Rash        Medication List     STOP taking these medications    ondansetron 4 MG disintegrating tablet Commonly known as: Zofran ODT   ondansetron 4 MG tablet Commonly known as: ZOFRAN   polyethylene glycol-electrolytes 420 g solution Commonly known as: NuLYTELY   predniSONE 10 MG tablet Commonly known as: DELTASONE       TAKE these medications    clonazePAM 0.5 MG tablet Commonly known as: KLONOPIN Take 0.5 mg by mouth daily as needed for anxiety.   famotidine 20 MG tablet Commonly known as: PEPCID Take 1 tablet (20 mg total) by mouth 2 (two) times daily.   hydrOXYzine 50 MG tablet Commonly known as: ATARAX/VISTARIL Take by mouth 2 (two) times daily.   metoprolol succinate 100 MG 24 hr tablet Commonly known as: TOPROL-XL Take 100 mg by mouth daily. Take with or immediately following a meal.   oxyCODONE-acetaminophen 5-325 MG tablet Commonly known as: PERCOCET/ROXICET Take 1 tablet by mouth every 8 (eight) hours as needed for severe pain.   promethazine 12.5 MG tablet Commonly known as: PHENERGAN Take 1 tablet (12.5 mg total) by mouth every 6 (six) hours as needed for nausea or vomiting.   temazepam 30 MG capsule Commonly known as: RESTORIL Take 30 mg by mouth at bedtime as needed for sleep.        Allergies  Allergen Reactions   Buprenorphine Other (See Comments)    Chest pain/ringing in ears and feet swelling   Penicillins Swelling and Rash        Aripiprazole Other (See  Comments)    Other reaction(s): seizure   Seroquel [Quetiapine] Other (See Comments)    Seizure (08/18/20)   Cefaclor Other (See Comments)    Unknown reaction   Doxycycline Rash   Gabapentin Nausea And Vomiting    Vomiting  And dizzy   Ibuprofen Swelling and Rash   Levofloxacin Rash    Itching   Lisinopril Nausea And Vomiting   Macrobid [Nitrofurantoin Monohyd Macro] Rash   Naproxen Swelling and Rash    Consultations: none   Procedures/Studies: CT Abdomen Pelvis W Contrast  Result Date: 11/11/2020 CLINICAL DATA:  50 year old female with acute abdominal and pelvic pain. EXAM: CT ABDOMEN AND PELVIS WITH CONTRAST TECHNIQUE: Multidetector CT imaging of the abdomen and pelvis was performed using the standard protocol following bolus administration of intravenous contrast. CONTRAST:  166mL OMNIPAQUE IOHEXOL 300 MG/ML  SOLN COMPARISON:  08/18/2020 CT and prior studies FINDINGS: Lower chest: No acute abnormality. Hepatobiliary: The liver is unremarkable. The patient is status post cholecystectomy. No biliary dilatation. Pancreas: Unremarkable Spleen: Unremarkable Adrenals/Urinary Tract: The kidneys, adrenal glands and bladder are unremarkable except for stable LEFT renal cysts. Stomach/Bowel: Stomach is within normal limits. Appendix appears unchanged with maximal diameter 6  mm. No evidence of bowel wall thickening, distention, or inflammatory changes. Vascular/Lymphatic: Aortic atherosclerosis. No enlarged abdominal or pelvic lymph nodes. Reproductive: Status post hysterectomy. No adnexal masses. Other: No ascites, pneumoperitoneum or focal collection. Musculoskeletal: No acute or suspicious bony abnormalities identified. IMPRESSION: 1. No evidence of acute abnormality. 2. Aortic Atherosclerosis (ICD10-I70.0). Electronically Signed   By: Margarette Canada M.D.   On: 11/11/2020 15:05   DG PAIN CLINIC C-ARM 1-60 MIN NO REPORT  Result Date: 10/31/2020 Fluoro was used, but no Radiologist interpretation will  be provided. Please refer to "NOTES" tab for provider progress note.       Discharge Exam: Vitals:   11/12/20 2356 11/13/20 0336  BP: 129/76 (!) 144/87  Pulse: 91 78  Resp: 18 16  Temp: 98.8 F (37.1 C) 98.3 F (36.8 C)  SpO2: 98% 98%   Vitals:   11/12/20 1632 11/12/20 2058 11/12/20 2356 11/13/20 0336  BP: 132/74 129/68 129/76 (!) 144/87  Pulse: 85 94 91 78  Resp: 18 18 18 16   Temp: 98.8 F (37.1 C) 99 F (37.2 C) 98.8 F (37.1 C) 98.3 F (36.8 C)  TempSrc: Oral Oral Oral Oral  SpO2: 100% 97% 98% 98%  Weight:      Height:        General: Pt is alert, awake, not in acute distress Cardiovascular: RRR, S1/S2 +, no rubs, no gallops Respiratory: CTA bilaterally, no wheezing, no rhonchi Abdominal: Soft, diffuse tenderness to feather touch ND, bowel sounds + Extremities: no edema, no cyanosis   The results of significant diagnostics from this hospitalization (including imaging, microbiology, ancillary and laboratory) are listed below for reference.    Significant Diagnostic Studies: CT Abdomen Pelvis W Contrast  Result Date: 11/11/2020 CLINICAL DATA:  50 year old female with acute abdominal and pelvic pain. EXAM: CT ABDOMEN AND PELVIS WITH CONTRAST TECHNIQUE: Multidetector CT imaging of the abdomen and pelvis was performed using the standard protocol following bolus administration of intravenous contrast. CONTRAST:  131mL OMNIPAQUE IOHEXOL 300 MG/ML  SOLN COMPARISON:  08/18/2020 CT and prior studies FINDINGS: Lower chest: No acute abnormality. Hepatobiliary: The liver is unremarkable. The patient is status post cholecystectomy. No biliary dilatation. Pancreas: Unremarkable Spleen: Unremarkable Adrenals/Urinary Tract: The kidneys, adrenal glands and bladder are unremarkable except for stable LEFT renal cysts. Stomach/Bowel: Stomach is within normal limits. Appendix appears unchanged with maximal diameter 6 mm. No evidence of bowel wall thickening, distention, or inflammatory  changes. Vascular/Lymphatic: Aortic atherosclerosis. No enlarged abdominal or pelvic lymph nodes. Reproductive: Status post hysterectomy. No adnexal masses. Other: No ascites, pneumoperitoneum or focal collection. Musculoskeletal: No acute or suspicious bony abnormalities identified. IMPRESSION: 1. No evidence of acute abnormality. 2. Aortic Atherosclerosis (ICD10-I70.0). Electronically Signed   By: Margarette Canada M.D.   On: 11/11/2020 15:05   DG PAIN CLINIC C-ARM 1-60 MIN NO REPORT  Result Date: 10/31/2020 Fluoro was used, but no Radiologist interpretation will be provided. Please refer to "NOTES" tab for provider progress note.   Microbiology: Recent Results (from the past 240 hour(s))  Resp Panel by RT-PCR (Flu A&B, Covid) Nasopharyngeal Swab     Status: None   Collection Time: 11/12/20  1:57 PM   Specimen: Nasopharyngeal Swab; Nasopharyngeal(NP) swabs in vial transport medium  Result Value Ref Range Status   SARS Coronavirus 2 by RT PCR NEGATIVE NEGATIVE Final    Comment: (NOTE) SARS-CoV-2 target nucleic acids are NOT DETECTED.  The SARS-CoV-2 RNA is generally detectable in upper respiratory specimens during the acute phase of infection. The lowest  concentration of SARS-CoV-2 viral copies this assay can detect is 138 copies/mL. A negative result does not preclude SARS-Cov-2 infection and should not be used as the sole basis for treatment or other patient management decisions. A negative result may occur with  improper specimen collection/handling, submission of specimen other than nasopharyngeal swab, presence of viral mutation(s) within the areas targeted by this assay, and inadequate number of viral copies(<138 copies/mL). A negative result must be combined with clinical observations, patient history, and epidemiological information. The expected result is Negative.  Fact Sheet for Patients:  EntrepreneurPulse.com.au  Fact Sheet for Healthcare Providers:   IncredibleEmployment.be  This test is no t yet approved or cleared by the [place] FDA and  has been authorized for detection and/or diagnosis of SARS-CoV-2 by FDA under an Emergency Use Authorization (EUA). This EUA will remain  in effect (meaning this test can be used) for the duration of the COVID-19 declaration under Section 564(b)(1) of the Act, 21 U.S.C.section 360bbb-3(b)(1), unless the authorization is terminated  or revoked sooner.       Influenza A by PCR NEGATIVE NEGATIVE Final   Influenza B by PCR NEGATIVE NEGATIVE Final    Comment: (NOTE) The Xpert Xpress SARS-CoV-2/FLU/RSV plus assay is intended as an aid in the diagnosis of influenza from Nasopharyngeal swab specimens and should not be used as a sole basis for treatment. Nasal washings and aspirates are unacceptable for Xpert Xpress SARS-CoV-2/FLU/RSV testing.  Fact Sheet for Patients: EntrepreneurPulse.com.au  Fact Sheet for Healthcare Providers: IncredibleEmployment.be  This test is not yet approved or cleared by the [place] FDA and has been authorized for detection and/or diagnosis of SARS-CoV-2 by FDA under an Emergency Use Authorization (EUA). This EUA will remain in effect (meaning this test can be used) for the duration of the COVID-19 declaration under Section 564(b)(1) of the Act, 21 U.S.C. section 360bbb-3(b)(1), unless the authorization is terminated or revoked.  Performed at [facility], [address], [place], [place] [address]      Labs: Basic Metabolic Panel: Recent Labs  Lab 11/11/20 1246 11/12/20 0930 11/13/20 0420  NA 143 138 137  K 4.3 2.9* 4.5  CL 104 99 108  CO2 28 25 23   GLUCOSE 130* 139* 87  BUN 24* 15 10  CREATININE 0.92 0.90 0.68  CALCIUM 8.7* 9.2 7.7*   Liver Function Tests: Recent Labs  Lab 11/11/20 1246 11/12/20 0930  AST 32 37  ALT 27 30  ALKPHOS 85 109  BILITOT 0.2* 0.8  PROT 7.0 8.1  ALBUMIN  3.5 4.1   Recent Labs  Lab 11/11/20 1246  LIPASE 32   No results for input(s): AMMONIA in the last 168 hours. CBC: Recent Labs  Lab 11/11/20 1246 11/12/20 0930 11/13/20 0421  WBC 10.7* 10.6* 5.9  NEUTROABS 9.0* 8.3*  --   HGB 13.2 13.9 10.1*  HCT 41.1 42.7 31.6*  MCV 96.3 93.6 96.9  PLT 316 382 234   Cardiac Enzymes: No results for input(s): CKTOTAL, CKMB, CKMBINDEX, TROPONINI in the last 168 hours. BNP: Invalid input(s): POCBNP CBG: No results for input(s): GLUCAP in the last 168 hours.  Time coordinating discharge:  36 minutes  Signed:  Orson Eva, DO [facility] Pager: [phone] 11/13/2020, 9:38 AM

## 2020-11-13 NOTE — Plan of Care (Signed)

## 2020-11-14 ENCOUNTER — Emergency Department (HOSPITAL_COMMUNITY)
Admission: EM | Admit: 2020-11-14 | Discharge: 2020-11-14 | Disposition: A | Discharge status: Home / Self Care | Payer: BC Managed Care – PPO | Attending: Emergency Medicine | Admitting: Emergency Medicine

## 2020-11-14 ENCOUNTER — Other Ambulatory Visit: Payer: Self-pay

## 2020-11-14 ENCOUNTER — Encounter (HOSPITAL_COMMUNITY): Payer: Self-pay | Admitting: *Deleted

## 2020-11-14 DIAGNOSIS — R1084 Generalized abdominal pain: Secondary | ICD-10-CM

## 2020-11-14 DIAGNOSIS — R112 Nausea with vomiting, unspecified: Secondary | ICD-10-CM | POA: Diagnosis present

## 2020-11-14 DIAGNOSIS — Z79899 Other long term (current) drug therapy: Secondary | ICD-10-CM | POA: Insufficient documentation

## 2020-11-14 DIAGNOSIS — I10 Essential (primary) hypertension: Secondary | ICD-10-CM | POA: Insufficient documentation

## 2020-11-14 DIAGNOSIS — F17210 Nicotine dependence, cigarettes, uncomplicated: Secondary | ICD-10-CM | POA: Diagnosis not present

## 2020-11-14 LAB — COMPREHENSIVE METABOLIC PANEL
ALT: 26 U/L (ref 0–44)
ALT: 25 U/L (ref 0–44)
AST: 31 U/L (ref 15–41)
AST: 29 U/L (ref 15–41)
Albumin: 4 g/dL (ref 3.5–5.0)
Albumin: 3.9 g/dL (ref 3.5–5.0)
Alkaline Phosphatase: 111 U/L (ref 38–126)
Alkaline Phosphatase: 108 U/L (ref 38–126)
Anion gap: 11 (ref 5–15)
Anion gap: 13 (ref 5–15)
BUN: 11 mg/dL (ref 6–20)
BUN: 11 mg/dL (ref 6–20)
CO2: 25 mmol/L (ref 22–32)
CO2: 22 mmol/L (ref 22–32)
Calcium: 9.2 mg/dL (ref 8.9–10.3)
Calcium: 9.3 mg/dL (ref 8.9–10.3)
Chloride: 103 mmol/L (ref 98–111)
Chloride: 104 mmol/L (ref 98–111)
Creatinine, Ser: 0.83 mg/dL (ref 0.44–1.00)
Creatinine, Ser: 0.75 mg/dL (ref 0.44–1.00)
GFR, Estimated: >60 mL/min (ref >60)
GFR, Estimated: >60 mL/min (ref >60)
Glucose, Bld: 131 mg/dL — ABNORMAL HIGH (ref 70–99)
Glucose, Bld: 136 mg/dL — ABNORMAL HIGH (ref 70–99)
Potassium: 3.8 mmol/L (ref 3.5–5.1)
Potassium: 3.7 mmol/L (ref 3.5–5.1)
Sodium: 139 mmol/L (ref 135–145)
Sodium: 139 mmol/L (ref 135–145)
Total Bilirubin: 0.6 mg/dL (ref 0.3–1.2)
Total Bilirubin: 0.6 mg/dL (ref 0.3–1.2)
Total Protein: 8.2 g/dL — ABNORMAL HIGH (ref 6.5–8.1)
Total Protein: 7.9 g/dL (ref 6.5–8.1)

## 2020-11-14 LAB — CBC
HCT: 31.6 % — ABNORMAL LOW (ref 36.0–46.0)
HCT: 48.7 % — ABNORMAL HIGH (ref 36.0–46.0)
HCT: 43 % (ref 36.0–46.0)
Hemoglobin: 10.1 g/dL — ABNORMAL LOW (ref 12.0–15.0)
Hemoglobin: 15.8 g/dL — ABNORMAL HIGH (ref 12.0–15.0)
Hemoglobin: 14.1 g/dL (ref 12.0–15.0)
MCH: 31 pg (ref 26.0–34.0)
MCH: 30.6 pg (ref 26.0–34.0)
MCH: 30.8 pg (ref 26.0–34.0)
MCHC: 32 g/dL (ref 30.0–36.0)
MCHC: 32.4 g/dL (ref 30.0–36.0)
MCHC: 32.8 g/dL (ref 30.0–36.0)
MCV: 96.9 fL (ref 80.0–100.0)
MCV: 94.4 fL (ref 80.0–100.0)
MCV: 93.9 fL (ref 80.0–100.0)
Platelets: 234 10*3/uL (ref 150–400)
Platelets: 425 10*3/uL — ABNORMAL HIGH (ref 150–400)
Platelets: 426 10*3/uL — ABNORMAL HIGH (ref 150–400)
RBC: 3.26 MIL/uL — ABNORMAL LOW (ref 3.87–5.11)
RBC: 5.16 MIL/uL — ABNORMAL HIGH (ref 3.87–5.11)
RBC: 4.58 MIL/uL (ref 3.87–5.11)
RDW: 15.4 % (ref 11.5–15.5)
RDW: 15.1 % (ref 11.5–15.5)
RDW: 14.8 % (ref 11.5–15.5)
WBC: 5.9 10*3/uL (ref 4.0–10.5)
WBC: 13.5 10*3/uL — ABNORMAL HIGH (ref 4.0–10.5)
WBC: 13.4 10*3/uL — ABNORMAL HIGH (ref 4.0–10.5)
nRBC: 0 % (ref 0.0–0.2)
nRBC: 0 % (ref 0.0–0.2)
nRBC: 0 % (ref 0.0–0.2)

## 2020-11-14 LAB — BASIC METABOLIC PANEL
Anion gap: 6 (ref 5–15)
BUN: 10 mg/dL (ref 6–20)
CO2: 23 mmol/L (ref 22–32)
Calcium: 7.7 mg/dL — ABNORMAL LOW (ref 8.9–10.3)
Chloride: 108 mmol/L (ref 98–111)
Creatinine, Ser: 0.68 mg/dL (ref 0.44–1.00)
GFR, Estimated: >60 mL/min (ref >60)
Glucose, Bld: 87 mg/dL (ref 70–99)
Potassium: 4.5 mmol/L (ref 3.5–5.1)
Sodium: 137 mmol/L (ref 135–145)

## 2020-11-14 LAB — URINE CULTURE: Culture: <10000 — AB

## 2020-11-14 LAB — LIPASE, BLOOD: Lipase: 25 U/L (ref 11–51)

## 2020-11-14 MED ORDER — ONDANSETRON 4 MG PO TBDP
4.0000 mg | ORAL_TABLET | Freq: Once | ORAL | Status: AC | PRN
  Administered 2020-11-14: 4 mg via ORAL
  Filled 2020-11-14: qty 1

## 2020-11-14 MED ORDER — PANTOPRAZOLE SODIUM 40 MG IV SOLR
40.0000 mg | Freq: Once | INTRAVENOUS | Status: AC
  Administered 2020-11-14: 40 mg via INTRAVENOUS
  Filled 2020-11-14: qty 40

## 2020-11-14 MED ORDER — METOPROLOL SUCCINATE ER 50 MG PO TB24: 100.0000 mg | ORAL_TABLET | Freq: Every day | ORAL | Status: DC

## 2020-11-14 MED ORDER — SODIUM CHLORIDE 0.9 % IV BOLUS
1000.0000 mL | Freq: Once | INTRAVENOUS | Status: AC
  Administered 2020-11-14: 1000 mL via INTRAVENOUS

## 2020-11-14 MED ORDER — HALOPERIDOL LACTATE 5 MG/ML IJ SOLN
2.0000 mg | Freq: Once | INTRAMUSCULAR | Status: AC
  Administered 2020-11-14: 2 mg via INTRAVENOUS
  Filled 2020-11-14: qty 1

## 2020-11-14 MED ORDER — HYDROMORPHONE HCL 1 MG/ML IJ SOLN
1.0000 mg | Freq: Once | INTRAMUSCULAR | Status: AC
  Administered 2020-11-14: 1 mg via INTRAVENOUS
  Filled 2020-11-14: qty 1

## 2020-11-14 MED ORDER — PROMETHAZINE HCL 12.5 MG RE SUPP: 12.5000 mg | Freq: Four times a day (QID) | RECTAL | 0 refills | Status: DC | PRN

## 2020-11-14 NOTE — ED Notes (Signed)
Ice water given to patient at this time.

## 2020-11-14 NOTE — Discharge Instructions (Addendum)
Your work-up today was reassuring.  Stop taking the promethazine tablets and start the suppository to help with your nausea and vomiting.  Frequent small sips of clear fluids.  It is important that you take your daily medications as directed.  Follow-up with your primary care provider for recheck.  Also, follow-up with your GI provider this week.

## 2020-11-14 NOTE — ED Notes (Signed)
Patient tolerated water

## 2020-11-14 NOTE — ED Provider Notes (Signed)
[facility] EMERGENCY DEPARTMENT Provider Note   CSN: [id number] Arrival date & time: 11/14/20  0941     History Chief Complaint  Patient presents with   Emesis    [name] is a 50 y.o. female.   Emesis Associated symptoms: abdominal pain   Associated symptoms: no chills, no diarrhea, no fever and no headaches        [name] is a 50 y.o. female with chronic abdominal pain, back pain, reported IBS.  She presents to the Emergency Department this morning after being discharged from the hospital yesterday.  She was seen here on 11/11/2020 and returned on 11/12/20 was admitted at that time for intractable vomiting and hypokalemia.  She was discharged yesterday.  States that she remained nauseous and having abdominal pain at time of discharge.  States her vomiting returned last evening.  Unable to keep down any liquids or fluids.  She was prescribed  ondansetron and Percocet for pain, but states that she cannot keep the medication down.  She had CT abdomen and pelvis on 11/11/2020 without acute findings.  She denies any new symptoms.  States she is here this morning because she cannot keep her pain medication down and she reports persistent vomiting.  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   Long term prescription opiate use 05/19/2018   Lumbar radiculopathy 02/04/2017   Lumbar degenerative disc disease 02/04/2017   Lumbar spondylosis 02/04/2017   Chronic low back pain with bilateral sciatica 02/04/2017   Chronic pain syndrome 02/04/2017   Lumbar sprain 02/04/2017   Chronic  migraine without aura without status migrainosus, not intractable 02/04/2017    Past Surgical History:  Procedure Laterality Date   ABDOMINAL HYSTERECTOMY     BACK SURGERY     CHOLECYSTECTOMY     Complicated by bile leak requiring ERCP with temporary stenting   COLONOSCOPY WITH ESOPHAGOGASTRODUODENOSCOPY (EGD)  07/2009   [facility]: Op notes cannot be seen through care everywhere however pathology showed terminal ileum and random colon biopsies normal.  Stomach biopsy with gastric antral and fundic mucosa with reactive foveolar hyperplasia.  No active gastritis.  Stains negative for H. pylori.   ESOPHAGOGASTRODUODENOSCOPY  06/2007   [facility]: Normal esophagus, gastric mucosal abnormality with erythema, biopsy showed gastric antral and fundic mucosa with reactive foveolar hyperplasia, no gastritis, no H. pylori.   HAND SURGERY       OB History     Gravida  2   Para  2   Term  2   Preterm      AB      Living  2      SAB      IAB      Ectopic      Multiple      Live Births              Family History  Problem Relation Age of Onset   Cancer Other    Seizures Other  Stroke Other    Diabetes Other     Social History   Tobacco Use   Smoking status: Every Day    Packs/day: 1.00    Years: 20.00    Pack years: 20.00    Types: Cigarettes   Smokeless tobacco: Never  Vaping Use   Vaping Use: Never used  Substance Use Topics   Alcohol use: No   Drug use: No    Home Medications Prior to Admission medications   Medication Sig Start Date End Date Taking? Authorizing Provider  clonazePAM (KLONOPIN) 0.5 MG tablet Take 0.5 mg by mouth daily as needed for anxiety.  09/12/19   [provider]  famotidine (PEPCID) 20 MG tablet Take 1 tablet (20 mg total) by mouth 2 (two) times daily. 11/13/20   Orson Eva, MD  hydrOXYzine (ATARAX/VISTARIL) 50 MG tablet Take by mouth 2 (two) times daily. 09/29/20   [provider]  metoprolol succinate (TOPROL-XL) 100  MG 24 hr tablet Take 100 mg by mouth daily. Take with or immediately following a meal.    [provider]  oxyCODONE-acetaminophen (PERCOCET/ROXICET) 5-325 MG tablet Take 1 tablet by mouth every 8 (eight) hours as needed for severe pain. 10/28/20 11/27/20  Gillis Santa, MD  promethazine (PHENERGAN) 12.5 MG tablet Take 1 tablet (12.5 mg total) by mouth every 6 (six) hours as needed for nausea or vomiting. 11/13/20   Orson Eva, MD  temazepam (RESTORIL) 30 MG capsule Take 30 mg by mouth at bedtime as needed for sleep.    [provider]    Allergies    Buprenorphine, Penicillins, Aripiprazole, Seroquel [quetiapine], Cefaclor, Doxycycline, Gabapentin, Ibuprofen, Levofloxacin, Lisinopril, Macrobid [nitrofurantoin monohyd macro], and Naproxen  Review of Systems   Review of Systems  Constitutional:  Negative for appetite change, chills and fever.  Respiratory:  Negative for shortness of breath.   Cardiovascular:  Negative for chest pain.  Gastrointestinal:  Positive for abdominal pain, nausea and vomiting. Negative for abdominal distention, blood in stool and diarrhea.  Genitourinary:  Negative for decreased urine volume, difficulty urinating, dysuria and flank pain.  Musculoskeletal:  Negative for back pain.  Skin:  Negative for color change and rash.  Neurological:  Negative for dizziness, weakness, numbness and headaches.  Hematological:  Negative for adenopathy.   Physical Exam Updated Vital Signs BP (!) 166/92   Pulse 90   Temp (!) 100.6 F (38.1 C)   Resp (!) 21   Ht 5\' 1"  (1.549 m)   Wt 52.2 kg   SpO2 93%   BMI 21.73 kg/m   Physical Exam Vitals and nursing note reviewed.  Constitutional:      General: She is not in acute distress.    Appearance: Normal appearance.  Cardiovascular:     Rate and Rhythm: Normal rate and regular rhythm.     Pulses: Normal pulses.  Pulmonary:     Effort: Pulmonary effort is normal.  Abdominal:     Palpations: Abdomen is soft.  There is no mass.     Tenderness: There is abdominal tenderness (diffuse ttp, abd soft.  no distention). There is no right CVA tenderness or left CVA tenderness.  Musculoskeletal:        General: Normal range of motion.  Skin:    General: Skin is warm.     Capillary Refill: Capillary refill takes less than 2 seconds.  Neurological:     General: No focal deficit present.     Mental Status: She is alert.  Sensory: No sensory deficit.     Motor: No weakness.    ED Results / Procedures / Treatments   Labs (all labs ordered are listed, but only abnormal results are displayed) Labs Reviewed  COMPREHENSIVE METABOLIC PANEL - Abnormal; Notable for the following components:      Result Value   Glucose, Bld 131 (*)    Total Protein 8.2 (*)    All other components within normal limits  CBC - Abnormal; Notable for the following components:   WBC 13.5 (*)    RBC 5.16 (*)    Hemoglobin 15.8 (*)    HCT 48.7 (*)    Platelets 425 (*)    All other components within normal limits  CBC - Abnormal; Notable for the following components:   WBC 13.4 (*)    Platelets 426 (*)    All other components within normal limits  COMPREHENSIVE METABOLIC PANEL - Abnormal; Notable for the following components:   Glucose, Bld 136 (*)    All other components within normal limits  LIPASE, BLOOD  URINALYSIS, ROUTINE W REFLEX MICROSCOPIC    EKG None  Radiology No results found.  Procedures Procedures   Medications Ordered in ED Medications  ondansetron (ZOFRAN-ODT) disintegrating tablet 4 mg (4 mg Oral Given 11/14/20 1039)  sodium chloride 0.9 % bolus 1,000 mL (0 mLs Intravenous Stopped 11/14/20 1514)  pantoprazole (PROTONIX) injection 40 mg (40 mg Intravenous Given 11/14/20 1311)  HYDROmorphone (DILAUDID) injection 1 mg (1 mg Intravenous Given 11/14/20 1308)  haloperidol lactate (HALDOL) injection 2 mg (2 mg Intravenous Given 11/14/20 1441)    ED Course  I have reviewed the triage vital signs and the  nursing notes.  Pertinent labs & imaging results that were available during my care of the patient were reviewed by me and considered in my medical decision making (see chart for details).    MDM Rules/Calculators/A&P                          Patient has had multiple recent ER visits for abdominal pain, reported nausea and vomiting.  Has pain contract and has oxycodone at home.  She was seen by me on 11/12/2020 and admitted for intractable vomiting and hypokalemia.  She was discharged yesterday.  She returns today for continued nausea vomiting and abdominal pain.  She denies any new or worsening symptoms.  No focal tenderness on abd exam. States she is unable to keep down her medications or fluids and she is requesting additional IV pain medication and antiemetic.  On exam she does not appear acutely dehydrated.  Hypertensive, but states she has not taken her antihypertensive medication.  No orthostatic hypotension.  Will recheck baseline labs, administer IV fluids and address her pain.  I was contacted by lab and notified that lab was concerned of possible CBC collection from wrong patient from yesterday due to reported hemoglobin from CBC yesterday of 10.1 and 15.8 today.  Patient has not received blood transfusion.  Therefore CBC today to be repeated.  Labs today show mild leukocytosis.  Chemistries without significant change. Lipase unremarkable.  Tolerated oral fluids.  No vomiting today during ER stay.  No worsening abdominal pain.  She had CT of her abdomen and pelvis 3 days ago without acute findings.  No reported fever at home, symptoms improved after IV Haldol and IV fluids.  Pt unable to void, had a unremarkable U/A yesterday. She has tolerated oral fluids here and given a dose  of her antihypertensive medication.  She reports feeling better.  Feel that she is appropriate for discharge home, have advised that she follow-up with PCP and with GI this week.  Return precautions were  discussed.  Final Clinical Impression(s) / ED Diagnoses Final diagnoses:  Generalized abdominal pain  Non-intractable vomiting with nausea, unspecified vomiting type    Rx / DC Orders ED Discharge Orders     None        Kem Parkinson, PA-C 11/14/20 1850    Dorie Rank, MD 11/15/20 [phone]

## 2020-11-14 NOTE — ED Notes (Signed)
Informed pt in need of urine sample

## 2020-11-14 NOTE — ED Triage Notes (Signed)
Pt c/o abdominal pain and vomiting x 2 days. Pt reports she was given Zofran but can't keep it down.

## 2020-11-15 ENCOUNTER — Encounter: Payer: BC Managed Care – PPO | Admitting: Student in an Organized Health Care Education/Training Program

## 2020-11-17 ENCOUNTER — Other Ambulatory Visit: Payer: Self-pay | Admitting: Emergency Medicine

## 2020-11-17 ENCOUNTER — Other Ambulatory Visit (HOSPITAL_COMMUNITY): Payer: Self-pay | Admitting: Emergency Medicine

## 2020-11-17 DIAGNOSIS — N281 Cyst of kidney, acquired: Secondary | ICD-10-CM

## 2020-11-21 ENCOUNTER — Ambulatory Visit (INDEPENDENT_AMBULATORY_CARE_PROVIDER_SITE_OTHER): Payer: BC Managed Care – PPO | Admitting: Gastroenterology

## 2020-11-24 ENCOUNTER — Telehealth: Payer: Self-pay

## 2020-11-24 NOTE — Telephone Encounter (Signed)
Pt was in the hospital at the time of the MM appt pt wants to know if she can make a VV appt for med refill of oxycodone she knows about the 30 day supply pt was admitted to stay overnight at the hospital

## 2020-11-25 ENCOUNTER — Telehealth: Payer: Self-pay | Admitting: Student in an Organized Health Care Education/Training Program

## 2020-11-25 NOTE — Telephone Encounter (Signed)
Scveduled for a VV on Monday 11-28-2020

## 2020-11-28 ENCOUNTER — Other Ambulatory Visit: Payer: Self-pay

## 2020-11-28 ENCOUNTER — Ambulatory Visit (HOSPITAL_BASED_OUTPATIENT_CLINIC_OR_DEPARTMENT_OTHER): Payer: BC Managed Care – PPO | Admitting: Student in an Organized Health Care Education/Training Program

## 2020-11-29 ENCOUNTER — Telehealth: Payer: Self-pay

## 2020-11-29 ENCOUNTER — Ambulatory Visit
Payer: BC Managed Care – PPO | Attending: Student in an Organized Health Care Education/Training Program | Admitting: Student in an Organized Health Care Education/Training Program

## 2020-11-29 ENCOUNTER — Encounter: Payer: Self-pay | Admitting: Student in an Organized Health Care Education/Training Program

## 2020-11-29 VITALS — BP 136/60 | HR 95 | Temp 96.9°F | Resp 18 | Ht 61.0 in | Wt 115.0 lb

## 2020-11-29 DIAGNOSIS — M47816 Spondylosis without myelopathy or radiculopathy, lumbar region: Secondary | ICD-10-CM | POA: Insufficient documentation

## 2020-11-29 DIAGNOSIS — M7918 Myalgia, other site: Secondary | ICD-10-CM | POA: Diagnosis present

## 2020-11-29 DIAGNOSIS — G5703 Lesion of sciatic nerve, bilateral lower limbs: Secondary | ICD-10-CM | POA: Diagnosis not present

## 2020-11-29 DIAGNOSIS — M47818 Spondylosis without myelopathy or radiculopathy, sacral and sacrococcygeal region: Secondary | ICD-10-CM | POA: Diagnosis not present

## 2020-11-29 DIAGNOSIS — M533 Sacrococcygeal disorders, not elsewhere classified: Secondary | ICD-10-CM | POA: Diagnosis not present

## 2020-11-29 DIAGNOSIS — G894 Chronic pain syndrome: Secondary | ICD-10-CM

## 2020-11-29 DIAGNOSIS — Z79891 Long term (current) use of opiate analgesic: Secondary | ICD-10-CM | POA: Insufficient documentation

## 2020-11-29 MED ORDER — OXYCODONE-ACETAMINOPHEN 5-325 MG PO TABS: 1.0000 | ORAL_TABLET | Freq: Two times a day (BID) | ORAL | 0 refills | Status: AC | PRN

## 2020-11-29 NOTE — Progress Notes (Signed)
PROVIDER NOTE: Information contained herein reflects review and annotations entered in association with encounter. Interpretation of such information and data should be left to medically-trained personnel. Information provided to patient can be located elsewhere in the medical record under "Patient Instructions". Document created using STT-dictation technology, any transcriptional errors that may result from process are unintentional.    Patient: [name]  Service Category: E/M  Provider: Gillis Santa, MD  DOB: 1970-08-18  DOS: 11/29/2020  Specialty: Interventional Pain Management  MRN: [id number]  Setting: Ambulatory outpatient  PCP: Vidal Schwalbe, MD  Type: Established Patient    Referring Provider: Vidal Schwalbe, MD  Location: Office  Delivery: Face-to-face     HPI  [name], a 50 y.o. year old female, is here today because of her Sacroiliac joint pain [M53.3]. Ms. Mumford primary complain today is Back Pain (low) Last encounter: My last encounter with her was on 10/31/2020 Pertinent problems: Ms. Bertoni has Lumbar radiculopathy; Lumbar degenerative disc disease; Lumbar spondylosis; Chronic low back pain with bilateral sciatica; Chronic pain syndrome; Lumbar sprain; Chronic migraine without aura without status migrainosus, not intractable; SI joint arthritis; and Long term prescription opiate use on their pertinent problem list. Pain Assessment: Severity of Chronic pain is reported as a 8 /10. Location: Back Lower/radiates down left leg to big toe, right leg to knee. Onset: More than a month ago. Quality: Shooting, Stabbing. Timing: Constant. Modifying factor(s): injections, medications. Vitals:  height is $RemoveB'5\' 1"'GMQvsZoD$  (1.549 m) and weight is 115 lb (52.2 kg). Her temperature is 96.9 F (36.1 C) (abnormal). Her blood pressure is 136/60 and her pulse is 95. Her respiration is 18 and oxygen saturation is 100%.   Reason for encounter: medication management.  Presents today for MM. Getting  pain relief from SI-J injection on 10/31/20 Has has 2 ED visits and 1 overnight stay due to abdominal pain related to ulcerative colitis Has upcoming MR abdomen and pelvis    Pharmacotherapy Assessment   07/30/2020  05/31/2020   1  Oxycodone-Acetaminophen 5-325  75.00  30  [facility]  [id number]  Nor ([id number])  0/0  18.75 MME  Private Pay  [place]     Analgesic: Percocet 5 mg BID-TID PRN, max 75 tablets/month  Monitoring: [place] PMP: PDMP reviewed during this encounter.       Pharmacotherapy: No side-effects or adverse reactions reported. Compliance: No problems identified. Effectiveness: Clinically acceptable.  Dewayne Shorter, RN  11/29/2020 11:44 AM  Sign when Signing Visit Nursing Pain Medication Assessment:  Safety precautions to be maintained throughout the outpatient stay will include: orient to surroundings, keep bed in low position, maintain call bell within reach at all times, provide assistance with transfer out of bed and ambulation.  Medication Inspection Compliance: Ms. Tukes did not comply with our request to bring her pills to be counted. She was reminded that bringing the medication bottles, even when empty, is a requirement.  Medication: None brought in. Pill/Patch Count: None available to be counted. Bottle Appearance: No container available. Did not bring bottle(s) to appointment. Filled Date: N/A Last Medication intake:  Wilburn Mylar States she got them filled 10-28-2020      UDS:  Summary  Date Value Ref Range Status  08/24/2020 Note  Final    Comment:    ==================================================================== [employer] [facility] 13 (MW) ==================================================================== Test                             Result  Flag       Units  Drug Present and Declared for Prescription Verification   Oxazepam                       53           EXPECTED   ng/mg creat   Temazepam                      114          EXPECTED   ng/mg creat     Oxazepam and temazepam are expected metabolites of diazepam.    Oxazepam is also an expected metabolite of other benzodiazepine    drugs, including chlordiazepoxide, prazepam, clorazepate, halazepam,    and temazepam.  Oxazepam and temazepam are available as scheduled    prescription medications.    7-aminoclonazepam              318          EXPECTED   ng/mg creat    7-aminoclonazepam is an expected metabolite of clonazepam. Source of    clonazepam is a scheduled prescription medication.    Oxycodone                      7108         EXPECTED   ng/mg creat   Oxymorphone                    1567         EXPECTED   ng/mg creat   Noroxycodone                   3353         EXPECTED   ng/mg creat   Noroxymorphone                 227          EXPECTED   ng/mg creat    Sources of oxycodone are scheduled prescription medications.    Oxymorphone, noroxycodone, and noroxymorphone are expected    metabolites of oxycodone. Oxymorphone is also available as a    scheduled prescription medication.  ==================================================================== Test                      Result    Flag   Units      Ref Range   Creatinine              49               mg/dL      >=20 ==================================================================== Declared Medications:  The flagging and interpretation on this report are based on the  following declared medications.  Unexpected results may arise from  inaccuracies in the declared medications.   **Note: The testing scope of this panel includes these medications:   Clonazepam (Klonopin)  Oxycodone (Percocet)  Temazepam (Restoril)   **Note: The testing scope of this panel does not include the  following reported medications:   Acetaminophen (Percocet)  Fluticasone (Flonase)  Metoprolol (Toprol)  Ondansetron (Zofran)  Prednisone (Deltasone) ==================================================================== For clinical consultation,  please call [phone]. ====================================================================      ROS  Constitutional: Denies any fever or chills Gastrointestinal: No reported hemesis, hematochezia, vomiting, or acute GI distress Musculoskeletal:  Bilateral SI joint, buttock pain Neurological: No reported episodes of acute onset apraxia, aphasia, dysarthria, agnosia, amnesia, paralysis, loss of coordination,  or loss of consciousness  Medication Review  clonazePAM, famotidine, hydrOXYzine, metoprolol succinate, oxyCODONE-acetaminophen, promethazine, and temazepam  History Review  Allergy: Ms. Sharpless is allergic to buprenorphine, penicillins, aripiprazole, seroquel [quetiapine], cefaclor, doxycycline, gabapentin, ibuprofen, levofloxacin, lisinopril, macrobid [nitrofurantoin monohyd macro], and naproxen. Drug: Ms. Galster  reports no history of drug use. Alcohol:  reports no history of alcohol use. Tobacco:  reports that she has been smoking cigarettes. She has a 20.00 pack-year smoking history. She has never used smokeless tobacco. Social: Ms. Benassi  reports that she has been smoking cigarettes. She has a 20.00 pack-year smoking history. She has never used smokeless tobacco. She reports that she does not drink alcohol and does not use drugs. Medical:  has a past medical history of Chronic back pain, Colitis, Hypertension, Migraine, and Plumbism. Surgical: Ms. Lawler  has a past surgical history that includes Abdominal hysterectomy; Cholecystectomy; Hand surgery; Back surgery; Colonoscopy with esophagogastroduodenoscopy (egd) (07/2009); and Esophagogastroduodenoscopy (06/2007). Family: family history includes Cancer in an other family member; Diabetes in an other family member; Seizures in an other family member; Stroke in an other family member.  Laboratory Chemistry Profile   Renal Lab Results  Component Value Date   BUN 11 11/14/2020   CREATININE 0.75 11/14/2020   GFRAA >60 01/17/2020    GFRNONAA >60 11/14/2020     Hepatic Lab Results  Component Value Date   AST 29 11/14/2020   ALT 25 11/14/2020   ALBUMIN 3.9 11/14/2020   ALKPHOS 108 11/14/2020   LIPASE 25 11/14/2020     Electrolytes Lab Results  Component Value Date   NA 139 11/14/2020   K 3.7 11/14/2020   CL 104 11/14/2020   CALCIUM 9.3 11/14/2020     Bone No results found for: VD25OH, PQ330QT6AUQ, JF3545GY5, WL8937DS2, 25OHVITD1, 25OHVITD2, 25OHVITD3, TESTOFREE, TESTOSTERONE   Inflammation (CRP: Acute Phase) (ESR: Chronic Phase) Lab Results  Component Value Date   LATICACIDVEN 1.8 11/12/2020       Note: Above Lab results reviewed.  Recent Imaging Review  CT Abdomen Pelvis W Contrast CLINICAL DATA:  50 year old female with acute abdominal and pelvic pain.  EXAM: CT ABDOMEN AND PELVIS WITH CONTRAST  TECHNIQUE: Multidetector CT imaging of the abdomen and pelvis was performed using the standard protocol following bolus administration of intravenous contrast.  CONTRAST:  127mL OMNIPAQUE IOHEXOL 300 MG/ML  SOLN  COMPARISON:  08/18/2020 CT and prior studies  FINDINGS: Lower chest: No acute abnormality.  Hepatobiliary: The liver is unremarkable. The patient is status post cholecystectomy. No biliary dilatation.  Pancreas: Unremarkable  Spleen: Unremarkable  Adrenals/Urinary Tract: The kidneys, adrenal glands and bladder are unremarkable except for stable LEFT renal cysts.  Stomach/Bowel: Stomach is within normal limits. Appendix appears unchanged with maximal diameter 6 mm. No evidence of bowel wall thickening, distention, or inflammatory changes.  Vascular/Lymphatic: Aortic atherosclerosis. No enlarged abdominal or pelvic lymph nodes.  Reproductive: Status post hysterectomy. No adnexal masses.  Other: No ascites, pneumoperitoneum or focal collection.  Musculoskeletal: No acute or suspicious bony abnormalities identified.  IMPRESSION: 1. No evidence of acute abnormality. 2.  Aortic Atherosclerosis (ICD10-I70.0).  Electronically Signed   By: Margarette Canada M.D.   On: 11/11/2020 15:05 Note: Reviewed        Physical Exam  General appearance: Well nourished, well developed, and well hydrated. In no apparent acute distress Mental status: Alert, oriented x 3 (person, place, & time)       Respiratory: No evidence of acute respiratory distress Eyes: PERLA Vitals: BP 136/60 (BP Location:  Right Arm, Patient Position: Sitting, Cuff Size: Normal)   Pulse 95   Temp (!) 96.9 F (36.1 C)   Resp 18   Ht $R'5\' 1"'cO$  (1.549 m)   Wt 115 lb (52.2 kg)   SpO2 100%   BMI 21.73 kg/m  BMI: Estimated body mass index is 21.73 kg/m as calculated from the following:   Height as of this encounter: $RemoveBeforeD'5\' 1"'LLWshEUVOalEWc$  (1.549 m).   Weight as of this encounter: 115 lb (52.2 kg). Ideal: Ideal body weight: 47.8 kg (105 lb 6.1 oz) Adjusted ideal body weight: 49.5 kg (109 lb 3.7 oz)  Lumbar Spine Area Exam  Skin & Axial Inspection: No masses, redness, or swelling Alignment: Symmetrical Functional ROM: Pain restricted ROM       Stability: No instability detected Muscle Tone/Strength: Functionally intact. No obvious neuro-muscular anomalies detected. Sensory (Neurological): Musculoskeletal pain pattern   Provocative Tests:  Lateral bending test: deferred today       Patrick's Maneuver: (+) for bilateral S-I arthralgia             FABER* test: (+) for bilateral S-I arthralgia             S-I anterior distraction/compression test: (+)   S-I arthralgia/arthropathy   Gait & Posture Assessment  Ambulation: Unassisted Gait: Antalgic Posture: Difficulty standing up straight, due to pain   Lower Extremity Exam      Side: Right lower extremity   Side: Left lower extremity  Stability: No instability observed           Stability: No instability observed          Skin & Extremity Inspection: Skin color, temperature, and hair growth are WNL. No peripheral edema or cyanosis. No masses, redness, swelling,  asymmetry, or associated skin lesions. No contractures.   Skin & Extremity Inspection: Skin color, temperature, and hair growth are WNL. No peripheral edema or cyanosis. No masses, redness, swelling, asymmetry, or associated skin lesions. No contractures.  Functional ROM: Pain restricted ROM for hip joint           Functional ROM: Pain restricted ROM                  Muscle Tone/Strength: Functionally intact. No obvious neuro-muscular anomalies detected.   Muscle Tone/Strength: Functionally intact. No obvious neuro-muscular anomalies detected.  Sensory (Neurological): Referred pain pattern from SI joint         Sensory (Neurological): Referred pain pattern from SI joint        DTR: Patellar: deferred today Achilles: deferred today Plantar: deferred today   DTR: Patellar: deferred today Achilles: deferred today Plantar: deferred today  Palpation: No palpable anomalies   Palpation: No palpable anomalies     Assessment   Status Diagnosis  Persistent Persistent Persistent 1. Sacroiliac joint pain   2. SI joint arthritis   3. Piriformis syndrome of both sides   4. Long term prescription opiate use   5. Lumbar spondylosis   6. Myofascial pain   7. Chronic pain syndrome        Plan of Care   Ms. DIORA BELLIZZI has a current medication list which includes the following long-term medication(s): clonazepam, famotidine, and promethazine.  Pharmacotherapy (Medications Ordered): Meds ordered this encounter  Medications   oxyCODONE-acetaminophen (PERCOCET/ROXICET) 5-325 MG tablet    Sig: Take 1-2 tablets by mouth every 12 (twelve) hours as needed for severe pain.    Dispense:  75 tablet    Refill:  0  oxyCODONE-acetaminophen (PERCOCET/ROXICET) 5-325 MG tablet    Sig: Take 1-2 tablets by mouth every 12 (twelve) hours as needed for severe pain.    Dispense:  75 tablet    Refill:  0   oxyCODONE-acetaminophen (PERCOCET/ROXICET) 5-325 MG tablet    Sig: Take 1-2 tablets by mouth every 12  (twelve) hours as needed for severe pain.    Dispense:  75 tablet    Refill:  0   PRN SI-J injection for SI joint arthropathy  Follow-up plan:   Return in about 3 months (around 03/01/2021) for Medication Management, in person.   Recent Visits Date Type Provider Dept  10/31/20 Procedure visit Gillis Santa, MD [facility]  10/18/20 Telemedicine Gillis Santa, MD [facility]  Showing recent visits within past 90 days and meeting all other requirements Today's Visits Date Type Provider Dept  11/29/20 Telemedicine Gillis Santa, MD [facility]  Showing today's visits and meeting all other requirements Future Appointments No visits were found meeting these conditions. Showing future appointments within next 90 days and meeting all other requirements I discussed the assessment and treatment plan with the patient. The patient was provided an opportunity to ask questions and all were answered. The patient agreed with the plan and demonstrated an understanding of the instructions.  Patient advised to call back or seek an in-person evaluation if the symptoms or condition worsens.  Duration of encounter: 30 minutes.  Note by: Gillis Santa, MD Date: 11/29/2020; Time: 1:05 PM

## 2020-11-29 NOTE — Progress Notes (Signed)
Nursing Pain Medication Assessment:  Safety precautions to be maintained throughout the outpatient stay will include: orient to surroundings, keep bed in low position, maintain call bell within reach at all times, provide assistance with transfer out of bed and ambulation.  Medication Inspection Compliance: [name] did not comply with our request to bring her pills to be counted. She was reminded that bringing the medication bottles, even when empty, is a requirement.  Medication: None brought in. Pill/Patch Count: None available to be counted. Bottle Appearance: No container available. Did not bring bottle(s) to appointment. Filled Date: N/A Last Medication intake:  [name] States she got them filled 10-28-2020

## 2020-11-30 NOTE — Progress Notes (Signed)
f2f

## 2020-12-05 ENCOUNTER — Encounter: Payer: Self-pay | Admitting: Internal Medicine

## 2020-12-09 ENCOUNTER — Ambulatory Visit (HOSPITAL_COMMUNITY): Payer: BC Managed Care – PPO

## 2020-12-09 ENCOUNTER — Ambulatory Visit (HOSPITAL_COMMUNITY): Admission: RE | Admit: 2020-12-09 | Payer: BC Managed Care – PPO | Source: Ambulatory Visit

## 2020-12-09 ENCOUNTER — Encounter (HOSPITAL_COMMUNITY): Payer: Self-pay

## 2020-12-23 ENCOUNTER — Other Ambulatory Visit: Payer: Self-pay

## 2020-12-23 ENCOUNTER — Ambulatory Visit (HOSPITAL_COMMUNITY)
Admission: RE | Admit: 2020-12-23 | Discharge: 2020-12-23 | Disposition: A | Discharge status: Home / Self Care | Payer: BC Managed Care – PPO | Source: Ambulatory Visit | Attending: Emergency Medicine | Admitting: Emergency Medicine

## 2020-12-23 DIAGNOSIS — N281 Cyst of kidney, acquired: Secondary | ICD-10-CM | POA: Insufficient documentation

## 2020-12-23 IMAGING — MR MR PELVIS WO/W CM
32 series · 48 of 48 positions shown · IV contrast (5 ml Gadavist)
Comparison: MRI [DATE] and multiple prior CT scans.

CLINICAL DATA: Follow-up multi septated cystic lesion involving the
upper pole region of the left kidney. Mid and lower abdominal pain.

EXAM:
MRI ABDOMEN AND PELVIS WITHOUT AND WITH CONTRAST
TECHNIQUE: Multiplanar multisequence MR imaging of the abdomen and pelvis was
performed both before and after the administration of intravenous
contrast.
CONTRAST:  5mL GADAVIST GADOBUTROL 1 MMOL/ML IV SOLN

[Series 4: cor haste · coronal · 6.0mm · 1.31mm/px · 1 of 30 slices shown]
[im 1/30]
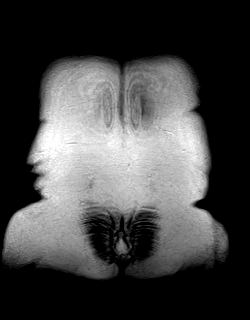

[Series 6: DWI · axial · 6.0mm · 1.42mm/px · 1 of 35 slices shown (1 of 12)]
[im 1/35]
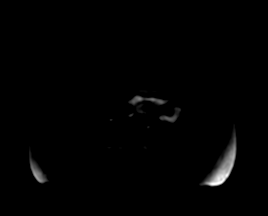

[Series 6: DWI · axial · 6.0mm · 1.42mm/px · 1 of 35 slices shown (2 of 12)]
[im 1/35]
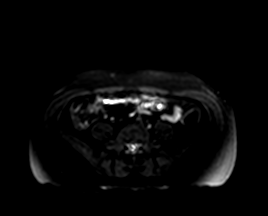

[Series 6: DWI · axial · 6.0mm · 1.42mm/px · 1 of 35 slices shown (3 of 12)]
[im 1/35]
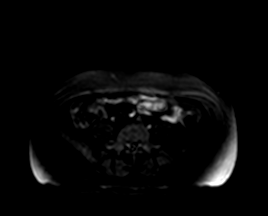

[Series 7: DWI · axial · 6.0mm · 1.42mm/px · 1 of 35 slices shown (4 of 12)]
[im 1/35]
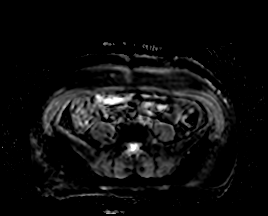

[Series 8: DWI · axial · 6.0mm · 1.42mm/px · 1 of 30 slices shown (5 of 12)]
[im 1/30]
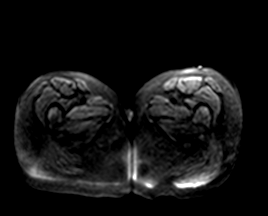

[Series 8: DWI · axial · 6.0mm · 1.42mm/px · 1 of 30 slices shown (6 of 12)]
[im 1/30]
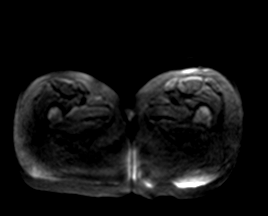

[Series 8: DWI · axial · 6.0mm · 1.42mm/px · 1 of 30 slices shown (7 of 12)]
[im 1/30]
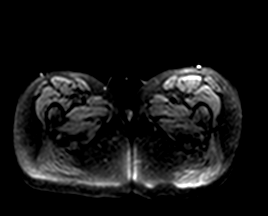

[Series 9: DWI · axial · 6.0mm · 1.42mm/px · 1 of 30 slices shown (8 of 12)]
[im 1/30]
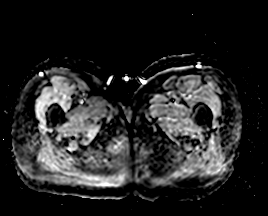

[Series 10: DWI · axial · 7.2mm · 1.42mm/px · 1 of 63 slices shown (9 of 12)]
[im 1/63]
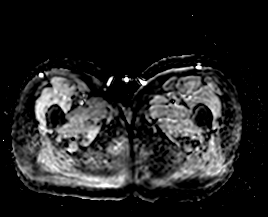

[Series 11: DWI · axial · 7.2mm · 1.42mm/px · 1 of 63 slices shown (10 of 12)]
[im 1/63]
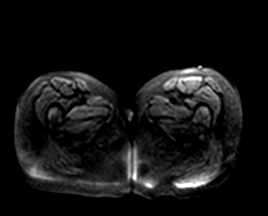

[Series 11: DWI · axial · 7.2mm · 1.42mm/px · 1 of 63 slices shown (11 of 12)]
[im 1/63]
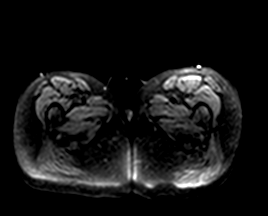

[Series 11: DWI · axial · 7.2mm · 1.42mm/px · 1 of 63 slices shown (12 of 12)]
[im 1/63]
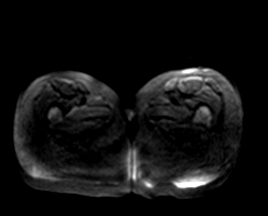

[Series 12: T1 dynamic · axial · 3.0mm · 1.64mm/px · z∈[-265,+164]mm · 3 of 144 slices shown (1 of 8)]
[im 1/144]
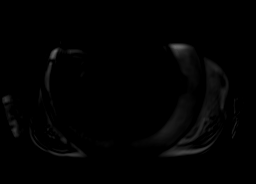
[im 72/144]
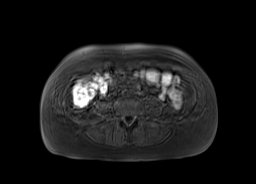
[im 144/144]
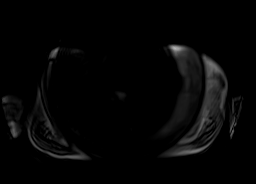

[Series 13: ax haste · axial · 6.0mm · 1.25mm/px · 1 of 58 slices shown]
[im 1/58]
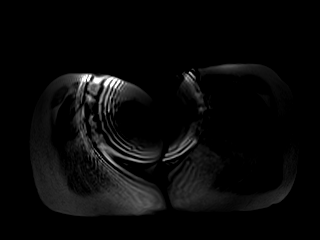

[Series 14: bSSFP · coronal · 6.0mm · 0.90mm/px · 1 of 32 slices shown]
[im 1/32]
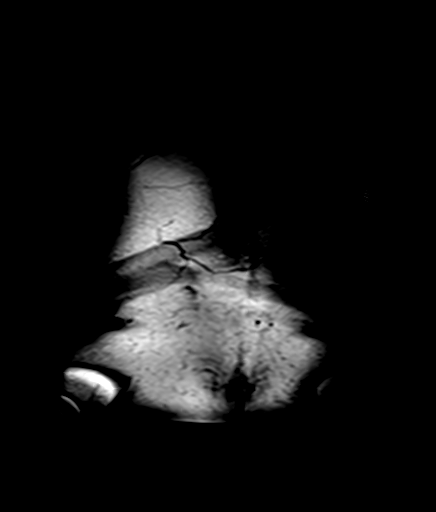

[Series 15: ax in and · axial · 3.5mm · 1.31mm/px · 1 of 60 slices shown (1 of 2)]
[im 1/60]
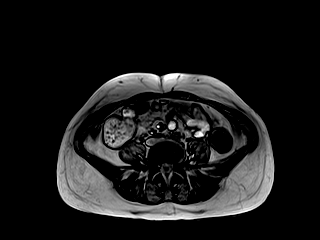

[Series 16: ax in and · axial · 3.5mm · 1.31mm/px · 1 of 60 slices shown (2 of 2)]
[im 1/60]
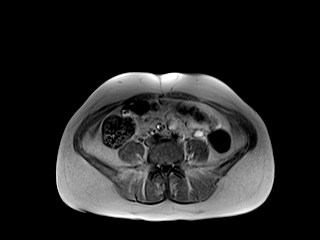

[Series 17: T1 dynamic · axial · non-contrast · 3.0mm · 1.19mm/px · z∈[-72,+141]mm · 2 of 72 slices shown (2 of 8)]
[im 1/72]
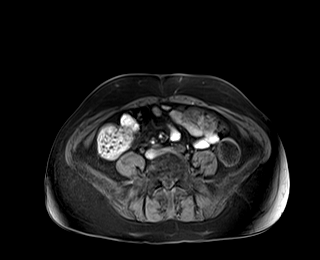
[im 72/72]
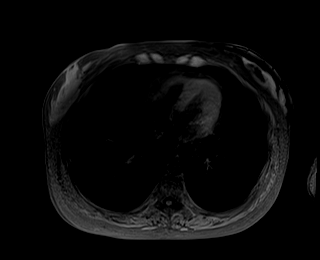

[Series 18: T1 dynamic · axial · non-contrast · 3.0mm · 1.19mm/px · z∈[-240,-27]mm · 2 of 72 slices shown (3 of 8)]
[im 1/72]
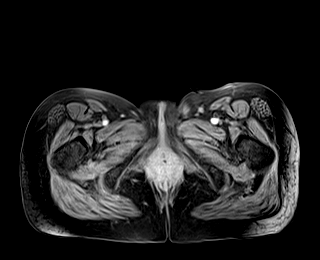
[im 72/72]
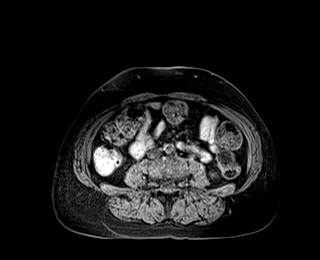

[Series 19: T2 fat-sat · axial · 6.0mm · 1.19mm/px · z∈[-266,+159]mm · 2 of 60 slices shown]
[im 1/60]
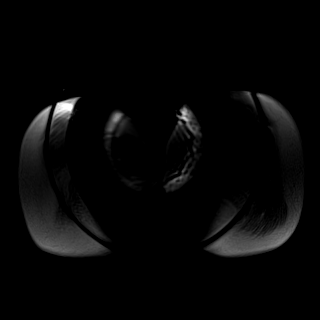
[im 60/60]
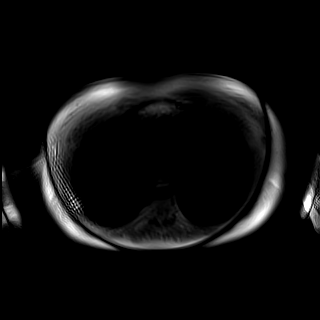

[Series 21: T1 dynamic post-contrast · axial · 3.0mm · 1.19mm/px · z∈[-72,+141]mm · 2 of 72 slices shown (1 of 6)]
[im 1/72]
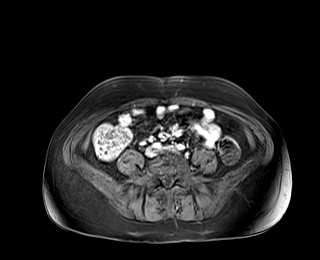
[im 72/72]
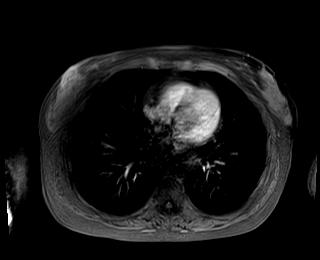

[Series 22: T1 dynamic · axial · 3.0mm · 1.19mm/px · z∈[-72,+141]mm · 2 of 72 slices shown (4 of 8)]
[im 1/72]
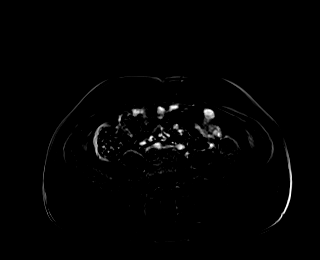
[im 72/72]
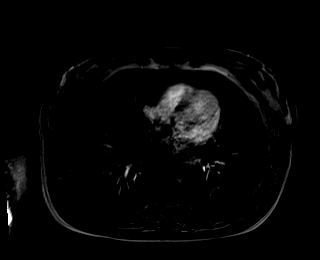

[Series 23: T1 dynamic post-contrast · coronal · 3.0mm · 1.31mm/px · 2 of 72 slices shown (2 of 6)]
[im 1/72]
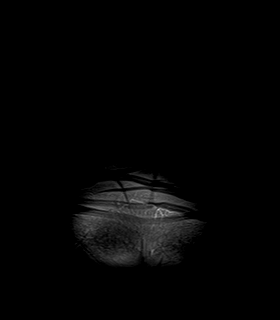
[im 72/72]
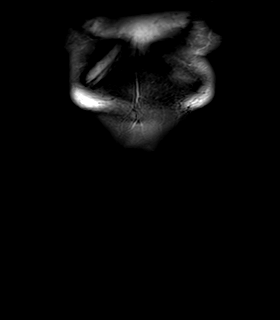

[Series 24: T1 dynamic post-contrast · axial · 3.0mm · 1.19mm/px · z∈[-72,+141]mm · 2 of 72 slices shown (3 of 6)]
[im 1/72]
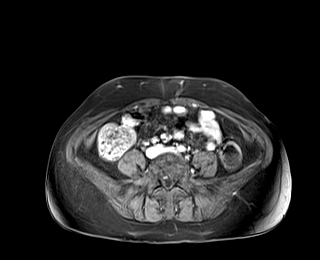
[im 72/72]
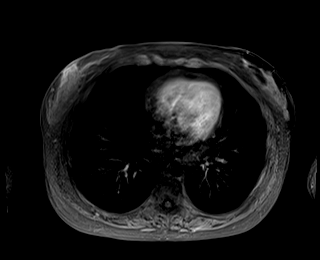

[Series 25: T1 dynamic · axial · 3.0mm · 1.19mm/px · z∈[-72,+141]mm · 2 of 72 slices shown (5 of 8)]
[im 1/72]
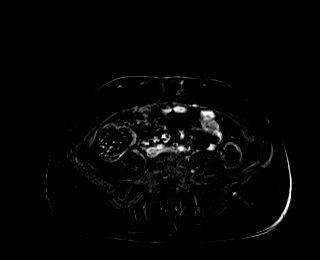
[im 72/72]
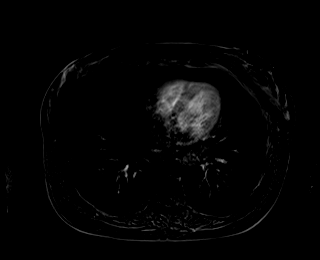

[Series 26: T1 dynamic post-contrast · axial · 3.0mm · 1.19mm/px · z∈[-72,+141]mm · 2 of 72 slices shown (4 of 6)]
[im 1/72]
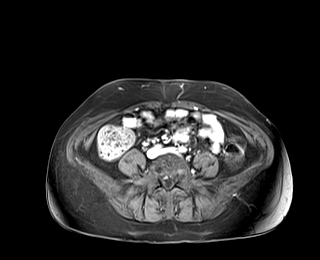
[im 72/72]
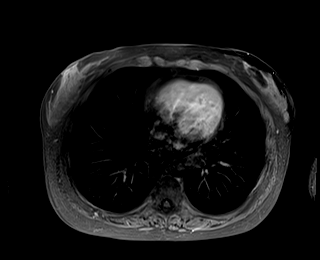

[Series 27: T1 dynamic · axial · 3.0mm · 1.19mm/px · z∈[-72,+141]mm · 2 of 72 slices shown (6 of 8)]
[im 1/72]
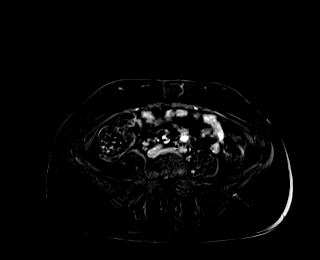
[im 72/72]
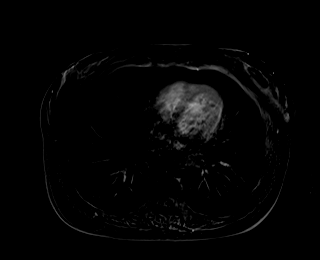

[Series 30: T1 dynamic post-contrast · axial · 3.0mm · 1.19mm/px · z∈[-240,-27]mm · 2 of 72 slices shown (5 of 6)]
[im 1/72]
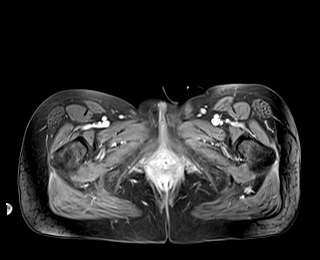
[im 72/72]
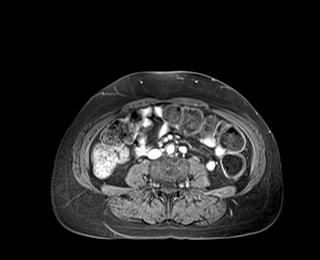

[Series 31: T1 dynamic · axial · 3.0mm · 1.19mm/px · z∈[-240,-27]mm · 2 of 72 slices shown (7 of 8)]
[im 1/72]
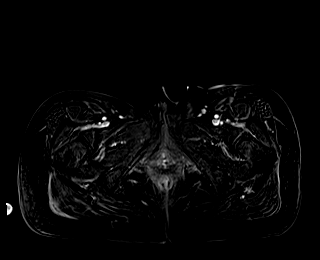
[im 72/72]
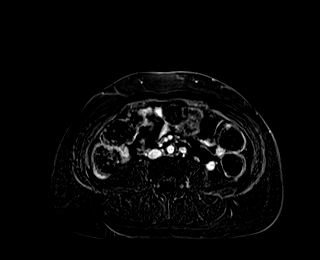

[Series 32: T1 dynamic post-contrast · axial · 3.0mm · 1.19mm/px · z∈[-72,+141]mm · 2 of 72 slices shown (6 of 6)]
[im 1/72]
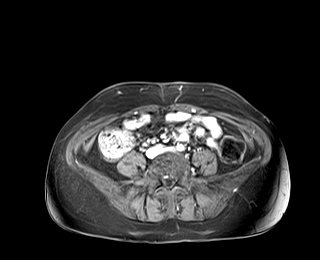
[im 72/72]
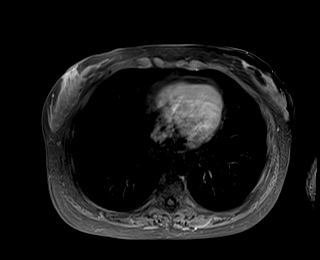

[Series 33: T1 dynamic · axial · 3.0mm · 1.19mm/px · z∈[-72,+141]mm · 2 of 72 slices shown (8 of 8)]
[im 1/72]
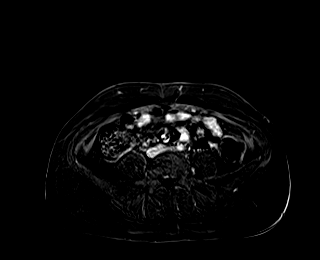
[im 72/72]
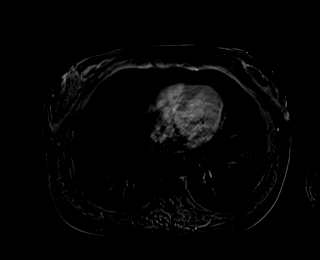

[48 of 48 positions shown; findings below may reference images not displayed]

FINDINGS: COMBINED FINDINGS FOR BOTH MR ABDOMEN AND PELVIS

Lower chest: The lung bases are clear of an acute process. No
pulmonary lesions or pleural effusions. No pericardial effusion.

Hepatobiliary: No hepatic lesions or intrahepatic biliary
dilatation. The gallbladder is surgically absent. No common bile
duct dilatation.

Pancreas:  No mass, inflammation or ductal dilatation.

Spleen:  Normal size. No focal lesions.

Adrenals/Urinary Tract:  The adrenal glands are normal.

Stable 3.8 x 3.0 x 2.5 cm septated cystic lesion projecting off the
upper pole region of the left kidney. The uppermost portion of this
lesion demonstrates increased T1 signal intensity suggesting
hemorrhage or proteinaceous debris. No contrast enhancement is
identified. Very thin septations and very thin wall showing
enhancement postcontrast. No nodularity or nodular enhancement. The
lesion is diffusion negative.

Small bilateral simple appearing renal cysts. No new renal lesions.

Stomach/Bowel: The stomach, duodenum, small bowel and colon are
unremarkable. No colonic lesions or findings for obstruction.
Moderate sigmoid colon diverticulosis.

Vascular/Lymphatic: The aorta and branch vessels are unremarkable.
The major venous structures are patent. No mesenteric or
retroperitoneal mass or adenopathy.

Reproductive: The uterus is surgically absent. Both ovaries are
still present and appear normal.

Other: No pelvic mass or pelvic adenopathy. No free pelvic fluid
collections. No inguinal mass.

Musculoskeletal: No significant bony findings.
IMPRESSION: 1. Stable 3.8 x 3.0 x 2.5 cm septated cystic lesion projecting off
the upper pole region of the left kidney. This is a Bosniak 2F
lesion. Recommend follow-up MR examination in 1 year.
2. Small bilateral simple appearing renal cysts.
3. Status post hysterectomy. Both ovaries are still present and
appear normal.
4. No abdominal or retroperitoneal mass or adenopathy.

## 2020-12-23 IMAGING — MR MR ABDOMEN WO/W CM
32 series · 48 of 48 positions shown · IV contrast (5 ml Gadavist)
Comparison: MRI [DATE] and multiple prior CT scans.

CLINICAL DATA: Follow-up multi septated cystic lesion involving the
upper pole region of the left kidney. Mid and lower abdominal pain.

EXAM:
MRI ABDOMEN AND PELVIS WITHOUT AND WITH CONTRAST
TECHNIQUE: Multiplanar multisequence MR imaging of the abdomen and pelvis was
performed both before and after the administration of intravenous
contrast.
CONTRAST:  5mL GADAVIST GADOBUTROL 1 MMOL/ML IV SOLN

[Series 4: cor haste · coronal · 6.0mm · 1.31mm/px · 1 of 30 slices shown]
[im 1/30]
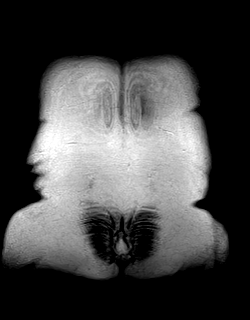

[Series 6: DWI · axial · 6.0mm · 1.42mm/px · 1 of 35 slices shown (1 of 12)]
[im 1/35]
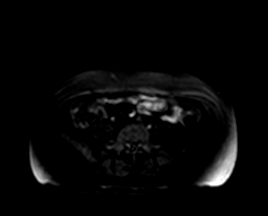

[Series 6: DWI · axial · 6.0mm · 1.42mm/px · 1 of 35 slices shown (2 of 12)]
[im 1/35]
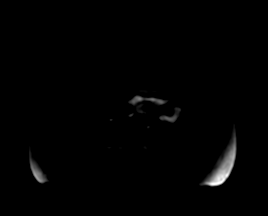

[Series 6: DWI · axial · 6.0mm · 1.42mm/px · 1 of 35 slices shown (3 of 12)]
[im 1/35]
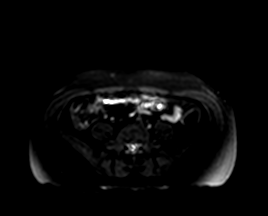

[Series 7: DWI · axial · 6.0mm · 1.42mm/px · 1 of 35 slices shown (4 of 12)]
[im 1/35]
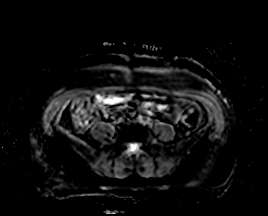

[Series 8: DWI · axial · 6.0mm · 1.42mm/px · 1 of 30 slices shown (5 of 12)]
[im 1/30]
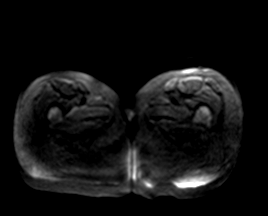

[Series 8: DWI · axial · 6.0mm · 1.42mm/px · 1 of 30 slices shown (6 of 12)]
[im 1/30]
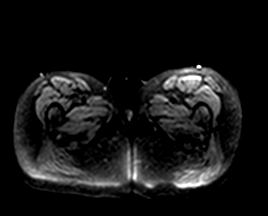

[Series 8: DWI · axial · 6.0mm · 1.42mm/px · 1 of 30 slices shown (7 of 12)]
[im 1/30]
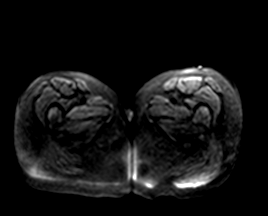

[Series 9: DWI · axial · 6.0mm · 1.42mm/px · 1 of 30 slices shown (8 of 12)]
[im 1/30]
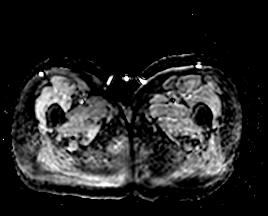

[Series 10: DWI · axial · 7.2mm · 1.42mm/px · 1 of 63 slices shown (9 of 12)]
[im 1/63]
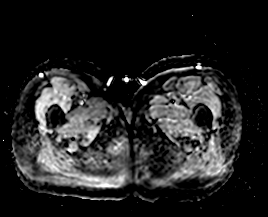

[Series 11: DWI · axial · 7.2mm · 1.42mm/px · 1 of 63 slices shown (10 of 12)]
[im 1/63]
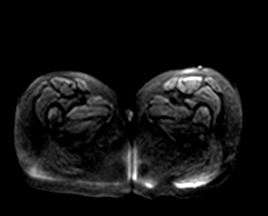

[Series 11: DWI · axial · 7.2mm · 1.42mm/px · 1 of 63 slices shown (11 of 12)]
[im 1/63]
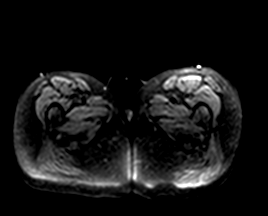

[Series 11: DWI · axial · 7.2mm · 1.42mm/px · 1 of 63 slices shown (12 of 12)]
[im 1/63]
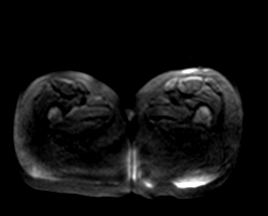

[Series 12: T1 dynamic · axial · 3.0mm · 1.64mm/px · z∈[-265,+164]mm · 3 of 144 slices shown (1 of 8)]
[im 1/144]
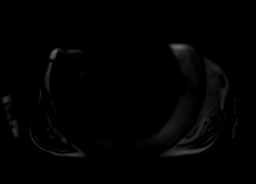
[im 72/144]
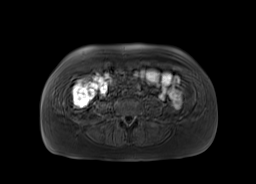
[im 144/144]
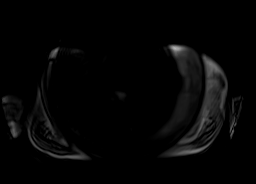

[Series 13: ax haste · axial · 6.0mm · 1.25mm/px · 1 of 58 slices shown]
[im 1/58]
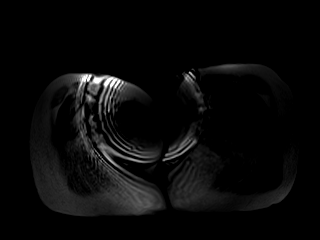

[Series 14: bSSFP · coronal · 6.0mm · 0.90mm/px · 1 of 32 slices shown]
[im 1/32]
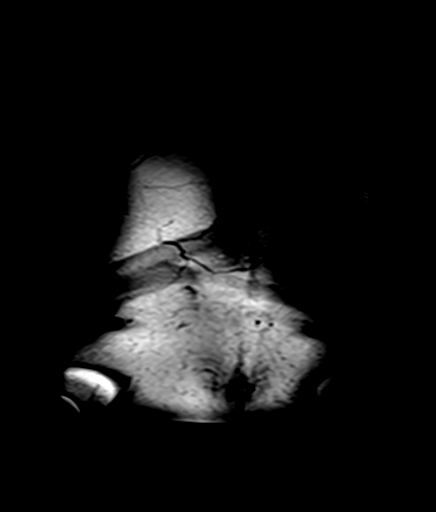

[Series 15: ax in and · axial · 3.5mm · 1.31mm/px · 1 of 60 slices shown (1 of 2)]
[im 1/60]
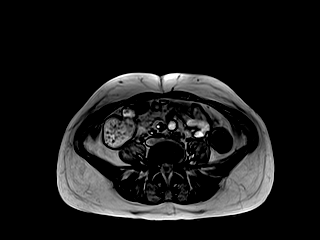

[Series 16: ax in and · axial · 3.5mm · 1.31mm/px · 1 of 60 slices shown (2 of 2)]
[im 1/60]
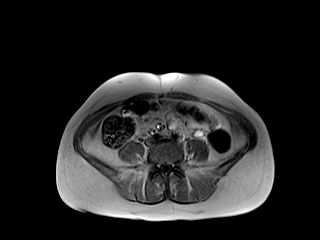

[Series 17: T1 dynamic · axial · non-contrast · 3.0mm · 1.19mm/px · z∈[-72,+141]mm · 2 of 72 slices shown (2 of 8)]
[im 1/72]
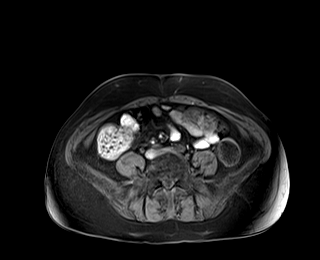
[im 72/72]
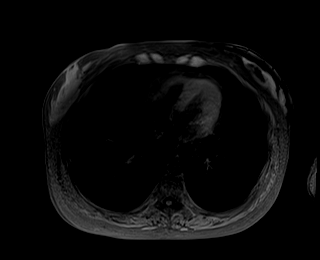

[Series 18: T1 dynamic · axial · non-contrast · 3.0mm · 1.19mm/px · z∈[-240,-27]mm · 2 of 72 slices shown (3 of 8)]
[im 1/72]
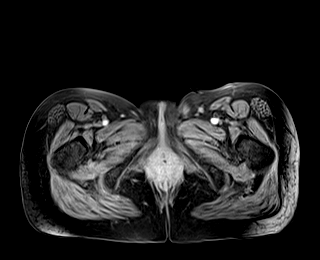
[im 72/72]
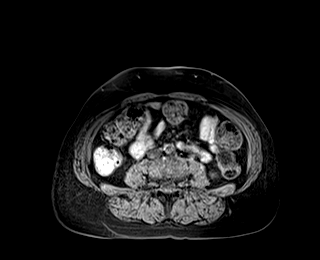

[Series 19: T2 fat-sat · axial · 6.0mm · 1.19mm/px · z∈[-266,+159]mm · 2 of 60 slices shown]
[im 1/60]
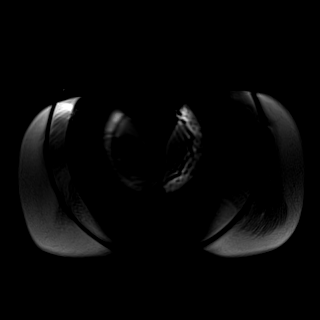
[im 60/60]
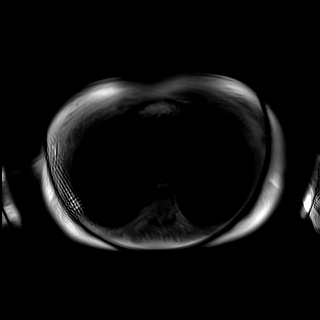

[Series 21: T1 dynamic post-contrast · axial · 3.0mm · 1.19mm/px · z∈[-72,+141]mm · 2 of 72 slices shown (1 of 6)]
[im 1/72]
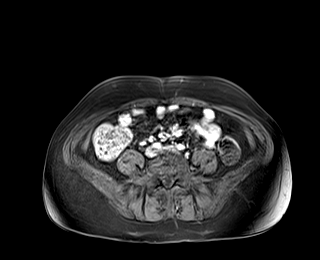
[im 72/72]
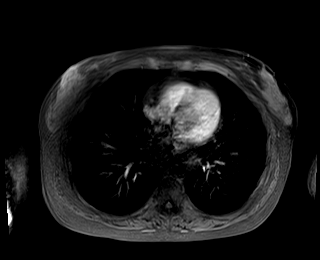

[Series 22: T1 dynamic · axial · 3.0mm · 1.19mm/px · z∈[-72,+141]mm · 2 of 72 slices shown (4 of 8)]
[im 1/72]
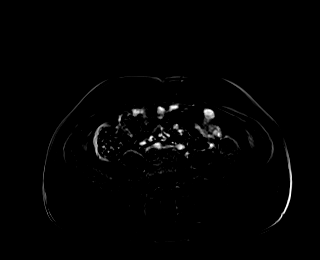
[im 72/72]
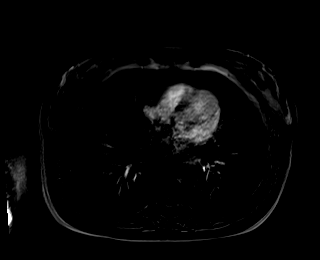

[Series 23: T1 dynamic post-contrast · coronal · 3.0mm · 1.31mm/px · 2 of 72 slices shown (2 of 6)]
[im 1/72]
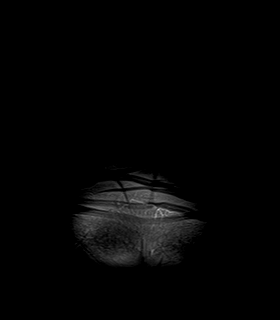
[im 72/72]
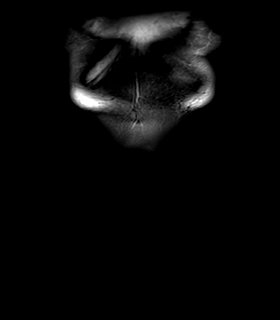

[Series 24: T1 dynamic post-contrast · axial · 3.0mm · 1.19mm/px · z∈[-72,+141]mm · 2 of 72 slices shown (3 of 6)]
[im 1/72]
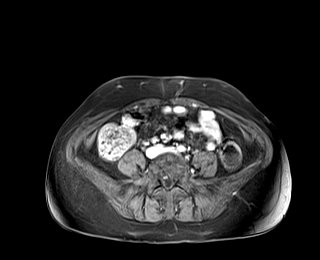
[im 72/72]
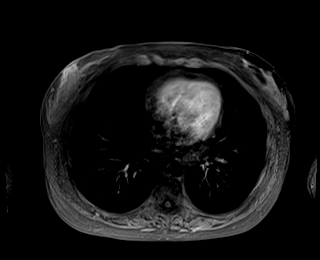

[Series 25: T1 dynamic · axial · 3.0mm · 1.19mm/px · z∈[-72,+141]mm · 2 of 72 slices shown (5 of 8)]
[im 1/72]
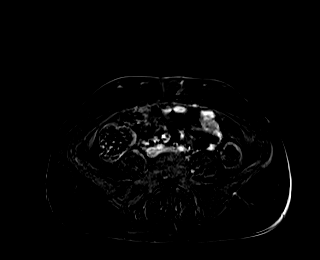
[im 72/72]
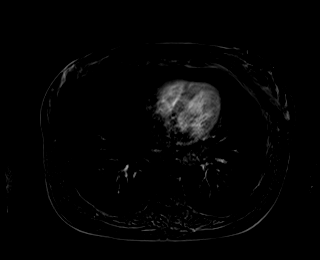

[Series 26: T1 dynamic post-contrast · axial · 3.0mm · 1.19mm/px · z∈[-72,+141]mm · 2 of 72 slices shown (4 of 6)]
[im 1/72]
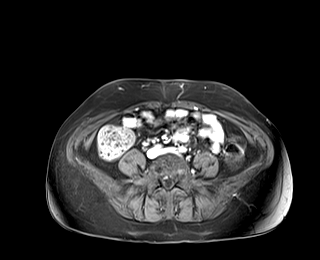
[im 72/72]
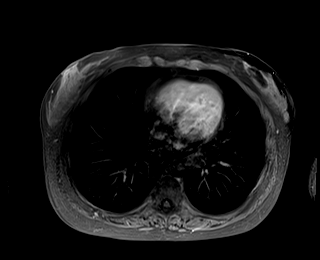

[Series 27: T1 dynamic · axial · 3.0mm · 1.19mm/px · z∈[-72,+141]mm · 2 of 72 slices shown (6 of 8)]
[im 1/72]
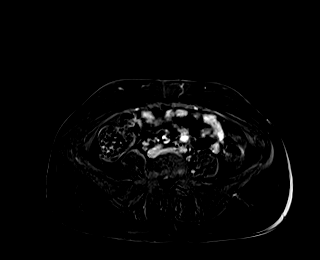
[im 72/72]
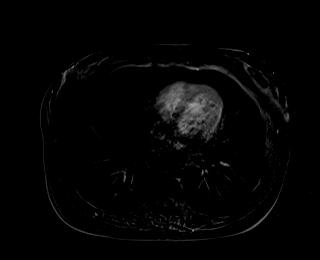

[Series 30: T1 dynamic post-contrast · axial · 3.0mm · 1.19mm/px · z∈[-240,-27]mm · 2 of 72 slices shown (5 of 6)]
[im 1/72]
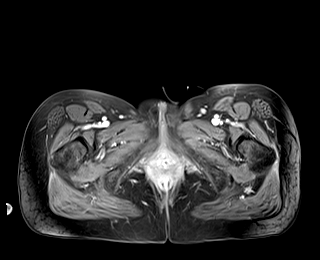
[im 72/72]
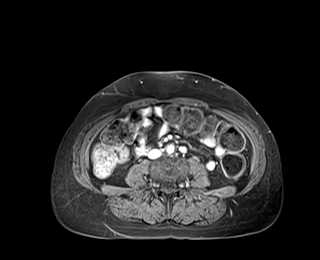

[Series 31: T1 dynamic · axial · 3.0mm · 1.19mm/px · z∈[-240,-27]mm · 2 of 72 slices shown (7 of 8)]
[im 1/72]
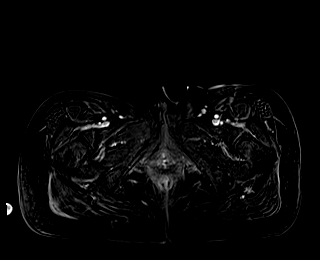
[im 72/72]
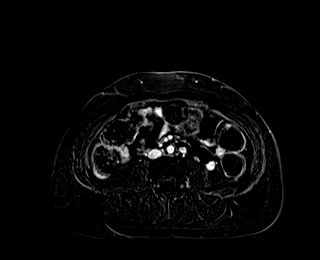

[Series 32: T1 dynamic post-contrast · axial · 3.0mm · 1.19mm/px · z∈[-72,+141]mm · 2 of 72 slices shown (6 of 6)]
[im 1/72]
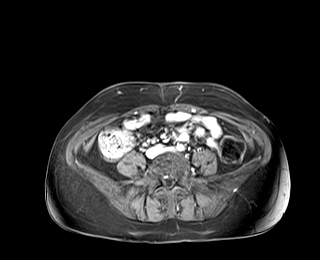
[im 72/72]
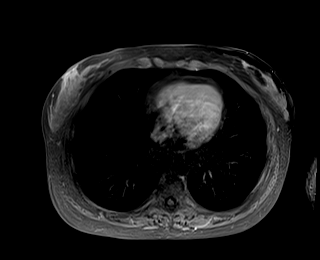

[Series 33: T1 dynamic · axial · 3.0mm · 1.19mm/px · z∈[-72,+141]mm · 2 of 72 slices shown (8 of 8)]
[im 1/72]
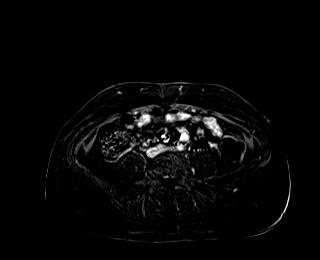
[im 72/72]
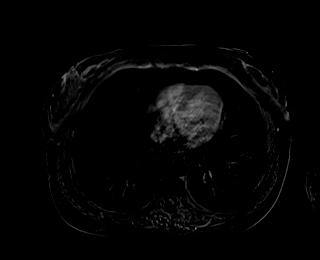

[48 of 48 positions shown; findings below may reference images not displayed]

FINDINGS: COMBINED FINDINGS FOR BOTH MR ABDOMEN AND PELVIS

Lower chest: The lung bases are clear of an acute process. No
pulmonary lesions or pleural effusions. No pericardial effusion.

Hepatobiliary: No hepatic lesions or intrahepatic biliary
dilatation. The gallbladder is surgically absent. No common bile
duct dilatation.

Pancreas:  No mass, inflammation or ductal dilatation.

Spleen:  Normal size. No focal lesions.

Adrenals/Urinary Tract:  The adrenal glands are normal.

Stable 3.8 x 3.0 x 2.5 cm septated cystic lesion projecting off the
upper pole region of the left kidney. The uppermost portion of this
lesion demonstrates increased T1 signal intensity suggesting
hemorrhage or proteinaceous debris. No contrast enhancement is
identified. Very thin septations and very thin wall showing
enhancement postcontrast. No nodularity or nodular enhancement. The
lesion is diffusion negative.

Small bilateral simple appearing renal cysts. No new renal lesions.

Stomach/Bowel: The stomach, duodenum, small bowel and colon are
unremarkable. No colonic lesions or findings for obstruction.
Moderate sigmoid colon diverticulosis.

Vascular/Lymphatic: The aorta and branch vessels are unremarkable.
The major venous structures are patent. No mesenteric or
retroperitoneal mass or adenopathy.

Reproductive: The uterus is surgically absent. Both ovaries are
still present and appear normal.

Other: No pelvic mass or pelvic adenopathy. No free pelvic fluid
collections. No inguinal mass.

Musculoskeletal: No significant bony findings.
IMPRESSION: 1. Stable 3.8 x 3.0 x 2.5 cm septated cystic lesion projecting off
the upper pole region of the left kidney. This is a Bosniak 2F
lesion. Recommend follow-up MR examination in 1 year.
2. Small bilateral simple appearing renal cysts.
3. Status post hysterectomy. Both ovaries are still present and
appear normal.
4. No abdominal or retroperitoneal mass or adenopathy.

## 2020-12-23 MED ORDER — GADOBUTROL 1 MMOL/ML IV SOLN
5.0000 mL | Freq: Once | INTRAVENOUS | Status: AC | PRN
  Administered 2020-12-23: 5 mL via INTRAVENOUS

## 2021-01-05 NOTE — Telephone Encounter (Signed)
error 

## 2021-01-06 ENCOUNTER — Ambulatory Visit: Payer: BC Managed Care – PPO | Admitting: Urology

## 2021-01-17 ENCOUNTER — Telehealth: Payer: Self-pay | Admitting: Internal Medicine

## 2021-01-17 NOTE — Telephone Encounter (Signed)
Pt is on a wait list of any cancellations to move her appointment up sooner. She is asking for a prescription of zofran. Please advise. [phone]

## 2021-01-18 ENCOUNTER — Other Ambulatory Visit: Payer: Self-pay | Admitting: Gastroenterology

## 2021-01-18 MED ORDER — ONDANSETRON 4 MG PO TBDP: 4.0000 mg | ORAL_TABLET | Freq: Three times a day (TID) | ORAL | 0 refills | Status: DC | PRN

## 2021-01-18 NOTE — Addendum Note (Signed)
Addended by: [name] on: 01/18/2021 08:40 AM   Modules accepted: Orders

## 2021-01-18 NOTE — Telephone Encounter (Signed)
Sent to [facility]. She was seen in the hospital in April 2022.

## 2021-01-18 NOTE — Telephone Encounter (Signed)
Phoned and advised the pt of her Rx for zofran was sent in to her pharmacy

## 2021-01-24 ENCOUNTER — Other Ambulatory Visit: Payer: Self-pay | Admitting: Gastroenterology

## 2021-01-28 ENCOUNTER — Other Ambulatory Visit: Payer: Self-pay | Admitting: Gastroenterology

## 2021-02-13 ENCOUNTER — Other Ambulatory Visit: Payer: Self-pay | Admitting: Gastroenterology

## 2021-02-20 ENCOUNTER — Other Ambulatory Visit: Payer: Self-pay | Admitting: Gastroenterology

## 2021-02-28 ENCOUNTER — Encounter: Payer: Self-pay | Admitting: Student in an Organized Health Care Education/Training Program

## 2021-02-28 ENCOUNTER — Ambulatory Visit
Payer: BC Managed Care – PPO | Attending: Student in an Organized Health Care Education/Training Program | Admitting: Student in an Organized Health Care Education/Training Program

## 2021-02-28 ENCOUNTER — Other Ambulatory Visit: Payer: Self-pay

## 2021-02-28 VITALS — BP 150/74 | HR 81 | Temp 97.2°F | Resp 16 | Ht 61.0 in | Wt 110.0 lb

## 2021-02-28 DIAGNOSIS — M47818 Spondylosis without myelopathy or radiculopathy, sacral and sacrococcygeal region: Secondary | ICD-10-CM | POA: Diagnosis not present

## 2021-02-28 DIAGNOSIS — G894 Chronic pain syndrome: Secondary | ICD-10-CM | POA: Insufficient documentation

## 2021-02-28 DIAGNOSIS — M533 Sacrococcygeal disorders, not elsewhere classified: Secondary | ICD-10-CM | POA: Diagnosis present

## 2021-02-28 MED ORDER — OXYCODONE-ACETAMINOPHEN 5-325 MG PO TABS: 1.0000 | ORAL_TABLET | Freq: Three times a day (TID) | ORAL | 0 refills | Status: AC | PRN

## 2021-02-28 NOTE — Progress Notes (Signed)
PROVIDER NOTE: Information contained herein reflects review and annotations entered in association with encounter. Interpretation of such information and data should be left to medically-trained personnel. Information provided to patient can be located elsewhere in the medical record under "Patient Instructions". Document created using STT-dictation technology, any transcriptional errors that may result from process are unintentional.    Patient: [name]  Service Category: E/M  Provider: Gillis Santa, MD  DOB: [birth year]  DOS: 02/28/2021  Specialty: Interventional Pain Management  MRN: [id number]  Setting: Ambulatory outpatient  PCP: Vidal Schwalbe, MD  Type: Established Patient    Referring Provider: Vidal Schwalbe, MD  Location: Office  Delivery: Face-to-face     HPI  [name], a 50 y.o. year old female, is here today because of her Sacroiliac joint pain [M53.3]. [name] primary complain today is Back Pain (lower) Last encounter: My last encounter with her was on 11/29/20 Pertinent problems: [name] has Lumbar radiculopathy; Lumbar degenerative disc disease; Lumbar spondylosis; Chronic low back pain with bilateral sciatica; Chronic pain syndrome; Lumbar sprain; Chronic migraine without aura without status migrainosus, not intractable; SI joint arthritis; and Long term prescription opiate use on their pertinent problem list. Pain Assessment: Severity of Chronic pain is reported as a 6 /10. Location: Back Lower/down left leg to big toe. Onset: More than a month ago. Quality: Sharp, Shooting, Pounding, Constant. Timing: Constant. Modifying factor(s): meds. Vitals:  height is _0  (1.549 m) and weight is 110 lb (49.9 kg). Her temporal temperature is 97.2 F (36.2 C) (abnormal). Her blood pressure is 150/74 (abnormal) and her pulse is 81. Her respiration is 16 and oxygen saturation is 100%.   Reason for encounter: medication management.  And worsening bilateral SI joint  pain.  Patient presents today for medication management.   Of note, patient states that she received 85% pain relief in her bilateral SI joint, buttock pain for 3.5 months after her SI joint injection that was performed on 10/31/2020.  She is now endorsing return of buttock and SI joint pain.  She is requesting a repeat bilateral SI joint injection. Risks and benefits reviewed and patient would like to proceed.   Pharmacotherapy Assessment  Analgesic:  01/28/2021  11/29/2020   2  Oxycodone-Acetaminophen 5-325 75.00  19  Un Pha  [id number]   Nor (6917)  0/0  29.61 MME  Comm Ins  [place]     Monitoring: [place] PMP: PDMP reviewed during this encounter.       Pharmacotherapy: No side-effects or adverse reactions reported. Compliance: No problems identified. Effectiveness: Clinically acceptable.  UDS:  Summary  Date Value Ref Range Status  08/24/2020 Note  Final    Comment:    ==================================================================== [employer] [facility] 13 (MW) ==================================================================== Test                             Result       Flag       Units  Drug Present and Declared for Prescription Verification   Oxazepam                       53           EXPECTED   ng/mg creat   Temazepam                      114          EXPECTED  ng/mg creat    Oxazepam and temazepam are expected metabolites of diazepam.    Oxazepam is also an expected metabolite of other benzodiazepine    drugs, including chlordiazepoxide, prazepam, clorazepate, halazepam,    and temazepam.  Oxazepam and temazepam are available as scheduled    prescription medications.    7-aminoclonazepam              318          EXPECTED   ng/mg creat    7-aminoclonazepam is an expected metabolite of clonazepam. Source of    clonazepam is a scheduled prescription medication.    Oxycodone                      7108         EXPECTED   ng/mg creat   Oxymorphone                    1567          EXPECTED   ng/mg creat   Noroxycodone                   3353         EXPECTED   ng/mg creat   Noroxymorphone                 227          EXPECTED   ng/mg creat    Sources of oxycodone are scheduled prescription medications.    Oxymorphone, noroxycodone, and noroxymorphone are expected    metabolites of oxycodone. Oxymorphone is also available as a    scheduled prescription medication.  ==================================================================== Test                      Result    Flag   Units      Ref Range   Creatinine              49               mg/dL      >=20 ==================================================================== Declared Medications:  The flagging and interpretation on this report are based on the  following declared medications.  Unexpected results may arise from  inaccuracies in the declared medications.   **Note: The testing scope of this panel includes these medications:   Clonazepam (Klonopin)  Oxycodone (Percocet)  Temazepam (Restoril)   **Note: The testing scope of this panel does not include the  following reported medications:   Acetaminophen (Percocet)  Fluticasone (Flonase)  Metoprolol (Toprol)  Ondansetron (Zofran)  Prednisone (Deltasone) ==================================================================== For clinical consultation, please call [phone]. ====================================================================       ROS  Constitutional: Denies any fever or chills Gastrointestinal: No reported hemesis, hematochezia, vomiting, or acute GI distress Musculoskeletal:  Bilateral SI joint, buttock pain Neurological: No reported episodes of acute onset apraxia, aphasia, dysarthria, agnosia, amnesia, paralysis, loss of coordination, or loss of consciousness  Medication Review  clonazePAM, dicyclomine, famotidine, hydrOXYzine, metoprolol succinate, ondansetron, oxyCODONE-acetaminophen, promethazine, and  temazepam  History Review  Allergy: [name] is allergic to buprenorphine, penicillins, aripiprazole, seroquel [quetiapine], cefaclor, doxycycline, gabapentin, ibuprofen, levofloxacin, lisinopril, macrobid [nitrofurantoin monohyd macro], and naproxen. Drug: [name]  reports no history of drug use. Alcohol:  reports no history of alcohol use. Tobacco:  reports that she has been smoking cigarettes. She has a 20.00 pack-year smoking history. She has never used smokeless tobacco. Social: [name]  reports that she  has been smoking cigarettes. She has a 20.00 pack-year smoking history. She has never used smokeless tobacco. She reports that she does not drink alcohol and does not use drugs. Medical:  has a past medical history of Chronic back pain, Colitis, Hypertension, Migraine, and Plumbism. Surgical: [name]  has a past surgical history that includes Abdominal hysterectomy; Cholecystectomy; Hand surgery; Back surgery; Colonoscopy with esophagogastroduodenoscopy (egd) (07/2009); and Esophagogastroduodenoscopy (06/2007). Family: family history includes Cancer in an other family member; Diabetes in an other family member; Seizures in an other family member; Stroke in an other family member.  Laboratory Chemistry Profile   Renal Lab Results  Component Value Date   BUN 11 11/14/2020   CREATININE 0.75 11/14/2020   GFRAA >60 01/17/2020   GFRNONAA >60 11/14/2020     Hepatic Lab Results  Component Value Date   AST 29 11/14/2020   ALT 25 11/14/2020   ALBUMIN 3.9 11/14/2020   ALKPHOS 108 11/14/2020   LIPASE 25 11/14/2020     Electrolytes Lab Results  Component Value Date   NA 139 11/14/2020   K 3.7 11/14/2020   CL 104 11/14/2020   CALCIUM 9.3 11/14/2020     Bone No results found for: VD25OH, [id number], [id number], OE7035KK9, 25OHVITD1, 25OHVITD2, 25OHVITD3, TESTOFREE, TESTOSTERONE   Inflammation (CRP: Acute Phase) (ESR: Chronic Phase) Lab Results  Component Value Date    LATICACIDVEN 1.8 11/12/2020       Note: Above Lab results reviewed.  Recent Imaging Review  MR ABDOMEN WWO CONTRAST CLINICAL DATA:  Follow-up multi septated cystic lesion involving the upper pole region of the left kidney. Mid and lower abdominal pain.  EXAM: MRI ABDOMEN AND PELVIS WITHOUT AND WITH CONTRAST  TECHNIQUE: Multiplanar multisequence MR imaging of the abdomen and pelvis was performed both before and after the administration of intravenous contrast.  CONTRAST:  48m GADAVIST GADOBUTROL 1 MMOL/ML IV SOLN  COMPARISON:  MRI 08/03/2020 and multiple prior CT scans.  FINDINGS: COMBINED FINDINGS FOR BOTH MR ABDOMEN AND PELVIS  Lower chest: The lung bases are clear of an acute process. No pulmonary lesions or pleural effusions. No pericardial effusion.  Hepatobiliary: No hepatic lesions or intrahepatic biliary dilatation. The gallbladder is surgically absent. No common bile duct dilatation.  Pancreas:  No mass, inflammation or ductal dilatation.  Spleen:  Normal size. No focal lesions.  Adrenals/Urinary Tract:  The adrenal glands are normal.  Stable 3.8 x 3.0 x 2.5 cm septated cystic lesion projecting off the upper pole region of the left kidney. The uppermost portion of this lesion demonstrates increased T1 signal intensity suggesting hemorrhage or proteinaceous debris. No contrast enhancement is identified. Very thin septations and very thin wall showing enhancement postcontrast. No nodularity or nodular enhancement. The lesion is diffusion negative.  Small bilateral simple appearing renal cysts. No new renal lesions.  Stomach/Bowel: The stomach, duodenum, small bowel and colon are unremarkable. No colonic lesions or findings for obstruction. Moderate sigmoid colon diverticulosis.  Vascular/Lymphatic: The aorta and branch vessels are unremarkable. The major venous structures are patent. No mesenteric or retroperitoneal mass or adenopathy.  Reproductive: The  uterus is surgically absent. Both ovaries are still present and appear normal.  Other: No pelvic mass or pelvic adenopathy. No free pelvic fluid collections. No inguinal mass.  Musculoskeletal: No significant bony findings.  IMPRESSION: 1. Stable 3.8 x 3.0 x 2.5 cm septated cystic lesion projecting off the upper pole region of the left kidney. This is a Bosniak 57F lesion. Recommend follow-up MR examination in 1 year.  2. Small bilateral simple appearing renal cysts. 3. Status post hysterectomy. Both ovaries are still present and appear normal. 4. No abdominal or retroperitoneal mass or adenopathy.  Electronically Signed   By: Marijo Sanes M.D.   On: 12/25/2020 14:35 MR PELVIS W WO CONTRAST CLINICAL DATA:  Follow-up multi septated cystic lesion involving the upper pole region of the left kidney. Mid and lower abdominal pain.  EXAM: MRI ABDOMEN AND PELVIS WITHOUT AND WITH CONTRAST  TECHNIQUE: Multiplanar multisequence MR imaging of the abdomen and pelvis was performed both before and after the administration of intravenous contrast.  CONTRAST:  35m GADAVIST GADOBUTROL 1 MMOL/ML IV SOLN  COMPARISON:  MRI 08/03/2020 and multiple prior CT scans.  FINDINGS: COMBINED FINDINGS FOR BOTH MR ABDOMEN AND PELVIS  Lower chest: The lung bases are clear of an acute process. No pulmonary lesions or pleural effusions. No pericardial effusion.  Hepatobiliary: No hepatic lesions or intrahepatic biliary dilatation. The gallbladder is surgically absent. No common bile duct dilatation.  Pancreas:  No mass, inflammation or ductal dilatation.  Spleen:  Normal size. No focal lesions.  Adrenals/Urinary Tract:  The adrenal glands are normal.  Stable 3.8 x 3.0 x 2.5 cm septated cystic lesion projecting off the upper pole region of the left kidney. The uppermost portion of this lesion demonstrates increased T1 signal intensity suggesting hemorrhage or proteinaceous debris. No contrast  enhancement is identified. Very thin septations and very thin wall showing enhancement postcontrast. No nodularity or nodular enhancement. The lesion is diffusion negative.  Small bilateral simple appearing renal cysts. No new renal lesions.  Stomach/Bowel: The stomach, duodenum, small bowel and colon are unremarkable. No colonic lesions or findings for obstruction. Moderate sigmoid colon diverticulosis.  Vascular/Lymphatic: The aorta and branch vessels are unremarkable. The major venous structures are patent. No mesenteric or retroperitoneal mass or adenopathy.  Reproductive: The uterus is surgically absent. Both ovaries are still present and appear normal.  Other: No pelvic mass or pelvic adenopathy. No free pelvic fluid collections. No inguinal mass.  Musculoskeletal: No significant bony findings.  IMPRESSION: 1. Stable 3.8 x 3.0 x 2.5 cm septated cystic lesion projecting off the upper pole region of the left kidney. This is a Bosniak 59F lesion. Recommend follow-up MR examination in 1 year. 2. Small bilateral simple appearing renal cysts. 3. Status post hysterectomy. Both ovaries are still present and appear normal. 4. No abdominal or retroperitoneal mass or adenopathy.  Electronically Signed   By: PMarijo SanesM.D.   On: 12/25/2020 14:35 Note: Reviewed        Physical Exam  General appearance: Well nourished, well developed, and well hydrated. In no apparent acute distress Mental status: Alert, oriented x 3 (person, place, & time)       Respiratory: No evidence of acute respiratory distress Eyes: PERLA Vitals: BP (!) 150/74 (BP Location: Right Arm, Patient Position: Sitting, Cuff Size: Normal)   Pulse 81   Temp (!) 97.2 F (36.2 C) (Temporal)   Resp 16   Ht _0  (1.549 m)   Wt 110 lb (49.9 kg)   SpO2 100%   BMI 20.78 kg/m  BMI: Estimated body mass index is 20.78 kg/m as calculated from the following:   Height as of this encounter: _1  (1.549 m).   Weight  as of this encounter: 110 lb (49.9 kg). Ideal: Ideal body weight: 47.8 kg (105 lb 6.1 oz) Adjusted ideal body weight: 48.6 kg (107 lb 3.7 oz)  Lumbar Spine Area Exam  Skin &  Axial Inspection: No masses, redness, or swelling Alignment: Symmetrical Functional ROM: Pain restricted ROM       Stability: No instability detected Muscle Tone/Strength: Functionally intact. No obvious neuro-muscular anomalies detected. Sensory (Neurological): Musculoskeletal pain pattern   Provocative Tests: Hyperextension/rotation test: deferred today       Lumbar quadrant test (Kemp's test): deferred today       Lateral bending test: deferred today       Patrick's Maneuver: (+) for bilateral S-I arthralgia             FABER* test: (+) for bilateral S-I arthralgia             S-I anterior distraction/compression test: (+)   S-I arthralgia/arthropathy S-I lateral compression test: (+)   S-I arthralgia/arthropathy   Gait & Posture Assessment  Ambulation: Unassisted Gait: Antalgic Posture: Difficulty standing up straight, due to pain   Lower Extremity Exam      Side: Right lower extremity   Side: Left lower extremity  Stability: No instability observed           Stability: No instability observed          Skin & Extremity Inspection: Skin color, temperature, and hair growth are WNL. No peripheral edema or cyanosis. No masses, redness, swelling, asymmetry, or associated skin lesions. No contractures.   Skin & Extremity Inspection: Skin color, temperature, and hair growth are WNL. No peripheral edema or cyanosis. No masses, redness, swelling, asymmetry, or associated skin lesions. No contractures.  Functional ROM: Pain restricted ROM for hip joint           Functional ROM: Pain restricted ROM                  Muscle Tone/Strength: Functionally intact. No obvious neuro-muscular anomalies detected.   Muscle Tone/Strength: Functionally intact. No obvious neuro-muscular anomalies detected.  Sensory (Neurological):  Referred pain pattern from SI joint         Sensory (Neurological): Referred pain pattern from SI joint        DTR: Patellar: deferred today Achilles: deferred today Plantar: deferred today   DTR: Patellar: deferred today Achilles: deferred today Plantar: deferred today  Palpation: No palpable anomalies   Palpation: No palpable anomalies     Assessment   Status Diagnosis  Having a Flare-up Having a Flare-up Having a Flare-up 1. Sacroiliac joint pain   2. SI joint arthritis   3. Chronic pain syndrome       Plan of Care   [name] has a current medication list which includes the following long-term medication(s): clonazepam, dicyclomine, famotidine, and promethazine.  Pharmacotherapy (Medications Ordered): Meds ordered this encounter  Medications   oxyCODONE-acetaminophen (PERCOCET/ROXICET) 5-325 MG tablet    Sig: Take 1 tablet by mouth every 8 (eight) hours as needed for severe pain.    Dispense:  75 tablet    Refill:  0   oxyCODONE-acetaminophen (PERCOCET/ROXICET) 5-325 MG tablet    Sig: Take 1 tablet by mouth every 8 (eight) hours as needed for severe pain.    Dispense:  75 tablet    Refill:  0   oxyCODONE-acetaminophen (PERCOCET/ROXICET) 5-325 MG tablet    Sig: Take 1 tablet by mouth every 8 (eight) hours as needed for severe pain.    Dispense:  75 tablet    Refill:  0   Orders:  Orders Placed This Encounter  Procedures   SACROILIAC JOINT INJECTION    Standing Status:   Future  Standing Expiration Date:   03/31/2021    Scheduling Instructions:     Side: Bilateral     Sedation: WITH     Timeframe: ASAP    Order Specific Question:   Where will this procedure be performed?    Answer:   [facility]   Follow-up plan:   Return in about 2 weeks (around 03/14/2021) for B/L SI-J, moderate sedation (ECT suite).   Recent Visits No visits were found meeting these conditions. Showing recent visits within past 90 days and meeting all other  requirements Today's Visits Date Type Provider Dept  02/28/21 Office Visit Gillis Santa, MD [facility]  Showing today's visits and meeting all other requirements Future Appointments No visits were found meeting these conditions. Showing future appointments within next 90 days and meeting all other requirements I discussed the assessment and treatment plan with the patient. The patient was provided an opportunity to ask questions and all were answered. The patient agreed with the plan and demonstrated an understanding of the instructions.  Patient advised to call back or seek an in-person evaluation if the symptoms or condition worsens.  Duration of encounter: 35 minutes.  Note by: Gillis Santa, MD Date: 02/28/2021; Time: 1:43 PM

## 2021-02-28 NOTE — Progress Notes (Signed)
Nursing Pain Medication Assessment:  Safety precautions to be maintained throughout the outpatient stay will include: orient to surroundings, keep bed in low position, maintain call bell within reach at all times, provide assistance with transfer out of bed and ambulation.  Medication Inspection Compliance: Ms. Anton did not comply with our request to bring her pills to be counted. She was reminded that bringing the medication bottles, even when empty, is a requirement.  Medication: None brought in. Pill/Patch Count: None available to be counted. Bottle Appearance: No container available. Did not bring bottle(s) to appointment. Filled Date: N/A Last Medication intake:  Yesterday

## 2021-02-28 NOTE — Patient Instructions (Signed)
Pain Management Discharge Instructions  General Discharge Instructions :  If you need to reach your doctor call: Monday-Friday 8:00 am - 4:00 pm at [phone] or toll free [phone].  After clinic hours [phone] to have operator reach doctor.  Bring all of your medication bottles to all your appointments in the pain clinic.  To cancel or reschedule your appointment with Pain Management please remember to call 24 hours in advance to avoid a fee.  Refer to the educational materials which you have been given on: General Risks, I had my Procedure. Discharge Instructions, Post Sedation.  Post Procedure Instructions:  The drugs you were given will stay in your system until tomorrow, so for the next 24 hours you should not drive, make any legal decisions or drink any alcoholic beverages.  You may eat anything you prefer, but it is better to start with liquids then soups and crackers, and gradually work up to solid foods.  Please notify your doctor immediately if you have any unusual bleeding, trouble breathing or pain that is not related to your normal pain.  Depending on the type of procedure that was done, some parts of your body may feel week and/or numb.  This usually clears up by tonight or the next day.  Walk with the use of an assistive device or accompanied by an adult for the 24 hours.  You may use ice on the affected area for the first 24 hours.  Put ice in a Ziploc bag and cover with a towel and place against area 15 minutes on 15 minutes off.  You may switch to heat after 24 hours.GENERAL RISKS AND COMPLICATIONS  What are the risk, side effects and possible complications? Generally speaking, most procedures are safe.  However, with any procedure there are risks, side effects, and the possibility of complications.  The risks and complications are dependent upon the sites that are lesioned, or the type of nerve block to be performed.  The closer the procedure is to the spine,  the more serious the risks are.  Great care is taken when placing the radio frequency needles, block needles or lesioning probes, but sometimes complications can occur. Infection: Any time there is an injection through the skin, there is a risk of infection.  This is why sterile conditions are used for these blocks.  There are four possible types of infection. Localized skin infection. Central Nervous System Infection-This can be in the form of Meningitis, which can be deadly. Epidural Infections-This can be in the form of an epidural abscess, which can cause pressure inside of the spine, causing compression of the spinal cord with subsequent paralysis. This would require an emergency surgery to decompress, and there are no guarantees that the patient would recover from the paralysis. Discitis-This is an infection of the intervertebral discs.  It occurs in about 1% of discography procedures.  It is difficult to treat and it may lead to surgery.        2. Pain: the needles have to go through skin and soft tissues, will cause soreness.       3. Damage to internal structures:  The nerves to be lesioned may be near blood vessels or    other nerves which can be potentially damaged.       4. Bleeding: Bleeding is more common if the patient is taking blood thinners such as  aspirin, Coumadin, Ticiid, Plavix, etc., or if he/she have some genetic predisposition  such as hemophilia. Bleeding into the spinal  canal can cause compression of the spinal  cord with subsequent paralysis.  This would require an emergency surgery to  decompress and there are no guarantees that the patient would recover from the  paralysis.       5. Pneumothorax:  Puncturing of a lung is a possibility, every time a needle is introduced in  the area of the chest or upper back.  Pneumothorax refers to free air around the  collapsed lung(s), inside of the thoracic cavity (chest cavity).  Another two possible  complications related to a similar  event would include: Hemothorax and Chylothorax.   These are variations of the Pneumothorax, where instead of air around the collapsed  lung(s), you may have blood or chyle, respectively.       6. Spinal headaches: They may occur with any procedures in the area of the spine.       7. Persistent CSF (Cerebro-Spinal Fluid) leakage: This is a rare problem, but may occur  with prolonged intrathecal or epidural catheters either due to the formation of a fistulous  track or a dural tear.       8. Nerve damage: By working so close to the spinal cord, there is always a possibility of  nerve damage, which could be as serious as a permanent spinal cord injury with  paralysis.       9. Death:  Although rare, severe deadly allergic reactions known as "Anaphylactic  reaction" can occur to any of the medications used.      10. Worsening of the symptoms:  We can always make thing worse.  What are the chances of something like this happening? Chances of any of this occuring are extremely low.  By statistics, you have more of a chance of getting killed in a motor vehicle accident: while driving to the hospital than any of the above occurring .  Nevertheless, you should be aware that they are possibilities.  In general, it is similar to taking a shower.  Everybody knows that you can slip, hit your head and get killed.  Does that mean that you should not shower again?  Nevertheless always keep in mind that statistics do not mean anything if you happen to be on the wrong side of them.  Even if a procedure has a 1 (one) in a 1,000,000 (million) chance of going wrong, it you happen to be that one..Also, keep in mind that by statistics, you have more of a chance of having something go wrong when taking medications.  Who should not have this procedure? If you are on a blood thinning medication (e.g. Coumadin, Plavix, see list of "Blood Thinners"), or if you have an active infection going on, you should not have the procedure.   If you are taking any blood thinners, please inform your physician.  How should I prepare for this procedure? Do not eat or drink anything at least six hours prior to the procedure. Bring a driver with you .  It cannot be a taxi. Come accompanied by an adult that can drive you back, and that is strong enough to help you if your legs get weak or numb from the local anesthetic. Take all of your medicines the morning of the procedure with just enough water to swallow them. If you have diabetes, make sure that you are scheduled to have your procedure done first thing in the morning, whenever possible. If you have diabetes, take only half of your insulin dose and notify our nurse  that you have done so as soon as you arrive at the clinic. If you are diabetic, but only take blood sugar pills (oral hypoglycemic), then do not take them on the morning of your procedure.  You may take them after you have had the procedure. Do not take aspirin or any aspirin-containing medications, at least eleven (11) days prior to the procedure.  They may prolong bleeding. Wear loose fitting clothing that may be easy to take off and that you would not mind if it got stained with Betadine or blood. Do not wear any jewelry or perfume Remove any nail coloring.  It will interfere with some of our monitoring equipment.  NOTE: Remember that this is not meant to be interpreted as a complete list of all possible complications.  Unforeseen problems may occur.  BLOOD THINNERS The following drugs contain aspirin or other products, which can cause increased bleeding during surgery and should not be taken for 2 weeks prior to and 1 week after surgery.  If you should need take something for relief of minor pain, you may take acetaminophen which is found in Tylenol,m Datril, Anacin-3 and Panadol. It is not blood thinner. The products listed below are.  Do not take any of the products listed below in addition to any listed on your  instruction sheet.  A.P.C or A.P.C with Codeine Codeine Phosphate Capsules #3 Ibuprofen Ridaura  ABC compound Congesprin Imuran rimadil  Advil Cope Indocin Robaxisal  Alka-Seltzer Effervescent Pain Reliever and Antacid Coricidin or Coricidin-D  Indomethacin Rufen  Alka-Seltzer plus Cold Medicine Cosprin Ketoprofen S-A-C Tablets  Anacin Analgesic Tablets or Capsules Coumadin Korlgesic Salflex  Anacin Extra Strength Analgesic tablets or capsules CP-2 Tablets Lanoril Salicylate  Anaprox Cuprimine Capsules Levenox Salocol  Anexsia-D Dalteparin Magan Salsalate  Anodynos Darvon compound Magnesium Salicylate Sine-off  Ansaid Dasin Capsules Magsal Sodium Salicylate  Anturane Depen Capsules Marnal Soma  APF Arthritis pain formula Dewitt's Pills Measurin Stanback  Argesic Dia-Gesic Meclofenamic Sulfinpyrazone  Arthritis Bayer Timed Release Aspirin Diclofenac Meclomen Sulindac  Arthritis pain formula Anacin Dicumarol Medipren Supac  Analgesic (Safety coated) Arthralgen Diffunasal Mefanamic Suprofen  Arthritis Strength Bufferin Dihydrocodeine Mepro Compound Suprol  Arthropan liquid Dopirydamole Methcarbomol with Aspirin Synalgos  ASA tablets/Enseals Disalcid Micrainin Tagament  Ascriptin Doan's Midol Talwin  Ascriptin A/D Dolene Mobidin Tanderil  Ascriptin Extra Strength Dolobid Moblgesic Ticlid  Ascriptin with Codeine Doloprin or Doloprin with Codeine Momentum Tolectin  Asperbuf Duoprin Mono-gesic Trendar  Aspergum Duradyne Motrin or Motrin IB Triminicin  Aspirin plain, buffered or enteric coated Durasal Myochrisine Trigesic  Aspirin Suppositories Easprin Nalfon Trillsate  Aspirin with Codeine Ecotrin Regular or Extra Strength Naprosyn Uracel  Atromid-S Efficin Naproxen Ursinus  Auranofin Capsules Elmiron Neocylate Vanquish  Axotal Emagrin Norgesic Verin  Azathioprine Empirin or Empirin with Codeine Normiflo Vitamin E  Azolid Emprazil Nuprin Voltaren  Bayer Aspirin plain, buffered or  children's or timed BC Tablets or powders Encaprin Orgaran Warfarin Sodium  Buff-a-Comp Enoxaparin Orudis Zorpin  Buff-a-Comp with Codeine Equegesic Os-Cal-Gesic   Buffaprin Excedrin plain, buffered or Extra Strength Oxalid   Bufferin Arthritis Strength Feldene Oxphenbutazone   Bufferin plain or Extra Strength Feldene Capsules Oxycodone with Aspirin   Bufferin with Codeine Fenoprofen Fenoprofen Pabalate or Pabalate-SF   Buffets II Flogesic Panagesic   Buffinol plain or Extra Strength Florinal or Florinal with Codeine Panwarfarin   Buf-Tabs Flurbiprofen Penicillamine   Butalbital Compound Four-way cold tablets Penicillin   Butazolidin Fragmin Pepto-Bismol   Carbenicillin Geminisyn Percodan   Carna Arthritis Reliever  Geopen Persantine   Carprofen Gold's salt Persistin   Chloramphenicol Goody's Phenylbutazone   Chloromycetin Haltrain Piroxlcam   Clmetidine heparin Plaquenil   Cllnoril Hyco-pap Ponstel   Clofibrate Hydroxy chloroquine Propoxyphen         Before stopping any of these medications, be sure to consult the physician who ordered them.  Some, such as Coumadin (Warfarin) are ordered to prevent or treat serious conditions such as "deep thrombosis", "pumonary embolisms", and other heart problems.  The amount of time that you may need off of the medication may also vary with the medication and the reason for which you were taking it.  If you are taking any of these medications, please make sure you notify your pain physician before you undergo any procedures.         Sacroiliac (SI) Joint Injection Patient Information  Description: The sacroiliac joint connects the scrum (very low back and tailbone) to the ilium (a pelvic bone which also forms half of the hip joint).  Normally this joint experiences very little motion.  When this joint becomes inflamed or unstable low back and or hip and pelvis pain may result.  Injection of this joint with local anesthetics (numbing medicines)  and steroids can provide diagnostic information and reduce pain.  This injection is performed with the aid of x-ray guidance into the tailbone area while you are lying on your stomach.   You may experience an electrical sensation down the leg while this is being done.  You may also experience numbness.  We also may ask if we are reproducing your normal pain during the injection.  Conditions which may be treated SI injection:  Low back, buttock, hip or leg pain  Preparation for the Injection:  Do not eat any solid food or dairy products within 8 hours of your appointment.  You may drink clear liquids up to 3 hours before appointment.  Clear liquids include water, black coffee, juice or soda.  No milk or cream please. You may take your regular medications, including pain medications with a sip of water before your appointment.  Diabetics should hold regular insulin (if take separately) and take 1/2 normal NPH dose the morning of the procedure.  Carry some sugar containing items with you to your appointment. A driver must accompany you and be prepared to drive you home after your procedure. Bring all of your current medications with you. An IV may be inserted and sedation may be given at the discretion of the physician. A blood pressure cuff, EKG and other monitors will often be applied during the procedure.  Some patients may need to have extra oxygen administered for a short period.  You will be asked to provide medical information, including your allergies, prior to the procedure.  We must know immediately if you are taking blood thinners (like Coumadin/Warfarin) or if you are allergic to IV iodine contrast (dye).  We must know if you could possible be pregnant.  Possible side effects:  Bleeding from needle site Infection (rare, may require surgery) Nerve injury (rare) Numbness & tingling (temporary) A brief convulsion or seizure Light-headedness (temporary) Pain at injection site (several  days) Decreased blood pressure (temporary) Weakness in the leg (temporary)   Call if you experience:  New onset weakness or numbness of an extremity below the injection site that last more than 8 hours. Hives or difficulty breathing ( go to the emergency room) Inflammation or drainage at the injection site Any new symptoms which are concerning to  you  Please note:  Although the local anesthetic injected can often make your back/ hip/ buttock/ leg feel good for several hours after the injections, the pain will likely return.  It takes 3-7 days for steroids to work in the sacroiliac area.  You may not notice any pain relief for at least that one week.  If effective, we will often do a series of three injections spaced 3-6 weeks apart to maximally decrease your pain.  After the initial series, we generally will wait some months before a repeat injection of the same type.  If you have any questions, please call [phone] [facility]

## 2021-03-13 ENCOUNTER — Ambulatory Visit (HOSPITAL_BASED_OUTPATIENT_CLINIC_OR_DEPARTMENT_OTHER): Payer: BC Managed Care – PPO | Admitting: Student in an Organized Health Care Education/Training Program

## 2021-03-13 ENCOUNTER — Encounter: Payer: Self-pay | Admitting: Student in an Organized Health Care Education/Training Program

## 2021-03-13 ENCOUNTER — Ambulatory Visit
Admission: RE | Admit: 2021-03-13 | Discharge: 2021-03-13 | Disposition: A | Discharge status: Home / Self Care | Payer: BC Managed Care – PPO | Source: Ambulatory Visit | Attending: Student in an Organized Health Care Education/Training Program | Admitting: Student in an Organized Health Care Education/Training Program

## 2021-03-13 ENCOUNTER — Other Ambulatory Visit: Payer: Self-pay

## 2021-03-13 DIAGNOSIS — M533 Sacrococcygeal disorders, not elsewhere classified: Secondary | ICD-10-CM | POA: Diagnosis present

## 2021-03-13 DIAGNOSIS — G894 Chronic pain syndrome: Secondary | ICD-10-CM

## 2021-03-13 MED ORDER — MIDAZOLAM BOLUS VIA INFUSION
1.0000 mg | INTRAVENOUS | Status: DC | PRN
  Filled 2021-03-13: qty 2

## 2021-03-13 MED ORDER — LIDOCAINE HCL (PF) 2 % IJ SOLN
INTRAMUSCULAR | Status: AC
  Filled 2021-03-13: qty 5

## 2021-03-13 MED ORDER — FENTANYL CITRATE (PF) 100 MCG/2ML IJ SOLN
25.0000 ug | INTRAMUSCULAR | Status: DC | PRN
  Administered 2021-03-13: 50 ug via INTRAVENOUS

## 2021-03-13 MED ORDER — DEXAMETHASONE SODIUM PHOSPHATE 10 MG/ML IJ SOLN
INTRAMUSCULAR | Status: AC
  Filled 2021-03-13: qty 1

## 2021-03-13 MED ORDER — MIDAZOLAM HCL 5 MG/5ML IJ SOLN
INTRAMUSCULAR | Status: AC
  Filled 2021-03-13: qty 5

## 2021-03-13 MED ORDER — FENTANYL CITRATE (PF) 100 MCG/2ML IJ SOLN
INTRAMUSCULAR | Status: AC
  Filled 2021-03-13: qty 2

## 2021-03-13 MED ORDER — ONDANSETRON HCL 4 MG/2ML IJ SOLN
INTRAMUSCULAR | Status: AC
  Filled 2021-03-13: qty 2

## 2021-03-13 MED ORDER — IOHEXOL 180 MG/ML  SOLN
10.0000 mL | Freq: Once | INTRAMUSCULAR | Status: AC
  Administered 2021-03-13: 10 mL via INTRA_ARTICULAR

## 2021-03-13 MED ORDER — METHYLPREDNISOLONE ACETATE 40 MG/ML IJ SUSP
INTRAMUSCULAR | Status: AC
  Filled 2021-03-13: qty 2

## 2021-03-13 MED ORDER — ROPIVACAINE HCL 2 MG/ML IJ SOLN
9.0000 mL | Freq: Once | INTRAMUSCULAR | Status: AC
  Administered 2021-03-13: 9 mL via PERINEURAL

## 2021-03-13 MED ORDER — LIDOCAINE HCL 2 % IJ SOLN
20.0000 mL | Freq: Once | INTRAMUSCULAR | Status: AC
  Administered 2021-03-13: 100 mg

## 2021-03-13 MED ORDER — IOHEXOL 180 MG/ML  SOLN
INTRAMUSCULAR | Status: AC
  Filled 2021-03-13: qty 10

## 2021-03-13 MED ORDER — ONDANSETRON HCL 4 MG/2ML IJ SOLN
4.0000 mg | Freq: Once | INTRAMUSCULAR | Status: AC
  Administered 2021-03-13: 4 mg via INTRAVENOUS

## 2021-03-13 MED ORDER — METHYLPREDNISOLONE ACETATE 80 MG/ML IJ SUSP: 80.0000 mg | Freq: Once | INTRAMUSCULAR | Status: DC

## 2021-03-13 MED ORDER — MIDAZOLAM HCL 2 MG/2ML IJ SOLN
1.0000 mg | INTRAMUSCULAR | Status: DC | PRN
  Administered 2021-03-13: 2 mg via INTRAVENOUS

## 2021-03-13 MED ORDER — ROPIVACAINE HCL 2 MG/ML IJ SOLN
INTRAMUSCULAR | Status: AC
  Filled 2021-03-13: qty 20

## 2021-03-13 NOTE — Addendum Note (Signed)
Addended by: Gillis Santa on: 03/13/2021 11:39 AM   Modules accepted: Orders

## 2021-03-13 NOTE — Patient Instructions (Signed)
Pain Management Discharge Instructions  General Discharge Instructions :  If you need to reach your doctor call: Monday-Friday 8:00 am - 4:00 pm at [phone] or toll free [phone].  After clinic hours [phone] to have operator reach doctor.  Bring all of your medication bottles to all your appointments in the pain clinic.  To cancel or reschedule your appointment with Pain Management please remember to call 24 hours in advance to avoid a fee.  Refer to the educational materials which you have been given on: General Risks, I had my Procedure. Discharge Instructions, Post Sedation.  Post Procedure Instructions:  The drugs you were given will stay in your system until tomorrow, so for the next 24 hours you should not drive, make any legal decisions or drink any alcoholic beverages.  You may eat anything you prefer, but it is better to start with liquids then soups and crackers, and gradually work up to solid foods.  Please notify your doctor immediately if you have any unusual bleeding, trouble breathing or pain that is not related to your normal pain.  Depending on the type of procedure that was done, some parts of your body may feel week and/or numb.  This usually clears up by tonight or the next day.  Walk with the use of an assistive device or accompanied by an adult for the 24 hours.  You may use ice on the affected area for the first 24 hours.  Put ice in a Ziploc bag and cover with a towel and place against area 15 minutes on 15 minutes off.  You may switch to heat after 24 hours.Sacroiliac (SI) Joint Injection Patient Information  Description: The sacroiliac joint connects the scrum (very low back and tailbone) to the ilium (a pelvic bone which also forms half of the hip joint).  Normally this joint experiences very little motion.  When this joint becomes inflamed or unstable low back and or hip and pelvis pain may result.  Injection of this joint with local anesthetics  (numbing medicines) and steroids can provide diagnostic information and reduce pain.  This injection is performed with the aid of x-ray guidance into the tailbone area while you are lying on your stomach.   You may experience an electrical sensation down the leg while this is being done.  You may also experience numbness.  We also may ask if we are reproducing your normal pain during the injection.  Conditions which may be treated SI injection:  Low back, buttock, hip or leg pain  Preparation for the Injection:  Do not eat any solid food or dairy products within 8 hours of your appointment.  You may drink clear liquids up to 3 hours before appointment.  Clear liquids include water, black coffee, juice or soda.  No milk or cream please. You may take your regular medications, including pain medications with a sip of water before your appointment.  Diabetics should hold regular insulin (if take separately) and take 1/2 normal NPH dose the morning of the procedure.  Carry some sugar containing items with you to your appointment. A driver must accompany you and be prepared to drive you home after your procedure. Bring all of your current medications with you. An IV may be inserted and sedation may be given at the discretion of the physician. A blood pressure cuff, EKG and other monitors will often be applied during the procedure.  Some patients may need to have extra oxygen administered for a short period.  You will   be asked to provide medical information, including your allergies, prior to the procedure.  We must know immediately if you are taking blood thinners (like Coumadin/Warfarin) or if you are allergic to IV iodine contrast (dye).  We must know if you could possible be pregnant.  Possible side effects:  Bleeding from needle site Infection (rare, may require surgery) Nerve injury (rare) Numbness & tingling (temporary) A brief convulsion or seizure Light-headedness (temporary) Pain at  injection site (several days) Decreased blood pressure (temporary) Weakness in the leg (temporary)   Call if you experience:  New onset weakness or numbness of an extremity below the injection site that last more than 8 hours. Hives or difficulty breathing ( go to the emergency room) Inflammation or drainage at the injection site Any new symptoms which are concerning to you  Please note:  Although the local anesthetic injected can often make your back/ hip/ buttock/ leg feel good for several hours after the injections, the pain will likely return.  It takes 3-7 days for steroids to work in the sacroiliac area.  You may not notice any pain relief for at least that one week.  If effective, we will often do a series of three injections spaced 3-6 weeks apart to maximally decrease your pain.  After the initial series, we generally will wait some months before a repeat injection of the same type.  If you have any questions, please call [phone] [facility]   

## 2021-03-13 NOTE — Progress Notes (Addendum)
Patient's Name: [name]  MRN: [id number]  Referring Provider: Gillis Santa, MD  DOB: [birth year]  PCP: Vidal Schwalbe, MD  DOS: 03/13/2021  Note by: Gillis Santa, MD  Service setting: Ambulatory outpatient  Specialty: Interventional Pain Management  Patient type: Established  Location: [facility] (AMB) Pain Management Facility  Visit type: Interventional Procedure   Primary Reason for Visit: Interventional Pain Management Treatment. CC: Back Pain (Lumbar bilateral )  Procedure:  Anesthesia, Analgesia, Anxiolysis:  Type: Therapeutic Sacroiliac Joint Steroid Injection #2 for 2022  Region: Inferior Lumbosacral Region Level: PIIS (Posterior Inferior Iliac Spine) Laterality: Bilateral  Type: Moderate (Conscious) Sedation combined with Local Anesthesia Indication(s): Analgesia and Anxiety Route: Intravenous (IV) IV Access: Secured Sedation: Meaningful verbal contact was maintained at all times during the procedure  Local Anesthetic: Lidocaine 1%   Indications: 1. Sacroiliac joint pain   2. Chronic pain syndrome    Pain Score: Pre-procedure: 6 /10 Post-procedure: 7 /10   Pre-op Assessment:  [name] is a 50 y.o. (year old), female patient, seen today for interventional treatment. She  has a past surgical history that includes Abdominal hysterectomy; Cholecystectomy; Hand surgery; Back surgery; Colonoscopy with esophagogastroduodenoscopy (egd) (07/2009); and Esophagogastroduodenoscopy (06/2007). [name] has a current medication list which includes the following prescription(s): budesonide, clonazepam, dicyclomine, famotidine, hydroxyzine, metoprolol succinate, ondansetron, oxycodone-acetaminophen, [START ON 03/30/2021] oxycodone-acetaminophen, [START ON 04/29/2021] oxycodone-acetaminophen, temazepam, and promethazine, and the following Facility-Administered Medications: fentanyl, methylprednisolone acetate, and midazolam. Her primarily concern today is the Back Pain (Lumbar bilateral  )  Initial Vital Signs:  Pulse/HCG Rate: 72ECG Heart Rate: 74 Temp: (!) 97.1 F (36.2 C) Resp: 16 BP: (!) 141/73 SpO2: 100 %  BMI: Estimated body mass index is 20.78 kg/m as calculated from the following:   Height as of this encounter: 5\' 1"  (1.549 m).   Weight as of this encounter: 110 lb (49.9 kg).  Risk Assessment: Allergies: Reviewed. She is allergic to buprenorphine, penicillins, aripiprazole, seroquel [quetiapine], cefaclor, doxycycline, gabapentin, ibuprofen, levofloxacin, lisinopril, macrobid [nitrofurantoin monohyd macro], and naproxen.  Allergy Precautions: None required Coagulopathies: Reviewed. None identified.  Blood-thinner therapy: None at this time Active Infection(s): Reviewed. None identified. [name] is afebrile  Site Confirmation: [name] was asked to confirm the procedure and laterality before marking the site Procedure checklist: Completed Consent: Before the procedure and under the influence of no sedative(s), amnesic(s), or anxiolytics, the patient was informed of the treatment options, risks and possible complications. To fulfill our ethical and legal obligations, as recommended by the American Medical Association's Code of Ethics, I have informed the patient of my clinical impression; the nature and purpose of the treatment or procedure; the risks, benefits, and possible complications of the intervention; the alternatives, including doing nothing; the risk(s) and benefit(s) of the alternative treatment(s) or procedure(s); and the risk(s) and benefit(s) of doing nothing. The patient was provided information about the general risks and possible complications associated with the procedure. These may include, but are not limited to: failure to achieve desired goals, infection, bleeding, organ or nerve damage, allergic reactions, paralysis, and death. In addition, the patient was informed of those risks and complications associated to the procedure, such as failure  to decrease pain; infection; bleeding; organ or nerve damage with subsequent damage to sensory, motor, and/or autonomic systems, resulting in permanent pain, numbness, and/or weakness of one or several areas of the body; allergic reactions; (i.e.: anaphylactic reaction); and/or death. Furthermore, the patient was informed of those risks and complications associated with the medications. These include,  but are not limited to: allergic reactions (i.e.: anaphylactic or anaphylactoid reaction(s)); adrenal axis suppression; blood sugar elevation that in diabetics may result in ketoacidosis or comma; water retention that in patients with history of congestive heart failure may result in shortness of breath, pulmonary edema, and decompensation with resultant heart failure; weight gain; swelling or edema; medication-induced neural toxicity; particulate matter embolism and blood vessel occlusion with resultant organ, and/or nervous system infarction; and/or aseptic necrosis of one or more joints. Finally, the patient was informed that Medicine is not an exact science; therefore, there is also the possibility of unforeseen or unpredictable risks and/or possible complications that may result in a catastrophic outcome. The patient indicated having understood very clearly. We have given the patient no guarantees and we have made no promises. Enough time was given to the patient to ask questions, all of which were answered to the patient's satisfaction. [name] has indicated that she wanted to continue with the procedure. Attestation: I, the ordering provider, attest that I have discussed with the patient the benefits, risks, side-effects, alternatives, likelihood of achieving goals, and potential problems during recovery for the procedure that I have provided informed consent. Date  Time: 03/13/2021  8:50 AM  Pre-Procedure Preparation:  Monitoring: As per clinic protocol. Respiration, ETCO2, SpO2, BP, heart rate and  rhythm monitor placed and checked for adequate function Safety Precautions: Patient was assessed for positional comfort and pressure points before starting the procedure. Time-out: I initiated and conducted the "Time-out" before starting the procedure, as per protocol. The patient was asked to participate by confirming the accuracy of the "Time Out" information. Verification of the correct person, site, and procedure were performed and confirmed by me, the nursing staff, and the patient. "Time-out" conducted as per Joint Commission's Universal Protocol (UP.01.01.01). Time: 0926  Description of Procedure:       Position: Prone Target Area: Inferior, posterior, aspect of the sacroiliac fissure Approach: Posterior, paraspinal, ipsilateral approach. Area Prepped: Entire Lower Lumbosacral Region Prepping solution: ChloraPrep (2% chlorhexidine gluconate and 70% isopropyl alcohol) Safety Precautions: Aspiration looking for blood return was conducted prior to all injections. At no point did we inject any substances, as a needle was being advanced. No attempts were made at seeking any paresthesias. Safe injection practices and needle disposal techniques used. Medications properly checked for expiration dates. SDV (single dose vial) medications used. Description of the Procedure: Protocol guidelines were followed. The patient was placed in position over the procedure table. The target area was identified and the area prepped in the usual manner. Skin & deeper tissues infiltrated with local anesthetic. Appropriate amount of time allowed to pass for local anesthetics to take effect. The procedure needle was advanced under fluoroscopic guidance into the sacroiliac joint until a firm endpoint was obtained. Proper needle placement secured. Negative aspiration confirmed. Solution injected in intermittent fashion, asking for systemic symptoms every 0.5cc of injectate. The needles were then removed and the area cleansed,  making sure to leave some of the prepping solution back to take advantage of its long term bactericidal properties. Vitals:   03/13/21 0945 03/13/21 0955 03/13/21 1005 03/13/21 1009  BP: 124/77 138/71 134/68 (!) 144/70  Pulse:      Resp: 18 18 16 18   Temp:      TempSrc:      SpO2: 98% 97% 100% 98%  Weight:      Height:         Start Time: 0926 hrs. End Time: 0939 hrs. Materials:  Needle(s)  Type: Regular needle Gauge: 22G Length: 3.5-in Medication(s): Please see orders for medications and dosing details. 10 cc solution made of 8 cc of 0.2% ropivacaine, 2 cc of  decadron 10mg /cc.  2.5 cc injected intra-articular, 2.5 cc injected periarticular on each side. Imaging Guidance (Non-Spinal):  Type of Imaging Technique: Fluoroscopy Guidance (Non-Spinal) Indication(s): Assistance in needle guidance and placement for procedures requiring needle placement in or near specific anatomical locations not easily accessible without such assistance. Exposure Time: Please see nurses notes. Contrast: Before injecting any contrast, we confirmed that the patient did not have an allergy to iodine, shellfish, or radiological contrast. Once satisfactory needle placement was completed at the desired level, radiological contrast was injected. Contrast injected under live fluoroscopy. No contrast complications. See chart for type and volume of contrast used. Fluoroscopic Guidance: I was personally present during the use of fluoroscopy. "Tunnel Vision Technique" used to obtain the best possible view of the target area. Parallax error corrected before commencing the procedure. "Direction-depth-direction" technique used to introduce the needle under continuous pulsed fluoroscopy. Once target was reached, antero-posterior, oblique, and lateral fluoroscopic projection used confirm needle placement in all planes. Images permanently stored in EMR. Interpretation: I personally interpreted the imaging intraoperatively.  Adequate needle placement confirmed in multiple planes. Appropriate spread of contrast into desired area was observed. No evidence of afferent or efferent intravascular uptake. Permanent images saved into the patient's record.   Post-operative Assessment:  Post-procedure Vital Signs:  Pulse/HCG Rate: 7272 Temp: (!) 97.1 F (36.2 C) Resp: 18 BP: (!) 144/70 SpO2: 98 %  EBL: None  Complications: No immediate post-treatment complications observed by team, or reported by patient.  Note: The patient tolerated the entire procedure well. A repeat set of vitals were taken after the procedure and the patient was kept under observation following institutional policy, for this type of procedure. Post-procedural neurological assessment was performed, showing return to baseline, prior to discharge. The patient was provided with post-procedure discharge instructions, including a section on how to identify potential problems. Should any problems arise concerning this procedure, the patient was given instructions to immediately contact us, at any time, without hesitation. In any case, we plan to contact the patient by telephone for a follow-up status report regarding this interventional procedure.  Comments:  No additional relevant information. 5 out of 5 strength bilateral lower extremity: Plantar flexion, dorsiflexion, knee flexion, knee extension.  Plan of Care   Imaging Orders         DG PAIN CLINIC C-ARM 1-60 MIN NO REPORT       Medications ordered for procedure: Meds ordered this encounter  Medications   iohexol (OMNIPAQUE) 180 MG/ML injection 10 mL    Must be Myelogram-compatible. If not available, you may substitute with a water-soluble, non-ionic, hypoallergenic, myelogram-compatible radiological contrast medium.   lidocaine (XYLOCAINE) 2 % (with pres) injection 400 mg   methylPREDNISolone acetate (DEPO-MEDROL) injection 80 mg   ropivacaine (PF) 2 mg/mL (0.2%) (NAROPIN) injection 9 mL    fentaNYL (SUBLIMAZE) injection 25-50 mcg    Make sure Narcan is available in the pyxis when using this medication. In the event of respiratory depression (RR< 8/min): Titrate NARCAN (naloxone) in increments of 0.1 to 0.2 mg IV at 2-3 minute intervals, until desired degree of reversal.   DISCONTD: midazolam (VERSED) bolus via infusion 1-2 mg    Make sure Flumazenil is available in the pyxis when using this medication. If oversedation occurs, administer 0.2 mg IV over 15 sec. If after 45 sec no  response, administer 0.2 mg again over 1 min; may repeat at 1 min intervals; not to exceed 4 doses (1 mg)   ondansetron (ZOFRAN) injection 4 mg   midazolam (VERSED) injection 1-2 mg    Make sure Flumazenil is available in the pyxis when using this medication. If oversedation occurs, administer 0.2 mg IV over 15 sec. If after 45 sec no response, administer 0.2 mg again over 1 min; may repeat at 1 min intervals; not to exceed 4 doses (1 mg)     Medications administered: We administered iohexol, lidocaine, ropivacaine (PF) 2 mg/mL (0.2%), fentaNYL, and ondansetron.  See the medical record for exact dosing, route, and time of administration.  New Prescriptions   No medications on file   Disposition: Discharge home  Discharge Date & Time: 03/13/2021; 1010 hrs.   Physician-requested Follow-up: Return for Keep sch. appt.  Future Appointments  Date Time Provider [place]  04/11/2021  9:30 AM Westly Pam [facility]  06/01/2021 12:40 PM Gillis Santa, MD [facility] [place] None   Primary Care Physician: Vidal Schwalbe, MD Location: [facility] Note by: Gillis Santa, MD Date: 03/13/2021; Time: 11:39 AM  Disclaimer:  Medicine is not an exact science. The only guarantee in medicine is that nothing is guaranteed. It is important to note that the decision to proceed with this intervention was based on the information collected from the patient. The Data and conclusions  were drawn from the patient's questionnaire, the interview, and the physical examination. Because the information was provided in large part by the patient, it cannot be guaranteed that it has not been purposely or unconsciously manipulated. Every effort has been made to obtain as much relevant data as possible for this evaluation. It is important to note that the conclusions that lead to this procedure are derived in large part from the available data. Always take into account that the treatment will also be dependent on availability of resources and existing treatment guidelines, considered by other Pain Management Practitioners as being common knowledge and practice, at the time of the intervention. For Medico-Legal purposes, it is also important to point out that variation in procedural techniques and pharmacological choices are the acceptable norm. The indications, contraindications, technique, and results of the above procedure should only be interpreted and judged by a Board-Certified Interventional Pain Specialist with extensive familiarity and expertise in the same exact procedure and technique.

## 2021-03-13 NOTE — Progress Notes (Signed)
Safety precautions to be maintained throughout the outpatient stay will include: orient to surroundings, keep bed in low position, maintain call bell within reach at all times, provide assistance with transfer out of bed and ambulation.  

## 2021-03-14 ENCOUNTER — Telehealth: Payer: Self-pay

## 2021-03-14 ENCOUNTER — Other Ambulatory Visit: Payer: Self-pay | Admitting: Gastroenterology

## 2021-03-14 NOTE — Telephone Encounter (Signed)
Post procedure phone call.  Patient states she is sore, but doing good.

## 2021-04-10 NOTE — Progress Notes (Signed)
Primary Care Physician: Vidal Schwalbe, MD  Primary Gastroenterologist:  Elon Alas. Abbey Chatters, DO   Chief Complaint  Patient presents with   hospital f/u    Tapered self off Entocort-finished last week   Nausea    With vomiting    HPI: [name] is a 50 y.o. female here for follow-up.  She is initially seen in April 2022 for GI consultation in the ED.  History of colitis previously followed by [facility] (but not seen since 2014).  States at one point she was treated with prednisone but never on any long-term oral medication.  She believes she has a history of UC.  She had been managing her symptoms over the years with Phenergan and Librax.  Reporting multiple EGDs and colonoscopies.  She came to the ED in April due to severe, crampy right lower quadrant pain with radiation across the lower abdomen associated with vomiting and diarrhea.  At that time she reported being on the schedule for appendectomy the following week for history of enlarging appendix.  In the ED she had a CT with contrast showing wall thickening involving much of the colon with skip type lesions noted in the ascending, transverse, descending colon with more uniform wall thickening throughout the sigmoid colon and upper rectum.  She had wall thickening noted at the terminal ileum-cecum junction.  Concern for possible underlying Crohn's disease.  She also had wall thickening in portions of the distal stomach.  Appendix slightly smaller compared to recent study.  Biliary duct dilation without obstructing lesion.  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  Recommended repeat imaging in 6 to 12 months via pre and postcontrast abdominal MRI.  Patient was offered admission for management of her acute symptoms and to expedite her care but she declined admission.  She was started on prednisone with taper, advised stool studies.  We schedule her for an  outpatient colonoscopy and upper endoscopy in May.  She presented for procedures on May 2 and reported that she took the entire MiraLAX prep and has yet to have a bowel movement.  She also ate soup the morning of the procedure.  Both procedures had to be canceled.  Patient seen in the ED on July 2022, CT abdomen pelvis with contrast at that time showed appendix unchanged with maximum diameter 6 mm, no bowel wall thickening, stomach unremarkable.  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  Recommending MRI in 1 year.  Stomach and bowel unremarkable except for sigmoid diverticulosis.  Labs from July 2022: LFTs normal, creatinine 0.75, white blood cell count 13,400, hemoglobin 14.1, platelets 426,000, lipase 25.  Urine drug screen positive for opiates and benzodiazepines.  Today: Patient states that she is out of Entocort, tapered herself off.  This was prescribed by her PCP initially back in August.  Initially given pantoprazole to take once daily, no refills, also prescribed by PCP.  She is taking Zofran on a regular basis.  She takes 75 Percocets per month. States was admitted twice in [place] since we saw her and treated for colitis on CT. Never had appendix removed, she put it on hold. She takes bentyl 3 times daily but feels like she previously did better on 20mg  dosing. She requests  phenergan suppositories to use for intractable vomiting to try and avoid ED visit. Complains of some burning in upper stomach. Takes care of herself, Pepcid. Nausea is her most worrisome symptoms. Has it every day. Vomits frequently. BM every 2-3 days. Often hard to go. No melena, brbpr.    Colonoscopy/EGD 07/21/2009 at [facility]: Actual reports cannot be seen however pathology results showed terminal ileum and random colon biopsies were normal. Stomach biopsy  with gastric antral and fundic mucosa with reactive foveolar hyperplasia.  No active gastritis.  Immunohistological stain negative for H. Pylori.   EGD February 2009: Normal esophagus, gastric mucosal abnormality with erythema.  Biopsy showed gastric antral and fundic mucosa with reactive foveolar hyperplasia, no gastritis, H. pylori negative.   Current Outpatient Medications  Medication Sig Dispense Refill   clonazePAM (KLONOPIN) 0.5 MG tablet Take 0.5 mg by mouth daily as needed for anxiety.      dicyclomine (BENTYL) 20 MG tablet Take 1 tablet (20 mg total) by mouth 3 (three) times daily before meals. Hold for constipation 90 tablet 3   famotidine (PEPCID) 20 MG tablet Take 1 tablet (20 mg total) by mouth 2 (two) times daily. 60 tablet 1   hydrOXYzine (ATARAX/VISTARIL) 50 MG tablet Take by mouth as needed.     linaclotide (LINZESS) 145 MCG CAPS capsule Take 1 capsule (145 mcg total) by mouth daily before breakfast. 30 capsule 5   metoprolol succinate (TOPROL-XL) 100 MG 24 hr tablet Take 100 mg by mouth daily. Take with or immediately following a meal.     oxyCODONE-acetaminophen (PERCOCET/ROXICET) 5-325 MG tablet Take 1 tablet by mouth every 8 (eight) hours as needed for severe pain. 75 tablet 0   [START ON 04/29/2021] oxyCODONE-acetaminophen (PERCOCET/ROXICET) 5-325 MG tablet Take 1 tablet by mouth every 8 (eight) hours as needed for severe pain. 75 tablet 0   pantoprazole (PROTONIX) 40 MG tablet Take 40 mg by mouth daily.     promethazine (PHENERGAN) 12.5 MG suppository Place 1 suppository (12.5 mg total) rectally every 6 (six) hours as needed for nausea or vomiting. Do not take with hydroxyzine, Klonopin, Zofran. 12 each 0   temazepam (RESTORIL) 30 MG capsule Take 30 mg by mouth at bedtime as needed for sleep.     ondansetron (ZOFRAN-ODT) 4 MG disintegrating tablet DISSOLVE 1 OR 2 TABLETS ON THE TONGUE EVERY 8 HOURS AS NEEDED 60 tablet 1   No current facility-administered medications for  this visit.    Allergies as of 04/11/2021 - Review Complete 04/11/2021  Allergen Reaction Noted   Buprenorphine Other (See Comments) 01/24/2016   Penicillins Swelling and Rash 07/08/2011   Aripiprazole Other (See Comments) 08/01/2020   Seroquel [quetiapine] Other (See Comments) 08/18/2020   Cefaclor Other (See Comments)    Doxycycline Rash 08/01/2020   Gabapentin Nausea And Vomiting 01/24/2016   Ibuprofen Swelling and Rash 07/08/2011   Levofloxacin Rash 01/24/2016   Lisinopril Nausea And Vomiting 08/01/2020   Macrobid [nitrofurantoin monohyd macro] Rash 11/30/2014   Naproxen Swelling and Rash 07/08/2011   Past Medical History:  Diagnosis Date   Chronic back pain    Colitis    Hypertension    Migraine    Plumbism    blood clot    Past Surgical History:  Procedure Laterality Date   ABDOMINAL HYSTERECTOMY     BACK SURGERY     CHOLECYSTECTOMY     Complicated by bile leak requiring ERCP with temporary stenting   COLONOSCOPY WITH ESOPHAGOGASTRODUODENOSCOPY (EGD)  07/2009   [facility]:  Op notes cannot be seen through care everywhere however pathology showed terminal ileum and random colon biopsies normal.  Stomach biopsy with gastric antral and fundic mucosa with reactive foveolar hyperplasia.  No active gastritis.  Stains negative for H. pylori.   ESOPHAGOGASTRODUODENOSCOPY  06/2007   [facility]: Normal esophagus, gastric mucosal abnormality with erythema, biopsy showed gastric antral and fundic mucosa with reactive foveolar hyperplasia, no gastritis, no H. pylori.   HAND SURGERY     Family History  Problem Relation Age of Onset   Cancer Other    Seizures Other    Stroke Other    Diabetes Other    Social History   Tobacco Use   Smoking status: Every Day    Packs/day: 1.00    Years: 20.00    Pack years: 20.00    Types: Cigarettes   Smokeless tobacco: Never  Vaping Use   Vaping Use: Never used  Substance Use Topics   Alcohol use: No   Drug use: No     ROS:  General:  Negative for anorexia, weight loss, fever, chills, fatigue, weakness. ENT: Negative for hoarseness, difficulty swallowing , nasal congestion. CV: Negative for chest pain, angina, palpitations, dyspnea on exertion, peripheral edema.  Respiratory: Negative for dyspnea at rest, dyspnea on exertion, cough, sputum, wheezing.  GI: See history of present illness. GU:  Negative for dysuria, hematuria, urinary incontinence, urinary frequency, nocturnal urination.  Endo: Negative for unusual weight change.    Physical Examination:   BP 126/71   Pulse 74   Temp 97.7 F (36.5 C) (Temporal)   Ht 5\' 1"  (1.549 m)   Wt 116 lb 9.6 oz (52.9 kg)   BMI 22.03 kg/m   General: Well-nourished, well-developed in no acute distress.  Eyes: No icterus. Mouth: masked Lungs: Clear to auscultation bilaterally.  Heart: Regular rate and rhythm, no murmurs rubs or gallops.  Abdomen: Bowel sounds are normal,   nondistended, no hepatosplenomegaly or masses, no abdominal bruits or hernia , no rebound or guarding.  Gastric tenderness Extremities: No lower extremity edema. No clubbing or deformities. Neuro: Alert and oriented x 4   Skin: Warm and dry, no jaundice.   Psych: Alert and cooperative, normal mood and affect.  Labs:  See hpi  Imaging Studies: DG PAIN CLINIC C-ARM 1-60 MIN NO REPORT  Result Date: 03/13/2021 Fluoro was used, but no Radiologist interpretation will be provided. Please refer to "NOTES" tab for provider progress note.    Assessment:  Colitis: 50 year old female who reports long history of colitis, previously seen by [facility], last seen in 2014.  Has been treated with prednisone in the past but denies chronic maintenance medication.  Prior colonoscopy in 2011, report not available but pathology showed normal terminal ileum and random colon biopsies.  We initially saw the patient in April 2022 during ED consultation, patient declined admission at the time.  CT showed skip type lesions in the  ascending, transverse, descending colon with more uniform wall thickening throughout the sigmoid and upper rectum, wall thickening at the terminal ileum-cecum junction.  Findings concerning for Crohn's.  EGD/colonoscopy canceled earlier this year because of poor bowel prep inpatient consumed clear liquids morning of her procedure. She reports at least 2 admissions in [place] for refractory symptoms. We have requested records.   Nausea/vomiting: patient has chronic nausea with frequent vomiting, epigastric tenderness noted on exam. She does have a history of aspirin powder use.  Recommend upper endoscopy for further evaluation, to exclude gastritis or ulcers.  Cannot rule out drug-induced gastroparesis.  GERD: Typical heartburn on pantoprazole and Pepcid.  Complains of burning in the stomach.  Evaluate at time of endoscopy.  Constipation: Having mostly constipation at this point after complaining 3 months of Entocort.  Given history of poor bowel prep, will add Linzess daily, may skip a day if she develops frequent stooling.   Plan:  EGD/colonoscopy with Abbey Chatters.  ASA 2.  Plan for 2-day bowel prep.  We will have her hold dicyclomine for 5 days prior to her procedure.  2 full days of clear liquids.  Typically does not tolerate Trilyte due to vomiting.  I have discussed the risks, alternatives, benefits with regards to but not limited to the risk of reaction to medication, bleeding, infection, perforation and the patient is agreeable to proceed. Written consent to be obtained. Start Linzess 145 mcg daily for constipation.  She will let us know if not effective. Continue Zofran as needed for nausea and vomiting. Limited supply Phenergan suppositories, 12.5 mg provided.  Advised not to take with hydroxyzine, Klonopin, Zofran. Increase dicyclomine to 20 mg up to 3 times daily for abdominal cramping.  Hold for worsening constipation. Request records from [facility], prior admissions this year.

## 2021-04-11 ENCOUNTER — Telehealth: Payer: Self-pay | Admitting: *Deleted

## 2021-04-11 ENCOUNTER — Encounter: Payer: Self-pay | Admitting: *Deleted

## 2021-04-11 ENCOUNTER — Ambulatory Visit (INDEPENDENT_AMBULATORY_CARE_PROVIDER_SITE_OTHER): Payer: BC Managed Care – PPO | Admitting: Gastroenterology

## 2021-04-11 ENCOUNTER — Encounter: Payer: Self-pay | Admitting: Gastroenterology

## 2021-04-11 ENCOUNTER — Other Ambulatory Visit: Payer: Self-pay

## 2021-04-11 VITALS — BP 126/71 | HR 74 | Temp 97.7°F | Ht 61.0 in | Wt 116.6 lb

## 2021-04-11 DIAGNOSIS — K529 Noninfective gastroenteritis and colitis, unspecified: Secondary | ICD-10-CM

## 2021-04-11 DIAGNOSIS — R112 Nausea with vomiting, unspecified: Secondary | ICD-10-CM

## 2021-04-11 DIAGNOSIS — K219 Gastro-esophageal reflux disease without esophagitis: Secondary | ICD-10-CM | POA: Diagnosis not present

## 2021-04-11 DIAGNOSIS — R1013 Epigastric pain: Secondary | ICD-10-CM | POA: Diagnosis not present

## 2021-04-11 DIAGNOSIS — K5900 Constipation, unspecified: Secondary | ICD-10-CM | POA: Insufficient documentation

## 2021-04-11 DIAGNOSIS — K5903 Drug induced constipation: Secondary | ICD-10-CM

## 2021-04-11 DIAGNOSIS — G8929 Other chronic pain: Secondary | ICD-10-CM | POA: Insufficient documentation

## 2021-04-11 MED ORDER — DICYCLOMINE HCL 20 MG PO TABS: 20.0000 mg | ORAL_TABLET | Freq: Three times a day (TID) | ORAL | 3 refills | Status: DC

## 2021-04-11 MED ORDER — LINACLOTIDE 145 MCG PO CAPS: 145.0000 ug | ORAL_CAPSULE | Freq: Every day | ORAL | 5 refills | Status: DC

## 2021-04-11 MED ORDER — NA SULFATE-K SULFATE-MG SULF 17.5-3.13-1.6 GM/177ML PO SOLN: 1.0000 | Freq: Once | ORAL | 0 refills | Status: AC

## 2021-04-11 MED ORDER — ONDANSETRON 4 MG PO TBDP: ORAL_TABLET | ORAL | 1 refills | Status: DC

## 2021-04-11 MED ORDER — PROMETHAZINE HCL 12.5 MG RE SUPP: 12.5000 mg | Freq: Four times a day (QID) | RECTAL | 0 refills | Status: DC | PRN

## 2021-04-11 NOTE — Patient Instructions (Addendum)
Do not take promethazine with hydroxyzine, Klonopin, Zofran. Use suppositories only if Zofran not working. RX sent to pharmacy. Continue zofran as needed for nausea/vomiting. I have sent in bentyl at new dose of 20mg . If constipation worsens, we may need to decrease dose. You can hold for a day or two if you get constipated. Do not take more than three times per day.  Start Linzess 184mcg daily for constipation. You can hold if having frequent loose stools. However, the week before your colonoscopy, you need to take daily to help clean your bowel prep. You will also take addition prep. See other instructions provided for your procedures. SAMPLES PROVIDED, PLEASE SAVE TWO BOTTLES FOR THE WEEK BEFORE YOUR COLONOSCOPY IN CASE WE HAVE TROUBLE GETTING APPROVED BY INSURANCE. I AM ALSO SEND IN RX.

## 2021-04-11 NOTE — Telephone Encounter (Signed)
Pt returned call. She has 1 prep already at home she picked up today. Advised the pharmacy will try tomorrow to run the 2nd prep through and will let me know if it does not. She is aware she needs 2 prep kits for procedure.

## 2021-04-11 NOTE — Telephone Encounter (Signed)
Received PA for suprep from pharmacy. [place] and spoke with pharmacy. Prep requires PA as it is requested for 2 kits. Pharmacy was able to run through 1 kit. They will try to run 2nd kit through tomorrow and let us know if it won't go through.  Tried to call pt, no answer and no VM to make aware.

## 2021-04-12 NOTE — Telephone Encounter (Signed)
The [employer] phoned back to say pt's second bottle of prep will not go through because her insurance only pays for one kit every 30 days. They are wanting to know if you want a PA for second bottle or just call them back and do something different.

## 2021-04-12 NOTE — Telephone Encounter (Signed)
Fowarding to [facility] to get further recs as insurance won't cover 2nd prep kit. We don't have any suprep samples either. Patient states she can tolerate the gallon.

## 2021-04-12 NOTE — Telephone Encounter (Signed)
Have patient hold bentyl (dicyclomine) starting 5 days before her procedure.  She needs to take Linzess 182mcg daily, she was given RX so she can take now and we gave her samples to use before procedure in case she could not afford the RX. If she does not have regular BMs on this, she should call and we will increase the dose.  Two full days of clear liquids before tcs. Two days before the colonoscopy, have her do miralax prep (but give only the bisacodyl 10mg  before the miralax, then give one cap miralax hourly for 4 doses.  One day before colonoscopy, give her the suprep prep (both doses).

## 2021-04-13 ENCOUNTER — Encounter: Payer: Self-pay | Admitting: Gastroenterology

## 2021-04-13 NOTE — Telephone Encounter (Signed)
Called pt to discuss new instructions and informed her that 2nd suprep was not covered. She stated "I am not feeling well just send me new instructions and I will read them when I get them". New instructions mailed.

## 2021-04-25 ENCOUNTER — Telehealth: Payer: Self-pay | Admitting: *Deleted

## 2021-04-25 NOTE — Telephone Encounter (Signed)
PA approved via [employer] for colonoscopy. Auth# [id number] DOS 04/25/2021-06/23/2021  PA approved via [employer] for EGD. Auth# [id number], DOS 04/25/2021-06/23/2021

## 2021-05-01 ENCOUNTER — Encounter (HOSPITAL_COMMUNITY): Payer: Self-pay | Admitting: Anesthesiology

## 2021-05-01 ENCOUNTER — Other Ambulatory Visit: Payer: Self-pay | Admitting: Gastroenterology

## 2021-05-02 ENCOUNTER — Other Ambulatory Visit: Payer: Self-pay

## 2021-05-02 ENCOUNTER — Telehealth: Payer: Self-pay | Admitting: Internal Medicine

## 2021-05-02 NOTE — Telephone Encounter (Signed)
Pt was scheduled for TCS/EGD today.   Called pt, TCS prep made her sick and caused vomiting. She was given special instructions to take Dulcolax and Miralax prep the day before doing Suprep all day before TCS. She done ok with the Miralax prep but Suprep caused vomiting. She only had 2 Zofran available and she didn't take any Zofran prior to drinking Suprep. She is unable to tolerate Trilyte. TCS/EGD rescheduled to 05/23/21 at 2:00pm. Endo scheduler informed. Refill request has been entered for Zofran and sent to refill pool.  Monica Paganini, do you want her to try Clenpiq or any other suggestions for prep?

## 2021-05-02 NOTE — Telephone Encounter (Signed)
Patient sick and needs to reschedule her procedure

## 2021-05-03 MED ORDER — ONDANSETRON 4 MG PO TBDP: ORAL_TABLET | ORAL | 1 refills | Status: DC

## 2021-05-12 NOTE — Telephone Encounter (Signed)
[name], please advise suggestions for prep. She is scheduled for TCS/EGD 05/23/21.

## 2021-05-15 ENCOUNTER — Other Ambulatory Visit: Payer: Self-pay | Admitting: Gastroenterology

## 2021-05-16 ENCOUNTER — Telehealth: Payer: Self-pay | Admitting: Internal Medicine

## 2021-05-16 NOTE — Telephone Encounter (Signed)
Pt asked if we could call in Zofran to [facility]. [phone]

## 2021-05-16 NOTE — Telephone Encounter (Signed)
Pt is requesting a refill. Pt was last seen on 04/11/21 by Neil Crouch.

## 2021-05-17 MED ORDER — CLENPIQ 10-3.5-12 MG-GM -GM/160ML PO SOLN: ORAL | 0 refills | Status: DC

## 2021-05-17 NOTE — Telephone Encounter (Signed)
Refill completed.

## 2021-05-17 NOTE — Telephone Encounter (Signed)
She will need 2 day bowel prep as outline previously (see 04/13/21 letter). Also 2 full days of clear liquids.   All instructions should be the same, except instead of Suprep we will do Clenpiq (Clenpiq should be done all the day before her procedure).    RX for Zofran provided to allow her to take the days of her bowel prep. She can have Linzess 162mcg samples if needed. She should be taking one daily at this time.  Make sure she holds ASA powders and bentyl starting now. Hold bentyl until after procedure.  Please give sample of Clenpiq.

## 2021-05-17 NOTE — Addendum Note (Signed)
Addended by: Mahala Menghini on: 05/17/2021 05:04 PM   Modules accepted: Orders

## 2021-05-17 NOTE — Telephone Encounter (Signed)
Pt states that she had to take extra due to her colonoscopy prep making her sick and that she is rescheduled for the 10th and is wanting to make sure she has zofran for when she has to prep again.

## 2021-05-18 NOTE — Telephone Encounter (Signed)
No ans, vm not set up.

## 2021-05-18 NOTE — Telephone Encounter (Signed)
Spoke to pt, she is aware Neil Crouch PA advised for her to do Clenpiq prep. Informed her to start holding ASA powders and bentyl starting now and to hold bentyl until after procedure. She is aware she will do prep like before but do Clenpiq instead of Suprep. She will come by office today or tomorrow to pick up Clenpiq sample and instructions (told her times the door will be locked). Linzess 173mcg (8 capsules) samples placed at front desk with Clenpiq and procedure instructions.

## 2021-05-18 NOTE — Telephone Encounter (Signed)
Pt was made aware and verbalized understanding.  

## 2021-05-18 NOTE — Telephone Encounter (Signed)
Tried to call pt, no voicemail set-up.

## 2021-05-23 ENCOUNTER — Encounter (HOSPITAL_COMMUNITY): Admission: RE | Payer: Self-pay | Source: Home / Self Care

## 2021-05-23 ENCOUNTER — Telehealth: Payer: Self-pay | Admitting: Internal Medicine

## 2021-05-23 ENCOUNTER — Telehealth: Payer: Self-pay | Admitting: *Deleted

## 2021-05-23 ENCOUNTER — Ambulatory Visit (HOSPITAL_COMMUNITY): Admission: RE | Admit: 2021-05-23 | Payer: BC Managed Care – PPO | Source: Home / Self Care

## 2021-05-23 SURGERY — COLONOSCOPY WITH PROPOFOL
Anesthesia: Monitor Anesthesia Care

## 2021-05-23 NOTE — Telephone Encounter (Signed)
Patient called in stating to cancel her procedure for today bc she is not cleaned out. She states her stool is liquid brown. I advised pt to still keep her procedure on as she is scheduled for also an EGD but she did not want too.  She states she just needs a different prep to clean her out.  She states she started the linzess 166mcg daily, did 2 days of clear liquids, did the dulcolax/miralax as directed along with the clenpiq. See previous notes patient stated she could not tolerate trylite, suprep.   Endo aware.   Magda Paganini, please advise thanks

## 2021-05-23 NOTE — Telephone Encounter (Signed)
Patient declined Trylite before reporting N/V. We tried two day bowel prep with miralax and Suprep but she vomited Suprep. She had two day bowel prep with miralax and clenpiq, as well as Linzess, but reports did not get cleaned out.    Will request advise from Dr. Abbey Chatters, wonder if we should give Miralax prep three days before procedure and then two days before and day before give Clenpiq (2 preps - we would have to provide samples).   Or a modified prep with osmoprep?

## 2021-05-23 NOTE — Telephone Encounter (Signed)
Osmoprep is not covered by her insurance. FYI Dr. Abbey Chatters

## 2021-05-23 NOTE — Telephone Encounter (Signed)
Patient called and said that she will need to reschedule her procedure because she is not "running clear" and is still running to the bathroom

## 2021-05-25 NOTE — Telephone Encounter (Signed)
Miralax prep three days before procedure and then two days before and day before give Clenpiq (2 preps - we would have to provide samples) sounds like a good plan.  Thank you

## 2021-05-26 NOTE — Telephone Encounter (Signed)
We will reach out to see if we can get samples prior to rescheduling patient.

## 2021-05-30 NOTE — Telephone Encounter (Signed)
Would also recommend holding bentyl five days before procedure.  Continue linzess 127mcg daily. Two full days of clear liquids.  PATIENT HAS TO CALL THE DAY BEFORE HER PROCEDURE IF SHE FEELS THAT SHE IS NOT CLEARING OUT WELL

## 2021-05-30 NOTE — Telephone Encounter (Signed)
noted 

## 2021-06-01 ENCOUNTER — Other Ambulatory Visit: Payer: Self-pay

## 2021-06-01 ENCOUNTER — Encounter: Payer: Self-pay | Admitting: Student in an Organized Health Care Education/Training Program

## 2021-06-01 ENCOUNTER — Ambulatory Visit
Payer: BC Managed Care – PPO | Attending: Student in an Organized Health Care Education/Training Program | Admitting: Student in an Organized Health Care Education/Training Program

## 2021-06-01 VITALS — BP 149/71 | HR 73 | Temp 97.1°F | Resp 16 | Ht 61.0 in | Wt 113.0 lb

## 2021-06-01 DIAGNOSIS — Z79891 Long term (current) use of opiate analgesic: Secondary | ICD-10-CM | POA: Diagnosis not present

## 2021-06-01 DIAGNOSIS — M47816 Spondylosis without myelopathy or radiculopathy, lumbar region: Secondary | ICD-10-CM

## 2021-06-01 DIAGNOSIS — M47818 Spondylosis without myelopathy or radiculopathy, sacral and sacrococcygeal region: Secondary | ICD-10-CM | POA: Diagnosis not present

## 2021-06-01 DIAGNOSIS — G5703 Lesion of sciatic nerve, bilateral lower limbs: Secondary | ICD-10-CM | POA: Diagnosis not present

## 2021-06-01 DIAGNOSIS — M533 Sacrococcygeal disorders, not elsewhere classified: Secondary | ICD-10-CM | POA: Diagnosis not present

## 2021-06-01 DIAGNOSIS — M461 Sacroiliitis, not elsewhere classified: Secondary | ICD-10-CM

## 2021-06-01 DIAGNOSIS — M7918 Myalgia, other site: Secondary | ICD-10-CM | POA: Diagnosis present

## 2021-06-01 DIAGNOSIS — G894 Chronic pain syndrome: Secondary | ICD-10-CM | POA: Diagnosis present

## 2021-06-01 MED ORDER — OXYCODONE-ACETAMINOPHEN 5-325 MG PO TABS: 1.5000 | ORAL_TABLET | Freq: Two times a day (BID) | ORAL | 0 refills | Status: AC | PRN

## 2021-06-01 MED ORDER — OXYCODONE-ACETAMINOPHEN 5-325 MG PO TABS: 1.5000 | ORAL_TABLET | Freq: Two times a day (BID) | ORAL | 0 refills | Status: DC | PRN

## 2021-06-01 NOTE — Progress Notes (Signed)
Nursing Pain Medication Assessment:  Safety precautions to be maintained throughout the outpatient stay will include: orient to surroundings, keep bed in low position, maintain call bell within reach at all times, provide assistance with transfer out of bed and ambulation.  Medication Inspection Compliance: Pill count conducted under aseptic conditions, in front of the patient. Neither the pills nor the bottle was removed from the patient's sight at any time. Once count was completed pills were immediately returned to the patient in their original bottle.  Medication: Oxycodone/APAP Pill/Patch Count:  0 of 75 pills remain Pill/Patch Appearance:  no tablets left Bottle Appearance: Standard pharmacy container. Clearly labeled. Filled Date: 49 / 17 / 2022 Last Medication intake:  Today

## 2021-06-01 NOTE — Progress Notes (Signed)
PROVIDER NOTE: Information contained herein reflects review and annotations entered in association with encounter. Interpretation of such information and data should be left to medically-trained personnel. Information provided to patient can be located elsewhere in the medical record under "Patient Instructions". Document created using STT-dictation technology, any transcriptional errors that may result from process are unintentional.    Patient: [name]  Service Category: E/M  Provider: Gillis Santa, MD  DOB: July 30, 1970  DOS: 06/01/2021  Specialty: Interventional Pain Management  MRN: [id number]  Setting: Ambulatory outpatient  PCP: Vidal Schwalbe, MD  Type: Established Patient    Referring Provider: Vidal Schwalbe, MD  Location: Office  Delivery: Face-to-face     HPI  [name], a 51 y.o. year old female, is here today because of her Sacroiliac joint pain [M53.3]. [name] primary complain today is Back Pain (Lumbar left is worse )  Last encounter: My last encounter with her was on 03/13/21  Pertinent problems: [name] has Lumbar radiculopathy; Lumbar degenerative disc disease; Lumbar spondylosis; Chronic low back pain with bilateral sciatica; Chronic pain syndrome; Lumbar sprain; Chronic migraine without aura without status migrainosus, not intractable; SI joint arthritis; and Long term prescription opiate use on their pertinent problem list. Pain Assessment: Severity of Chronic pain is reported as a 8 /10. Location: Back Lower, Left, Right/into both legs, all the way down on the left and approx 1/2 way on the right. Onset: More than a month ago. Quality: Discomfort, Constant, Sharp, Shooting (electrical current.). Timing: Constant. Modifying factor(s): injections, medications. Vitals:  height is _0  (1.549 m) and weight is 113 lb (51.3 kg). Her temporal temperature is 97.1 F (36.2 C) (abnormal). Her blood pressure is 149/71 (abnormal) and her pulse is 73. Her respiration is  16 and oxygen saturation is 95%.   Reason for encounter: medication management.  And worsening bilateral SI joint pain.  Patient presents today for medication management.    Of note, patient states that she received 85% pain relief in her bilateral SI joint, buttock pain for 3.5 months after her SI joint injection that was performed on 03/13/21.  She is now endorsing return of buttock and SI joint pain.  She is requesting a repeat bilateral SI joint injection. Risks and benefits reviewed and patient would like to proceed.   Pharmacotherapy Assessment  Analgesic:  04/29/2021  02/28/2021   2  Oxycodone-Acetaminophen 5-325 75.00  25  Un Pha  [id number]   Nor (6917)  0/0  22.50 MME  Comm Ins       Monitoring:  PMP: PDMP reviewed during this encounter.       Pharmacotherapy: No side-effects or adverse reactions reported. Compliance: No problems identified. Effectiveness: Clinically acceptable.  UDS:  Summary  Date Value Ref Range Status  08/24/2020 Note  Final    Comment:    ==================================================================== [employer] [facility] 13 (MW) ==================================================================== Test                             Result       Flag       Units  Drug Present and Declared for Prescription Verification   Oxazepam                       53           EXPECTED   ng/mg creat   Temazepam  114          EXPECTED   ng/mg creat    Oxazepam and temazepam are expected metabolites of diazepam.    Oxazepam is also an expected metabolite of other benzodiazepine    drugs, including chlordiazepoxide, prazepam, clorazepate, halazepam,    and temazepam.  Oxazepam and temazepam are available as scheduled    prescription medications.    7-aminoclonazepam              318          EXPECTED   ng/mg creat    7-aminoclonazepam is an expected metabolite of clonazepam. Source of    clonazepam is a scheduled prescription medication.     Oxycodone                      7108         EXPECTED   ng/mg creat   Oxymorphone                    1567         EXPECTED   ng/mg creat   Noroxycodone                   3353         EXPECTED   ng/mg creat   Noroxymorphone                 227          EXPECTED   ng/mg creat    Sources of oxycodone are scheduled prescription medications.    Oxymorphone, noroxycodone, and noroxymorphone are expected    metabolites of oxycodone. Oxymorphone is also available as a    scheduled prescription medication.  ==================================================================== Test                      Result    Flag   Units      Ref Range   Creatinine              49               mg/dL      >=20 ==================================================================== Declared Medications:  The flagging and interpretation on this report are based on the  following declared medications.  Unexpected results may arise from  inaccuracies in the declared medications.   **Note: The testing scope of this panel includes these medications:   Clonazepam (Klonopin)  Oxycodone (Percocet)  Temazepam (Restoril)   **Note: The testing scope of this panel does not include the  following reported medications:   Acetaminophen (Percocet)  Fluticasone (Flonase)  Metoprolol (Toprol)  Ondansetron (Zofran)  Prednisone (Deltasone) ==================================================================== For clinical consultation, please call [phone]. ====================================================================       ROS  Constitutional: Denies any fever or chills Gastrointestinal: No reported hemesis, hematochezia, vomiting, or acute GI distress Musculoskeletal:  Bilateral SI joint, buttock pain Neurological: No reported episodes of acute onset apraxia, aphasia, dysarthria, agnosia, amnesia, paralysis, loss of coordination, or loss of consciousness  Medication Review  Aspirin-Caffeine, clonazePAM,  dicyclomine, famotidine, hydrOXYzine, linaclotide, metoprolol succinate, ondansetron, oxyCODONE-acetaminophen, pantoprazole, and temazepam  History Review  Allergy: [name] is allergic to buprenorphine, penicillins, aripiprazole, seroquel [quetiapine], cefaclor, doxycycline, gabapentin, ibuprofen, levofloxacin, lisinopril, macrobid [nitrofurantoin monohyd macro], and naproxen. Drug: Ms. Tat  reports no history of drug use. Alcohol:  reports no history of alcohol use. Tobacco:  reports that she has been smoking cigarettes. She has a 20.00 pack-year  smoking history. She has never used smokeless tobacco. Social: Ms. Millikin  reports that she has been smoking cigarettes. She has a 20.00 pack-year smoking history. She has never used smokeless tobacco. She reports that she does not drink alcohol and does not use drugs. Medical:  has a past medical history of Chronic back pain, Colitis, Hypertension, Migraine, Plumbism, Renal mass, and Uterine cancer ([place]). Surgical: Ms. Scearce  has a past surgical history that includes Abdominal hysterectomy; Cholecystectomy; Hand surgery; Back surgery; Colonoscopy with esophagogastroduodenoscopy (egd) (07/2009); and Esophagogastroduodenoscopy (06/2007). Family: family history includes Cancer in an other family member; Diabetes in an other family member; Seizures in an other family member; Stroke in an other family member.  Laboratory Chemistry Profile   Renal Lab Results  Component Value Date   BUN 11 11/14/2020   CREATININE 0.75 11/14/2020   GFRAA >60 01/17/2020   GFRNONAA >60 11/14/2020     Hepatic Lab Results  Component Value Date   AST 29 11/14/2020   ALT 25 11/14/2020   ALBUMIN 3.9 11/14/2020   ALKPHOS 108 11/14/2020   LIPASE 25 11/14/2020     Electrolytes Lab Results  Component Value Date   NA 139 11/14/2020   K 3.7 11/14/2020   CL 104 11/14/2020   CALCIUM 9.3 11/14/2020     Bone No results found for: VD25OH, [id number], FG1829HB7,  JI9678LF8, 25OHVITD1, 25OHVITD2, 25OHVITD3, TESTOFREE, TESTOSTERONE   Inflammation (CRP: Acute Phase) (ESR: Chronic Phase) Lab Results  Component Value Date   LATICACIDVEN 1.8 11/12/2020       Note: Above Lab results reviewed.  Recent Imaging Review  DG PAIN CLINIC C-ARM 1-60 MIN NO REPORT Fluoro was used, but no Radiologist interpretation will be provided.  Please refer to "NOTES" tab for provider progress note.  Note: Reviewed        Physical Exam  General appearance: Well nourished, well developed, and well hydrated. In no apparent acute distress Mental status: Alert, oriented x 3 (person, place, & time)       Respiratory: No evidence of acute respiratory distress Eyes: PERLA Vitals: BP (!) 149/71 (BP Location: Right Arm, Patient Position: Sitting, Cuff Size: Normal)    Pulse 73    Temp (!) 97.1 F (36.2 C) (Temporal)    Resp 16    Ht _0  (1.549 m)    Wt 113 lb (51.3 kg)    SpO2 95%    BMI 21.35 kg/m  BMI: Estimated body mass index is 21.35 kg/m as calculated from the following:   Height as of this encounter: _1  (1.549 m).   Weight as of this encounter: 113 lb (51.3 kg). Ideal: Ideal body weight: 47.8 kg (105 lb 6.1 oz) Adjusted ideal body weight: 49.2 kg (108 lb 6.8 oz)  Lumbar Spine Area Exam  Skin & Axial Inspection: No masses, redness, or swelling Alignment: Symmetrical Functional ROM: Pain restricted ROM       Stability: No instability detected Muscle Tone/Strength: Functionally intact. No obvious neuro-muscular anomalies detected. Sensory (Neurological): Musculoskeletal pain pattern   Provocative Tests: Hyperextension/rotation test: deferred today       Lumbar quadrant test (Kemp's test): deferred today       Lateral bending test: deferred today       Patrick's Maneuver: (+) for bilateral S-I arthralgia             FABER* test: (+) for bilateral S-I arthralgia             S-I anterior distraction/compression test: (+)  S-I arthralgia/arthropathy S-I  lateral compression test: (+)   S-I arthralgia/arthropathy   Gait & Posture Assessment  Ambulation: Unassisted Gait: Antalgic Posture: Difficulty standing up straight, due to pain   Lower Extremity Exam      Side: Right lower extremity   Side: Left lower extremity  Stability: No instability observed           Stability: No instability observed          Skin & Extremity Inspection: Skin color, temperature, and hair growth are WNL. No peripheral edema or cyanosis. No masses, redness, swelling, asymmetry, or associated skin lesions. No contractures.   Skin & Extremity Inspection: Skin color, temperature, and hair growth are WNL. No peripheral edema or cyanosis. No masses, redness, swelling, asymmetry, or associated skin lesions. No contractures.  Functional ROM: Pain restricted ROM for hip joint           Functional ROM: Pain restricted ROM                  Muscle Tone/Strength: Functionally intact. No obvious neuro-muscular anomalies detected.   Muscle Tone/Strength: Functionally intact. No obvious neuro-muscular anomalies detected.  Sensory (Neurological): Referred pain pattern from SI joint         Sensory (Neurological): Referred pain pattern from SI joint        DTR: Patellar: deferred today Achilles: deferred today Plantar: deferred today   DTR: Patellar: deferred today Achilles: deferred today Plantar: deferred today  Palpation: No palpable anomalies   Palpation: No palpable anomalies     Assessment   Status Diagnosis  Having a Flare-up Having a Flare-up Having a Flare-up 1. Sacroiliac joint pain   2. SI joint arthritis   3. Piriformis syndrome of both sides   4. Long term prescription opiate use   5. Lumbar spondylosis   6. Myofascial pain   7. Chronic pain syndrome       Plan of Care   Ms. MARSHE SHRESTHA has a current medication list which includes the following long-term medication(s): clonazepam, dicyclomine, famotidine, and linaclotide.  Pharmacotherapy  (Medications Ordered): Meds ordered this encounter  Medications   DISCONTD: oxyCODONE-acetaminophen (PERCOCET) 5-325 MG tablet    Sig: Take 1.5 tablets by mouth every 12 (twelve) hours as needed for severe pain. Must last 30 days.    Dispense:  75 tablet    Refill:  0    Chronic Pain: STOP Act (Not applicable) Fill 1 day early if closed on refill date. Avoid benzodiazepines within 8 hours of opioids   oxyCODONE-acetaminophen (PERCOCET) 5-325 MG tablet    Sig: Take 1.5 tablets by mouth every 12 (twelve) hours as needed for severe pain. Must last 30 days.    Dispense:  75 tablet    Refill:  0    Chronic Pain: STOP Act (Not applicable) Fill 1 day early if closed on refill date. Avoid benzodiazepines within 8 hours of opioids   oxyCODONE-acetaminophen (PERCOCET) 5-325 MG tablet    Sig: Take 1.5 tablets by mouth every 12 (twelve) hours as needed for severe pain. Must last 30 days.    Dispense:  75 tablet    Refill:  0    Chronic Pain: STOP Act (Not applicable) Fill 1 day early if closed on refill date. Avoid benzodiazepines within 8 hours of opioids   Orders:  Orders Placed This Encounter  Procedures   SACROILIAC JOINT INJECTION    Standing Status:   Future    Standing Expiration Date:   07/02/2021  Scheduling Instructions:     Side: bilateral     Sedation: with     Timeframe: ASAP    Order Specific Question:   Where will this procedure be performed?    Answer:   [facility]   Follow-up plan:   Return in about 3 months (around 08/30/2021) for Medication Management, in person.   Recent Visits Date Type Provider Dept  03/13/21 Procedure visit Gillis Santa, MD [facility]  Showing recent visits within past 90 days and meeting all other requirements Today's Visits Date Type Provider Dept  06/01/21 Office Visit Gillis Santa, MD [facility]  Showing today's visits and meeting all other requirements Future Appointments Date Type Provider Dept  08/17/21  Appointment Gillis Santa, MD [facility]  Showing future appointments within next 90 days and meeting all other requirements  I discussed the assessment and treatment plan with the patient. The patient was provided an opportunity to ask questions and all were answered. The patient agreed with the plan and demonstrated an understanding of the instructions.  Patient advised to call back or seek an in-person evaluation if the symptoms or condition worsens.  Duration of encounter: 35 minutes.  Note by: Gillis Santa, MD Date: 06/01/2021; Time: 1:31 PM

## 2021-06-10 ENCOUNTER — Other Ambulatory Visit: Payer: Self-pay | Admitting: Gastroenterology

## 2021-06-13 NOTE — Telephone Encounter (Signed)
LMOVM to call back for pt ?

## 2021-06-14 NOTE — Telephone Encounter (Signed)
Called pt. She has been scheduled for 2/24 at 12:30pm. She will come by and pick up her clenpiq samples and also her instructions. She is aware of instructions.    PA submitted via [employer]. TCS auth# [id number], DOS: 06/14/2021-08/12/2021 EGD auth# [id number]. Dos: 06/14/2021-08/12/2021

## 2021-06-19 ENCOUNTER — Ambulatory Visit: Payer: BC Managed Care – PPO | Admitting: Student in an Organized Health Care Education/Training Program

## 2021-06-19 ENCOUNTER — Telehealth: Payer: Self-pay | Admitting: *Deleted

## 2021-06-19 NOTE — Telephone Encounter (Signed)
Spoke with Patient regarding no show and cancelled appts. Made her aware of 3 no show/cancel policy. Her prior authorization will expire 06/27/21. We will get her prior authorization and call her regarding rescheduling her procedure. She verbalizes understanding.

## 2021-06-21 ENCOUNTER — Ambulatory Visit: Admission: RE | Admit: 2021-06-21 | Payer: BC Managed Care – PPO | Source: Ambulatory Visit

## 2021-06-21 ENCOUNTER — Ambulatory Visit
Payer: BC Managed Care – PPO | Attending: Student in an Organized Health Care Education/Training Program | Admitting: Student in an Organized Health Care Education/Training Program

## 2021-06-21 ENCOUNTER — Other Ambulatory Visit: Payer: Self-pay

## 2021-06-21 DIAGNOSIS — M47818 Spondylosis without myelopathy or radiculopathy, sacral and sacrococcygeal region: Secondary | ICD-10-CM

## 2021-06-21 DIAGNOSIS — G894 Chronic pain syndrome: Secondary | ICD-10-CM

## 2021-06-21 DIAGNOSIS — M533 Sacrococcygeal disorders, not elsewhere classified: Secondary | ICD-10-CM

## 2021-06-21 MED ORDER — FENTANYL CITRATE (PF) 100 MCG/2ML IJ SOLN: 25.0000 ug | INTRAMUSCULAR | Status: DC | PRN

## 2021-06-21 MED ORDER — METHYLPREDNISOLONE ACETATE 80 MG/ML IJ SUSP
80.0000 mg | Freq: Once | INTRAMUSCULAR | Status: DC
Start: 2021-06-21 — End: 2021-06-21

## 2021-06-21 MED ORDER — ROPIVACAINE HCL 2 MG/ML IJ SOLN: 9.0000 mL | Freq: Once | INTRAMUSCULAR | Status: DC

## 2021-06-21 MED ORDER — MIDAZOLAM HCL 5 MG/5ML IJ SOLN: 0.5000 mg | Freq: Once | INTRAMUSCULAR | Status: DC

## 2021-06-21 MED ORDER — LIDOCAINE HCL 2 % IJ SOLN
20.0000 mL | Freq: Once | INTRAMUSCULAR | Status: DC
Start: 2021-06-21 — End: 2021-06-21

## 2021-06-21 NOTE — Progress Notes (Signed)
Patient did not follow n.p.o. instructions and ate this morning.  Consequently we will have to postpone this procedure.  N.p.o. instructions provided to patient.

## 2021-06-22 ENCOUNTER — Telehealth: Payer: Self-pay

## 2021-07-01 ENCOUNTER — Other Ambulatory Visit: Payer: Self-pay | Admitting: Gastroenterology

## 2021-07-05 ENCOUNTER — Ambulatory Visit: Payer: BC Managed Care – PPO | Admitting: Student in an Organized Health Care Education/Training Program

## 2021-07-07 ENCOUNTER — Ambulatory Visit (HOSPITAL_COMMUNITY): Payer: BC Managed Care – PPO | Admitting: Anesthesiology

## 2021-07-07 ENCOUNTER — Encounter (HOSPITAL_COMMUNITY)
Admission: RE | Disposition: A | Discharge status: Home / Self Care | Payer: Self-pay | Source: Home / Self Care | Attending: Internal Medicine

## 2021-07-07 ENCOUNTER — Other Ambulatory Visit: Payer: Self-pay

## 2021-07-07 ENCOUNTER — Ambulatory Visit (HOSPITAL_COMMUNITY)
Admission: RE | Admit: 2021-07-07 | Discharge: 2021-07-07 | Disposition: A | Discharge status: Home / Self Care | Payer: BC Managed Care – PPO | Attending: Internal Medicine | Admitting: Internal Medicine

## 2021-07-07 ENCOUNTER — Encounter (HOSPITAL_COMMUNITY): Payer: Self-pay

## 2021-07-07 DIAGNOSIS — K648 Other hemorrhoids: Secondary | ICD-10-CM | POA: Insufficient documentation

## 2021-07-07 DIAGNOSIS — R112 Nausea with vomiting, unspecified: Secondary | ICD-10-CM | POA: Insufficient documentation

## 2021-07-07 DIAGNOSIS — K319 Disease of stomach and duodenum, unspecified: Secondary | ICD-10-CM | POA: Insufficient documentation

## 2021-07-07 DIAGNOSIS — M1990 Unspecified osteoarthritis, unspecified site: Secondary | ICD-10-CM | POA: Diagnosis not present

## 2021-07-07 DIAGNOSIS — K2970 Gastritis, unspecified, without bleeding: Secondary | ICD-10-CM | POA: Insufficient documentation

## 2021-07-07 DIAGNOSIS — K529 Noninfective gastroenteritis and colitis, unspecified: Secondary | ICD-10-CM | POA: Diagnosis not present

## 2021-07-07 DIAGNOSIS — R933 Abnormal findings on diagnostic imaging of other parts of digestive tract: Secondary | ICD-10-CM | POA: Insufficient documentation

## 2021-07-07 DIAGNOSIS — G709 Myoneural disorder, unspecified: Secondary | ICD-10-CM | POA: Diagnosis not present

## 2021-07-07 DIAGNOSIS — I10 Essential (primary) hypertension: Secondary | ICD-10-CM | POA: Diagnosis not present

## 2021-07-07 DIAGNOSIS — F17210 Nicotine dependence, cigarettes, uncomplicated: Secondary | ICD-10-CM | POA: Insufficient documentation

## 2021-07-07 DIAGNOSIS — K5900 Constipation, unspecified: Secondary | ICD-10-CM | POA: Insufficient documentation

## 2021-07-07 DIAGNOSIS — F419 Anxiety disorder, unspecified: Secondary | ICD-10-CM | POA: Diagnosis not present

## 2021-07-07 DIAGNOSIS — K219 Gastro-esophageal reflux disease without esophagitis: Secondary | ICD-10-CM | POA: Insufficient documentation

## 2021-07-07 DIAGNOSIS — R12 Heartburn: Secondary | ICD-10-CM | POA: Diagnosis not present

## 2021-07-07 DIAGNOSIS — B3781 Candidal esophagitis: Secondary | ICD-10-CM | POA: Diagnosis not present

## 2021-07-07 HISTORY — PX: ESOPHAGEAL BRUSHING: SHX6842

## 2021-07-07 HISTORY — PX: ESOPHAGOGASTRODUODENOSCOPY (EGD) WITH PROPOFOL: SHX5813

## 2021-07-07 HISTORY — PX: BIOPSY: SHX5522

## 2021-07-07 HISTORY — PX: COLONOSCOPY WITH PROPOFOL: SHX5780

## 2021-07-07 HISTORY — DX: Other specified postprocedural states: Z98.890

## 2021-07-07 HISTORY — DX: Nausea with vomiting, unspecified: R11.2

## 2021-07-07 LAB — KOH PREP

## 2021-07-07 SURGERY — COLONOSCOPY WITH PROPOFOL
Anesthesia: General

## 2021-07-07 MED ORDER — LIDOCAINE HCL (CARDIAC) PF 100 MG/5ML IV SOSY
PREFILLED_SYRINGE | INTRAVENOUS | Status: DC | PRN
  Administered 2021-07-07: 50 mg via INTRAVENOUS

## 2021-07-07 MED ORDER — DEXMEDETOMIDINE (PRECEDEX) IN NS 20 MCG/5ML (4 MCG/ML) IV SYRINGE
PREFILLED_SYRINGE | INTRAVENOUS | Status: DC | PRN
  Administered 2021-07-07: 20 ug via INTRAVENOUS

## 2021-07-07 MED ORDER — MIDAZOLAM HCL 2 MG/2ML IJ SOLN
INTRAMUSCULAR | Status: DC | PRN
  Administered 2021-07-07: 2 mg via INTRAVENOUS

## 2021-07-07 MED ORDER — PROPOFOL 10 MG/ML IV BOLUS
INTRAVENOUS | Status: DC | PRN
  Administered 2021-07-07: 50 mg via INTRAVENOUS
  Administered 2021-07-07: 150 mg via INTRAVENOUS

## 2021-07-07 MED ORDER — MIDAZOLAM HCL 2 MG/2ML IJ SOLN
INTRAMUSCULAR | Status: AC
  Filled 2021-07-07: qty 2

## 2021-07-07 MED ORDER — PROPOFOL 500 MG/50ML IV EMUL
INTRAVENOUS | Status: DC | PRN
Start: 2021-07-07 — End: 2021-07-07
  Administered 2021-07-07: 150 ug/kg/min via INTRAVENOUS

## 2021-07-07 MED ORDER — LACTATED RINGERS IV SOLN: INTRAVENOUS | Status: DC

## 2021-07-07 NOTE — H&P (Signed)
Primary Care Physician:  Vidal Schwalbe, MD Primary Gastroenterologist:  Dr. Abbey Chatters  Pre-Procedure History & Physical: HPI:  [name] is a 51 y.o. female is here for an EGD for nausea/vomiting and GERD, and colonoscopy to be performed for history of colitis.   Past Medical History:  Diagnosis Date   Chronic back pain    Colitis    Hypertension    Migraine    Plumbism    blood clot    PONV (postoperative nausea and vomiting)    Renal mass    Uterine cancer [facility])    age 72    Past Surgical History:  Procedure Laterality Date   ABDOMINAL HYSTERECTOMY     BACK SURGERY     CHOLECYSTECTOMY     Complicated by bile leak requiring ERCP with temporary stenting   COLONOSCOPY WITH ESOPHAGOGASTRODUODENOSCOPY (EGD)  07/2009   [facility]: Op notes cannot be seen through care everywhere however pathology showed terminal ileum and random colon biopsies normal.  Stomach biopsy with gastric antral and fundic mucosa with reactive foveolar hyperplasia.  No active gastritis.  Stains negative for H. pylori.   ESOPHAGOGASTRODUODENOSCOPY  06/2007   [facility]: Normal esophagus, gastric mucosal abnormality with erythema, biopsy showed gastric antral and fundic mucosa with reactive foveolar hyperplasia, no gastritis, no H. pylori.   HAND SURGERY      Prior to Admission medications   Medication Sig Start Date End Date Taking? Authorizing Provider  Aspirin-Caffeine (BC FAST PAIN RELIEF PO) Take 1 packet by mouth every 6 (six) hours as needed (pain).   Yes [provider]  clonazePAM (KLONOPIN) 0.5 MG tablet Take 0.5 mg by mouth 3 (three) times daily as needed for anxiety. 09/12/19  Yes [provider]  dicyclomine (BENTYL) 20 MG tablet TAKE (1) TABLET BY MOUTH THREE TIMES DAILY WITH FOOD.HOLD FOR CONSTIPATION 07/03/21  Yes [name], NP  famotidine (PEPCID) 20 MG tablet Take 1 tablet (20 mg total) by mouth 2 (two) times daily. 11/13/20  Yes Tat, Shanon Brow, MD  hydrOXYzine (ATARAX/VISTARIL)  50 MG tablet Take 50 mg by mouth every 6 (six) hours as needed for anxiety. 09/29/20  Yes [provider]  linaclotide Rolan Lipa) 145 MCG CAPS capsule Take 1 capsule (145 mcg total) by mouth daily before breakfast. 04/11/21  Yes Mahala Menghini, PA-C  metoprolol succinate (TOPROL-XL) 100 MG 24 hr tablet Take 100 mg by mouth daily. Take with or immediately following a meal.   Yes [provider]  ondansetron (ZOFRAN-ODT) 4 MG disintegrating tablet DISSOLVE 1 OR 2 TABLETS ON THE TONGUE EVERY 8 HOURS AS NEEDED. 07/03/21  Yes [name], NP  oxyCODONE-acetaminophen (PERCOCET) 5-325 MG tablet Take 1.5 tablets by mouth every 12 (twelve) hours as needed for severe pain. Must last 30 days. Patient taking differently: Take 1-1.5 tablets by mouth every 8 (eight) hours as needed for severe pain. Must last 30 days. 07/01/21 07/31/21 Yes Gillis Santa, MD  oxyCODONE-acetaminophen (PERCOCET) 5-325 MG tablet Take 1.5 tablets by mouth every 12 (twelve) hours as needed for severe pain. Must last 30 days. 07/31/21 08/30/21 Yes Gillis Santa, MD  pantoprazole (PROTONIX) 40 MG tablet Take 40 mg by mouth daily. 03/28/21  Yes [provider]  temazepam (RESTORIL) 30 MG capsule Take 30 mg by mouth at bedtime as needed for sleep.   Yes [provider]  venlafaxine (EFFEXOR) 75 MG tablet Take 75 mg by mouth daily. 06/21/21  Yes [provider]    Allergies as  of 06/14/2021 - Review Complete 06/01/2021  Allergen Reaction Noted   Buprenorphine Other (See Comments) 01/24/2016   Penicillins Swelling and Rash 07/08/2011   Aripiprazole Other (See Comments) 08/01/2020   Seroquel [quetiapine] Other (See Comments) 08/18/2020   Cefaclor Other (See Comments)    Doxycycline Rash 08/01/2020   Gabapentin Nausea And Vomiting 01/24/2016   Ibuprofen Swelling and Rash 07/08/2011   Levofloxacin Rash 01/24/2016   Lisinopril Nausea And Vomiting 08/01/2020   Macrobid [nitrofurantoin monohyd macro]  Rash 11/30/2014   Naproxen Swelling and Rash 07/08/2011    Family History  Problem Relation Age of Onset   Cancer Other    Seizures Other    Stroke Other    Diabetes Other     Social History   Socioeconomic History   Marital status: Married    Spouse name: benito   Number of children: 2   Years of education: Not on file   Highest education level: Associate degree: occupational, Hotel manager, or vocational program  Occupational History    Comment: not employed  Tobacco Use   Smoking status: Every Day    Packs/day: 1.00    Years: 20.00    Pack years: 20.00    Types: Cigarettes   Smokeless tobacco: Never  Vaping Use   Vaping Use: Never used  Substance and Sexual Activity   Alcohol use: No   Drug use: No   Sexual activity: Yes    Birth control/protection: Surgical  Other Topics Concern   Not on file  Social History Narrative   Not on file   Social Determinants of Health   Financial Resource Strain: Not on file  Food Insecurity: Not on file  Transportation Needs: Not on file  Physical Activity: Not on file  Stress: Not on file  Social Connections: Not on file  Intimate Partner Violence: Not on file    Review of Systems: See HPI, otherwise negative ROS  Physical Exam: Vital signs in last 24 hours: Temp:  [98.6 F (37 C)] 98.6 F (37 C) (02/24 1145) Pulse Rate:  [76] 76 (02/24 1145) Resp:  [15] 15 (02/24 1145) BP: (111)/(96) 111/96 (02/24 1145) SpO2:  [98 %] 98 % (02/24 1145) Weight:  [52.2 kg] 52.2 kg (02/24 1145)   General:   Alert,  Well-developed, well-nourished, pleasant and cooperative in NAD Head:  Normocephalic and atraumatic. Eyes:  Sclera clear, no icterus.   Conjunctiva pink. Ears:  Normal auditory acuity. Nose:  No deformity, discharge,  or lesions. Mouth:  No deformity or lesions, dentition normal. Neck:  Supple; no masses or thyromegaly. Lungs:  Clear throughout to auscultation.   No wheezes, crackles, or rhonchi. No acute distress. Heart:   Regular rate and rhythm; no murmurs, clicks, rubs,  or gallops. Abdomen:  Soft, nontender and nondistended. No masses, hepatosplenomegaly or hernias noted. Normal bowel sounds, without guarding, and without rebound.   Msk:  Symmetrical without gross deformities. Normal posture. Extremities:  Without clubbing or edema. Neurologic:  Alert and  oriented x4;  grossly normal neurologically. Skin:  Intact without significant lesions or rashes. Cervical Nodes:  No significant cervical adenopathy. Psych:  Alert and cooperative. Normal mood and affect.  Impression/Plan: [name] is here for an EGD for nausea/vomiting and GERD, and colonoscopy to be performed for history of colitis.   The risks of the procedure including infection, bleed, or perforation as well as benefits, limitations, alternatives and imponderables have been reviewed with the patient. Questions have been answered. All parties agreeable.

## 2021-07-07 NOTE — Anesthesia Postprocedure Evaluation (Signed)
Anesthesia Post Note  Patient: [name]  Procedure(s) Performed: COLONOSCOPY WITH PROPOFOL ESOPHAGOGASTRODUODENOSCOPY (EGD) WITH PROPOFOL BIOPSY ESOPHAGEAL BRUSHING  Patient location during evaluation: Endoscopy Anesthesia Type: General Level of consciousness: awake and alert and oriented Pain management: pain level controlled Vital Signs Assessment: post-procedure vital signs reviewed and stable Respiratory status: spontaneous breathing, nonlabored ventilation and respiratory function stable Cardiovascular status: blood pressure returned to baseline and stable Postop Assessment: no apparent nausea or vomiting Anesthetic complications: no   No notable events documented.   Last Vitals:  Vitals:   07/07/21 1248 07/07/21 1251  BP: (!) 85/57 94/67  Pulse: 68 70  Resp: 18 18  Temp:    SpO2: 97% 98%    Last Pain:  Vitals:   07/07/21 1246  TempSrc: Oral  PainSc: 7                  Addalie Calles C Ivyana Locey

## 2021-07-07 NOTE — Anesthesia Preprocedure Evaluation (Addendum)
Anesthesia Evaluation  Patient identified by MRN, date of birth, ID band Patient awake    Reviewed: Allergy & Precautions, NPO status , Patient's Chart, lab work & pertinent test results, reviewed documented beta blocker date and time   History of Anesthesia Complications (+) PONV and history of anesthetic complications  Airway Mallampati: II  TM Distance: >3 FB Neck ROM: Full    Dental  (+) Upper Dentures, Lower Dentures   Pulmonary Current SmokerPatient did not abstain from smoking.,    Pulmonary exam normal breath sounds clear to auscultation       Cardiovascular hypertension, Pt. on medications and Pt. on home beta blockers Normal cardiovascular exam Rhythm:Regular Rate:Normal     Neuro/Psych  Headaches, PSYCHIATRIC DISORDERS Anxiety  Neuromuscular disease    GI/Hepatic Neg liver ROS, GERD  Medicated and Controlled,  Endo/Other  negative endocrine ROS  Renal/GU negative Renal ROS  negative genitourinary   Musculoskeletal  (+) Arthritis , Osteoarthritis,    Abdominal   Peds negative pediatric ROS (+)  Hematology negative hematology ROS (+)   Anesthesia Other Findings   Reproductive/Obstetrics negative OB ROS                            Anesthesia Physical Anesthesia Plan  ASA: 2  Anesthesia Plan: General   Post-op Pain Management: Minimal or no pain anticipated   Induction: Intravenous  PONV Risk Score and Plan: TIVA  Airway Management Planned: Nasal Cannula and Natural Airway  Additional Equipment:   Intra-op Plan:   Post-operative Plan:   Informed Consent: I have reviewed the patients History and Physical, chart, labs and discussed the procedure including the risks, benefits and alternatives for the proposed anesthesia with the patient or authorized representative who has indicated his/her understanding and acceptance.     Dental advisory given  Plan Discussed with:  CRNA and Surgeon  Anesthesia Plan Comments:         Anesthesia Quick Evaluation

## 2021-07-07 NOTE — Discharge Instructions (Addendum)
EGD Discharge instructions Please read the instructions outlined below and refer to this sheet in the next few weeks. These discharge instructions provide you with general information on caring for yourself after you leave the hospital. Your doctor may also give you specific instructions. While your treatment has been planned according to the most current medical practices available, unavoidable complications occasionally occur. If you have any problems or questions after discharge, please call your doctor. ACTIVITY You may resume your regular activity but move at a slower pace for the next 24 hours.  Take frequent rest periods for the next 24 hours.  Walking will help expel (get rid of) the air and reduce the bloated feeling in your abdomen.  No driving for 24 hours (because of the anesthesia (medicine) used during the test).  You may shower.  Do not sign any important legal documents or operate any machinery for 24 hours (because of the anesthesia used during the test).  NUTRITION Drink plenty of fluids.  You may resume your normal diet.  Begin with a light meal and progress to your normal diet.  Avoid alcoholic beverages for 24 hours or as instructed by your caregiver.  MEDICATIONS You may resume your normal medications unless your caregiver tells you otherwise.  WHAT YOU CAN EXPECT TODAY You may experience abdominal discomfort such as a feeling of fullness or gas pains.  FOLLOW-UP Your doctor will discuss the results of your test with you.  SEEK IMMEDIATE MEDICAL ATTENTION IF ANY OF THE FOLLOWING OCCUR: Excessive nausea (feeling sick to your stomach) and/or vomiting.  Severe abdominal pain and distention (swelling).  Trouble swallowing.  Temperature over 101 F (37.8 C).  Rectal bleeding or vomiting of blood.      Colonoscopy Discharge Instructions  Read the instructions outlined below and refer to this sheet in the next few weeks. These discharge instructions provide you  with general information on caring for yourself after you leave the hospital. Your doctor may also give you specific instructions. While your treatment has been planned according to the most current medical practices available, unavoidable complications occasionally occur.   ACTIVITY You may resume your regular activity, but move at a slower pace for the next 24 hours.  Take frequent rest periods for the next 24 hours.  Walking will help get rid of the air and reduce the bloated feeling in your belly (abdomen).  No driving for 24 hours (because of the medicine (anesthesia) used during the test).   Do not sign any important legal documents or operate any machinery for 24 hours (because of the anesthesia used during the test).  NUTRITION Drink plenty of fluids.  You may resume your normal diet as instructed by your doctor.  Begin with a light meal and progress to your normal diet. Heavy or fried foods are harder to digest and may make you feel sick to your stomach (nauseated).  Avoid alcoholic beverages for 24 hours or as instructed.  MEDICATIONS You may resume your normal medications unless your doctor tells you otherwise.  WHAT YOU CAN EXPECT TODAY Some feelings of bloating in the abdomen.  Passage of more gas than usual.  Spotting of blood in your stool or on the toilet paper.  IF YOU HAD POLYPS REMOVED DURING THE COLONOSCOPY: No aspirin products for 7 days or as instructed.  No alcohol for 7 days or as instructed.  Eat a soft diet for the next 24 hours.  FINDING OUT THE RESULTS OF YOUR TEST Not all test results  are available during your visit. If your test results are not back during the visit, make an appointment with your caregiver to find out the results. Do not assume everything is normal if you have not heard from your caregiver or the medical facility. It is important for you to follow up on all of your test results.  SEEK IMMEDIATE MEDICAL ATTENTION IF: You have more than a  spotting of blood in your stool.  Your belly is swollen (abdominal distention).  You are nauseated or vomiting.  You have a temperature over 101.  You have abdominal pain or discomfort that is severe or gets worse throughout the day.   Your EGD revealed mild amount inflammation in your stomach.  I took biopsies of this to rule out infection with a bacteria called H. pylori.  Await pathology results, my office will contact you.  Also appears that you have a yeast infection of your esophagus.  I took samples of this as well.  If positive we will treat with a 14-day course of Diflucan.  Your colonoscopy was relatively unremarkable.  I did not find any polyps or evidence of colon cancer.  I did not find any active inflammation indicative of underlying colitis.  I took extensive biopsies of your colon as well.  We will call you with these results.  Otherwise follow-up with Magda Paganini in 3 to 4 months  CALL OFFICE Monday FOR FOLLOW UP APPOINTMENT   I hope you have a great rest of your week!  Elon Alas. Abbey Chatters, D.O. Gastroenterology and Hepatology [facility]

## 2021-07-07 NOTE — Anesthesia Procedure Notes (Signed)
Date/Time: 07/07/2021 12:21 PM Performed by: Orlie Dakin, CRNA Pre-anesthesia Checklist: Patient identified, Emergency Drugs available, Suction available and Patient being monitored Patient Re-evaluated:Patient Re-evaluated prior to induction Oxygen Delivery Method: Nasal cannula Induction Type: IV induction Placement Confirmation: positive ETCO2

## 2021-07-07 NOTE — Op Note (Signed)
[facility] Patient Name: [name] Procedure Date: 07/07/2021 12:02 PM MRN: [id number] Date of Birth: 20-Sep-1970 Attending MD: Elon Alas. Abbey Chatters DO CSN: [id number] Age: 51 Admit Type: Outpatient Procedure:                Colonoscopy Indications:              Abnormal CT of the GI tract, Follow-up of colitis Providers:                Elon Alas. Abbey Chatters, DO, Lambert Mody, Aram Candela Referring MD:              Medicines:                See the Anesthesia note for documentation of the                            administered medications Complications:            No immediate complications. Estimated Blood Loss:     Estimated blood loss was minimal. Procedure:                Pre-Anesthesia Assessment:                           - The anesthesia plan was to use monitored                            anesthesia care (MAC).                           After obtaining informed consent, the colonoscope                            was passed under direct vision. Throughout the                            procedure, the patient's blood pressure, pulse, and                            oxygen saturations were monitored continuously. The                            [id number]-HQ190L ([id number]) scope was introduced through                            the anus and advanced to the the terminal ileum,                            with identification of the appendiceal orifice and                            IC valve. The colonoscopy was performed without                            difficulty. The patient  tolerated the procedure                            well. The quality of the bowel preparation was                            evaluated using the BBPS [facility] Bowel Preparation                            Scale) with scores of: Right Colon = 2 (minor                            amount of residual staining, small fragments of                            stool and/or opaque liquid, but  mucosa seen well),                            Transverse Colon = 2 (minor amount of residual                            staining, small fragments of stool and/or opaque                            liquid, but mucosa seen well) and Left Colon = 2                            (minor amount of residual staining, small fragments                            of stool and/or opaque liquid, but mucosa seen                            well). The total BBPS score equals 6. The quality                            of the bowel preparation was fair. Scope In: 12:27:59 PM Scope Out: 12:42:08 PM Scope Withdrawal Time: 0 hours 9 minutes 23 seconds  Total Procedure Duration: 0 hours 14 minutes 9 seconds  Findings:      The perianal and digital rectal examinations were normal.      Non-bleeding internal hemorrhoids were found during endoscopy.      The terminal ileum appeared normal. Biopsies were taken with a cold       forceps for histology.      Biopsies were taken with a cold forceps in the entire colon for       histology. Impression:               - Preparation of the colon was fair.                           - Non-bleeding internal hemorrhoids.                           -  The examined portion of the ileum was normal.                            Biopsied.                           - Biopsies were taken with a cold forceps for                            histology in the entire colon. Moderate Sedation:      Per Anesthesia Care Recommendation:           - Patient has a contact number available for                            emergencies. The signs and symptoms of potential                            delayed complications were discussed with the                            patient. Return to normal activities tomorrow.                            Written discharge instructions were provided to the                            patient.                           - Resume previous diet.                            - Continue present medications.                           - Await pathology results.                           - Repeat colonoscopy date to be determined after                            pending pathology results are reviewed for                            surveillance based on pathology results.                           - Return to GI clinic in 4 months. Procedure Code(s):        --- Professional ---                           [id number], Colonoscopy, flexible; with biopsy, single                            or multiple Diagnosis Code(s):        ---  Professional ---                           K64.8, Other hemorrhoids                           K52.9, Noninfective gastroenteritis and colitis,                            unspecified                           R93.3, Abnormal findings on diagnostic imaging of                            other parts of digestive tract CPT copyright 2019 [employer]. All rights reserved. The codes documented in this report are preliminary and upon coder review may  be revised to meet current compliance requirements. Elon Alas. Abbey Chatters, DO [place] Abbey Chatters, DO 07/07/2021 12:47:25 PM This report has been signed electronically. Number of Addenda: 0

## 2021-07-07 NOTE — Op Note (Signed)
[facility] Patient Name: [name] Procedure Date: 07/07/2021 12:03 PM MRN: [id number] Date of Birth: [birth year] Attending MD: Elon Alas. Abbey Chatters DO CSN: [id number] Age: 51 Admit Type: Outpatient Procedure:                Upper GI endoscopy Indications:              Heartburn, Nausea with vomiting Providers:                Elon Alas. Abbey Chatters, DO, Lambert Mody, Aram Candela Referring MD:              Medicines:                See the Anesthesia note for documentation of the                            administered medications Complications:            No immediate complications. Estimated Blood Loss:     Estimated blood loss was minimal. Procedure:                Pre-Anesthesia Assessment:                           - The anesthesia plan was to use monitored                            anesthesia care (MAC).                           After obtaining informed consent, the endoscope was                            passed under direct vision. Throughout the                            procedure, the patient's blood pressure, pulse, and                            oxygen saturations were monitored continuously. The                            [id number]-H190 ([id number]) scope was introduced through the                            mouth, and advanced to the second part of duodenum.                            The upper GI endoscopy was accomplished without                            difficulty. The patient tolerated the procedure                            well. Scope In: 12:16:57 PM Scope  Out: 12:19:43 PM Total Procedure Duration: 0 hours 2 minutes 46 seconds  Findings:      Moderately severe esophagitis with no bleeding was found in the lower       third of the esophagus. Cells for cytology were obtained by brushing.      Diffuse moderate inflammation characterized by erythema and linear       erosions was found in the entire examined stomach. Biopsies were  taken       with a cold forceps for Helicobacter pylori testing.      The duodenal bulb, first portion of the duodenum and second portion of       the duodenum were normal. Impression:               - Moderately severe candidiasis esophagitis with no                            bleeding. Cells for cytology obtained.                           - Gastritis. Biopsied.                           - Normal duodenal bulb, first portion of the                            duodenum and second portion of the duodenum. Moderate Sedation:      Per Anesthesia Care Recommendation:           - Patient has a contact number available for                            emergencies. The signs and symptoms of potential                            delayed complications were discussed with the                            patient. Return to normal activities tomorrow.                            Written discharge instructions were provided to the                            patient.                           - Resume previous diet.                           - Continue present medications.                           - Await pathology results.                           - Use a proton pump inhibitor PO BID.                           -  Treat candidal esophagitis if cytology positive                            with 14 day course of Diflucan                           - Return to GI clinic in 4 months. Procedure Code(s):        --- Professional ---                           [phone], Esophagogastroduodenoscopy, flexible,                            transoral; with biopsy, single or multiple Diagnosis Code(s):        --- Professional ---                           B37.81, Candidal esophagitis                           K29.70, Gastritis, unspecified, without bleeding                           R12, Heartburn                           R11.2, Nausea with vomiting, unspecified CPT copyright 2019 [employer] Association. All rights  reserved. The codes documented in this report are preliminary and upon coder review may  be revised to meet current compliance requirements. Elon Alas. Abbey Chatters, DO [name], DO 07/07/2021 12:22:02 PM This report has been signed electronically. Number of Addenda: 0

## 2021-07-07 NOTE — Transfer of Care (Signed)
Immediate Anesthesia Transfer of Care Note  Patient: [name]  Procedure(s) Performed: COLONOSCOPY WITH PROPOFOL ESOPHAGOGASTRODUODENOSCOPY (EGD) WITH PROPOFOL BIOPSY ESOPHAGEAL BRUSHING  Patient Location: Endoscopy Unit  Anesthesia Type:General  Level of Consciousness: awake  Airway & Oxygen Therapy: Patient Spontanous Breathing  Post-op Assessment: Report given to RN and Post -op Vital signs reviewed and stable  Post vital signs: Reviewed and stable  Last Vitals:  Vitals Value Taken Time  BP    Temp    Pulse    Resp    SpO2      Last Pain:  Vitals:   07/07/21 1213  TempSrc:   PainSc: 7       Patients Stated Pain Goal: 4 (47/99/87 2158)  Complications: No notable events documented.

## 2021-07-11 ENCOUNTER — Telehealth: Payer: Self-pay | Admitting: Internal Medicine

## 2021-07-11 LAB — SURGICAL PATHOLOGY

## 2021-07-11 MED ORDER — FLUCONAZOLE 200 MG PO TABS: ORAL_TABLET | ORAL | 0 refills | Status: DC

## 2021-07-11 NOTE — Telephone Encounter (Signed)
Please let patient know esophageal sample showed that she does have candidal esophagitis, yeast infection.  I will send in Diflucan x14 days to her pharmacy.  She will need to take 2 pills on day 1 and then 1 pill daily thereafter.  All of her colon biopsies were normal.  No active inflammation throughout her colon.  Follow-up with GI as previously scheduled.  Thank you

## 2021-07-11 NOTE — Telephone Encounter (Signed)
Pt was made aware and verbalized understanding.  

## 2021-07-11 NOTE — Telephone Encounter (Signed)
No ans, vm not set up.

## 2021-07-13 ENCOUNTER — Encounter (HOSPITAL_COMMUNITY): Payer: Self-pay | Admitting: Internal Medicine

## 2021-07-22 ENCOUNTER — Other Ambulatory Visit: Payer: Self-pay | Admitting: Gastroenterology

## 2021-07-24 NOTE — Telephone Encounter (Signed)
Last office visit 04/11/2021 ?

## 2021-07-31 ENCOUNTER — Ambulatory Visit: Payer: BC Managed Care – PPO | Admitting: Student in an Organized Health Care Education/Training Program

## 2021-07-31 ENCOUNTER — Other Ambulatory Visit: Payer: Self-pay | Admitting: Gastroenterology

## 2021-07-31 NOTE — Telephone Encounter (Signed)
Last office visit 04/11/21 ?

## 2021-08-02 ENCOUNTER — Encounter: Payer: Self-pay | Admitting: Gastroenterology

## 2021-08-07 ENCOUNTER — Other Ambulatory Visit: Payer: Self-pay

## 2021-08-07 ENCOUNTER — Encounter: Payer: Self-pay | Admitting: Student in an Organized Health Care Education/Training Program

## 2021-08-07 ENCOUNTER — Other Ambulatory Visit: Payer: Self-pay | Admitting: Gastroenterology

## 2021-08-07 ENCOUNTER — Ambulatory Visit (HOSPITAL_BASED_OUTPATIENT_CLINIC_OR_DEPARTMENT_OTHER): Payer: BC Managed Care – PPO | Admitting: Student in an Organized Health Care Education/Training Program

## 2021-08-07 ENCOUNTER — Ambulatory Visit
Admission: RE | Admit: 2021-08-07 | Discharge: 2021-08-07 | Disposition: A | Discharge status: Home / Self Care | Payer: BC Managed Care – PPO | Source: Ambulatory Visit | Attending: Student in an Organized Health Care Education/Training Program | Admitting: Student in an Organized Health Care Education/Training Program

## 2021-08-07 VITALS — BP 135/63 | HR 67 | Temp 97.9°F | Resp 16 | Ht 61.0 in | Wt 115.0 lb

## 2021-08-07 DIAGNOSIS — G894 Chronic pain syndrome: Secondary | ICD-10-CM

## 2021-08-07 DIAGNOSIS — M1388 Other specified arthritis, other site: Secondary | ICD-10-CM | POA: Insufficient documentation

## 2021-08-07 DIAGNOSIS — M533 Sacrococcygeal disorders, not elsewhere classified: Secondary | ICD-10-CM | POA: Diagnosis present

## 2021-08-07 DIAGNOSIS — M47818 Spondylosis without myelopathy or radiculopathy, sacral and sacrococcygeal region: Secondary | ICD-10-CM

## 2021-08-07 MED ORDER — LIDOCAINE HCL (PF) 2 % IJ SOLN
INTRAMUSCULAR | Status: AC
  Filled 2021-08-07: qty 15

## 2021-08-07 MED ORDER — DEXAMETHASONE SODIUM PHOSPHATE 10 MG/ML IJ SOLN: 10.0000 mg | Freq: Once | INTRAMUSCULAR | Status: DC

## 2021-08-07 MED ORDER — MIDAZOLAM HCL 5 MG/5ML IJ SOLN
INTRAMUSCULAR | Status: AC
  Filled 2021-08-07: qty 5

## 2021-08-07 MED ORDER — IOHEXOL 180 MG/ML  SOLN
10.0000 mL | Freq: Once | INTRAMUSCULAR | Status: AC
  Administered 2021-08-07: 10 mL via INTRA_ARTICULAR

## 2021-08-07 MED ORDER — METHYLPREDNISOLONE ACETATE 80 MG/ML IJ SUSP: 80.0000 mg | Freq: Once | INTRAMUSCULAR | Status: DC

## 2021-08-07 MED ORDER — IOHEXOL 180 MG/ML  SOLN
INTRAMUSCULAR | Status: AC
  Filled 2021-08-07: qty 10

## 2021-08-07 MED ORDER — LACTATED RINGERS IV SOLN
1000.0000 mL | Freq: Once | INTRAVENOUS | Status: AC
Start: 2021-08-07 — End: 2021-08-07
  Administered 2021-08-07: 1000 mL via INTRAVENOUS

## 2021-08-07 MED ORDER — ROPIVACAINE HCL 2 MG/ML IJ SOLN
INTRAMUSCULAR | Status: AC
  Filled 2021-08-07: qty 20

## 2021-08-07 MED ORDER — DEXAMETHASONE SODIUM PHOSPHATE 10 MG/ML IJ SOLN
INTRAMUSCULAR | Status: AC
  Filled 2021-08-07: qty 2

## 2021-08-07 MED ORDER — ROPIVACAINE HCL 2 MG/ML IJ SOLN
9.0000 mL | Freq: Once | INTRAMUSCULAR | Status: AC
  Administered 2021-08-07: 9 mL via PERINEURAL

## 2021-08-07 MED ORDER — MIDAZOLAM HCL 5 MG/5ML IJ SOLN
0.5000 mg | Freq: Once | INTRAMUSCULAR | Status: AC
  Administered 2021-08-07: 1 mg via INTRAVENOUS

## 2021-08-07 MED ORDER — FENTANYL CITRATE (PF) 100 MCG/2ML IJ SOLN
INTRAMUSCULAR | Status: AC
  Filled 2021-08-07: qty 2

## 2021-08-07 MED ORDER — FENTANYL CITRATE (PF) 100 MCG/2ML IJ SOLN
25.0000 ug | INTRAMUSCULAR | Status: DC | PRN
  Administered 2021-08-07: 50 ug via INTRAVENOUS

## 2021-08-07 MED ORDER — LIDOCAINE HCL 2 % IJ SOLN
20.0000 mL | Freq: Once | INTRAMUSCULAR | Status: AC
  Administered 2021-08-07: 100 mg

## 2021-08-07 NOTE — Progress Notes (Signed)
Patient's Name: [name]  MRN: [id number]  ?Referring Provider: Vidal Schwalbe, MD  DOB: 1970/06/09  ?PCP: Vidal Schwalbe, MD  DOS: 08/07/2021  ?Note by: Gillis Santa, MD  Service setting: Ambulatory outpatient  ?Specialty: Interventional Pain Management  Patient type: Established  ?Location: [facility] (AMB) Pain Management Facility  Visit type: Interventional Procedure  ? ?Primary Reason for Visit: Interventional Pain Management Treatment. ?CC: Back Pain ? ? ?Procedure:  Anesthesia, Analgesia, Anxiolysis:  ?Type: Therapeutic Sacroiliac Joint Steroid Injection #1 for 2023 ?Region: Inferior Lumbosacral Region ?Level: PIIS (Posterior Inferior Iliac Spine) ?Laterality: Bilateral  Type: Moderate (Conscious) Sedation combined with Local Anesthesia ?Indication(s): Analgesia and Anxiety ?Route: Intravenous (IV) ?IV Access: Secured ?Sedation: Meaningful verbal contact was maintained at all times during the procedure  ?Local Anesthetic: Lidocaine 1%  ? ?Indications: ?1. Sacroiliac joint pain   ?2. SI joint arthritis   ?3. Chronic pain syndrome   ? ?Pain Score: ?Pre-procedure: 6 /10 ?Post-procedure: 0-No pain/10 ? ? ?Pre-op Assessment:  ?[name] is a 51 y.o. (year old), female patient, seen today for interventional treatment. She  has a past surgical history that includes Abdominal hysterectomy; Cholecystectomy; Hand surgery; Back surgery; Colonoscopy with esophagogastroduodenoscopy (egd) (07/2009); Esophagogastroduodenoscopy (06/2007); Colonoscopy with propofol (N/A, 07/07/2021); Esophagogastroduodenoscopy (egd) with propofol (N/A, 07/07/2021); biopsy (07/07/2021); and Esophageal brushing (07/07/2021). [name] has a current medication list which includes the following prescription(s): aspirin-caffeine, clonazepam, dicyclomine, famotidine, fluconazole, hydroxyzine, linaclotide, metoprolol succinate, ondansetron, oxycodone-acetaminophen, pantoprazole, temazepam, and venlafaxine, and the following Facility-Administered  Medications: dexamethasone, dexamethasone, and fentanyl. Her primarily concern today is the Back Pain ? ? ?Initial Vital Signs:  ?Pulse/HCG Rate: 67ECG Heart Rate: 69 ?Temp: (!) 97.1 ?F (36.2 ?C) ?Resp: 16 ?BP: (!) 153/61 ?SpO2: 100 % ? ?BMI: Estimated body mass index is 21.73 kg/m? as calculated from the following: ?  Height as of this encounter: '5\' 1"'$  (1.549 m). ?  Weight as of this encounter: 115 lb (52.2 kg). ? ?Risk Assessment: ?Allergies: Reviewed. She is allergic to buprenorphine, penicillins, aripiprazole, seroquel [quetiapine], cefaclor, doxycycline, gabapentin, ibuprofen, levofloxacin, lisinopril, macrobid [nitrofurantoin monohyd macro], and naproxen.  ?Allergy Precautions: None required ?Coagulopathies: Reviewed. None identified.  ?Blood-thinner therapy: None at this time ?Active Infection(s): Reviewed. None identified. [name] is afebrile ? ?Site Confirmation: [name] was asked to confirm the procedure and laterality before marking the site ?Procedure checklist: Completed ?Consent: Before the procedure and under the influence of no sedative(s), amnesic(s), or anxiolytics, the patient was informed of the treatment options, risks and possible complications. To fulfill our ethical and legal obligations, as recommended by the American Medical Association's Code of Ethics, I have informed the patient of my clinical impression; the nature and purpose of the treatment or procedure; the risks, benefits, and possible complications of the intervention; the alternatives, including doing nothing; the risk(s) and benefit(s) of the alternative treatment(s) or procedure(s); and the risk(s) and benefit(s) of doing nothing. ?The patient was provided information about the general risks and possible complications associated with the procedure. These may include, but are not limited to: failure to achieve desired goals, infection, bleeding, organ or nerve damage, allergic reactions, paralysis, and death. ?In addition,  the patient was informed of those risks and complications associated to the procedure, such as failure to decrease pain; infection; bleeding; organ or nerve damage with subsequent damage to sensory, motor, and/or autonomic systems, resulting in permanent pain, numbness, and/or weakness of one or several areas of the body; allergic reactions; (i.e.: anaphylactic reaction); and/or death. ?Furthermore, the patient was informed  of those risks and complications associated with the medications. These include, but are not limited to: allergic reactions (i.e.: anaphylactic or anaphylactoid reaction(s)); adrenal axis suppression; blood sugar elevation that in diabetics may result in ketoacidosis or comma; water retention that in patients with history of congestive heart failure may result in shortness of breath, pulmonary edema, and decompensation with resultant heart failure; weight gain; swelling or edema; medication-induced neural toxicity; particulate matter embolism and blood vessel occlusion with resultant organ, and/or nervous system infarction; and/or aseptic necrosis of one or more joints. ?Finally, the patient was informed that Medicine is not an exact science; therefore, there is also the possibility of unforeseen or unpredictable risks and/or possible complications that may result in a catastrophic outcome. The patient indicated having understood very clearly. We have given the patient no guarantees and we have made no promises. Enough time was given to the patient to ask questions, all of which were answered to the patient's satisfaction. [name] has indicated that she wanted to continue with the procedure. ?Attestation: I, the ordering provider, attest that I have discussed with the patient the benefits, risks, side-effects, alternatives, likelihood of achieving goals, and potential problems during recovery for the procedure that I have provided informed consent. ?Date  Time: 08/07/2021  7:51  AM ? ?Pre-Procedure Preparation:  ?Monitoring: As per clinic protocol. Respiration, ETCO2, SpO2, BP, heart rate and rhythm monitor placed and checked for adequate function ?Safety Precautions: Patient was assessed for positional comfort and pressure points before starting the procedure. ?Time-out: I initiated and conducted the "Time-out" before starting the procedure, as per protocol. The patient was asked to participate by confirming the accuracy of the "Time Out" information. Verification of the correct person, site, and procedure were performed and confirmed by me, the nursing staff, and the patient. "Time-out" conducted as per Joint Commission's Universal Protocol ([email or web address].01.01.01). ?Time: [phone] ? ?Description of Procedure:       ?Position: Prone ?Target Area: Inferior, posterior, aspect of the sacroiliac fissure ?Approach: Posterior, paraspinal, ipsilateral approach. ?Area Prepped: Entire Lower Lumbosacral Region ?Prepping solution: ChloraPrep (2% chlorhexidine gluconate and 70% isopropyl alcohol) ?Safety Precautions: Aspiration looking for blood return was conducted prior to all injections. At no point did we inject any substances, as a needle was being advanced. No attempts were made at seeking any paresthesias. Safe injection practices and needle disposal techniques used. Medications properly checked for expiration dates. SDV (single dose vial) medications used. ?Description of the Procedure: Protocol guidelines were followed. The patient was placed in position over the procedure table. The target area was identified and the area prepped in the usual manner. Skin & deeper tissues infiltrated with local anesthetic. Appropriate amount of time allowed to pass for local anesthetics to take effect. The procedure needle was advanced under fluoroscopic guidance into the sacroiliac joint until a firm endpoint was obtained. Proper needle placement secured. Negative aspiration confirmed. Solution injected in intermittent  fashion, asking for systemic symptoms every 0.5cc of injectate. The needles were then removed and the area cleansed, making sure to leave some of the prepping solution back to take advantage of its long term bactericidal propert

## 2021-08-07 NOTE — Patient Instructions (Signed)

## 2021-08-07 NOTE — Progress Notes (Signed)
Safety precautions to be maintained throughout the outpatient stay will include: orient to surroundings, keep bed in low position, maintain call bell within reach at all times, provide assistance with transfer out of bed and ambulation.  

## 2021-08-07 NOTE — Telephone Encounter (Signed)
Last ov 04/11/21 ?

## 2021-08-08 ENCOUNTER — Telehealth: Payer: Self-pay | Admitting: *Deleted

## 2021-08-08 ENCOUNTER — Other Ambulatory Visit: Payer: Self-pay | Admitting: Gastroenterology

## 2021-08-08 NOTE — Telephone Encounter (Signed)
Last ov 04/11/21 ?

## 2021-08-08 NOTE — Telephone Encounter (Signed)
I called patient and told her we needed to bring her in before June and she agreed to come on 5/9 at 2pm to see LSL and I would cancel the 10/16/2021 appointment.  ?

## 2021-08-08 NOTE — Telephone Encounter (Signed)
Patient needs in office visit non urgent before June. ?

## 2021-08-08 NOTE — Telephone Encounter (Signed)
Attempted to call for post procedure follow-up. No answer, no voicemail. 

## 2021-08-08 NOTE — Telephone Encounter (Signed)
I declined this refill yesterday. Not sure why she is taking so regularly.  ? ?I will refill zofran but we need to be able to see her before June so that we can determine how best to manage her symptoms.  ? ?Can we get her in for in person ov, nonurgent. ?

## 2021-08-17 ENCOUNTER — Encounter: Payer: BC Managed Care – PPO | Admitting: Student in an Organized Health Care Education/Training Program

## 2021-09-05 ENCOUNTER — Ambulatory Visit
Payer: BC Managed Care – PPO | Attending: Student in an Organized Health Care Education/Training Program | Admitting: Student in an Organized Health Care Education/Training Program

## 2021-09-05 ENCOUNTER — Encounter: Payer: Self-pay | Admitting: Student in an Organized Health Care Education/Training Program

## 2021-09-05 VITALS — BP 113/61 | HR 64 | Temp 97.7°F | Ht 61.0 in | Wt 113.0 lb

## 2021-09-05 DIAGNOSIS — G894 Chronic pain syndrome: Secondary | ICD-10-CM | POA: Diagnosis present

## 2021-09-05 DIAGNOSIS — M533 Sacrococcygeal disorders, not elsewhere classified: Secondary | ICD-10-CM | POA: Insufficient documentation

## 2021-09-05 DIAGNOSIS — M47818 Spondylosis without myelopathy or radiculopathy, sacral and sacrococcygeal region: Secondary | ICD-10-CM | POA: Diagnosis present

## 2021-09-05 DIAGNOSIS — Z79891 Long term (current) use of opiate analgesic: Secondary | ICD-10-CM | POA: Insufficient documentation

## 2021-09-05 DIAGNOSIS — G5703 Lesion of sciatic nerve, bilateral lower limbs: Secondary | ICD-10-CM | POA: Insufficient documentation

## 2021-09-05 DIAGNOSIS — M47816 Spondylosis without myelopathy or radiculopathy, lumbar region: Secondary | ICD-10-CM | POA: Insufficient documentation

## 2021-09-05 MED ORDER — OXYCODONE-ACETAMINOPHEN 5-325 MG PO TABS: 1.0000 | ORAL_TABLET | Freq: Three times a day (TID) | ORAL | 0 refills | Status: AC | PRN

## 2021-09-05 MED ORDER — OXYCODONE-ACETAMINOPHEN 5-325 MG PO TABS: 1.0000 | ORAL_TABLET | Freq: Three times a day (TID) | ORAL | 0 refills | Status: DC | PRN

## 2021-09-05 NOTE — Patient Instructions (Signed)
______________________________________________________________________ ? ?Preparing for Procedure with Sedation ? ?NOTICE: Due to recent regulatory changes, starting on December 12, 2020, procedures requiring intravenous (IV) sedation will no longer be performed at the [place].  These types of procedures are required to be performed at [facility] ambulatory surgery facility.  We are very sorry for the inconvenience. ? ?Procedure appointments are limited to planned procedures: ?No Prescription Refills. ?No disability issues will be discussed. ?No medication changes will be discussed. ? ?Instructions: ?Oral Intake: Do not eat or drink anything for at least 8 hours prior to your procedure. (Exception: Blood Pressure Medication. See below.) ?Transportation: A driver is required. You may not drive yourself after the procedure. ?Blood Pressure Medicine: Do not forget to take your blood pressure medicine with a sip of water the morning of the procedure. If your Diastolic (lower reading) is above 100 mmHg, elective cases will be cancelled/rescheduled. ?Blood thinners: These will need to be stopped for procedures. Notify our staff if you are taking any blood thinners. Depending on which one you take, there will be specific instructions on how and when to stop it. ?Diabetics on insulin: Notify the staff so that you can be scheduled 1st case in the morning. If your diabetes requires high dose insulin, take only ? of your normal insulin dose the morning of the procedure and notify the staff that you have done so. ?Preventing infections: Shower with an antibacterial soap the morning of your procedure. ?Build-up your immune system: Take 1000 mg of Vitamin C with every meal (3 times a day) the day prior to your procedure. ?Antibiotics: Inform the staff if you have a condition or reason that requires you to take antibiotics before dental procedures. ?Pregnancy: If you are pregnant, call and cancel the procedure. ?Sickness: If  you have a cold, fever, or any active infections, call and cancel the procedure. ?Arrival: You must be in the facility at least 30 minutes prior to your scheduled procedure. ?Children: Do not bring children with you. ?Dress appropriately: Bring dark clothing that you would not mind if they get stained. ?Valuables: Do not bring any jewelry or valuables. ? ?Reasons to call and reschedule or cancel your procedure: (Following these recommendations will minimize the risk of a serious complication.) ?Surgeries: Avoid having procedures within 2 weeks of any surgery. (Avoid for 2 weeks before or after any surgery). ?Flu Shots: Avoid having procedures within 2 weeks of a flu shots. (Avoid for 2 weeks before or after immunizations). ?Barium: Avoid having a procedure within 7-10 days after having had a radiological study involving the use of radiological contrast. (Myelograms, Barium swallow or enema study). ?Heart attacks: Avoid any elective procedures or surgeries for the initial 6 months after a "Myocardial Infarction" (Heart Attack). ?Blood thinners: It is imperative that you stop these medications before procedures. Let us know if you if you take any blood thinner.  ?Infection: Avoid procedures during or within two weeks of an infection (including chest colds or gastrointestinal problems). Symptoms associated with infections include: Localized redness, fever, chills, night sweats or profuse sweating, burning sensation when voiding, cough, congestion, stuffiness, runny nose, sore throat, diarrhea, nausea, vomiting, cold or Flu symptoms, recent or current infections. It is specially important if the infection is over the area that we intend to treat. ?Heart and lung problems: Symptoms that may suggest an active cardiopulmonary problem include: cough, chest pain, breathing difficulties or shortness of breath, dizziness, ankle swelling, uncontrolled high or unusually low blood pressure, and/or palpitations. If you are  experiencing any of these symptoms, cancel your procedure and contact your primary care physician for an evaluation. ? ?Remember:  ?Regular Business hours are:  ?Monday to Thursday 8:00 AM to 4:00 PM ? ?Provider's Schedule: ?Milinda Pointer, MD:  ?Procedure days: Tuesday and Thursday 7:30 AM to 4:00 PM ? ?Gillis Santa, MD:  ?Procedure days: Monday and Wednesday 7:30 AM to 4:00 PM ?______________________________________________________________________ ? Sacroiliac (SI) Joint Injection ?Patient Information ? ?Description: The sacroiliac joint connects the scrum (very low back and tailbone) to the ilium (a pelvic bone which also forms half of the hip joint).  Normally this joint experiences very little motion.  When this joint becomes inflamed or unstable low back and or hip and pelvis pain may result.  Injection of this joint with local anesthetics (numbing medicines) and steroids can provide diagnostic information and reduce pain.  This injection is performed with the aid of x-ray guidance into the tailbone area while you are lying on your stomach. ?  You may experience an electrical sensation down the leg while this is being done.  You may also experience numbness.  We also may ask if we are reproducing your normal pain during the injection. ? ?Conditions which may be treated SI injection: ? ?Low back, buttock, hip or leg pain ? ?Preparation for the Injection: ? ?Do not eat any solid food or dairy products within 8 hours of your appointment.  ?You may drink clear liquids up to 3 hours before appointment.  Clear liquids include water, black coffee, juice or soda.  No milk or cream please. ?You may take your regular medications, including pain medications with a sip of water before your appointment.  Diabetics should hold regular insulin (if take separately) and take 1/2 normal NPH dose the morning of the procedure.  Carry some sugar containing items with you to your appointment. ?A driver must accompany you and be  prepared to drive you home after your procedure. ?Bring all of your current medications with you. ?An IV may be inserted and sedation may be given at the discretion of the physician. ?A blood pressure cuff, EKG and other monitors will often be applied during the procedure.  Some patients may need to have extra oxygen administered for a short period.  ?You will be asked to provide medical information, including your allergies, prior to the procedure.  We must know immediately if you are taking blood thinners (like Coumadin/Warfarin) or if you are allergic to IV iodine contrast (dye).  We must know if you could possible be pregnant. ? ?Possible side effects: ? ?Bleeding from needle site ?Infection (rare, may require surgery) ?Nerve injury (rare) ?Numbness & tingling (temporary) ?A brief convulsion or seizure ?Light-headedness (temporary) ?Pain at injection site (several days) ?Decreased blood pressure (temporary) ?Weakness in the leg (temporary) ? ? ?Call if you experience: ? ?New onset weakness or numbness of an extremity below the injection site that last more than 8 hours. ?Hives or difficulty breathing ( go to the emergency room) ?Inflammation or drainage at the injection site ?Any new symptoms which are concerning to you ? ?Please note: ? ?Although the local anesthetic injected can often make your back/ hip/ buttock/ leg feel good for several hours after the injections, the pain will likely return.  It takes 3-7 days for steroids to work in the sacroiliac area.  You may not notice any pain relief for at least that one week. ? ?If effective, we will often do a series of three injections spaced 3-6 weeks  apart to maximally decrease your pain.  After the initial series, we generally will wait some months before a repeat injection of the same type. ? ?If you have any questions, please call [phone] ?[facility] ? ? ?

## 2021-09-05 NOTE — Progress Notes (Signed)
Nursing Pain Medication Assessment:  ?Safety precautions to be maintained throughout the outpatient stay will include: orient to surroundings, keep bed in low position, maintain call bell within reach at all times, provide assistance with transfer out of bed and ambulation.  ?Medication Inspection Compliance: Ms. Agostinelli did not comply with our request to bring her pills to be counted. She was reminded that bringing the medication bottles, even when empty, is a requirement. ? ?Medication: None brought in. ?Pill/Patch Count: None available to be counted. ?Bottle Appearance: No container available. Did not bring bottle(s) to appointment. ?Filled Date: N/A ?Last Medication intake:  YesterdaySafety precautions to be maintained throughout the outpatient stay will include: orient to surroundings, keep bed in low position, maintain call bell within reach at all times, provide assistance with transfer out of bed and ambulation.  ?

## 2021-09-05 NOTE — Progress Notes (Signed)
PROVIDER NOTE: Information contained herein reflects review and annotations entered in association with encounter. Interpretation of such information and data should be left to medically-trained personnel. Information provided to patient can be located elsewhere in the medical record under "Patient Instructions". Document created using STT-dictation technology, any transcriptional errors that may result from process are unintentional.  ?  ?Patient: [name]  Service Category: E/M  Provider: Gillis Santa, MD  ?DOB: 04-01-1971  DOS: 09/05/2021  Specialty: Interventional Pain Management  ?MRN: [id number]  Setting: Ambulatory outpatient  PCP: Vidal Schwalbe, MD  ?Type: Established Patient    Referring Provider: Vidal Schwalbe, MD  ?Location: Office  Delivery: Face-to-face    ? ?HPI  ?[name], a 51 y.o. year old female, is here today because of her Sacroiliac joint pain [M53.3]. [name] primary complain today is Back Pain ? ?Last encounter: My last encounter with her was on 08/07/21 ? ?Pertinent problems: [name] has Lumbar radiculopathy; Lumbar degenerative disc disease; Lumbar spondylosis; Chronic low back pain with bilateral sciatica; Chronic pain syndrome; Lumbar sprain; Chronic migraine without aura without status migrainosus, not intractable; SI joint arthritis; and Long term prescription opiate use on their pertinent problem list. ?Pain Assessment: Severity of Chronic pain is reported as a 7 /10. Location: Back Right, Left/pain radiaties down left leg to her feet and right leg to thigh. Onset: More than a month ago. Quality: Aching, Constant, Throbbing. Timing: Constant. Modifying factor(s): meds and procedures. ?Vitals:  height is '5\' 1"'$  (1.549 m) and weight is 113 lb (51.3 kg). Her temperature is 97.7 ?F (36.5 ?C). Her blood pressure is 113/61 and her pulse is 64. Her oxygen saturation is 100%.  ? ?Reason for encounter: both, medication management and post-procedure assessment.    ? ? ?Post-procedure evaluation  ? ?Type: Therapeutic Sacroiliac Joint Steroid Injection #1 for 2023: 08/07/2021 ?Region: Inferior Lumbosacral Region ?Level: PIIS (Posterior Inferior Iliac Spine) ?Laterality: Bilateral ? ?Effectiveness:  ?Initial hour after procedure: 100 %  ?Subsequent 4-6 hours post-procedure: 100 %  ?Analgesia past initial 6 hours: 60 % (ongoing)  ?Ongoing improvement:  ?Analgesic:  60-70% ?Function: [name] reports improvement in function ?ROM: [name] reports improvement in ROM ? ? ?Pharmacotherapy Assessment  ?Analgesic: ? ?07/31/2021  06/01/2021   2  Oxycodone-Acetaminophen 5-325 75.00  30  [facility]  [id number]   Nor (6917)  0/0  18.75 MME  Comm Ins  [place]   ? ? ? ?Monitoring: ?[place] PMP: PDMP reviewed during this encounter.       ?Pharmacotherapy: No side-effects or adverse reactions reported. ?Compliance: No problems identified. ?Effectiveness: Clinically acceptable.  UDS:  ?Summary  ?Date Value Ref Range Status  ?08/24/2020 Note  Final  ?  Comment:  ?  ==================================================================== ?[employer] Select 13 (MW) ?==================================================================== ?Test                             Result       Flag       Units ? ?Drug Present and Declared for Prescription Verification ?  Oxazepam                       53           EXPECTED   ng/mg creat ?  Temazepam                      114  EXPECTED   ng/mg creat ?   Oxazepam and temazepam are expected metabolites of diazepam. ?   Oxazepam is also an expected metabolite of other benzodiazepine ?   drugs, including chlordiazepoxide, prazepam, clorazepate, halazepam, ?   and temazepam.  Oxazepam and temazepam are available as scheduled ?   prescription medications. ? ?  7-aminoclonazepam              318          EXPECTED   ng/mg creat ?   7-aminoclonazepam is an expected metabolite of clonazepam. Source of ?   clonazepam is a scheduled prescription medication. ? ?  Oxycodone                       7108         EXPECTED   ng/mg creat ?  Oxymorphone                    1567         EXPECTED   ng/mg creat ?  Noroxycodone                   3353         EXPECTED   ng/mg creat ?  Noroxymorphone                 227          EXPECTED   ng/mg creat ?   Sources of oxycodone are scheduled prescription medications. ?   Oxymorphone, noroxycodone, and noroxymorphone are expected ?   metabolites of oxycodone. Oxymorphone is also available as a ?   scheduled prescription medication. ? ?==================================================================== ?Test                      Result    Flag   Units      Ref Range ?  Creatinine              49               mg/dL      >=20 ?==================================================================== ?Declared Medications: ? The flagging and interpretation on this report are based on the ? following declared medications.  Unexpected results may arise from ? inaccuracies in the declared medications. ? ? **Note: The testing scope of this panel includes these medications: ? ? Clonazepam (Klonopin) ? Oxycodone (Percocet) ? Temazepam (Restoril) ? ? **Note: The testing scope of this panel does not include the ? following reported medications: ? ? Acetaminophen (Percocet) ? Fluticasone (Flonase) ? Metoprolol (Toprol) ? Ondansetron (Zofran) ? Prednisone (Deltasone) ?==================================================================== ?For clinical consultation, please call [phone]. ?==================================================================== ?  ?  ? ? ?ROS  ?Constitutional: Denies any fever or chills ?Gastrointestinal: No reported hemesis, hematochezia, vomiting, or acute GI distress ?Musculoskeletal:  Bilateral SI joint, buttock pain ?Neurological: No reported episodes of acute onset apraxia, aphasia, dysarthria, agnosia, amnesia, paralysis, loss of coordination, or loss of consciousness ? ?Medication Review  ?Aspirin-Caffeine, clonazePAM, dicyclomine, famotidine,  fluconazole, hydrOXYzine, linaclotide, metoprolol succinate, ondansetron, oxyCODONE-acetaminophen, pantoprazole, temazepam, and venlafaxine ? ?History Review  ?Allergy: Ms. Burdell is allergic to buprenorphine, penicillins, aripiprazole, seroquel [quetiapine], cefaclor, doxycycline, gabapentin, ibuprofen, levofloxacin, lisinopril, macrobid [nitrofurantoin monohyd macro], and naproxen. ?Drug: Ms. Rudden  reports no history of drug use. ?Alcohol:  reports no history of alcohol use. ?Tobacco:  reports that she has been smoking cigarettes. She has a 20.00 pack-year smoking history. She has never used smokeless tobacco. ?  Social: Ms. Noviello  reports that she has been smoking cigarettes. She has a 20.00 pack-year smoking history. She has never used smokeless tobacco. She reports that she does not drink alcohol and does not use drugs. ?Medical:  has a past medical history of Chronic back pain, Colitis, Hypertension, Migraine, Plumbism, PONV (postoperative nausea and vomiting), Renal mass, and Uterine cancer ([place]). ?Surgical: Ms. Carton  has a past surgical history that includes Abdominal hysterectomy; Cholecystectomy; Hand surgery; Back surgery; Colonoscopy with esophagogastroduodenoscopy (egd) (07/2009); Esophagogastroduodenoscopy (06/2007); Colonoscopy with propofol (N/A, 07/07/2021); Esophagogastroduodenoscopy (egd) with propofol (N/A, 07/07/2021); biopsy (07/07/2021); and Esophageal brushing (07/07/2021). ?Family: family history includes Cancer in an other family member; Diabetes in an other family member; Seizures in an other family member; Stroke in an other family member. ? ?Laboratory Chemistry Profile  ? ?Renal ?Lab Results  ?Component Value Date  ? BUN 11 11/14/2020  ? CREATININE 0.75 11/14/2020  ? GFRAA >60 01/17/2020  ? GFRNONAA >60 11/14/2020  ? ?  Hepatic ?Lab Results  ?Component Value Date  ? AST 29 11/14/2020  ? ALT 25 11/14/2020  ? ALBUMIN 3.9 11/14/2020  ? ALKPHOS 108 11/14/2020  ? LIPASE 25 11/14/2020  ? ?   ?Electrolytes ?Lab Results  ?Component Value Date  ? NA 139 11/14/2020  ? K 3.7 11/14/2020  ? CL 104 11/14/2020  ? CALCIUM 9.3 11/14/2020  ? ?  Bone ?No results found for: [place], [id number], [id number], [id number], 25OHV

## 2021-09-06 ENCOUNTER — Telehealth: Payer: Self-pay

## 2021-09-06 NOTE — Telephone Encounter (Signed)
Her insurance requires 3 months in between [place]. I called the patient and let her know. I will resubmit in June  ?

## 2021-09-11 ENCOUNTER — Other Ambulatory Visit: Payer: Self-pay

## 2021-09-11 ENCOUNTER — Encounter: Payer: Self-pay | Admitting: Student in an Organized Health Care Education/Training Program

## 2021-09-11 ENCOUNTER — Telehealth: Payer: Self-pay

## 2021-09-11 LAB — TOXASSURE SELECT 13 (MW), URINE

## 2021-09-11 NOTE — Progress Notes (Signed)
Patient's urine toxicology screen that is routinely done to assess medication compliance monitoring was inappropriately positive for hydrocodone.  I only prescribe the patient oxycodone.  Her PMP was checked and there are no prescriptions for hydrocodone.  This is a clear violation of her pain contract with me.  I will be discontinuing her oxycodone and I recommend that she focus on nonopioid analgesics for pain management with PCP.

## 2021-09-11 NOTE — Telephone Encounter (Signed)
[facility] notified to cancel any remaining oxycodone prescriptions that they have on file.  Spoke with [employer].  ?

## 2021-09-12 ENCOUNTER — Ambulatory Visit: Payer: BC Managed Care – PPO | Admitting: Gastroenterology

## 2021-09-19 ENCOUNTER — Encounter: Payer: Self-pay | Admitting: Internal Medicine

## 2021-09-19 ENCOUNTER — Ambulatory Visit: Payer: BC Managed Care – PPO | Admitting: Gastroenterology

## 2021-09-19 NOTE — Progress Notes (Deleted)
GI Office Note    Referring Provider: Vidal Schwalbe, MD Primary Care Physician:  Vidal Schwalbe, MD  Primary Gastroenterologist:  Chief Complaint   No chief complaint on file.   History of Present Illness   [name] is a 51 y.o. female presenting today     EGD      Medications   Current Outpatient Medications  Medication Sig Dispense Refill   Aspirin-Caffeine (BC FAST PAIN RELIEF PO) Take 1 packet by mouth every 6 (six) hours as needed (pain).     clonazePAM (KLONOPIN) 0.5 MG tablet Take 0.5 mg by mouth 3 (three) times daily as needed for anxiety.     dicyclomine (BENTYL) 20 MG tablet TAKE (1) TABLET BY MOUTH THREE TIMES DAILY WITH FOOD.HOLD FOR CONSTIPATION 90 tablet 3   famotidine (PEPCID) 20 MG tablet Take 1 tablet (20 mg total) by mouth 2 (two) times daily. 60 tablet 1   fluconazole (DIFLUCAN) 200 MG tablet Take 2 tablets on day 1 then 1 tablet daily thereafter for a total of 14 days. (Patient not taking: Reported on 09/05/2021) 15 tablet 0   hydrOXYzine (ATARAX/VISTARIL) 50 MG tablet Take 50 mg by mouth every 6 (six) hours as needed for anxiety.     linaclotide (LINZESS) 145 MCG CAPS capsule Take 1 capsule (145 mcg total) by mouth daily before breakfast. 30 capsule 5   metoprolol succinate (TOPROL-XL) 100 MG 24 hr tablet Take 100 mg by mouth daily. Take with or immediately following a meal.     ondansetron (ZOFRAN-ODT) 4 MG disintegrating tablet DISSOLVE 1 OR 2 TABLETS ON THE TONGUE EVERY 8 HOURS AS NEEDED (no more than 4 per day) 120 tablet 1   oxyCODONE-acetaminophen (PERCOCET) 5-325 MG tablet Take 1 tablet by mouth every 8 (eight) hours as needed for severe pain. Must last 30 days. 75 tablet 0   [START ON 10/05/2021] oxyCODONE-acetaminophen (PERCOCET) 5-325 MG tablet Take 1 tablet by mouth every 8 (eight) hours as needed for severe pain. Must last 30 days. 75 tablet 0   [START ON 11/04/2021] oxyCODONE-acetaminophen (PERCOCET) 5-325 MG tablet Take 1 tablet by  mouth every 8 (eight) hours as needed for severe pain. Must last 30 days. 75 tablet 0   pantoprazole (PROTONIX) 40 MG tablet Take 40 mg by mouth daily.     temazepam (RESTORIL) 30 MG capsule Take 30 mg by mouth at bedtime as needed for sleep.     venlafaxine (EFFEXOR) 75 MG tablet Take 75 mg by mouth daily.     No current facility-administered medications for this visit.    Allergies   Allergies as of 09/19/2021 - Review Complete 09/05/2021  Allergen Reaction Noted   Buprenorphine Other (See Comments) 01/24/2016   Penicillins Swelling and Rash 07/08/2011   Aripiprazole Other (See Comments) 08/01/2020   Seroquel [quetiapine] Other (See Comments) 08/18/2020   Cefaclor Other (See Comments)    Doxycycline Rash 08/01/2020   Gabapentin Nausea And Vomiting 01/24/2016   Ibuprofen Swelling and Rash 07/08/2011   Levofloxacin Rash 01/24/2016   Lisinopril Nausea And Vomiting 08/01/2020   Macrobid [nitrofurantoin monohyd macro] Rash 11/30/2014   Naproxen Swelling and Rash 07/08/2011     Past Medical History   Past Medical History:  Diagnosis Date   Chronic back pain    Colitis    Hypertension    Migraine    Plumbism    blood clot    PONV (postoperative nausea and vomiting)    Renal mass  Uterine cancer [facility])    age 65    Past Surgical History   Past Surgical History:  Procedure Laterality Date   ABDOMINAL HYSTERECTOMY     BACK SURGERY     BIOPSY  07/07/2021   Procedure: BIOPSY;  Surgeon: Eloise Harman, DO;  [facility]: [facility];  Service: Endoscopy;;   CHOLECYSTECTOMY     Complicated by bile leak requiring ERCP with temporary stenting   COLONOSCOPY WITH ESOPHAGOGASTRODUODENOSCOPY (EGD)  07/2009   [facility]: Op notes cannot be seen through care everywhere however pathology showed terminal ileum and random colon biopsies normal.  Stomach biopsy with gastric antral and fundic mucosa with reactive foveolar hyperplasia.  No active gastritis.  Stains negative for H. pylori.    COLONOSCOPY WITH PROPOFOL N/A 07/07/2021   Procedure: COLONOSCOPY WITH PROPOFOL;  Surgeon: Eloise Harman, DO;  Location: [facility];  Service: Endoscopy;  Laterality: N/A;  12:30pm   ESOPHAGEAL BRUSHING  07/07/2021   Procedure: ESOPHAGEAL BRUSHING;  Surgeon: Eloise Harman, DO;  Location: [facility];  Service: Endoscopy;;   ESOPHAGOGASTRODUODENOSCOPY  06/2007   [facility]: Normal esophagus, gastric mucosal abnormality with erythema, biopsy showed gastric antral and fundic mucosa with reactive foveolar hyperplasia, no gastritis, no H. pylori.   ESOPHAGOGASTRODUODENOSCOPY (EGD) WITH PROPOFOL N/A 07/07/2021   Procedure: ESOPHAGOGASTRODUODENOSCOPY (EGD) WITH PROPOFOL;  Surgeon: Eloise Harman, DO;  [facility];  Service: Endoscopy;  Laterality: N/A;   HAND SURGERY      Past Family History   Family History  Problem Relation Age of Onset   Cancer Other    Seizures Other    Stroke Other    Diabetes Other     Past Social History   Social History   Socioeconomic History   Marital status: Married    Spouse name: benito   Number of children: 2   Years of education: Not on file   Highest education level: Associate degree: occupational, Hotel manager, or vocational program  Occupational History    Comment: not employed  Tobacco Use   Smoking status: Every Day    Packs/day: 1.00    Years: 20.00    Pack years: 20.00    Types: Cigarettes   Smokeless tobacco: Never  Vaping Use   Vaping Use: Never used  Substance and Sexual Activity   Alcohol use: No   Drug use: No   Sexual activity: Yes    Birth control/protection: Surgical  Other Topics Concern   Not on file  Social History Narrative   Not on file   Social Determinants of Health   Financial Resource Strain: Not on file  Food Insecurity: Not on file  Transportation Needs: Not on file  Physical Activity: Not on file  Stress: Not on file  Social Connections: Not on file  Intimate Partner Violence: Not on  file    Review of Systems   General: Negative for anorexia, weight loss, fever, chills, fatigue, weakness. ENT: Negative for hoarseness, difficulty swallowing , nasal congestion. CV: Negative for chest pain, angina, palpitations, dyspnea on exertion, peripheral edema.  Respiratory: Negative for dyspnea at rest, dyspnea on exertion, cough, sputum, wheezing.  GI: See history of present illness. GU:  Negative for dysuria, hematuria, urinary incontinence, urinary frequency, nocturnal urination.  Endo: Negative for unusual weight change.     Physical Exam   There were no vitals taken for this visit.   General: Well-nourished, well-developed in no acute distress.  Eyes: No icterus. Mouth: Oropharyngeal mucosa  moist and pink , no lesions erythema or exudate. Lungs: Clear to auscultation bilaterally.  Heart: Regular rate and rhythm, no murmurs rubs or gallops.  Abdomen: Bowel sounds are normal, nontender, nondistended, no hepatosplenomegaly or masses,  no abdominal bruits or hernia , no rebound or guarding.  Rectal: ***  Extremities: No lower extremity edema. No clubbing or deformities. Neuro: Alert and oriented x 4   Skin: Warm and dry, no jaundice.   Psych: Alert and cooperative, normal mood and affect.  Labs   *** Imaging Studies   No results found.  Assessment       PLAN   ***   Laureen Ochs. Bobby Rumpf, [place], [facility]

## 2021-09-23 ENCOUNTER — Other Ambulatory Visit: Payer: Self-pay | Admitting: Gastroenterology

## 2021-09-25 ENCOUNTER — Other Ambulatory Visit: Payer: Self-pay | Admitting: Gastroenterology

## 2021-10-10 ENCOUNTER — Other Ambulatory Visit: Payer: Self-pay | Admitting: Gastroenterology

## 2021-10-16 ENCOUNTER — Ambulatory Visit: Payer: BC Managed Care – PPO | Admitting: Gastroenterology

## 2021-10-23 ENCOUNTER — Other Ambulatory Visit: Payer: Self-pay | Admitting: Gastroenterology

## 2021-10-23 NOTE — Telephone Encounter (Signed)
Pt wants refill on this medication: It was ordered by Magda Paganini on 10-10-2021, last refill 10/23/2021 and this pt has not had ov since 04/11/2021. It is stating possible duplicate. Please advise

## 2021-10-23 NOTE — Telephone Encounter (Signed)
Noted, pharmacy aware.

## 2021-10-27 ENCOUNTER — Telehealth: Payer: Self-pay

## 2021-10-28 ENCOUNTER — Other Ambulatory Visit: Payer: Self-pay | Admitting: Gastroenterology

## 2021-10-30 ENCOUNTER — Encounter: Payer: Self-pay | Admitting: Internal Medicine

## 2021-10-30 NOTE — Telephone Encounter (Signed)
I have refilled once. Needs office visit as it has been awhile. Last seen by Magda Paganini.

## 2021-10-30 NOTE — Telephone Encounter (Signed)
Anna Pt requesting another refill on Ondansetron ODT. You just filled it on 10-23-2021 .Her last ov was 04-11-2021 with Magda Paganini

## 2021-10-30 NOTE — Telephone Encounter (Signed)
Noted   [name]  Pt needs ov per Roseanne Kaufman and she was last seen by Magda Paganini    thanks

## 2021-11-02 ENCOUNTER — Other Ambulatory Visit: Payer: Self-pay | Admitting: Gastroenterology

## 2021-11-06 NOTE — Progress Notes (Signed)
Referring Provider: Vidal Schwalbe, MD Primary Care Physician:  Vidal Schwalbe, MD Primary GI Physician: Dr. Abbey Chatters  Chief Complaint  Patient presents with   Follow-up    Refills on meds and follow up after procedure    HPI:   [name] is a 51 y.o. female presenting today for follow-up.  She previously reported history of colitis, followed by [facility] (but not seen since 2014).  Previously reported she was treated with prednisone but never any long-term oral medications.  She thought she had history of UC and had been managing her symptoms with Phenergan and Librax.  She was evaluated in the ED at [facility] in April 2022 for lower abdominal pain, diarrhea, vomiting.  CT with contrast showed wall thickening involving much of the colon with skip lesions in the ascending, transverse, descending colon with more uniform wall thickening throughout the sigmoid colon and upper rectum, also with wall thickening of the TI-cecum junction, concerning for Crohn's.  Also had wall thickening in portions of the distal stomach.  Also with biliary duct dilation without obstructing lesion.  She was started on prednisone at that time. Had follow-up CT in the ED in July 2022 with no bowel wall thickening, stomach unremarkable.   Seen in our office in November 2022. She was out of Entocort stating she tapered herself off. Entocort had been prescribed by PCP. She was taking Zofran on regular basis and bentyl 10 mg TID, but felt she did better on 20 mg dosing. However, she was dealing with constipation with Bms every 2-3 days. Requesting phenergan suppositories to use for intractable vomiting to try and avoid ED visit. Complains of some burning in upper stomach taking pepcid and pantoprazole. Nausea is her most worrisome symptoms. Has it every day. Vomits frequently. BM every 2-3 days. Often hard to go. No melena, brbpr. She was scheduled for EGD and colonoscopy, started on Linzess 145 mcg for constipation,  continued on Zofran provided limited supply of Phenergan dicyclomine increased to 20 mg 3 times daily for abdominal cramping.  Records requested from [facility] for reported prior admissions.  Procedures completed 07/07/21: Colonoscopy: Non-bleeding internal hemorrhoids, normal TI  biopsied, normal colon biopsied. Pathology with colon and TI biopsies normal.  EGD: Moderately severe candidiasis esophagitis with no bleeding. Cells for cytology obtained. Gastritis biopsied, normal examined duodenum.  Recommended PPI BID.  Stomach biopsy with moderate reactive gastropathy, negative for H pylori. KOH prep was positive.  She was prescribed Diflucan x14 days.    Today:  Continues to have daily nausea, taking Zofran up to 3 times a day.  States the office, she would vomit.  Nausea has been present for several years.  She still taking pantoprazole once daily and is having daily reflux symptoms.  Also taking Pepcid.  Denies NSAIDs.  Admits to some early satiety.  She has lost about 5 pounds in the last 7 months.  Eats twice a day.  Usually drinks 1 protein shake daily.  She is still taking Bentyl 20 mg up to 3 times a day to prevent abdominal cramping with meals.  States the cramping starts in the epigastric area, but will move down to her lower abdomen stating it, "moves with the food".  When taking Bentyl, she has no abdominal pain.  Usually, bowels are moving daily as long as she is eating well.  She is not having any hard stools that require straining or diarrhea.  She does take Linzess every couple of days.  No BRBPR or melena.  Patient reports all of the symptoms have been present for 20 years or so.  Feels like everything started up after having her gallbladder taken out in 2003.  Oxycodone was discontinued about 2 months ago. Getting spinal injections with pain management.   No NSAIDs.   Past Medical History:  Diagnosis Date   Chronic back pain    Colitis    Hypertension    Migraine     Plumbism    blood clot    PONV (postoperative nausea and vomiting)    Renal mass    Uterine cancer [facility])    age 66    Past Surgical History:  Procedure Laterality Date   ABDOMINAL HYSTERECTOMY     BACK SURGERY     BIOPSY  07/07/2021   Procedure: BIOPSY;  Surgeon: Eloise Harman, DO;  Location: [facility];  Service: Endoscopy;;   CHOLECYSTECTOMY     Complicated by bile leak requiring ERCP with temporary stenting   COLONOSCOPY WITH ESOPHAGOGASTRODUODENOSCOPY (EGD)  07/2009   [facility]: Op notes cannot be seen through care everywhere however pathology showed terminal ileum and random colon biopsies normal.  Stomach biopsy with gastric antral and fundic mucosa with reactive foveolar hyperplasia.  No active gastritis.  Stains negative for H. pylori.   COLONOSCOPY WITH PROPOFOL N/A 07/07/2021   Surgeon: Hurshel Keys K, DO;  Non-bleeding internal hemorrhoids, normal TI  biopsied, normal colon biopsied. Pathology with colon and TI biopsies normal.   ESOPHAGEAL BRUSHING  07/07/2021   Procedure: ESOPHAGEAL BRUSHING;  Surgeon: Eloise Harman, DO;  Location: [facility];  Service: Endoscopy;;   ESOPHAGOGASTRODUODENOSCOPY  06/2007   [facility]: Normal esophagus, gastric mucosal abnormality with erythema, biopsy showed gastric antral and fundic mucosa with reactive foveolar hyperplasia, no gastritis, no H. pylori.   ESOPHAGOGASTRODUODENOSCOPY (EGD) WITH PROPOFOL N/A 07/07/2021   Surgeon: Eloise Harman, DO; moderately severe Candida esophagitis, gastritis, biopsies negative for H. pylori, normal examined duodenum.  Prescribed Diflucan and recommended PPI twice daily.   HAND SURGERY      Current Outpatient Medications  Medication Sig Dispense Refill   Aspirin-Caffeine (BC FAST PAIN RELIEF PO) Take 1 packet by mouth every 6 (six) hours as needed (pain).     clonazePAM (KLONOPIN) 0.5 MG tablet Take 0.5 mg by mouth 3 (three) times daily as needed for anxiety.     famotidine (PEPCID) 20 MG  tablet Take 1 tablet (20 mg total) by mouth 2 (two) times daily. 60 tablet 1   hydrOXYzine (ATARAX/VISTARIL) 50 MG tablet Take 50 mg by mouth every 6 (six) hours as needed for anxiety.     metoprolol succinate (TOPROL-XL) 100 MG 24 hr tablet Take 100 mg by mouth daily. Take with or immediately following a meal.     temazepam (RESTORIL) 30 MG capsule Take 30 mg by mouth at bedtime as needed for sleep.     venlafaxine (EFFEXOR) 75 MG tablet Take 75 mg by mouth daily.     dicyclomine (BENTYL) 20 MG tablet TAKE (1) TABLET BY MOUTH THREE TIMES DAILY WITH FOOD.HOLD FOR CONSTIPATION 90 tablet 3   linaclotide (LINZESS) 145 MCG CAPS capsule Take 1 capsule (145 mcg total) by mouth daily before breakfast. 30 capsule 3   ondansetron (ZOFRAN-ODT) 4 MG disintegrating tablet DISSOLVE 1 OR 2 TABLETS ON THE TONGUE EVERY 8 HOURS AS NEEDED 60 tablet 5   pantoprazole (PROTONIX) 40 MG tablet Take 1 tablet (40 mg total) by mouth 2 (two)  times daily before a meal. 60 tablet 3   No current facility-administered medications for this visit.    Allergies as of 11/08/2021 - Review Complete 11/08/2021  Allergen Reaction Noted   Buprenorphine Other (See Comments) 01/24/2016   Penicillins Swelling and Rash 07/08/2011   Aripiprazole Other (See Comments) 08/01/2020   Seroquel [quetiapine] Other (See Comments) 08/18/2020   Cefaclor Other (See Comments)    Doxycycline Rash 08/01/2020   Gabapentin Nausea And Vomiting 01/24/2016   Ibuprofen Swelling and Rash 07/08/2011   Levofloxacin Rash 01/24/2016   Lisinopril Nausea And Vomiting 08/01/2020   Macrobid [nitrofurantoin monohyd macro] Rash 11/30/2014   Naproxen Swelling and Rash 07/08/2011    Family History  Problem Relation Age of Onset   Cancer Other    Seizures Other    Stroke Other    Diabetes Other     Social History   Socioeconomic History   Marital status: Married    Spouse name: benito   Number of children: 2   Years of education: Not on file   Highest  education level: Associate degree: occupational, Hotel manager, or vocational program  Occupational History    Comment: not employed  Tobacco Use   Smoking status: Every Day    Packs/day: 1.00    Years: 20.00    Total pack years: 20.00    Types: Cigarettes, E-cigarettes   Smokeless tobacco: Never  Vaping Use   Vaping Use: Never used  Substance and Sexual Activity   Alcohol use: No   Drug use: No   Sexual activity: Yes    Birth control/protection: Surgical  Other Topics Concern   Not on file  Social History Narrative   Not on file   Social Determinants of Health   Financial Resource Strain: Low Risk  (03/19/2017)   Overall Financial Resource Strain (CARDIA)    Difficulty of Paying Living Expenses: Not very hard  Food Insecurity: No Food Insecurity (03/19/2017)   Hunger Vital Sign    Worried About Running Out of Food in the Last Year: Never true    Ran Out of Food in the Last Year: Never true  Transportation Needs: No Transportation Needs (03/19/2017)   PRAPARE - Hydrologist (Medical): No    Lack of Transportation (Non-Medical): No  Physical Activity: Inactive (03/19/2017)   Exercise Vital Sign    Days of Exercise per Week: 0 days    Minutes of Exercise per Session: 0 min  Stress: Stress Concern Present (03/19/2017)   [place]    Feeling of Stress : To some extent  Social Connections: Somewhat Isolated (03/19/2017)   Social Connection and Isolation Panel [NHANES]    Frequency of Communication with Friends and Family: More than three times a week    Frequency of Social Gatherings with Friends and Family: Twice a week    Attends Religious Services: Never    Marine scientist or Organizations: No    Attends Music therapist: Never    Marital Status: Married    Review of Systems: Gen: Denies fever, chills, cold or flu like symptoms, pre-syncope, or syncope.  CV:  Denies chest pain, palpitations. Resp: Denies dyspnea, cough.  GI: See HPI Heme: See HPI  Physical Exam: BP 118/60   Pulse 77   Temp 97.9 F (36.6 C)   Ht '5\' 1"'$  (1.549 m)   Wt 111 lb (50.3 kg)   BMI 20.97 kg/m  General:  Alert and oriented. No distress noted. Pleasant and cooperative.  Head:  Normocephalic and atraumatic. Eyes:  Conjuctiva clear without scleral icterus. Heart:  S1, S2 present without murmurs appreciated. Lungs:  Clear to auscultation bilaterally. No wheezes, rales, or rhonchi. No distress.  Abdomen:  +BS, soft, non-tender and non-distended. No rebound or guarding. No HSM or masses noted. Msk:  Symmetrical without gross deformities. Normal posture. Extremities:  Without edema. Neurologic:  Alert and  oriented x4 Psych:  Normal mood and affect.    Assessment:  51 year old female with history of chronic nausea with vomiting, GERD, chronic abdominal pain, presenting today for follow-up s/p EGD and colonoscopy.  Nausea/vomiting: Present for years.  Etiology not entirely clear, but may be related to uncontrolled GERD.  Unable to rule out delayed gastric emptying/gastroparesis as she admits to mild early satiety.  Had previously been on oxycodone which would certainly contribute to this, but oxycodone was discontinued 2 months ago.  She is taking 20 mg of Bentyl up to 3 times a day which may also be contributing.  She is currently controlling her symptoms with Zofran daily.  Recent EGD with moderately severe Candida esophagitis treated with Diflucan, gastritis with biopsies negative for H. pylori, and normal examined duodenum.  We will plan to increase her PPI to twice daily.  Discussed pursuing gastric emptying study, but it was ultimately decided to see how she responds to increasing PPI.  GERD: Chronic.  Uncontrolled on Protonix 40 mg daily and Pepcid daily.  Recent EGD in February with Candida esophagitis, gastritis with biopsies negative for H. pylori.  She was  instructed to increase PPI to twice a day, but has not done this yet, so we will do this now and see how she responds.  Chronic abdominal pain: Chronic postprandial abdominal pain starting in the epigastric region then moving down through her abdomen to her lower abdomen.  Patient states it, "moves with the food".  Patient reports symptoms have been present for close to 20 years and started after having gallbladder removed.  Currently controlling symptoms with dicyclomine 20 mg up to 3 times a day and she is pleased with this.  She did have an episode of colitis in April 2022 and had previously reported a history of colitis, possibly ulcerative colitis followed by [facility], but never on any chronic medications.  She was empirically treated with prednisone and never completed stool studies.  CT in July with no bowel wall thickening, and recent colonoscopy in February with normal-appearing colon and TI s/p normal biopsies. LFTs previously normal and MIR abdomen without any biliary abnormalities. Recent EGD with esophageal candidiasis and gastritis.  At this point, the etiology of her abdominal pain is not entirely clear and likely multifactorial with differentials including uncontrolled GERD, gastritis, functional abdominal pain, bowel hypersensitivity, possibly IBS her pain does not seem to be influenced much by bowel habits.  She could also have a component of gastroparesis in the setting of chronic nausea and early satiety. I do note a 5 lb weight loss over the last 7 months which may be secondary to her chronic nausea, but if weight loss continues, would need to consider evaluating for chronic mesenteric ischemia. At this point, patient would like to continue with medical management.   Colitis: Episode of CT documented colitis in April 2022.  Previously reported history of colitis, possibly ulcerative colitis followed by [facility], but never on any chronic medications.  Per pathology on colonoscopy in 2011 at [facility],  colon biopsies were normal.  Nonetheless, she was empirically treated with prednisone in April for suspected IBD and never completed stool studies.  CT in July with no bowel wall thickening and recent colonoscopy in February with normal-appearing colon and TI s/p normal biopsies. At this point, the etiology of her prior colitis is unclear, but it doesn't seem that this is the etiology of her chronic abdominal pain considering recent normal colonoscopy but ongoing symptoms unless taking dicyclomine.  Constipation: Previously reported mild constipation.  This was likely influenced by chronic pain medications which have not been discontinued.  She is currently having bowel movements most every day, but does take Linzess 145 mcg as needed, every few days and would like to continue this.    Plan:  Increase Protonix to 40 mg twice daily.  New prescription sent to pharmacy. Counseled on GERD diet/lifestyle.  Separate instructions provided. Continue Zofran up to 3 times daily before meals.  Prescription sent to pharmacy. Continue Bentyl 20 mg up to 3 times daily before meals to prevent abdominal cramping.  Hold the setting of constipation. Prescription sent to pharmacy. 4-6 small meals daily. 2 protein shakes daily. Follow-up in 3 months or sooner if needed.   Aliene Altes, PA-C [facility] 11/08/2021

## 2021-11-07 ENCOUNTER — Ambulatory Visit: Payer: BC Managed Care – PPO | Admitting: Gastroenterology

## 2021-11-08 ENCOUNTER — Encounter: Payer: Self-pay | Admitting: Gastroenterology

## 2021-11-08 ENCOUNTER — Ambulatory Visit (INDEPENDENT_AMBULATORY_CARE_PROVIDER_SITE_OTHER): Payer: BC Managed Care – PPO | Admitting: Gastroenterology

## 2021-11-08 VITALS — BP 118/60 | HR 77 | Temp 97.9°F | Ht 61.0 in | Wt 111.0 lb

## 2021-11-08 DIAGNOSIS — G8929 Other chronic pain: Secondary | ICD-10-CM

## 2021-11-08 DIAGNOSIS — R112 Nausea with vomiting, unspecified: Secondary | ICD-10-CM | POA: Diagnosis not present

## 2021-11-08 DIAGNOSIS — R1013 Epigastric pain: Secondary | ICD-10-CM | POA: Diagnosis not present

## 2021-11-08 DIAGNOSIS — K219 Gastro-esophageal reflux disease without esophagitis: Secondary | ICD-10-CM | POA: Diagnosis not present

## 2021-11-08 DIAGNOSIS — K5900 Constipation, unspecified: Secondary | ICD-10-CM | POA: Diagnosis not present

## 2021-11-08 DIAGNOSIS — K529 Noninfective gastroenteritis and colitis, unspecified: Secondary | ICD-10-CM

## 2021-11-08 DIAGNOSIS — R634 Abnormal weight loss: Secondary | ICD-10-CM

## 2021-11-08 MED ORDER — LINACLOTIDE 145 MCG PO CAPS: 145.0000 ug | ORAL_CAPSULE | Freq: Every day | ORAL | 3 refills | Status: DC

## 2021-11-08 MED ORDER — PANTOPRAZOLE SODIUM 40 MG PO TBEC: 40.0000 mg | DELAYED_RELEASE_TABLET | Freq: Two times a day (BID) | ORAL | 3 refills | Status: DC

## 2021-11-08 MED ORDER — ONDANSETRON 4 MG PO TBDP: ORAL_TABLET | ORAL | 5 refills | Status: DC

## 2021-11-08 MED ORDER — DICYCLOMINE HCL 20 MG PO TABS: ORAL_TABLET | ORAL | 3 refills | Status: DC

## 2021-11-08 NOTE — Patient Instructions (Addendum)
Increase Protonix to 40 mg twice daily 30 minutes before breakfast and dinner.  I have sent a new prescription to your pharmacy.  Follow a GERD diet:  Avoid fried, fatty, greasy, spicy, citrus foods. Avoid caffeine and carbonated beverages. Avoid chocolate. Try eating 4-6 small meals a day rather than 3 large meals. Do not eat within 3 hours of laying down. Prop head of bed up on wood or bricks to create a 6 inch incline.  Continue to use Zofran up to 3 times daily before meals.   Continue taking Bentyl 20 mg up to 3 times daily before meals to prevent abdominal cramping.  Continue Linzess 145 mcg as needed.  Try to eat 4-6 small meals daily rather than 2 main meals.  Also recommend drinking 2 protein shakes daily.  We will plan to follow-up with you in about 3 months.  Do not hesitate to call sooner if you have any questions or concerns.  It was very nice meeting you today!  Aliene Altes, PA-C [facility]

## 2021-11-28 ENCOUNTER — Encounter: Payer: BC Managed Care – PPO | Admitting: Student in an Organized Health Care Education/Training Program

## 2022-01-22 ENCOUNTER — Telehealth: Payer: Self-pay | Admitting: *Deleted

## 2022-01-22 ENCOUNTER — Other Ambulatory Visit: Payer: Self-pay | Admitting: Gastroenterology

## 2022-01-22 DIAGNOSIS — G8929 Other chronic pain: Secondary | ICD-10-CM

## 2022-01-22 DIAGNOSIS — R112 Nausea with vomiting, unspecified: Secondary | ICD-10-CM

## 2022-01-22 MED ORDER — DICYCLOMINE HCL 20 MG PO TABS: ORAL_TABLET | ORAL | 3 refills | Status: DC

## 2022-01-22 MED ORDER — ONDANSETRON 4 MG PO TBDP: ORAL_TABLET | ORAL | 3 refills | Status: DC

## 2022-01-22 NOTE — Telephone Encounter (Signed)
Pt called and states she is out of Zofan and Bentyl would like a prescription sent to [facility] in [place].

## 2022-01-22 NOTE — Telephone Encounter (Signed)
Noted  

## 2022-01-22 NOTE — Telephone Encounter (Signed)
Rxs sent

## 2022-01-29 ENCOUNTER — Other Ambulatory Visit: Payer: Self-pay | Admitting: Emergency Medicine

## 2022-01-29 ENCOUNTER — Other Ambulatory Visit (HOSPITAL_COMMUNITY): Payer: Self-pay | Admitting: Emergency Medicine

## 2022-01-29 DIAGNOSIS — N281 Cyst of kidney, acquired: Secondary | ICD-10-CM

## 2022-02-14 NOTE — Progress Notes (Deleted)
Referring Provider: Vidal Schwalbe, MD Primary Care Physician:  Vidal Schwalbe, MD Primary GI Physician: Dr. Abbey Chatters  No chief complaint on file.   HPI:   [name] is a 51 y.o. female presenting today for follow-up.   History of GERD, candida esophagitis in February 2023, chronic nausea, constipation, colitis on CT in April 2022 and treated empirically for IBS/suspected Crohn's with prednisone and Entocort. Follow-up colonoscopy February 2023 (off all steroids for several months)  with nonbleeding internal hemorrhoids, normal TI biopsied and normal colon biopsied.  Pathology was unremarkable. EGD in February with Candida esophagitis, gastritis.  She was treated with fluconazole.  Interestingly, patient previously reported history of colitis followed by [facility] several years ago treated with prednisone but never any long-term medications.    Last seen in our office 11/08/2021.  She continued with daily nausea taking Zofran up to 3 times a day.  This has been present for years.  Also with reflux symptoms daily on pantoprazole once daily and Pepcid.  Some early satiety.  Down 5 pounds in the last 7 months.  Also taking dicyclomine 20 mg up to 3 times a day to prevent abdominal cramping with meals.  Reported cramping started in the epigastric area, but would move down to her lower abdomen, "moving with food".  With Bentyl, she had no pain.  Previously LFTs normal and MRI abdomen without biliary abnormalities.  Bowels moving daily as long as she eats well.  Also taking Linzess every couple of days.  No BRBPR or melena.  She reported all of her GI symptoms were chronic for the last 20 years or so and felt like it started up after having her gallbladder removed in 2003.  Denied NSAIDs.  Oxycodone has been discontinued 2 months ago.  Recommended increasing PPI to twice daily.  Could consider gastric emptying study in the future if nausea/early satiety continued despite better management of GERD.   Etiology of her chronic abdominal pain was not clear.  Suspected multifactorial component in the setting of GERD, gastritis, functional abdominal pain, bowel hypersensitivity, possible IBS, possible gastroparesis, and unable to rule out chronic mesenteric ischemia.  Patient preferred to continue medical management for now. Planned to follow-up in 3 months.     Past Medical History:  Diagnosis Date   Chronic back pain    Colitis    Hypertension    Migraine    Plumbism    blood clot    PONV (postoperative nausea and vomiting)    Renal mass    Uterine cancer [facility])    age 13    Past Surgical History:  Procedure Laterality Date   ABDOMINAL HYSTERECTOMY     BACK SURGERY     BIOPSY  07/07/2021   Procedure: BIOPSY;  Surgeon: Eloise Harman, DO;  [facility];  Service: Endoscopy;;   CHOLECYSTECTOMY     Complicated by bile leak requiring ERCP with temporary stenting   COLONOSCOPY WITH ESOPHAGOGASTRODUODENOSCOPY (EGD)  07/2009   [facility]: Op notes cannot be seen through care everywhere however pathology showed terminal ileum and random colon biopsies normal.  Stomach biopsy with gastric antral and fundic mucosa with reactive foveolar hyperplasia.  No active gastritis.  Stains negative for H. pylori.   COLONOSCOPY WITH PROPOFOL N/A 07/07/2021   Surgeon: Hurshel Keys K, DO;  Non-bleeding internal hemorrhoids, normal TI  biopsied, normal colon biopsied. Pathology with colon and TI biopsies normal.   ESOPHAGEAL BRUSHING  07/07/2021   Procedure: ESOPHAGEAL  BRUSHING;  Surgeon: Eloise Harman, DO;  [facility];  Service: Endoscopy;;   ESOPHAGOGASTRODUODENOSCOPY  06/2007   [facility]: Normal esophagus, gastric mucosal abnormality with erythema, biopsy showed gastric antral and fundic mucosa with reactive foveolar hyperplasia, no gastritis, no H. pylori.   ESOPHAGOGASTRODUODENOSCOPY (EGD) WITH PROPOFOL N/A 07/07/2021   Surgeon: Eloise Harman, DO; moderately severe  Candida esophagitis, gastritis, biopsies negative for H. pylori, normal examined duodenum.  Prescribed Diflucan and recommended PPI twice daily.   HAND SURGERY      Current Outpatient Medications  Medication Sig Dispense Refill   Aspirin-Caffeine (BC FAST PAIN RELIEF PO) Take 1 packet by mouth every 6 (six) hours as needed (pain).     clonazePAM (KLONOPIN) 0.5 MG tablet Take 0.5 mg by mouth 3 (three) times daily as needed for anxiety.     dicyclomine (BENTYL) 20 MG tablet TAKE (1) TABLET BY MOUTH THREE TIMES DAILY WITH FOOD.HOLD FOR CONSTIPATION 90 tablet 3   famotidine (PEPCID) 20 MG tablet Take 1 tablet (20 mg total) by mouth 2 (two) times daily. 60 tablet 1   hydrOXYzine (ATARAX/VISTARIL) 50 MG tablet Take 50 mg by mouth every 6 (six) hours as needed for anxiety.     linaclotide (LINZESS) 145 MCG CAPS capsule Take 1 capsule (145 mcg total) by mouth daily before breakfast. 30 capsule 3   metoprolol succinate (TOPROL-XL) 100 MG 24 hr tablet Take 100 mg by mouth daily. Take with or immediately following a meal.     ondansetron (ZOFRAN-ODT) 4 MG disintegrating tablet DISSOLVE 1 OR 2 TABLETS ON THE TONGUE EVERY 8 HOURS AS NEEDED 60 tablet 3   pantoprazole (PROTONIX) 40 MG tablet Take 1 tablet (40 mg total) by mouth 2 (two) times daily before a meal. 60 tablet 3   temazepam (RESTORIL) 30 MG capsule Take 30 mg by mouth at bedtime as needed for sleep.     venlafaxine (EFFEXOR) 75 MG tablet Take 75 mg by mouth daily.     No current facility-administered medications for this visit.    Allergies as of 02/15/2022 - Review Complete 11/08/2021  Allergen Reaction Noted   Buprenorphine Other (See Comments) 01/24/2016   Penicillins Swelling and Rash 07/08/2011   Aripiprazole Other (See Comments) 08/01/2020   Seroquel [quetiapine] Other (See Comments) 08/18/2020   Cefaclor Other (See Comments)    Doxycycline Rash 08/01/2020   Gabapentin Nausea And Vomiting 01/24/2016   Ibuprofen Swelling and Rash  07/08/2011   Levofloxacin Rash 01/24/2016   Lisinopril Nausea And Vomiting 08/01/2020   Macrobid [nitrofurantoin monohyd macro] Rash 11/30/2014   Naproxen Swelling and Rash 07/08/2011    Family History  Problem Relation Age of Onset   Cancer Other    Seizures Other    Stroke Other    Diabetes Other     Social History   Socioeconomic History   Marital status: Married    Spouse name: benito   Number of children: 2   Years of education: Not on file   Highest education level: Associate degree: occupational, Hotel manager, or vocational program  Occupational History    Comment: not employed  Tobacco Use   Smoking status: Every Day    Packs/day: 1.00    Years: 20.00    Total pack years: 20.00    Types: Cigarettes, E-cigarettes   Smokeless tobacco: Never  Vaping Use   Vaping Use: Never used  Substance and Sexual Activity   Alcohol use: No   Drug use: No   Sexual activity:  Yes    Birth control/protection: Surgical  Other Topics Concern   Not on file  Social History Narrative   Not on file   Social Determinants of Health   Financial Resource Strain: Low Risk  (03/19/2017)   Overall Financial Resource Strain (CARDIA)    Difficulty of Paying Living Expenses: Not very hard  Food Insecurity: No Food Insecurity (03/19/2017)   Hunger Vital Sign    Worried About Running Out of Food in the Last Year: Never true    Ran Out of Food in the Last Year: Never true  Transportation Needs: No Transportation Needs (03/19/2017)   PRAPARE - Hydrologist (Medical): No    Lack of Transportation (Non-Medical): No  Physical Activity: Inactive (03/19/2017)   Exercise Vital Sign    Days of Exercise per Week: 0 days    Minutes of Exercise per Session: 0 min  Stress: Stress Concern Present (03/19/2017)   [place]    Feeling of Stress : To some extent  Social Connections: Somewhat Isolated (03/19/2017)    Social Connection and Isolation Panel [NHANES]    Frequency of Communication with Friends and Family: More than three times a week    Frequency of Social Gatherings with Friends and Family: Twice a week    Attends Religious Services: Never    Marine scientist or Organizations: No    Attends Music therapist: Never    Marital Status: Married    Review of Systems: Gen: Denies fever, chills, cold or flu like symptoms, per-syncope, or syncope.  CV: Denies chest pain, palpitations. Resp: Denies dyspnea, cough.  GI: See HPI Heme: See HPI  Physical Exam: There were no vitals taken for this visit. General:   Alert and oriented. No distress noted. Pleasant and cooperative.  Head:  Normocephalic and atraumatic. Eyes:  Conjuctiva clear without scleral icterus. Heart:  S1, S2 present without murmurs appreciated. Lungs:  Clear to auscultation bilaterally. No wheezes, rales, or rhonchi. No distress.  Abdomen:  +BS, soft, non-tender and non-distended. No rebound or guarding. No HSM or masses noted. Msk:  Symmetrical without gross deformities. Normal posture. Extremities:  Without edema. Neurologic:  Alert and  oriented x4 Psych:  Normal mood and affect.    Assessment:     Plan:  ***   Aliene Altes, PA-C [facility] 02/15/2022

## 2022-02-15 ENCOUNTER — Ambulatory Visit: Payer: BC Managed Care – PPO | Admitting: Gastroenterology

## 2022-02-15 NOTE — Progress Notes (Signed)
Primary Care Physician:  Vidal Schwalbe, MD  Primary GI: Dr. Abbey Chatters  Patient Location: Home   Provider Location: [facility] office   Reason for Visit: Follow-up   Persons present on the virtual encounter, with roles: Aliene Altes, PA-C (Provider), [name] (patient)   Total time (minutes) spent on medical discussion: 16 minutes  Virtual Visit via video Note  I connected with Webb Silversmith on 02/16/22 at 10:30 AM EDT by video and verified that I am speaking with the correct person using two identifiers.   I discussed the limitations, risks, security and privacy concerns of performing an evaluation and management service by video and the availability of in person appointments. I also discussed with the patient that there may be a patient responsible charge related to this service. The patient expressed understanding and agreed to proceed.  Chief Complaint  Patient presents with   Follow-up     History of Present Illness: [name] is a 51 y.o. female presenting today for follow-up.    History of GERD, candida esophagitis in February 2023, chronic nausea, constipation, colitis on CT in April 2022 and treated empirically for IBS/suspected Crohn's with prednisone and Entocort. Follow-up colonoscopy February 2023 (off all steroids for several months)  with nonbleeding internal hemorrhoids, normal TI biopsied and normal colon biopsied.  Pathology was unremarkable. EGD in February with Candida esophagitis, gastritis.  She was treated with fluconazole.   Interestingly, patient previously reported history of colitis followed by [facility] several years ago treated with prednisone but never any long-term medications.     Last seen in our office 11/08/2021.  She continued with daily nausea taking Zofran up to 3 times a day.  This has been present for years.  Also with reflux symptoms daily on pantoprazole once daily and Pepcid.  Some early satiety.  Down 5 pounds in the last 7 months.   Also taking dicyclomine 20 mg up to 3 times a day to prevent abdominal cramping with meals.  Reported cramping started in the epigastric area, but would move down to her lower abdomen, "moving with food".  With Bentyl, she had no pain.  Previously LFTs normal and MRI abdomen without biliary abnormalities.  Bowels moving daily as long as she eats well.  Also taking Linzess every couple of days.  No BRBPR or melena.  She reported all of her GI symptoms were chronic for the last 20 years or so and felt like it started up after having her gallbladder removed in 2003.  Denied NSAIDs.  Oxycodone has been discontinued 2 months ago.  Recommended increasing PPI to twice daily.  Could consider gastric emptying study in the future if nausea/early satiety continued despite better management of GERD.  Etiology of her chronic abdominal pain was not clear.  Suspected multifactorial component in the setting of GERD, gastritis, functional abdominal pain, bowel hypersensitivity, possible IBS, possible gastroparesis, and unable to rule out chronic mesenteric ischemia.  Patient preferred to continue medical management for now. Planned to follow-up in 3 months.   Today:  GERD:  Improved with pantoprazole 40 mg BID. Well controlled. No dysphagia.   Nausea/early satiety:  Continues with daily symptoms. Trying to eat small meals. Eating twice a day on a good day. No appetite. Using Zofran most every day. Zofran usually helps prevent vomiting.   Chronic postprandial abdominal pain:  Fairly well controlled with dicyclomine TID. Pain can start in the upper abdomen but will move all over her abdomen as food is  digesting. With dicyclomine, she doesn't have much pain.   Per patient, she weighed 113 lbs when she saw her PCP a couple das ago which is stable/up 2 lbs from last visit.   Constipation:  Taking Linzess 145 mcg a couple times a week.  No BRBPR or melena.  Bowels seem to move every 2 to 3 days, but feels taking Linzess 145  mcg every day will cause diarrhea.  Sometimes she has diarrhea with Linzess or if eating spicy foods or occasionally fatty foods.  She can get some cramping lower abdominal pain prior to a bowel movement that will improve thereafter when she "cleans out".  Past Medical History:  Diagnosis Date   Chronic abdominal pain    Chronic back pain    Colitis    Constipation    GERD (gastroesophageal reflux disease)    Hypertension    Migraine    Plumbism    blood clot    PONV (postoperative nausea and vomiting)    Renal mass    Uterine cancer [facility])    age 60     Past Surgical History:  Procedure Laterality Date   ABDOMINAL HYSTERECTOMY     BACK SURGERY     BIOPSY  07/07/2021   Procedure: BIOPSY;  Surgeon: Eloise Harman, DO;  [facility];  Service: Endoscopy;;   CHOLECYSTECTOMY     Complicated by bile leak requiring ERCP with temporary stenting   COLONOSCOPY WITH ESOPHAGOGASTRODUODENOSCOPY (EGD)  07/2009   [facility]: Op notes cannot be seen through care everywhere however pathology showed terminal ileum and random colon biopsies normal.  Stomach biopsy with gastric antral and fundic mucosa with reactive foveolar hyperplasia.  No active gastritis.  Stains negative for H. pylori.   COLONOSCOPY WITH PROPOFOL N/A 07/07/2021   Surgeon: Hurshel Keys K, DO;  Non-bleeding internal hemorrhoids, normal TI  biopsied, normal colon biopsied. Pathology with colon and TI biopsies normal.   ESOPHAGEAL BRUSHING  07/07/2021   Procedure: ESOPHAGEAL BRUSHING;  Surgeon: Eloise Harman, DO;  [facility];  Service: Endoscopy;;   ESOPHAGOGASTRODUODENOSCOPY  06/2007   [facility]: Normal esophagus, gastric mucosal abnormality with erythema, biopsy showed gastric antral and fundic mucosa with reactive foveolar hyperplasia, no gastritis, no H. pylori.   ESOPHAGOGASTRODUODENOSCOPY (EGD) WITH PROPOFOL N/A 07/07/2021   Surgeon: Eloise Harman, DO; moderately severe Candida esophagitis,  gastritis, biopsies negative for H. pylori, normal examined duodenum.  Prescribed Diflucan and recommended PPI twice daily.   HAND SURGERY       Current Meds  Medication Sig   famotidine (PEPCID) 20 MG tablet Take 1 tablet (20 mg total) by mouth 2 (two) times daily.   FLUoxetine (PROZAC) 10 MG capsule 1 capsule Orally Once a day for 30 days   hydrOXYzine (ATARAX/VISTARIL) 50 MG tablet Take 50 mg by mouth every 6 (six) hours as needed for anxiety.   linaclotide (LINZESS) 145 MCG CAPS capsule Take 1 capsule (145 mcg total) by mouth daily before breakfast.   metoprolol succinate (TOPROL-XL) 100 MG 24 hr tablet Take 100 mg by mouth daily. Take with or immediately following a meal.   pantoprazole (PROTONIX) 40 MG tablet Take 1 tablet (40 mg total) by mouth 2 (two) times daily before a meal.   temazepam (RESTORIL) 30 MG capsule Take 30 mg by mouth at bedtime as needed for sleep.   VRAYLAR 1.5 MG capsule Take 1.5 mg by mouth daily.   [DISCONTINUED] dicyclomine (BENTYL) 20 MG tablet TAKE (1)  TABLET BY MOUTH THREE TIMES DAILY WITH FOOD.HOLD FOR CONSTIPATION   [DISCONTINUED] ondansetron (ZOFRAN-ODT) 4 MG disintegrating tablet DISSOLVE 1 OR 2 TABLETS ON THE TONGUE EVERY 8 HOURS AS NEEDED     Family History  Problem Relation Age of Onset   Cancer Other    Seizures Other    Stroke Other    Diabetes Other     Social History   Socioeconomic History   Marital status: Married    Spouse name: benito   Number of children: 2   Years of education: Not on file   Highest education level: Associate degree: occupational, Hotel manager, or vocational program  Occupational History    Comment: not employed  Tobacco Use   Smoking status: Every Day    Packs/day: 1.00    Years: 20.00    Total pack years: 20.00    Types: Cigarettes, E-cigarettes   Smokeless tobacco: Never  Vaping Use   Vaping Use: Never used  Substance and Sexual Activity   Alcohol use: No   Drug use: No   Sexual activity: Yes    Birth  control/protection: Surgical  Other Topics Concern   Not on file  Social History Narrative   Not on file   Social Determinants of Health   Financial Resource Strain: Low Risk  (03/19/2017)   Overall Financial Resource Strain (CARDIA)    Difficulty of Paying Living Expenses: Not very hard  Food Insecurity: No Food Insecurity (03/19/2017)   Hunger Vital Sign    Worried About Running Out of Food in the Last Year: Never true    Ran Out of Food in the Last Year: Never true  Transportation Needs: No Transportation Needs (03/19/2017)   PRAPARE - Hydrologist (Medical): No    Lack of Transportation (Non-Medical): No  Physical Activity: Inactive (03/19/2017)   Exercise Vital Sign    Days of Exercise per Week: 0 days    Minutes of Exercise per Session: 0 min  Stress: Stress Concern Present (03/19/2017)   [place]    Feeling of Stress : To some extent  Social Connections: Somewhat Isolated (03/19/2017)   Social Connection and Isolation Panel [NHANES]    Frequency of Communication with Friends and Family: More than three times a week    Frequency of Social Gatherings with Friends and Family: Twice a week    Attends Religious Services: Never    Marine scientist or Organizations: No    Attends Music therapist: Never    Marital Status: Married       Review of Systems: Gen: Denies fever, chills, cold or flulike symptoms, presyncope, syncope. CV: Denies chest pain, palpitations. Resp: Denies dyspnea, cough. GI: see HPI Heme: See HPI  Observations/Objective: No distress. Alert and oriented. Pleasant. Well nourished. Normal mood and affect. Unable to perform complete physical exam due to video encounter.    Assessment:  51 year old female with GI history of GERD, Candida esophagitis in February 2023 treated with fluconazole, chronic abdominal pain, chronic nausea/vomiting/early  satiety, constipation, colitis on CT in April 2022 treated empirically for IBS/suspected Crohn's with prednisone and Entocort, but follow-up colonoscopy February 2023 (off all steroids for several months) with normal TI and colon both biopsied with unremarkable pathology.  She is presenting today for follow-up.  GERD: Much improved in the well-controlled with pantoprazole 40 mg twice daily.  Has Pepcid to use as needed.  No dysphagia.  Postprandial nausea/vomiting/early satiety:  Chronic and unchanged.  EGD in February 2023 with Candida esophagitis, gastritis.  She was treated with fluconazole.  GERD is well controlled.  History of cholecystectomy. Previously LFTs wnl and MRI abdomen last year without biliary abnormalities. Query gastroparesis. Will arrange gastric emptying study.  Chronic abdominal pain: Postprandial abdominal pain often starting in the upper abdomen and moving down through her abdomen, "moving with the food".  Also with intermittent lower abdominal cramping prior to Bms that improve thereafter. Symptoms have been present for close to 20 years, currently controlling her symptoms fairly well with dicyclomine 20 mg 3 times daily. She had lost about 5 lbs in a 7 month period when I saw her in June, but has since gained 2 lbs.   Etiology of her symptoms is not clear and may be multifactorial.  She did have an episode of colitis in April 2022 and had previously reported history of colitis, possibly ulcerative colitis followed by [facility], but never on any chronic medications.  However, after empiric treatment with prednisone/Entocort and and off of steroids for several months, her follow-up colonoscopy was entirely normal yet she had no change in her chronic abdominal pain.  Follow-up CT in July 2022 also with no acute abnormalities and MRI abdomen with and without contrast in August 2022 with no significant GI abnormalities, stable left kidney lesion. She is scheduled for follow-up MRI abdomen  next week due to her kidney lesion.   Symptoms may be multifactorial with differentials including functional abdominal pain, bowel hypersensitivity, possible IBS, query gastroparesis with reports of nausea/vomiting/early satiety discussed above. We are making adjustments to Linzess for better management of constipation and will arrange GES. If GES is abnormal, will likely treat with Reglan. If normal, will likely proceed with CT angio to rule out mesenteric ischemia.   Constipation:  Chronic. Previously felt to be influenced by chronic pain medications though they have been discontinued for several months now.  Bowels move every 2 to 3 days, taking Linzess 145 mcg as needed as this dose does cause diarrhea at times. Suspect she may have a component of IBS as she reports intermittent lower abdominal cramping prior to bowel movements that will improve when she "cleans out".  I have recommended trial of Linzess 72 mcg every day.  She will come by our office next week to pick up samples and call with a progress report in 1 week.    Plan: Gastric emptying study.  Hold dicyclomine x2 days prior to gastric emptying study. Continue pantoprazole 40 mg twice daily 30 minutes before breakfast and dinner. May use famotidine as needed for breakthrough reflux symptoms. Continue dicyclomine 3 times daily before meals. Continue Zofran as needed. 4-6 small meals daily. Low-fat diet.  Meats should be baked, boiled, broiled, grilled.  Stick with poultry or fish. Low fiber diet. Start Linzess 72 mcg daily.  She is to come by our office next week to pick up samples and call with a progress report in 1 week. Follow-up in about 3 months or sooner if needed.     I discussed the assessment and treatment plan with the patient. The patient was provided an opportunity to ask questions and all were answered. The patient agreed with the plan and demonstrated an understanding of the instructions.   The patient was  advised to call back or seek an in-person evaluation if the symptoms worsen or if the condition fails to improve as anticipated.  I provided 16 minutes of video-face-to-face time during this encounter.  Aliene Altes, PA-C [facility]  02/16/2022

## 2022-02-16 ENCOUNTER — Telehealth: Payer: Self-pay | Admitting: *Deleted

## 2022-02-16 ENCOUNTER — Encounter: Payer: Self-pay | Admitting: Gastroenterology

## 2022-02-16 ENCOUNTER — Telehealth (INDEPENDENT_AMBULATORY_CARE_PROVIDER_SITE_OTHER): Payer: BC Managed Care – PPO | Admitting: Gastroenterology

## 2022-02-16 ENCOUNTER — Telehealth: Payer: Self-pay | Admitting: Gastroenterology

## 2022-02-16 VITALS — Ht 61.0 in | Wt 113.0 lb

## 2022-02-16 DIAGNOSIS — R1084 Generalized abdominal pain: Secondary | ICD-10-CM

## 2022-02-16 DIAGNOSIS — K219 Gastro-esophageal reflux disease without esophagitis: Secondary | ICD-10-CM

## 2022-02-16 DIAGNOSIS — R112 Nausea with vomiting, unspecified: Secondary | ICD-10-CM

## 2022-02-16 DIAGNOSIS — K5900 Constipation, unspecified: Secondary | ICD-10-CM

## 2022-02-16 DIAGNOSIS — R6881 Early satiety: Secondary | ICD-10-CM

## 2022-02-16 MED ORDER — DICYCLOMINE HCL 20 MG PO TABS: ORAL_TABLET | ORAL | 3 refills | Status: DC

## 2022-02-16 MED ORDER — ONDANSETRON 4 MG PO TBDP: ORAL_TABLET | ORAL | 3 refills | Status: DC

## 2022-02-16 NOTE — Telephone Encounter (Signed)
Noted. Place in drawer at front desk

## 2022-02-16 NOTE — Telephone Encounter (Signed)
[name], you are scheduled for a virtual visit with your provider today.  Just as we do with appointments in the office, we must obtain your consent to participate.  Your consent will be active for this visit and any virtual visit you may have with one of our providers in the next 365 days.  If you have a MyChart account, I can also send a copy of this consent to you electronically.  All virtual visits are billed to your insurance company just like a traditional visit in the office.  As this is a virtual visit, video technology does not allow for your provider to perform a traditional examination.  This may limit your provider's ability to fully assess your condition.  If your provider identifies any concerns that need to be evaluated in person or the need to arrange testing such as labs, EKG, etc, we will make arrangements to do so.  Although advances in technology are sophisticated, we cannot ensure that it will always work on either your end or our end.  If the connection with a video visit is poor, we may have to switch to a telephone visit.  With either a video or telephone visit, we are not always able to ensure that we have a secure connection.   I need to obtain your verbal consent now.   Are you willing to proceed with your visit today?  Patient consent to virtual video visits.

## 2022-02-16 NOTE — Telephone Encounter (Signed)
Courtney:  Patient needs samples of Linzess 72 mcg x 3.  She plans to come by the office Wednesday, 10/11, to pick these up.  Mindy/Tammy: Please arrange gastric emptying study.  Orders have been placed. Dx: Nausea, vomiting, early satiety  Manuela Schwartz:  Please arrange follow-up with Dr. Abbey Chatters in 3 months.

## 2022-02-16 NOTE — Patient Instructions (Addendum)
We will arrange for you to have a gastric emptying study at [facility] dicyclomine x 2 days prior to our gastric emptying study.   Continue pantoprazole 40 mg twice daily 30 minutes before breakfast and dinner.  You may use famotidine as needed for breakthrough heartburn symptoms.  Continue dicyclomine 3 times daily before meals to help with abdominal cramping.  Continue Zofran as needed for nausea/vomiting.  Diet recommendations: Eat 4-6 small meals day.  Follow a low-fat diet.  Meat should be baked, boiled, broiled, grilled.  Stick with poultry or fish. Follow a low fiber diet.  Avoid raw vegetables.  I would like for you to try taking Linzess every day.  As you feel that Linzess 145 mcg every day will be too strong, lets try Linzess 72 mcg.  Please come by our office to pick up samples next week and call me with a progress report 1 week after you start the new dose.  The goal is for you to have good productive bowel movements every day.  We will call you with results and additional recommendations.  We will plan to follow-up in the office in about 3 months or sooner if needed.  Aliene Altes, PA-C [facility]

## 2022-02-16 NOTE — Telephone Encounter (Signed)
Spoke with pt and she is aware of GES appt details.

## 2022-02-16 NOTE — Telephone Encounter (Signed)
Checked carelon and no PA required

## 2022-02-21 ENCOUNTER — Encounter (HOSPITAL_COMMUNITY): Payer: Self-pay

## 2022-02-21 ENCOUNTER — Ambulatory Visit (HOSPITAL_COMMUNITY): Payer: BC Managed Care – PPO

## 2022-02-22 ENCOUNTER — Encounter (HOSPITAL_COMMUNITY)
Admission: RE | Admit: 2022-02-22 | Discharge: 2022-02-22 | Disposition: A | Discharge status: Home / Self Care | Payer: BC Managed Care – PPO | Source: Ambulatory Visit | Attending: Gastroenterology | Admitting: Gastroenterology

## 2022-02-22 DIAGNOSIS — R112 Nausea with vomiting, unspecified: Secondary | ICD-10-CM | POA: Diagnosis present

## 2022-02-22 DIAGNOSIS — R6881 Early satiety: Secondary | ICD-10-CM | POA: Diagnosis not present

## 2022-02-22 MED ORDER — TECHNETIUM TC 99M SULFUR COLLOID
2.0000 | Freq: Once | INTRAVENOUS | Status: AC | PRN
  Administered 2022-02-22: 2.1 via ORAL

## 2022-02-22 NOTE — Telephone Encounter (Signed)
OV made °

## 2022-02-23 ENCOUNTER — Other Ambulatory Visit: Payer: Self-pay | Admitting: *Deleted

## 2022-02-23 DIAGNOSIS — R112 Nausea with vomiting, unspecified: Secondary | ICD-10-CM

## 2022-02-23 DIAGNOSIS — R1084 Generalized abdominal pain: Secondary | ICD-10-CM

## 2022-03-11 ENCOUNTER — Other Ambulatory Visit: Payer: Self-pay | Admitting: Gastroenterology

## 2022-03-11 DIAGNOSIS — K219 Gastro-esophageal reflux disease without esophagitis: Secondary | ICD-10-CM

## 2022-03-17 ENCOUNTER — Other Ambulatory Visit: Payer: Self-pay | Admitting: Gastroenterology

## 2022-03-17 DIAGNOSIS — R112 Nausea with vomiting, unspecified: Secondary | ICD-10-CM

## 2022-03-18 ENCOUNTER — Other Ambulatory Visit: Payer: Self-pay | Admitting: Gastroenterology

## 2022-03-18 DIAGNOSIS — R1084 Generalized abdominal pain: Secondary | ICD-10-CM

## 2022-03-21 ENCOUNTER — Ambulatory Visit (HOSPITAL_COMMUNITY): Payer: BC Managed Care – PPO

## 2022-03-21 ENCOUNTER — Encounter (HOSPITAL_COMMUNITY): Payer: Self-pay

## 2022-03-27 ENCOUNTER — Other Ambulatory Visit: Payer: Self-pay | Admitting: Gastroenterology

## 2022-03-27 DIAGNOSIS — R112 Nausea with vomiting, unspecified: Secondary | ICD-10-CM

## 2022-04-06 ENCOUNTER — Other Ambulatory Visit (HOSPITAL_COMMUNITY): Payer: BC Managed Care – PPO

## 2022-04-12 ENCOUNTER — Other Ambulatory Visit: Payer: Self-pay | Admitting: Gastroenterology

## 2022-04-12 DIAGNOSIS — R112 Nausea with vomiting, unspecified: Secondary | ICD-10-CM

## 2022-04-16 ENCOUNTER — Other Ambulatory Visit: Payer: Self-pay | Admitting: Gastroenterology

## 2022-04-16 DIAGNOSIS — R112 Nausea with vomiting, unspecified: Secondary | ICD-10-CM

## 2022-04-17 ENCOUNTER — Telehealth: Payer: Self-pay | Admitting: *Deleted

## 2022-04-17 DIAGNOSIS — F329 Major depressive disorder, single episode, unspecified: Secondary | ICD-10-CM | POA: Diagnosis not present

## 2022-04-17 DIAGNOSIS — F411 Generalized anxiety disorder: Secondary | ICD-10-CM | POA: Diagnosis not present

## 2022-04-17 NOTE — Telephone Encounter (Signed)
PA for CTA: Order ID: [id number]       Authorized  Approval Valid Through: 04/17/2022 - 05/16/2022

## 2022-04-19 NOTE — Telephone Encounter (Signed)
Phoned the pt and advised this Rx would not be filled at this time

## 2022-04-19 NOTE — Telephone Encounter (Signed)
Pt was advised that she had a refill sent in as well and if it is too soon Rx's will not be filled

## 2022-04-20 ENCOUNTER — Ambulatory Visit (HOSPITAL_COMMUNITY): Admission: RE | Admit: 2022-04-20 | Payer: Medicaid Other | Source: Ambulatory Visit

## 2022-04-24 ENCOUNTER — Telehealth: Payer: Self-pay | Admitting: *Deleted

## 2022-04-24 NOTE — Telephone Encounter (Addendum)
FYI routing to you in absence of [place], [place].  Pt called and [facility]

## 2022-04-25 ENCOUNTER — Other Ambulatory Visit (INDEPENDENT_AMBULATORY_CARE_PROVIDER_SITE_OTHER): Payer: Self-pay | Admitting: Gastroenterology

## 2022-04-25 DIAGNOSIS — R112 Nausea with vomiting, unspecified: Secondary | ICD-10-CM

## 2022-04-25 MED ORDER — ONDANSETRON 4 MG PO TBDP: ORAL_TABLET | ORAL | 1 refills | Status: DC

## 2022-04-25 NOTE — Telephone Encounter (Signed)
Spoke with pt and she says that she takes 3 to 4 a day in order to be able to eat. Contacted the pharmacy and they stated that she has picked up a qty: of 120 since 04/12/2022. Pt states that she is unable to eat if she doesn't have them to take before hand.

## 2022-04-25 NOTE — Telephone Encounter (Signed)
Noted  

## 2022-04-25 NOTE — Telephone Encounter (Signed)
Pt was made aware and verbalized understanding.

## 2022-04-26 ENCOUNTER — Other Ambulatory Visit: Payer: Self-pay | Admitting: *Deleted

## 2022-04-26 DIAGNOSIS — R112 Nausea with vomiting, unspecified: Secondary | ICD-10-CM

## 2022-04-26 DIAGNOSIS — R1084 Generalized abdominal pain: Secondary | ICD-10-CM

## 2022-04-26 NOTE — Telephone Encounter (Signed)
Pt informed of CTA scheduled for 06/05/22 at 10 am, arrive at 9:30 am, liquids only 4 hours prior.

## 2022-04-30 DIAGNOSIS — J209 Acute bronchitis, unspecified: Secondary | ICD-10-CM | POA: Diagnosis not present

## 2022-04-30 DIAGNOSIS — I10 Essential (primary) hypertension: Secondary | ICD-10-CM | POA: Diagnosis not present

## 2022-04-30 DIAGNOSIS — D509 Iron deficiency anemia, unspecified: Secondary | ICD-10-CM | POA: Diagnosis not present

## 2022-04-30 DIAGNOSIS — E039 Hypothyroidism, unspecified: Secondary | ICD-10-CM | POA: Diagnosis not present

## 2022-04-30 DIAGNOSIS — Z720 Tobacco use: Secondary | ICD-10-CM | POA: Diagnosis not present

## 2022-04-30 DIAGNOSIS — N281 Cyst of kidney, acquired: Secondary | ICD-10-CM | POA: Diagnosis not present

## 2022-04-30 DIAGNOSIS — E559 Vitamin D deficiency, unspecified: Secondary | ICD-10-CM | POA: Diagnosis not present

## 2022-04-30 DIAGNOSIS — G4700 Insomnia, unspecified: Secondary | ICD-10-CM | POA: Diagnosis not present

## 2022-04-30 DIAGNOSIS — E785 Hyperlipidemia, unspecified: Secondary | ICD-10-CM | POA: Diagnosis not present

## 2022-04-30 DIAGNOSIS — N179 Acute kidney failure, unspecified: Secondary | ICD-10-CM | POA: Diagnosis not present

## 2022-04-30 DIAGNOSIS — G8929 Other chronic pain: Secondary | ICD-10-CM | POA: Diagnosis not present

## 2022-04-30 DIAGNOSIS — K219 Gastro-esophageal reflux disease without esophagitis: Secondary | ICD-10-CM | POA: Diagnosis not present

## 2022-05-11 ENCOUNTER — Telehealth: Payer: Self-pay | Admitting: *Deleted

## 2022-05-11 NOTE — Progress Notes (Unsigned)
Primary Care Physician:  Vidal Schwalbe, MD  Primary Gastroenterologist: Elon Alas.Abbey Chatters, DO  Patient Location: Home Reason for Visit: follow up***  Persons present on the virtual encounter, with roles: Patient - [name]; Provider - Venetia Night, NP   Total time (minutes) spent on medical discussion: *** minutes  Virtual Visit Encounter Note Visit is conducted virtually and was requested by patient.   I connected with [name] on 05/11/22 at  8:30 AM EST by video*** and verified that I am speaking with the correct person using two identifiers.   I discussed the limitations, risks, security and privacy concerns of performing an evaluation and management service by video*** and the availability of in person appointments. I also discussed with the patient that there may be a patient responsible charge related to this service. The patient expressed understanding and agreed to proceed.  No chief complaint on file.    History of Present Illness: [name] is a 51 y.o. female with a history of GERD, Candida esophagitis February 2023 treated with Diflucan, chronic nausea, chronic constipation, colitis on CT in April 2022 treated empirically for IBS/suspected Crohn's with prednisone and Entocort with follow-up colonoscopy in February 2023 normal other than nonbleeding internal hemorrhoids and normal colon biopsies who presents today for virtual follow-up regarding multiple chronic GI issues.***  EGD in February 2023: -Candida esophagitis -Gastritis -Treated with Diflucan  Last visit completed virtually 02/16/2022.  She reported improvement in GERD with twice daily dosing of pantoprazole 40 mg.  Denied any dysphagia.  Reported ongoing daily nausea symptoms, trying to eat small meals.  Not much of an appetite and only eating about 2 good meals a day.  Using Zofran on a daily basis which helps prevent vomiting.  Reported postprandial abdominal pain controlled with  dicyclomine 3 times daily however would have intermittent pain that starts in her upper abdomen and moves throughout her abdomen with digestion.  Reported weight has been stable.  Only taking Linzess 145 mcg a couple times per week with a bowel movement every 2 to 3 days.  Only taking Linzess as needed due to diarrhea.  Having some occasional lower abdominal cramping that improves with defecation.  GES ordered to be done off dicyclomine.  Advised to continue PPI twice daily and dicyclomine 3 times daily.  Continue Zofran as needed.  Advised on gastroparesis precautions.  Advised to stop Linzess 145 mcg daily and trial Linzess 72 mcg daily.  Follow-up in 3 months or sooner if needed.   Wt Readings from Last 3 Encounters:  02/16/22 113 lb (51.3 kg)  11/08/21 111 lb (50.3 kg)  09/05/21 113 lb (51.3 kg)   Today: GERD:  Nausea/vomiting/early satiety:  Abdominal pain:  Weight:  Constipation:    Medications No outpatient medications have been marked as taking for the 05/16/22 encounter (Appointment) with Sherron Monday, NP.     History Past Medical History:  Diagnosis Date   Chronic abdominal pain    Chronic back pain    Colitis    Constipation    GERD (gastroesophageal reflux disease)    Hypertension    Migraine    Plumbism    blood clot    PONV (postoperative nausea and vomiting)    Renal mass    Uterine cancer [facility])    age 1    Past Surgical History:  Procedure Laterality Date   ABDOMINAL HYSTERECTOMY     BACK SURGERY     BIOPSY  07/07/2021  Procedure: BIOPSY;  Surgeon: Eloise Harman, DO;  [facility]: [facility];  Service: Endoscopy;;   CHOLECYSTECTOMY     Complicated by bile leak requiring ERCP with temporary stenting   COLONOSCOPY WITH ESOPHAGOGASTRODUODENOSCOPY (EGD)  07/2009   [facility]: Op notes cannot be seen through care everywhere however pathology showed terminal ileum and random colon biopsies normal.  Stomach biopsy with gastric antral and fundic mucosa  with reactive foveolar hyperplasia.  No active gastritis.  Stains negative for H. pylori.   COLONOSCOPY WITH PROPOFOL N/A 07/07/2021   Surgeon: Hurshel Keys K, DO;  Non-bleeding internal hemorrhoids, normal TI  biopsied, normal colon biopsied. Pathology with colon and TI biopsies normal.   ESOPHAGEAL BRUSHING  07/07/2021   Procedure: ESOPHAGEAL BRUSHING;  Surgeon: Eloise Harman, DO;  Location: [facility];  Service: Endoscopy;;   ESOPHAGOGASTRODUODENOSCOPY  06/2007   [facility]: Normal esophagus, gastric mucosal abnormality with erythema, biopsy showed gastric antral and fundic mucosa with reactive foveolar hyperplasia, no gastritis, no H. pylori.   ESOPHAGOGASTRODUODENOSCOPY (EGD) WITH PROPOFOL N/A 07/07/2021   Surgeon: Eloise Harman, DO; moderately severe Candida esophagitis, gastritis, biopsies negative for H. pylori, normal examined duodenum.  Prescribed Diflucan and recommended PPI twice daily.   HAND SURGERY      Family History  Problem Relation Age of Onset   Cancer Other    Seizures Other    Stroke Other    Diabetes Other     Social History   Socioeconomic History   Marital status: Married    Spouse name: benito   Number of children: 2   Years of education: Not on file   Highest education level: Associate degree: occupational, Hotel manager, or vocational program  Occupational History    Comment: not employed  Tobacco Use   Smoking status: Every Day    Packs/day: 1.00    Years: 20.00    Total pack years: 20.00    Types: Cigarettes, E-cigarettes   Smokeless tobacco: Never  Vaping Use   Vaping Use: Never used  Substance and Sexual Activity   Alcohol use: No   Drug use: No   Sexual activity: Yes    Birth control/protection: Surgical  Other Topics Concern   Not on file  Social History Narrative   Not on file   Social Determinants of Health   Financial Resource Strain: Low Risk  (03/19/2017)   Overall Financial Resource Strain (CARDIA)    Difficulty of  Paying Living Expenses: Not very hard  Food Insecurity: No Food Insecurity (03/19/2017)   Hunger Vital Sign    Worried About Running Out of Food in the Last Year: Never true    Ran Out of Food in the Last Year: Never true  Transportation [name]: No Transportation [name] (03/19/2017)   PRAPARE - Hydrologist (Medical): No    Lack of Transportation (Non-Medical): No  Physical Activity: Inactive (03/19/2017)   Exercise Vital Sign    Days of Exercise per Week: 0 days    Minutes of Exercise per Session: 0 min  Stress: Stress Concern Present (03/19/2017)   [place]    Feeling of Stress : To some extent  Social Connections: Somewhat Isolated (03/19/2017)   Social Connection and Isolation Panel [NHANES]    Frequency of Communication with Friends and Family: More than three times a week    Frequency of Social Gatherings with Friends and Family: Twice a week    Attends  Religious Services: Never    Active Member of Clubs or Organizations: No    Attends Archivist Meetings: Never    Marital Status: Married      Review of Systems: *** Gen: Denies fever, chills, anorexia. Denies fatigue, weakness, weight loss.  CV: Denies chest pain, palpitations, syncope, peripheral edema, and claudication. Resp: Denies dyspnea at rest, cough, wheezing, coughing up blood, and pleurisy. GI: see HPI Derm: Denies rash, itching, dry skin Psych: Denies depression, anxiety, memory loss, confusion. No homicidal or suicidal ideation.  Heme: see HPI  Observations/Objective: No distress. Alert and oriented. Pleasant. Well nourished. Normal mood and affect. Unable to perform complete physical exam due to video*** encounter.   Assessment:  GERD:   Nausea/vomiting/early satiety: Chronic.  Underwent EGD in February 2023 with Candida esophagitis and gastritis.  Treated with fluconazole.  Continues to state GERD is  well-controlled on PPI twice daily.  History of cholecystectomy.  Prior abdominal imaging with MRI in August 2022 without any biliary abnormalities.  Recent gastric emptying study in October 2023 normal.  Due to this and chronic nature of her symptoms, she was recommended to have a CTA of her abdomen and pelvis.  Appears this is not scheduled until 06/05/2022. ***Has been using Zofran 4 mg tablets 1-2 every 8 hours as needed.  She states she has used this before she eats anything.  Chronic abdominal pain: Has reported this has been going on for 20 years.  Typically occurs postprandially in her abdomen and can spread throughout as she digests food. ***Using dicyclomine 3 times daily and as long as she takes that she does not have any abdominal pain.***  ***Previously experiencing weight loss however it appears her weight has been stable recently.***Question of possible history of colitis or concern for ulcerative colitis.  Treated with prednisone/Entocort with follow-up colonoscopy normal but continuing to have abdominal pain.  As previously outlined she has had multiple CT scans and a prior MRI without any acute GI abnormalities.  As reported above GES has been normal which rules out gastroparesis as a possible etiology.  Differentials continue to include IBS, bowel hypersensitivity, functional abdominal pain. As stated above this could be secondary to mesenteric ischemia, CTA abdomen pelvis ordered to be completed later this month.  May also consider trial of nightly TCA like amitriptyline if CTA negative.***  Constipation: Chronic.  Initial etiology felt to be secondary to opioid use.  Previously had been using Linzess 145 mcg as needed due to diarrhea with more frequent dosing.  Likely IBS component given intermittent lower abdominal cramping that improves with defecation.  Trial of Linzess 72 mcg given at last visit ***  Plan:      Follow Up Instructions:  I discussed the assessment and treatment  plan with the patient. The patient was provided an opportunity to ask questions and all were answered. The patient agreed with the plan and demonstrated an understanding of the instructions.   The patient was advised to call back or seek an in-person evaluation if the symptoms worsen or if the condition fails to improve as anticipated.   Venetia Night, MSN, APRN, FNP-BC, AGACNP-BC [facility]

## 2022-05-11 NOTE — Telephone Encounter (Signed)
Pt called and states she needs a refill on her Zofran sent to [facility]. She states she can not eat without taking  Zofran first. She states she takes 3 to 4 a day.

## 2022-05-15 ENCOUNTER — Telehealth: Payer: Self-pay | Admitting: *Deleted

## 2022-05-15 DIAGNOSIS — F329 Major depressive disorder, single episode, unspecified: Secondary | ICD-10-CM | POA: Diagnosis not present

## 2022-05-15 DIAGNOSIS — F411 Generalized anxiety disorder: Secondary | ICD-10-CM | POA: Diagnosis not present

## 2022-05-15 NOTE — Telephone Encounter (Signed)
Noted  

## 2022-05-15 NOTE — Telephone Encounter (Signed)
Error - wrong pt

## 2022-05-15 NOTE — Telephone Encounter (Signed)
Pt's husband came and picked up prescription

## 2022-05-16 ENCOUNTER — Encounter: Payer: Self-pay | Admitting: Gastroenterology

## 2022-05-16 ENCOUNTER — Telehealth (INDEPENDENT_AMBULATORY_CARE_PROVIDER_SITE_OTHER): Payer: Self-pay | Admitting: Gastroenterology

## 2022-05-16 ENCOUNTER — Telehealth: Payer: Self-pay | Admitting: *Deleted

## 2022-05-16 VITALS — Ht 61.0 in | Wt 112.0 lb

## 2022-05-16 DIAGNOSIS — R1013 Epigastric pain: Secondary | ICD-10-CM

## 2022-05-16 DIAGNOSIS — K219 Gastro-esophageal reflux disease without esophagitis: Secondary | ICD-10-CM

## 2022-05-16 DIAGNOSIS — G8929 Other chronic pain: Secondary | ICD-10-CM

## 2022-05-16 DIAGNOSIS — R1084 Generalized abdominal pain: Secondary | ICD-10-CM

## 2022-05-16 DIAGNOSIS — R112 Nausea with vomiting, unspecified: Secondary | ICD-10-CM

## 2022-05-16 DIAGNOSIS — K5900 Constipation, unspecified: Secondary | ICD-10-CM

## 2022-05-16 MED ORDER — DICYCLOMINE HCL 20 MG PO TABS: ORAL_TABLET | ORAL | 3 refills | Status: DC

## 2022-05-16 MED ORDER — PROMETHAZINE HCL 12.5 MG PO TABS: 6.2500 mg | ORAL_TABLET | Freq: Every day | ORAL | 0 refills | Status: DC | PRN

## 2022-05-16 MED ORDER — LINACLOTIDE 72 MCG PO CAPS: 72.0000 ug | ORAL_CAPSULE | Freq: Every day | ORAL | 5 refills | Status: DC

## 2022-05-16 MED ORDER — PANTOPRAZOLE SODIUM 40 MG PO TBEC: DELAYED_RELEASE_TABLET | ORAL | 5 refills | Status: DC

## 2022-05-16 MED ORDER — ONDANSETRON 8 MG PO TBDP: 8.0000 mg | ORAL_TABLET | Freq: Three times a day (TID) | ORAL | 1 refills | Status: DC

## 2022-05-16 NOTE — Telephone Encounter (Signed)
Site Name: [place] Site ID: [id number] Site Address: [address], [place] [address]     Primary Diagnosis Code: R11.2 Description: Nausea with vomiting, unspecified Secondary Diagnosis Code: R10.84 Description: Generalized abdominal pain CPT Code 28366 Description: CT ANGIO ABD&PELV W/O&W/DYE Authorization Number: [id number] Review Date: 05/16/2022 10:59:55 AM Expiration Date: 06/30/2022 Status: Your case has been Approved.

## 2022-05-16 NOTE — Patient Instructions (Addendum)
Please do not miss your appointment for your CTA of your abdomen pelvis on 06/05/2022.  This is to check the blood flow to your intestines to see if this is a cause for your lack of appetite and chronic nausea and previous weight loss.  Prescription sent in today: -Pantoprazole 40 mg twice daily, 30 minutes prior to breakfast and dinner. -Dicyclomine 20 mg 3 times daily. -Zofran 8 mg every 8 hours as needed for nausea. -Phenergan 6.25 mg for breakthrough nausea and intractable vomiting. -Linzess 72 mcg daily, 30 minutes prior to breakfast.  Gastroparesis recommendations:  4-6 small meals daily Low fat diet Low fiber diet (avoid raw fruits and vegetables).   Follow a GERD diet:  Avoid fried, fatty, greasy, spicy, citrus foods. Avoid caffeine and carbonated beverages. Avoid chocolate. Try eating 4-6 small meals a day rather than 3 large meals. Do not eat within 3 hours of laying down. Prop head of bed up on wood or bricks to create a 6 inch incline.  It was a pleasure to see you today. I want to create trusting relationships with patients. If you receive a survey regarding your visit,  I greatly appreciate you taking time to fill this out on paper or through your MyChart. I value your feedback.  Venetia Night, MSN, FNP-BC, AGACNP-BC [facility]

## 2022-05-20 ENCOUNTER — Other Ambulatory Visit (INDEPENDENT_AMBULATORY_CARE_PROVIDER_SITE_OTHER): Payer: Self-pay | Admitting: Gastroenterology

## 2022-05-20 DIAGNOSIS — R112 Nausea with vomiting, unspecified: Secondary | ICD-10-CM

## 2022-05-21 NOTE — Telephone Encounter (Signed)
Med was increased to '8mg'$  on 05/16/22 and '8mg'$  sent to pharmacy. Will deny this request.

## 2022-05-27 ENCOUNTER — Other Ambulatory Visit: Payer: Self-pay

## 2022-05-27 ENCOUNTER — Emergency Department (HOSPITAL_COMMUNITY)
Admission: EM | Admit: 2022-05-27 | Discharge: 2022-05-27 | Disposition: A | Discharge status: Home / Self Care | Payer: Medicaid Other | Attending: Emergency Medicine | Admitting: Emergency Medicine

## 2022-05-27 ENCOUNTER — Emergency Department (HOSPITAL_COMMUNITY): Payer: Medicaid Other

## 2022-05-27 ENCOUNTER — Encounter (HOSPITAL_COMMUNITY): Payer: Self-pay | Admitting: Emergency Medicine

## 2022-05-27 DIAGNOSIS — K6389 Other specified diseases of intestine: Secondary | ICD-10-CM | POA: Diagnosis not present

## 2022-05-27 DIAGNOSIS — R109 Unspecified abdominal pain: Secondary | ICD-10-CM | POA: Diagnosis present

## 2022-05-27 DIAGNOSIS — R112 Nausea with vomiting, unspecified: Secondary | ICD-10-CM | POA: Diagnosis not present

## 2022-05-27 DIAGNOSIS — K529 Noninfective gastroenteritis and colitis, unspecified: Secondary | ICD-10-CM | POA: Diagnosis not present

## 2022-05-27 DIAGNOSIS — I10 Essential (primary) hypertension: Secondary | ICD-10-CM | POA: Diagnosis not present

## 2022-05-27 LAB — CBC
HCT: 42.9 % (ref 36.0–46.0)
Hemoglobin: 13.2 g/dL (ref 12.0–15.0)
MCH: 27.1 pg (ref 26.0–34.0)
MCHC: 30.8 g/dL (ref 30.0–36.0)
MCV: 88.1 fL (ref 80.0–100.0)
Platelets: 507 10*3/uL — ABNORMAL HIGH (ref 150–400)
RBC: 4.87 MIL/uL (ref 3.87–5.11)
RDW: 15.9 % — ABNORMAL HIGH (ref 11.5–15.5)
WBC: 10.9 10*3/uL — ABNORMAL HIGH (ref 4.0–10.5)
nRBC: 0 % (ref 0.0–0.2)

## 2022-05-27 LAB — COMPREHENSIVE METABOLIC PANEL
ALT: 13 U/L (ref 0–44)
AST: 25 U/L (ref 15–41)
Albumin: 3.8 g/dL (ref 3.5–5.0)
Alkaline Phosphatase: 170 U/L — ABNORMAL HIGH (ref 38–126)
Anion gap: 15 (ref 5–15)
BUN: 9 mg/dL (ref 6–20)
CO2: 22 mmol/L (ref 22–32)
Calcium: 9 mg/dL (ref 8.9–10.3)
Chloride: 99 mmol/L (ref 98–111)
Creatinine, Ser: 0.75 mg/dL (ref 0.44–1.00)
GFR, Estimated: >60 mL/min (ref >60)
Glucose, Bld: 139 mg/dL — ABNORMAL HIGH (ref 70–99)
Potassium: 3.4 mmol/L — ABNORMAL LOW (ref 3.5–5.1)
Sodium: 136 mmol/L (ref 135–145)
Total Bilirubin: 0.5 mg/dL (ref 0.3–1.2)
Total Protein: 8.6 g/dL — ABNORMAL HIGH (ref 6.5–8.1)

## 2022-05-27 LAB — LIPASE, BLOOD: Lipase: 31 U/L (ref 11–51)

## 2022-05-27 MED ORDER — PREDNISONE 20 MG PO TABS: 40.0000 mg | ORAL_TABLET | Freq: Every day | ORAL | 0 refills | Status: DC

## 2022-05-27 MED ORDER — METOPROLOL TARTRATE 5 MG/5ML IV SOLN
10.0000 mg | Freq: Once | INTRAVENOUS | Status: AC
  Administered 2022-05-27: 10 mg via INTRAVENOUS
  Filled 2022-05-27: qty 10

## 2022-05-27 MED ORDER — PROMETHAZINE HCL 25 MG PO TABS: 25.0000 mg | ORAL_TABLET | Freq: Four times a day (QID) | ORAL | 0 refills | Status: DC | PRN

## 2022-05-27 MED ORDER — IOHEXOL 300 MG/ML  SOLN
100.0000 mL | Freq: Once | INTRAMUSCULAR | Status: AC | PRN
  Administered 2022-05-27: 80 mL via INTRAVENOUS

## 2022-05-27 MED ORDER — DEXAMETHASONE SODIUM PHOSPHATE 10 MG/ML IJ SOLN
10.0000 mg | Freq: Once | INTRAMUSCULAR | Status: AC
  Administered 2022-05-27: 10 mg via INTRAVENOUS
  Filled 2022-05-27: qty 1

## 2022-05-27 MED ORDER — OXYCODONE-ACETAMINOPHEN 5-325 MG PO TABS: 1.0000 | ORAL_TABLET | Freq: Three times a day (TID) | ORAL | 0 refills | Status: DC | PRN

## 2022-05-27 MED ORDER — HYDROMORPHONE HCL 1 MG/ML IJ SOLN
1.0000 mg | Freq: Once | INTRAMUSCULAR | Status: AC
  Administered 2022-05-27: 1 mg via INTRAVENOUS
  Filled 2022-05-27: qty 1

## 2022-05-27 MED ORDER — ONDANSETRON 4 MG PO TBDP
4.0000 mg | ORAL_TABLET | Freq: Once | ORAL | Status: AC | PRN
  Administered 2022-05-27: 4 mg via ORAL
  Filled 2022-05-27: qty 1

## 2022-05-27 MED ORDER — SODIUM CHLORIDE 0.9 % IV BOLUS
1000.0000 mL | Freq: Once | INTRAVENOUS | Status: AC
  Administered 2022-05-27: 1000 mL via INTRAVENOUS

## 2022-05-27 NOTE — Discharge Instructions (Signed)
I have given you prescriptions for prednisone which should be taken daily for 5 days, Percocet which can be taken 1 tablet every 8 hours as needed for severe pain, Phenergan which can be taken 1 tablet every 6 hours as needed for severe nausea.  You have colitis on the CT scan which means you may get dehydrated and you may have diarrhea.  Please drink plenty of clear liquids but only small amounts at a time.  If you should develop severe or worsening symptoms return to the ER immediately.  You must follow-up with your gastroenterologist within 1 week  Because of your history of allergies to antibiotics you do not qualify for any of the usual antibiotics for infectious colitis.  It is far more likely that this is inflammatory colitis which would be treated with steroids.  Take the steroids once a day for 5 days   Prednisone is a steroid that helps to reduce certain types of inflammation and may be used for allergic reactions, some rashes such as poison ivy or dermatitis, for asthma attacks or bronchitis and for certain types of pain.  Please take this medicine exactly as prescribed - '40mg'$  by mouth daily for 5 days.  This can have certain side effects with some people including feeling like you can't sleep, feeling anxious or feeling like you are on a "high".  It should not cause weight gain if only taken for a short time.  Please be aware that this medication may also cause an elevation in your blood sugar if you are a diabetic so if you are a diabetic you will need to keep a very close eye on your blood sugar, make sure that you are eating an extremely low level of carbohydrates and taking your medications exactly as prescribed.  If you should develop severe high blood sugar or start to feel poorly return to the emergency department immediately     Thank you for allowing [place] to treat you in the emergency department today.  After reviewing your examination and potential testing that was done it appears that  you are safe to go home.  I would like for you to follow-up with your doctor within the next several days, have them obtain your results and follow-up with them to review all of these tests.  If you should develop severe or worsening symptoms return to the emergency department immediately

## 2022-05-27 NOTE — ED Provider Notes (Signed)
[facility] Provider Note   CSN: [id number] Arrival date & time: 05/27/22  1904     History  No chief complaint on file.   [name] is a 52 y.o. female.  HPI   This patient is a 52 year old female, she has a history of some type of chronic abdominal pain for which she has had for approximately 20 years, I have reviewed the gastroenterology notes at length and obtain the information contained herein.  She has been seen by the GI service, has had a CT scan that showed some type of colitis or bowel inflammation but had a subsequent colonoscopy after taking some medication for colitis and the colonoscopy was pretty unremarkable.  Since then she has had waxing and waning abdominal pain with occasional nausea and vomiting, some chronic constipation for which she uses Linzess and is currently under the care of the GI service scheduled for a CT scan later this month.  She reports that she woke up this morning with increasing abdominal pain and persistent vomiting throughout the day.  She states it feels similar to what she had with colitis in the past.  She has not had any blood in the stools or the vomit, she has not had any fevers or chills or urinary symptoms.  She has been taking her daily medications including dicyclomine, Protonix but has not been having any significant improvement.  Home Medications Prior to Admission medications   Medication Sig Start Date End Date Taking? Authorizing Provider  busPIRone (BUSPAR) 5 MG tablet 1 tablet Orally Once a day 04/17/22   [provider]  dicyclomine (BENTYL) 20 MG tablet TAKE ONE TABLET BY MOUTH THREE TIMES DAILY WITH FOOD. {hold FOR constipation] 05/16/22   Sherron Monday, NP  famotidine (PEPCID) 20 MG tablet Take 1 tablet (20 mg total) by mouth 2 (two) times daily. Patient not taking: Reported on 05/16/2022 11/13/20   Orson Eva, MD  FLUoxetine (PROZAC) 10 MG capsule 1 capsule Orally Once a day for 30 days     [provider]  hydrOXYzine (ATARAX/VISTARIL) 50 MG tablet Take 50 mg by mouth every 6 (six) hours as needed for anxiety. 09/29/20   [provider]  linaclotide Rolan Lipa) 72 MCG capsule Take 1 capsule (72 mcg total) by mouth daily before breakfast. 05/16/22   Sherron Monday, NP  metoprolol succinate (TOPROL-XL) 100 MG 24 hr tablet Take 100 mg by mouth daily. Take with or immediately following a meal.    [provider]  ondansetron (ZOFRAN-ODT) 8 MG disintegrating tablet Take 1 tablet (8 mg total) by mouth every 8 (eight) hours. 05/16/22 07/15/22  Sherron Monday, NP  oxyCODONE-acetaminophen (PERCOCET/ROXICET) 5-325 MG tablet Take 1 tablet by mouth every 8 (eight) hours as needed for severe pain. 05/27/22  Yes Noemi Chapel, MD  pantoprazole (PROTONIX) 40 MG tablet TAKE (1) TABLET BY MOUTH TWICE A DAY BEFORE MEALS. (BREAKFAST AND SUPPER) 05/16/22   Sherron Monday, NP  predniSONE (DELTASONE) 20 MG tablet Take 2 tablets (40 mg total) by mouth daily. 05/27/22  Yes Noemi Chapel, MD  promethazine (PHENERGAN) 25 MG tablet Take 1 tablet (25 mg total) by mouth every 6 (six) hours as needed for nausea or vomiting. 05/27/22  Yes Noemi Chapel, MD  temazepam (RESTORIL) 30 MG capsule Take 30 mg by mouth at bedtime as needed for sleep.    [provider]  venlafaxine (EFFEXOR) 75 MG tablet Take 75 mg by mouth daily. Patient not taking: Reported  on 02/16/2022 06/21/21   [provider]  VRAYLAR 1.5 MG capsule Take 1.5 mg by mouth daily. 02/14/22   [provider]      Allergies    Buprenorphine, Penicillins, Aripiprazole, Seroquel [quetiapine], Cefaclor, Doxycycline, Gabapentin, Ibuprofen, Levofloxacin, Lisinopril, Macrobid [nitrofurantoin monohyd macro], and Naproxen    Review of Systems   Review of Systems  All other systems reviewed and are negative.   Physical Exam Updated Vital Signs BP 125/64   Pulse 75   Temp 98.5 F (36.9 C) (Oral)   Resp 15    Ht 1.549 m ('5\' 1"'$ )   Wt 50.8 kg   SpO2 93%   BMI 21.16 kg/m  Physical Exam Vitals and nursing note reviewed.  Constitutional:      General: She is not in acute distress.    Appearance: She is well-developed.  HENT:     Head: Normocephalic and atraumatic.     Mouth/Throat:     Mouth: Mucous membranes are dry.     Pharynx: No oropharyngeal exudate.  Eyes:     General: No scleral icterus.       Right eye: No discharge.        Left eye: No discharge.     Conjunctiva/sclera: Conjunctivae normal.     Pupils: Pupils are equal, round, and reactive to light.  Neck:     Thyroid: No thyromegaly.     Vascular: No JVD.  Cardiovascular:     Rate and Rhythm: Normal rate and regular rhythm.     Heart sounds: Normal heart sounds. No murmur heard.    No friction rub. No gallop.  Pulmonary:     Effort: Pulmonary effort is normal. No respiratory distress.     Breath sounds: Normal breath sounds. No wheezing or rales.  Abdominal:     General: Bowel sounds are normal. There is no distension.     Palpations: Abdomen is soft. There is no mass.     Tenderness: There is abdominal tenderness.     Comments: Diffuse abdominal tenderness, very soft, guarding present  Musculoskeletal:        General: No tenderness. Normal range of motion.     Cervical back: Normal range of motion and neck supple.     Right lower leg: No edema.     Left lower leg: No edema.  Lymphadenopathy:     Cervical: No cervical adenopathy.  Skin:    General: Skin is warm and dry.     Findings: No erythema or rash.  Neurological:     Mental Status: She is alert.     Coordination: Coordination normal.  Psychiatric:        Behavior: Behavior normal.     ED Results / Procedures / Treatments   Labs (all labs ordered are listed, but only abnormal results are displayed) Labs Reviewed  COMPREHENSIVE METABOLIC PANEL - Abnormal; Notable for the following components:      Result Value   Potassium 3.4 (*)    Glucose, Bld 139  (*)    Total Protein 8.6 (*)    Alkaline Phosphatase 170 (*)    All other components within normal limits  CBC - Abnormal; Notable for the following components:   WBC 10.9 (*)    RDW 15.9 (*)    Platelets 507 (*)    All other components within normal limits  LIPASE, BLOOD  URINALYSIS, ROUTINE W REFLEX MICROSCOPIC    EKG EKG Interpretation  Date/Time:  Sunday May 27 2022 19:30:51 EST  Ventricular Rate:  84 PR Interval:  132 QRS Duration: 72 QT Interval:  394 QTC Calculation: 465 R Axis:   91 Text Interpretation: Normal sinus rhythm Rightward axis Borderline ECG When compared with ECG of 18-Aug-2020 04:37, PREVIOUS ECG IS PRESENT Confirmed by Noemi Chapel [phone]) on 05/27/2022 7:44:39 PM  Radiology CT ABDOMEN PELVIS W CONTRAST  Result Date: 05/27/2022 CLINICAL DATA:  Nausea, vomiting, pain EXAM: CT ABDOMEN AND PELVIS WITH CONTRAST TECHNIQUE: Multidetector CT imaging of the abdomen and pelvis was performed using the standard protocol following bolus administration of intravenous contrast. RADIATION DOSE REDUCTION: This exam was performed according to the departmental dose-optimization program which includes automated exposure control, adjustment of the mA and/or kV according to patient size and/or use of iterative reconstruction technique. CONTRAST:  61m OMNIPAQUE IOHEXOL 300 MG/ML  SOLN COMPARISON:  11/11/2020, 12/23/2020 FINDINGS: Lower chest: No acute pleural or parenchymal lung disease. Hepatobiliary: No focal liver abnormality is seen. Status post cholecystectomy. No biliary dilatation. Pancreas: Unremarkable. No pancreatic ductal dilatation or surrounding inflammatory changes. Spleen: Normal in size without focal abnormality. Adrenals/Urinary Tract: Multiple simple left renal cortical cyst is not require follow-up. Multilobular cyst upper pole left kidney is unchanged measuring 4.2 x 2.8 cm. Please refer to previous follow-up recommendations from MRI 12/23/2020. Otherwise the  kidneys enhance normally. No urinary tract calculi or obstruction. The adrenals and bladder are unremarkable. Stomach/Bowel: No bowel obstruction or ileus. There is diffuse colonic wall thickening extending from the distal transverse colon through the rectosigmoid junction, consistent with inflammatory or infectious colitis. Normal appendix right lower quadrant. Vascular/Lymphatic: Aortic atherosclerosis. No enlarged abdominal or pelvic lymph nodes. Reproductive: Status post hysterectomy. No adnexal masses. Other: No free fluid or free intraperitoneal gas. No abdominal wall hernia. Musculoskeletal: No acute or destructive bony lesions. Reconstructed images demonstrate no additional findings. IMPRESSION: 1. Colonic wall thickening extending from the distal transverse colon through the rectosigmoid junction, consistent with inflammatory or infectious colitis. 2. Stable lobular left renal cyst. Please refer to previous MRI report 12/23/2020 recommending follow-up MRI which was due 12/2021. 3.  Aortic Atherosclerosis (ICD10-I70.0). Electronically Signed   By: MRanda NgoM.D.   On: 05/27/2022 21:22    Procedures Procedures    Medications Ordered in ED Medications  dexamethasone (DECADRON) injection 10 mg (has no administration in time range)  ondansetron (ZOFRAN-ODT) disintegrating tablet 4 mg (4 mg Oral Given 05/27/22 2029)  HYDROmorphone (DILAUDID) injection 1 mg (1 mg Intravenous Given 05/27/22 2028)  sodium chloride 0.9 % bolus 1,000 mL (1,000 mLs Intravenous New Bag/Given 05/27/22 2029)  metoprolol tartrate (LOPRESSOR) injection 10 mg (10 mg Intravenous Given 05/27/22 2120)  iohexol (OMNIPAQUE) 300 MG/ML solution 100 mL (80 mLs Intravenous Contrast Given 05/27/22 2101)    ED Course/ Medical Decision Making/ A&P                             Medical Decision Making Amount and/or Complexity of Data Reviewed Labs: ordered. Radiology: ordered.  Risk Prescription drug management.   This patient  presents to the ED for concern of abdominal pain nausea and vomiting, this involves an extensive number of treatment options, and is a complaint that carries with it a high risk of complications and morbidity.  The differential diagnosis includes colitis, obstruction, she has had a gallbladder removal in the past as well as a hysterectomy but still has an appendix.  Less likely to be ovarian cysts or torsion, could be aneurysm, pancreatitis, peptic ulcer  disease, volvulus   Co morbidities that complicate the patient evaluation  Chronic abdominal pain Chronic constipation History of possible colitis   Additional history obtained:  Additional history obtained from electronic record External records from outside source obtained and reviewed including multiple prior studies including CT scans and GI notes   Lab Tests:  I Ordered, and personally interpreted labs.  The pertinent results include: Metabolic panel with a potassium 3.4 glucose of 139 lipase of 31 and a CBC which shows essentially no leukocytosis or anemia.  Platelets are slightly elevated   Imaging Studies ordered:  I ordered imaging studies including CT scan of the abdomen and pelvis I independently visualized and interpreted imaging which showed acute colitis of the descending and transverse colon's I agree with the radiologist interpretation   Cardiac Monitoring: / EKG:  The patient was maintained on a cardiac monitor.  I personally viewed and interpreted the cardiac monitored which showed an underlying rhythm of: Normal sinus rhythm, there is no tachycardia at the time of discharge rate of 75   Consultations Obtained:  The patient has outpatient gastroenterology follow-up   Problem List / ED Course / Critical interventions / Medication management  The patient improved with IV fluids, antiemetics, pain medications, I think that this is most likely going to end up being inflammatory colitis I ordered medication  including dexamethasone for inflammatory colitis Reevaluation of the patient after these medicines showed that the patient improved significantly I have reviewed the patients home medicines and have made adjustments as needed   Social Determinants of Health:  The patient has some element of inflammatory bowel disease and will need to follow-up closely   Test / Admission - Considered:  Considered admission but the patient has no tachycardia no hypotension no fever and is tolerating fluids prior to discharge, given 1 L of IV fluids along with medications and has done remarkably well.  She is still symptomatic but significantly better, additionally she has no surgical findings on CT scan The patient was informed of her results and is agreeable to the treatment plan         Final Clinical Impression(s) / ED Diagnoses Final diagnoses:  Colitis    Rx / DC Orders ED Discharge Orders          Ordered    predniSONE (DELTASONE) 20 MG tablet  Daily        05/27/22 2241    oxyCODONE-acetaminophen (PERCOCET/ROXICET) 5-325 MG tablet  Every 8 hours PRN        05/27/22 2241    promethazine (PHENERGAN) 25 MG tablet  Every 6 hours PRN        05/27/22 2241              Noemi Chapel, MD 05/27/22 2244

## 2022-05-27 NOTE — ED Triage Notes (Signed)
Pt from home. C/o colitis s/s. Pt has a hx of the same. Pt does see Dr. Abbey Chatters, GI specialist. Pt is nauseated/emesis with pain since this morning. Denies fever, diarrhea.

## 2022-05-30 ENCOUNTER — Other Ambulatory Visit: Payer: Self-pay | Admitting: Gastroenterology

## 2022-05-30 ENCOUNTER — Telehealth: Payer: Self-pay | Admitting: Gastroenterology

## 2022-05-30 DIAGNOSIS — R112 Nausea with vomiting, unspecified: Secondary | ICD-10-CM

## 2022-05-30 MED ORDER — PROMETHAZINE HCL 25 MG PO TABS: 12.5000 mg | ORAL_TABLET | Freq: Four times a day (QID) | ORAL | 0 refills | Status: DC | PRN

## 2022-05-30 NOTE — Telephone Encounter (Signed)
Spoke to pt, informed her that a limited amount of Phenergan was sent to pharmacy. Pt voiced understanding. She asked what time her appointment is for tomorrow. I informed her that it is 3:30,  1/18. She states she was told 9:30, 1/18. I double checked the appointment with the front desk staff and it is 3:30. Informed her again of the time.

## 2022-05-30 NOTE — Telephone Encounter (Signed)
Patient called and said she needed to make a follow up appt because she was seen last weekend in the ED.  She has an appointment with CM tomorrow but the patient was asking if Loma Sousa could call her in more Phenegran.  She said the hospital only gave her a few.

## 2022-05-30 NOTE — Telephone Encounter (Signed)
Patient stated she was only given Phenergan from ED visit to last about 3 days.  States she is currently out of medication and unable to eat without it.  Zofran is not really working and Phenergan seems to work much better for her to build to eat.    Phenergan 25 mg tablets sent to pharmacy. She should only take 1/2 tablet up to every 6 hours for nausea.  Recommendations for no more than 50 mg total in 1 day especially long-term use.  Therefore she needs to understand she should only take half of a tablet up to every 6 hours.  Will revisit symptoms at visit tomorrow. Discussing further with Dr. Abbey Chatters.   Venetia Night, MSN, APRN, FNP-BC, AGACNP-BC [facility] at [facility]

## 2022-05-30 NOTE — Telephone Encounter (Signed)
See previous note

## 2022-05-30 NOTE — Progress Notes (Deleted)
GI Office Note    Referring Provider: Vidal Schwalbe, MD Primary Care Physician:  Vidal Schwalbe, MD Primary Gastroenterologist: Elon Alas. Abbey Chatters, DO  Date:  05/30/2022  ID:  [name], DOB 1971/02/11, MRN [id number]   Chief Complaint   No chief complaint on file.   History of Present Illness  [name] is a 52 y.o. female with a history of GERD, Candida esophagitis in February 2023 treated with Diflucan, chronic nausea, chronic constipation, colitis on CT in April 2022 treated empirically for IBS/suspected Crohn's with prednisone and Entocort with follow-up colonoscopy in February 2023 normal other than nonbleeding internal hemorrhoids and normal colon biopsies, chronic abdominal pain*** presenting today for follow-up post ED visit.  EGD February 2023: -Candida esophagitis -Gastritis -Treated with Diflucan  Virtual visit October 2023.  Report improvement with GERD with twice daily dosing of pantoprazole.  Ongoing nausea, trying to eat small meals.  Not much of an appetite only eating 2 good meals a day.  Using Zofran on a daily basis.  Continues to have postprandial abdominal pain and taking dicyclomine 3 times daily.  Weight stable.  Taking Linzess 145 mcg a couple times per week with bowel movement every 2-3 days.  Unable to tolerate daily dosing given diarrhea.  Has occasional lower abdominal cramping that improves with defecation.  GES ordered to be done off dicyclomine.  Advised PPI twice daily and dicyclomine 3 times daily and continue Zofran as needed.  Gastroparesis precautions advised and to trial Linzess 72 mcg daily.  GES normal 02/22/2022. (Scheduled for CT angio of abdomen pelvis to rule out mesenteric ischemia but was not scheduled until 06/05/2022.  Last visit completed virtually 05/16/2022.  Continues to express no appetite but reflux controlled with pantoprazole.  Has nausea on daily basis with abdominal cramping.  Taking dicyclomine 20 mg 3 times daily and  Zofran 8 mg prior to each meal.  Has constant nausea and she does not take medicine that she vomits uncontrollably which resulted in dehydration.  Reports that Cyclomen controls abdominal pain very well and able to eat when she takes it.  Patient had question about Librax which she used in the past.  Weight stable.  At times will drink a protein shake if unable to eat well.  Linzess 72 mcg helpful.  Having bowel movement every day that is soft and not having to strain.  Improvement in bowels has not improved abdominal pain.  Advised to continue Linzess 72 mcg daily, pantoprazole 40 mg twice daily, dicyclomine 3 times daily, Zofran 8 mg 3 times daily.  Advise she may use Phenergan 6.25 mg for breakthrough.  GERD and gastroparesis recommendations given.  ED visit 05/27/2022.  Patient reported she woke up that day with increased abdominal pain and persistent vomiting throughout the day very similar to when she had colitis previously.  Denied any melena, hematochezia, or hematemesis.  No fever or chills.  Labs performed normal lipase.  Potassium 3.4, glucose 139, alk phos elevated at 170.  Normal LFTs and T. bili.  Normal renal function.  WBC 10.9.  Hemoglobin normal.  Platelets slightly elevated at 507 (previously elevated at 425 in July 2022).  CT A/P as outlined below.  CT A/P 05/27/2022: -S/p cholecystectomy, no biliary dilation or liver abnormality -Unremarkable spleen and pancreas -Multiple simple left renal cortical cyst -Multilobular cyst in upper pole of left kidney unchanged (follow-up MRI recommended) -No bowel obstruction or ileus -Diffuse colonic wall thickening extending from distal transverse colon through the rectosigmoid junction,  consistent with inflammatory or infectious colitis  Short Phenergan refill with 12 tablets of 25 mg each provided on 05/30/2022.  Patient is to only take 1/2 tablet up to every 6 hours as needed for nausea.   Today:   Current Outpatient Medications  Medication  Sig Dispense Refill   busPIRone (BUSPAR) 5 MG tablet 1 tablet Orally Once a day     dicyclomine (BENTYL) 20 MG tablet TAKE ONE TABLET BY MOUTH THREE TIMES DAILY WITH FOOD. {hold FOR constipation] 90 tablet 3   famotidine (PEPCID) 20 MG tablet Take 1 tablet (20 mg total) by mouth 2 (two) times daily. (Patient not taking: Reported on 05/16/2022) 60 tablet 1   FLUoxetine (PROZAC) 10 MG capsule 1 capsule Orally Once a day for 30 days     hydrOXYzine (ATARAX/VISTARIL) 50 MG tablet Take 50 mg by mouth every 6 (six) hours as needed for anxiety.     linaclotide (LINZESS) 72 MCG capsule Take 1 capsule (72 mcg total) by mouth daily before breakfast. 30 capsule 5   metoprolol succinate (TOPROL-XL) 100 MG 24 hr tablet Take 100 mg by mouth daily. Take with or immediately following a meal.     ondansetron (ZOFRAN-ODT) 8 MG disintegrating tablet Take 1 tablet (8 mg total) by mouth every 8 (eight) hours. 90 tablet 1   oxyCODONE-acetaminophen (PERCOCET/ROXICET) 5-325 MG tablet Take 1 tablet by mouth every 8 (eight) hours as needed for severe pain. 10 tablet 0   pantoprazole (PROTONIX) 40 MG tablet TAKE (1) TABLET BY MOUTH TWICE A DAY BEFORE MEALS. (BREAKFAST AND SUPPER) 60 tablet 5   predniSONE (DELTASONE) 20 MG tablet Take 2 tablets (40 mg total) by mouth daily. 10 tablet 0   promethazine (PHENERGAN) 25 MG tablet Take 1 tablet (25 mg total) by mouth every 6 (six) hours as needed for nausea or vomiting. 12 tablet 0   temazepam (RESTORIL) 30 MG capsule Take 30 mg by mouth at bedtime as needed for sleep.     venlafaxine (EFFEXOR) 75 MG tablet Take 75 mg by mouth daily. (Patient not taking: Reported on 02/16/2022)     VRAYLAR 1.5 MG capsule Take 1.5 mg by mouth daily.     No current facility-administered medications for this visit.    Past Medical History:  Diagnosis Date   Chronic abdominal pain    Chronic back pain    Colitis    Constipation    GERD (gastroesophageal reflux disease)    Hypertension     Migraine    Plumbism    blood clot    PONV (postoperative nausea and vomiting)    Renal mass    Uterine cancer [facility])    age 48    Past Surgical History:  Procedure Laterality Date   ABDOMINAL HYSTERECTOMY     BACK SURGERY     BIOPSY  07/07/2021   Procedure: BIOPSY;  Surgeon: Eloise Harman, DO;  Location: [facility];  Service: Endoscopy;;   CHOLECYSTECTOMY     Complicated by bile leak requiring ERCP with temporary stenting   COLONOSCOPY WITH ESOPHAGOGASTRODUODENOSCOPY (EGD)  07/2009   [facility]: Op notes cannot be seen through care everywhere however pathology showed terminal ileum and random colon biopsies normal.  Stomach biopsy with gastric antral and fundic mucosa with reactive foveolar hyperplasia.  No active gastritis.  Stains negative for H. pylori.   COLONOSCOPY WITH PROPOFOL N/A 07/07/2021   Surgeon: Hurshel Keys K, DO;  Non-bleeding internal hemorrhoids, normal TI  biopsied, normal colon biopsied.  Pathology with colon and TI biopsies normal.   ESOPHAGEAL BRUSHING  07/07/2021   Procedure: ESOPHAGEAL BRUSHING;  Surgeon: Eloise Harman, DO;  Location: [facility];  Service: Endoscopy;;   ESOPHAGOGASTRODUODENOSCOPY  06/2007   [facility]: Normal esophagus, gastric mucosal abnormality with erythema, biopsy showed gastric antral and fundic mucosa with reactive foveolar hyperplasia, no gastritis, no H. pylori.   ESOPHAGOGASTRODUODENOSCOPY (EGD) WITH PROPOFOL N/A 07/07/2021   Surgeon: Eloise Harman, DO; moderately severe Candida esophagitis, gastritis, biopsies negative for H. pylori, normal examined duodenum.  Prescribed Diflucan and recommended PPI twice daily.   HAND SURGERY      Family History  Problem Relation Age of Onset   Cancer Other    Seizures Other    Stroke Other    Diabetes Other     Allergies as of 05/31/2022 - Review Complete 05/27/2022  Allergen Reaction Noted   Buprenorphine Other (See Comments) 01/24/2016   Penicillins Swelling and Rash  07/08/2011   Aripiprazole Other (See Comments) 08/01/2020   Seroquel [quetiapine] Other (See Comments) 08/18/2020   Cefaclor Other (See Comments)    Doxycycline Rash 08/01/2020   Gabapentin Nausea And Vomiting 01/24/2016   Ibuprofen Swelling and Rash 07/08/2011   Levofloxacin Rash 01/24/2016   Lisinopril Nausea And Vomiting 08/01/2020   Macrobid [nitrofurantoin monohyd macro] Rash 11/30/2014   Naproxen Swelling and Rash 07/08/2011    Social History   Socioeconomic History   Marital status: Married    Spouse name: benito   Number of children: 2   Years of education: Not on file   Highest education level: Associate degree: occupational, Hotel manager, or vocational program  Occupational History    Comment: not employed  Tobacco Use   Smoking status: Every Day    Packs/day: 1.00    Years: 20.00    Total pack years: 20.00    Types: Cigarettes, E-cigarettes   Smokeless tobacco: Never  Vaping Use   Vaping Use: Never used  Substance and Sexual Activity   Alcohol use: No   Drug use: No   Sexual activity: Yes    Birth control/protection: Surgical  Other Topics Concern   Not on file  Social History Narrative   Not on file   Social Determinants of Health   Financial Resource Strain: Low Risk  (03/19/2017)   Overall Financial Resource Strain (CARDIA)    Difficulty of Paying Living Expenses: Not very hard  Food Insecurity: No Food Insecurity (03/19/2017)   Hunger Vital Sign    Worried About Running Out of Food in the Last Year: Never true    [place] in the Last Year: Never true  Transportation [name]: No Transportation [name] (03/19/2017)   PRAPARE - Hydrologist (Medical): No    Lack of Transportation (Non-Medical): No  Physical Activity: Inactive (03/19/2017)   Exercise Vital Sign    Days of Exercise per Week: 0 days    Minutes of Exercise per Session: 0 min  Stress: Stress Concern Present (03/19/2017)   [place]    Feeling of Stress : To some extent  Social Connections: Somewhat Isolated (03/19/2017)   Social Connection and Isolation Panel [NHANES]    Frequency of Communication with Friends and Family: More than three times a week    Frequency of Social Gatherings with Friends and Family: Twice a week    Attends Religious Services: Never    Marine scientist or Organizations: No  Attends Archivist Meetings: Never    Marital Status: Married     Review of Systems   Gen: Denies fever, chills, anorexia. Denies fatigue, weakness, weight loss.  CV: Denies chest pain, palpitations, syncope, peripheral edema, and claudication. Resp: Denies dyspnea at rest, cough, wheezing, coughing up blood, and pleurisy. GI: See HPI Derm: Denies rash, itching, dry skin Psych: Denies depression, anxiety, memory loss, confusion. No homicidal or suicidal ideation.  Heme: Denies bruising, bleeding, and enlarged lymph nodes.   Physical Exam   There were no vitals taken for this visit.  General:   Alert and oriented. No distress noted. Pleasant and cooperative.  Head:  Normocephalic and atraumatic. Eyes:  Conjuctiva clear without scleral icterus. Mouth:  Oral mucosa pink and moist. Good dentition. No lesions. Lungs:  Clear to auscultation bilaterally. No wheezes, rales, or rhonchi. No distress.  Heart:  S1, S2 present without murmurs appreciated.  Abdomen:  +BS, soft, non-tender and non-distended. No rebound or guarding. No HSM or masses noted. Rectal: *** Msk:  Symmetrical without gross deformities. Normal posture. Extremities:  Without edema. Neurologic:  Alert and  oriented x4 Psych:  Alert and cooperative. Normal mood and affect.   Assessment  [name] is a 52 y.o. female with a history of GERD, Candida esophagitis in February 2023 treated with Diflucan, chronic nausea, chronic constipation, colitis on CT in April 2022 treated empirically for  IBS/suspected Crohn's with prednisone and Entocort with follow-up colonoscopy in February 2023 normal other than nonbleeding internal hemorrhoids and normal colon biopsies, chronic abdominal pain*** presenting today for follow-up post ED visit.  Colitis:   Nausea/vomiting/early satiety:   Chronic abdominal pain:   Constipation:    PLAN   ***     Venetia Night, MSN, FNP-BC, AGACNP-BC [facility]

## 2022-05-30 NOTE — Progress Notes (Signed)
Phenergan 25 mg tablets sent to pharmacy. She should only take 0.5 tablet up to every 6 hours for nausea. Will revisit symptoms at visit tomorrow. Discussing further with Dr. Abbey Chatters.   Venetia Night, MSN, APRN, FNP-BC, AGACNP-BC [facility]

## 2022-05-31 ENCOUNTER — Inpatient Hospital Stay: Payer: Medicaid Other | Admitting: Gastroenterology

## 2022-06-05 ENCOUNTER — Ambulatory Visit (HOSPITAL_COMMUNITY)
Admission: RE | Admit: 2022-06-05 | Discharge: 2022-06-05 | Disposition: A | Discharge status: Home / Self Care | Payer: Medicaid Other | Source: Ambulatory Visit | Attending: Gastroenterology | Admitting: Gastroenterology

## 2022-06-05 ENCOUNTER — Encounter (HOSPITAL_COMMUNITY): Payer: Self-pay | Admitting: Radiology

## 2022-06-05 DIAGNOSIS — R1084 Generalized abdominal pain: Secondary | ICD-10-CM | POA: Insufficient documentation

## 2022-06-05 DIAGNOSIS — Z9049 Acquired absence of other specified parts of digestive tract: Secondary | ICD-10-CM | POA: Diagnosis not present

## 2022-06-05 DIAGNOSIS — J9811 Atelectasis: Secondary | ICD-10-CM | POA: Diagnosis not present

## 2022-06-05 DIAGNOSIS — N281 Cyst of kidney, acquired: Secondary | ICD-10-CM | POA: Diagnosis not present

## 2022-06-05 DIAGNOSIS — R112 Nausea with vomiting, unspecified: Secondary | ICD-10-CM | POA: Diagnosis not present

## 2022-06-05 MED ORDER — IOHEXOL 350 MG/ML SOLN
100.0000 mL | Freq: Once | INTRAVENOUS | Status: AC | PRN
  Administered 2022-06-05: 100 mL via INTRAVENOUS

## 2022-06-06 NOTE — Progress Notes (Unsigned)
GI Office Note    Referring Provider: Vidal Schwalbe, MD Primary Care Physician:  Vidal Schwalbe, MD Primary Gastroenterologist: Elon Alas. Abbey Chatters, DO  Date:  06/12/2022  ID:  [name], DOB 10/12/1970, MRN [id number]   Chief Complaint   Chief Complaint  Patient presents with   [facility] follow up.     History of Present Illness  [name] is a 52 y.o. female with a history of GERD, Candida esophagitis in February 2023 treated with Diflucan, chronic nausea, chronic constipation, colitis on CT in April 2022 treated empirically for IBS/suspected Crohn's with prednisone and Entocort with follow-up colonoscopy in February 2023 normal other than nonbleeding internal hemorrhoids and normal colon biopsies, chronic abdominal pain*** presenting today for follow-up post ED visit.   EGD February 2023: -Candida esophagitis -Gastritis -Treated with Diflucan   Virtual visit October 2023.  Report improvement with GERD with twice daily dosing of pantoprazole.  Ongoing nausea, trying to eat small meals.  Not much of an appetite only eating 2 good meals a day.  Using Zofran on a daily basis.  Continues to have postprandial abdominal pain and taking dicyclomine 3 times daily.  Weight stable.  Taking Linzess 145 mcg a couple times per week with bowel movement every 2-3 days.  Unable to tolerate daily dosing given diarrhea.  Has occasional lower abdominal cramping that improves with defecation.  GES ordered to be done off dicyclomine.  Advised PPI twice daily and dicyclomine 3 times daily and continue Zofran as needed.  Gastroparesis precautions advised and to trial Linzess 72 mcg daily.   GES normal 02/22/2022. (Scheduled for CT angio of abdomen pelvis to rule out mesenteric ischemia but was not scheduled until 06/05/2022.   Last visit completed virtually 05/16/2022.  Continues to express no appetite but reflux controlled with pantoprazole.  Has nausea on daily basis with abdominal  cramping.  Taking dicyclomine 20 mg 3 times daily and Zofran 8 mg prior to each meal.  Has constant nausea and she does not take medicine that she vomits uncontrollably which resulted in dehydration.  Reports that Cyclomen controls abdominal pain very well and able to eat when she takes it.  Patient had question about Librax which she used in the past.  Weight stable.  At times will drink a protein shake if unable to eat well.  Linzess 72 mcg helpful.  Having bowel movement every day that is soft and not having to strain.  Improvement in bowels has not improved abdominal pain.  Advised to continue Linzess 72 mcg daily, pantoprazole 40 mg twice daily, dicyclomine 3 times daily, Zofran 8 mg 3 times daily.  Advise she may use Phenergan 6.25 mg for breakthrough.  GERD and gastroparesis recommendations given.   ED visit 05/27/2022.  Patient reported she woke up that day with increased abdominal pain and persistent vomiting throughout the day very similar to when she had colitis previously.  Denied any melena, hematochezia, or hematemesis.  No fever or chills.  Labs performed normal lipase.  Potassium 3.4, glucose 139, alk phos elevated at 170.  Normal LFTs and T. bili.  Normal renal function.  WBC 10.9.  Hemoglobin normal.  Platelets slightly elevated at 507 (previously elevated at 425 in July 2022).  CT A/P as outlined below.   CT A/P 05/27/2022: -S/p cholecystectomy, no biliary dilation or liver abnormality -Unremarkable spleen and pancreas -Multiple simple left renal cortical cyst -Multilobular cyst in upper pole of left kidney unchanged (follow-up MRI recommended) -  No bowel obstruction or ileus -Diffuse colonic wall thickening extending from distal transverse colon through the rectosigmoid junction, consistent with inflammatory or infectious colitis   Short Phenergan refill with 12 tablets of 25 mg each provided on 05/30/2022.  Patient is to only take 1/2 tablet up to every 6 hours as needed for nausea.    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  Currently overdue   Today:  Phenergan will help her eat but is only using that when the zofran is ineffective. Reports ED visit since last visit here. Completed 5 days of prednisone and tapered off but reports she is still having some of those symptoms just not vomiting now.  Having upper and lower abdominal pain. No fevers. Loss of appetite, nausea and vomiting are her most severe symptoms. Weight has been stable.  Prednisone helped appetite some.   No melena or BRBPR,  constipation or diarrhea.   Did not feel like reglan worked well for her in the hospital.   No prior issues with cipro in regards to antibiotics. Does have allergy to levaquin.    Current Outpatient Medications  Medication Sig Dispense Refill   busPIRone (BUSPAR) 5 MG tablet 1 tablet Orally Once a day     dicyclomine (BENTYL) 20 MG tablet TAKE ONE TABLET BY MOUTH THREE TIMES DAILY WITH FOOD. {hold FOR constipation] 90 tablet 3   FLUoxetine (PROZAC) 10 MG capsule 1 capsule Orally Once a day for 30 days     hydrOXYzine (ATARAX/VISTARIL) 50 MG tablet Take 50 mg by mouth every 6 (six) hours as needed for anxiety.     linaclotide (LINZESS) 72 MCG capsule Take 1 capsule (72 mcg total) by mouth daily before breakfast. 30 capsule 5   metoprolol succinate (TOPROL-XL) 100 MG 24 hr tablet Take 100 mg by mouth daily. Take with or immediately following a meal.     ondansetron (ZOFRAN-ODT) 8 MG disintegrating tablet Take 1 tablet (8 mg total) by mouth every 8 (eight) hours. 90 tablet 1   pantoprazole (PROTONIX) 40 MG tablet TAKE (1) TABLET BY MOUTH TWICE A DAY BEFORE MEALS.  (BREAKFAST AND SUPPER) 60 tablet 5   temazepam (RESTORIL) 30 MG capsule Take 30 mg by mouth at bedtime as needed for sleep.     VRAYLAR 1.5 MG capsule Take 1.5 mg by mouth daily.     famotidine (PEPCID) 20 MG tablet Take 1 tablet (20 mg total) by mouth 2 (two) times daily. (Patient not taking: Reported on 05/16/2022) 60 tablet 1   oxyCODONE-acetaminophen (PERCOCET/ROXICET) 5-325 MG tablet Take 1 tablet by mouth every 8 (eight) hours as needed for severe pain. (Patient not taking: Reported on 06/12/2022) 10 tablet 0   predniSONE (DELTASONE) 20 MG tablet Take 2 tablets (40 mg total) by mouth daily. (Patient not taking: Reported on 06/12/2022) 10 tablet 0   promethazine (PHENERGAN) 25 MG tablet Take 0.5 tablets (12.5 mg total) by mouth every 6 (six) hours as needed for nausea or vomiting. (Patient not taking: Reported on 06/12/2022) 12 tablet 0   venlafaxine (EFFEXOR) 75 MG tablet Take 75 mg by mouth daily. (Patient not taking: Reported on 02/16/2022)     No current facility-administered medications for this visit.    Past Medical History:  Diagnosis Date   Chronic abdominal pain    Chronic back pain    Colitis    Constipation    GERD (gastroesophageal reflux disease)    Hypertension    Migraine    Plumbism    blood clot    PONV (postoperative nausea and vomiting)    Renal mass    Uterine cancer [facility])    age 1    Past Surgical History:  Procedure Laterality Date   ABDOMINAL HYSTERECTOMY     BACK SURGERY     BIOPSY  07/07/2021   Procedure: BIOPSY;  Surgeon: Eloise Harman, DO;  Location: [facility];  Service: Endoscopy;;   CHOLECYSTECTOMY     Complicated by bile leak requiring ERCP with temporary stenting   COLONOSCOPY WITH ESOPHAGOGASTRODUODENOSCOPY (EGD)  07/2009   [facility]: Op notes cannot be seen through care everywhere however pathology showed terminal ileum and random colon biopsies normal.  Stomach biopsy with gastric antral and fundic mucosa with reactive foveolar  hyperplasia.  No active gastritis.  Stains negative for H. pylori.   COLONOSCOPY WITH PROPOFOL N/A 07/07/2021   Surgeon: Hurshel Keys K, DO;  Non-bleeding internal hemorrhoids, normal TI  biopsied, normal colon biopsied. Pathology with colon and TI biopsies normal.   ESOPHAGEAL BRUSHING  07/07/2021   Procedure: ESOPHAGEAL BRUSHING;  Surgeon: Eloise Harman, DO;  Location: [facility];  Service: Endoscopy;;   ESOPHAGOGASTRODUODENOSCOPY  06/2007   [facility]: Normal esophagus, gastric mucosal abnormality with erythema, biopsy showed gastric antral and fundic mucosa with reactive foveolar hyperplasia, no gastritis, no H. pylori.   ESOPHAGOGASTRODUODENOSCOPY (EGD) WITH PROPOFOL N/A 07/07/2021   Surgeon: Eloise Harman, DO; moderately severe Candida esophagitis, gastritis, biopsies negative for H. pylori, normal examined duodenum.  Prescribed Diflucan and recommended PPI twice daily.   HAND SURGERY      Family History  Problem Relation Age of Onset   Cancer Other    Seizures Other    Stroke Other    Diabetes Other     Allergies as of 06/12/2022 - Review Complete 06/12/2022  Allergen Reaction Noted   Buprenorphine Other (See Comments) 01/24/2016   Penicillins Swelling and Rash 07/08/2011   Aripiprazole Other (See Comments) 08/01/2020   Seroquel [quetiapine] Other (See Comments) 08/18/2020   Cefaclor Other (See Comments)    Doxycycline Rash 08/01/2020   Gabapentin Nausea And Vomiting 01/24/2016   Ibuprofen Swelling and Rash 07/08/2011   Levofloxacin Rash 01/24/2016   Lisinopril Nausea And Vomiting 08/01/2020   Macrobid [nitrofurantoin monohyd macro] Rash 11/30/2014   Naproxen Swelling and Rash 07/08/2011    Social History   Socioeconomic History   Marital status: Married    Spouse name: benito   Number of children: 2   Years of education: Not on file   Highest education level: Associate degree: occupational, Hotel manager, or vocational program  Occupational History     Comment: not employed  Tobacco Use   Smoking status: Every Day    Packs/day: 1.00    Years: 20.00    Total pack years: 20.00    Types: Cigarettes, E-cigarettes   Smokeless tobacco: Never  Vaping Use   Vaping Use: Never used  Substance and Sexual Activity   Alcohol use: No   Drug use: No   Sexual activity: Yes    Birth control/protection: Surgical  Other Topics Concern   Not on file  Social History Narrative   Not on file   Social Determinants of Health   Financial Resource Strain:  Low Risk  (03/19/2017)   Overall Financial Resource Strain (CARDIA)    Difficulty of Paying Living Expenses: Not very hard  Food Insecurity: No Food Insecurity (03/19/2017)   Hunger Vital Sign    Worried About Running Out of Food in the Last Year: Never true    Ran Out of Food in the Last Year: Never true  Transportation Needs: No Transportation Needs (03/19/2017)   PRAPARE - Hydrologist (Medical): No    Lack of Transportation (Non-Medical): No  Physical Activity: Inactive (03/19/2017)   Exercise Vital Sign    Days of Exercise per Week: 0 days    Minutes of Exercise per Session: 0 min  Stress: Stress Concern Present (03/19/2017)   [place]    Feeling of Stress : To some extent  Social Connections: Somewhat Isolated (03/19/2017)   Social Connection and Isolation Panel [NHANES]    Frequency of Communication with Friends and Family: More than three times a week    Frequency of Social Gatherings with Friends and Family: Twice a week    Attends Religious Services: Never    Marine scientist or Organizations: No    Attends Music therapist: Never    Marital Status: Married     Review of Systems   Gen: Denies fever, chills, anorexia. Denies fatigue, weakness, weight loss.  CV: Denies chest pain, palpitations, syncope, peripheral edema, and claudication. Resp: Denies dyspnea at rest,  cough, wheezing, coughing up blood, and pleurisy. GI: See HPI Derm: Denies rash, itching, dry skin Psych: Denies depression, anxiety, memory loss, confusion. No homicidal or suicidal ideation.  Heme: Denies bruising, bleeding, and enlarged lymph nodes.   Physical Exam   BP (!) 140/70 (BP Location: Right Arm, Patient Position: Sitting, Cuff Size: Normal)   Pulse 73   Temp (!) 97.5 F (36.4 C) (Temporal)   Ht '5\' 1"'$  (1.549 m)   Wt 118 lb 3.2 oz (53.6 kg)   SpO2 99%   BMI 22.33 kg/m   General:   Alert and oriented. No distress noted. Pleasant and cooperative.  Head:  Normocephalic and atraumatic. Eyes:  Conjuctiva clear without scleral icterus. Mouth:  Oral mucosa pink and moist. Good dentition. No lesions. Lungs:  Clear to auscultation bilaterally. No wheezes, rales, or rhonchi. No distress.  Heart:  S1, S2 present without murmurs appreciated.  Abdomen:  +BS, soft, non-tender and non-distended. No rebound or guarding. No HSM or masses noted. Rectal: *** Msk:  Symmetrical without gross deformities. Normal posture. Extremities:  Without edema. Neurologic:  Alert and  oriented x4 Psych:  Alert and cooperative. Normal mood and affect.   Assessment  [name] is a 52 y.o. female with a history of GERD, Candida esophagitis in February 2023 treated with Diflucan, chronic nausea, chronic constipation, colitis on CT in April 2022 treated empirically for IBS/suspected Crohn's with prednisone and Entocort with follow-up colonoscopy in February 2023 normal other than nonbleeding internal hemorrhoids and normal colon biopsies, chronic abdominal pain*** presenting today for follow-up post ED visit.   Colitis:    Nausea/vomiting/early satiety:    Chronic abdominal pain:    Constipation:    PLAN   *** Continue Linzess 72 mcg daily. Continue pantoprazole 40 mg BID Continue dicyclomine 20 mg TID with meals Phenergan as needed for breakthrough between zofran, refilled today Zofran  8 mg every 8 hours as needed.  Gastroparesis diet Follow-up with primary care regarding MRI surveillance  for left renal cysts Follow up with Dr. Abbey Chatters in 4-6 weeks  Discuss appetite stimulation and librax with Dr. Nino Glow, MSN, FNP-BC, AGACNP-BC [facility]

## 2022-06-12 ENCOUNTER — Ambulatory Visit (INDEPENDENT_AMBULATORY_CARE_PROVIDER_SITE_OTHER): Payer: Medicaid Other | Admitting: Gastroenterology

## 2022-06-12 ENCOUNTER — Encounter: Payer: Self-pay | Admitting: Gastroenterology

## 2022-06-12 VITALS — BP 140/70 | HR 73 | Temp 97.5°F | Ht 61.0 in | Wt 118.2 lb

## 2022-06-12 DIAGNOSIS — R1013 Epigastric pain: Secondary | ICD-10-CM | POA: Diagnosis not present

## 2022-06-12 DIAGNOSIS — K529 Noninfective gastroenteritis and colitis, unspecified: Secondary | ICD-10-CM

## 2022-06-12 DIAGNOSIS — R6881 Early satiety: Secondary | ICD-10-CM

## 2022-06-12 DIAGNOSIS — K5903 Drug induced constipation: Secondary | ICD-10-CM | POA: Diagnosis not present

## 2022-06-12 DIAGNOSIS — G8929 Other chronic pain: Secondary | ICD-10-CM

## 2022-06-12 DIAGNOSIS — F329 Major depressive disorder, single episode, unspecified: Secondary | ICD-10-CM | POA: Diagnosis not present

## 2022-06-12 DIAGNOSIS — K219 Gastro-esophageal reflux disease without esophagitis: Secondary | ICD-10-CM

## 2022-06-12 DIAGNOSIS — R112 Nausea with vomiting, unspecified: Secondary | ICD-10-CM

## 2022-06-12 DIAGNOSIS — F411 Generalized anxiety disorder: Secondary | ICD-10-CM | POA: Diagnosis not present

## 2022-06-12 MED ORDER — PROMETHAZINE HCL 25 MG PO TABS: 12.5000 mg | ORAL_TABLET | Freq: Four times a day (QID) | ORAL | 0 refills | Status: DC | PRN

## 2022-06-12 MED ORDER — CIPROFLOXACIN HCL 500 MG PO TABS: 500.0000 mg | ORAL_TABLET | Freq: Two times a day (BID) | ORAL | 0 refills | Status: AC

## 2022-06-12 NOTE — Patient Instructions (Addendum)
I have sent in Cipro given your lower abdominal pain and tenderness. It is a short 5 day course.  If any issues regarding rash or possible allergic reaction please contact the office and let me know.  I have sent in refills for your Phenergan and you have some Zofran refills on file.  As we discussed I will discuss her case further with Dr. Abbey Chatters regarding your appetite and possible use of Librax in place of dicyclomine.  Gastroparesis recommendations:  4-6 small meals daily Low fat diet Low fiber diet (avoid raw fruits and vegetables).  Continue pantoprazole twice daily and dicyclomine 20 g 3 times daily with meals.  Also continue your Linzess 72 mcg daily.  Given the renal cyst on your recent CT scan I would recommend you follow-up with your primary care regarding possible MRI that was recommended as follow-up.  Will plan to follow-up in 6-8 weeks.  It was a pleasure to see you today. I want to create trusting relationships with patients. If you receive a survey regarding your visit,  I greatly appreciate you taking time to fill this out on paper or through your MyChart. I value your feedback.  Venetia Night, MSN, FNP-BC, AGACNP-BC [facility]

## 2022-06-16 ENCOUNTER — Other Ambulatory Visit: Payer: Self-pay | Admitting: Gastroenterology

## 2022-06-16 DIAGNOSIS — R1084 Generalized abdominal pain: Secondary | ICD-10-CM

## 2022-06-16 DIAGNOSIS — G8929 Other chronic pain: Secondary | ICD-10-CM

## 2022-06-18 ENCOUNTER — Ambulatory Visit (HOSPITAL_COMMUNITY): Payer: Medicaid Other

## 2022-06-25 ENCOUNTER — Telehealth: Payer: Self-pay

## 2022-06-25 ENCOUNTER — Other Ambulatory Visit: Payer: Self-pay | Admitting: Gastroenterology

## 2022-06-25 DIAGNOSIS — R112 Nausea with vomiting, unspecified: Secondary | ICD-10-CM

## 2022-06-25 MED ORDER — ONDANSETRON 8 MG PO TBDP: 8.0000 mg | ORAL_TABLET | Freq: Three times a day (TID) | ORAL | 1 refills | Status: DC

## 2022-06-25 NOTE — Telephone Encounter (Signed)
Pharmacy sent over a refill request for ONDANSETRON ODT 8 MG TABLET Qty: 90. Pt was last seen on 06/12/22

## 2022-06-28 DIAGNOSIS — G8929 Other chronic pain: Secondary | ICD-10-CM | POA: Diagnosis not present

## 2022-06-28 DIAGNOSIS — K219 Gastro-esophageal reflux disease without esophagitis: Secondary | ICD-10-CM | POA: Diagnosis not present

## 2022-06-28 DIAGNOSIS — N281 Cyst of kidney, acquired: Secondary | ICD-10-CM | POA: Diagnosis not present

## 2022-06-28 DIAGNOSIS — E876 Hypokalemia: Secondary | ICD-10-CM | POA: Diagnosis not present

## 2022-06-28 DIAGNOSIS — I10 Essential (primary) hypertension: Secondary | ICD-10-CM | POA: Diagnosis not present

## 2022-06-28 DIAGNOSIS — Z720 Tobacco use: Secondary | ICD-10-CM | POA: Diagnosis not present

## 2022-07-04 DIAGNOSIS — N281 Cyst of kidney, acquired: Secondary | ICD-10-CM | POA: Diagnosis not present

## 2022-07-04 DIAGNOSIS — E876 Hypokalemia: Secondary | ICD-10-CM | POA: Diagnosis not present

## 2022-07-04 DIAGNOSIS — Z716 Tobacco abuse counseling: Secondary | ICD-10-CM | POA: Diagnosis not present

## 2022-07-04 DIAGNOSIS — N181 Chronic kidney disease, stage 1: Secondary | ICD-10-CM | POA: Diagnosis not present

## 2022-07-04 DIAGNOSIS — I129 Hypertensive chronic kidney disease with stage 1 through stage 4 chronic kidney disease, or unspecified chronic kidney disease: Secondary | ICD-10-CM | POA: Diagnosis not present

## 2022-07-04 HISTORY — DX: Chronic kidney disease, stage 1: N18.1

## 2022-07-10 ENCOUNTER — Ambulatory Visit (HOSPITAL_COMMUNITY): Payer: Medicaid Other

## 2022-07-10 DIAGNOSIS — F329 Major depressive disorder, single episode, unspecified: Secondary | ICD-10-CM | POA: Diagnosis not present

## 2022-07-10 DIAGNOSIS — F411 Generalized anxiety disorder: Secondary | ICD-10-CM | POA: Diagnosis not present

## 2022-07-13 ENCOUNTER — Telehealth: Payer: Medicaid Other | Admitting: Gastroenterology

## 2022-07-13 ENCOUNTER — Ambulatory Visit (HOSPITAL_COMMUNITY): Payer: Medicaid Other

## 2022-07-16 NOTE — Progress Notes (Deleted)
Primary Care Physician:  Vidal Schwalbe, MD Primary GI:  ***   Patient Location: Home  Provider Location: [place] office  Reason for Visit: ***  Persons present on the virtual encounter, with roles: Patient, myself (provider),***(updated meds and allergies)  Total time (minutes) spent on medical discussion: ***  Due to COVID-19, visit was conducted using *** method.  Visit was requested by patient.  Virtual Visit via ***  I connected with [name] on 07/16/22 at 11:00 AM EST by *** and verified that I am speaking with the correct person using two identifiers.   I discussed the limitations, risks, security and privacy concerns of performing an evaluation and management service by telephone/video and the availability of in person appointments. I also discussed with the patient that there may be a patient responsible charge related to this service. The patient expressed understanding and agreed to proceed.   HPI:   [name] is a 52 y.o. female who presents for virtual visit regarding ***   Last seen in office 05/2022. H/o GERD, candida esophagitis (06/2021), chronic nausea, chronic constipation, colitis on CT 08/2020 treated empirically for IBS***/suspected Crohn's with prednisone and Entocort with follow-up colonoscopy in February 2023 normal other than nonbleeding internal hemorrhoids and normal colon biopsies, chronic abdominal pain presenting today for follow-up post ED visit.   ***reglan did not help, takes zofran '8mg'$  ODT TID/phenergan breakthough GES negative. Bentyl '20mg'$  TID for abdominal pain. Linzess 11mg daily. Pantoprazole '40mg'$  BID.    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  Currently overdue   Procedures completed 07/07/21: Colonoscopy: Non-bleeding internal hemorrhoids, normal TI  biopsied, normal colon biopsied. Pathology with colon and TI biopsies normal.   EGD: Moderately severe candidiasis esophagitis with no bleeding. Cells for cytology obtained. Gastritis biopsied, normal examined duodenum.  Recommended PPI BID.  Stomach biopsy with moderate reactive gastropathy, negative for H pylori. KOH prep was positive.  She was prescribed Diflucan x14 days.       Current Outpatient Medications  Medication Sig Dispense Refill   busPIRone (BUSPAR) 5 MG tablet 1 tablet Orally Once a day     dicyclomine (BENTYL) 20 MG tablet TAKE ONE TABLET BY MOUTH THREE TIMES DAILY WITH FOOD. Hold for constipation 90 tablet 3   FLUoxetine (PROZAC) 10 MG capsule 1 capsule Orally Once a day for 30 days     hydrOXYzine (ATARAX/VISTARIL) 50 MG tablet Take 50 mg by mouth every 6 (six) hours as needed for anxiety.     linaclotide (LINZESS) 72 MCG capsule Take 1 capsule (72 mcg total) by mouth daily before breakfast. 30 capsule 5   metoprolol succinate (TOPROL-XL) 100 MG 24 hr tablet Take 100 mg by mouth daily. Take with or immediately following a meal.     ondansetron (ZOFRAN-ODT) 8 MG disintegrating tablet Take 1 tablet (8 mg total) by mouth every 8 (eight) hours.  90 tablet 1   pantoprazole (PROTONIX) 40 MG tablet TAKE (1) TABLET BY MOUTH TWICE A DAY BEFORE MEALS. (BREAKFAST AND SUPPER) 60 tablet 5   promethazine (PHENERGAN) 25  MG tablet Take 0.5 tablets (12.5 mg total) by mouth every 6 (six) hours as needed for nausea or vomiting. 90 tablet 0   temazepam (RESTORIL) 30 MG capsule Take 30 mg by mouth at bedtime as needed for sleep.     venlafaxine (EFFEXOR) 75 MG tablet Take 75 mg by mouth daily. (Patient not taking: Reported on 02/16/2022)     VRAYLAR 1.5 MG capsule Take 1.5 mg by mouth daily.     No current facility-administered medications for this visit.    Past Medical History:  Diagnosis Date   Chronic abdominal pain    Chronic back pain    Colitis    Constipation    GERD (gastroesophageal reflux disease)    Hypertension    Migraine    Plumbism    blood clot    PONV (postoperative nausea and vomiting)    Renal mass    Uterine cancer [facility])    age 62    Past Surgical History:  Procedure Laterality Date   ABDOMINAL HYSTERECTOMY     BACK SURGERY     BIOPSY  07/07/2021   Procedure: BIOPSY;  Surgeon: Eloise Harman, DO;  [facility];  Service: Endoscopy;;   CHOLECYSTECTOMY     Complicated by bile leak requiring ERCP with temporary stenting   COLONOSCOPY WITH ESOPHAGOGASTRODUODENOSCOPY (EGD)  07/2009   [facility]: Op notes cannot be seen through care everywhere however pathology showed terminal ileum and random colon biopsies normal.  Stomach biopsy with gastric antral and fundic mucosa with reactive foveolar hyperplasia.  No active gastritis.  Stains negative for H. pylori.   COLONOSCOPY WITH PROPOFOL N/A 07/07/2021   Surgeon: Hurshel Keys K, DO;  Non-bleeding internal hemorrhoids, normal TI  biopsied, normal colon biopsied. Pathology with colon and TI biopsies normal.   ESOPHAGEAL BRUSHING  07/07/2021   Procedure: ESOPHAGEAL BRUSHING;  Surgeon: Eloise Harman, DO;  [facility];  Service: Endoscopy;;   ESOPHAGOGASTRODUODENOSCOPY  06/2007   [facility]: Normal esophagus, gastric mucosal abnormality with erythema, biopsy showed gastric antral and fundic mucosa with reactive  foveolar hyperplasia, no gastritis, no H. pylori.   ESOPHAGOGASTRODUODENOSCOPY (EGD) WITH PROPOFOL N/A 07/07/2021   Surgeon: Eloise Harman, DO; moderately severe Candida esophagitis, gastritis, biopsies negative for H. pylori, normal examined duodenum.  Prescribed Diflucan and recommended PPI twice daily.   HAND SURGERY      Family History  Problem Relation Age of Onset   Cancer Other    Seizures Other    Stroke Other    Diabetes Other     Social History   Socioeconomic History   Marital status: Married    Spouse name: benito   Number of children: 2   Years of education: Not on file   Highest education level: Associate degree: occupational, Hotel manager, or vocational program  Occupational History    Comment: not employed  Tobacco Use   Smoking status: Every Day    Packs/day: 1.00    Years: 20.00    Total pack years: 20.00    Types: Cigarettes, E-cigarettes   Smokeless tobacco: Never  Vaping Use   Vaping Use: Never used  Substance and Sexual Activity   Alcohol use: No   Drug use: No   Sexual activity: Yes    Birth control/protection: Surgical  Other Topics Concern   Not on file  Social History Narrative   Not on file   Social Determinants of Health   Financial Resource Strain: Low Risk  (03/19/2017)   Overall Financial Resource Strain (CARDIA)    Difficulty of Paying Living Expenses: Not very hard  Food Insecurity: No Food Insecurity (03/19/2017)   Hunger Vital Sign    Worried About Running Out of Food in the Last Year: Never true    Ran Out of Food in the Last Year: Never true  Transportation [name]: No Transportation [name] (03/19/2017)   PRAPARE - Hydrologist (Medical): No    Lack of Transportation (Non-Medical): No  Physical Activity: Inactive (03/19/2017)   Exercise Vital Sign    Days of Exercise per Week: 0 days    Minutes of Exercise per Session: 0 min  Stress: Stress Concern Present (03/19/2017)   [place]    Feeling of Stress : To some extent  Social Connections: Somewhat Isolated (03/19/2017)   Social Connection and Isolation Panel [NHANES]    Frequency of Communication with Friends and Family: More than three times a week    Frequency of Social Gatherings with Friends and Family: Twice a week    Attends Religious Services: Never    Marine scientist or Organizations: No    Attends Archivist Meetings: Never    Marital Status: Married  Human resources officer Violence: Not At Risk (03/19/2017)   Humiliation, Afraid, Rape, and Kick questionnaire    Fear of Current or Ex-Partner: No    Emotionally Abused: No    Physically Abused: No    Sexually Abused: No      ROS:  General: Negative for anorexia, weight loss, fever, chills, fatigue, weakness. Eyes: Negative for vision changes.  ENT: Negative for hoarseness, difficulty swallowing , nasal congestion. CV: Negative for chest pain, angina, palpitations, dyspnea on exertion, peripheral edema.  Respiratory: Negative for dyspnea at rest, dyspnea on exertion, cough, sputum, wheezing.  GI: See history of present illness. GU:  Negative for dysuria, hematuria, urinary incontinence, urinary frequency, nocturnal urination.  MS: Negative for joint pain, low back pain.  Derm: Negative for rash or itching.  Neuro: Negative for weakness, abnormal sensation, seizure, frequent headaches, memory loss, confusion.  Psych: Negative for anxiety, depression, suicidal ideation, hallucinations.  Endo: Negative for unusual weight change.  Heme: Negative for bruising or bleeding. Allergy: Negative for rash or hives.   Observations/Objective:  Lab Results  Component Value Date   CREATININE 0.75 05/27/2022   BUN 9 05/27/2022   NA 136 05/27/2022   K 3.4 (L) 05/27/2022   CL 99 05/27/2022   CO2 22 05/27/2022   Lab Results  Component Value Date   ALT 13 05/27/2022   AST 25 05/27/2022    ALKPHOS 170 (H) 05/27/2022   BILITOT 0.5 05/27/2022   Lab Results  Component Value Date   LIPASE 31 05/27/2022   Lab Results  Component Value Date   WBC 10.9 (H) 05/27/2022   HGB 13.2 05/27/2022   HCT 42.9 05/27/2022   MCV 88.1 05/27/2022   PLT 507 (H) 05/27/2022    Assessment and Plan:   Follow Up Instructions:    I discussed the assessment and treatment plan with the patient. The patient was provided an opportunity to ask questions and all were answered. The patient agreed with the plan and demonstrated an understanding of the instructions. AVS mailed  to patient's home address.   The patient was advised to call back or seek an in-person evaluation if the symptoms worsen or if the condition fails to improve as anticipated.  I provided *** minutes of virtual face-to-face time during this encounter.   Neil Crouch, PA-C

## 2022-07-17 ENCOUNTER — Telehealth: Payer: Medicaid Other | Admitting: Gastroenterology

## 2022-07-19 ENCOUNTER — Other Ambulatory Visit: Payer: Self-pay | Admitting: Gastroenterology

## 2022-07-19 DIAGNOSIS — R112 Nausea with vomiting, unspecified: Secondary | ICD-10-CM

## 2022-08-02 DIAGNOSIS — G8929 Other chronic pain: Secondary | ICD-10-CM | POA: Diagnosis not present

## 2022-08-02 DIAGNOSIS — N179 Acute kidney failure, unspecified: Secondary | ICD-10-CM | POA: Diagnosis not present

## 2022-08-02 DIAGNOSIS — I10 Essential (primary) hypertension: Secondary | ICD-10-CM | POA: Diagnosis not present

## 2022-08-02 DIAGNOSIS — E785 Hyperlipidemia, unspecified: Secondary | ICD-10-CM | POA: Diagnosis not present

## 2022-08-02 DIAGNOSIS — D509 Iron deficiency anemia, unspecified: Secondary | ICD-10-CM | POA: Diagnosis not present

## 2022-08-02 DIAGNOSIS — N281 Cyst of kidney, acquired: Secondary | ICD-10-CM | POA: Diagnosis not present

## 2022-08-02 DIAGNOSIS — F329 Major depressive disorder, single episode, unspecified: Secondary | ICD-10-CM | POA: Diagnosis not present

## 2022-08-02 DIAGNOSIS — E039 Hypothyroidism, unspecified: Secondary | ICD-10-CM | POA: Diagnosis not present

## 2022-08-02 DIAGNOSIS — R0602 Shortness of breath: Secondary | ICD-10-CM | POA: Diagnosis not present

## 2022-08-02 DIAGNOSIS — K219 Gastro-esophageal reflux disease without esophagitis: Secondary | ICD-10-CM | POA: Diagnosis not present

## 2022-08-02 DIAGNOSIS — E559 Vitamin D deficiency, unspecified: Secondary | ICD-10-CM | POA: Diagnosis not present

## 2022-08-02 DIAGNOSIS — Z720 Tobacco use: Secondary | ICD-10-CM | POA: Diagnosis not present

## 2022-08-08 ENCOUNTER — Other Ambulatory Visit (HOSPITAL_COMMUNITY): Payer: Self-pay | Admitting: Nephrology

## 2022-08-08 DIAGNOSIS — N281 Cyst of kidney, acquired: Secondary | ICD-10-CM

## 2022-08-08 DIAGNOSIS — I129 Hypertensive chronic kidney disease with stage 1 through stage 4 chronic kidney disease, or unspecified chronic kidney disease: Secondary | ICD-10-CM

## 2022-08-08 DIAGNOSIS — N181 Chronic kidney disease, stage 1: Secondary | ICD-10-CM

## 2022-08-09 ENCOUNTER — Other Ambulatory Visit (HOSPITAL_COMMUNITY): Payer: Self-pay | Admitting: Emergency Medicine

## 2022-08-09 DIAGNOSIS — Z1231 Encounter for screening mammogram for malignant neoplasm of breast: Secondary | ICD-10-CM

## 2022-08-13 DIAGNOSIS — Z9289 Personal history of other medical treatment: Secondary | ICD-10-CM

## 2022-08-13 DIAGNOSIS — D649 Anemia, unspecified: Secondary | ICD-10-CM

## 2022-08-13 HISTORY — DX: Anemia, unspecified: D64.9

## 2022-08-13 HISTORY — DX: Personal history of other medical treatment: Z92.89

## 2022-08-13 NOTE — Progress Notes (Deleted)
Primary Care Physician:  Vidal Schwalbe, MD  Primary GI: Dr. Abbey Chatters  Patient Location: Home   Provider Location: [facility] office   Reason for Visit: ***   Persons present on the virtual encounter, with roles: Aliene Altes, PA-C (Provider), [name] (patient)   Total time (minutes) spent on medical discussion: ### minutes  Virtual Visit via *** Note Due to COVID-19, visit is conducted virtually and was requested by patient.   I connected withNAME@ on 08/13/22 at 11:30 AM EDT by *** and verified that I am speaking with the correct person using two identifiers.   I discussed the limitations, risks, security and privacy concerns of performing an evaluation and management service by *** and the availability of in person appointments. I also discussed with the patient that there may be a patient responsible charge related to this service. The patient expressed understanding and agreed to proceed.  No chief complaint on file.    History of Present Illness:  52 y.o. female with a history of GERD, Candida esophagitis in February 2023 treated with Diflucan, chronic nausea, chronic constipation, colitis on CT in April 2022 treated empirically for IBS/suspected Crohn's with prednisone and Entocort with follow-up colonoscopy in February 2023 (off all steroids for several months)   normal other than nonbleeding internal hemorrhoids and normal colon biopsies, chronic abdominal pain, presenting today for follow-up.   Last seen in our office 06/12/2022.  She had previously been seen in the emergency room 05/27/2022 with worsening abdominal pain, persistent vomiting throughout the day.  CT A/P showed diffuse colonic wall thickening extending from distal transverse colon throughout the rectosigmoid junction consistent with inflammatory or infectious colitis.  She was given a 5-day course of prednisone.  She also had CT angiography 06/05/2022 that showed patent mesenteric vasculature.  By the time of her  office visit, she reported completing prednisone course, but still having abdominal pain, but no vomiting.  Stated loss of appetite, nausea, vomiting were her most severe symptoms.  She was using Zofran and Phenergan for nausea.  Did not feel Reglan worked well for her in the hospital.  She was prescribed a 5-day course of Cipro.  Advised to continue PPI twice daily, antiemetics, and Linzess 72 mcg daily.  Patient asking about Librax and possible appetite stimulant.  Plan to discuss with Dr. Abbey Chatters.  Dr. Abbey Chatters recommended avoiding Librax if possible.  Stated may need to consider referral to [facility] for second opinion.  Today:       ?previously on pain medications for abdominal pain?  Past Medical History:  Diagnosis Date   Chronic abdominal pain    Chronic back pain    Colitis    Constipation    GERD (gastroesophageal reflux disease)    Hypertension    Migraine    Plumbism    blood clot    PONV (postoperative nausea and vomiting)    Renal mass    Uterine cancer [facility])    age 52     Past Surgical History:  Procedure Laterality Date   ABDOMINAL HYSTERECTOMY     BACK SURGERY     BIOPSY  07/07/2021   Procedure: BIOPSY;  Surgeon: Eloise Harman, DO;  [facility];  Service: Endoscopy;;   CHOLECYSTECTOMY     Complicated by bile leak requiring ERCP with temporary stenting   COLONOSCOPY WITH ESOPHAGOGASTRODUODENOSCOPY (EGD)  07/2009   [facility]: Op notes cannot be seen through care everywhere however pathology showed terminal ileum and random colon  biopsies normal.  Stomach biopsy with gastric antral and fundic mucosa with reactive foveolar hyperplasia.  No active gastritis.  Stains negative for H. pylori.   COLONOSCOPY WITH PROPOFOL N/A 07/07/2021   Surgeon: Hurshel Keys K, DO;  Non-bleeding internal hemorrhoids, normal TI  biopsied, normal colon biopsied. Pathology with colon and TI biopsies normal.   ESOPHAGEAL BRUSHING  07/07/2021   Procedure: ESOPHAGEAL  BRUSHING;  Surgeon: Eloise Harman, DO;  [facility];  Service: Endoscopy;;   ESOPHAGOGASTRODUODENOSCOPY  06/2007   [facility]: Normal esophagus, gastric mucosal abnormality with erythema, biopsy showed gastric antral and fundic mucosa with reactive foveolar hyperplasia, no gastritis, no H. pylori.   ESOPHAGOGASTRODUODENOSCOPY (EGD) WITH PROPOFOL N/A 07/07/2021   Surgeon: Eloise Harman, DO; moderately severe Candida esophagitis, gastritis, biopsies negative for H. pylori, normal examined duodenum.  Prescribed Diflucan and recommended PPI twice daily.   HAND SURGERY       No outpatient medications have been marked as taking for the 08/15/22 encounter (Appointment) with Erenest Rasher, PA-C.     Family History  Problem Relation Age of Onset   Cancer Other    Seizures Other    Stroke Other    Diabetes Other     Social History   Socioeconomic History   Marital status: Married    Spouse name: benito   Number of children: 2   Years of education: Not on file   Highest education level: Associate degree: occupational, Hotel manager, or vocational program  Occupational History    Comment: not employed  Tobacco Use   Smoking status: Every Day    Packs/day: 1.00    Years: 20.00    Additional pack years: 0.00    Total pack years: 20.00    Types: Cigarettes, E-cigarettes   Smokeless tobacco: Never  Vaping Use   Vaping Use: Never used  Substance and Sexual Activity   Alcohol use: No   Drug use: No   Sexual activity: Yes    Birth control/protection: Surgical  Other Topics Concern   Not on file  Social History Narrative   Not on file   Social Determinants of Health   Financial Resource Strain: Low Risk  (03/19/2017)   Overall Financial Resource Strain (CARDIA)    Difficulty of Paying Living Expenses: Not very hard  Food Insecurity: No Food Insecurity (03/19/2017)   Hunger Vital Sign    Worried About Running Out of Food in the Last Year: Never true    Ran Out of  Food in the Last Year: Never true  Transportation [name]: No Transportation [name] (03/19/2017)   PRAPARE - Hydrologist (Medical): No    Lack of Transportation (Non-Medical): No  Physical Activity: Inactive (03/19/2017)   Exercise Vital Sign    Days of Exercise per Week: 0 days    Minutes of Exercise per Session: 0 min  Stress: Stress Concern Present (03/19/2017)   [place]    Feeling of Stress : To some extent  Social Connections: Somewhat Isolated (03/19/2017)   Social Connection and Isolation Panel [NHANES]    Frequency of Communication with Friends and Family: More than three times a week    Frequency of Social Gatherings with Friends and Family: Twice a week    Attends Religious Services: Never    Marine scientist or Organizations: No    Attends Archivist Meetings: Never    Marital Status: Married  Review of Systems: Gen: Denies fever, chills, anorexia. Denies fatigue, weakness, weight loss.  CV: Denies chest pain, palpitations, syncope, peripheral edema, and claudication. Resp: Denies dyspnea at rest, cough, wheezing, coughing up blood, and pleurisy. GI: see HPI Derm: Denies rash, itching, dry skin Psych: Denies depression, anxiety, memory loss, confusion. No homicidal or suicidal ideation.  Heme: Denies bruising, bleeding, and enlarged lymph nodes.  Observations/Objective: No distress. Alert and oriented. Pleasant. Well nourished. Normal mood and affect. Unable to perform complete physical exam due to *** encounter. No video available. ***   Assessment:     Plan: ***     I discussed the assessment and treatment plan with the patient. The patient was provided an opportunity to ask questions and all were answered. The patient agreed with the plan and demonstrated an understanding of the instructions.   The patient was advised to call back or seek an  in-person evaluation if the symptoms worsen or if the condition fails to improve as anticipated.  I provided *** minutes of non***-face-to-face time during this encounter.  Aliene Altes, PA-C [facility]  08/15/2022

## 2022-08-14 ENCOUNTER — Other Ambulatory Visit: Payer: Self-pay | Admitting: Gastroenterology

## 2022-08-14 DIAGNOSIS — R112 Nausea with vomiting, unspecified: Secondary | ICD-10-CM

## 2022-08-15 ENCOUNTER — Telehealth: Payer: Medicaid Other | Admitting: Gastroenterology

## 2022-08-20 NOTE — Progress Notes (Unsigned)
Referring Provider: *** Primary Care Physician:  Smith Robert, MD (Inactive)  Primary GI: ***  Patient Location: Home   Provider Location: [facility] office   Reason for Visit: ***   Persons present on the virtual encounter, with roles: Ermalinda Memos, PA-C (Provider), [name] (patient)   Total time (minutes) spent on medical discussion: ### minutes  Virtual Visit via *** Note Due to COVID-19, visit is conducted virtually and was requested by patient.   I connected withNAME@ on 08/20/22 at 11:30 AM EDT by *** and verified that I am speaking with the correct person using two identifiers.   I discussed the limitations, risks, security and privacy concerns of performing an evaluation and management service by *** and the availability of in person appointments. I also discussed with the patient that there may be a patient responsible charge related to this service. The patient expressed understanding and agreed to proceed.  No chief complaint on file.    History of Present Illness: 52 y.o. female with a history of GERD, Candida esophagitis in February 2023 treated with Diflucan, chronic nausea, chronic constipation, colitis on CT in April 2022 treated empirically for IBS/suspected Crohn's with prednisone and Entocort with follow-up colonoscopy in February 2023 (off all steroids for several months)   normal other than nonbleeding internal hemorrhoids and normal colon biopsies, chronic abdominal pain, presenting today for follow-up.    Last seen in our office 06/12/2022.  She had previously been seen in the emergency room 05/27/2022 with worsening abdominal pain, persistent vomiting throughout the day.  CT A/P showed diffuse colonic wall thickening extending from distal transverse colon throughout the rectosigmoid junction consistent with inflammatory or infectious colitis.  She was given a 5-day course of prednisone.  She also had CT angiography 06/05/2022 that showed patent mesenteric  vasculature.  By the time of her office visit, she reported completing prednisone course, but still having abdominal pain, but no vomiting.  Stated loss of appetite, nausea, vomiting were her most severe symptoms.  She was using Zofran and Phenergan for nausea.  Did not feel Reglan worked well for her in the hospital.  She was prescribed a 5-day course of Cipro.  Advised to continue PPI twice daily, antiemetics, and Linzess 72 mcg daily.  Patient asking about Librax and possible appetite stimulant.  Plan to discuss with Dr. Marletta Lor.   Dr. Marletta Lor recommended avoiding Librax if possible.  Stated may need to consider referral to [facility] for second opinion.   Today:          ?previously on pain medications for abdominal pain?    Past Medical History:  Diagnosis Date   Chronic abdominal pain    Chronic back pain    Colitis    Constipation    GERD (gastroesophageal reflux disease)    Hypertension    Migraine    Plumbism    blood clot    PONV (postoperative nausea and vomiting)    Renal mass    Uterine cancer [facility])    age 84     Past Surgical History:  Procedure Laterality Date   ABDOMINAL HYSTERECTOMY     BACK SURGERY     BIOPSY  07/07/2021   Procedure: BIOPSY;  Surgeon: Lanelle Bal, DO;  [facility];  Service: Endoscopy;;   CHOLECYSTECTOMY     Complicated by bile leak requiring ERCP with temporary stenting   COLONOSCOPY WITH ESOPHAGOGASTRODUODENOSCOPY (EGD)  07/2009   [facility]: Op notes cannot be seen through  care everywhere however pathology showed terminal ileum and random colon biopsies normal.  Stomach biopsy with gastric antral and fundic mucosa with reactive foveolar hyperplasia.  No active gastritis.  Stains negative for H. pylori.   COLONOSCOPY WITH PROPOFOL N/A 07/07/2021   Surgeon: Earnest Bailey K, DO;  Non-bleeding internal hemorrhoids, normal TI  biopsied, normal colon biopsied. Pathology with colon and TI biopsies normal.   ESOPHAGEAL BRUSHING   07/07/2021   Procedure: ESOPHAGEAL BRUSHING;  Surgeon: Lanelle Bal, DO;  [facility];  Service: Endoscopy;;   ESOPHAGOGASTRODUODENOSCOPY  06/2007   [facility]: Normal esophagus, gastric mucosal abnormality with erythema, biopsy showed gastric antral and fundic mucosa with reactive foveolar hyperplasia, no gastritis, no H. pylori.   ESOPHAGOGASTRODUODENOSCOPY (EGD) WITH PROPOFOL N/A 07/07/2021   Surgeon: Lanelle Bal, DO; moderately severe Candida esophagitis, gastritis, biopsies negative for H. pylori, normal examined duodenum.  Prescribed Diflucan and recommended PPI twice daily.   HAND SURGERY       No outpatient medications have been marked as taking for the 08/23/22 encounter (Appointment) with Letta Median, PA-C.     Family History  Problem Relation Age of Onset   Cancer Other    Seizures Other    Stroke Other    Diabetes Other     Social History   Socioeconomic History   Marital status: Married    Spouse name: benito   Number of children: 2   Years of education: Not on file   Highest education level: Associate degree: occupational, Scientist, product/process development, or vocational program  Occupational History    Comment: not employed  Tobacco Use   Smoking status: Every Day    Packs/day: 1.00    Years: 20.00    Additional pack years: 0.00    Total pack years: 20.00    Types: Cigarettes, E-cigarettes   Smokeless tobacco: Never  Vaping Use   Vaping Use: Never used  Substance and Sexual Activity   Alcohol use: No   Drug use: No   Sexual activity: Yes    Birth control/protection: Surgical  Other Topics Concern   Not on file  Social History Narrative   Not on file   Social Determinants of Health   Financial Resource Strain: Low Risk  (03/19/2017)   Overall Financial Resource Strain (CARDIA)    Difficulty of Paying Living Expenses: Not very hard  Food Insecurity: No Food Insecurity (03/19/2017)   Hunger Vital Sign    Worried About Running Out of Food in the  Last Year: Never true    Ran Out of Food in the Last Year: Never true  Transportation Needs: No Transportation Needs (03/19/2017)   PRAPARE - Administrator, Civil Service (Medical): No    Lack of Transportation (Non-Medical): No  Physical Activity: Inactive (03/19/2017)   Exercise Vital Sign    Days of Exercise per Week: 0 days    Minutes of Exercise per Session: 0 min  Stress: Stress Concern Present (03/19/2017)   [employer] of Occupational Health - Occupational Stress Questionnaire    Feeling of Stress : To some extent  Social Connections: Somewhat Isolated (03/19/2017)   Social Connection and Isolation Panel [NHANES]    Frequency of Communication with Friends and Family: More than three times a week    Frequency of Social Gatherings with Friends and Family: Twice a week    Attends Religious Services: Never    Database administrator or Organizations: No    Attends Banker  Meetings: Never    Marital Status: Married       Review of Systems: Gen: Denies fever, chills, anorexia. Denies fatigue, weakness, weight loss.  CV: Denies chest pain, palpitations, syncope, peripheral edema, and claudication. Resp: Denies dyspnea at rest, cough, wheezing, coughing up blood, and pleurisy. GI: see HPI Derm: Denies rash, itching, dry skin Psych: Denies depression, anxiety, memory loss, confusion. No homicidal or suicidal ideation.  Heme: Denies bruising, bleeding, and enlarged lymph nodes.  Observations/Objective: No distress. Alert and oriented. Pleasant. Well nourished. Normal mood and affect. Unable to perform complete physical exam due to *** encounter. No video available. ***   Assessment:     Plan: ***     I discussed the assessment and treatment plan with the patient. The patient was provided an opportunity to ask questions and all were answered. The patient agreed with the plan and demonstrated an understanding of the instructions.   The patient  was advised to call back or seek an in-person evaluation if the symptoms worsen or if the condition fails to improve as anticipated.  I provided *** minutes of non***-face-to-face time during this encounter.  Ermalinda MemosKristen Kiel Cockerell, PA-C [facility]  08/23/2022

## 2022-08-23 ENCOUNTER — Telehealth (INDEPENDENT_AMBULATORY_CARE_PROVIDER_SITE_OTHER): Payer: Medicaid Other | Admitting: Gastroenterology

## 2022-08-23 ENCOUNTER — Encounter: Payer: Self-pay | Admitting: Gastroenterology

## 2022-08-23 VITALS — Ht 61.0 in | Wt 120.0 lb

## 2022-08-23 DIAGNOSIS — R1084 Generalized abdominal pain: Secondary | ICD-10-CM | POA: Insufficient documentation

## 2022-08-23 DIAGNOSIS — R112 Nausea with vomiting, unspecified: Secondary | ICD-10-CM

## 2022-08-23 DIAGNOSIS — K5900 Constipation, unspecified: Secondary | ICD-10-CM

## 2022-08-23 DIAGNOSIS — K219 Gastro-esophageal reflux disease without esophagitis: Secondary | ICD-10-CM

## 2022-08-23 DIAGNOSIS — G8929 Other chronic pain: Secondary | ICD-10-CM | POA: Insufficient documentation

## 2022-08-23 NOTE — Patient Instructions (Addendum)
For your acid reflux: Continue taking pantoprazole 40 mg twice daily 30 minutes before breakfast and dinner. You can start taking famotidine 20 mg daily at lunchtime to help prevent breakthrough.  Otherwise, he can use famotidine as needed up to twice a day. Follow a GERD diet:  Avoid fried, fatty, greasy, spicy, citrus foods. Avoid caffeine and carbonated beverages. Avoid chocolate. Try eating 4-6 small meals a day rather than 3 large meals. Do not eat within 3 hours of laying down. Prop head of bed up on wood or bricks to create a 6 inch incline.   For your bowels: Continue taking Linzess 72 mcg daily 30 minutes before breakfast. Continue dicyclomine 3 times daily before meals.  For your chronic nausea: Continue using Zofran scheduled every 8 hours and Phenergan as needed.  Will follow-up with you in 6 months or sooner if needed.  Ermalinda Memos, PA-C [facility]

## 2022-08-26 ENCOUNTER — Other Ambulatory Visit: Payer: Self-pay | Admitting: Gastroenterology

## 2022-08-26 DIAGNOSIS — R1084 Generalized abdominal pain: Secondary | ICD-10-CM

## 2022-08-26 DIAGNOSIS — G8929 Other chronic pain: Secondary | ICD-10-CM

## 2022-08-27 ENCOUNTER — Ambulatory Visit (HOSPITAL_COMMUNITY): Admission: RE | Admit: 2022-08-27 | Payer: Medicaid Other | Source: Ambulatory Visit

## 2022-09-02 ENCOUNTER — Emergency Department (HOSPITAL_COMMUNITY): Payer: Medicaid Other

## 2022-09-02 ENCOUNTER — Encounter (HOSPITAL_COMMUNITY): Payer: Self-pay

## 2022-09-02 ENCOUNTER — Other Ambulatory Visit: Payer: Self-pay

## 2022-09-02 ENCOUNTER — Inpatient Hospital Stay (HOSPITAL_COMMUNITY)
Admission: EM | Admit: 2022-09-02 | Discharge: 2022-09-09 | DRG: 871 | Disposition: A | Discharge status: Home Health | Payer: Medicaid Other | Attending: Family Medicine | Admitting: Family Medicine

## 2022-09-02 DIAGNOSIS — Z823 Family history of stroke: Secondary | ICD-10-CM

## 2022-09-02 DIAGNOSIS — R1033 Periumbilical pain: Secondary | ICD-10-CM

## 2022-09-02 DIAGNOSIS — I5041 Acute combined systolic (congestive) and diastolic (congestive) heart failure: Secondary | ICD-10-CM | POA: Diagnosis not present

## 2022-09-02 DIAGNOSIS — Z1152 Encounter for screening for COVID-19: Secondary | ICD-10-CM

## 2022-09-02 DIAGNOSIS — N12 Tubulo-interstitial nephritis, not specified as acute or chronic: Secondary | ICD-10-CM

## 2022-09-02 DIAGNOSIS — D649 Anemia, unspecified: Secondary | ICD-10-CM

## 2022-09-02 DIAGNOSIS — A419 Sepsis, unspecified organism: Secondary | ICD-10-CM

## 2022-09-02 DIAGNOSIS — I110 Hypertensive heart disease with heart failure: Secondary | ICD-10-CM | POA: Diagnosis present

## 2022-09-02 DIAGNOSIS — Z881 Allergy status to other antibiotic agents status: Secondary | ICD-10-CM

## 2022-09-02 DIAGNOSIS — G894 Chronic pain syndrome: Secondary | ICD-10-CM | POA: Diagnosis present

## 2022-09-02 DIAGNOSIS — G4700 Insomnia, unspecified: Secondary | ICD-10-CM | POA: Diagnosis present

## 2022-09-02 DIAGNOSIS — E876 Hypokalemia: Secondary | ICD-10-CM | POA: Diagnosis present

## 2022-09-02 DIAGNOSIS — A403 Sepsis due to Streptococcus pneumoniae: Secondary | ICD-10-CM | POA: Diagnosis not present

## 2022-09-02 DIAGNOSIS — Z1619 Resistance to other specified beta lactam antibiotics: Secondary | ICD-10-CM | POA: Diagnosis present

## 2022-09-02 DIAGNOSIS — I10 Essential (primary) hypertension: Secondary | ICD-10-CM | POA: Diagnosis not present

## 2022-09-02 DIAGNOSIS — I509 Heart failure, unspecified: Secondary | ICD-10-CM | POA: Diagnosis not present

## 2022-09-02 DIAGNOSIS — J811 Chronic pulmonary edema: Secondary | ICD-10-CM | POA: Diagnosis not present

## 2022-09-02 DIAGNOSIS — J189 Pneumonia, unspecified organism: Secondary | ICD-10-CM | POA: Diagnosis not present

## 2022-09-02 DIAGNOSIS — K581 Irritable bowel syndrome with constipation: Secondary | ICD-10-CM | POA: Diagnosis present

## 2022-09-02 DIAGNOSIS — A4151 Sepsis due to Escherichia coli [E. coli]: Principal | ICD-10-CM | POA: Diagnosis present

## 2022-09-02 DIAGNOSIS — J440 Chronic obstructive pulmonary disease with acute lower respiratory infection: Secondary | ICD-10-CM | POA: Diagnosis present

## 2022-09-02 DIAGNOSIS — I700 Atherosclerosis of aorta: Secondary | ICD-10-CM | POA: Diagnosis not present

## 2022-09-02 DIAGNOSIS — Z888 Allergy status to other drugs, medicaments and biological substances status: Secondary | ICD-10-CM

## 2022-09-02 DIAGNOSIS — I5031 Acute diastolic (congestive) heart failure: Secondary | ICD-10-CM | POA: Diagnosis not present

## 2022-09-02 DIAGNOSIS — F17210 Nicotine dependence, cigarettes, uncomplicated: Secondary | ICD-10-CM | POA: Diagnosis present

## 2022-09-02 DIAGNOSIS — Z716 Tobacco abuse counseling: Secondary | ICD-10-CM

## 2022-09-02 DIAGNOSIS — J181 Lobar pneumonia, unspecified organism: Secondary | ICD-10-CM | POA: Insufficient documentation

## 2022-09-02 DIAGNOSIS — K529 Noninfective gastroenteritis and colitis, unspecified: Secondary | ICD-10-CM | POA: Diagnosis not present

## 2022-09-02 DIAGNOSIS — R7881 Bacteremia: Secondary | ICD-10-CM | POA: Insufficient documentation

## 2022-09-02 DIAGNOSIS — Z765 Malingerer [conscious simulation]: Secondary | ICD-10-CM

## 2022-09-02 DIAGNOSIS — Z90710 Acquired absence of both cervix and uterus: Secondary | ICD-10-CM

## 2022-09-02 DIAGNOSIS — G9341 Metabolic encephalopathy: Secondary | ICD-10-CM | POA: Diagnosis present

## 2022-09-02 DIAGNOSIS — Z79899 Other long term (current) drug therapy: Secondary | ICD-10-CM

## 2022-09-02 DIAGNOSIS — K599 Functional intestinal disorder, unspecified: Secondary | ICD-10-CM | POA: Diagnosis present

## 2022-09-02 DIAGNOSIS — F419 Anxiety disorder, unspecified: Secondary | ICD-10-CM | POA: Diagnosis present

## 2022-09-02 DIAGNOSIS — A414 Sepsis due to anaerobes: Secondary | ICD-10-CM | POA: Diagnosis not present

## 2022-09-02 DIAGNOSIS — R6520 Severe sepsis without septic shock: Secondary | ICD-10-CM | POA: Diagnosis not present

## 2022-09-02 DIAGNOSIS — A09 Infectious gastroenteritis and colitis, unspecified: Secondary | ICD-10-CM | POA: Diagnosis present

## 2022-09-02 DIAGNOSIS — Z886 Allergy status to analgesic agent status: Secondary | ICD-10-CM

## 2022-09-02 DIAGNOSIS — I517 Cardiomegaly: Secondary | ICD-10-CM

## 2022-09-02 DIAGNOSIS — R109 Unspecified abdominal pain: Secondary | ICD-10-CM | POA: Diagnosis not present

## 2022-09-02 DIAGNOSIS — E871 Hypo-osmolality and hyponatremia: Secondary | ICD-10-CM | POA: Diagnosis not present

## 2022-09-02 DIAGNOSIS — K922 Gastrointestinal hemorrhage, unspecified: Secondary | ICD-10-CM | POA: Diagnosis not present

## 2022-09-02 DIAGNOSIS — I3139 Other pericardial effusion (noninflammatory): Secondary | ICD-10-CM | POA: Diagnosis not present

## 2022-09-02 DIAGNOSIS — Z800 Family history of malignant neoplasm of digestive organs: Secondary | ICD-10-CM

## 2022-09-02 DIAGNOSIS — Z9049 Acquired absence of other specified parts of digestive tract: Secondary | ICD-10-CM

## 2022-09-02 DIAGNOSIS — Z833 Family history of diabetes mellitus: Secondary | ICD-10-CM

## 2022-09-02 DIAGNOSIS — J449 Chronic obstructive pulmonary disease, unspecified: Secondary | ICD-10-CM | POA: Diagnosis present

## 2022-09-02 DIAGNOSIS — Z880 Allergy status to penicillin: Secondary | ICD-10-CM

## 2022-09-02 DIAGNOSIS — Z8542 Personal history of malignant neoplasm of other parts of uterus: Secondary | ICD-10-CM

## 2022-09-02 DIAGNOSIS — J9601 Acute respiratory failure with hypoxia: Secondary | ICD-10-CM | POA: Diagnosis not present

## 2022-09-02 DIAGNOSIS — R6521 Severe sepsis with septic shock: Secondary | ICD-10-CM | POA: Diagnosis not present

## 2022-09-02 DIAGNOSIS — R0603 Acute respiratory distress: Secondary | ICD-10-CM | POA: Diagnosis not present

## 2022-09-02 DIAGNOSIS — I429 Cardiomyopathy, unspecified: Secondary | ICD-10-CM | POA: Diagnosis present

## 2022-09-02 DIAGNOSIS — R112 Nausea with vomiting, unspecified: Secondary | ICD-10-CM

## 2022-09-02 DIAGNOSIS — J90 Pleural effusion, not elsewhere classified: Secondary | ICD-10-CM | POA: Diagnosis not present

## 2022-09-02 DIAGNOSIS — J4489 Other specified chronic obstructive pulmonary disease: Secondary | ICD-10-CM | POA: Diagnosis present

## 2022-09-02 DIAGNOSIS — K2100 Gastro-esophageal reflux disease with esophagitis, without bleeding: Secondary | ICD-10-CM | POA: Diagnosis present

## 2022-09-02 HISTORY — DX: Tubulo-interstitial nephritis, not specified as acute or chronic: N12

## 2022-09-02 HISTORY — DX: Sepsis, unspecified organism: A41.9

## 2022-09-02 LAB — COMPREHENSIVE METABOLIC PANEL
ALT: 18 U/L (ref 0–44)
AST: 47 U/L — ABNORMAL HIGH (ref 15–41)
Albumin: 2 g/dL — ABNORMAL LOW (ref 3.5–5.0)
Alkaline Phosphatase: 187 U/L — ABNORMAL HIGH (ref 38–126)
Anion gap: 9 (ref 5–15)
BUN: 12 mg/dL (ref 6–20)
CO2: 25 mmol/L (ref 22–32)
Calcium: 6.9 mg/dL — ABNORMAL LOW (ref 8.9–10.3)
Chloride: 102 mmol/L (ref 98–111)
Creatinine, Ser: 0.92 mg/dL (ref 0.44–1.00)
GFR, Estimated: >60 mL/min (ref >60)
Glucose, Bld: 124 mg/dL — ABNORMAL HIGH (ref 70–99)
Potassium: 3 mmol/L — ABNORMAL LOW (ref 3.5–5.1)
Sodium: 136 mmol/L (ref 135–145)
Total Bilirubin: 0.6 mg/dL (ref 0.3–1.2)
Total Protein: 7.5 g/dL (ref 6.5–8.1)

## 2022-09-02 LAB — IRON AND TIBC
Iron: 10 ug/dL — ABNORMAL LOW (ref 28–170)
Saturation Ratios: 4 % — ABNORMAL LOW (ref 10.4–31.8)
TIBC: 262 ug/dL (ref 250–450)
UIBC: 252 ug/dL

## 2022-09-02 LAB — CBC WITH DIFFERENTIAL/PLATELET
Abs Immature Granulocytes: 0.13 10*3/uL — ABNORMAL HIGH (ref 0.00–0.07)
Basophils Absolute: 0 10*3/uL (ref 0.0–0.1)
Basophils Relative: 0 %
Eosinophils Absolute: 0 10*3/uL (ref 0.0–0.5)
Eosinophils Relative: 0 %
HCT: 28.6 % — ABNORMAL LOW (ref 36.0–46.0)
Hemoglobin: 8.4 g/dL — ABNORMAL LOW (ref 12.0–15.0)
Immature Granulocytes: 1 %
Lymphocytes Relative: 4 %
Lymphs Abs: 0.7 10*3/uL (ref 0.7–4.0)
MCH: 24.6 pg — ABNORMAL LOW (ref 26.0–34.0)
MCHC: 29.4 g/dL — ABNORMAL LOW (ref 30.0–36.0)
MCV: 83.9 fL (ref 80.0–100.0)
Monocytes Absolute: 1 10*3/uL (ref 0.1–1.0)
Monocytes Relative: 6 %
Neutro Abs: 15 10*3/uL — ABNORMAL HIGH (ref 1.7–7.7)
Neutrophils Relative %: 89 %
Platelets: 322 10*3/uL (ref 150–400)
RBC: 3.41 MIL/uL — ABNORMAL LOW (ref 3.87–5.11)
RDW: 20.8 % — ABNORMAL HIGH (ref 11.5–15.5)
WBC: 16.9 10*3/uL — ABNORMAL HIGH (ref 4.0–10.5)
nRBC: 0 % (ref 0.0–0.2)

## 2022-09-02 LAB — LACTIC ACID, PLASMA
Lactic Acid, Venous: 1.1 mmol/L (ref 0.5–1.9)
Lactic Acid, Venous: 5 mmol/L (ref 0.5–1.9)
Lactic Acid, Venous: 2.7 mmol/L (ref 0.5–1.9)

## 2022-09-02 LAB — CULTURE, BLOOD (ROUTINE X 2)

## 2022-09-02 LAB — VITAMIN B12: Vitamin B-12: 426 pg/mL (ref 180–914)

## 2022-09-02 LAB — URINALYSIS, W/ REFLEX TO CULTURE (INFECTION SUSPECTED)
Bilirubin Urine: NEGATIVE
Glucose, UA: NEGATIVE mg/dL
Ketones, ur: NEGATIVE mg/dL
Nitrite: POSITIVE — AB
Protein, ur: 100 mg/dL — AB
Specific Gravity, Urine: 1.036 — ABNORMAL HIGH (ref 1.005–1.030)
pH: 5 (ref 5.0–8.0)

## 2022-09-02 LAB — RAPID URINE DRUG SCREEN, HOSP PERFORMED
Amphetamines: NOT DETECTED
Barbiturates: NOT DETECTED
Benzodiazepines: POSITIVE — AB
Cocaine: NOT DETECTED
Opiates: POSITIVE — AB
Tetrahydrocannabinol: NOT DETECTED

## 2022-09-02 LAB — MRSA NEXT GEN BY PCR, NASAL: MRSA by PCR Next Gen: NOT DETECTED

## 2022-09-02 LAB — LIPASE, BLOOD: Lipase: 26 U/L (ref 11–51)

## 2022-09-02 LAB — SARS CORONAVIRUS 2 BY RT PCR: SARS Coronavirus 2 by RT PCR: NEGATIVE

## 2022-09-02 LAB — FOLATE: Folate: 15.9 ng/mL (ref >5.9)

## 2022-09-02 LAB — FERRITIN: Ferritin: 31 ng/mL (ref 11–307)

## 2022-09-02 LAB — PROCALCITONIN: Procalcitonin: 18.18 ng/mL

## 2022-09-02 MED ORDER — POTASSIUM CHLORIDE IN NACL 20-0.9 MEQ/L-% IV SOLN
INTRAVENOUS | Status: AC
  Filled 2022-09-02: qty 1000

## 2022-09-02 MED ORDER — ACETAMINOPHEN 650 MG RE SUPP
650.0000 mg | Freq: Once | RECTAL | Status: AC
  Administered 2022-09-02: 650 mg via RECTAL
  Filled 2022-09-02: qty 1

## 2022-09-02 MED ORDER — SODIUM CHLORIDE 0.9 % IV SOLN
1.0000 g | INTRAVENOUS | Status: DC
  Administered 2022-09-02: 1 g via INTRAVENOUS
  Filled 2022-09-02: qty 10

## 2022-09-02 MED ORDER — MORPHINE SULFATE (PF) 4 MG/ML IV SOLN
4.0000 mg | Freq: Once | INTRAVENOUS | Status: AC
  Administered 2022-09-02: 4 mg via INTRAVENOUS
  Filled 2022-09-02: qty 1

## 2022-09-02 MED ORDER — LACTATED RINGERS IV BOLUS
1000.0000 mL | Freq: Once | INTRAVENOUS | Status: AC
  Administered 2022-09-02: 1000 mL via INTRAVENOUS

## 2022-09-02 MED ORDER — ONDANSETRON HCL 4 MG/2ML IJ SOLN
4.0000 mg | Freq: Four times a day (QID) | INTRAMUSCULAR | Status: DC
  Administered 2022-09-02 – 2022-09-09 (×26): 4 mg via INTRAVENOUS
  Filled 2022-09-02 (×26): qty 2

## 2022-09-02 MED ORDER — LINACLOTIDE 72 MCG PO CAPS
72.0000 ug | ORAL_CAPSULE | Freq: Every day | ORAL | Status: DC
  Filled 2022-09-02 (×3): qty 1

## 2022-09-02 MED ORDER — MORPHINE SULFATE (PF) 2 MG/ML IV SOLN
2.0000 mg | INTRAVENOUS | Status: DC | PRN
  Administered 2022-09-03 – 2022-09-04 (×10): 2 mg via INTRAVENOUS
  Filled 2022-09-02 (×12): qty 1

## 2022-09-02 MED ORDER — SODIUM CHLORIDE 0.9 % IV SOLN: 2.0000 g | INTRAVENOUS | Status: DC

## 2022-09-02 MED ORDER — ENOXAPARIN SODIUM 40 MG/0.4ML IJ SOSY
40.0000 mg | PREFILLED_SYRINGE | INTRAMUSCULAR | Status: DC
  Administered 2022-09-02 – 2022-09-08 (×7): 40 mg via SUBCUTANEOUS
  Filled 2022-09-02 (×7): qty 0.4

## 2022-09-02 MED ORDER — SODIUM CHLORIDE 0.9 % IV BOLUS
1000.0000 mL | Freq: Once | INTRAVENOUS | Status: AC
  Administered 2022-09-02: 1000 mL via INTRAVENOUS

## 2022-09-02 MED ORDER — HYDROXYZINE HCL 25 MG PO TABS
50.0000 mg | ORAL_TABLET | Freq: Four times a day (QID) | ORAL | Status: DC | PRN
  Administered 2022-09-02 – 2022-09-04 (×3): 50 mg via ORAL
  Filled 2022-09-02 (×4): qty 2

## 2022-09-02 MED ORDER — NICOTINE 21 MG/24HR TD PT24
21.0000 mg | MEDICATED_PATCH | Freq: Every day | TRANSDERMAL | Status: DC
  Filled 2022-09-02: qty 1

## 2022-09-02 MED ORDER — FLUOXETINE HCL 10 MG PO CAPS
10.0000 mg | ORAL_CAPSULE | Freq: Every day | ORAL | Status: DC
  Filled 2022-09-02: qty 1

## 2022-09-02 MED ORDER — SODIUM CHLORIDE 0.9 % IV SOLN
500.0000 mg | INTRAVENOUS | Status: DC
  Administered 2022-09-02 – 2022-09-05 (×4): 500 mg via INTRAVENOUS
  Filled 2022-09-02 (×5): qty 5

## 2022-09-02 MED ORDER — SODIUM CHLORIDE 0.9 % IV SOLN
1.0000 g | Freq: Once | INTRAVENOUS | Status: AC
  Administered 2022-09-02: 1 g via INTRAVENOUS
  Filled 2022-09-02: qty 10

## 2022-09-02 MED ORDER — IOHEXOL 350 MG/ML SOLN
100.0000 mL | Freq: Once | INTRAVENOUS | Status: AC | PRN
  Administered 2022-09-02: 100 mL via INTRAVENOUS

## 2022-09-02 MED ORDER — METOPROLOL SUCCINATE ER 25 MG PO TB24
25.0000 mg | ORAL_TABLET | Freq: Every day | ORAL | Status: DC
  Administered 2022-09-03 – 2022-09-04 (×2): 25 mg via ORAL
  Filled 2022-09-02 (×3): qty 1

## 2022-09-02 MED ORDER — ONDANSETRON HCL 4 MG/2ML IJ SOLN
4.0000 mg | Freq: Once | INTRAMUSCULAR | Status: AC
  Administered 2022-09-02: 4 mg via INTRAVENOUS
  Filled 2022-09-02: qty 2

## 2022-09-02 MED ORDER — TEMAZEPAM 15 MG PO CAPS
30.0000 mg | ORAL_CAPSULE | Freq: Every evening | ORAL | Status: DC | PRN
  Administered 2022-09-03 – 2022-09-08 (×5): 30 mg via ORAL
  Filled 2022-09-02 (×6): qty 2

## 2022-09-02 MED ORDER — METRONIDAZOLE 500 MG/100ML IV SOLN
500.0000 mg | Freq: Two times a day (BID) | INTRAVENOUS | Status: DC
  Administered 2022-09-02 – 2022-09-03 (×2): 500 mg via INTRAVENOUS
  Filled 2022-09-02 (×2): qty 100

## 2022-09-02 MED ORDER — PANTOPRAZOLE SODIUM 40 MG IV SOLR
40.0000 mg | Freq: Two times a day (BID) | INTRAVENOUS | Status: DC
  Administered 2022-09-02 – 2022-09-09 (×15): 40 mg via INTRAVENOUS
  Filled 2022-09-02 (×15): qty 10

## 2022-09-02 NOTE — ED Notes (Signed)
Patient transported to CT 

## 2022-09-02 NOTE — H&P (Signed)
History and Physical    Patient: [name]:[id number] DOB: Dec 13, 1970 DOA: 09/02/2022 DOS: the patient was seen and examined on 09/02/2022 PCP: Smith Robert, MD (Inactive)  Patient coming from: Home  Chief Complaint:  Chief Complaint  Patient presents with   Abdominal Pain   Emesis   HPI: [name] is a 52 year old female with a history of hypertension, chronic abdominal pain, IBS, colitis, constipation, chronic nausea presenting with worsening generalized abdominal pain Last 24 hours prior to admission.  The patient's spouse at the bedside supplements the history.  The patient is a difficult historian.  Notably, the patient's spouse states that the patient has developed chest congestion, coughing, and some dyspnea on exertion over the past week.  She continues to smoke 1 pack/day.  She has not been on any recent antibiotics.  She has had some generalized weakness.  She has had decreased oral intake.  However, in the last 24 hours prior to admission she developed numerous episodes of nausea and vomiting.  There is no hematemesis.  The patient has not had any diarrhea.  There is no hematochezia or melena.  Spouse also relates that the patient has had some symptoms of urinary urgency in the last day to 2 days.  The patient normally takes BC powders 2 times per week for pain.  There is no hematuria or hemoptysis.  There is no hematemesis. Because of her worsening condition, the patient was brought to emergency department for further evaluation and treatment.  The patient has an extensive GI history for her abdominal pain and IBS and colitis.  Notably, in April 2022 the patient was noted to have colonic wall thickening/colitis on CT.  She was  treated empirically for IBD/suspected Crohn's with prednisone and Entocort with follow-up colonoscopy in February 2023 (off all steroids for several months)   normal other than nonbleeding internal hemorrhoids and normal colon biopsies.  More  recently, the patient was seen in the emergency department on 05/27/2022 with worsening abdominal pain, persistent vomiting throughout the day. CT A/P showed diffuse colonic wall thickening extending from distal transverse colon throughout the rectosigmoid junction consistent with inflammatory or infectious colitis. She was given a 5-day course of prednisone. She also had CT angiography 06/05/2022 that showed patent mesenteric vasculature. She was prescribed a 5-day course of Cipro.  She was most recently in the GI clinic on 08/23/2022 for her abdominal pain, IBS, and possible colitis.  It was felt that her chronic abdominal pain was multifactorial including IBS, bowel hypersensitivity syndrome, and functional abdominal pain.  She was instructed to continue dicyclomine and Linzess. Regarding her chronic nausea and vomiting she has had extensive workup withMRI abdomen in 2022 without biliary abnormalities. Gastric emptying study normal in 2023 and EGD February 2023 consistent with Candida esophagitis and gastritis.  In the ED, the patient was afebrile hemodynamically stable with oxygen saturation 90% on room air.  WBC 16.9, hemoglobin 8.4, platelets 222,000.  Sodium 136, potassium 3.0, bicarbonate 25, serum creatinine 0.92.  AST 47, ALT 18, alk phosphatase 187, total bilirubin 0.6.  CTA abdomen was negative for any contrast extravasation.  Did show focal area of swelling and decreased enhancement in the left kidney with mild fat perinephric stranding.  There was bowel thickening and mucosal enhancement in the ascending and proximal transverse colon. CTA chest was negative for any aortic dissection.  There is moderate GGO bilateral with nodular consolidation. The patient was started on ceftriaxone and metronidazole.  Review of Systems: As mentioned in  the history of present illness. All other systems reviewed and are negative. Past Medical History:  Diagnosis Date   Chronic abdominal pain    Chronic back  pain    Colitis    Constipation    GERD (gastroesophageal reflux disease)    Hypertension    Migraine    Plumbism    blood clot    PONV (postoperative nausea and vomiting)    Renal mass    Uterine cancer    age 23   Past Surgical History:  Procedure Laterality Date   ABDOMINAL HYSTERECTOMY     BACK SURGERY     BIOPSY  07/07/2021   Procedure: BIOPSY;  Surgeon: Lanelle Bal, DO;  [facility];  Service: Endoscopy;;   CHOLECYSTECTOMY     Complicated by bile leak requiring ERCP with temporary stenting   COLONOSCOPY WITH ESOPHAGOGASTRODUODENOSCOPY (EGD)  07/2009   [facility]: Op notes cannot be seen through care everywhere however pathology showed terminal ileum and random colon biopsies normal.  Stomach biopsy with gastric antral and fundic mucosa with reactive foveolar hyperplasia.  No active gastritis.  Stains negative for H. pylori.   COLONOSCOPY WITH PROPOFOL N/A 07/07/2021   Surgeon: Earnest Bailey K, DO;  Non-bleeding internal hemorrhoids, normal TI  biopsied, normal colon biopsied. Pathology with colon and TI biopsies normal.   ESOPHAGEAL BRUSHING  07/07/2021   Procedure: ESOPHAGEAL BRUSHING;  Surgeon: Lanelle Bal, DO;  [facility];  Service: Endoscopy;;   ESOPHAGOGASTRODUODENOSCOPY  06/2007   [facility]: Normal esophagus, gastric mucosal abnormality with erythema, biopsy showed gastric antral and fundic mucosa with reactive foveolar hyperplasia, no gastritis, no H. pylori.   ESOPHAGOGASTRODUODENOSCOPY (EGD) WITH PROPOFOL N/A 07/07/2021   Surgeon: Lanelle Bal, DO; moderately severe Candida esophagitis, gastritis, biopsies negative for H. pylori, normal examined duodenum.  Prescribed Diflucan and recommended PPI twice daily.   HAND SURGERY     Social History:  reports that she has been smoking cigarettes and e-cigarettes. She has a 20.00 pack-year smoking history. She has never used smokeless tobacco. She reports that she does not drink alcohol  and does not use drugs.  Allergies  Allergen Reactions   Buprenorphine Other (See Comments)    Chest pain/ringing in ears and feet swelling   Penicillins Swelling and Rash        Aripiprazole Other (See Comments)    seizure   Seroquel [Quetiapine] Other (See Comments)    Seizure (08/18/20)   Cefaclor Other (See Comments)    Muscles locked up   Doxycycline Rash   Gabapentin Nausea And Vomiting    dizzy   Ibuprofen Swelling and Rash   Levofloxacin Rash    Itching   Lisinopril Nausea And Vomiting   Macrobid [Nitrofurantoin Monohyd Macro] Rash   Naproxen Swelling and Rash    Family History  Problem Relation Age of Onset   Colon cancer Maternal Grandfather        76s   Cancer Other    Seizures Other    Stroke Other    Diabetes Other    Colon cancer Maternal Uncle        50s    Prior to Admission medications   Medication Sig Start Date End Date Taking? Authorizing Provider  busPIRone (BUSPAR) 5 MG tablet 1 tablet Orally Once a day 04/17/22   [provider]  dicyclomine (BENTYL) 20 MG tablet TAKE ONE TABLET BY MOUTH THREE TIMES DAILY WITH FOOD (hold for constipation) 08/27/22  Letta Median, PA-C  famotidine (PEPCID) 20 MG tablet For break through heart burn    [provider]  FLUoxetine (PROZAC) 10 MG capsule 1 capsule Orally Once a day for 30 days    [provider]  hydrOXYzine (ATARAX/VISTARIL) 50 MG tablet Take 50 mg by mouth every 6 (six) hours as needed for anxiety. 09/29/20   [provider]  linaclotide Karlene Einstein) 72 MCG capsule Take 1 capsule (72 mcg total) by mouth daily before breakfast. 05/16/22   Aida Raider, NP  metoprolol succinate (TOPROL-XL) 100 MG 24 hr tablet Take 100 mg by mouth daily. Take with or immediately following a meal.    [provider]  ondansetron (ZOFRAN-ODT) 8 MG disintegrating tablet TAKE ONE TABLET BY MOUTH EVERY 8 HOURS 08/14/22   Mahon, Toni Amend L, NP  pantoprazole (PROTONIX) 40 MG tablet  TAKE (1) TABLET BY MOUTH TWICE A DAY BEFORE MEALS. (BREAKFAST AND SUPPER) 05/16/22   Aida Raider, NP  promethazine (PHENERGAN) 25 MG tablet TAKE ONE-HALF TABLET BY MOUTH EVERY 6 HOURS AS NEEDED FOR NAUSEA AND VOMITING 07/19/22   Aida Raider, NP  temazepam (RESTORIL) 30 MG capsule Take 30 mg by mouth at bedtime as needed for sleep.    [provider]    Physical Exam: Vitals:   09/02/22 1200 09/02/22 1315 09/02/22 1330 09/02/22 1455  BP: 134/70 135/63 138/69   Pulse:  77 80   Resp: 16 18 20    Temp:    97.8 F (36.6 C)  TempSrc:    Oral  SpO2:  93% 90%    GENERAL:  A&O x 2, NAD, well developed, cooperative, follows commands HEENT: [place]/AT, No thrush, No icterus, No oral ulcers Neck:  No neck mass, No meningismus, soft, supple CV: RRR, no S3, no S4, no rub, no JVD Lungs: Diminished breath sounds.  Bibasilar rales, regular left.  No wheezing Abd: soft/diffuse tenderness.  +BS, nondistended Ext: No edema, no lymphangitis, no cyanosis, no rashes Neuro:  CN II-XII intact, strength 4/5 in RUE, RLE, strength 4/5 LUE, LLE; sensation intact bilateral; no dysmetria; babinski equivocal  Data Reviewed: Data reviewed above in the history  Assessment and Plan: Sepsis -presented with fever (103.5) and leukocytosis -due to pyelonephritis and PNA -lactic peak 5.0 -check PCT -empiric ceftriaxone, azithro, metronidazole -continue IVF -follow blood and urine culture  Acute respiratory failure with hypoxia -Presented with tachypnea and hypoxia with saturation 90% on room air -Secondary to pneumonia in the setting of COPD -Stable on 2 L nasal cannula -Wean oxygen as tolerated for saturation greater 92%  Lobar pneumonia -Start ceftriaxone and doxycycline -Check PCT -CTA chest showed bilateral GGO with areas of nodular consolidation -check COVID  Pyelonephritis -Obtain UA and urine culture -CT abdomen pelvis showed focal area of swelling and hypoenhancement in the left kidney  with mild fat stranding and perinephric stranding -Continue ceftriaxone pending urine culture  Colonic wall thickening/colitis -GI consult -Continue ceftriaxone and metronidazole -Patient has follow-up Dr. Marletta Lor -lipase 26  Chronic abdominal pain -Felt to be multifactorial including IBS, bowel hypersensitivity, functional bowel syndrome -Judicious opioids -UDS -Continue pantoprazole  Acute anemia -FOBT negative in the ED -Check anemia panel  Chronic nausea and vomiting -Patient has had extensive workup as discussed above in the history -Start around-the-clock antiemetic -Continue pantoprazole  Chronic constipation Continue Linzess  Anxiety -Continue home dose temazepam -PDMP reviewed -continue fluoxetine -Patient receives temazepam 30 mg, #30 last refill 08/06/2022  Hypokalemia -Replete -Add potassium to IV fluids  Essential  Hypertension -continue metoprolol succinate at lower dose -hold losartan     Advance Care Planning: FULL  Consults: GI  Family Communication: spouse 4/21  Severity of Illness: The appropriate patient status for this patient is INPATIENT. Inpatient status is judged to be reasonable and necessary in order to provide the required intensity of service to ensure the patient's safety. The patient's presenting symptoms, physical exam findings, and initial radiographic and laboratory data in the context of their chronic comorbidities is felt to place them at high risk for further clinical deterioration. Furthermore, it is not anticipated that the patient will be medically stable for discharge from the hospital within 2 midnights of admission.   * I certify that at the point of admission it is my clinical judgment that the patient will require inpatient hospital care spanning beyond 2 midnights from the point of admission due to high intensity of service, high risk for further deterioration and high frequency of surveillance required.*  Author: Catarina Hartshorn, MD 09/02/2022 3:00 PM  For on call review [email or web address].

## 2022-09-02 NOTE — ED Triage Notes (Signed)
Pt c/o of been sick since yesterday. Been throwing up since yesterday, pt does say she has Colitis, pain in middle of upper stomach.

## 2022-09-02 NOTE — Progress Notes (Signed)
Responded to nursing call:  pt more restless, refusing to take po med  I came to bedside to eval patient  Subjective: Pt is pleasantly confused.  Awake and answers questions albeit confused.  "I have pain" when asked how she's doing--"all over" Denies sob.  No vomiting.  Follows one step commands.  Intermittently speaks incoherently/mumbles  Vitals:   09/02/22 1701 09/02/22 1715 09/02/22 1730 09/02/22 1750  BP:   138/71   Pulse:  98 97 94  Resp:  20 (!) 28 (!) 21  Temp: (!) 103.5 F (39.7 C)   99 F (37.2 C)  TempSrc: Rectal   Oral  SpO2:  93% 98% 94%   CV--RRR Lung--bibasilar rales.  No wheeze Abd--soft+BS/NT   Assessment/Plan: Acute metabolic Encephalopathy -essentially unchanged from initial evaluation -ok to hold non-essential po meds -HR--96--RR18--134/76--93% on 2L -continue IV abx, fluids -minimize opioids -change admit to SDU     Monica Hartshorn, DO [facility]

## 2022-09-02 NOTE — ED Provider Notes (Signed)
[place] EMERGENCY DEPARTMENT AT [facility] Provider Note   CSN: [id number] Arrival date & time: 09/02/22  1033     History  Chief Complaint  Patient presents with   Abdominal Pain   Emesis    [name] is a 52 y.o. female history of lumbar radiculopathy/DDD, colitis, GERD presented with nausea vomiting and epigastric pain that began yesterday.  Patient has not eaten since yesterday she has been throwing up.  Patient denies any emesis and states that she has a history of colitis but that this feels different.  Patient states her abdominal pain is epigastric and tender to palpation.  Patient denies any recent illnesses or fevers/chills.  Patient is unsure what triggered her abdominal pain but says that it is constant and has not taken any medications for it.  Patient had chest pain, shortness of breath, changes sensation is motor skills, dysuria, headache, fevers, sick contacts, back pain, blood thinners  Home Medications Prior to Admission medications   Medication Sig Start Date End Date Taking? Authorizing Provider  busPIRone (BUSPAR) 5 MG tablet 1 tablet Orally Once a day 04/17/22   [provider]  dicyclomine (BENTYL) 20 MG tablet TAKE ONE TABLET BY MOUTH THREE TIMES DAILY WITH FOOD (hold for constipation) 08/27/22   Letta Median, PA-C  famotidine (PEPCID) 20 MG tablet For break through heart burn    [provider]  FLUoxetine (PROZAC) 10 MG capsule 1 capsule Orally Once a day for 30 days    [provider]  hydrOXYzine (ATARAX/VISTARIL) 50 MG tablet Take 50 mg by mouth every 6 (six) hours as needed for anxiety. 09/29/20   [provider]  linaclotide Karlene Einstein) 72 MCG capsule Take 1 capsule (72 mcg total) by mouth daily before breakfast. 05/16/22   Aida Raider, NP  metoprolol succinate (TOPROL-XL) 100 MG 24 hr tablet Take 100 mg by mouth daily. Take with or immediately following a meal.    [provider]   ondansetron (ZOFRAN-ODT) 8 MG disintegrating tablet TAKE ONE TABLET BY MOUTH EVERY 8 HOURS 08/14/22   Mahon, Toni Amend L, NP  pantoprazole (PROTONIX) 40 MG tablet TAKE (1) TABLET BY MOUTH TWICE A DAY BEFORE MEALS. (BREAKFAST AND SUPPER) 05/16/22   Aida Raider, NP  promethazine (PHENERGAN) 25 MG tablet TAKE ONE-HALF TABLET BY MOUTH EVERY 6 HOURS AS NEEDED FOR NAUSEA AND VOMITING 07/19/22   Aida Raider, NP  temazepam (RESTORIL) 30 MG capsule Take 30 mg by mouth at bedtime as needed for sleep.    [provider]      Allergies    Buprenorphine, Penicillins, Aripiprazole, Seroquel [quetiapine], Cefaclor, Doxycycline, Gabapentin, Ibuprofen, Levofloxacin, Lisinopril, Macrobid [nitrofurantoin monohyd macro], and Naproxen    Review of Systems   Review of Systems  Gastrointestinal:  Positive for abdominal pain and vomiting.  See HPI  Physical Exam Updated Vital Signs BP 138/69   Pulse 80   Temp 98.7 F (37.1 C) (Oral)   Resp 20   SpO2 90%  Physical Exam Constitutional:      Appearance: She is ill-appearing.  Cardiovascular:     Rate and Rhythm: Normal rate and regular rhythm.     Pulses: Normal pulses.     Heart sounds: Normal heart sounds.  Pulmonary:     Effort: Pulmonary effort is normal. No respiratory distress.     Breath sounds: Normal breath sounds.  Abdominal:     Palpations: Abdomen is soft.     Tenderness: There is no  right CVA tenderness, left CVA tenderness or rebound.     Comments: Tenderness and guarding to epigastric region  Musculoskeletal:        General: Normal range of motion.  Skin:    General: Skin is warm and dry.     Capillary Refill: Capillary refill takes less than 2 seconds.  Neurological:     Mental Status: She is alert.     Comments: Sensation intact in all 4 limbs  Psychiatric:        Mood and Affect: Mood normal.     ED Results / Procedures / Treatments   Labs (all labs ordered are listed, but only abnormal results are  displayed) Labs Reviewed  CBC WITH DIFFERENTIAL/PLATELET - Abnormal; Notable for the following components:      Result Value   WBC 16.9 (*)    RBC 3.41 (*)    Hemoglobin 8.4 (*)    HCT 28.6 (*)    MCH 24.6 (*)    MCHC 29.4 (*)    RDW 20.8 (*)    Neutro Abs 15.0 (*)    Abs Immature Granulocytes 0.13 (*)    All other components within normal limits  COMPREHENSIVE METABOLIC PANEL - Abnormal; Notable for the following components:   Potassium 3.0 (*)    Glucose, Bld 124 (*)    Calcium 6.9 (*)    Albumin 2.0 (*)    AST 47 (*)    Alkaline Phosphatase 187 (*)    All other components within normal limits  CULTURE, BLOOD (ROUTINE X 2)  CULTURE, BLOOD (ROUTINE X 2)  LIPASE, BLOOD  LACTIC ACID, PLASMA  URINALYSIS, ROUTINE W REFLEX MICROSCOPIC  OCCULT BLOOD X 1 CARD TO LAB, STOOL  LACTIC ACID, PLASMA  TYPE AND SCREEN    EKG EKG Interpretation  Date/Time:  Sunday September 02 2022 10:48:01 EDT Ventricular Rate:  78 PR Interval:  125 QRS Duration: 86 QT Interval:  493 QTC Calculation: 562 R Axis:   86 Text Interpretation: Sinus rhythm Abnrm T, consider ischemia, anterolateral lds Prolonged QT interval Confirmed by Meridee Score [phone]) on 09/02/2022 11:25:05 AM  Radiology CT Angio Chest/Abd/Pel for Dissection W and/or W/WO  Result Date: 09/02/2022 CLINICAL DATA:  Severe abdominal pain with nausea and vomiting. Concern for acute aortic syndrome. EXAM: CT ANGIOGRAPHY CHEST, ABDOMEN AND PELVIS TECHNIQUE: Non-contrast CT of the chest was initially obtained. Multidetector CT imaging through the chest, abdomen and pelvis was performed using the standard protocol during bolus administration of intravenous contrast. Multiplanar reconstructed images and MIPs were obtained and reviewed to evaluate the vascular anatomy. RADIATION DOSE REDUCTION: This exam was performed according to the departmental dose-optimization program which includes automated exposure control, adjustment of the mA and/or kV  according to patient size and/or use of iterative reconstruction technique. CONTRAST:  OMNIPAQUE IOHEXOL 350 MG/ML SOLN COMPARISON:  CT abdomen pelvis dated 06/05/2022 and chest radiograph dated 05/16/2018. FINDINGS: CTA CHEST FINDINGS Cardiovascular: Preferential opacification of the thoracic aorta. No evidence of thoracic aortic aneurysm, intramural hematoma, or dissection. Heart is mildly enlarged. Trace pericardial effusion. Mediastinum/Nodes: No enlarged mediastinal, hilar, or axillary lymph nodes. Thyroid gland, trachea, and esophagus demonstrate no significant findings. Lungs/Pleura: Moderate bilateral ground-glass opacities with areas of nodular consolidation are noted. Trace bilateral pleural effusions. No pneumothorax. Musculoskeletal: A chronic unfused distal right clavicle fracture versus degenerative changes is noted. No acute osseous injury. Review of the MIP images confirms the above findings. CTA ABDOMEN AND PELVIS FINDINGS VASCULAR Aorta: Atherosclerotic disease without aneurysm, dissection, vasculitis  or significant stenosis. Celiac: Atherosclerotic disease without evidence of aneurysm, dissection, vasculitis or significant stenosis. SMA: Patent without evidence of aneurysm, dissection, vasculitis or significant stenosis. Renals: Both renal arteries are patent without evidence of aneurysm, dissection, vasculitis, fibromuscular dysplasia or significant stenosis. IMA: Patent without evidence of aneurysm, dissection, vasculitis or significant stenosis. Inflow: Atherosclerotic disease without evidence of aneurysm, dissection, vasculitis or significant stenosis. Veins: No obvious venous abnormality within the limitations of this arterial phase study. Review of the MIP images confirms the above findings. NON-VASCULAR Hepatobiliary: No focal liver abnormality is seen. No gallstones, gallbladder wall thickening, or biliary dilatation. Pancreas: Unremarkable. No pancreatic ductal dilatation or  surrounding inflammatory changes. Spleen: Normal in size without focal abnormality. Adrenals/Urinary Tract: Adrenal glands are unremarkable. There is a focal area of swelling and decreased enhancement involving the anterior/inferior pole of the left kidney (series 11, images 120 6-28). There is a mild fat stranding and edema in the left perinephric space. Septated cystic lesion in the upper left kidney is unchanged, measuring 3.9 x 2.9 cm. No renal calculi on the left. The right kidney appears normal, without renal calculi, focal lesion, or hydronephrosis. Bladder is unremarkable. Stomach/Bowel: Stomach is within normal limits. There is bowel wall thickening and mucosal hyperenhancement of the ascending colon and proximal transverse colon. The appendix appears normal. No evidence of bowel obstruction. Lymphatic: No abnormally enlarged lymph nodes in the abdomen or pelvis. Reproductive: Status post hysterectomy. No adnexal masses. Other: No abdominal wall hernia or abnormality. No abdominopelvic ascites. Musculoskeletal: No acute or significant osseous findings. Review of the MIP images confirms the above findings. IMPRESSION: 1. Focal area of swelling and decreased enhancement involving the anterior/inferior pole of the left kidney with mild fat stranding and edema in the left perinephric space. Findings are concerning for focal pyelonephritis. 2. Bowel wall thickening and mucosal hyperenhancement of the ascending colon and proximal transverse colon is concerning for colitis. 3. No aortic aneurysm, intramural hematoma, or dissection. 4. Moderate bilateral ground-glass opacities with areas of nodular consolidation and trace bilateral pleural effusions, likely multifocal pneumonia. 5. Mild cardiomegaly and trace pericardial effusion. 6. Septated cystic structure at the upper pole the left kidney was described as Bosniak 87F on prior MRI of the abdomen from 12/23/2020 with recommendation for 1 year follow-up. Aortic  Atherosclerosis (ICD10-I70.0). Electronically Signed   By: Romona Curls M.D.   On: 09/02/2022 13:38    Procedures .Critical Care  Performed by: Netta Corrigan, PA-C Authorized by: Netta Corrigan, PA-C   Critical care provider statement:    Critical care time (minutes):  40   Critical care time was exclusive of:  Separately billable procedures and treating other patients   Critical care was necessary to treat or prevent imminent or life-threatening deterioration of the following conditions: colitis, pyelonephritis, PNA.   Critical care was time spent personally by me on the following activities:  Blood draw for specimens, development of treatment plan with patient or surrogate, discussions with consultants, evaluation of patient's response to treatment, examination of patient, obtaining history from patient or surrogate, review of old charts, re-evaluation of patient's condition, pulse oximetry, ordering and review of laboratory studies, ordering and performing treatments and interventions and ordering and review of radiographic studies   I assumed direction of critical care for this patient from another provider in my specialty: no     Care discussed with: admitting provider       Medications Ordered in ED Medications  morphine (PF) 4 MG/ML injection 4 mg (  4 mg Intravenous Given 09/02/22 1116)  ondansetron (ZOFRAN) injection 4 mg (4 mg Intravenous Given 09/02/22 1115)  sodium chloride 0.9 % bolus 1,000 mL (0 mLs Intravenous Stopped 09/02/22 1338)  iohexol (OMNIPAQUE) 350 MG/ML injection 100 mL (100 mLs Intravenous Contrast Given 09/02/22 1252)    ED Course/ Medical Decision Making/ A&P Clinical Course as of 09/02/22 1442  Sun Sep 02, 2022  4048 52 year old female here with abdominal pain vomiting.  She is diffusely tender.  Getting labs CT imaging.  Disposition per results of testing. [MB]    Clinical Course User Index [MB] Terrilee Files, MD                             Medical  Decision Making Amount and/or Complexity of Data Reviewed Labs: ordered. Radiology: ordered.  Risk Prescription drug management.   ULANDA TACKETT 52 y.o. presented today for epigastric pain. Working DDx that I considered at this time includes, but not limited to, AAA, gastroenteritis, colitis, small bowel obstruction, appendicitis, cholecystitis, pancreatitis, nephrolithiasis, UTI, pyleonephritis, ruptured ectopic pregnancy, PID, ovarian torsion.  R/o DDx: AAA, aortic dissection, SBO, HGE, appendicitis, cholecystitis, pancreatitis, nephrolithiasis, UTI, ruptured ectopic pregnancy, PID, ovarian torsion: These are considered less likely due to history of present illness and physical exam findings.  Review of prior external notes: 05/27/2022 ED provider  Unique Tests and My Interpretation:  CBC with differential: Leukocytosis 16 9, anemia 8.4; these results are new when compared to results from 3 months ago CMP: Hypokalemia 3.0, hypocalcemia 6.9 Lipase: Negative UA: Pending EKG: Sinus 79 bpm, new T wave versions in V2, prolonged QT which has been present for some time CTA dissection study:  FOBT: Negative Type and screen: A positive, antibody negative Blood cultures: Pending  Discussion with Independent Historian:  Husband  Discussion of Management of Tests:  Tat, MD Hospitalist  Risk: High: hospitalization or escalation of hospital-level care  Risk Stratification Score: none  Staffed with Charm Barges, MD   Plan: Patient presented for abdominal pain. On exam patient was ill-appearing but had stable vitals.  Patient's physical exam was significant for epigastric tenderness and guarding however abdomen was soft.  Patient's rest of physical exam was unremarkable.  Patient that she has a history of colitis however this feels different and denied any recent illnesses, fevers, hematemesis.  Husband states patient has not been able to take in fluids or food since yesterday.  Patient was given  fluids, Zofran, morphine for pain along with labs and imaging.  Patient stable at this time.  Patient's labs came back significant for leukocytosis of 16.9 and new anemia of 8.4 when compared to results 3 months ago.  Patient denied any hematochezia and a FOBT was performed that was ultimately negative.  Patient blood pressure did drop to 85/75 but did jump back up to 135/70 as she was receiving fluids.  Patient still endorses severe epigastric abdominal pain after receiving morphine and abdomen still feels soft but there is still guarding and tenderness to the epigastric area.  Patient's labs also did show hypokalemia and hypocalcemia which I suspect is from patient's emesis.  Due to patient's symptoms and overall presentation a dissection study was ordered to rule out dissection due to patient's new anemia and continuing severe abdominal pain.  CT showed multiple concerns including multifocal pneumonia, pyelonephritis, colitis, with trace pericardial effusion.  Patient at this point is stable however will need hospitalization with IV antibiotics.  I tried speaking  to the pharmacist however he was put away for trauma and so IV antibiotics were not started in the ED.  Hospitalist was consulted and accepted patient for admission.  Patient stable for admission at this time.         Final Clinical Impression(s) / ED Diagnoses Final diagnoses:  Colitis  Multifocal pneumonia  Pyelonephritis  Cardiomegaly  Pericardial effusion without cardiac tamponade    Rx / DC Orders ED Discharge Orders     None         Remi Deter 09/02/22 1442    Terrilee Files, MD 09/02/22 1735

## 2022-09-02 NOTE — Progress Notes (Signed)
Re-evaluated patient at 1831   Subjective: She is confused.  Complains of abdominal pain.  ROS limited due to confusion.  States name, but otherwise mumbles.  Follows simple one step commands   Vitals:   09/02/22 1750 09/02/22 1800 09/02/22 1821 09/02/22 1830  BP:  (!) 151/75  (!) 140/67  Pulse: 94 (!) 101  92  Resp: (!) 21 20  (!) 24  Temp: 99 F (37.2 C)  98.5 F (36.9 C)   TempSrc: Oral  Oral   SpO2: 94% 94%  94%   CV--RRR Lung--bibasilar rales, R>L Abd--soft+BS/diffuse, no distension Neuro:  CN II-XII intact, strength 4-/5 in RUE, RLE, strength 4-/5 LUE, LLE; sensation intact bilateral; no dysmetria; babinski equivocal    Assessment/Plan: Delirium -T985--HR92--RR22--BP 140/67--96 % on 2L -essentially unchanged from prior -change admit to tele -repeat lactic 5.0>>2.7 -give additional LR bolus     Catarina Hartshorn, DO [facility]

## 2022-09-02 NOTE — Hospital Course (Signed)
52 year old female with a history of hypertension, chronic abdominal pain, IBS, colitis, constipation, chronic nausea presenting with worsening generalized abdominal pain Last 24 hours prior to admission.  The patient's spouse at the bedside supplements the history.  The patient is a difficult historian.  Notably, the patient's spouse states that the patient has developed chest congestion, coughing, and some dyspnea on exertion over the past week.  She continues to smoke 1 pack/day.  She has not been on any recent antibiotics.  She has had some generalized weakness.  She has had decreased oral intake.  However, in the last 24 hours prior to admission she developed numerous episodes of nausea and vomiting.  There is no hematemesis.  The patient has not had any diarrhea.  There is no hematochezia or melena.  Spouse also relates that the patient has had some symptoms of urinary urgency in the last day to 2 days.  The patient normally takes BC powders 2 times per week for pain.  There is no hematuria or hemoptysis.  There is no hematemesis. Because of her worsening condition, the patient was brought to emergency department for further evaluation and treatment.  The patient has an extensive GI history for her abdominal pain and IBS and colitis.  Notably, in April 2022 the patient was noted to have colonic wall thickening/colitis on CT.  She was  treated empirically for IBD/suspected Crohn's with prednisone and Entocort with follow-up colonoscopy in February 2023 (off all steroids for several months)   normal other than nonbleeding internal hemorrhoids and normal colon biopsies.  More recently, the patient was seen in the emergency department on 05/27/2022 with worsening abdominal pain, persistent vomiting throughout the day. CT A/P showed diffuse colonic wall thickening extending from distal transverse colon throughout the rectosigmoid junction consistent with inflammatory or infectious colitis. She was given a  5-day course of prednisone. She also had CT angiography 06/05/2022 that showed patent mesenteric vasculature. She was prescribed a 5-day course of Cipro.  She was most recently in the GI clinic on 08/23/2022 for her abdominal pain, IBS, and possible colitis.  It was felt that her chronic abdominal pain was multifactorial including IBS, bowel hypersensitivity syndrome, and functional abdominal pain.  She was instructed to continue dicyclomine and Linzess. Regarding her chronic nausea and vomiting she has had extensive workup withMRI abdomen in 2022 without biliary abnormalities. Gastric emptying study normal in 2023 and EGD February 2023 consistent with Candida esophagitis and gastritis.  In the ED, the patient was afebrile hemodynamically stable with oxygen saturation 90% on room air.  WBC 16.9, hemoglobin 8.4, platelets 222,000.  Sodium 136, potassium 3.0, bicarbonate 25, serum creatinine 0.92.  AST 47, ALT 18, alk phosphatase 187, total bilirubin 0.6.  CTA abdomen was negative for any contrast extravasation.  Did show focal area of swelling and decreased enhancement in the left kidney with mild fat perinephric stranding.  There was bowel thickening and mucosal enhancement in the ascending and proximal transverse colon. CTA chest was negative for any aortic dissection.  There is moderate GGO bilateral with nodular consolidation. The patient was started on ceftriaxone and metronidazole.

## 2022-09-03 ENCOUNTER — Encounter (HOSPITAL_COMMUNITY): Payer: Self-pay | Admitting: Internal Medicine

## 2022-09-03 DIAGNOSIS — D649 Anemia, unspecified: Secondary | ICD-10-CM | POA: Diagnosis not present

## 2022-09-03 DIAGNOSIS — R7881 Bacteremia: Secondary | ICD-10-CM | POA: Insufficient documentation

## 2022-09-03 DIAGNOSIS — J181 Lobar pneumonia, unspecified organism: Secondary | ICD-10-CM | POA: Diagnosis not present

## 2022-09-03 DIAGNOSIS — N12 Tubulo-interstitial nephritis, not specified as acute or chronic: Secondary | ICD-10-CM | POA: Diagnosis not present

## 2022-09-03 DIAGNOSIS — A4151 Sepsis due to Escherichia coli [E. coli]: Secondary | ICD-10-CM | POA: Diagnosis present

## 2022-09-03 DIAGNOSIS — A403 Sepsis due to Streptococcus pneumoniae: Secondary | ICD-10-CM | POA: Diagnosis not present

## 2022-09-03 DIAGNOSIS — K529 Noninfective gastroenteritis and colitis, unspecified: Secondary | ICD-10-CM | POA: Diagnosis not present

## 2022-09-03 DIAGNOSIS — G894 Chronic pain syndrome: Secondary | ICD-10-CM | POA: Diagnosis not present

## 2022-09-03 HISTORY — DX: Sepsis due to Escherichia coli (e. coli): A41.51

## 2022-09-03 HISTORY — DX: Bacteremia: R78.81

## 2022-09-03 LAB — BLOOD CULTURE ID PANEL (REFLEXED) - BCID2

## 2022-09-03 LAB — RESPIRATORY PANEL BY PCR

## 2022-09-03 LAB — HIV ANTIBODY (ROUTINE TESTING W REFLEX): HIV Screen 4th Generation wRfx: NONREACTIVE

## 2022-09-03 LAB — COMPREHENSIVE METABOLIC PANEL
ALT: 28 U/L (ref 0–44)
AST: 83 U/L — ABNORMAL HIGH (ref 15–41)
Albumin: 1.6 g/dL — ABNORMAL LOW (ref 3.5–5.0)
Alkaline Phosphatase: 155 U/L — ABNORMAL HIGH (ref 38–126)
Anion gap: 9 (ref 5–15)
BUN: 11 mg/dL (ref 6–20)
CO2: 20 mmol/L — ABNORMAL LOW (ref 22–32)
Calcium: 6.1 mg/dL — CL (ref 8.9–10.3)
Chloride: 110 mmol/L (ref 98–111)
Creatinine, Ser: 0.8 mg/dL (ref 0.44–1.00)
GFR, Estimated: >60 mL/min (ref >60)
Glucose, Bld: 127 mg/dL — ABNORMAL HIGH (ref 70–99)
Potassium: 3.4 mmol/L — ABNORMAL LOW (ref 3.5–5.1)
Sodium: 139 mmol/L (ref 135–145)
Total Bilirubin: 0.3 mg/dL (ref 0.3–1.2)
Total Protein: 6 g/dL — ABNORMAL LOW (ref 6.5–8.1)

## 2022-09-03 LAB — MAGNESIUM: Magnesium: 1.1 mg/dL — ABNORMAL LOW (ref 1.7–2.4)

## 2022-09-03 LAB — CULTURE, BLOOD (ROUTINE X 2)

## 2022-09-03 LAB — OCCULT BLOOD, POC DEVICE: Fecal Occult Bld: NEGATIVE

## 2022-09-03 MED ORDER — SODIUM CHLORIDE 0.9 % IV SOLN
1.0000 g | Freq: Three times a day (TID) | INTRAVENOUS | Status: DC
  Administered 2022-09-03: 1 g via INTRAVENOUS
  Filled 2022-09-03 (×2): qty 20

## 2022-09-03 MED ORDER — FLUOXETINE HCL 20 MG PO CAPS
40.0000 mg | ORAL_CAPSULE | Freq: Every day | ORAL | Status: DC
  Administered 2022-09-03 – 2022-09-09 (×7): 40 mg via ORAL
  Filled 2022-09-03 (×7): qty 2

## 2022-09-03 MED ORDER — MAGNESIUM SULFATE 2 GM/50ML IV SOLN
2.0000 g | Freq: Once | INTRAVENOUS | Status: AC
  Administered 2022-09-03: 2 g via INTRAVENOUS
  Filled 2022-09-03: qty 50

## 2022-09-03 MED ORDER — CALCIUM GLUCONATE-NACL 1-0.675 GM/50ML-% IV SOLN
1.0000 g | Freq: Once | INTRAVENOUS | Status: AC
  Administered 2022-09-03: 1000 mg via INTRAVENOUS
  Filled 2022-09-03: qty 50

## 2022-09-03 MED ORDER — SODIUM CHLORIDE 0.9 % IV SOLN
2.0000 g | Freq: Three times a day (TID) | INTRAVENOUS | Status: DC
  Filled 2022-09-03 (×5): qty 40

## 2022-09-03 MED ORDER — SODIUM CHLORIDE 0.9 % IV SOLN
1.0000 g | Freq: Three times a day (TID) | INTRAVENOUS | Status: DC
  Administered 2022-09-03 – 2022-09-08 (×14): 1 g via INTRAVENOUS
  Filled 2022-09-03 (×15): qty 20

## 2022-09-03 MED ORDER — POTASSIUM CHLORIDE CRYS ER 20 MEQ PO TBCR
20.0000 meq | EXTENDED_RELEASE_TABLET | Freq: Once | ORAL | Status: AC
  Administered 2022-09-03: 20 meq via ORAL
  Filled 2022-09-03: qty 1

## 2022-09-03 NOTE — TOC Progression Note (Signed)
Transition of Care [facility]) - Progression Note    Patient Details  Name: ELEORA SUTHERLAND MRN: [id number] Date of Birth: Sep 17, 1970  [facility]) CM/SW Contact  Karn Cassis, [place] Phone Number: 09/03/2022, 10:18 AM  Clinical Narrative:   Transition of Care [facility]) Screening Note   Patient Details  Name: CELESE BANNER Date of Birth: 08-05-1970   Transition of Care [facility]) CM/SW Contact:    Karn Cassis, LCSW Phone Number: 09/03/2022, 10:18 AM    Transition of Care Department [facility]) has reviewed patient and no TOC needs have been identified at this time. We will continue to monitor patient advancement through interdisciplinary progression rounds. If new patient transition needs arise, please place a TOC consult.         Barriers to Discharge: Continued Medical Work up  Expected Discharge Plan and Services                                               Social Determinants of Health (SDOH) Interventions SDOH Screenings   Food Insecurity: No Food Insecurity (03/19/2017)  Transportation Needs: No Transportation Needs (03/19/2017)  Depression (PHQ2-9): Low Risk  (11/29/2020)  Financial Resource Strain: Low Risk  (03/19/2017)  Physical Activity: Inactive (03/19/2017)  Social Connections: Somewhat Isolated (03/19/2017)  Stress: Stress Concern Present (03/19/2017)  Tobacco Use: High Risk (09/02/2022)    Readmission Risk Interventions     No data to display

## 2022-09-03 NOTE — Progress Notes (Signed)
PROGRESS NOTE  [name] JXB:[id number] DOB: 1970-06-17 DOA: 09/02/2022 PCP: Smith Robert, MD (Inactive)  Brief History:  52 year old female with a history of hypertension, chronic abdominal pain, IBS, colitis, constipation, chronic nausea presenting with worsening generalized abdominal pain Last 24 hours prior to admission.  The patient's spouse at the bedside supplements the history.  The patient is a difficult historian.  Notably, the patient's spouse states that the patient has developed chest congestion, coughing, and some dyspnea on exertion over the past week.  She continues to smoke 1 pack/day.  She has not been on any recent antibiotics.  She has had some generalized weakness.  She has had decreased oral intake.  However, in the last 24 hours prior to admission she developed numerous episodes of nausea and vomiting.  There is no hematemesis.  The patient has not had any diarrhea.  There is no hematochezia or melena.  Spouse also relates that the patient has had some symptoms of urinary urgency in the last day to 2 days.  The patient normally takes BC powders 2 times per week for pain.  There is no hematuria or hemoptysis.  There is no hematemesis. Because of her worsening condition, the patient was brought to emergency department for further evaluation and treatment.  The patient has an extensive GI history for her abdominal pain and IBS and colitis.  Notably, in April 2022 the patient was noted to have colonic wall thickening/colitis on CT.  She was  treated empirically for IBD/suspected Crohn's with prednisone and Entocort with follow-up colonoscopy in February 2023 (off all steroids for several months)   normal other than nonbleeding internal hemorrhoids and normal colon biopsies.  More recently, the patient was seen in the emergency department on 05/27/2022 with worsening abdominal pain, persistent vomiting throughout the day. CT A/P showed diffuse colonic wall thickening  extending from distal transverse colon throughout the rectosigmoid junction consistent with inflammatory or infectious colitis. She was given a 5-day course of prednisone. She also had CT angiography 06/05/2022 that showed patent mesenteric vasculature. She was prescribed a 5-day course of Cipro.  She was most recently in the GI clinic on 08/23/2022 for her abdominal pain, IBS, and possible colitis.  It was felt that her chronic abdominal pain was multifactorial including IBS, bowel hypersensitivity syndrome, and functional abdominal pain.  She was instructed to continue dicyclomine and Linzess. Regarding her chronic nausea and vomiting she has had extensive workup withMRI abdomen in 2022 without biliary abnormalities. Gastric emptying study normal in 2023 and EGD February 2023 consistent with Candida esophagitis and gastritis.  In the ED, the patient was afebrile hemodynamically stable with oxygen saturation 90% on room air.  WBC 16.9, hemoglobin 8.4, platelets 222,000.  Sodium 136, potassium 3.0, bicarbonate 25, serum creatinine 0.92.  AST 47, ALT 18, alk phosphatase 187, total bilirubin 0.6.  CTA abdomen was negative for any contrast extravasation.  Did show focal area of swelling and decreased enhancement in the left kidney with mild fat perinephric stranding.  There was bowel thickening and mucosal enhancement in the ascending and proximal transverse colon. CTA chest was negative for any aortic dissection.  There is moderate GGO bilateral with nodular consolidation. The patient was started on ceftriaxone and metronidazole.   Assessment/Plan:  Sepsis -presented with fever (103.5) and leukocytosis -due to bacteremia, pyelonephritis and PNA -lactic peak 5.0 -check PCT 18.18 -empiric ceftriaxone, azithro, metronidazole -continue IVF -follow blood and urine culture  E. coli  bacteremia -Preliminary multiplex PCR suggest ESBL -Discontinue ceftriaxone -Started meropenem -Discontinue  metronidazole   Acute respiratory failure with hypoxia -Presented with tachypnea and hypoxia with saturation 90% on room air -Secondary to pneumonia in the setting of COPD -Stable on 2 L nasal cannula -Wean oxygen as tolerated for saturation greater 92%   Lobar pneumonia -Start ceftriaxone and doxycycline -Check PCT -CTA chest showed bilateral GGO with areas of nodular consolidation -check COVID--neg   Pyelonephritis -Obtain UA--21-50 WBC -Urine culture was not done as ordered -CT abdomen pelvis showed focal area of swelling and hypoenhancement in the left kidney with mild fat stranding and perinephric stranding -Continue meropenem as discussed above   Colonic wall thickening/colitis -GI consult appreciated -Continue meropenem as discussed above -Patient has follow-up Dr. Marletta Lor -lipase 26   Chronic abdominal pain -Felt to be multifactorial including IBS, bowel hypersensitivity, functional bowel syndrome -Judicious opioids -UDS--positive for opiates and benzodiazepines -Continue pantoprazole   Acute anemia -FOBT negative in the ED -Iron saturation 4%, ferritin 31   Chronic nausea and vomiting -Patient has had extensive workup as discussed above in the history -Start around-the-clock antiemetic -Continue pantoprazole   Chronic constipation Continue Linzess   Anxiety -Continue home dose temazepam -PDMP reviewed -continue fluoxetine -Patient receives temazepam 30 mg, #30 last refill 08/06/2022   Hypokalemia -Repleted -Add potassium to IV fluids  Hypomagnesemia/hypocalcemia -09/03/2022 corrected calcium 8.0   Essential Hypertension -continue metoprolol succinate at lower dose -hold losartan   Acute metabolic encephalopathy -Secondary to infectious process -09/03/2022--improving      Family Communication:   Spouse updated 4/22  Consultants:  GI  Code Status:  FULL   DVT Prophylaxis:  [place] Lovenox   Procedures: As Listed in Progress Note  Above  Antibiotics: Meropenem 4/22>> Ceftriaxone 4/21>>4/22 Metronidazole 4/21>>4/22      Subjective: Patient complains of pain all over.  She complains of some abdominal pain but better than yesterday.  She denies any chest pain but has some shortness of breath.  She has a nonproductive cough.  She has some nausea without emesis.  There is no diarrhea.  She denies any fevers or chills.  Objective: Vitals:   09/03/22 0023 09/03/22 0402 09/03/22 0751 09/03/22 1045  BP: (!) 145/82 (!) 149/86 (!) 153/88 (!) 159/88  Pulse: 75 79 85 81  Resp: 15 18 (!) 22 18  Temp: 98.4 F (36.9 C) (!) 97.5 F (36.4 C)  97.9 F (36.6 C)  TempSrc: Oral Oral  Oral  SpO2: 100% 100% 100% 100%    Intake/Output Summary (Last 24 hours) at 09/03/2022 1134 Last data filed at 09/03/2022 0900 Gross per 24 hour  Intake 3871.4 ml  Output 600 ml  Net 3271.4 ml   Weight change:  Exam:  General:  Pt is alert, follows commands appropriately, not in acute distress HEENT: No icterus, No thrush, No neck mass, [place]/AT Cardiovascular: RRR, S1/S2, no rubs, no gallops Respiratory: Bibasilar rales.  No wheezing.  Diminished breath sounds. Abdomen: Soft/+BS, diffuse tender, non distended, no guarding Extremities: No edema, No lymphangitis, No petechiae, No rashes, no synovitis   Data Reviewed: I have personally reviewed following labs and imaging studies Basic Metabolic Panel: Recent Labs  Lab 09/02/22 1114 09/03/22 0450  NA 136 139  K 3.0* 3.4*  CL 102 110  CO2 25 20*  GLUCOSE 124* 127*  BUN 12 11  CREATININE 0.92 0.80  CALCIUM 6.9* 6.1*  MG  --  1.1*   Liver Function Tests: Recent Labs  Lab 09/02/22 1114 09/03/22 0450  AST 47* 83*  ALT 18 28  ALKPHOS 187* 155*  BILITOT 0.6 0.3  PROT 7.5 6.0*  ALBUMIN 2.0* 1.6*   Recent Labs  Lab 09/02/22 1114  LIPASE 26   No results for input(s): "AMMONIA" in the last 168 hours. Coagulation Profile: No results for input(s): "INR", "PROTIME" in the  last 168 hours. CBC: Recent Labs  Lab 09/02/22 1114  WBC 16.9*  NEUTROABS 15.0*  HGB 8.4*  HCT 28.6*  MCV 83.9  PLT 322   Cardiac Enzymes: No results for input(s): "CKTOTAL", "CKMB", "CKMBINDEX", "TROPONINI" in the last 168 hours. BNP: Invalid input(s): "POCBNP" CBG: No results for input(s): "GLUCAP" in the last 168 hours. HbA1C: No results for input(s): "HGBA1C" in the last 72 hours. Urine analysis:    Component Value Date/Time   COLORURINE YELLOW 09/02/2022 1754   APPEARANCEUR HAZY (A) 09/02/2022 1754   LABSPEC 1.036 (H) 09/02/2022 1754   PHURINE 5.0 09/02/2022 1754   GLUCOSEU NEGATIVE 09/02/2022 1754   HGBUR SMALL (A) 09/02/2022 1754   BILIRUBINUR NEGATIVE 09/02/2022 1754   KETONESUR NEGATIVE 09/02/2022 1754   PROTEINUR 100 (A) 09/02/2022 1754   UROBILINOGEN 0.2 03/24/2013 1422   NITRITE POSITIVE (A) 09/02/2022 1754   LEUKOCYTESUR SMALL (A) 09/02/2022 1754   Sepsis Labs: @LABRCNTIP (procalcitonin:4,lacticidven:4) ) Recent Results (from the past 240 hour(s))  Culture, blood (routine x 2)     Status: None (Preliminary result)   Collection Time: 09/02/22  2:57 PM   Specimen: Blood  Result Value Ref Range Status   Specimen Description BOTTLES DRAWN AEROBIC ONLY BLOOD RIGHT HAND  Final   Special Requests Blood Culture adequate volume  Final   Culture   Final    NO GROWTH < 12 HOURS Performed at [facility], [address], [place], [place] [address]    Report Status PENDING  Incomplete  Culture, blood (routine x 2)     Status: None (Preliminary result)   Collection Time: 09/02/22  2:57 PM   Specimen: Left Antecubital; Blood  Result Value Ref Range Status   Specimen Description   Final    LEFT ANTECUBITAL BOTTLES DRAWN AEROBIC AND ANAEROBIC Performed at [facility], [address], [place], [place] [address]    Special Requests   Final    Blood Culture adequate volume Performed at [facility], [address] [place], [place] [address]    Culture  Setup Time    Final    GRAM NEGATIVE RODS Gram Stain Report Called to,Read Back By and Verified With: HAILE,A @ 0239 ON 09/03/22 BY JUW ANAEROBIC BOTTLE ONLY GS DONE @ [facility] CRITICAL RESULT CALLED TO, READ BACK BY AND VERIFIED WITH: A. HAILE RN 09/03/22 @ 0547 BY AB Performed at [facility], [address] [place], [place] [address]    Culture GRAM NEGATIVE RODS  Final   Report Status PENDING  Incomplete  Blood Culture ID Panel (Reflexed)     Status: Abnormal   Collection Time: 09/02/22  2:57 PM  Result Value Ref Range Status   Enterococcus faecalis NOT DETECTED NOT DETECTED Final   Enterococcus Faecium NOT DETECTED NOT DETECTED Final   Listeria monocytogenes NOT DETECTED NOT DETECTED Final   Staphylococcus species NOT DETECTED NOT DETECTED Final   Staphylococcus aureus (BCID) NOT DETECTED NOT DETECTED Final   Staphylococcus epidermidis NOT DETECTED NOT DETECTED Final   Staphylococcus lugdunensis NOT DETECTED NOT DETECTED Final   Streptococcus species NOT DETECTED NOT DETECTED Final   Streptococcus agalactiae NOT DETECTED NOT DETECTED Final  Streptococcus pneumoniae NOT DETECTED NOT DETECTED Final   Streptococcus pyogenes NOT DETECTED NOT DETECTED Final   A.calcoaceticus-baumannii NOT DETECTED NOT DETECTED Final   Bacteroides fragilis NOT DETECTED NOT DETECTED Final   Enterobacterales DETECTED (A) NOT DETECTED Final    Comment: Enterobacterales represent a large order of gram negative bacteria, not a single organism. CRITICAL RESULT CALLED TO, READ BACK BY AND VERIFIED WITH: A. HAILE RN 09/03/22 @ 0547 BY AB    Enterobacter cloacae complex NOT DETECTED NOT DETECTED Final   Escherichia coli DETECTED (A) NOT DETECTED Final    Comment: CRITICAL RESULT CALLED TO, READ BACK BY AND VERIFIED WITH: A. HAILE RN 09/03/22 @ 0547 BY AB    Klebsiella aerogenes NOT DETECTED NOT DETECTED Final   Klebsiella oxytoca NOT DETECTED NOT DETECTED Final   Klebsiella pneumoniae NOT DETECTED NOT DETECTED Final    Proteus species NOT DETECTED NOT DETECTED Final   Salmonella species NOT DETECTED NOT DETECTED Final   Serratia marcescens NOT DETECTED NOT DETECTED Final   Haemophilus influenzae NOT DETECTED NOT DETECTED Final   Neisseria meningitidis NOT DETECTED NOT DETECTED Final   Pseudomonas aeruginosa NOT DETECTED NOT DETECTED Final   Stenotrophomonas maltophilia NOT DETECTED NOT DETECTED Final   Candida albicans NOT DETECTED NOT DETECTED Final   Candida auris NOT DETECTED NOT DETECTED Final   Candida glabrata NOT DETECTED NOT DETECTED Final   Candida krusei NOT DETECTED NOT DETECTED Final   Candida parapsilosis NOT DETECTED NOT DETECTED Final   Candida tropicalis NOT DETECTED NOT DETECTED Final   Cryptococcus neoformans/gattii NOT DETECTED NOT DETECTED Final   CTX-M ESBL DETECTED (A) NOT DETECTED Final    Comment: CRITICAL RESULT CALLED TO, READ BACK BY AND VERIFIED WITH: A. HAILE RN 09/03/22 @ 0547 BY AB (NOTE) Extended spectrum beta-lactamase detected. Recommend a carbapenem as initial therapy.      Carbapenem resistance IMP NOT DETECTED NOT DETECTED Final   Carbapenem resistance KPC NOT DETECTED NOT DETECTED Final   Carbapenem resistance NDM NOT DETECTED NOT DETECTED Final   Carbapenem resist OXA 48 LIKE NOT DETECTED NOT DETECTED Final   Carbapenem resistance VIM NOT DETECTED NOT DETECTED Final    Comment: Performed at [facility], [address], [place], [place] [address]  SARS Coronavirus 2 by RT PCR (hospital order, performed in [facility] hospital lab) *cepheid single result test* Anterior Nasal Swab     Status: None   Collection Time: 09/02/22  3:32 PM   Specimen: Anterior Nasal Swab  Result Value Ref Range Status   SARS Coronavirus 2 by RT PCR NEGATIVE NEGATIVE Final    Comment: (NOTE) SARS-CoV-2 target nucleic acids are NOT DETECTED.  The SARS-CoV-2 RNA is generally detectable in upper and lower respiratory specimens during the acute phase of infection. The  lowest concentration of SARS-CoV-2 viral copies this assay can detect is 250 copies / mL. A negative result does not preclude SARS-CoV-2 infection and should not be used as the sole basis for treatment or other patient management decisions.  A negative result may occur with improper specimen collection / handling, submission of specimen other than nasopharyngeal swab, presence of viral mutation(s) within the areas targeted by this assay, and inadequate number of viral copies (<250 copies / mL). A negative result must be combined with clinical observations, patient history, and epidemiological information.  Fact Sheet for Patients:   RoadLapTop.co.za  Fact Sheet for Healthcare Providers: http://kim-miller.com/  This test is not yet approved or  cleared by the [place] FDA and has been authorized for detection and/or diagnosis of SARS-CoV-2 by FDA under an Emergency Use Authorization (EUA).  This EUA will remain in effect (meaning this test can be used) for the duration of the COVID-19 declaration under Section 564(b)(1) of the Act, 21 U.S.C. section 360bbb-3(b)(1), unless the authorization is terminated or revoked sooner.  Performed at [facility], [address], [place], [place] [address]   MRSA Next Gen by PCR, Nasal     Status: None   Collection Time: 09/02/22  6:27 PM   Specimen: Nasal Mucosa; Nasal Swab  Result Value Ref Range Status   MRSA by PCR Next Gen NOT DETECTED NOT DETECTED Final    Comment: (NOTE) The GeneXpert MRSA Assay (FDA approved for NASAL specimens only), is one component of a comprehensive MRSA colonization surveillance program. It is not intended to diagnose MRSA infection nor to guide or monitor treatment for MRSA infections. Test performance is not FDA approved in patients less than 69 years old. Performed at [facility], [address] [place], [place] [address]      Scheduled Meds:  enoxaparin  (LOVENOX) injection  40 mg Subcutaneous Q24H   FLUoxetine  40 mg Oral Daily   linaclotide  72 mcg Oral QAC breakfast   metoprolol succinate  25 mg Oral Daily   nicotine  21 mg Transdermal Daily   ondansetron (ZOFRAN) IV  4 mg Intravenous Q6H   pantoprazole (PROTONIX) IV  40 mg Intravenous Q12H   Continuous Infusions:  0.9 % NaCl with KCl 20 mEq / L 75 mL/hr at 09/02/22 2300   azithromycin Stopped (09/02/22 1731)   magnesium sulfate bolus IVPB 2 g (09/03/22 1043)   meropenem (MERREM) IV     metronidazole 500 mg (09/03/22 0330)    Procedures/Studies: CT Angio Chest/Abd/Pel for Dissection W and/or W/WO  Result Date: 09/02/2022 CLINICAL DATA:  Severe abdominal pain with nausea and vomiting. Concern for acute aortic syndrome. EXAM: CT ANGIOGRAPHY CHEST, ABDOMEN AND PELVIS TECHNIQUE: Non-contrast CT of the chest was initially obtained. Multidetector CT imaging through the chest, abdomen and pelvis was performed using the standard protocol during bolus administration of intravenous contrast. Multiplanar reconstructed images and MIPs were obtained and reviewed to evaluate the vascular anatomy. RADIATION DOSE REDUCTION: This exam was performed according to the departmental dose-optimization program which includes automated exposure control, adjustment of the mA and/or kV according to patient size and/or use of iterative reconstruction technique. CONTRAST:  OMNIPAQUE IOHEXOL 350 MG/ML SOLN COMPARISON:  CT abdomen pelvis dated 06/05/2022 and chest radiograph dated 05/16/2018. FINDINGS: CTA CHEST FINDINGS Cardiovascular: Preferential opacification of the thoracic aorta. No evidence of thoracic aortic aneurysm, intramural hematoma, or dissection. Heart is mildly enlarged. Trace pericardial effusion. Mediastinum/Nodes: No enlarged mediastinal, hilar, or axillary lymph nodes. Thyroid gland, trachea, and esophagus demonstrate no significant findings. Lungs/Pleura: Moderate bilateral ground-glass opacities  with areas of nodular consolidation are noted. Trace bilateral pleural effusions. No pneumothorax. Musculoskeletal: A chronic unfused distal right clavicle fracture versus degenerative changes is noted. No acute osseous injury. Review of the MIP images confirms the above findings. CTA ABDOMEN AND PELVIS FINDINGS VASCULAR Aorta: Atherosclerotic disease without aneurysm, dissection, vasculitis or significant stenosis. Celiac: Atherosclerotic disease without evidence of aneurysm, dissection, vasculitis or significant stenosis. SMA: Patent without evidence of aneurysm, dissection, vasculitis or significant stenosis. Renals: Both renal arteries are patent without evidence of aneurysm, dissection, vasculitis, fibromuscular dysplasia or significant stenosis. IMA: Patent without evidence of aneurysm, dissection, vasculitis or significant stenosis. Inflow: Atherosclerotic disease  without evidence of aneurysm, dissection, vasculitis or significant stenosis. Veins: No obvious venous abnormality within the limitations of this arterial phase study. Review of the MIP images confirms the above findings. NON-VASCULAR Hepatobiliary: No focal liver abnormality is seen. No gallstones, gallbladder wall thickening, or biliary dilatation. Pancreas: Unremarkable. No pancreatic ductal dilatation or surrounding inflammatory changes. Spleen: Normal in size without focal abnormality. Adrenals/Urinary Tract: Adrenal glands are unremarkable. There is a focal area of swelling and decreased enhancement involving the anterior/inferior pole of the left kidney (series 11, images 120 6-28). There is a mild fat stranding and edema in the left perinephric space. Septated cystic lesion in the upper left kidney is unchanged, measuring 3.9 x 2.9 cm. No renal calculi on the left. The right kidney appears normal, without renal calculi, focal lesion, or hydronephrosis. Bladder is unremarkable. Stomach/Bowel: Stomach is within normal limits. There is bowel  wall thickening and mucosal hyperenhancement of the ascending colon and proximal transverse colon. The appendix appears normal. No evidence of bowel obstruction. Lymphatic: No abnormally enlarged lymph nodes in the abdomen or pelvis. Reproductive: Status post hysterectomy. No adnexal masses. Other: No abdominal wall hernia or abnormality. No abdominopelvic ascites. Musculoskeletal: No acute or significant osseous findings. Review of the MIP images confirms the above findings. IMPRESSION: 1. Focal area of swelling and decreased enhancement involving the anterior/inferior pole of the left kidney with mild fat stranding and edema in the left perinephric space. Findings are concerning for focal pyelonephritis. 2. Bowel wall thickening and mucosal hyperenhancement of the ascending colon and proximal transverse colon is concerning for colitis. 3. No aortic aneurysm, intramural hematoma, or dissection. 4. Moderate bilateral ground-glass opacities with areas of nodular consolidation and trace bilateral pleural effusions, likely multifocal pneumonia. 5. Mild cardiomegaly and trace pericardial effusion. 6. Septated cystic structure at the upper pole the left kidney was described as Bosniak 65F on prior MRI of the abdomen from 12/23/2020 with recommendation for 1 year follow-up. Aortic Atherosclerosis (ICD10-I70.0). Electronically Signed   By: Romona Curls M.D.   On: 09/02/2022 13:38    Catarina Hartshorn, DO  [facility]  If 7PM-7AM, please contact night-coverage [email or web address] Password [facility] 09/03/2022, 11:34 AM   LOS: 1 day

## 2022-09-03 NOTE — Plan of Care (Addendum)
Patient alert but disoriented x 4.  Baseline confusion and bed alarm activated.  VSS throughout shift.  All meds given on time as ordered.  Pt became restless, PRN atarax given for relief.  Diminished lungs, IS encouraged. Purewick in place, urine output yellow and clear.  Warm blanket placed on pt's abd for relief of abd pain.  Fluids on-going.  Positive gram negative rods detected in blood cultures, provider notified.  POC maintained, will continue to monitor.  Problem: Education: Goal: Knowledge of General Education information will improve Description: Including pain rating scale, medication(s)/side effects and non-pharmacologic comfort measures Outcome: Progressing   Problem: Health Behavior/Discharge Planning: Goal: Ability to manage health-related needs will improve Outcome: Progressing   Problem: Clinical Measurements: Goal: Ability to maintain clinical measurements within normal limits will improve Outcome: Progressing Goal: Will remain free from infection Outcome: Progressing Goal: Diagnostic test results will improve Outcome: Progressing Goal: Respiratory complications will improve Outcome: Progressing Goal: Cardiovascular complication will be avoided Outcome: Progressing   Problem: Activity: Goal: Risk for activity intolerance will decrease Outcome: Progressing   Problem: Nutrition: Goal: Adequate nutrition will be maintained Outcome: Progressing   Problem: Coping: Goal: Level of anxiety will decrease Outcome: Progressing   Problem: Elimination: Goal: Will not experience complications related to bowel motility Outcome: Progressing Goal: Will not experience complications related to urinary retention Outcome: Progressing   Problem: Pain Managment: Goal: General experience of comfort will improve Outcome: Progressing   Problem: Safety: Goal: Ability to remain free from injury will improve Outcome: Progressing   Problem: Skin Integrity: Goal: Risk for  impaired skin integrity will decrease Outcome: Progressing

## 2022-09-03 NOTE — Progress Notes (Addendum)
Progress note  RN called due to patient's blood culture being positive for ESBL E. coli resistant to ceftriaxone.  Blood culture was reviewed.  Pharmacy was consulted for initiation of meropenem. Ceftriaxone was discontinued.

## 2022-09-03 NOTE — Progress Notes (Signed)
Pharmacy Antibiotic Note  ARINA TORRY is a 52 y.o. female admitted on 09/02/2022 with bacteremia.  Pharmacy has been consulted for blood culture positive for ESBL E.Coli dosing.  Plan: Meropenem 2g IV q 8 hrs. F/u cultures, clinical course.     Temp (24hrs), Avg:98.8 F (37.1 C), Min:97.5 F (36.4 C), Max:103.5 F (39.7 C)  Recent Labs  Lab 09/02/22 1114 09/02/22 1313 09/02/22 1457 09/02/22 1754  WBC 16.9*  --   --   --   CREATININE 0.92  --   --   --   LATICACIDVEN  --  1.1 5.0* 2.7*    Estimated Creatinine Clearance: 53.4 mL/min (by C-G formula based on SCr of 0.92 mg/dL).    Allergies  Allergen Reactions   Buprenorphine Other (See Comments)    Chest pain/ringing in ears and feet swelling   Penicillins Swelling and Rash        Aripiprazole Other (See Comments)    seizure   Seroquel [Quetiapine] Other (See Comments)    Seizure (08/18/20)   Cefaclor Other (See Comments)    Muscles locked up   Doxycycline Rash   Gabapentin Nausea And Vomiting    dizzy   Ibuprofen Swelling and Rash   Levofloxacin Rash    Itching   Lisinopril Nausea And Vomiting   Macrobid [Nitrofurantoin Monohyd Macro] Rash   Naproxen Swelling and Rash    Reece Leader, Colon Flattery, BCCP Clinical Pharmacist  09/03/2022 6:47 AM   [facility] pharmacy phone numbers are listed on [email or web address]

## 2022-09-03 NOTE — Consult Note (Signed)
Gastroenterology Consult   Referring Provider: Dr. Arbutus Leas  Primary Care Physician:  Smith Robert, MD (Inactive) Primary Gastroenterologist:  Dr. Marletta Lor  Patient ID: [name]; [id number]; 1970/09/17   Admit date: 09/02/2022  LOS: 1 day   Date of Consultation: 09/03/2022  Reason for Consultation:  Colitis   History of Present Illness   [name] is a 52 y.o. year old female with history of GERD, canddia esophagitis in Feb 2023, chronic nausea, constipation, prior episodes of colitis but colonoscopy unrevealing with normal colonic biopsies, presenting to the ED with worsening abdominal pain. She was admitted with sepsis in setting of bacteremia, pyelonephritis, pneumonia, concern for colitis on CT.    In the ED: Hgb 8.4, down from 13.2 in Jan 2024. Heme negative. WBC count 16.9. Potassium 3.0. Creatinine 0.92, albumin 2.0, AST 47, ALT 18, Alk Phos 187, Blood culture with ESBL E.coli. ferritin 31.  CTA on 4/21: bowel wall thickening, mucosal hyperenhancement of ascending and proximal transverse colon. Blood culture with ESBL E.coli  Last seen in the office April 2024: She had previously been seen in the emergency room 05/27/2022 with worsening abdominal pain, persistent vomiting throughout the day. CT A/P showed diffuse colonic wall thickening extending from distal transverse colon throughout the rectosigmoid junction consistent with inflammatory or infectious colitis. She was given a 5-day course of prednisone. She also had CT angiography 06/05/2022 that showed patent mesenteric vasculature. In April 2022, she had CT findings concerning for Crohn's colitis; however, colonoscopy was unrevealing.    States she had a liquid stool today; however, nursing states no diarrhea. She declined Linzess today. Abdomen is uncomfortable all over. Pain is constant. Pain present 2-3 days. No diarrhea per nursing staff. No rectal bleeding. Last BM was 2 days ago per patient. BM usually every 2-3 days.     Colonoscopy Feb 2023: February 2023 normal other than nonbleeding internal hemorrhoids and normal colon biopsies,   EGD February 2023: -Candida esophagitis -Gastritis -Treated with Diflucan    Past Medical History:  Diagnosis Date   Chronic abdominal pain    Chronic back pain    Colitis    Constipation    GERD (gastroesophageal reflux disease)    Hypertension    Migraine    Plumbism    blood clot    PONV (postoperative nausea and vomiting)    Renal mass    Uterine cancer    age 71    Past Surgical History:  Procedure Laterality Date   ABDOMINAL HYSTERECTOMY     BACK SURGERY     BIOPSY  07/07/2021   Procedure: BIOPSY;  Surgeon: Lanelle Bal, DO;  [facility]: [facility];  Service: Endoscopy;;   CHOLECYSTECTOMY     Complicated by bile leak requiring ERCP with temporary stenting   COLONOSCOPY WITH ESOPHAGOGASTRODUODENOSCOPY (EGD)  07/2009   [facility]: Op notes cannot be seen through care everywhere however pathology showed terminal ileum and random colon biopsies normal.  Stomach biopsy with gastric antral and fundic mucosa with reactive foveolar hyperplasia.  No active gastritis.  Stains negative for H. pylori.   COLONOSCOPY WITH PROPOFOL N/A 07/07/2021   Surgeon: Earnest Bailey K, DO;  Non-bleeding internal hemorrhoids, normal TI  biopsied, normal colon biopsied. Pathology with colon and TI biopsies normal.   ESOPHAGEAL BRUSHING  07/07/2021   Procedure: ESOPHAGEAL BRUSHING;  Surgeon: Lanelle Bal, DO;  Location: [facility];  Service: Endoscopy;;   ESOPHAGOGASTRODUODENOSCOPY  06/2007   [facility]: Normal esophagus, gastric mucosal  abnormality with erythema, biopsy showed gastric antral and fundic mucosa with reactive foveolar hyperplasia, no gastritis, no H. pylori.   ESOPHAGOGASTRODUODENOSCOPY (EGD) WITH PROPOFOL N/A 07/07/2021   Surgeon: Lanelle Bal, DO; moderately severe Candida esophagitis, gastritis, biopsies negative for H. pylori, normal examined  duodenum.  Prescribed Diflucan and recommended PPI twice daily.   HAND SURGERY      Prior to Admission medications   Medication Sig Start Date End Date Taking? Authorizing Provider  acetaminophen (TYLENOL) 500 MG tablet Take by mouth.   Yes [provider]  albuterol (VENTOLIN HFA) 108 (90 Base) MCG/ACT inhaler Inhale 2 puffs into the lungs every 4 (four) hours as needed for shortness of breath or wheezing. 04/30/22  Yes [provider]  famotidine (PEPCID) 20 MG tablet Take 20 mg by mouth daily. For break through heart burn   Yes [provider]  FLUoxetine (PROZAC) 40 MG capsule Take 40 mg by mouth daily.   Yes [provider]  hydrOXYzine (ATARAX/VISTARIL) 50 MG tablet Take 50 mg by mouth every 6 (six) hours as needed for anxiety. 09/29/20  Yes [provider]  losartan (COZAAR) 25 MG tablet Take 25 mg by mouth daily. 08/02/22  Yes [provider]  metoprolol succinate (TOPROL-XL) 100 MG 24 hr tablet Take 100 mg by mouth daily. Take with or immediately following a meal.   Yes [provider]  NICODERM CQ 21 MG/24HR patch Place 21 mg onto the skin daily. 08/02/22  Yes [provider]  ondansetron (ZOFRAN-ODT) 8 MG disintegrating tablet TAKE ONE TABLET BY MOUTH EVERY 8 HOURS 08/14/22  Yes Mahon, Courtney L, NP  pantoprazole (PROTONIX) 40 MG tablet TAKE (1) TABLET BY MOUTH TWICE A DAY BEFORE MEALS. (BREAKFAST AND SUPPER) 05/16/22  Yes Mahon, Courtney L, NP  promethazine (PHENERGAN) 25 MG tablet TAKE ONE-HALF TABLET BY MOUTH EVERY 6 HOURS AS NEEDED FOR NAUSEA AND VOMITING Patient taking differently: Take 12.5 mg by mouth every 6 (six) hours as needed for nausea or vomiting. 07/19/22  Yes Aida Raider, NP  SPIRIVA HANDIHALER 18 MCG inhalation capsule 1 capsule by inhaling the contents of the capsule using the HandiHaler device Inhalation Once a day for 30 days 08/02/22  Yes [provider]  temazepam (RESTORIL) 30 MG capsule  Take 30 mg by mouth at bedtime as needed for sleep.   Yes [provider]  VRAYLAR 3 MG capsule Take 3 mg by mouth daily. 07/03/22  Yes [provider]  busPIRone (BUSPAR) 5 MG tablet 1 tablet Orally Once a day Patient not taking: Reported on 09/02/2022 04/17/22   [provider]  dicyclomine (BENTYL) 20 MG tablet TAKE ONE TABLET BY MOUTH THREE TIMES DAILY WITH FOOD (hold for constipation) Patient not taking: Reported on 09/02/2022 08/27/22   Letta Median, PA-C  linaclotide [facility]) 72 MCG capsule Take 1 capsule (72 mcg total) by mouth daily before breakfast. Patient not taking: Reported on 09/02/2022 05/16/22   Aida Raider, NP  venlafaxine (EFFEXOR) 37.5 MG tablet 1 tablet with food Orally Once a day for 7 days Patient not taking: Reported on 09/02/2022 06/21/21   [provider]    Current Facility-Administered Medications  Medication Dose Route Frequency Provider Last Rate Last Admin   0.9 % NaCl with KCl 20 mEq/ L  infusion   Intravenous Continuous Tat, David, MD 75 mL/hr at 09/02/22 2300 Infusion Verify at 09/02/22 2300   azithromycin (ZITHROMAX) 500 mg in sodium chloride 0.9 % 250 mL IVPB  500 mg Intravenous Q24H Catarina Hartshorn, MD   Stopped at 09/02/22 1731   calcium gluconate 1 g/ 50 mL sodium chloride IVPB  1 g Intravenous Once Tat, Onalee Hua, MD       enoxaparin (LOVENOX) injection 40 mg  40 mg Subcutaneous Q24H Tat, Onalee Hua, MD   40 mg at 09/02/22 1753   FLUoxetine (PROZAC) capsule 40 mg  40 mg Oral Daily Tat, Onalee Hua, MD       hydrOXYzine (ATARAX) tablet 50 mg  50 mg Oral Q6H PRN Catarina Hartshorn, MD   50 mg at 09/02/22 2214   linaclotide (LINZESS) capsule 72 mcg  72 mcg Oral QAC breakfast Tat, Onalee Hua, MD       magnesium sulfate IVPB 2 g 50 mL  2 g Intravenous Once Tat, David, MD       meropenem (MERREM) 1 g in sodium chloride 0.9 % 100 mL IVPB  1 g Intravenous Q8H Earnie Larsson, RPH       metoprolol succinate (TOPROL-XL) 24 hr tablet 25 mg  25 mg Oral Daily  Tat, David, MD       metroNIDAZOLE (FLAGYL) IVPB 500 mg  500 mg Intravenous Pablo Ledger, MD 100 mL/hr at 09/03/22 0330 500 mg at 09/03/22 0330   morphine (PF) 2 MG/ML injection 2 mg  2 mg Intravenous Q3H PRN Tat, Onalee Hua, MD       nicotine (NICODERM CQ - dosed in mg/24 hours) patch 21 mg  21 mg Transdermal Daily Tat, David, MD       ondansetron [facility]) injection 4 mg  4 mg Intravenous Rob Bunting, MD   4 mg at 09/03/22 0556   pantoprazole (PROTONIX) injection 40 mg  40 mg Intravenous Pablo Ledger, MD   40 mg at 09/02/22 2214   potassium chloride SA (KLOR-CON M) CR tablet 20 mEq  20 mEq Oral Once Tat, Onalee Hua, MD       temazepam (RESTORIL) capsule 30 mg  30 mg Oral QHS PRN Catarina Hartshorn, MD        Allergies as of 09/02/2022 - Review Complete 09/02/2022  Allergen Reaction Noted   Buprenorphine Other (See Comments) 01/24/2016   Penicillins Swelling and Rash 07/08/2011   Aripiprazole Other (See Comments) 08/01/2020   Seroquel [quetiapine] Other (See Comments) 08/18/2020   Cefaclor Other (See Comments)    Doxycycline Rash 08/01/2020   Gabapentin Nausea And Vomiting 01/24/2016   Ibuprofen Swelling and Rash 07/08/2011   Levofloxacin Rash 01/24/2016   Lisinopril Nausea And Vomiting 08/01/2020   Macrobid [nitrofurantoin monohyd macro] Rash 11/30/2014   Naproxen Swelling and Rash 07/08/2011    Family History  Problem Relation Age of Onset   Colon cancer Maternal Grandfather        49s   Cancer Other    Seizures Other    Stroke Other    Diabetes Other    Colon cancer Maternal Uncle        67s    Social History   Socioeconomic History   Marital status: Married    Spouse name: benito   Number of children: 2   Years of education: Not on file   Highest education level: Associate degree: occupational, Scientist, product/process development, or vocational program  Occupational History    Comment: not employed  Tobacco Use   Smoking status: Every Day    Packs/day: 1.00    Years: 20.00    Additional pack  years: 0.00    Total pack years: 20.00    Types: Cigarettes,  E-cigarettes   Smokeless tobacco: Never  Vaping Use   Vaping Use: Never used  Substance and Sexual Activity   Alcohol use: No   Drug use: No   Sexual activity: Yes    Birth control/protection: Surgical  Other Topics Concern   Not on file  Social History Narrative   Not on file   Social Determinants of Health   Financial Resource Strain: Low Risk  (03/19/2017)   Overall Financial Resource Strain (CARDIA)    Difficulty of Paying Living Expenses: Not very hard  Food Insecurity: No Food Insecurity (03/19/2017)   Hunger Vital Sign    Worried About Running Out of Food in the Last Year: Never true    Ran Out of Food in the Last Year: Never true  Transportation Needs: No Transportation Needs (03/19/2017)   PRAPARE - Administrator, Civil Service (Medical): No    Lack of Transportation (Non-Medical): No  Physical Activity: Inactive (03/19/2017)   Exercise Vital Sign    Days of Exercise per Week: 0 days    Minutes of Exercise per Session: 0 min  Stress: Stress Concern Present (03/19/2017)   [employer] of Occupational Health - Occupational Stress Questionnaire    Feeling of Stress : To some extent  Social Connections: Somewhat Isolated (03/19/2017)   Social Connection and Isolation Panel [NHANES]    Frequency of Communication with Friends and Family: More than three times a week    Frequency of Social Gatherings with Friends and Family: Twice a week    Attends Religious Services: Never    Database administrator or Organizations: No    Attends Banker Meetings: Never    Marital Status: Married  Catering manager Violence: Not At Risk (03/19/2017)   Humiliation, Afraid, Rape, and Kick questionnaire    Fear of Current or Ex-Partner: No    Emotionally Abused: No    Physically Abused: No    Sexually Abused: No     Review of Systems   Gen: see HPI CV: Denies chest pain, heart palpitations,  syncope, edema  Resp: Denies shortness of breath with rest, cough, wheezing, coughing up blood, and pleurisy. GI: see HPI GU : Denies urinary burning, blood in urine, urinary frequency, and urinary incontinence. MS: Denies joint pain, limitation of movement, swelling, cramps, and atrophy.  Derm: Denies rash, itching, dry skin, hives. Psych: Denies depression, anxiety, memory loss, hallucinations, and confusion. Heme: Denies bruising or bleeding Neuro:  Denies any headaches, dizziness, paresthesias, shaking  Physical Exam   Vital Signs in last 24 hours: Temp:  [97.5 F (36.4 C)-103.5 F (39.7 C)] 97.5 F (36.4 C) (04/22 0402) Pulse Rate:  [75-123] 85 (04/22 0751) Resp:  [15-28] 22 (04/22 0751) BP: (85-167)/(63-115) 153/88 (04/22 0751) SpO2:  [90 %-100 %] 100 % (04/22 0751) Last BM Date :  (PTA)  General:   Alert,  chronically ill-appearing Head:  Normocephalic and atraumatic. Eyes:  Sclera clear, no icterus.    Ears:  Normal auditory acuity. Lungs:  without wheezing, diminished bases  Heart:  S1 S2 present without obvious murmurs Abdomen:  Soft, diffusely TTP, no guarding or rebound  Rectal: deferred   Msk:  Symmetrical without gross deformities. Normal posture. Extremities:  Without  edema. Neurologic:  Alert and  oriented x4. Skin:  Intact without significant lesions or rashes. Psych:  Alert and cooperative. Normal mood and affect.  Intake/Output from previous day: 04/21 0701 - 04/22 0700 In: 3751.4 [I.V.:398.4; IV Piggyback:3353] Out: 600 [Urine:600] Intake/Output  this shift: No intake/output data recorded.   Labs/Studies   Recent Labs Recent Labs    09/02/22 1114  WBC 16.9*  HGB 8.4*  HCT 28.6*  PLT 322   BMET Recent Labs    09/02/22 1114 09/03/22 0450  NA 136 139  K 3.0* 3.4*  CL 102 110  CO2 25 20*  GLUCOSE 124* 127*  BUN 12 11  CREATININE 0.92 0.80  CALCIUM 6.9* 6.1*   LFT Recent Labs    09/02/22 1114 09/03/22 0450  PROT 7.5 6.0*   ALBUMIN 2.0* 1.6*  AST 47* 83*  ALT 18 28  ALKPHOS 187* 155*  BILITOT 0.6 0.3     Radiology/Studies CT Angio Chest/Abd/Pel for Dissection W and/or W/WO  Result Date: 09/02/2022 CLINICAL DATA:  Severe abdominal pain with nausea and vomiting. Concern for acute aortic syndrome. EXAM: CT ANGIOGRAPHY CHEST, ABDOMEN AND PELVIS TECHNIQUE: Non-contrast CT of the chest was initially obtained. Multidetector CT imaging through the chest, abdomen and pelvis was performed using the standard protocol during bolus administration of intravenous contrast. Multiplanar reconstructed images and MIPs were obtained and reviewed to evaluate the vascular anatomy. RADIATION DOSE REDUCTION: This exam was performed according to the departmental dose-optimization program which includes automated exposure control, adjustment of the mA and/or kV according to patient size and/or use of iterative reconstruction technique. CONTRAST:  OMNIPAQUE IOHEXOL 350 MG/ML SOLN COMPARISON:  CT abdomen pelvis dated 06/05/2022 and chest radiograph dated 05/16/2018. FINDINGS: CTA CHEST FINDINGS Cardiovascular: Preferential opacification of the thoracic aorta. No evidence of thoracic aortic aneurysm, intramural hematoma, or dissection. Heart is mildly enlarged. Trace pericardial effusion. Mediastinum/Nodes: No enlarged mediastinal, hilar, or axillary lymph nodes. Thyroid gland, trachea, and esophagus demonstrate no significant findings. Lungs/Pleura: Moderate bilateral ground-glass opacities with areas of nodular consolidation are noted. Trace bilateral pleural effusions. No pneumothorax. Musculoskeletal: A chronic unfused distal right clavicle fracture versus degenerative changes is noted. No acute osseous injury. Review of the MIP images confirms the above findings. CTA ABDOMEN AND PELVIS FINDINGS VASCULAR Aorta: Atherosclerotic disease without aneurysm, dissection, vasculitis or significant stenosis. Celiac: Atherosclerotic disease without  evidence of aneurysm, dissection, vasculitis or significant stenosis. SMA: Patent without evidence of aneurysm, dissection, vasculitis or significant stenosis. Renals: Both renal arteries are patent without evidence of aneurysm, dissection, vasculitis, fibromuscular dysplasia or significant stenosis. IMA: Patent without evidence of aneurysm, dissection, vasculitis or significant stenosis. Inflow: Atherosclerotic disease without evidence of aneurysm, dissection, vasculitis or significant stenosis. Veins: No obvious venous abnormality within the limitations of this arterial phase study. Review of the MIP images confirms the above findings. NON-VASCULAR Hepatobiliary: No focal liver abnormality is seen. No gallstones, gallbladder wall thickening, or biliary dilatation. Pancreas: Unremarkable. No pancreatic ductal dilatation or surrounding inflammatory changes. Spleen: Normal in size without focal abnormality. Adrenals/Urinary Tract: Adrenal glands are unremarkable. There is a focal area of swelling and decreased enhancement involving the anterior/inferior pole of the left kidney (series 11, images 120 6-28). There is a mild fat stranding and edema in the left perinephric space. Septated cystic lesion in the upper left kidney is unchanged, measuring 3.9 x 2.9 cm. No renal calculi on the left. The right kidney appears normal, without renal calculi, focal lesion, or hydronephrosis. Bladder is unremarkable. Stomach/Bowel: Stomach is within normal limits. There is bowel wall thickening and mucosal hyperenhancement of the ascending colon and proximal transverse colon. The appendix appears normal. No evidence of bowel obstruction. Lymphatic: No abnormally enlarged lymph nodes in the abdomen or pelvis. Reproductive: Status post hysterectomy. No  adnexal masses. Other: No abdominal wall hernia or abnormality. No abdominopelvic ascites. Musculoskeletal: No acute or significant osseous findings. Review of the MIP images confirms  the above findings. IMPRESSION: 1. Focal area of swelling and decreased enhancement involving the anterior/inferior pole of the left kidney with mild fat stranding and edema in the left perinephric space. Findings are concerning for focal pyelonephritis. 2. Bowel wall thickening and mucosal hyperenhancement of the ascending colon and proximal transverse colon is concerning for colitis. 3. No aortic aneurysm, intramural hematoma, or dissection. 4. Moderate bilateral ground-glass opacities with areas of nodular consolidation and trace bilateral pleural effusions, likely multifocal pneumonia. 5. Mild cardiomegaly and trace pericardial effusion. 6. Septated cystic structure at the upper pole the left kidney was described as Bosniak 31F on prior MRI of the abdomen from 12/23/2020 with recommendation for 1 year follow-up. Aortic Atherosclerosis (ICD10-I70.0). Electronically Signed   By: Romona Curls M.D.   On: 09/02/2022 13:38     Assessment   [name] is a 52 y.o. year old female  with history of GERD, candida esophagitis in Feb 2023, chronic nausea, constipation, prior episodes of colitis but colonoscopy unrevealing with normal colonic biopsies in 2023, presenting to the ED with worsening abdominal pain. She was admitted with sepsis in setting of bacteremia, pyelonephritis, pneumonia, concern for colitis on CT.   Colitis: CTA on 4/21 with bowel wall thickening, mucosal hyperenhancement of ascending and proximal transverse colon. On IV antibiotics. No diarrhea. Continues with BM every few days, on Linzess. She declined Linzess today. Notably, she has had patent mesenteric vasculature previously. Prior episodes of colitis in Jan 2024 and April 2022, with 2022 CT concerning for Crohn's colitis. As noted, colonoscopy Feb 2023 normal with normal colonic biopsies. Recommend stool studies if diarrhea. Consider colonoscopy as outpatient.   Anemia: Hgb 8.4, down from 13.2 in Jan 2024. Ferritin 31. Heme negative.  No overt GI bleeding.     Plan / Recommendations    Recommend supportive measures with antibiotics Stool studies if diarrhea Advance diet as tolerated to soft Consider outpatient early interval colonoscopy once recovered from acute illness Linzess already ordered: hold if diarrhea. Patient declined Linzess this morning Continue PPI BID     09/03/2022, 8:33 AM  Gelene Mink, PhD, [facility]

## 2022-09-04 ENCOUNTER — Inpatient Hospital Stay (HOSPITAL_COMMUNITY): Payer: Medicaid Other

## 2022-09-04 DIAGNOSIS — K922 Gastrointestinal hemorrhage, unspecified: Secondary | ICD-10-CM

## 2022-09-04 DIAGNOSIS — R1033 Periumbilical pain: Secondary | ICD-10-CM

## 2022-09-04 DIAGNOSIS — R6520 Severe sepsis without septic shock: Secondary | ICD-10-CM

## 2022-09-04 DIAGNOSIS — K529 Noninfective gastroenteritis and colitis, unspecified: Secondary | ICD-10-CM | POA: Diagnosis not present

## 2022-09-04 DIAGNOSIS — A4151 Sepsis due to Escherichia coli [E. coli]: Secondary | ICD-10-CM | POA: Diagnosis not present

## 2022-09-04 DIAGNOSIS — J189 Pneumonia, unspecified organism: Secondary | ICD-10-CM | POA: Diagnosis not present

## 2022-09-04 DIAGNOSIS — N12 Tubulo-interstitial nephritis, not specified as acute or chronic: Secondary | ICD-10-CM | POA: Diagnosis not present

## 2022-09-04 LAB — CBC
HCT: 22.5 % — ABNORMAL LOW (ref 36.0–46.0)
Hemoglobin: 6.6 g/dL — CL (ref 12.0–15.0)
MCH: 24.6 pg — ABNORMAL LOW (ref 26.0–34.0)
MCHC: 29.3 g/dL — ABNORMAL LOW (ref 30.0–36.0)
MCV: 84 fL (ref 80.0–100.0)
Platelets: 237 10*3/uL (ref 150–400)
RBC: 2.68 MIL/uL — ABNORMAL LOW (ref 3.87–5.11)
RDW: 20.8 % — ABNORMAL HIGH (ref 11.5–15.5)
WBC: 17.2 10*3/uL — ABNORMAL HIGH (ref 4.0–10.5)
nRBC: 0.1 % (ref 0.0–0.2)

## 2022-09-04 LAB — TYPE AND SCREEN
Antibody Screen: NEGATIVE
Unit division: 0

## 2022-09-04 LAB — BLOOD GAS, ARTERIAL
Acid-base deficit: 6 mmol/L — ABNORMAL HIGH (ref 0.0–2.0)
Bicarbonate: 16.4 mmol/L — ABNORMAL LOW (ref 20.0–28.0)
Drawn by: 27766
O2 Saturation: 94.1 %
Patient temperature: 37
pCO2 arterial: 23 mmHg — ABNORMAL LOW (ref 32–48)
pH, Arterial: 7.46 — ABNORMAL HIGH (ref 7.35–7.45)
pO2, Arterial: 67 mmHg — ABNORMAL LOW (ref 83–108)

## 2022-09-04 LAB — CULTURE, BLOOD (ROUTINE X 2): Special Requests: ADEQUATE

## 2022-09-04 LAB — ABO/RH: ABO/RH(D): A POS

## 2022-09-04 LAB — MAGNESIUM: Magnesium: 1.7 mg/dL (ref 1.7–2.4)

## 2022-09-04 LAB — COMPREHENSIVE METABOLIC PANEL
ALT: 34 U/L (ref 0–44)
AST: 73 U/L — ABNORMAL HIGH (ref 15–41)
Albumin: 1.7 g/dL — ABNORMAL LOW (ref 3.5–5.0)
Alkaline Phosphatase: 148 U/L — ABNORMAL HIGH (ref 38–126)
Anion gap: 9 (ref 5–15)
BUN: 10 mg/dL (ref 6–20)
CO2: 19 mmol/L — ABNORMAL LOW (ref 22–32)
Calcium: 6.5 mg/dL — ABNORMAL LOW (ref 8.9–10.3)
Chloride: 102 mmol/L (ref 98–111)
Creatinine, Ser: 0.78 mg/dL (ref 0.44–1.00)
GFR, Estimated: >60 mL/min (ref >60)
Glucose, Bld: 88 mg/dL (ref 70–99)
Potassium: 3.7 mmol/L (ref 3.5–5.1)
Sodium: 130 mmol/L — ABNORMAL LOW (ref 135–145)
Total Bilirubin: 0.5 mg/dL (ref 0.3–1.2)
Total Protein: 6.3 g/dL — ABNORMAL LOW (ref 6.5–8.1)

## 2022-09-04 LAB — HEMOGLOBIN AND HEMATOCRIT, BLOOD
HCT: 21.6 % — ABNORMAL LOW (ref 36.0–46.0)
Hemoglobin: 6.5 g/dL — CL (ref 12.0–15.0)

## 2022-09-04 LAB — BPAM RBC
Blood Product Expiration Date: 202405242359
Unit Type and Rh: 6200
Unit Type and Rh: 6200

## 2022-09-04 LAB — TROPONIN I (HIGH SENSITIVITY)
Troponin I (High Sensitivity): 48 ng/L — ABNORMAL HIGH (ref <18)
Troponin I (High Sensitivity): 71 ng/L — ABNORMAL HIGH (ref <18)

## 2022-09-04 LAB — BRAIN NATRIURETIC PEPTIDE: B Natriuretic Peptide: 2180 pg/mL — ABNORMAL HIGH (ref 0.0–100.0)

## 2022-09-04 LAB — PREPARE RBC (CROSSMATCH)

## 2022-09-04 MED ORDER — SODIUM CHLORIDE 0.9% IV SOLUTION: Freq: Once | INTRAVENOUS | Status: AC

## 2022-09-04 MED ORDER — IPRATROPIUM-ALBUTEROL 0.5-2.5 (3) MG/3ML IN SOLN
3.0000 mL | Freq: Three times a day (TID) | RESPIRATORY_TRACT | Status: DC
  Administered 2022-09-05 – 2022-09-09 (×12): 3 mL via RESPIRATORY_TRACT
  Filled 2022-09-04 (×13): qty 3

## 2022-09-04 MED ORDER — METOPROLOL SUCCINATE ER 50 MG PO TB24
50.0000 mg | ORAL_TABLET | Freq: Once | ORAL | Status: AC
  Administered 2022-09-04: 50 mg via ORAL
  Filled 2022-09-04: qty 1

## 2022-09-04 MED ORDER — MAGNESIUM SULFATE 2 GM/50ML IV SOLN
2.0000 g | Freq: Once | INTRAVENOUS | Status: AC
  Administered 2022-09-04: 2 g via INTRAVENOUS
  Filled 2022-09-04: qty 50

## 2022-09-04 MED ORDER — CALCIUM GLUCONATE-NACL 1-0.675 GM/50ML-% IV SOLN
1.0000 g | Freq: Once | INTRAVENOUS | Status: AC
  Administered 2022-09-04: 1000 mg via INTRAVENOUS
  Filled 2022-09-04: qty 50

## 2022-09-04 MED ORDER — ALBUTEROL SULFATE (2.5 MG/3ML) 0.083% IN NEBU
2.5000 mg | INHALATION_SOLUTION | RESPIRATORY_TRACT | Status: DC | PRN
  Administered 2022-09-06: 2.5 mg via RESPIRATORY_TRACT
  Filled 2022-09-04: qty 3

## 2022-09-04 MED ORDER — ACETAMINOPHEN 325 MG PO TABS
650.0000 mg | ORAL_TABLET | Freq: Four times a day (QID) | ORAL | Status: DC | PRN
  Administered 2022-09-04 – 2022-09-05 (×3): 650 mg via ORAL
  Filled 2022-09-04 (×3): qty 2

## 2022-09-04 MED ORDER — POTASSIUM CHLORIDE IN NACL 20-0.9 MEQ/L-% IV SOLN: INTRAVENOUS | Status: DC

## 2022-09-04 MED ORDER — METOPROLOL SUCCINATE ER 50 MG PO TB24
100.0000 mg | ORAL_TABLET | Freq: Every day | ORAL | Status: DC
  Administered 2022-09-05 – 2022-09-07 (×3): 100 mg via ORAL
  Filled 2022-09-04 (×3): qty 2

## 2022-09-04 MED ORDER — FUROSEMIDE 10 MG/ML IJ SOLN
40.0000 mg | Freq: Once | INTRAMUSCULAR | Status: AC
  Administered 2022-09-04: 40 mg via INTRAVENOUS
  Filled 2022-09-04: qty 4

## 2022-09-04 MED ORDER — IPRATROPIUM-ALBUTEROL 0.5-2.5 (3) MG/3ML IN SOLN
3.0000 mL | Freq: Four times a day (QID) | RESPIRATORY_TRACT | Status: DC
  Administered 2022-09-04: 3 mL via RESPIRATORY_TRACT
  Filled 2022-09-04: qty 3

## 2022-09-04 NOTE — Progress Notes (Signed)
Present time at bedside with patient starting new IV. Patient states " I feel better".still c/o abdominal pain.Alert oriented to person ,place,and  situation today, Plan of care on going.

## 2022-09-04 NOTE — Progress Notes (Signed)
Present time at bedside with patient,c/o shortness of breath, oxygen saturation was 91% on 2 liters, B/P 167/113,Dr Tat notified. New orders received. Plan of care on going.

## 2022-09-04 NOTE — Progress Notes (Signed)
   09/04/22 1646  Vitals  Temp 98 F (36.7 C)  Temp Source Oral  BP (!) 172/102  MAP (mmHg) 123  BP Location Left Arm  BP Method Automatic  Patient Position (if appropriate) Lying  Pulse Rate (!) 107  Pulse Rate Source Dinamap  Resp (!) 21  Level of Consciousness  Level of Consciousness Alert  MEWS COLOR  MEWS Score Color Yellow  Oxygen Therapy  SpO2 94 %  O2 Device Nasal Cannula  O2 Flow Rate (L/min) 2 L/min  MEWS Score  MEWS Temp 0  MEWS Systolic 0  MEWS Pulse 1  MEWS RR 1  MEWS LOC 0  MEWS Score 2  Provider Notification  Provider Name/Title Dr Tat  Date Provider Notified 09/04/22  Time Provider Notified 1647  Method of Notification Page  Notification Reason Other (Comment) (HR 107, b/p 172/102)  Date Critical Result Received 09/04/22  Time Critical Result Received 1646

## 2022-09-04 NOTE — Progress Notes (Signed)
   09/04/22 0726  Provider Notification  Provider Name/Title Dr Tat  Date Provider Notified 09/04/22  Time Provider Notified [phone]  Method of Notification Page  Notification Reason Other (Comment) (H/H 6.5/21.6)  Test performed and critical result Lab  H/H 6.5/21.6  Date Critical Result Received 09/04/22  Time Critical Result Received 0725  Provider response See new orders  Date of Provider Response 09/04/22  Time of Provider Response [phone]

## 2022-09-04 NOTE — Progress Notes (Addendum)
Responded to nursing call:  dyspnea   Subjective: Pt complains of sob.  Some chest pressure.  Same abd pain  Vitals:   09/04/22 1659 09/04/22 1702 09/04/22 1703 09/04/22 1830  BP: (!) 158/98 (!) 142/100 (!) 142/100 (!) 167/113  Pulse: 98 97 97 (!) 102  Resp:  20 20 (!) 24  Temp:  98.2 F (36.8 C) 98.2 F (36.8 C)   TempSrc:  Oral    SpO2:  97%  93%   CV--RRR Lung--bilateral crackles.  No wheeze Abd--soft+BS/NT Ext--trac LE edema VS--HR 98-RR24-167/113--91% 2L  Assessment/Plan: Acute respiratory failure -STAT CXR -ABG -give duoneb -saline lock -give lasix IV x 1 -increase to 4L [place] -check troponins, BNP    Catarina Hartshorn, DO [facility]   ADDENDUM--personally reviewed CXR--bilateral interstitial infiltrates/pulmonary edema--lasix already ordered

## 2022-09-04 NOTE — Progress Notes (Signed)
Subjective: Feeling somewhat better today. Knows where she is and what is going on. Primary problem is abdominal pain but states it is a little better. Currently 8/10 in severity, primarily upper abdomen. Associated nausea but no vomiting. Nausea is well controlled on medications. She reports diarrhea started 4-5 days ago and she stopped Linzess. Had been having formed stools. Reports 3 Bms yesterday. 2 so far today, loose. No brbpr or melena.   Reports BC powders 1-2 daily for HAs.  Compliant with PPI BID at home.   Objective: Vital signs in last 24 hours: Temp:  [97.9 F (36.6 C)-100.6 F (38.1 C)] 98.2 F (36.8 C) (04/23 0903) Pulse Rate:  [81-97] 92 (04/23 0903) Resp:  [18-20] 18 (04/23 0903) BP: (138-160)/(68-88) 147/81 (04/23 0903) SpO2:  [92 %-100 %] 94 % (04/23 0903) Last BM Date : 09/03/22 (pt stated she has had diarrhea) General:   Alert and oriented, pleasant, pale, ill appearing.  Head:  Normocephalic and atraumatic. Eyes:  No icterus, sclera clear. Conjuctiva pink.  Abdomen:  Bowel sounds present, full but soft, generalized TTP, most severe in epigastric and LUQ region. No rebound or guarding.  Msk:  Symmetrical without gross deformities. Normal posture. Extremities:  With trace bilateral LE edema. Neurologic:  Alert and  oriented x4;  grossly normal neurologically. Psych:  Normal mood and affect.  Intake/Output from previous day: 04/22 0701 - 04/23 0700 In: 1446.3 [P.O.:480; I.V.:816.3; IV Piggyback:150] Out: -  Intake/Output this shift: No intake/output data recorded.  Lab Results: Recent Labs    09/02/22 1114 09/04/22 0403 09/04/22 0656  WBC 16.9* 17.2*  --   HGB 8.4* 6.6* 6.5*  HCT 28.6* 22.5* 21.6*  PLT 322 237  --    BMET Recent Labs    09/02/22 1114 09/03/22 0450 09/04/22 0403  NA 136 139 130*  K 3.0* 3.4* 3.7  CL 102 110 102  CO2 25 20* 19*  GLUCOSE 124* 127* 88  BUN CREATININE 0.92 0.80 0.78  CALCIUM 6.9* 6.1* 6.5*    LFT Recent Labs    09/02/22 1114 09/03/22 0450 09/04/22 0403  PROT 7.5 6.0* 6.3*  ALBUMIN 2.0* 1.6* 1.7*  AST 47* 83* 73*  ALT 18 28 34  ALKPHOS 187* 155* 148*  BILITOT 0.6 0.3 0.5    Studies/Results: CT Angio Chest/Abd/Pel for Dissection W and/or W/WO  Result Date: 09/02/2022 CLINICAL DATA:  Severe abdominal pain with nausea and vomiting. Concern for acute aortic syndrome. EXAM: CT ANGIOGRAPHY CHEST, ABDOMEN AND PELVIS TECHNIQUE: Non-contrast CT of the chest was initially obtained. Multidetector CT imaging through the chest, abdomen and pelvis was performed using the standard protocol during bolus administration of intravenous contrast. Multiplanar reconstructed images and MIPs were obtained and reviewed to evaluate the vascular anatomy. RADIATION DOSE REDUCTION: This exam was performed according to the departmental dose-optimization program which includes automated exposure control, adjustment of the mA and/or kV according to patient size and/or use of iterative reconstruction technique. CONTRAST:  OMNIPAQUE IOHEXOL 350 MG/ML SOLN COMPARISON:  CT abdomen pelvis dated 06/05/2022 and chest radiograph dated 05/16/2018. FINDINGS: CTA CHEST FINDINGS Cardiovascular: Preferential opacification of the thoracic aorta. No evidence of thoracic aortic aneurysm, intramural hematoma, or dissection. Heart is mildly enlarged. Trace pericardial effusion. Mediastinum/Nodes: No enlarged mediastinal, hilar, or axillary lymph nodes. Thyroid gland, trachea, and esophagus demonstrate no significant findings. Lungs/Pleura: Moderate bilateral ground-glass opacities with areas of nodular consolidation are noted. Trace bilateral pleural effusions. No pneumothorax. Musculoskeletal: A chronic unfused distal  right clavicle fracture versus degenerative changes is noted. No acute osseous injury. Review of the MIP images confirms the above findings. CTA ABDOMEN AND PELVIS FINDINGS VASCULAR Aorta: Atherosclerotic disease  without aneurysm, dissection, vasculitis or significant stenosis. Celiac: Atherosclerotic disease without evidence of aneurysm, dissection, vasculitis or significant stenosis. SMA: Patent without evidence of aneurysm, dissection, vasculitis or significant stenosis. Renals: Both renal arteries are patent without evidence of aneurysm, dissection, vasculitis, fibromuscular dysplasia or significant stenosis. IMA: Patent without evidence of aneurysm, dissection, vasculitis or significant stenosis. Inflow: Atherosclerotic disease without evidence of aneurysm, dissection, vasculitis or significant stenosis. Veins: No obvious venous abnormality within the limitations of this arterial phase study. Review of the MIP images confirms the above findings. NON-VASCULAR Hepatobiliary: No focal liver abnormality is seen. No gallstones, gallbladder wall thickening, or biliary dilatation. Pancreas: Unremarkable. No pancreatic ductal dilatation or surrounding inflammatory changes. Spleen: Normal in size without focal abnormality. Adrenals/Urinary Tract: Adrenal glands are unremarkable. There is a focal area of swelling and decreased enhancement involving the anterior/inferior pole of the left kidney (series 11, images 120 6-28). There is a mild fat stranding and edema in the left perinephric space. Septated cystic lesion in the upper left kidney is unchanged, measuring 3.9 x 2.9 cm. No renal calculi on the left. The right kidney appears normal, without renal calculi, focal lesion, or hydronephrosis. Bladder is unremarkable. Stomach/Bowel: Stomach is within normal limits. There is bowel wall thickening and mucosal hyperenhancement of the ascending colon and proximal transverse colon. The appendix appears normal. No evidence of bowel obstruction. Lymphatic: No abnormally enlarged lymph nodes in the abdomen or pelvis. Reproductive: Status post hysterectomy. No adnexal masses. Other: No abdominal wall hernia or abnormality. No  abdominopelvic ascites. Musculoskeletal: No acute or significant osseous findings. Review of the MIP images confirms the above findings. IMPRESSION: 1. Focal area of swelling and decreased enhancement involving the anterior/inferior pole of the left kidney with mild fat stranding and edema in the left perinephric space. Findings are concerning for focal pyelonephritis. 2. Bowel wall thickening and mucosal hyperenhancement of the ascending colon and proximal transverse colon is concerning for colitis. 3. No aortic aneurysm, intramural hematoma, or dissection. 4. Moderate bilateral ground-glass opacities with areas of nodular consolidation and trace bilateral pleural effusions, likely multifocal pneumonia. 5. Mild cardiomegaly and trace pericardial effusion. 6. Septated cystic structure at the upper pole the left kidney was described as Bosniak 61F on prior MRI of the abdomen from 12/23/2020 with recommendation for 1 year follow-up. Aortic Atherosclerosis (ICD10-I70.0). Electronically Signed   By: Romona Curls M.D.   On: 09/02/2022 13:38    Assessment:  52 y.o. year old female  with history of GERD, candida esophagitis in Feb 2023, chronic nausea, constipation, prior episodes of colitis but colonoscopy unrevealing with normal colonic biopsies in 2023, presenting to the ED with worsening abdominal pain. She was admitted with sepsis in setting of bacteremia, pyelonephritis, pneumonia, concern for colitis on CT.   Colitis:  CTA on 4/21 with bowel wall thickening, mucosal hyperenhancement of ascending and proximal transverse colon. On IV antibiotics. Reports loose stools for the last few days and stopped Linzess at home. Loose stools continue. Prior episodes of colitis in Jan 2024 and April 2022, with 2022 CT concerning for Crohn's colitis. colonoscopy Feb 2023 normal with normal colonic biopsies though this was after a course of steroids. Etiology of recurrent colitis is unclear. I have ordered stool studies as  patient is having diarrhea at this time. Recommend outpatient colonoscopy, consider inpatient if  she does not improve clinically.  Anemia:  Hemoglobin 8.4 on admission, down from 13.2 in January 2024.  Hemoccult negative.  Iron low at 10, saturation ratio low at 4%, ferritin 31.  Hemoglobin declined further to 6.6 today.  2 units PRBCs have been ordered.  She denies overt GI bleeding.  Confirmed with nursing staff.  However, patient does admit to taking 1-2 BC powders daily for headaches.  Concerned about peptic ulcer disease.  Notably, she is reporting most of her abdominal pain is in the epigastric and LUQ region. Recommend EGD once medically stable. Also needs colonoscopy, likely outpatient, to follow-up on colitis.   Plan: Agree with transfusing 2 units PRBCs. Monitor H&H and for overt GI bleeding. Recommend EGD once medically stable. Stool studies including C. difficile and GI pathogen panel. Continue empiric antibiotics. Continue IV pantoprazole twice daily. Continue clear liquid diet for now. Continue to hold Linzess in the setting of diarrhea. Continue supportive measures. Strict NSAID avoidance. Outpatient colonoscopy.     LOS: 2 days    09/04/2022, 9:39 AM   Ermalinda Memos, PA-C [facility]

## 2022-09-04 NOTE — Progress Notes (Signed)
   09/04/22 1830  Vitals  BP (!) 167/113  MAP (mmHg) 130  BP Location Left Arm  BP Method Automatic  Patient Position (if appropriate) Lying  Pulse Rate (!) 102  Pulse Rate Source Dinamap  Resp (!) 24  MEWS COLOR  MEWS Score Color Yellow  Oxygen Therapy  SpO2 91 %  O2 Device Nasal Cannula  O2 Flow Rate (L/min) 2 L/min  MEWS Score  MEWS Temp 0  MEWS Systolic 0  MEWS Pulse 1  MEWS RR 1  MEWS LOC 0  MEWS Score 2  Provider Notification  Provider Name/Title Dr Tat  Date Provider Notified 09/04/22  Time Provider Notified 1833  Method of Notification Page  Notification Reason Other (Comment) (B/P 167/113,102,)  Date Critical Result Received 09/04/22  Time Critical Result Received 1830  Provider response See new orders  Date of Provider Response 09/04/22

## 2022-09-04 NOTE — Progress Notes (Signed)
MD notified of patient temperature being 100.4.

## 2022-09-04 NOTE — Progress Notes (Addendum)
PROGRESS NOTE  [name] UJW:[id number] DOB: 1971/05/06 DOA: 09/02/2022 PCP: Smith Robert, MD (Inactive)  Brief History:  52 year old female with a history of hypertension, chronic abdominal pain, IBS, colitis, constipation, chronic nausea presenting with worsening generalized abdominal pain Last 24 hours prior to admission.  The patient's spouse at the bedside supplements the history.  The patient is a difficult historian.  Notably, the patient's spouse states that the patient has developed chest congestion, coughing, and some dyspnea on exertion over the past week.  She continues to smoke 1 pack/day.  She has not been on any recent antibiotics.  She has had some generalized weakness.  She has had decreased oral intake.  However, in the last 24 hours prior to admission she developed numerous episodes of nausea and vomiting.  There is no hematemesis.  The patient has not had any diarrhea.  There is no hematochezia or melena.  Spouse also relates that the patient has had some symptoms of urinary urgency in the last day to 2 days.  The patient normally takes BC powders 2 times per week for pain.  There is no hematuria or hemoptysis.  There is no hematemesis. Because of her worsening condition, the patient was brought to emergency department for further evaluation and treatment.  The patient has an extensive GI history for her abdominal pain and IBS and colitis.  Notably, in April 2022 the patient was noted to have colonic wall thickening/colitis on CT.  She was  treated empirically for IBD/suspected Crohn's with prednisone and Entocort with follow-up colonoscopy in February 2023 (off all steroids for several months)   normal other than nonbleeding internal hemorrhoids and normal colon biopsies.  More recently, the patient was seen in the emergency department on 05/27/2022 with worsening abdominal pain, persistent vomiting throughout the day. CT A/P showed diffuse colonic wall thickening  extending from distal transverse colon throughout the rectosigmoid junction consistent with inflammatory or infectious colitis. She was given a 5-day course of prednisone. She also had CT angiography 06/05/2022 that showed patent mesenteric vasculature. She was prescribed a 5-day course of Cipro.  She was most recently in the GI clinic on 08/23/2022 for her abdominal pain, IBS, and possible colitis.  It was felt that her chronic abdominal pain was multifactorial including IBS, bowel hypersensitivity syndrome, and functional abdominal pain.  She was instructed to continue dicyclomine and Linzess. Regarding her chronic nausea and vomiting she has had extensive workup withMRI abdomen in 2022 without biliary abnormalities. Gastric emptying study normal in 2023 and EGD February 2023 consistent with Candida esophagitis and gastritis.  In the ED, the patient was afebrile hemodynamically stable with oxygen saturation 90% on room air.  WBC 16.9, hemoglobin 8.4, platelets 222,000.  Sodium 136, potassium 3.0, bicarbonate 25, serum creatinine 0.92.  AST 47, ALT 18, alk phosphatase 187, total bilirubin 0.6.  CTA abdomen was negative for any contrast extravasation.  Did show focal area of swelling and decreased enhancement in the left kidney with mild fat perinephric stranding.  There was bowel thickening and mucosal enhancement in the ascending and proximal transverse colon. CTA chest was negative for any aortic dissection.  There is moderate GGO bilateral with nodular consolidation. The patient was started on ceftriaxone and metronidazole.   Assessment/Plan:  Sepsis -presented with fever (103.5) and leukocytosis -due to bacteremia, pyelonephritis and PNA -lactic peak 5.0 -check PCT 18.18 -initially on  ceftriaxone, azithro, metronidazole -continue IVF -follow blood culture--ESBL E coli  -  urine culture no done -changed to merrem -still having fevers but trending down   E. coli bacteremia -Preliminary  multiplex PCR suggest ESBL -Discontinue ceftriaxone -Started meropenem -Discontinue metronidazole   Acute respiratory failure with hypoxia -Presented with tachypnea and hypoxia with saturation 90% on room air -Secondary to pneumonia in the setting of COPD -Stable on 2 L nasal cannula>>weaned to RA -Wean oxygen as tolerated for saturation greater 92%   Lobar pneumonia -Start ceftriaxone and doxycycline -Check PCT 18.18 -CTA chest showed bilateral GGO with areas of nodular consolidation -check COVID--neg   Pyelonephritis -Obtain UA--21-50 WBC -Urine culture was not done as ordered -CT abdomen pelvis showed focal area of swelling and hypoenhancement in the left kidney with mild fat stranding and perinephric stranding -Continue meropenem as discussed above   Colonic wall thickening/colitis -GI consult appreciated -Continue meropenem as discussed above -Patient has follow-up Dr. Marletta Lor -lipase 26 -check C diff--pt having loose stool with abd pain with leukocytosis -stool pathogen panel   Chronic abdominal pain -Felt to be multifactorial including IBS, bowel hypersensitivity, functional bowel syndrome -Judicious opioids -UDS--positive for opiates and benzodiazepines -Continue pantoprazole -controlled with morphine   Acute anemia -FOBT negative in the ED -Iron saturation 4%, ferritin 31 -Hgb down to 6.6 on 4/23--transfuse 2 units PRBC   Chronic nausea and vomiting -Patient has had extensive workup as discussed above in the history -Start around-the-clock antiemetic -Continue pantoprazole   Chronic constipation Continue Linzess   Anxiety -Continue home dose temazepam -PDMP reviewed -continue fluoxetine -Patient receives temazepam 30 mg, #30 last refill 08/06/2022   Hypokalemia -Repleted -Add potassium to IV fluids   Hypomagnesemia/hypocalcemia -09/03/2022 corrected calcium 8.0 -replete again on 4/23   Essential Hypertension -continue metoprolol succinate at  lower dose -hold losartan   Acute metabolic encephalopathy -Secondary to infectious process -09/03/2022--improving           Family Communication:   Spouse updated 4/22   Consultants:  GI   Code Status:  FULL    DVT Prophylaxis:  [place] Lovenox     Procedures: As Listed in Progress Note Above   Antibiotics: Meropenem 4/22>> Ceftriaxone 4/21>>4/22 Metronidazole 4/21>>4/22             Subjective: Pt states abd pain is controlled with morphine.  States nv are improving. Denies cp, sob, cough.  Has diarrhea without hematochezia or melena  Objective: Vitals:   09/04/22 0655 09/04/22 0903 09/04/22 1202 09/04/22 1231  BP:  (!) 147/81 (!) 158/88 (!) 157/88  Pulse:  92 94 88  Resp:  18 18 18   Temp: (!) 100.6 F (38.1 C) 98.2 F (36.8 C) 98.2 F (36.8 C) 99.4 F (37.4 C)  TempSrc: Oral Oral Oral Oral  SpO2:  94% 93% 93%    Intake/Output Summary (Last 24 hours) at 09/04/2022 1305 Last data filed at 09/04/2022 0900 Gross per 24 hour  Intake 1306.32 ml  Output --  Net 1306.32 ml   Weight change:  Exam:  General:  Pt is alert, follows commands appropriately, not in acute distress HEENT: No icterus, No thrush, No neck mass, [place]/AT Cardiovascular: RRR, S1/S2, no rubs, no gallops Respiratory: bibasilar rales, L>R Abdomen: Soft/+BS,mild diffuse tender, non distended, no guarding Extremities: No edema, No lymphangitis, No petechiae, No rashes, no synovitis   Data Reviewed: I have personally reviewed following labs and imaging studies Basic Metabolic Panel: Recent Labs  Lab 09/02/22 1114 09/03/22 0450 09/04/22 0403  NA 136 139 130*  K 3.0* 3.4* 3.7  CL 102 110 102  CO2 25 20* 19*  GLUCOSE 124* 127* 88  BUN CREATININE 0.92 0.80 0.78  CALCIUM 6.9* 6.1* 6.5*  MG  --  1.1* 1.7   Liver Function Tests: Recent Labs  Lab 09/02/22 1114 09/03/22 0450 09/04/22 0403  AST 47* 83* 73*  ALT 18 28 34  ALKPHOS 187* 155* 148*  BILITOT 0.6 0.3 0.5  PROT  7.5 6.0* 6.3*  ALBUMIN 2.0* 1.6* 1.7*   Recent Labs  Lab 09/02/22 1114  LIPASE 26   No results for input(s): "AMMONIA" in the last 168 hours. Coagulation Profile: No results for input(s): "INR", "PROTIME" in the last 168 hours. CBC: Recent Labs  Lab 09/02/22 1114 09/04/22 0403 09/04/22 0656  WBC 16.9* 17.2*  --   NEUTROABS 15.0*  --   --   HGB 8.4* 6.6* 6.5*  HCT 28.6* 22.5* 21.6*  MCV 83.9 84.0  --   PLT 322 237  --    Cardiac Enzymes: No results for input(s): "CKTOTAL", "CKMB", "CKMBINDEX", "TROPONINI" in the last 168 hours. BNP: Invalid input(s): "POCBNP" CBG: No results for input(s): "GLUCAP" in the last 168 hours. HbA1C: No results for input(s): "HGBA1C" in the last 72 hours. Urine analysis:    Component Value Date/Time   COLORURINE YELLOW 09/02/2022 1754   APPEARANCEUR HAZY (A) 09/02/2022 1754   LABSPEC 1.036 (H) 09/02/2022 1754   PHURINE 5.0 09/02/2022 1754   GLUCOSEU NEGATIVE 09/02/2022 1754   HGBUR SMALL (A) 09/02/2022 1754   BILIRUBINUR NEGATIVE 09/02/2022 1754   KETONESUR NEGATIVE 09/02/2022 1754   PROTEINUR 100 (A) 09/02/2022 1754   UROBILINOGEN 0.2 03/24/2013 1422   NITRITE POSITIVE (A) 09/02/2022 1754   LEUKOCYTESUR SMALL (A) 09/02/2022 1754   Sepsis Labs: (procalcitonin:4,lacticidven:4) ) Recent Results (from the past 240 hour(s))  Culture, blood (routine x 2)     Status: None (Preliminary result)   Collection Time: 09/02/22  2:57 PM   Specimen: Blood  Result Value Ref Range Status   Specimen Description BOTTLES DRAWN AEROBIC ONLY BLOOD RIGHT HAND  Final   Special Requests Blood Culture adequate volume  Final   Culture   Final    NO GROWTH 2 DAYS Performed at [facility], [address], [place], [place] [address]    Report Status PENDING  Incomplete  Culture, blood (routine x 2)     Status: Abnormal (Preliminary result)   Collection Time: 09/02/22  2:57 PM   Specimen: Left Antecubital; Blood  Result Value Ref Range Status    Specimen Description   Final    LEFT ANTECUBITAL BOTTLES DRAWN AEROBIC AND ANAEROBIC Performed at [facility], [address] [place], [place] [address]    Special Requests   Final    Blood Culture adequate volume Performed at [facility], [address] [place], [place] [address]    Culture  Setup Time   Final    GRAM NEGATIVE RODS Gram Stain Report Called to,Read Back By and Verified With: HAILE,A @ 0239 ON 09/03/22 BY JUW ANAEROBIC BOTTLE ONLY GS DONE @ [facility] CRITICAL RESULT CALLED TO, READ BACK BY AND VERIFIED WITH: A. HAILE RN 09/03/22 @ 0547 BY AB    Culture (A)  Final    ESCHERICHIA COLI SUSCEPTIBILITIES TO FOLLOW Performed at [facility], [address] [place], [place] [address]    Report Status PENDING  Incomplete  Blood Culture ID Panel (Reflexed)     Status: Abnormal   Collection Time: 09/02/22  2:57 PM  Result Value Ref  Range Status   Enterococcus faecalis NOT DETECTED NOT DETECTED Final   Enterococcus Faecium NOT DETECTED NOT DETECTED Final   Listeria monocytogenes NOT DETECTED NOT DETECTED Final   Staphylococcus species NOT DETECTED NOT DETECTED Final   Staphylococcus aureus (BCID) NOT DETECTED NOT DETECTED Final   Staphylococcus epidermidis NOT DETECTED NOT DETECTED Final   Staphylococcus lugdunensis NOT DETECTED NOT DETECTED Final   Streptococcus species NOT DETECTED NOT DETECTED Final   Streptococcus agalactiae NOT DETECTED NOT DETECTED Final   Streptococcus pneumoniae NOT DETECTED NOT DETECTED Final   Streptococcus pyogenes NOT DETECTED NOT DETECTED Final   A.calcoaceticus-baumannii NOT DETECTED NOT DETECTED Final   Bacteroides fragilis NOT DETECTED NOT DETECTED Final   Enterobacterales DETECTED (A) NOT DETECTED Final    Comment: Enterobacterales represent a large order of gram negative bacteria, not a single organism. CRITICAL RESULT CALLED TO, READ BACK BY AND VERIFIED WITH: A. HAILE RN 09/03/22 @ 0547 BY AB    Enterobacter cloacae complex NOT  DETECTED NOT DETECTED Final   Escherichia coli DETECTED (A) NOT DETECTED Final    Comment: CRITICAL RESULT CALLED TO, READ BACK BY AND VERIFIED WITH: A. HAILE RN 09/03/22 @ 0547 BY AB    Klebsiella aerogenes NOT DETECTED NOT DETECTED Final   Klebsiella oxytoca NOT DETECTED NOT DETECTED Final   Klebsiella pneumoniae NOT DETECTED NOT DETECTED Final   Proteus species NOT DETECTED NOT DETECTED Final   Salmonella species NOT DETECTED NOT DETECTED Final   Serratia marcescens NOT DETECTED NOT DETECTED Final   Haemophilus influenzae NOT DETECTED NOT DETECTED Final   Neisseria meningitidis NOT DETECTED NOT DETECTED Final   Pseudomonas aeruginosa NOT DETECTED NOT DETECTED Final   Stenotrophomonas maltophilia NOT DETECTED NOT DETECTED Final   Candida albicans NOT DETECTED NOT DETECTED Final   Candida auris NOT DETECTED NOT DETECTED Final   Candida glabrata NOT DETECTED NOT DETECTED Final   Candida krusei NOT DETECTED NOT DETECTED Final   Candida parapsilosis NOT DETECTED NOT DETECTED Final   Candida tropicalis NOT DETECTED NOT DETECTED Final   Cryptococcus neoformans/gattii NOT DETECTED NOT DETECTED Final   CTX-M ESBL DETECTED (A) NOT DETECTED Final    Comment: CRITICAL RESULT CALLED TO, READ BACK BY AND VERIFIED WITH: A. HAILE RN 09/03/22 @ 0547 BY AB (NOTE) Extended spectrum beta-lactamase detected. Recommend a carbapenem as initial therapy.      Carbapenem resistance IMP NOT DETECTED NOT DETECTED Final   Carbapenem resistance KPC NOT DETECTED NOT DETECTED Final   Carbapenem resistance NDM NOT DETECTED NOT DETECTED Final   Carbapenem resist OXA 48 LIKE NOT DETECTED NOT DETECTED Final   Carbapenem resistance VIM NOT DETECTED NOT DETECTED Final    Comment: Performed at [facility], [address], [place], [place] [address]  Respiratory (~20 pathogens) panel by PCR     Status: None   Collection Time: 09/02/22  3:32 PM   Specimen: Nasopharyngeal Swab; Respiratory  Result Value Ref  Range Status   Adenovirus NOT DETECTED NOT DETECTED Final   Coronavirus 229E NOT DETECTED NOT DETECTED Final    Comment: (NOTE) The Coronavirus on the Respiratory Panel, DOES NOT test for the novel  Coronavirus (2019 nCoV)    Coronavirus HKU1 NOT DETECTED NOT DETECTED Final   Coronavirus NL63 NOT DETECTED NOT DETECTED Final   Coronavirus OC43 NOT DETECTED NOT DETECTED Final   Metapneumovirus NOT DETECTED NOT DETECTED Final   Rhinovirus / Enterovirus NOT DETECTED NOT DETECTED Final   Influenza A NOT DETECTED NOT DETECTED Final  Influenza B NOT DETECTED NOT DETECTED Final   Parainfluenza Virus 1 NOT DETECTED NOT DETECTED Final   Parainfluenza Virus 2 NOT DETECTED NOT DETECTED Final   Parainfluenza Virus 3 NOT DETECTED NOT DETECTED Final   Parainfluenza Virus 4 NOT DETECTED NOT DETECTED Final   Respiratory Syncytial Virus NOT DETECTED NOT DETECTED Final   Bordetella pertussis NOT DETECTED NOT DETECTED Final   Bordetella Parapertussis NOT DETECTED NOT DETECTED Final   Chlamydophila pneumoniae NOT DETECTED NOT DETECTED Final   Mycoplasma pneumoniae NOT DETECTED NOT DETECTED Final    Comment: Performed at [facility], [address], [place], [place] [address]  SARS Coronavirus 2 by RT PCR (hospital order, performed in [facility] hospital lab) *cepheid single result test* Anterior Nasal Swab     Status: None   Collection Time: 09/02/22  3:32 PM   Specimen: Anterior Nasal Swab  Result Value Ref Range Status   SARS Coronavirus 2 by RT PCR NEGATIVE NEGATIVE Final    Comment: (NOTE) SARS-CoV-2 target nucleic acids are NOT DETECTED.  The SARS-CoV-2 RNA is generally detectable in upper and lower respiratory specimens during the acute phase of infection. The lowest concentration of SARS-CoV-2 viral copies this assay can detect is 250 copies / mL. A negative result does not preclude SARS-CoV-2 infection and should not be used as the sole basis for treatment or other patient management  decisions.  A negative result may occur with improper specimen collection / handling, submission of specimen other than nasopharyngeal swab, presence of viral mutation(s) within the areas targeted by this assay, and inadequate number of viral copies (<250 copies / mL). A negative result must be combined with clinical observations, patient history, and epidemiological information.  Fact Sheet for Patients:   RoadLapTop.co.za  Fact Sheet for Healthcare Providers: http://kim-miller.com/  This test is not yet approved or  cleared by the [place] FDA and has been authorized for detection and/or diagnosis of SARS-CoV-2 by FDA under an Emergency Use Authorization (EUA).  This EUA will remain in effect (meaning this test can be used) for the duration of the COVID-19 declaration under Section 564(b)(1) of the Act, 21 U.S.C. section 360bbb-3(b)(1), unless the authorization is terminated or revoked sooner.  Performed at [facility], [address], [place], [place] [address]   MRSA Next Gen by PCR, Nasal     Status: None   Collection Time: 09/02/22  6:27 PM   Specimen: Nasal Mucosa; Nasal Swab  Result Value Ref Range Status   MRSA by PCR Next Gen NOT DETECTED NOT DETECTED Final    Comment: (NOTE) The GeneXpert MRSA Assay (FDA approved for NASAL specimens only), is one component of a comprehensive MRSA colonization surveillance program. It is not intended to diagnose MRSA infection nor to guide or monitor treatment for MRSA infections. Test performance is not FDA approved in patients less than 73 years old. Performed at [facility], [address] [place], [place] [address]      Scheduled Meds:  enoxaparin (LOVENOX) injection  40 mg Subcutaneous Q24H   FLUoxetine  40 mg Oral Daily   linaclotide  72 mcg Oral QAC breakfast   metoprolol succinate  25 mg Oral Daily   ondansetron (ZOFRAN) IV  4 mg Intravenous Q6H   pantoprazole (PROTONIX)  IV  40 mg Intravenous Q12H   Continuous Infusions:  azithromycin 500 mg (09/03/22 1606)   magnesium sulfate bolus IVPB 2 g (09/04/22 1222)   meropenem (MERREM) IV 1 g (09/04/22 9147)  Procedures/Studies: CT Angio Chest/Abd/Pel for Dissection W and/or W/WO  Result Date: 09/02/2022 CLINICAL DATA:  Severe abdominal pain with nausea and vomiting. Concern for acute aortic syndrome. EXAM: CT ANGIOGRAPHY CHEST, ABDOMEN AND PELVIS TECHNIQUE: Non-contrast CT of the chest was initially obtained. Multidetector CT imaging through the chest, abdomen and pelvis was performed using the standard protocol during bolus administration of intravenous contrast. Multiplanar reconstructed images and MIPs were obtained and reviewed to evaluate the vascular anatomy. RADIATION DOSE REDUCTION: This exam was performed according to the departmental dose-optimization program which includes automated exposure control, adjustment of the mA and/or kV according to patient size and/or use of iterative reconstruction technique. CONTRAST:  OMNIPAQUE IOHEXOL 350 MG/ML SOLN COMPARISON:  CT abdomen pelvis dated 06/05/2022 and chest radiograph dated 05/16/2018. FINDINGS: CTA CHEST FINDINGS Cardiovascular: Preferential opacification of the thoracic aorta. No evidence of thoracic aortic aneurysm, intramural hematoma, or dissection. Heart is mildly enlarged. Trace pericardial effusion. Mediastinum/Nodes: No enlarged mediastinal, hilar, or axillary lymph nodes. Thyroid gland, trachea, and esophagus demonstrate no significant findings. Lungs/Pleura: Moderate bilateral ground-glass opacities with areas of nodular consolidation are noted. Trace bilateral pleural effusions. No pneumothorax. Musculoskeletal: A chronic unfused distal right clavicle fracture versus degenerative changes is noted. No acute osseous injury. Review of the MIP images confirms the above findings. CTA ABDOMEN AND PELVIS FINDINGS VASCULAR Aorta: Atherosclerotic disease  without aneurysm, dissection, vasculitis or significant stenosis. Celiac: Atherosclerotic disease without evidence of aneurysm, dissection, vasculitis or significant stenosis. SMA: Patent without evidence of aneurysm, dissection, vasculitis or significant stenosis. Renals: Both renal arteries are patent without evidence of aneurysm, dissection, vasculitis, fibromuscular dysplasia or significant stenosis. IMA: Patent without evidence of aneurysm, dissection, vasculitis or significant stenosis. Inflow: Atherosclerotic disease without evidence of aneurysm, dissection, vasculitis or significant stenosis. Veins: No obvious venous abnormality within the limitations of this arterial phase study. Review of the MIP images confirms the above findings. NON-VASCULAR Hepatobiliary: No focal liver abnormality is seen. No gallstones, gallbladder wall thickening, or biliary dilatation. Pancreas: Unremarkable. No pancreatic ductal dilatation or surrounding inflammatory changes. Spleen: Normal in size without focal abnormality. Adrenals/Urinary Tract: Adrenal glands are unremarkable. There is a focal area of swelling and decreased enhancement involving the anterior/inferior pole of the left kidney (series 11, images 120 6-28). There is a mild fat stranding and edema in the left perinephric space. Septated cystic lesion in the upper left kidney is unchanged, measuring 3.9 x 2.9 cm. No renal calculi on the left. The right kidney appears normal, without renal calculi, focal lesion, or hydronephrosis. Bladder is unremarkable. Stomach/Bowel: Stomach is within normal limits. There is bowel wall thickening and mucosal hyperenhancement of the ascending colon and proximal transverse colon. The appendix appears normal. No evidence of bowel obstruction. Lymphatic: No abnormally enlarged lymph nodes in the abdomen or pelvis. Reproductive: Status post hysterectomy. No adnexal masses. Other: No abdominal wall hernia or abnormality. No  abdominopelvic ascites. Musculoskeletal: No acute or significant osseous findings. Review of the MIP images confirms the above findings. IMPRESSION: 1. Focal area of swelling and decreased enhancement involving the anterior/inferior pole of the left kidney with mild fat stranding and edema in the left perinephric space. Findings are concerning for focal pyelonephritis. 2. Bowel wall thickening and mucosal hyperenhancement of the ascending colon and proximal transverse colon is concerning for colitis. 3. No aortic aneurysm, intramural hematoma, or dissection. 4. Moderate bilateral ground-glass opacities with areas of nodular consolidation and trace bilateral pleural effusions, likely multifocal pneumonia. 5. Mild cardiomegaly and trace pericardial effusion. 6. Septated  cystic structure at the upper pole the left kidney was described as Bosniak 67F on prior MRI of the abdomen from 12/23/2020 with recommendation for 1 year follow-up. Aortic Atherosclerosis (ICD10-I70.0). Electronically Signed   By: Romona Curls M.D.   On: 09/02/2022 13:38    Catarina Hartshorn, DO  [facility]  If 7PM-7AM, please contact night-coverage [email or web address] Password [email or web address] 09/04/2022, 1:05 PM   LOS: 2 days

## 2022-09-04 NOTE — Progress Notes (Signed)
Present time at bedside with patient, Hemoglobin 6.5,hematocrit  21.6, Dr Tat notified, new orders received.  Patient receiving first unit of PRBC's ,vital signs stable. Patient and family educated on blood transfusion, verbalized understanding,educational note given to patient.Plan of care on going.

## 2022-09-04 NOTE — Progress Notes (Signed)
Critical result   09/04/22 9604  Provider Notification  Provider Name/Title Dr.Adefeo  Date Provider Notified 09/04/22  Time Provider Notified 0608  Method of Notification Page  Notification Reason Critical Result (HBG 6.6)  Test performed and critical result lab  Date Critical Result Received 09/04/22  Time Critical Result Received 0605

## 2022-09-05 ENCOUNTER — Inpatient Hospital Stay (HOSPITAL_COMMUNITY): Payer: Medicaid Other

## 2022-09-05 ENCOUNTER — Other Ambulatory Visit (HOSPITAL_COMMUNITY): Payer: Self-pay

## 2022-09-05 DIAGNOSIS — I5031 Acute diastolic (congestive) heart failure: Secondary | ICD-10-CM | POA: Diagnosis not present

## 2022-09-05 DIAGNOSIS — D649 Anemia, unspecified: Secondary | ICD-10-CM | POA: Diagnosis not present

## 2022-09-05 DIAGNOSIS — J189 Pneumonia, unspecified organism: Secondary | ICD-10-CM

## 2022-09-05 DIAGNOSIS — K529 Noninfective gastroenteritis and colitis, unspecified: Secondary | ICD-10-CM | POA: Diagnosis not present

## 2022-09-05 DIAGNOSIS — G894 Chronic pain syndrome: Secondary | ICD-10-CM | POA: Diagnosis not present

## 2022-09-05 DIAGNOSIS — N12 Tubulo-interstitial nephritis, not specified as acute or chronic: Secondary | ICD-10-CM | POA: Diagnosis not present

## 2022-09-05 DIAGNOSIS — A4151 Sepsis due to Escherichia coli [E. coli]: Secondary | ICD-10-CM | POA: Diagnosis not present

## 2022-09-05 LAB — COMPREHENSIVE METABOLIC PANEL
ALT: 61 U/L — ABNORMAL HIGH (ref 0–44)
AST: 157 U/L — ABNORMAL HIGH (ref 15–41)
Albumin: 2 g/dL — ABNORMAL LOW (ref 3.5–5.0)
Alkaline Phosphatase: 185 U/L — ABNORMAL HIGH (ref 38–126)
Anion gap: 11 (ref 5–15)
BUN: 7 mg/dL (ref 6–20)
CO2: 22 mmol/L (ref 22–32)
Calcium: 7.4 mg/dL — ABNORMAL LOW (ref 8.9–10.3)
Chloride: 98 mmol/L (ref 98–111)
Creatinine, Ser: 0.81 mg/dL (ref 0.44–1.00)
GFR, Estimated: >60 mL/min (ref >60)
Glucose, Bld: 93 mg/dL (ref 70–99)
Potassium: 3.2 mmol/L — ABNORMAL LOW (ref 3.5–5.1)
Sodium: 131 mmol/L — ABNORMAL LOW (ref 135–145)
Total Bilirubin: 0.8 mg/dL (ref 0.3–1.2)
Total Protein: 7.4 g/dL (ref 6.5–8.1)

## 2022-09-05 LAB — BPAM RBC
Blood Product Expiration Date: 202405232359
ISSUE DATE / TIME: 202404231144
ISSUE DATE / TIME: 202404231623

## 2022-09-05 LAB — CBC
HCT: 37.9 % (ref 36.0–46.0)
Hemoglobin: 12.4 g/dL (ref 12.0–15.0)
MCH: 27 pg (ref 26.0–34.0)
MCHC: 32.7 g/dL (ref 30.0–36.0)
MCV: 82.6 fL (ref 80.0–100.0)
Platelets: 247 10*3/uL (ref 150–400)
RBC: 4.59 MIL/uL (ref 3.87–5.11)
RDW: 18.8 % — ABNORMAL HIGH (ref 11.5–15.5)
WBC: 17.8 10*3/uL — ABNORMAL HIGH (ref 4.0–10.5)
nRBC: 0.1 % (ref 0.0–0.2)

## 2022-09-05 LAB — CULTURE, BLOOD (ROUTINE X 2): Special Requests: ADEQUATE

## 2022-09-05 LAB — TYPE AND SCREEN
ABO/RH(D): A POS
Unit division: 0

## 2022-09-05 LAB — ECHOCARDIOGRAM COMPLETE
Area-P 1/2: 7.16 cm2
S' Lateral: 3.3 cm

## 2022-09-05 LAB — MAGNESIUM: Magnesium: 1.8 mg/dL (ref 1.7–2.4)

## 2022-09-05 MED ORDER — PERFLUTREN LIPID MICROSPHERE
1.0000 mL | INTRAVENOUS | Status: AC | PRN
  Administered 2022-09-05: 4 mL via INTRAVENOUS

## 2022-09-05 MED ORDER — HYDROCODONE-ACETAMINOPHEN 7.5-325 MG PO TABS
1.0000 | ORAL_TABLET | Freq: Four times a day (QID) | ORAL | Status: DC | PRN
  Administered 2022-09-06: 1 via ORAL
  Filled 2022-09-05: qty 1

## 2022-09-05 MED ORDER — POTASSIUM CHLORIDE CRYS ER 20 MEQ PO TBCR
40.0000 meq | EXTENDED_RELEASE_TABLET | Freq: Once | ORAL | Status: DC
  Filled 2022-09-05: qty 2

## 2022-09-05 MED ORDER — POTASSIUM CHLORIDE 10 MEQ/100ML IV SOLN
10.0000 meq | INTRAVENOUS | Status: AC
  Administered 2022-09-05 (×3): 10 meq via INTRAVENOUS
  Filled 2022-09-05 (×3): qty 100

## 2022-09-05 MED ORDER — FUROSEMIDE 10 MG/ML IJ SOLN
40.0000 mg | Freq: Every day | INTRAMUSCULAR | Status: DC
  Administered 2022-09-05 – 2022-09-06 (×2): 40 mg via INTRAVENOUS
  Filled 2022-09-05 (×2): qty 4

## 2022-09-05 NOTE — Progress Notes (Addendum)
Subjective: Patient very drowsy this morning, poor historian. Endorses continued abdominal pain, mid lower abdomen, and diarrhea (nursing staff reported no diarrhea), denies nausea, vomiting. Has not eaten breakfast this morning. Denies rectal bleeding or melena. Unable to provide much history due to somnolence.   Objective: Vital signs in last 24 hours: Temp:  [98 F (36.7 C)-99.7 F (37.6 C)] 99.3 F (37.4 C) (04/24 0409) Pulse Rate:  [88-107] 91 (04/24 0409) Resp:  [18-24] 22 (04/24 0409) BP: (142-189)/(81-118) 162/101 (04/24 0409) SpO2:  [89 %-100 %] 96 % (04/24 0409) Last BM Date : 09/04/22 General:    pleasant, somnolent Head:  Normocephalic and atraumatic. Eyes:  No icterus, sclera clear. Conjuctiva pink.  Mouth:  Without lesions, mucosa pink and moist.  Heart:  S1, S2 present, no murmurs noted.  Lungs: Clear to auscultation bilaterally, without wheezing, rales, or rhonchi.  Abdomen:  Bowel sounds present, soft, generalized TTP, non-distended. No HSM or hernias noted. No rebound or guarding. No masses appreciated  Msk:  Symmetrical without gross deformities. Normal posture. Pulses:  Normal pulses noted. Extremities:  Without clubbing or edema. Neurologic:  somnolent  Skin:  Warm and dry, intact without significant lesions.  Psych:  Alert and cooperative. Normal mood and affect.  Intake/Output from previous day: 04/23 0701 - 04/24 0700 In: 2156.1 [P.O.:300; I.V.:133.1; Blood:823; IV Piggyback:900] Out: 2500 [Urine:2500] Intake/Output this shift: No intake/output data recorded.  Lab Results: Recent Labs    09/02/22 1114 09/04/22 0403 09/04/22 0656 09/05/22 0358  WBC 16.9* 17.2*  --  17.8*  HGB 8.4* 6.6* 6.5* 12.4  HCT 28.6* 22.5* 21.6* 37.9  PLT 322 237  --  247   BMET Recent Labs    09/03/22 0450 09/04/22 0403 09/05/22 0358  NA 139 130* 131*  K 3.4* 3.7 3.2*  CL 110 102 98  CO2 20* 19* 22  GLUCOSE 127* 88 93  BUN CREATININE 0.80 0.78 0.81   CALCIUM 6.1* 6.5* 7.4*   LFT Recent Labs    09/03/22 0450 09/04/22 0403 09/05/22 0358  PROT 6.0* 6.3* 7.4  ALBUMIN 1.6* 1.7* 2.0*  AST 83* 73* 157*  ALT 28 34 61*  ALKPHOS 155* 148* 185*  BILITOT 0.3 0.5 0.8    Studies/Results: DG CHEST PORT 1 VIEW  Result Date: 09/04/2022 CLINICAL DATA:  Respiratory distress EXAM: PORTABLE CHEST 1 VIEW COMPARISON:  Chest radiograph 05/16/2018.  CT chest 09/02/2022 FINDINGS: Heart size and pulmonary vascularity are normal for technique. Diffuse nodular infiltrative pattern throughout the lungs demonstrating progression since previous studies. This could represent atypical infection, pneumoconiosis, or less likely metastatic disease. No pleural effusion. No pneumothorax. Mediastinal contours appear intact. IMPRESSION: Diffuse nodular infiltrative pattern throughout the lungs demonstrating progression since previous studies. Electronically Signed   By: Burman Nieves M.D.   On: 09/04/2022 19:16    Assessment: [name] is a 52 y.o. female with history of GERD, candida esophagitis in Feb 2023, chronic nausea, constipation, prior episodes of colitis but colonoscopy unrevealing with normal colonic biopsies in 2023, presenting to the ED with worsening abdominal pain.  admitted with sepsis in setting of bacteremia, pyelonephritis, pneumonia, concern for colitis on CT.   Colitis:CTA on 4/21 with bowel wall thickening, mucosal hyperenhancement of ascending and proximal transverse colon. On IV antibiotics. loose stools for the last few days despite stopping her linzess. Prior episodes of colitis in Jan 2024 and April 2022, with 2022 CT concerning for Crohn's colitis. colonoscopy Feb 2023 normal with normal colonic biopsies  though this was after a course of steroids. Etiology of recurrent colitis is unclear. Patient reported ongoing diarrhea though nursing staff states no occurrence of diarrhea overnight or this morning. Somewhat of a poor historian due to  somnolence. Can obtain stool studies if diarrhea recurs.  Recommend outpatient colonoscopy, consider inpatient if she does not improve clinically.   Anemia: hgb 8.4 on admission, down from 13.2 in January 2-24, FOBT negative. Iron low at 10, sat 4%, ferritin 31. Hgb droppped to 6.6 yesterday, s/p 2 units PRBCs with surprising improvement of hgb to 12.4,  no overt GI bleeding reported by patient or staff. She takes 1-2 BC powders daily. 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 She has endorsed LUQ and epigastric pain, though today endorses generalized abdominal discomfort. will need EGD once medically stable.    Plan: Continue to trend h&h, transfuse as needed for hgb <7 Monitor for overt GI bleeding EGD once stable Empiric antibiotics to be continued Protonix  IV BID Clear liquid diet Cessation of all NSAIDs Outpatient colonoscopy unless not improving inpatient  Stool studies if diarrhea recurs  Continued supportive measures     LOS: 3 days    09/05/2022, 8:59 AM  Marcelle Bebout L. Jeanmarie Hubert, MSN, APRN, AGNP-C Adult-Gerontology Nurse Practitioner [facility]

## 2022-09-05 NOTE — Progress Notes (Signed)
   09/05/22 1100  ReDS Vest / Clip  BMI (Calculated) 24.71  Station Marker A  Ruler Value 36  ReDS Value Range < 36  ReDS Actual Value 23

## 2022-09-05 NOTE — Progress Notes (Addendum)
PROGRESS NOTE  [name]:[id number] DOB: August 25, 1970 DOA: 09/02/2022 PCP: Smith Robert, MD (Inactive)  Brief History:  52 year old female with a history of hypertension, chronic abdominal pain, IBS, colitis, constipation, chronic nausea presenting with worsening generalized abdominal pain Last 24 hours prior to admission.  The patient's spouse at the bedside supplements the history.  The patient is a difficult historian.  Notably, the patient's spouse states that the patient has developed chest congestion, coughing, and some dyspnea on exertion over the past week.  She continues to smoke 1 pack/day.  She has not been on any recent antibiotics.  She has had some generalized weakness.  She has had decreased oral intake.  However, in the last 24 hours prior to admission she developed numerous episodes of nausea and vomiting.  There is no hematemesis.  The patient has not had any diarrhea.  There is no hematochezia or melena.  Spouse also relates that the patient has had some symptoms of urinary urgency in the last day to 2 days.  The patient normally takes BC powders 2 times per week for pain.  There is no hematuria or hemoptysis.  There is no hematemesis. Because of her worsening condition, the patient was brought to emergency department for further evaluation and treatment.  The patient has an extensive GI history for her abdominal pain and IBS and colitis.  Notably, in April 2022 the patient was noted to have colonic wall thickening/colitis on CT.  She was  treated empirically for IBD/suspected Crohn's with prednisone and Entocort with follow-up colonoscopy in February 2023 (off all steroids for several months)   normal other than nonbleeding internal hemorrhoids and normal colon biopsies.  More recently, the patient was seen in the emergency department on 05/27/2022 with worsening abdominal pain, persistent vomiting throughout the day. CT A/P showed diffuse colonic wall thickening  extending from distal transverse colon throughout the rectosigmoid junction consistent with inflammatory or infectious colitis. She was given a 5-day course of prednisone. She also had CT angiography 06/05/2022 that showed patent mesenteric vasculature. She was prescribed a 5-day course of Cipro.  She was most recently in the GI clinic on 08/23/2022 for her abdominal pain, IBS, and possible colitis.  It was felt that her chronic abdominal pain was multifactorial including IBS, bowel hypersensitivity syndrome, and functional abdominal pain.  She was instructed to continue dicyclomine and Linzess. Regarding her chronic nausea and vomiting she has had extensive workup withMRI abdomen in 2022 without biliary abnormalities. Gastric emptying study normal in 2023 and EGD February 2023 consistent with Candida esophagitis and gastritis.  In the ED, the patient was afebrile hemodynamically stable with oxygen saturation 90% on room air.  WBC 16.9, hemoglobin 8.4, platelets 222,000.  Sodium 136, potassium 3.0, bicarbonate 25, serum creatinine 0.92.  AST 47, ALT 18, alk phosphatase 187, total bilirubin 0.6.  CTA abdomen was negative for any contrast extravasation.  Did show focal area of swelling and decreased enhancement in the left kidney with mild fat perinephric stranding.  There was bowel thickening and mucosal enhancement in the ascending and proximal transverse colon. CTA chest was negative for any aortic dissection.  There is moderate GGO bilateral with nodular consolidation. The patient was started on ceftriaxone and metronidazole.   Assessment/Plan:  Severe Sepsis -presented with fever (103.5) and leukocytosis -due to bacteremia, pyelonephritis and PNA -lactic peak 5.0 -check PCT 18.18 -initially on  ceftriaxone, azithro, metronidazole -continue IVF -follow blood culture--ESBL E coli  -  urine culture no done -changed to merrem -still having fevers but trending down   E. coli bacteremia -Preliminary  multiplex PCR suggest ESBL -Discontinue ceftriaxone -continue meropenem -Discontinue metronidazole   Acute respiratory failure with hypoxia -Presented with tachypnea and hypoxia with saturation 90% on room air -Secondary to pneumonia in the setting of COPD -Stable on 2 L nasal cannula>>weaned to RA intially -Wean oxygen as tolerated for saturation greater 92% -09/04/22 evening--pt had respiratory distress -09/04/22 CXR--personally reviewed--bilateral interstitial and nodular infiltrates -09/05/22--no up to 6L  Acute pulmonary edema -09/04/22 evening--respiratory distress -start IV lasix -BNP 2180 -Echo -accurate I/Os   Lobar pneumonia -Start ceftriaxone and doxycycline -Check PCT 18.18 -CTA chest showed bilateral GGO with areas of nodular consolidation;  no dissection -check COVID--neg   Pyelonephritis -Obtain UA--21-50 WBC -Urine culture was not done as ordered -CT abdomen pelvis showed focal area of swelling and hypoenhancement in the left kidney with mild fat stranding and perinephric stranding -Continue meropenem as discussed above   Colonic wall thickening/colitis -GI consult appreciated -Continue meropenem as discussed above -Patient has follow-up Dr. Marletta Lor -lipase 26 -check C diff--pt having loose stool with abd pain with leukocytosis -stool pathogen panel and cdiff ordered   Chronic abdominal pain -Felt to be multifactorial including IBS, bowel hypersensitivity, functional bowel syndrome -Judicious opioids -UDS--positive for opiates and benzodiazepines -Continue pantoprazole -controlled with morphine   Acute anemia -FOBT negative in the ED -Iron saturation 4%, ferritin 31 -Hgb down to 6.6 on 4/23--transfused 2 units PRBC   Chronic nausea and vomiting -Patient has had extensive workup as discussed above in the history -Continue around-the-clock antiemetic -Continue pantoprazole -emesis is improved, tolerating clears   Chronic constipation Continue  Linzess   Anxiety -Continue home dose temazepam -PDMP reviewed -continue fluoxetine -Patient receives temazepam 30 mg, #30 last refill 08/06/2022   Hypokalemia -Repleted -mag 1.8    Hypomagnesemia/hypocalcemia -09/03/2022 corrected calcium 8.0 -replete again on 4/23   Essential Hypertension -increase metoprolol succinate to home dose -hold losartan   Acute metabolic encephalopathy -Secondary to infectious process -09/03/2022--improving           Family Communication:   Spouse updated 4/24   Consultants:  GI   Code Status:  FULL    DVT Prophylaxis:  [place] Lovenox     Procedures: As Listed in Progress Note Above   Antibiotics: Meropenem 4/22>> Ceftriaxone 4/21>>4/22 Metronidazole 4/21>>4/22      Subjective: Pt states she is breathing better than yesterday.  Denies f/c, cp, n/v.  States abd pain still present, but a little better. No hematochezia.  Continues to have loose stool  Objective: Vitals:   09/04/22 1904 09/04/22 2005 09/05/22 0409 09/05/22 0929  BP: (!) 165/98 (!) 164/118 (!) 162/101 (!) 160/107  Pulse: 94 97 91 96  Resp:  (!) 22 (!) 22   Temp: 98 F (36.7 C) 98.2 F (36.8 C) 99.3 F (37.4 C) 98.4 F (36.9 C)  TempSrc: Oral Oral Oral Oral  SpO2: 96% 95% 96% 95%    Intake/Output Summary (Last 24 hours) at 09/05/2022 1153 Last data filed at 09/05/2022 0900 Gross per 24 hour  Intake 2056.11 ml  Output 2500 ml  Net -443.89 ml   Weight change:  Exam:  General:  Pt is alert, follows commands appropriately, not in acute distress HEENT: No icterus, No thrush, No neck mass, [place]/AT Cardiovascular: RRR, S1/S2, no rubs, no gallops Respiratory:bilateral crackles.  No wheeze Abdomen: Soft/+BS, mild diffuse tender, non distended, no guarding Extremities: trace LE edema, No lymphangitis, No  petechiae, No rashes, no synovitis   Data Reviewed: I have personally reviewed following labs and imaging studies Basic Metabolic Panel: Recent Labs  Lab  09/02/22 1114 09/03/22 0450 09/04/22 0403 09/05/22 0358  NA 136 139 130* 131*  K 3.0* 3.4* 3.7 3.2*  CL 102 110 102 98  CO2 25 20* 19* 22  GLUCOSE 124* 127* 88 93  BUN CREATININE 0.92 0.80 0.78 0.81  CALCIUM 6.9* 6.1* 6.5* 7.4*  MG  --  1.1* 1.7 1.8   Liver Function Tests: Recent Labs  Lab 09/02/22 1114 09/03/22 0450 09/04/22 0403 09/05/22 0358  AST 47* 83* 73* 157*  ALT 18 28 34 61*  ALKPHOS 187* 155* 148* 185*  BILITOT 0.6 0.3 0.5 0.8  PROT 7.5 6.0* 6.3* 7.4  ALBUMIN 2.0* 1.6* 1.7* 2.0*   Recent Labs  Lab 09/02/22 1114  LIPASE 26   No results for input(s): "AMMONIA" in the last 168 hours. Coagulation Profile: No results for input(s): "INR", "PROTIME" in the last 168 hours. CBC: Recent Labs  Lab 09/02/22 1114 09/04/22 0403 09/04/22 0656 09/05/22 0358  WBC 16.9* 17.2*  --  17.8*  NEUTROABS 15.0*  --   --   --   HGB 8.4* 6.6* 6.5* 12.4  HCT 28.6* 22.5* 21.6* 37.9  MCV 83.9 84.0  --  82.6  PLT 322 237  --  247   Cardiac Enzymes: No results for input(s): "CKTOTAL", "CKMB", "CKMBINDEX", "TROPONINI" in the last 168 hours. BNP: Invalid input(s): "POCBNP" CBG: No results for input(s): "GLUCAP" in the last 168 hours. HbA1C: No results for input(s): "HGBA1C" in the last 72 hours. Urine analysis:    Component Value Date/Time   COLORURINE YELLOW 09/02/2022 1754   APPEARANCEUR HAZY (A) 09/02/2022 1754   LABSPEC 1.036 (H) 09/02/2022 1754   PHURINE 5.0 09/02/2022 1754   GLUCOSEU NEGATIVE 09/02/2022 1754   HGBUR SMALL (A) 09/02/2022 1754   BILIRUBINUR NEGATIVE 09/02/2022 1754   KETONESUR NEGATIVE 09/02/2022 1754   PROTEINUR 100 (A) 09/02/2022 1754   UROBILINOGEN 0.2 03/24/2013 1422   NITRITE POSITIVE (A) 09/02/2022 1754   LEUKOCYTESUR SMALL (A) 09/02/2022 1754   Sepsis Labs: (procalcitonin:4,lacticidven:4) ) Recent Results (from the past 240 hour(s))  Culture, blood (routine x 2)     Status: None (Preliminary result)   Collection  Time: 09/02/22  2:57 PM   Specimen: Blood  Result Value Ref Range Status   Specimen Description BOTTLES DRAWN AEROBIC ONLY BLOOD RIGHT HAND  Final   Special Requests Blood Culture adequate volume  Final   Culture   Final    NO GROWTH 3 DAYS Performed at [facility], [address], [place], [place] [address]    Report Status PENDING  Incomplete  Culture, blood (routine x 2)     Status: Abnormal   Collection Time: 09/02/22  2:57 PM   Specimen: Left Antecubital; Blood  Result Value Ref Range Status   Specimen Description   Final    LEFT ANTECUBITAL BOTTLES DRAWN AEROBIC AND ANAEROBIC Performed at [facility], [address] [place], [place] [address]    Special Requests   Final    Blood Culture adequate volume Performed at [facility], [address] [place], [place] [address]    Culture  Setup Time   Final    GRAM NEGATIVE RODS Gram Stain Report Called to,Read Back By and Verified With: HAILE,A @ 0239 ON 09/03/22 BY JUW ANAEROBIC BOTTLE ONLY GS DONE @ [facility] CRITICAL RESULT CALLED TO,  READ BACK BY AND VERIFIED WITH: A. HAILE RN 09/03/22 @ 0547 BY AB Performed at [facility], [address] [place], [place] [address]    Culture (A)  Final    ESCHERICHIA COLI Confirmed Extended Spectrum Beta-Lactamase Producer (ESBL).  In bloodstream infections from ESBL organisms, carbapenems are preferred over piperacillin/tazobactam. They are shown to have a lower risk of mortality.    Report Status 09/05/2022 FINAL  Final   Organism ID, Bacteria ESCHERICHIA COLI  Final      Susceptibility   Escherichia coli - MIC*    AMPICILLIN >=32 RESISTANT Resistant     CEFEPIME 16 RESISTANT Resistant     CEFTAZIDIME RESISTANT Resistant     CEFTRIAXONE >=64 RESISTANT Resistant     CIPROFLOXACIN >=4 RESISTANT Resistant     GENTAMICIN >=16 RESISTANT Resistant     IMIPENEM <=0.25 SENSITIVE Sensitive     TRIMETH/SULFA >=320 RESISTANT Resistant     AMPICILLIN/SULBACTAM >=32 RESISTANT Resistant      PIP/TAZO <=4 SENSITIVE Sensitive     * ESCHERICHIA COLI  Blood Culture ID Panel (Reflexed)     Status: Abnormal   Collection Time: 09/02/22  2:57 PM  Result Value Ref Range Status   Enterococcus faecalis NOT DETECTED NOT DETECTED Final   Enterococcus Faecium NOT DETECTED NOT DETECTED Final   Listeria monocytogenes NOT DETECTED NOT DETECTED Final   Staphylococcus species NOT DETECTED NOT DETECTED Final   Staphylococcus aureus (BCID) NOT DETECTED NOT DETECTED Final   Staphylococcus epidermidis NOT DETECTED NOT DETECTED Final   Staphylococcus lugdunensis NOT DETECTED NOT DETECTED Final   Streptococcus species NOT DETECTED NOT DETECTED Final   Streptococcus agalactiae NOT DETECTED NOT DETECTED Final   Streptococcus pneumoniae NOT DETECTED NOT DETECTED Final   Streptococcus pyogenes NOT DETECTED NOT DETECTED Final   A.calcoaceticus-baumannii NOT DETECTED NOT DETECTED Final   Bacteroides fragilis NOT DETECTED NOT DETECTED Final   Enterobacterales DETECTED (A) NOT DETECTED Final    Comment: Enterobacterales represent a large order of gram negative bacteria, not a single organism. CRITICAL RESULT CALLED TO, READ BACK BY AND VERIFIED WITH: A. HAILE RN 09/03/22 @ 0547 BY AB    Enterobacter cloacae complex NOT DETECTED NOT DETECTED Final   Escherichia coli DETECTED (A) NOT DETECTED Final    Comment: CRITICAL RESULT CALLED TO, READ BACK BY AND VERIFIED WITH: A. HAILE RN 09/03/22 @ 0547 BY AB    Klebsiella aerogenes NOT DETECTED NOT DETECTED Final   Klebsiella oxytoca NOT DETECTED NOT DETECTED Final   Klebsiella pneumoniae NOT DETECTED NOT DETECTED Final   Proteus species NOT DETECTED NOT DETECTED Final   Salmonella species NOT DETECTED NOT DETECTED Final   Serratia marcescens NOT DETECTED NOT DETECTED Final   Haemophilus influenzae NOT DETECTED NOT DETECTED Final   Neisseria meningitidis NOT DETECTED NOT DETECTED Final   Pseudomonas aeruginosa NOT DETECTED NOT DETECTED Final   Stenotrophomonas  maltophilia NOT DETECTED NOT DETECTED Final   Candida albicans NOT DETECTED NOT DETECTED Final   Candida auris NOT DETECTED NOT DETECTED Final   Candida glabrata NOT DETECTED NOT DETECTED Final   Candida krusei NOT DETECTED NOT DETECTED Final   Candida parapsilosis NOT DETECTED NOT DETECTED Final   Candida tropicalis NOT DETECTED NOT DETECTED Final   Cryptococcus neoformans/gattii NOT DETECTED NOT DETECTED Final   CTX-M ESBL DETECTED (A) NOT DETECTED Final    Comment: CRITICAL RESULT CALLED TO, READ BACK BY AND VERIFIED WITH: A. HAILE RN 09/03/22 @ 0547 BY AB (NOTE) Extended spectrum  beta-lactamase detected. Recommend a carbapenem as initial therapy.      Carbapenem resistance IMP NOT DETECTED NOT DETECTED Final   Carbapenem resistance KPC NOT DETECTED NOT DETECTED Final   Carbapenem resistance NDM NOT DETECTED NOT DETECTED Final   Carbapenem resist OXA 48 LIKE NOT DETECTED NOT DETECTED Final   Carbapenem resistance VIM NOT DETECTED NOT DETECTED Final    Comment: Performed at [facility], [address], [place], [place] [address]  Respiratory (~20 pathogens) panel by PCR     Status: None   Collection Time: 09/02/22  3:32 PM   Specimen: Nasopharyngeal Swab; Respiratory  Result Value Ref Range Status   Adenovirus NOT DETECTED NOT DETECTED Final   Coronavirus 229E NOT DETECTED NOT DETECTED Final    Comment: (NOTE) The Coronavirus on the Respiratory Panel, DOES NOT test for the novel  Coronavirus (2019 nCoV)    Coronavirus HKU1 NOT DETECTED NOT DETECTED Final   Coronavirus NL63 NOT DETECTED NOT DETECTED Final   Coronavirus OC43 NOT DETECTED NOT DETECTED Final   Metapneumovirus NOT DETECTED NOT DETECTED Final   Rhinovirus / Enterovirus NOT DETECTED NOT DETECTED Final   Influenza A NOT DETECTED NOT DETECTED Final   Influenza B NOT DETECTED NOT DETECTED Final   Parainfluenza Virus 1 NOT DETECTED NOT DETECTED Final   Parainfluenza Virus 2 NOT DETECTED NOT DETECTED Final    Parainfluenza Virus 3 NOT DETECTED NOT DETECTED Final   Parainfluenza Virus 4 NOT DETECTED NOT DETECTED Final   Respiratory Syncytial Virus NOT DETECTED NOT DETECTED Final   Bordetella pertussis NOT DETECTED NOT DETECTED Final   Bordetella Parapertussis NOT DETECTED NOT DETECTED Final   Chlamydophila pneumoniae NOT DETECTED NOT DETECTED Final   Mycoplasma pneumoniae NOT DETECTED NOT DETECTED Final    Comment: Performed at [facility], [address], [place], [place] [address]  SARS Coronavirus 2 by RT PCR (hospital order, performed in [facility] hospital lab) *cepheid single result test* Anterior Nasal Swab     Status: None   Collection Time: 09/02/22  3:32 PM   Specimen: Anterior Nasal Swab  Result Value Ref Range Status   SARS Coronavirus 2 by RT PCR NEGATIVE NEGATIVE Final    Comment: (NOTE) SARS-CoV-2 target nucleic acids are NOT DETECTED.  The SARS-CoV-2 RNA is generally detectable in upper and lower respiratory specimens during the acute phase of infection. The lowest concentration of SARS-CoV-2 viral copies this assay can detect is 250 copies / mL. A negative result does not preclude SARS-CoV-2 infection and should not be used as the sole basis for treatment or other patient management decisions.  A negative result may occur with improper specimen collection / handling, submission of specimen other than nasopharyngeal swab, presence of viral mutation(s) within the areas targeted by this assay, and inadequate number of viral copies (<250 copies / mL). A negative result must be combined with clinical observations, patient history, and epidemiological information.  Fact Sheet for Patients:   RoadLapTop.co.za  Fact Sheet for Healthcare Providers: http://kim-miller.com/  This test is not yet approved or  cleared by the [place] FDA and has been authorized for detection and/or diagnosis of SARS-CoV-2 by FDA under an  Emergency Use Authorization (EUA).  This EUA will remain in effect (meaning this test can be used) for the duration of the COVID-19 declaration under Section 564(b)(1) of the Act, 21 U.S.C. section 360bbb-3(b)(1), unless the authorization is terminated or revoked sooner.  Performed at [facility], [address] [place],  [place] [address]   MRSA Next Gen by PCR, Nasal     Status: None   Collection Time: 09/02/22  6:27 PM   Specimen: Nasal Mucosa; Nasal Swab  Result Value Ref Range Status   MRSA by PCR Next Gen NOT DETECTED NOT DETECTED Final    Comment: (NOTE) The GeneXpert MRSA Assay (FDA approved for NASAL specimens only), is one component of a comprehensive MRSA colonization surveillance program. It is not intended to diagnose MRSA infection nor to guide or monitor treatment for MRSA infections. Test performance is not FDA approved in patients less than 70 years old. Performed at [facility], [address], [place], [place] [address]      Scheduled Meds:  enoxaparin (LOVENOX) injection  40 mg Subcutaneous Q24H   FLUoxetine  40 mg Oral Daily   furosemide  40 mg Intravenous Daily   ipratropium-albuterol  3 mL Nebulization TID   metoprolol succinate  100 mg Oral Daily   ondansetron (ZOFRAN) IV  4 mg Intravenous Q6H   pantoprazole (PROTONIX) IV  40 mg Intravenous Q12H   Continuous Infusions:  azithromycin 500 mg (09/04/22 1603)   meropenem (MERREM) IV 1 g (09/05/22 0516)    Procedures/Studies: DG CHEST PORT 1 VIEW  Result Date: 09/04/2022 CLINICAL DATA:  Respiratory distress EXAM: PORTABLE CHEST 1 VIEW COMPARISON:  Chest radiograph 05/16/2018.  CT chest 09/02/2022 FINDINGS: Heart size and pulmonary vascularity are normal for technique. Diffuse nodular infiltrative pattern throughout the lungs demonstrating progression since previous studies. This could represent atypical infection, pneumoconiosis, or less likely metastatic disease. No pleural effusion. No pneumothorax.  Mediastinal contours appear intact. IMPRESSION: Diffuse nodular infiltrative pattern throughout the lungs demonstrating progression since previous studies. Electronically Signed   By: Burman Nieves M.D.   On: 09/04/2022 19:16   CT Angio Chest/Abd/Pel for Dissection W and/or W/WO  Result Date: 09/02/2022 CLINICAL DATA:  Severe abdominal pain with nausea and vomiting. Concern for acute aortic syndrome. EXAM: CT ANGIOGRAPHY CHEST, ABDOMEN AND PELVIS TECHNIQUE: Non-contrast CT of the chest was initially obtained. Multidetector CT imaging through the chest, abdomen and pelvis was performed using the standard protocol during bolus administration of intravenous contrast. Multiplanar reconstructed images and MIPs were obtained and reviewed to evaluate the vascular anatomy. RADIATION DOSE REDUCTION: This exam was performed according to the departmental dose-optimization program which includes automated exposure control, adjustment of the mA and/or kV according to patient size and/or use of iterative reconstruction technique. CONTRAST:  OMNIPAQUE IOHEXOL 350 MG/ML SOLN COMPARISON:  CT abdomen pelvis dated 06/05/2022 and chest radiograph dated 05/16/2018. FINDINGS: CTA CHEST FINDINGS Cardiovascular: Preferential opacification of the thoracic aorta. No evidence of thoracic aortic aneurysm, intramural hematoma, or dissection. Heart is mildly enlarged. Trace pericardial effusion. Mediastinum/Nodes: No enlarged mediastinal, hilar, or axillary lymph nodes. Thyroid gland, trachea, and esophagus demonstrate no significant findings. Lungs/Pleura: Moderate bilateral ground-glass opacities with areas of nodular consolidation are noted. Trace bilateral pleural effusions. No pneumothorax. Musculoskeletal: A chronic unfused distal right clavicle fracture versus degenerative changes is noted. No acute osseous injury. Review of the MIP images confirms the above findings. CTA ABDOMEN AND PELVIS FINDINGS VASCULAR Aorta:  Atherosclerotic disease without aneurysm, dissection, vasculitis or significant stenosis. Celiac: Atherosclerotic disease without evidence of aneurysm, dissection, vasculitis or significant stenosis. SMA: Patent without evidence of aneurysm, dissection, vasculitis or significant stenosis. Renals: Both renal arteries are patent without evidence of aneurysm, dissection, vasculitis, fibromuscular dysplasia or significant stenosis. IMA: Patent without evidence of aneurysm, dissection, vasculitis or significant stenosis. Inflow: Atherosclerotic disease  without evidence of aneurysm, dissection, vasculitis or significant stenosis. Veins: No obvious venous abnormality within the limitations of this arterial phase study. Review of the MIP images confirms the above findings. NON-VASCULAR Hepatobiliary: No focal liver abnormality is seen. No gallstones, gallbladder wall thickening, or biliary dilatation. Pancreas: Unremarkable. No pancreatic ductal dilatation or surrounding inflammatory changes. Spleen: Normal in size without focal abnormality. Adrenals/Urinary Tract: Adrenal glands are unremarkable. There is a focal area of swelling and decreased enhancement involving the anterior/inferior pole of the left kidney (series 11, images 120 6-28). There is a mild fat stranding and edema in the left perinephric space. Septated cystic lesion in the upper left kidney is unchanged, measuring 3.9 x 2.9 cm. No renal calculi on the left. The right kidney appears normal, without renal calculi, focal lesion, or hydronephrosis. Bladder is unremarkable. Stomach/Bowel: Stomach is within normal limits. There is bowel wall thickening and mucosal hyperenhancement of the ascending colon and proximal transverse colon. The appendix appears normal. No evidence of bowel obstruction. Lymphatic: No abnormally enlarged lymph nodes in the abdomen or pelvis. Reproductive: Status post hysterectomy. No adnexal masses. Other: No abdominal wall hernia or  abnormality. No abdominopelvic ascites. Musculoskeletal: No acute or significant osseous findings. Review of the MIP images confirms the above findings. IMPRESSION: 1. Focal area of swelling and decreased enhancement involving the anterior/inferior pole of the left kidney with mild fat stranding and edema in the left perinephric space. Findings are concerning for focal pyelonephritis. 2. Bowel wall thickening and mucosal hyperenhancement of the ascending colon and proximal transverse colon is concerning for colitis. 3. No aortic aneurysm, intramural hematoma, or dissection. 4. Moderate bilateral ground-glass opacities with areas of nodular consolidation and trace bilateral pleural effusions, likely multifocal pneumonia. 5. Mild cardiomegaly and trace pericardial effusion. 6. Septated cystic structure at the upper pole the left kidney was described as Bosniak 75F on prior MRI of the abdomen from 12/23/2020 with recommendation for 1 year follow-up. Aortic Atherosclerosis (ICD10-I70.0). Electronically Signed   By: Romona Curls M.D.   On: 09/02/2022 13:38    Catarina Hartshorn, DO  [facility]  If 7PM-7AM, please contact night-coverage [email or web address] Password [facility] 09/05/2022, 11:53 AM   LOS: 3 days

## 2022-09-05 NOTE — TOC Benefit Eligibility Note (Signed)
Patient Product/process development scientist completed.    The patient is currently admitted and upon discharge could be taking Entresto 24-26 mg.  Requires Prior Authorization  The patient is currently admitted and upon discharge could be taking Farxiga 10 mg.  Requires Prior Authorization  The patient is currently admitted and upon discharge could be taking Jardiance 10 mg.  Requires Prior Authorization  The patient is insured through [employer] Medicare Part D   This test claim was processed through [facility]- copay amounts may vary at other pharmacies due to pharmacy/plan contracts, or as the patient moves through the different stages of their insurance plan.  Roland Earl, CPHT Pharmacy Patient Advocate Specialist [facility] Patient Advocate Team Direct Number: [phone]  Fax: [phone]

## 2022-09-05 NOTE — Progress Notes (Signed)
Patient c/o nausea , she is on scheduled zofran , gave tylenol for pain as she falls back asleep in between talking and do not feel morphine would be appropriate for her LOC at this time. Notified Dr. Arbutus Leas of patients c/o feeling nauseated. She took her PO medications without difficulty and she is easily arousable and oriented but falls back to sleep easily

## 2022-09-05 NOTE — Progress Notes (Signed)
Patient unable to take PO potassium , notified Dr. Arbutus Leas , she would not stay awake long enough to swallow the pills, IVPB potassium ordered.  Patient purewick cannister dumped and recorded for output

## 2022-09-06 DIAGNOSIS — I10 Essential (primary) hypertension: Secondary | ICD-10-CM | POA: Diagnosis not present

## 2022-09-06 DIAGNOSIS — K922 Gastrointestinal hemorrhage, unspecified: Secondary | ICD-10-CM

## 2022-09-06 DIAGNOSIS — R7881 Bacteremia: Secondary | ICD-10-CM | POA: Diagnosis not present

## 2022-09-06 DIAGNOSIS — E876 Hypokalemia: Secondary | ICD-10-CM

## 2022-09-06 DIAGNOSIS — A403 Sepsis due to Streptococcus pneumoniae: Secondary | ICD-10-CM

## 2022-09-06 DIAGNOSIS — N12 Tubulo-interstitial nephritis, not specified as acute or chronic: Secondary | ICD-10-CM | POA: Diagnosis not present

## 2022-09-06 LAB — COMPREHENSIVE METABOLIC PANEL
ALT: 46 U/L — ABNORMAL HIGH (ref 0–44)
AST: 86 U/L — ABNORMAL HIGH (ref 15–41)
Albumin: 1.6 g/dL — ABNORMAL LOW (ref 3.5–5.0)
Alkaline Phosphatase: 123 U/L (ref 38–126)
Anion gap: 10 (ref 5–15)
BUN: 9 mg/dL (ref 6–20)
CO2: 23 mmol/L (ref 22–32)
Calcium: 7.3 mg/dL — ABNORMAL LOW (ref 8.9–10.3)
Chloride: 103 mmol/L (ref 98–111)
Creatinine, Ser: 0.66 mg/dL (ref 0.44–1.00)
GFR, Estimated: >60 mL/min (ref >60)
Glucose, Bld: 83 mg/dL (ref 70–99)
Potassium: 3.2 mmol/L — ABNORMAL LOW (ref 3.5–5.1)
Sodium: 136 mmol/L (ref 135–145)
Total Bilirubin: 0.7 mg/dL (ref 0.3–1.2)
Total Protein: 5.9 g/dL — ABNORMAL LOW (ref 6.5–8.1)

## 2022-09-06 LAB — CBC
HCT: 40.1 % (ref 36.0–46.0)
Hemoglobin: 12.5 g/dL (ref 12.0–15.0)
MCH: 26.2 pg (ref 26.0–34.0)
MCHC: 31.2 g/dL (ref 30.0–36.0)
MCV: 84.1 fL (ref 80.0–100.0)
Platelets: 281 10*3/uL (ref 150–400)
RBC: 4.77 MIL/uL (ref 3.87–5.11)
RDW: 19.1 % — ABNORMAL HIGH (ref 11.5–15.5)
WBC: 17.2 10*3/uL — ABNORMAL HIGH (ref 4.0–10.5)
nRBC: 0 % (ref 0.0–0.2)

## 2022-09-06 LAB — PROCALCITONIN: Procalcitonin: 12.28 ng/mL

## 2022-09-06 LAB — BRAIN NATRIURETIC PEPTIDE: B Natriuretic Peptide: 1744 pg/mL — ABNORMAL HIGH (ref 0.0–100.0)

## 2022-09-06 LAB — C DIFFICILE QUICK SCREEN W PCR REFLEX
C Diff antigen: NEGATIVE
C Diff interpretation: NOT DETECTED
C Diff toxin: NEGATIVE

## 2022-09-06 MED ORDER — NITROGLYCERIN 0.4 MG SL SUBL: 0.4000 mg | SUBLINGUAL_TABLET | SUBLINGUAL | Status: DC | PRN

## 2022-09-06 MED ORDER — OXYCODONE HCL 5 MG PO TABS
5.0000 mg | ORAL_TABLET | ORAL | Status: DC | PRN
  Administered 2022-09-06 – 2022-09-09 (×12): 5 mg via ORAL
  Filled 2022-09-06 (×12): qty 1

## 2022-09-06 MED ORDER — ACETAMINOPHEN 325 MG PO TABS: 650.0000 mg | ORAL_TABLET | Freq: Four times a day (QID) | ORAL | Status: DC | PRN

## 2022-09-06 MED ORDER — POTASSIUM CHLORIDE CRYS ER 20 MEQ PO TBCR
40.0000 meq | EXTENDED_RELEASE_TABLET | Freq: Two times a day (BID) | ORAL | Status: DC
  Administered 2022-09-06 – 2022-09-08 (×6): 40 meq via ORAL
  Filled 2022-09-06 (×7): qty 2

## 2022-09-06 MED ORDER — DEXTROMETHORPHAN POLISTIREX ER 30 MG/5ML PO SUER
30.0000 mg | Freq: Two times a day (BID) | ORAL | Status: DC | PRN
  Administered 2022-09-06 – 2022-09-07 (×2): 30 mg via ORAL
  Filled 2022-09-06 (×2): qty 5

## 2022-09-06 MED ORDER — FUROSEMIDE 10 MG/ML IJ SOLN
40.0000 mg | Freq: Two times a day (BID) | INTRAMUSCULAR | Status: DC
  Administered 2022-09-06 – 2022-09-08 (×4): 40 mg via INTRAVENOUS
  Filled 2022-09-06 (×4): qty 4

## 2022-09-06 MED ORDER — FENTANYL CITRATE PF 50 MCG/ML IJ SOSY
12.5000 ug | PREFILLED_SYRINGE | INTRAMUSCULAR | Status: DC | PRN
  Administered 2022-09-06 – 2022-09-09 (×10): 12.5 ug via INTRAVENOUS
  Filled 2022-09-06 (×10): qty 1

## 2022-09-06 NOTE — Progress Notes (Addendum)
PROGRESS NOTE  [name] ZOX:[id number] DOB: 1970-08-24 DOA: 09/02/2022 PCP: Smith Robert, MD (Inactive)  Brief History:  52 year old female with a history of hypertension, chronic abdominal pain, IBS, colitis, constipation, chronic nausea presenting with worsening generalized abdominal pain Last 24 hours prior to admission.  The patient's spouse at the bedside supplements the history.  The patient is a difficult historian.  Notably, the patient's spouse states that the patient has developed chest congestion, coughing, and some dyspnea on exertion over the past week.  She continues to smoke 1 pack/day.  She has not been on any recent antibiotics.  She has had some generalized weakness.  She has had decreased oral intake.  However, in the last 24 hours prior to admission she developed numerous episodes of nausea and vomiting.  There is no hematemesis.  The patient has not had any diarrhea.  There is no hematochezia or melena.  Spouse also relates that the patient has had some symptoms of urinary urgency in the last day to 2 days.  The patient normally takes BC powders 2 times per week for pain.  There is no hematuria or hemoptysis.  There is no hematemesis. Because of her worsening condition, the patient was brought to emergency department for further evaluation and treatment.  The patient has an extensive GI history for her abdominal pain and IBS and colitis.  Notably, in April 2022 the patient was noted to have colonic wall thickening/colitis on CT.  She was  treated empirically for IBD/suspected Crohn's with prednisone and Entocort with follow-up colonoscopy in February 2023 (off all steroids for several months)   normal other than nonbleeding internal hemorrhoids and normal colon biopsies.  More recently, the patient was seen in the emergency department on 05/27/2022 with worsening abdominal pain, persistent vomiting throughout the day. CT A/P showed diffuse colonic wall thickening  extending from distal transverse colon throughout the rectosigmoid junction consistent with inflammatory or infectious colitis. She was given a 5-day course of prednisone. She also had CT angiography 06/05/2022 that showed patent mesenteric vasculature. She was prescribed a 5-day course of Cipro.  She was most recently in the GI clinic on 08/23/2022 for her abdominal pain, IBS, and possible colitis.  It was felt that her chronic abdominal pain was multifactorial including IBS, bowel hypersensitivity syndrome, and functional abdominal pain.  She was instructed to continue dicyclomine and Linzess. Regarding her chronic nausea and vomiting she has had extensive workup withMRI abdomen in 2022 without biliary abnormalities. Gastric emptying study normal in 2023 and EGD February 2023 consistent with Candida esophagitis and gastritis.  In the ED, the patient was afebrile hemodynamically stable with oxygen saturation 90% on room air.  WBC 16.9, hemoglobin 8.4, platelets 222,000.  Sodium 136, potassium 3.0, bicarbonate 25, serum creatinine 0.92.  AST 47, ALT 18, alk phosphatase 187, total bilirubin 0.6.  CTA abdomen was negative for any contrast extravasation.  Did show focal area of swelling and decreased enhancement in the left kidney with mild fat perinephric stranding.  There was bowel thickening and mucosal enhancement in the ascending and proximal transverse colon. CTA chest was negative for any aortic dissection.  There is moderate GGO bilateral with nodular consolidation. The patient was started on ceftriaxone and metronidazole.   Assessment/Plan:  Severe Sepsis -presented with fever (103.5) and leukocytosis -due to bacteremia, pyelonephritis and PNA -lactic peak 5.0 -check PCT 18.18 -initially on  ceftriaxone, azithro, metronidazole -continue IVF -follow blood culture--ESBL E coli  -  urine culture no done -changed to meropenem for a full 10 day course    E. coli bacteremia -Preliminary multiplex  PCR suggest ESBL -Discontinue ceftriaxone -continue meropenem -Discontinue metronidazole -ID consult requested and recommendation is for 10 day course of meropenem/ertapenem    Acute respiratory failure with hypoxia -Presented with tachypnea and hypoxia with saturation 90% on room air -Secondary to pneumonia in the setting of COPD -Stable on 2 L nasal cannula>>weaned to RA intially -Wean oxygen as tolerated for saturation greater 92% -09/04/22 evening--pt had respiratory distress -09/04/22 CXR--personally reviewed--bilateral interstitial and nodular infiltrates -09/06/22--weaned down to 3L  Acute pulmonary edema -09/04/22 evening--respiratory distress -start IV lasix -BNP 2180 -Echo -accurate I/Os  Intake/Output Summary (Last 24 hours) at 09/06/2022 1501 Last data filed at 09/06/2022 1420 Gross per 24 hour  Intake 1401.09 ml  Output 1900 ml  Net -498.91 ml   Filed Weights   09/05/22 1100  Weight: 59.3 kg    New Cardiomyopathy - Echo with worrisome findings of EF 25-30% with LV RWMAs - will request inpatient cardiology consultation for further recommendation   Lobar pneumonia -initially treated with ceftriaxone and doxycycline until switched to meropenem -Check PCT 18.18 -CTA chest showed bilateral GGO with areas of nodular consolidation;  no dissection -check COVID--neg   Pyelonephritis -Obtain UA--21-50 WBC -Urine culture was not done as ordered -CT abdomen pelvis showed focal area of swelling and hypoenhancement in the left kidney with mild fat stranding and perinephric stranding -Continue meropenem as discussed above for full 10 day course    Colonic wall thickening/colitis -GI consult appreciated -Continue meropenem as discussed above -Patient has follow-up Dr. Marletta Lor -lipase 26 -check C diff--pt having loose stool with abd pain with leukocytosis -stool pathogen panel and cdiff ordered   Chronic abdominal pain -Felt to be multifactorial including IBS, bowel  hypersensitivity, functional bowel syndrome -Judicious opioids -UDS--positive for opiates and benzodiazepines -Continue pantoprazole -controlled with prn opioid medication  -careful with opioids as she became oversedated on morphine -continue bowel regimen (linzess)    Acute anemia -FOBT negative in the ED -Iron saturation 4%, ferritin 31 -Hgb down to 6.6 on 4/23--transfused 2 units PRBC, Hg up to 12.5   Chronic nausea and vomiting -Patient has had extensive workup as discussed above in the history -Continue around-the-clock antiemetic -Continue pantoprazole -emesis is improved, tolerating clears   Chronic constipation Continue Linzess   Anxiety -Continue home dose temazepam -PDMP reviewed -continue fluoxetine -Patient receives temazepam 30 mg, #30 last refill 08/06/2022   Hypokalemia -Repleted -mag 1.8    Hypomagnesemia/hypocalcemia -09/03/2022 corrected calcium 8.0 -replete again on 4/23   Essential Hypertension -increase metoprolol succinate to home dose -hold losartan   Acute metabolic encephalopathy -Secondary to infectious process -09/03/2022--improving      Family Communication:   Spouse updated by Dr. Arbutus Leas 4/24   Consultants:  GI   Code Status:  FULL    DVT Prophylaxis:  [place] Lovenox     Procedures: As Listed in Progress Note Above   Antibiotics: Meropenem 4/22>> Ceftriaxone 4/21>>4/22 Metronidazole 4/21>>4/22  Subjective: Pt reports she wants to have the morphine IV restarted.  Yesterday it was discontinued because patient became obtunded on the medication, now she is alert and asking for it again, she reports that she normally is taking oxycodone thru the day for chronic pain symptoms although it is not in the home med rec.   Objective: Vitals:   09/06/22 0341 09/06/22 0436 09/06/22 0804 09/06/22 0826  BP: (!) 150/98  134/77   Pulse:  88  99 75  Resp: (!) 22   20  Temp: 98.2 F (36.8 C)     TempSrc:      SpO2: 93% 97%    Weight:         Intake/Output Summary (Last 24 hours) at 09/06/2022 1456 Last data filed at 09/06/2022 1420 Gross per 24 hour  Intake 1401.09 ml  Output 1900 ml  Net -498.91 ml   Weight change:  Exam:  General:  Pt appears chronically ill, awake,  follows commands appropriately, not in acute distress HEENT: No icterus, No thrush, No neck mass, [place]/AT Cardiovascular: normal S1/S2, no rubs, no gallops Respiratory:  no increased work of breathing.  Occasional dry cough; no wheezing heard.  Abdomen: Soft/+BS, mild diffuse tender, non distended, no guarding Extremities: trace LE edema, No lymphangitis, No petechiae, No rashes, no synovitis   Data Reviewed: I have personally reviewed following labs and imaging studies Basic Metabolic Panel: Recent Labs  Lab 09/02/22 1114 09/03/22 0450 09/04/22 0403 09/05/22 0358 09/06/22 0418  NA 136 139 130* 131* 136  K 3.0* 3.4* 3.7 3.2* 3.2*  CL 102 110 102 98 103  CO2 25 20* 19* 22 23  GLUCOSE 124* 127* 88 93 83  BUN 12 11 10 7 9   CREATININE 0.92 0.80 0.78 0.81 0.66  CALCIUM 6.9* 6.1* 6.5* 7.4* 7.3*  MG  --  1.1* 1.7 1.8  --    Liver Function Tests: Recent Labs  Lab 09/02/22 1114 09/03/22 0450 09/04/22 0403 09/05/22 0358 09/06/22 0418  AST 47* 83* 73* 157* 86*  ALT 18 28 34 61* 46*  ALKPHOS 187* 155* 148* 185* 123  BILITOT 0.6 0.3 0.5 0.8 0.7  PROT 7.5 6.0* 6.3* 7.4 5.9*  ALBUMIN 2.0* 1.6* 1.7* 2.0* 1.6*   Recent Labs  Lab 09/02/22 1114  LIPASE 26   No results for input(s): "AMMONIA" in the last 168 hours. Coagulation Profile: No results for input(s): "INR", "PROTIME" in the last 168 hours. CBC: Recent Labs  Lab 09/02/22 1114 09/04/22 0403 09/04/22 0656 09/05/22 0358 09/06/22 0418  WBC 16.9* 17.2*  --  17.8* 17.2*  NEUTROABS 15.0*  --   --   --   --   HGB 8.4* 6.6* 6.5* 12.4 12.5  HCT 28.6* 22.5* 21.6* 37.9 40.1  MCV 83.9 84.0  --  82.6 84.1  PLT 322 237  --  247 281   Cardiac Enzymes: No results for input(s): "CKTOTAL",  "CKMB", "CKMBINDEX", "TROPONINI" in the last 168 hours. BNP: Invalid input(s): "POCBNP" CBG: No results for input(s): "GLUCAP" in the last 168 hours. HbA1C: No results for input(s): "HGBA1C" in the last 72 hours. Urine analysis:    Component Value Date/Time   COLORURINE YELLOW 09/02/2022 1754   APPEARANCEUR HAZY (A) 09/02/2022 1754   LABSPEC 1.036 (H) 09/02/2022 1754   PHURINE 5.0 09/02/2022 1754   GLUCOSEU NEGATIVE 09/02/2022 1754   HGBUR SMALL (A) 09/02/2022 1754   BILIRUBINUR NEGATIVE 09/02/2022 1754   KETONESUR NEGATIVE 09/02/2022 1754   PROTEINUR 100 (A) 09/02/2022 1754   UROBILINOGEN 0.2 03/24/2013 1422   NITRITE POSITIVE (A) 09/02/2022 1754   LEUKOCYTESUR SMALL (A) 09/02/2022 1754   Sepsis Labs: @LABRCNTIP (procalcitonin:4,lacticidven:4) ) Recent Results (from the past 240 hour(s))  Culture, blood (routine x 2)     Status: None (Preliminary result)   Collection Time: 09/02/22  2:57 PM   Specimen: Blood  Result Value Ref Range Status   Specimen Description BOTTLES DRAWN AEROBIC ONLY BLOOD RIGHT HAND  Final  Special Requests Blood Culture adequate volume  Final   Culture   Final    NO GROWTH 4 DAYS Performed at [facility], [address], [place], [place] [address]    Report Status PENDING  Incomplete  Culture, blood (routine x 2)     Status: Abnormal   Collection Time: 09/02/22  2:57 PM   Specimen: Left Antecubital; Blood  Result Value Ref Range Status   Specimen Description   Final    LEFT ANTECUBITAL BOTTLES DRAWN AEROBIC AND ANAEROBIC Performed at [facility], [address], [place], [place] [address]    Special Requests   Final    Blood Culture adequate volume Performed at [facility], [address], [place], [place] [address]    Culture  Setup Time   Final    GRAM NEGATIVE RODS Gram Stain Report Called to,Read Back By and Verified With: HAILE,A @ 0239 ON 09/03/22 BY JUW ANAEROBIC BOTTLE ONLY GS DONE @ [facility] CRITICAL RESULT CALLED TO, READ BACK BY AND  VERIFIED WITH: A. HAILE RN 09/03/22 @ 0547 BY AB Performed at [facility], [address] [place], [place] [address]    Culture (A)  Final    ESCHERICHIA COLI Confirmed Extended Spectrum Beta-Lactamase Producer (ESBL).  In bloodstream infections from ESBL organisms, carbapenems are preferred over piperacillin/tazobactam. They are shown to have a lower risk of mortality.    Report Status 09/05/2022 FINAL  Final   Organism ID, Bacteria ESCHERICHIA COLI  Final      Susceptibility   Escherichia coli - MIC*    AMPICILLIN >=32 RESISTANT Resistant     CEFEPIME 16 RESISTANT Resistant     CEFTAZIDIME RESISTANT Resistant     CEFTRIAXONE >=64 RESISTANT Resistant     CIPROFLOXACIN >=4 RESISTANT Resistant     GENTAMICIN >=16 RESISTANT Resistant     IMIPENEM <=0.25 SENSITIVE Sensitive     TRIMETH/SULFA >=320 RESISTANT Resistant     AMPICILLIN/SULBACTAM >=32 RESISTANT Resistant     PIP/TAZO <=4 SENSITIVE Sensitive     * ESCHERICHIA COLI  Blood Culture ID Panel (Reflexed)     Status: Abnormal   Collection Time: 09/02/22  2:57 PM  Result Value Ref Range Status   Enterococcus faecalis NOT DETECTED NOT DETECTED Final   Enterococcus Faecium NOT DETECTED NOT DETECTED Final   Listeria monocytogenes NOT DETECTED NOT DETECTED Final   Staphylococcus species NOT DETECTED NOT DETECTED Final   Staphylococcus aureus (BCID) NOT DETECTED NOT DETECTED Final   Staphylococcus epidermidis NOT DETECTED NOT DETECTED Final   Staphylococcus lugdunensis NOT DETECTED NOT DETECTED Final   Streptococcus species NOT DETECTED NOT DETECTED Final   Streptococcus agalactiae NOT DETECTED NOT DETECTED Final   Streptococcus pneumoniae NOT DETECTED NOT DETECTED Final   Streptococcus pyogenes NOT DETECTED NOT DETECTED Final   A.calcoaceticus-baumannii NOT DETECTED NOT DETECTED Final   Bacteroides fragilis NOT DETECTED NOT DETECTED Final   Enterobacterales DETECTED (A) NOT DETECTED Final    Comment: Enterobacterales  represent a large order of gram negative bacteria, not a single organism. CRITICAL RESULT CALLED TO, READ BACK BY AND VERIFIED WITH: A. HAILE RN 09/03/22 @ 0547 BY AB    Enterobacter cloacae complex NOT DETECTED NOT DETECTED Final   Escherichia coli DETECTED (A) NOT DETECTED Final    Comment: CRITICAL RESULT CALLED TO, READ BACK BY AND VERIFIED WITH: A. HAILE RN 09/03/22 @ 0547 BY AB    Klebsiella aerogenes NOT DETECTED NOT DETECTED Final   Klebsiella oxytoca NOT DETECTED NOT DETECTED  Final   Klebsiella pneumoniae NOT DETECTED NOT DETECTED Final   Proteus species NOT DETECTED NOT DETECTED Final   Salmonella species NOT DETECTED NOT DETECTED Final   Serratia marcescens NOT DETECTED NOT DETECTED Final   Haemophilus influenzae NOT DETECTED NOT DETECTED Final   Neisseria meningitidis NOT DETECTED NOT DETECTED Final   Pseudomonas aeruginosa NOT DETECTED NOT DETECTED Final   Stenotrophomonas maltophilia NOT DETECTED NOT DETECTED Final   Candida albicans NOT DETECTED NOT DETECTED Final   Candida auris NOT DETECTED NOT DETECTED Final   Candida glabrata NOT DETECTED NOT DETECTED Final   Candida krusei NOT DETECTED NOT DETECTED Final   Candida parapsilosis NOT DETECTED NOT DETECTED Final   Candida tropicalis NOT DETECTED NOT DETECTED Final   Cryptococcus neoformans/gattii NOT DETECTED NOT DETECTED Final   CTX-M ESBL DETECTED (A) NOT DETECTED Final    Comment: CRITICAL RESULT CALLED TO, READ BACK BY AND VERIFIED WITH: A. HAILE RN 09/03/22 @ 0547 BY AB (NOTE) Extended spectrum beta-lactamase detected. Recommend a carbapenem as initial therapy.      Carbapenem resistance IMP NOT DETECTED NOT DETECTED Final   Carbapenem resistance KPC NOT DETECTED NOT DETECTED Final   Carbapenem resistance NDM NOT DETECTED NOT DETECTED Final   Carbapenem resist OXA 48 LIKE NOT DETECTED NOT DETECTED Final   Carbapenem resistance VIM NOT DETECTED NOT DETECTED Final    Comment: Performed at [facility]  Lab, [address], [place], [place] [address]  Respiratory (~20 pathogens) panel by PCR     Status: None   Collection Time: 09/02/22  3:32 PM   Specimen: Nasopharyngeal Swab; Respiratory  Result Value Ref Range Status   Adenovirus NOT DETECTED NOT DETECTED Final   Coronavirus 229E NOT DETECTED NOT DETECTED Final    Comment: (NOTE) The Coronavirus on the Respiratory Panel, DOES NOT test for the novel  Coronavirus (2019 nCoV)    Coronavirus HKU1 NOT DETECTED NOT DETECTED Final   Coronavirus NL63 NOT DETECTED NOT DETECTED Final   Coronavirus OC43 NOT DETECTED NOT DETECTED Final   Metapneumovirus NOT DETECTED NOT DETECTED Final   Rhinovirus / Enterovirus NOT DETECTED NOT DETECTED Final   Influenza A NOT DETECTED NOT DETECTED Final   Influenza B NOT DETECTED NOT DETECTED Final   Parainfluenza Virus 1 NOT DETECTED NOT DETECTED Final   Parainfluenza Virus 2 NOT DETECTED NOT DETECTED Final   Parainfluenza Virus 3 NOT DETECTED NOT DETECTED Final   Parainfluenza Virus 4 NOT DETECTED NOT DETECTED Final   Respiratory Syncytial Virus NOT DETECTED NOT DETECTED Final   Bordetella pertussis NOT DETECTED NOT DETECTED Final   Bordetella Parapertussis NOT DETECTED NOT DETECTED Final   Chlamydophila pneumoniae NOT DETECTED NOT DETECTED Final   Mycoplasma pneumoniae NOT DETECTED NOT DETECTED Final    Comment: Performed at [facility], [address], [place], [place] [address]  SARS Coronavirus 2 by RT PCR (hospital order, performed in [facility] hospital lab) *cepheid single result test* Anterior Nasal Swab     Status: None   Collection Time: 09/02/22  3:32 PM   Specimen: Anterior Nasal Swab  Result Value Ref Range Status   SARS Coronavirus 2 by RT PCR NEGATIVE NEGATIVE Final    Comment: (NOTE) SARS-CoV-2 target nucleic acids are NOT DETECTED.  The SARS-CoV-2 RNA is generally detectable in upper and lower respiratory specimens during the acute phase of infection. The lowest concentration of  SARS-CoV-2 viral copies this assay can detect is 250 copies / mL. A negative result does  not preclude SARS-CoV-2 infection and should not be used as the sole basis for treatment or other patient management decisions.  A negative result may occur with improper specimen collection / handling, submission of specimen other than nasopharyngeal swab, presence of viral mutation(s) within the areas targeted by this assay, and inadequate number of viral copies (<250 copies / mL). A negative result must be combined with clinical observations, patient history, and epidemiological information.  Fact Sheet for Patients:   RoadLapTop.co.za  Fact Sheet for Healthcare Providers: http://kim-miller.com/  This test is not yet approved or  cleared by the [place] FDA and has been authorized for detection and/or diagnosis of SARS-CoV-2 by FDA under an Emergency Use Authorization (EUA).  This EUA will remain in effect (meaning this test can be used) for the duration of the COVID-19 declaration under Section 564(b)(1) of the Act, 21 U.S.C. section 360bbb-3(b)(1), unless the authorization is terminated or revoked sooner.  Performed at [facility], [address] [place], [place] [address]   MRSA Next Gen by PCR, Nasal     Status: None   Collection Time: 09/02/22  6:27 PM   Specimen: Nasal Mucosa; Nasal Swab  Result Value Ref Range Status   MRSA by PCR Next Gen NOT DETECTED NOT DETECTED Final    Comment: (NOTE) The GeneXpert MRSA Assay (FDA approved for NASAL specimens only), is one component of a comprehensive MRSA colonization surveillance program. It is not intended to diagnose MRSA infection nor to guide or monitor treatment for MRSA infections. Test performance is not FDA approved in patients less than 69 years old. Performed at [facility], [address] [place], [place] [address]      Scheduled Meds:  enoxaparin (LOVENOX) injection  40 mg  Subcutaneous Q24H   FLUoxetine  40 mg Oral Daily   furosemide  40 mg Intravenous Daily   ipratropium-albuterol  3 mL Nebulization TID   metoprolol succinate  100 mg Oral Daily   ondansetron (ZOFRAN) IV  4 mg Intravenous Q6H   pantoprazole (PROTONIX) IV  40 mg Intravenous Q12H   potassium chloride  40 mEq Oral BID   Continuous Infusions:  meropenem (MERREM) IV 1 g (09/06/22 1407)    Procedures/Studies: CT CHEST WO CONTRAST  Result Date: 09/05/2022 CLINICAL DATA:  Abnormal chest radiograph EXAM: CT CHEST WITHOUT CONTRAST TECHNIQUE: Multidetector CT imaging of the chest was performed following the standard protocol without IV contrast. RADIATION DOSE REDUCTION: This exam was performed according to the departmental dose-optimization program which includes automated exposure control, adjustment of the mA and/or kV according to patient size and/or use of iterative reconstruction technique. COMPARISON:  CT chest, abdomen and pelvis dated April 21st 2024 FINDINGS: Cardiovascular: Mild cardiomegaly. Trace pericardial effusion, slightly increased in size when compared with the prior exam. Normal caliber thoracic aorta with moderate atherosclerotic disease. Mild coronary artery calcifications. Mediastinum/Nodes: Esophagus and thyroid are unremarkable. Mildly enlarged mediastinal lymph nodes, unchanged when compared with the prior exam and likely reactive. Reference subcarinal lymph node measuring 1.4 cm in short axis on series 2, image 64. Lungs/Pleura: Central airways are patent. New patchy upper lung predominant ground-glass opacities with associated interlobular septal thickening. New trace right-greater-than-left pleural effusions. Upper Abdomen: Prior cholecystectomy.  Trace perihepatic ascites. Musculoskeletal: No chest wall mass or suspicious bone lesions identified. IMPRESSION: 1. New patchy upper lung predominant ground-glass opacities with associated septal thickening, likely due pulmonary edema or  infection. 2. New trace right-greater-than-left pleural effusions. 3. Trace pericardial effusion, slightly increased in size when  compared with the prior exam. 4. Aortic Atherosclerosis (ICD10-I70.0). Electronically Signed   By: Allegra Lai M.D.   On: 09/05/2022 15:17   ECHOCARDIOGRAM COMPLETE  Result Date: 09/05/2022    ECHOCARDIOGRAM REPORT   Patient Name:   MERCY LEPPLA Date of Exam: 09/05/2022 Medical Rec #:  [id number]      Height:       61.0 in Accession #:    [id number]     Weight:       120.0 lb Date of Birth:  16-Jul-1970     BSA:          1.520 m Patient Age:    51 years       BP:           160/107 mmHg Patient Gender: F              HR:           95 bpm. [facility] Procedure: 2D Echo, Cardiac Doppler, Color Doppler and Intracardiac            Opacification Agent Indications:    CHF-Acute Diastolic I50.31  History:        Patient has no prior history of Echocardiogram examinations.                 Sepsis, Signs/Symptoms:Shortness of Breath; Risk                 Factors:Hypertension and Current Smoker.  Sonographer:    Aron Baba Referring Phys: [phone] DAVID TAT  Sonographer Comments: Image acquisition challenging due to patient body habitus and Image acquisition challenging due to respiratory motion. IMPRESSIONS  1. Left ventricular ejection fraction, by estimation, is 25 to 30%. The left ventricle has severely decreased function. The left ventricle demonstrates regional wall motion abnormalities (see scoring diagram/findings for description). Left ventricular diastolic parameters are consistent with Grade II diastolic dysfunction (pseudonormalization).  2. Right ventricular systolic function is moderately reduced. The right ventricular size is mildly enlarged. There is mildly elevated pulmonary artery systolic pressure.  3. Left atrial size was mild to moderately dilated.  4. A small pericardial effusion is present. The pericardial effusion is circumferential.  5. The mitral valve  is normal in structure. No evidence of mitral valve regurgitation. No evidence of mitral stenosis.  6. The aortic valve was not well visualized. Aortic valve regurgitation is not visualized. No aortic stenosis is present.  7. The inferior vena cava is normal in size with <50% respiratory variability, suggesting right atrial pressure of 8 mmHg. Comparison(s): No prior Echocardiogram. FINDINGS  Left Ventricle: Left ventricular ejection fraction, by estimation, is 25 to 30%. The left ventricle has severely decreased function. The left ventricle demonstrates regional wall motion abnormalities. Definity contrast agent was given IV to delineate the left ventricular endocardial borders. The left ventricular internal cavity size was normal in size. There is no left ventricular hypertrophy. Left ventricular diastolic parameters are consistent with Grade II diastolic dysfunction (pseudonormalization).  LV Wall Scoring: The entire anterior wall and entire septum are akinetic. The entire lateral wall and entire inferior wall are hypokinetic. The apex is normal. Right Ventricle: The right ventricular size is mildly enlarged. No increase in right ventricular wall thickness. Right ventricular systolic function is moderately reduced. There is mildly elevated pulmonary artery systolic pressure. The tricuspid regurgitant velocity is 2.86 m/s, and with an assumed right atrial pressure of 8 mmHg, the estimated right ventricular systolic pressure is 40.7 mmHg. Left Atrium:  Left atrial size was mild to moderately dilated. Right Atrium: Right atrial size was normal in size. Pericardium: A small pericardial effusion is present. The pericardial effusion is circumferential. Mitral Valve: The mitral valve is normal in structure. No evidence of mitral valve regurgitation. No evidence of mitral valve stenosis. Tricuspid Valve: The tricuspid valve is normal in structure. Tricuspid valve regurgitation is mild . No evidence of tricuspid stenosis.  Aortic Valve: The aortic valve was not well visualized. Aortic valve regurgitation is not visualized. No aortic stenosis is present. Pulmonic Valve: The pulmonic valve was not well visualized. Pulmonic valve regurgitation is mild. No evidence of pulmonic stenosis. Aorta: The aortic root and ascending aorta are structurally normal, with no evidence of dilitation. Venous: The inferior vena cava is normal in size with less than 50% respiratory variability, suggesting right atrial pressure of 8 mmHg. IAS/Shunts: No atrial level shunt detected by color flow Doppler.  LEFT VENTRICLE PLAX 2D LVIDd:         4.70 cm   Diastology LVIDs:         3.30 cm   LV e' medial:    8.73 cm/s LV PW:         1.10 cm   LV E/e' medial:  14.0 LV IVS:        0.70 cm   LV e' lateral:   7.89 cm/s LVOT diam:     1.70 cm   LV E/e' lateral: 15.5 LV SV:         32 LV SV Index:   21 LVOT Area:     2.27 cm  RIGHT VENTRICLE RV S prime:     7.80 cm/s TAPSE (M-mode): 1.6 cm LEFT ATRIUM             Index        RIGHT ATRIUM           Index LA diam:        3.30 cm 2.17 cm/m   RA Area:     13.60 cm LA Vol (A2C):   56.9 ml 37.43 ml/m  RA Volume:   32.30 ml  21.25 ml/m LA Vol (A4C):   69.1 ml 45.46 ml/m LA Biplane Vol: 67.1 ml 44.14 ml/m  AORTIC VALVE             PULMONIC VALVE LVOT Vmax:   81.50 cm/s  PR End Diast Vel: 13.62 msec LVOT Vmean:  53.400 cm/s LVOT VTI:    0.139 m  AORTA Ao Root diam: 2.90 cm Ao Asc diam:  3.00 cm MITRAL VALVE                TRICUSPID VALVE MV Area (PHT): 7.16 cm     TR Peak grad:   32.7 mmHg MV Decel Time: 106 msec     TR Vmax:        286.00 cm/s MV E velocity: 122.00 cm/s MV A velocity: 64.90 cm/s   SHUNTS MV E/A ratio:  1.88         Systemic VTI:  0.14 m                             Systemic Diam: 1.70 cm Vishnu Priya Mallipeddi Electronically signed by Winfield Rast Mallipeddi Signature Date/Time: 09/05/2022/12:09:15 PM    Final    DG CHEST PORT 1 VIEW  Result Date: 09/04/2022 CLINICAL DATA:  Respiratory distress  EXAM: PORTABLE CHEST 1 VIEW COMPARISON:  Chest  radiograph 05/16/2018.  CT chest 09/02/2022 FINDINGS: Heart size and pulmonary vascularity are normal for technique. Diffuse nodular infiltrative pattern throughout the lungs demonstrating progression since previous studies. This could represent atypical infection, pneumoconiosis, or less likely metastatic disease. No pleural effusion. No pneumothorax. Mediastinal contours appear intact. IMPRESSION: Diffuse nodular infiltrative pattern throughout the lungs demonstrating progression since previous studies. Electronically Signed   By: Burman Nieves M.D.   On: 09/04/2022 19:16   CT Angio Chest/Abd/Pel for Dissection W and/or W/WO  Result Date: 09/02/2022 CLINICAL DATA:  Severe abdominal pain with nausea and vomiting. Concern for acute aortic syndrome. EXAM: CT ANGIOGRAPHY CHEST, ABDOMEN AND PELVIS TECHNIQUE: Non-contrast CT of the chest was initially obtained. Multidetector CT imaging through the chest, abdomen and pelvis was performed using the standard protocol during bolus administration of intravenous contrast. Multiplanar reconstructed images and MIPs were obtained and reviewed to evaluate the vascular anatomy. RADIATION DOSE REDUCTION: This exam was performed according to the departmental dose-optimization program which includes automated exposure control, adjustment of the mA and/or kV according to patient size and/or use of iterative reconstruction technique. CONTRAST:  OMNIPAQUE IOHEXOL 350 MG/ML SOLN COMPARISON:  CT abdomen pelvis dated 06/05/2022 and chest radiograph dated 05/16/2018. FINDINGS: CTA CHEST FINDINGS Cardiovascular: Preferential opacification of the thoracic aorta. No evidence of thoracic aortic aneurysm, intramural hematoma, or dissection. Heart is mildly enlarged. Trace pericardial effusion. Mediastinum/Nodes: No enlarged mediastinal, hilar, or axillary lymph nodes. Thyroid gland, trachea, and esophagus demonstrate no significant  findings. Lungs/Pleura: Moderate bilateral ground-glass opacities with areas of nodular consolidation are noted. Trace bilateral pleural effusions. No pneumothorax. Musculoskeletal: A chronic unfused distal right clavicle fracture versus degenerative changes is noted. No acute osseous injury. Review of the MIP images confirms the above findings. CTA ABDOMEN AND PELVIS FINDINGS VASCULAR Aorta: Atherosclerotic disease without aneurysm, dissection, vasculitis or significant stenosis. Celiac: Atherosclerotic disease without evidence of aneurysm, dissection, vasculitis or significant stenosis. SMA: Patent without evidence of aneurysm, dissection, vasculitis or significant stenosis. Renals: Both renal arteries are patent without evidence of aneurysm, dissection, vasculitis, fibromuscular dysplasia or significant stenosis. IMA: Patent without evidence of aneurysm, dissection, vasculitis or significant stenosis. Inflow: Atherosclerotic disease without evidence of aneurysm, dissection, vasculitis or significant stenosis. Veins: No obvious venous abnormality within the limitations of this arterial phase study. Review of the MIP images confirms the above findings. NON-VASCULAR Hepatobiliary: No focal liver abnormality is seen. No gallstones, gallbladder wall thickening, or biliary dilatation. Pancreas: Unremarkable. No pancreatic ductal dilatation or surrounding inflammatory changes. Spleen: Normal in size without focal abnormality. Adrenals/Urinary Tract: Adrenal glands are unremarkable. There is a focal area of swelling and decreased enhancement involving the anterior/inferior pole of the left kidney (series 11, images 120 6-28). There is a mild fat stranding and edema in the left perinephric space. Septated cystic lesion in the upper left kidney is unchanged, measuring 3.9 x 2.9 cm. No renal calculi on the left. The right kidney appears normal, without renal calculi, focal lesion, or hydronephrosis. Bladder is unremarkable.  Stomach/Bowel: Stomach is within normal limits. There is bowel wall thickening and mucosal hyperenhancement of the ascending colon and proximal transverse colon. The appendix appears normal. No evidence of bowel obstruction. Lymphatic: No abnormally enlarged lymph nodes in the abdomen or pelvis. Reproductive: Status post hysterectomy. No adnexal masses. Other: No abdominal wall hernia or abnormality. No abdominopelvic ascites. Musculoskeletal: No acute or significant osseous findings. Review of the MIP images confirms the above findings. IMPRESSION: 1. Focal area of swelling and decreased enhancement involving the anterior/inferior  pole of the left kidney with mild fat stranding and edema in the left perinephric space. Findings are concerning for focal pyelonephritis. 2. Bowel wall thickening and mucosal hyperenhancement of the ascending colon and proximal transverse colon is concerning for colitis. 3. No aortic aneurysm, intramural hematoma, or dissection. 4. Moderate bilateral ground-glass opacities with areas of nodular consolidation and trace bilateral pleural effusions, likely multifocal pneumonia. 5. Mild cardiomegaly and trace pericardial effusion. 6. Septated cystic structure at the upper pole the left kidney was described as Bosniak 19F on prior MRI of the abdomen from 12/23/2020 with recommendation for 1 year follow-up. Aortic Atherosclerosis (ICD10-I70.0). Electronically Signed   By: Romona Curls M.D.   On: 09/02/2022 13:38    Hildred Pharo Laural Benes, MD   [facility]  If 7PM-7AM, please contact night-coverage [email or web address] Password [facility] 09/06/2022, 2:56 PM   LOS: 4 days

## 2022-09-06 NOTE — Plan of Care (Addendum)
Id/asp brief note   Discussed with dr Standley Dakins Esbl urinary source with bacteremia    A/p Sepsis Pyelo   Will need 10 day treatment  -no oral option -continue meropenem in house and on discharge change to daily ertapenem for total 10 days (meropenem and ertapenem duration) -stop azithromycin -no need for id f/u -if patient discharge home before 10 days duration, please let us/id pharmacy know to put in opat; if discharge to nursing home no need for opat order -no need for id f/u -discussed with dr Laural Benes

## 2022-09-06 NOTE — Progress Notes (Signed)
Pharmacy Antibiotic Note  [name] is a 52 y.o. female admitted on 09/02/2022 with bacteremia.  Pharmacy has been consulted for blood culture positive for ESBL E.Coli dosing.  Plan: Meropenem 1g IV q 8 hrs. F/u cultures, clinical course. F/U ID consult/OPAT for discharge  Weight: 59.3 kg (130 lb 11.7 oz)  Temp (24hrs), Avg:97.9 F (36.6 C), Min:97.3 F (36.3 C), Max:98.4 F (36.9 C)  Recent Labs  Lab 09/02/22 1114 09/02/22 1313 09/02/22 1457 09/02/22 1754 09/03/22 0450 09/04/22 0403 09/05/22 0358 09/06/22 0418  WBC 16.9*  --   --   --   --  17.2* 17.8* 17.2*  CREATININE 0.92  --   --   --  0.80 0.78 0.81 0.66  LATICACIDVEN  --  1.1 5.0* 2.7*  --   --   --   --      Estimated Creatinine Clearance: 67.3 mL/min (by C-G formula based on SCr of 0.66 mg/dL).    Allergies  Allergen Reactions   Buprenorphine Other (See Comments)    Chest pain/ringing in ears and feet swelling   Penicillins Swelling and Rash        Aripiprazole Other (See Comments)    seizure   Seroquel [Quetiapine] Other (See Comments)    Seizure (08/18/20)   Cefaclor Other (See Comments)    Muscles locked up   Doxycycline Rash   Gabapentin Nausea And Vomiting    dizzy   Ibuprofen Swelling and Rash   Levofloxacin Rash    Itching   Lisinopril Nausea And Vomiting   Macrobid [Nitrofurantoin Monohyd Macro] Rash   Naproxen Swelling and Rash   Ctx 4/22 Azith 4/21>> Flagyl 4/22 Meropenem 4/22>  4/21 bld x2: ESBL - CTM-x Dot Lanes, PharmD Clinical Pharmacist 09/06/2022 9:22 AM

## 2022-09-06 NOTE — TOC Initial Note (Signed)
Transition of Care [facility]) - Initial/Assessment Note    Patient Details  Name: [name] MRN: [id number] Date of Birth: 1971-03-12  Transition of Care [facility]) CM/SW Contact:    Elliot Gault, LCSW Phone Number: 09/06/2022, 11:28 AM  Clinical Narrative:                  Pt from home and will need IV anbx at home upon dc per MD. Anticipating dc in 2-3 days.  Pt plans to return home at dc. Referral given to [facility] with [facility] and she will follow and arrange for the [facility] as well.  TOC will follow.  Expected Discharge Plan: Home w Home Health Services Barriers to Discharge: Continued Medical Work up   Patient Goals and CMS Choice Patient states their goals for this hospitalization and ongoing recovery are:: go home          Expected Discharge Plan and Services In-house Referral: Clinical Social Work   Post Acute Care Choice: Home Health Living arrangements for the past 2 months: Single Family Home                             HH Agency: [facility] Date HH Agency Contacted: 09/06/22   Representative spoke with at [facility] Agency: Pam  Prior Living Arrangements/Services Living arrangements for the past 2 months: Single Family Home Lives with:: Spouse Patient language and need for interpreter reviewed:: Yes Do you feel safe going back to the place where you live?: Yes      Need for Family Participation in Patient Care: No (Comment)     Criminal Activity/Legal Involvement Pertinent to Current Situation/Hospitalization: No - Comment as needed  Activities of Daily Living      Permission Sought/Granted Permission sought to share information with : Facility Industrial/product designer granted to share information with : Yes, Verbal Permission Granted     Permission granted to share info w AGENCY: Home Infusion        Emotional Assessment       Orientation: : Oriented to Self, Oriented to Place, Oriented to  Time, Oriented to Situation Alcohol / Substance  Use: Not Applicable Psych Involvement: No (comment)  Admission diagnosis:  Cardiomegaly [I51.7] Colitis [K52.9] Pyelonephritis [N12] Sepsis due to undetermined organism [A41.9] Pericardial effusion without cardiac tamponade [I31.39] Multifocal pneumonia [J18.9] Patient Active Problem List   Diagnosis Date Noted   Anemia 09/05/2022   Periumbilical abdominal pain 09/04/2022   UGIB (upper gastrointestinal bleed) 09/04/2022   Sepsis due to Escherichia coli (E. coli) 09/03/2022   Gram-negative bacteremia 09/03/2022   Pyelonephritis 09/02/2022   Multifocal pneumonia 09/02/2022   Essential hypertension 09/02/2022   Sepsis due to undetermined organism 09/02/2022   Generalized abdominal pain 08/23/2022   Piriformis syndrome of both sides 09/05/2021   Gastroesophageal reflux disease 04/11/2021   Abdominal pain, chronic, epigastric 04/11/2021   Constipation 04/11/2021   Hypokalemia    Uncontrolled pain 11/12/2020   Nausea and vomiting 11/12/2020   Colitis 08/18/2020   RLQ abdominal pain    Diarrhea    Sacroiliac joint pain 01/07/2020   SI joint arthritis 05/19/2018   Long term prescription opiate use 05/19/2018   Lumbar radiculopathy 02/04/2017   Lumbar degenerative disc disease 02/04/2017   Lumbar spondylosis 02/04/2017   Chronic low back pain with bilateral sciatica 02/04/2017   Chronic pain syndrome 02/04/2017   Lumbar sprain 02/04/2017   Chronic migraine without aura without status migrainosus, not intractable 02/04/2017  PCP:  Smith Robert, MD (Inactive) Pharmacy:   [facility] - [place], [place] - [address] [place] [address] Phone: [phone] Fax: [phone]     Social Determinants of Health (SDOH) Social History: SDOH Screenings   Food Insecurity: No Food Insecurity (03/19/2017)  Transportation Needs: No Transportation Needs (03/19/2017)  Depression (PHQ2-9): Low Risk  (11/29/2020)  Financial Resource Strain: Low Risk   (03/19/2017)  Physical Activity: Inactive (03/19/2017)  Social Connections: Somewhat Isolated (03/19/2017)  Stress: Stress Concern Present (03/19/2017)  Tobacco Use: High Risk (09/03/2022)   SDOH Interventions:     Readmission Risk Interventions     No data to display

## 2022-09-06 NOTE — Progress Notes (Signed)
Called to patient's room.  Patient stated she needed a breathing treatment, felt like she could not get enough air.  On assessment, BS revealed wheezing in all bases, sat at 97% on 3L, patient continously clearing throat and coughing.  Treatment given.

## 2022-09-06 NOTE — Progress Notes (Signed)
Subjective: Nauseated. No vomiting. Tolerated clear liquids. Worsened her abdominal pain. Continues with post prandial 8/10 abdominal pain. Overall, pain is the same since admission. Reports having 5 BMs today. Loose. Reports black stool. No blood.  Discussed with nurse. Patient is having liquid stool. They have not submitted a sample due to patient urinating at the same time as a BM. Reports stools are dark, not black.   Not getting dicyclomine which has helped her pain in the past.   Also with SOB and chest pain when taking a breath along with cough. Present since pneumonia started. On 3L.    ECHO yesterday with EF 25-30%, grade 2 diastolic dysfunction, circumferential pericardial effusion.   Objective: Vital signs in last 24 hours: Temp:  [97.3 F (36.3 C)-98.2 F (36.8 C)] 98.2 F (36.8 C) (04/25 0341) Pulse Rate:  [75-99] 75 (04/25 0826) Resp:  [20-22] 20 (04/25 0826) BP: (134-150)/(77-98) 134/77 (04/25 0804) SpO2:  [91 %-98 %] 97 % (04/25 0436) Last BM Date : 09/04/22 General:   Alert and oriented, pleasant, on [place], constantly clearing throat.  Head:  Normocephalic and atraumatic. Eyes:  No icterus. Abdomen:  Bowel sounds present, abdomen is soft with generalized tenderness to palpation. No rebound or guarding. No masses appreciated  Msk:  Symmetrical without gross deformities. Normal posture. Extremities:  Without edema. Neurologic:  Alert and  oriented x4;  grossly normal neurologically. Skin:  Warm and dry, intact without significant lesions.  Psych: Normal mood and affect.  Intake/Output from previous day: 04/24 0701 - 04/25 0700 In: 1401.1 [P.O.:720; IV Piggyback:681.1] Out: 700 [Urine:700] Intake/Output this shift: Total I/O In: -  Out: 1900 [Urine:1900]  Lab Results: Recent Labs    09/04/22 0403 09/04/22 0656 09/05/22 0358 09/06/22 0418  WBC 17.2*  --  17.8* 17.2*  HGB 6.6* 6.5* 12.4 12.5  HCT 22.5* 21.6* 37.9 40.1  PLT 237  --  247 281    BMET Recent Labs    09/04/22 0403 09/05/22 0358 09/06/22 0418  NA 130* 131* 136  K 3.7 3.2* 3.2*  CL 102 98 103  CO2 19* 22 23  GLUCOSE 88 93 83  BUN 10 7 9   CREATININE 0.78 0.81 0.66  CALCIUM 6.5* 7.4* 7.3*   LFT Recent Labs    09/04/22 0403 09/05/22 0358 09/06/22 0418  PROT 6.3* 7.4 5.9*  ALBUMIN 1.7* 2.0* 1.6*  AST 73* 157* 86*  ALT 34 61* 46*  ALKPHOS 148* 185* 123  BILITOT 0.5 0.8 0.7    Studies/Results: CT CHEST WO CONTRAST  Result Date: 09/05/2022 CLINICAL DATA:  Abnormal chest radiograph EXAM: CT CHEST WITHOUT CONTRAST TECHNIQUE: Multidetector CT imaging of the chest was performed following the standard protocol without IV contrast. RADIATION DOSE REDUCTION: This exam was performed according to the departmental dose-optimization program which includes automated exposure control, adjustment of the mA and/or kV according to patient size and/or use of iterative reconstruction technique. COMPARISON:  CT chest, abdomen and pelvis dated April 21st 2024 FINDINGS: Cardiovascular: Mild cardiomegaly. Trace pericardial effusion, slightly increased in size when compared with the prior exam. Normal caliber thoracic aorta with moderate atherosclerotic disease. Mild coronary artery calcifications. Mediastinum/Nodes: Esophagus and thyroid are unremarkable. Mildly enlarged mediastinal lymph nodes, unchanged when compared with the prior exam and likely reactive. Reference subcarinal lymph node measuring 1.4 cm in short axis on series 2, image 64. Lungs/Pleura: Central airways are patent. New patchy upper lung predominant ground-glass opacities with associated interlobular septal thickening. New trace right-greater-than-left pleural effusions. Upper  Abdomen: Prior cholecystectomy.  Trace perihepatic ascites. Musculoskeletal: No chest wall mass or suspicious bone lesions identified. IMPRESSION: 1. New patchy upper lung predominant ground-glass opacities with associated septal thickening,  likely due pulmonary edema or infection. 2. New trace right-greater-than-left pleural effusions. 3. Trace pericardial effusion, slightly increased in size when compared with the prior exam. 4. Aortic Atherosclerosis (ICD10-I70.0). Electronically Signed   By: Allegra Lai M.D.   On: 09/05/2022 15:17   ECHOCARDIOGRAM COMPLETE  Result Date: 09/05/2022    ECHOCARDIOGRAM REPORT   Patient Name:   [name] Date of Exam: 09/05/2022 Medical Rec #:  [id number]      Height:       61.0 in Accession #:    [id number]     Weight:       120.0 lb Date of Birth:  [birth year]     BSA:          1.520 m Patient Age:    52 years       BP:           160/107 mmHg Patient Gender: F              HR:           95 bpm. [facility] Procedure: 2D Echo, Cardiac Doppler, Color Doppler and Intracardiac            Opacification Agent Indications:    CHF-Acute Diastolic I50.31  History:        Patient has no prior history of Echocardiogram examinations.                 Sepsis, Signs/Symptoms:Shortness of Breath; Risk                 Factors:Hypertension and Current Smoker.  Sonographer:    Aron Baba Referring Phys: [phone] DAVID TAT  Sonographer Comments: Image acquisition challenging due to patient body habitus and Image acquisition challenging due to respiratory motion. IMPRESSIONS  1. Left ventricular ejection fraction, by estimation, is 25 to 30%. The left ventricle has severely decreased function. The left ventricle demonstrates regional wall motion abnormalities (see scoring diagram/findings for description). Left ventricular diastolic parameters are consistent with Grade II diastolic dysfunction (pseudonormalization).  2. Right ventricular systolic function is moderately reduced. The right ventricular size is mildly enlarged. There is mildly elevated pulmonary artery systolic pressure.  3. Left atrial size was mild to moderately dilated.  4. A small pericardial effusion is present. The pericardial effusion is  circumferential.  5. The mitral valve is normal in structure. No evidence of mitral valve regurgitation. No evidence of mitral stenosis.  6. The aortic valve was not well visualized. Aortic valve regurgitation is not visualized. No aortic stenosis is present.  7. The inferior vena cava is normal in size with <50% respiratory variability, suggesting right atrial pressure of 8 mmHg. Comparison(s): No prior Echocardiogram. FINDINGS  Left Ventricle: Left ventricular ejection fraction, by estimation, is 25 to 30%. The left ventricle has severely decreased function. The left ventricle demonstrates regional wall motion abnormalities. Definity contrast agent was given IV to delineate the left ventricular endocardial borders. The left ventricular internal cavity size was normal in size. There is no left ventricular hypertrophy. Left ventricular diastolic parameters are consistent with Grade II diastolic dysfunction (pseudonormalization).  LV Wall Scoring: The entire anterior wall and entire septum are akinetic. The entire lateral wall and entire inferior wall are hypokinetic. The apex is normal. Right Ventricle: The right ventricular size is  mildly enlarged. No increase in right ventricular wall thickness. Right ventricular systolic function is moderately reduced. There is mildly elevated pulmonary artery systolic pressure. The tricuspid regurgitant velocity is 2.86 m/s, and with an assumed right atrial pressure of 8 mmHg, the estimated right ventricular systolic pressure is 40.7 mmHg. Left Atrium: Left atrial size was mild to moderately dilated. Right Atrium: Right atrial size was normal in size. Pericardium: A small pericardial effusion is present. The pericardial effusion is circumferential. Mitral Valve: The mitral valve is normal in structure. No evidence of mitral valve regurgitation. No evidence of mitral valve stenosis. Tricuspid Valve: The tricuspid valve is normal in structure. Tricuspid valve regurgitation is mild  . No evidence of tricuspid stenosis. Aortic Valve: The aortic valve was not well visualized. Aortic valve regurgitation is not visualized. No aortic stenosis is present. Pulmonic Valve: The pulmonic valve was not well visualized. Pulmonic valve regurgitation is mild. No evidence of pulmonic stenosis. Aorta: The aortic root and ascending aorta are structurally normal, with no evidence of dilitation. Venous: The inferior vena cava is normal in size with less than 50% respiratory variability, suggesting right atrial pressure of 8 mmHg. IAS/Shunts: No atrial level shunt detected by color flow Doppler.  LEFT VENTRICLE PLAX 2D LVIDd:         4.70 cm   Diastology LVIDs:         3.30 cm   LV e' medial:    8.73 cm/s LV PW:         1.10 cm   LV E/e' medial:  14.0 LV IVS:        0.70 cm   LV e' lateral:   7.89 cm/s LVOT diam:     1.70 cm   LV E/e' lateral: 15.5 LV SV:         32 LV SV Index:   21 LVOT Area:     2.27 cm  RIGHT VENTRICLE RV S prime:     7.80 cm/s TAPSE (M-mode): 1.6 cm LEFT ATRIUM             Index        RIGHT ATRIUM           Index LA diam:        3.30 cm 2.17 cm/m   RA Area:     13.60 cm LA Vol (A2C):   56.9 ml 37.43 ml/m  RA Volume:   32.30 ml  21.25 ml/m LA Vol (A4C):   69.1 ml 45.46 ml/m LA Biplane Vol: 67.1 ml 44.14 ml/m  AORTIC VALVE             PULMONIC VALVE LVOT Vmax:   81.50 cm/s  PR End Diast Vel: 13.62 msec LVOT Vmean:  53.400 cm/s LVOT VTI:    0.139 m  AORTA Ao Root diam: 2.90 cm Ao Asc diam:  3.00 cm MITRAL VALVE                TRICUSPID VALVE MV Area (PHT): 7.16 cm     TR Peak grad:   32.7 mmHg MV Decel Time: 106 msec     TR Vmax:        286.00 cm/s MV E velocity: 122.00 cm/s MV A velocity: 64.90 cm/s   SHUNTS MV E/A ratio:  1.88         Systemic VTI:  0.14 m  Systemic Diam: 1.70 cm Vishnu Priya Mallipeddi Electronically signed by Winfield Rast Mallipeddi Signature Date/Time: 09/05/2022/12:09:15 PM    Final    DG CHEST PORT 1 VIEW  Result Date:  09/04/2022 CLINICAL DATA:  Respiratory distress EXAM: PORTABLE CHEST 1 VIEW COMPARISON:  Chest radiograph 05/16/2018.  CT chest 09/02/2022 FINDINGS: Heart size and pulmonary vascularity are normal for technique. Diffuse nodular infiltrative pattern throughout the lungs demonstrating progression since previous studies. This could represent atypical infection, pneumoconiosis, or less likely metastatic disease. No pleural effusion. No pneumothorax. Mediastinal contours appear intact. IMPRESSION: Diffuse nodular infiltrative pattern throughout the lungs demonstrating progression since previous studies. Electronically Signed   By: Burman Nieves M.D.   On: 09/04/2022 19:16    Assessment: 52 y.o. year old female with history of GERD, candida esophagitis in Feb 2023, chronic nausea, constipation, prior episodes of colitis but colonoscopy unrevealing with normal colonic biopsies in 2023, presenting to the ED with worsening abdominal pain. She was admitted with sepsis in setting of bacteremia, pyelonephritis, pneumonia, concern for colitis on CT.   Colitis:  CTA on 4/21 with bowel wall thickening, mucosal hyperenhancement of ascending and proximal transverse colon. On IV antibiotics. Reports loose stools for the last few days and stopped Linzess at home. Loose stools continue. Prior episodes of colitis in Jan 2024 and April 2022, with 2022 CT concerning for Crohn's colitis.Colonoscopy Feb 2023 normal with normal colonic biopsies though this was after a course of steroids. Etiology of recurrent colitis is unclear. Stool studies ordered Tuesday but haven't been collected. I personally requested nursing staff to collect stool sample today as she continues with loose stools, unchanged abdominal pain, and leukocytosis thought leukocytosis is multifactorial.  Would recommend updating colonoscopy outpatient once clinically improved.  Patient was requesting dicyclomine to help with her abdominal pain, but advised we need  to hold off on this until infection is rule out.   Anemia:  Hemoglobin 8.4 on admission, down from 13.2 in January 2024. Hemoccult negative. Iron low at 10, saturation ratio low at 4%, ferritin 31. Hemoglobin declined to 6.6 on 4/23. She received 2 units PRBCs on 4/24 and hemoglobin has improved to 12.5. She has had no overt GI bleeding. She is at risk for PUD in the setting of 1-2 BC powders daily for headaches prior to admission. Notably, patient has reported most of her abdominal pain is in the upper abdomen which may be secondary to colitis, but PUD/gastritis/duodenitis may be contributing.  We had been planning on EGD once medically stable; however, patient's echocardiogram yesterday shows EF of 25-30%, circumferential pericardial effusion. She continues with shortness of breath and CP with inspiration.  Cardiology has been consulted today.    Patient likely not a candidate for procedures anytime soon and may not be a candidate at [facility] due to low EF.  As hemoglobin has improved significantly and she has no overt GI bleeding, would recommend holding off on endoscopic evaluation at this point. Consider outpatient.     Plan: Requested nursing staff to collect stool studies today regardless if some urine is present.  Continue broad-spectrum antibiotics. Continue to monitor H&H and for overt GI bleeding. Continue PPI BID.  Avoid NSAIDs.  Continue clear liquid diet for now. Analgesics per hospitalists.  Continue scheduled Zofran.  Recommend holding off on EGD.  Consider outpatient EGD and colonoscopy once medically improved; however, patient may not be a candidate for procedures at [facility] due to low EF.    LOS: 4 days  09/06/2022, 3:00 PM   Ermalinda Memos, [facility]

## 2022-09-07 ENCOUNTER — Inpatient Hospital Stay (HOSPITAL_COMMUNITY): Payer: Medicaid Other

## 2022-09-07 ENCOUNTER — Other Ambulatory Visit: Payer: Self-pay

## 2022-09-07 DIAGNOSIS — A414 Sepsis due to anaerobes: Secondary | ICD-10-CM

## 2022-09-07 DIAGNOSIS — I5041 Acute combined systolic (congestive) and diastolic (congestive) heart failure: Secondary | ICD-10-CM

## 2022-09-07 DIAGNOSIS — I429 Cardiomyopathy, unspecified: Secondary | ICD-10-CM

## 2022-09-07 LAB — GASTROINTESTINAL PANEL BY PCR, STOOL (REPLACES STOOL CULTURE)

## 2022-09-07 LAB — CBC
HCT: 34.4 % — ABNORMAL LOW (ref 36.0–46.0)
Hemoglobin: 10.7 g/dL — ABNORMAL LOW (ref 12.0–15.0)
MCH: 26.5 pg (ref 26.0–34.0)
MCHC: 31.1 g/dL (ref 30.0–36.0)
MCV: 85.1 fL (ref 80.0–100.0)
Platelets: 300 10*3/uL (ref 150–400)
RBC: 4.04 MIL/uL (ref 3.87–5.11)
RDW: 19.6 % — ABNORMAL HIGH (ref 11.5–15.5)
WBC: 12.5 10*3/uL — ABNORMAL HIGH (ref 4.0–10.5)
nRBC: 0 % (ref 0.0–0.2)

## 2022-09-07 LAB — CULTURE, BLOOD (ROUTINE X 2): Culture: NO GROWTH

## 2022-09-07 LAB — COMPREHENSIVE METABOLIC PANEL
ALT: 51 U/L — ABNORMAL HIGH (ref 0–44)
AST: 95 U/L — ABNORMAL HIGH (ref 15–41)
Albumin: 1.9 g/dL — ABNORMAL LOW (ref 3.5–5.0)
Alkaline Phosphatase: 122 U/L (ref 38–126)
Anion gap: 10 (ref 5–15)
BUN: 6 mg/dL (ref 6–20)
CO2: 28 mmol/L (ref 22–32)
Calcium: 7.4 mg/dL — ABNORMAL LOW (ref 8.9–10.3)
Chloride: 95 mmol/L — ABNORMAL LOW (ref 98–111)
Creatinine, Ser: 0.68 mg/dL (ref 0.44–1.00)
GFR, Estimated: >60 mL/min (ref >60)
Glucose, Bld: 86 mg/dL (ref 70–99)
Potassium: 3.5 mmol/L (ref 3.5–5.1)
Sodium: 133 mmol/L — ABNORMAL LOW (ref 135–145)
Total Bilirubin: 0.8 mg/dL (ref 0.3–1.2)
Total Protein: 7 g/dL (ref 6.5–8.1)

## 2022-09-07 LAB — BRAIN NATRIURETIC PEPTIDE: B Natriuretic Peptide: 1443 pg/mL — ABNORMAL HIGH (ref 0.0–100.0)

## 2022-09-07 LAB — MAGNESIUM: Magnesium: 1.3 mg/dL — ABNORMAL LOW (ref 1.7–2.4)

## 2022-09-07 MED ORDER — SACUBITRIL-VALSARTAN 24-26 MG PO TABS
1.0000 | ORAL_TABLET | Freq: Two times a day (BID) | ORAL | Status: DC
  Administered 2022-09-07 – 2022-09-09 (×5): 1 via ORAL
  Filled 2022-09-07 (×5): qty 1

## 2022-09-07 MED ORDER — ONDANSETRON HCL 4 MG/2ML IJ SOLN
4.0000 mg | Freq: Four times a day (QID) | INTRAMUSCULAR | Status: DC | PRN
  Administered 2022-09-07 – 2022-09-09 (×2): 4 mg via INTRAVENOUS
  Filled 2022-09-07 (×3): qty 2

## 2022-09-07 MED ORDER — SODIUM CHLORIDE 0.9% FLUSH
10.0000 mL | Freq: Two times a day (BID) | INTRAVENOUS | Status: DC
  Administered 2022-09-07 – 2022-09-09 (×4): 10 mL

## 2022-09-07 MED ORDER — SODIUM CHLORIDE 0.9% FLUSH: 10.0000 mL | INTRAVENOUS | Status: DC | PRN

## 2022-09-07 MED ORDER — ERTAPENEM IV (FOR PTA / DISCHARGE USE ONLY)
1.0000 g | INTRAVENOUS | 0 refills | Status: AC
Start: 2022-09-07 — End: 2022-09-12

## 2022-09-07 MED ORDER — METOPROLOL SUCCINATE ER 25 MG PO TB24
25.0000 mg | ORAL_TABLET | Freq: Every day | ORAL | Status: DC
  Administered 2022-09-08 – 2022-09-09 (×2): 25 mg via ORAL
  Filled 2022-09-07 (×2): qty 1

## 2022-09-07 MED ORDER — CHLORHEXIDINE GLUCONATE CLOTH 2 % EX PADS
6.0000 | MEDICATED_PAD | Freq: Every day | CUTANEOUS | Status: DC
  Administered 2022-09-07 – 2022-09-09 (×3): 6 via TOPICAL

## 2022-09-07 MED ORDER — MAGNESIUM SULFATE 4 GM/100ML IV SOLN
4.0000 g | Freq: Once | INTRAVENOUS | Status: AC
  Administered 2022-09-07: 4 g via INTRAVENOUS
  Filled 2022-09-07: qty 100

## 2022-09-07 MED ORDER — PROCHLORPERAZINE EDISYLATE 10 MG/2ML IJ SOLN
10.0000 mg | INTRAMUSCULAR | Status: DC | PRN
  Administered 2022-09-07 – 2022-09-09 (×3): 10 mg via INTRAVENOUS
  Filled 2022-09-07 (×3): qty 2

## 2022-09-07 NOTE — Progress Notes (Signed)
Patient rested little through the night. Patient presents with pain during the night. PRN meds given. Patient is difficult to arouse after PRN Fentanyl is given. Once awake vitals stable.

## 2022-09-07 NOTE — Progress Notes (Signed)
PROGRESS NOTE  [name] ZOX:[id number] DOB: [birth year] DOA: 09/02/2022 PCP: Smith Robert, MD (Inactive)  Brief History:  52 year old female with a history of hypertension, chronic abdominal pain, IBS, colitis, constipation, chronic nausea presenting with worsening generalized abdominal pain Last 24 hours prior to admission.  The patient's spouse at the bedside supplements the history.  The patient is a difficult historian.  Notably, the patient's spouse states that the patient has developed chest congestion, coughing, and some dyspnea on exertion over the past week.  She continues to smoke 1 pack/day.  She has not been on any recent antibiotics.  She has had some generalized weakness.  She has had decreased oral intake.  However, in the last 24 hours prior to admission she developed numerous episodes of nausea and vomiting.  There is no hematemesis.  The patient has not had any diarrhea.  There is no hematochezia or melena.  Spouse also relates that the patient has had some symptoms of urinary urgency in the last day to 2 days.  The patient normally takes BC powders 2 times per week for pain.  There is no hematuria or hemoptysis.  There is no hematemesis. Because of her worsening condition, the patient was brought to emergency department for further evaluation and treatment.  The patient has an extensive GI history for her abdominal pain and IBS and colitis.  Notably, in April 2022 the patient was noted to have colonic wall thickening/colitis on CT.  She was  treated empirically for IBD/suspected Crohn's with prednisone and Entocort with follow-up colonoscopy in February 2023 (off all steroids for several months)   normal other than nonbleeding internal hemorrhoids and normal colon biopsies.  More recently, the patient was seen in the emergency department on 05/27/2022 with worsening abdominal pain, persistent vomiting throughout the day. CT A/P showed diffuse colonic wall thickening  extending from distal transverse colon throughout the rectosigmoid junction consistent with inflammatory or infectious colitis. She was given a 5-day course of prednisone. She also had CT angiography 06/05/2022 that showed patent mesenteric vasculature. She was prescribed a 5-day course of Cipro.  She was most recently in the GI clinic on 08/23/2022 for her abdominal pain, IBS, and possible colitis.  It was felt that her chronic abdominal pain was multifactorial including IBS, bowel hypersensitivity syndrome, and functional abdominal pain.  She was instructed to continue dicyclomine and Linzess. Regarding her chronic nausea and vomiting she has had extensive workup withMRI abdomen in 2022 without biliary abnormalities. Gastric emptying study normal in 2023 and EGD February 2023 consistent with Candida esophagitis and gastritis.  In the ED, the patient was afebrile hemodynamically stable with oxygen saturation 90% on room air.  WBC 16.9, hemoglobin 8.4, platelets 222,000.  Sodium 136, potassium 3.0, bicarbonate 25, serum creatinine 0.92.  AST 47, ALT 18, alk phosphatase 187, total bilirubin 0.6.  CTA abdomen was negative for any contrast extravasation.  Did show focal area of swelling and decreased enhancement in the left kidney with mild fat perinephric stranding.  There was bowel thickening and mucosal enhancement in the ascending and proximal transverse colon. CTA chest was negative for any aortic dissection.  There is moderate GGO bilateral with nodular consolidation. The patient was started on ceftriaxone and metronidazole.   Assessment/Plan:  Severe Sepsis -presented with fever (103.5) and leukocytosis -due to bacteremia, pyelonephritis and PNA -lactic peak 5.0 -check PCT 18.18 -initially on  ceftriaxone, azithro, metronidazole -continue IVF -follow blood culture--ESBL E coli  -  urine culture no done -changed to meropenem for a full 10 day course    E. coli bacteremia -Preliminary multiplex  PCR suggest ESBL -Discontinue ceftriaxone -continue meropenem -Discontinue metronidazole -ID consult requested and recommendation is for 10 day course of meropenem/ertapenem    Acute respiratory failure with hypoxia -Presented with tachypnea and hypoxia with saturation 90% on room air -Secondary to pneumonia in the setting of COPD -Stable on 2 L nasal cannula>>weaned to RA intially -Wean oxygen as tolerated for saturation greater 92% -09/04/22 evening--pt had respiratory distress -09/04/22 CXR--personally reviewed--bilateral interstitial and nodular infiltrates -09/06/22--weaned down to 3L  Acute pulmonary edema -09/04/22 evening--respiratory distress -start IV lasix -BNP 2180 -Echo -accurate I/Os  Intake/Output Summary (Last 24 hours) at 09/07/2022 1843 Last data filed at 09/07/2022 1800 Gross per 24 hour  Intake 240 ml  Output 4100 ml  Net -3860 ml   Filed Weights   09/05/22 1100 09/07/22 0500  Weight: 59.3 kg 55.8 kg    New Cardiomyopathy - Echo with worrisome findings of EF 25-30% with LV RWMAs - will request inpatient cardiology consultation for further recommendation   Lobar pneumonia -initially treated with ceftriaxone and doxycycline until switched to meropenem -Check PCT 18.18 -CTA chest showed bilateral GGO with areas of nodular consolidation;  no dissection -check COVID--neg   Pyelonephritis -Obtain UA--21-50 WBC -Urine culture was not done as ordered -CT abdomen pelvis showed focal area of swelling and hypoenhancement in the left kidney with mild fat stranding and perinephric stranding -Continue meropenem as discussed above for full 10 day course    Colonic wall thickening/colitis -GI consult appreciated -Continue meropenem as discussed above -Patient has follow-up Dr. Marletta Lor -lipase 26 -check C diff--pt having loose stool with abd pain with leukocytosis -stool pathogen panel and cdiff ordered   Chronic abdominal pain -Felt to be multifactorial  including IBS, bowel hypersensitivity, functional bowel syndrome -Judicious opioids -UDS--positive for opiates and benzodiazepines -Continue pantoprazole -controlled with prn opioid medication  -careful with opioids as she became oversedated on morphine -continue bowel regimen (linzess)    Acute anemia -FOBT negative in the ED -Iron saturation 4%, ferritin 31 -Hgb down to 6.6 on 4/23--transfused 2 units PRBC, Hg up to 12.5   Chronic nausea and vomiting -Patient has had extensive workup as discussed above in the history -Continue around-the-clock antiemetic -Continue pantoprazole -emesis is improved, tolerating clears   Chronic constipation Continue Linzess   Anxiety -Continue home dose temazepam -PDMP reviewed -continue fluoxetine -Patient receives temazepam 30 mg, #30 last refill 08/06/2022   Hypokalemia -Repleted -mag 1.8    Hypomagnesemia/hypocalcemia -09/03/2022 corrected calcium 8.0 -replete again on 4/23   Essential Hypertension -increase metoprolol succinate to home dose -hold losartan   Acute metabolic encephalopathy -Secondary to infectious process -09/03/2022--improving      Family Communication:     Consultants:  GI   Code Status:  FULL    DVT Prophylaxis:  [place] Lovenox     Procedures: As Listed in Progress Note Above   Antibiotics: Meropenem 4/22>> Ceftriaxone 4/21>>4/22 Metronidazole 4/21>>4/22  Subjective: Pt reports that she wants more pain medication   Objective: Vitals:   09/07/22 0020 09/07/22 0345 09/07/22 0500 09/07/22 0725  BP: 118/81 127/86    Pulse: 88 92  73  Resp: (!) 24 18  (!) 22  Temp: 98.5 F (36.9 C) (!) 97.5 F (36.4 C)    TempSrc:  Oral    SpO2: 92% 95%  98%  Weight:   55.8 kg     Intake/Output Summary (Last  24 hours) at 09/07/2022 1843 Last data filed at 09/07/2022 1800 Gross per 24 hour  Intake 240 ml  Output 4100 ml  Net -3860 ml   Weight change: -3.5 kg Exam:  General:  Pt appears chronically ill,  awake,  follows commands appropriately, not in acute distress HEENT: No icterus, No thrush, No neck mass, [place]/AT Cardiovascular: normal S1/S2, no rubs, no gallops Respiratory:  no increased work of breathing.  Occasional dry cough; no wheezing heard.  Abdomen: Soft/+BS, mild diffuse tender, non distended, no guarding Extremities: trace LE edema, No lymphangitis, No petechiae, No rashes, no synovitis   Data Reviewed: I have personally reviewed following labs and imaging studies Basic Metabolic Panel: Recent Labs  Lab 09/03/22 0450 09/04/22 0403 09/05/22 0358 09/06/22 0418 09/07/22 0402  NA 139 130* 131* 136 133*  K 3.4* 3.7 3.2* 3.2* 3.5  CL 110 102 98 103 95*  CO2 20* 19* 22 23 28   GLUCOSE 127* 88 93 83 86  BUN 11 10 7 9 6   CREATININE 0.80 0.78 0.81 0.66 0.68  CALCIUM 6.1* 6.5* 7.4* 7.3* 7.4*  MG 1.1* 1.7 1.8  --  1.3*   Liver Function Tests: Recent Labs  Lab 09/03/22 0450 09/04/22 0403 09/05/22 0358 09/06/22 0418 09/07/22 0402  AST 83* 73* 157* 86* 95*  ALT 28 34 61* 46* 51*  ALKPHOS 155* 148* 185* 123 122  BILITOT 0.3 0.5 0.8 0.7 0.8  PROT 6.0* 6.3* 7.4 5.9* 7.0  ALBUMIN 1.6* 1.7* 2.0* 1.6* 1.9*   Recent Labs  Lab 09/02/22 1114  LIPASE 26   No results for input(s): "AMMONIA" in the last 168 hours. Coagulation Profile: No results for input(s): "INR", "PROTIME" in the last 168 hours. CBC: Recent Labs  Lab 09/02/22 1114 09/04/22 0403 09/04/22 0656 09/05/22 0358 09/06/22 0418 09/07/22 0526  WBC 16.9* 17.2*  --  17.8* 17.2* 12.5*  NEUTROABS 15.0*  --   --   --   --   --   HGB 8.4* 6.6* 6.5* 12.4 12.5 10.7*  HCT 28.6* 22.5* 21.6* 37.9 40.1 34.4*  MCV 83.9 84.0  --  82.6 84.1 85.1  PLT 322 237  --  247 281 300   Cardiac Enzymes: No results for input(s): "CKTOTAL", "CKMB", "CKMBINDEX", "TROPONINI" in the last 168 hours. BNP: Invalid input(s): "POCBNP" CBG: No results for input(s): "GLUCAP" in the last 168 hours. HbA1C: No results for input(s):  "HGBA1C" in the last 72 hours. Urine analysis:    Component Value Date/Time   COLORURINE YELLOW 09/02/2022 1754   APPEARANCEUR HAZY (A) 09/02/2022 1754   LABSPEC 1.036 (H) 09/02/2022 1754   PHURINE 5.0 09/02/2022 1754   GLUCOSEU NEGATIVE 09/02/2022 1754   HGBUR SMALL (A) 09/02/2022 1754   BILIRUBINUR NEGATIVE 09/02/2022 1754   KETONESUR NEGATIVE 09/02/2022 1754   PROTEINUR 100 (A) 09/02/2022 1754   UROBILINOGEN 0.2 03/24/2013 1422   NITRITE POSITIVE (A) 09/02/2022 1754   LEUKOCYTESUR SMALL (A) 09/02/2022 1754   Sepsis Labs: @LABRCNTIP (procalcitonin:4,lacticidven:4) ) Recent Results (from the past 240 hour(s))  Culture, blood (routine x 2)     Status: None   Collection Time: 09/02/22  2:57 PM   Specimen: Blood  Result Value Ref Range Status   Specimen Description BOTTLES DRAWN AEROBIC ONLY BLOOD RIGHT HAND  Final   Special Requests Blood Culture adequate volume  Final   Culture   Final    NO GROWTH 5 DAYS Performed at [facility], [address] [place], [place] [address]    Report  Status 09/07/2022 FINAL  Final  Culture, blood (routine x 2)     Status: Abnormal   Collection Time: 09/02/22  2:57 PM   Specimen: Left Antecubital; Blood  Result Value Ref Range Status   Specimen Description   Final    LEFT ANTECUBITAL BOTTLES DRAWN AEROBIC AND ANAEROBIC Performed at [facility], [address], [place], [place] [address]    Special Requests   Final    Blood Culture adequate volume Performed at [facility], [address] [place], [place] [address]    Culture  Setup Time   Final    GRAM NEGATIVE RODS Gram Stain Report Called to,Read Back By and Verified With: HAILE,A @ 0239 ON 09/03/22 BY JUW ANAEROBIC BOTTLE ONLY GS DONE @ [facility] CRITICAL RESULT CALLED TO, READ BACK BY AND VERIFIED WITH: A. HAILE RN 09/03/22 @ 0547 BY AB Performed at [facility], [address], [place], [place] [address]    Culture (A)  Final    ESCHERICHIA COLI Confirmed Extended Spectrum  Beta-Lactamase Producer (ESBL).  In bloodstream infections from ESBL organisms, carbapenems are preferred over piperacillin/tazobactam. They are shown to have a lower risk of mortality.    Report Status 09/05/2022 FINAL  Final   Organism ID, Bacteria ESCHERICHIA COLI  Final      Susceptibility   Escherichia coli - MIC*    AMPICILLIN >=32 RESISTANT Resistant     CEFEPIME 16 RESISTANT Resistant     CEFTAZIDIME RESISTANT Resistant     CEFTRIAXONE >=64 RESISTANT Resistant     CIPROFLOXACIN >=4 RESISTANT Resistant     GENTAMICIN >=16 RESISTANT Resistant     IMIPENEM <=0.25 SENSITIVE Sensitive     TRIMETH/SULFA >=320 RESISTANT Resistant     AMPICILLIN/SULBACTAM >=32 RESISTANT Resistant     PIP/TAZO <=4 SENSITIVE Sensitive     * ESCHERICHIA COLI  Blood Culture ID Panel (Reflexed)     Status: Abnormal   Collection Time: 09/02/22  2:57 PM  Result Value Ref Range Status   Enterococcus faecalis NOT DETECTED NOT DETECTED Final   Enterococcus Faecium NOT DETECTED NOT DETECTED Final   Listeria monocytogenes NOT DETECTED NOT DETECTED Final   Staphylococcus species NOT DETECTED NOT DETECTED Final   Staphylococcus aureus (BCID) NOT DETECTED NOT DETECTED Final   Staphylococcus epidermidis NOT DETECTED NOT DETECTED Final   Staphylococcus lugdunensis NOT DETECTED NOT DETECTED Final   Streptococcus species NOT DETECTED NOT DETECTED Final   Streptococcus agalactiae NOT DETECTED NOT DETECTED Final   Streptococcus pneumoniae NOT DETECTED NOT DETECTED Final   Streptococcus pyogenes NOT DETECTED NOT DETECTED Final   A.calcoaceticus-baumannii NOT DETECTED NOT DETECTED Final   Bacteroides fragilis NOT DETECTED NOT DETECTED Final   Enterobacterales DETECTED (A) NOT DETECTED Final    Comment: Enterobacterales represent a large order of gram negative bacteria, not a single organism. CRITICAL RESULT CALLED TO, READ BACK BY AND VERIFIED WITH: A. HAILE RN 09/03/22 @ 0547 BY AB    Enterobacter cloacae complex NOT  DETECTED NOT DETECTED Final   Escherichia coli DETECTED (A) NOT DETECTED Final    Comment: CRITICAL RESULT CALLED TO, READ BACK BY AND VERIFIED WITH: A. HAILE RN 09/03/22 @ 0547 BY AB    Klebsiella aerogenes NOT DETECTED NOT DETECTED Final   Klebsiella oxytoca NOT DETECTED NOT DETECTED Final   Klebsiella pneumoniae NOT DETECTED NOT DETECTED Final   Proteus species NOT DETECTED NOT DETECTED Final   Salmonella species NOT DETECTED NOT DETECTED Final   Serratia marcescens NOT DETECTED NOT  DETECTED Final   Haemophilus influenzae NOT DETECTED NOT DETECTED Final   Neisseria meningitidis NOT DETECTED NOT DETECTED Final   Pseudomonas aeruginosa NOT DETECTED NOT DETECTED Final   Stenotrophomonas maltophilia NOT DETECTED NOT DETECTED Final   Candida albicans NOT DETECTED NOT DETECTED Final   Candida auris NOT DETECTED NOT DETECTED Final   Candida glabrata NOT DETECTED NOT DETECTED Final   Candida krusei NOT DETECTED NOT DETECTED Final   Candida parapsilosis NOT DETECTED NOT DETECTED Final   Candida tropicalis NOT DETECTED NOT DETECTED Final   Cryptococcus neoformans/gattii NOT DETECTED NOT DETECTED Final   CTX-M ESBL DETECTED (A) NOT DETECTED Final    Comment: CRITICAL RESULT CALLED TO, READ BACK BY AND VERIFIED WITH: A. HAILE RN 09/03/22 @ 0547 BY AB (NOTE) Extended spectrum beta-lactamase detected. Recommend a carbapenem as initial therapy.      Carbapenem resistance IMP NOT DETECTED NOT DETECTED Final   Carbapenem resistance KPC NOT DETECTED NOT DETECTED Final   Carbapenem resistance NDM NOT DETECTED NOT DETECTED Final   Carbapenem resist OXA 48 LIKE NOT DETECTED NOT DETECTED Final   Carbapenem resistance VIM NOT DETECTED NOT DETECTED Final    Comment: Performed at [facility], [address], [place], [place] [address]  Respiratory (~20 pathogens) panel by PCR     Status: None   Collection Time: 09/02/22  3:32 PM   Specimen: Nasopharyngeal Swab; Respiratory  Result Value Ref  Range Status   Adenovirus NOT DETECTED NOT DETECTED Final   Coronavirus 229E NOT DETECTED NOT DETECTED Final    Comment: (NOTE) The Coronavirus on the Respiratory Panel, DOES NOT test for the novel  Coronavirus (2019 nCoV)    Coronavirus HKU1 NOT DETECTED NOT DETECTED Final   Coronavirus NL63 NOT DETECTED NOT DETECTED Final   Coronavirus OC43 NOT DETECTED NOT DETECTED Final   Metapneumovirus NOT DETECTED NOT DETECTED Final   Rhinovirus / Enterovirus NOT DETECTED NOT DETECTED Final   Influenza A NOT DETECTED NOT DETECTED Final   Influenza B NOT DETECTED NOT DETECTED Final   Parainfluenza Virus 1 NOT DETECTED NOT DETECTED Final   Parainfluenza Virus 2 NOT DETECTED NOT DETECTED Final   Parainfluenza Virus 3 NOT DETECTED NOT DETECTED Final   Parainfluenza Virus 4 NOT DETECTED NOT DETECTED Final   Respiratory Syncytial Virus NOT DETECTED NOT DETECTED Final   Bordetella pertussis NOT DETECTED NOT DETECTED Final   Bordetella Parapertussis NOT DETECTED NOT DETECTED Final   Chlamydophila pneumoniae NOT DETECTED NOT DETECTED Final   Mycoplasma pneumoniae NOT DETECTED NOT DETECTED Final    Comment: Performed at [facility], [address], [place], [place] [address]  SARS Coronavirus 2 by RT PCR (hospital order, performed in [facility] hospital lab) *cepheid single result test* Anterior Nasal Swab     Status: None   Collection Time: 09/02/22  3:32 PM   Specimen: Anterior Nasal Swab  Result Value Ref Range Status   SARS Coronavirus 2 by RT PCR NEGATIVE NEGATIVE Final    Comment: (NOTE) SARS-CoV-2 target nucleic acids are NOT DETECTED.  The SARS-CoV-2 RNA is generally detectable in upper and lower respiratory specimens during the acute phase of infection. The lowest concentration of SARS-CoV-2 viral copies this assay can detect is 250 copies / mL. A negative result does not preclude SARS-CoV-2 infection and should not be used as the sole basis for treatment or other patient management  decisions.  A negative result may occur with improper specimen collection / handling, submission of specimen  other than nasopharyngeal swab, presence of viral mutation(s) within the areas targeted by this assay, and inadequate number of viral copies (<250 copies / mL). A negative result must be combined with clinical observations, patient history, and epidemiological information.  Fact Sheet for Patients:   RoadLapTop.co.za  Fact Sheet for Healthcare Providers: http://kim-miller.com/  This test is not yet approved or  cleared by the [place] FDA and has been authorized for detection and/or diagnosis of SARS-CoV-2 by FDA under an Emergency Use Authorization (EUA).  This EUA will remain in effect (meaning this test can be used) for the duration of the COVID-19 declaration under Section 564(b)(1) of the Act, 21 U.S.C. section 360bbb-3(b)(1), unless the authorization is terminated or revoked sooner.  Performed at [facility], [address], [place], [place] [address]   MRSA Next Gen by PCR, Nasal     Status: None   Collection Time: 09/02/22  6:27 PM   Specimen: Nasal Mucosa; Nasal Swab  Result Value Ref Range Status   MRSA by PCR Next Gen NOT DETECTED NOT DETECTED Final    Comment: (NOTE) The GeneXpert MRSA Assay (FDA approved for NASAL specimens only), is one component of a comprehensive MRSA colonization surveillance program. It is not intended to diagnose MRSA infection nor to guide or monitor treatment for MRSA infections. Test performance is not FDA approved in patients less than 40 years old. Performed at [facility], [address], [place], [place] [address]   Gastrointestinal Panel by PCR , Stool     Status: None   Collection Time: 09/06/22  5:55 PM   Specimen: STOOL  Result Value Ref Range Status   Campylobacter species NOT DETECTED NOT DETECTED Final   Plesimonas shigelloides NOT DETECTED NOT DETECTED Final    Salmonella species NOT DETECTED NOT DETECTED Final   Yersinia enterocolitica NOT DETECTED NOT DETECTED Final   Vibrio species NOT DETECTED NOT DETECTED Final   Vibrio cholerae NOT DETECTED NOT DETECTED Final   Enteroaggregative E coli (EAEC) NOT DETECTED NOT DETECTED Final   Enteropathogenic E coli (EPEC) NOT DETECTED NOT DETECTED Final   Enterotoxigenic E coli (ETEC) NOT DETECTED NOT DETECTED Final   Shiga like toxin producing E coli (STEC) NOT DETECTED NOT DETECTED Final   Shigella/Enteroinvasive E coli (EIEC) NOT DETECTED NOT DETECTED Final   Cryptosporidium NOT DETECTED NOT DETECTED Final   Cyclospora cayetanensis NOT DETECTED NOT DETECTED Final   Entamoeba histolytica NOT DETECTED NOT DETECTED Final   Giardia lamblia NOT DETECTED NOT DETECTED Final   Adenovirus F40/41 NOT DETECTED NOT DETECTED Final   Astrovirus NOT DETECTED NOT DETECTED Final   Norovirus GI/GII NOT DETECTED NOT DETECTED Final   Rotavirus A NOT DETECTED NOT DETECTED Final   Sapovirus (I, II, IV, and V) NOT DETECTED NOT DETECTED Final    Comment: Performed at [facility], [address] [place], [place] [address]  C Difficile Quick Screen w PCR reflex     Status: None   Collection Time: 09/06/22  5:55 PM   Specimen: STOOL  Result Value Ref Range Status   C Diff antigen NEGATIVE NEGATIVE Final   C Diff toxin NEGATIVE NEGATIVE Final   C Diff interpretation No C. difficile detected.  Final    Comment: Performed at [facility], [address], [place], [place] [address]     Scheduled Meds:  Chlorhexidine Gluconate Cloth  6 each Topical Daily   enoxaparin (LOVENOX) injection  40 mg Subcutaneous Q24H   FLUoxetine  40 mg Oral  Daily   furosemide  40 mg Intravenous Q12H   ipratropium-albuterol  3 mL Nebulization TID   [START ON 09/08/2022] metoprolol succinate  25 mg Oral Daily   ondansetron (ZOFRAN) IV  4 mg Intravenous Q6H   pantoprazole (PROTONIX) IV  40 mg Intravenous Q12H   potassium chloride  40 mEq  Oral BID   sacubitril-valsartan  1 tablet Oral BID   sodium chloride flush  10-40 mL Intracatheter Q12H   Continuous Infusions:  meropenem (MERREM) IV 1 g (09/07/22 1610)    Procedures/Studies: [place] EKG SITE RITE  Result Date: 09/07/2022 If Site Rite image not attached, placement could not be confirmed due to current cardiac rhythm.  DG CHEST PORT 1 VIEW  Result Date: 09/07/2022 CLINICAL DATA:  Provided history: Heart failure. Pulmonary edema. EXAM: PORTABLE CHEST 1 VIEW COMPARISON:  Chest CT 09/05/2022. Prior chest radiographs 09/04/2022 and earlier. FINDINGS: Mild cardiomegaly. Diffuse patchy airspace disease throughout both lungs, similar to the prior examination of 09/04/2022. No appreciable pleural effusion. No evidence of pneumothorax. No acute osseous abnormality identified. Levocurvature of the thoracic spine. Surgical clips within the right upper quadrant of the abdomen. IMPRESSION: 1. Diffuse patchy airspace disease throughout both lungs, similar to the prior examination of 09/04/2022. Findings may be due to pulmonary edema and/or infection. 2. Mild cardiomegaly. Electronically Signed   By: Jackey Loge D.O.   On: 09/07/2022 08:33   CT CHEST WO CONTRAST  Result Date: 09/05/2022 CLINICAL DATA:  Abnormal chest radiograph EXAM: CT CHEST WITHOUT CONTRAST TECHNIQUE: Multidetector CT imaging of the chest was performed following the standard protocol without IV contrast. RADIATION DOSE REDUCTION: This exam was performed according to the departmental dose-optimization program which includes automated exposure control, adjustment of the mA and/or kV according to patient size and/or use of iterative reconstruction technique. COMPARISON:  CT chest, abdomen and pelvis dated April 21st 2024 FINDINGS: Cardiovascular: Mild cardiomegaly. Trace pericardial effusion, slightly increased in size when compared with the prior exam. Normal caliber thoracic aorta with moderate atherosclerotic disease. Mild  coronary artery calcifications. Mediastinum/Nodes: Esophagus and thyroid are unremarkable. Mildly enlarged mediastinal lymph nodes, unchanged when compared with the prior exam and likely reactive. Reference subcarinal lymph node measuring 1.4 cm in short axis on series 2, image 64. Lungs/Pleura: Central airways are patent. New patchy upper lung predominant ground-glass opacities with associated interlobular septal thickening. New trace right-greater-than-left pleural effusions. Upper Abdomen: Prior cholecystectomy.  Trace perihepatic ascites. Musculoskeletal: No chest wall mass or suspicious bone lesions identified. IMPRESSION: 1. New patchy upper lung predominant ground-glass opacities with associated septal thickening, likely due pulmonary edema or infection. 2. New trace right-greater-than-left pleural effusions. 3. Trace pericardial effusion, slightly increased in size when compared with the prior exam. 4. Aortic Atherosclerosis (ICD10-I70.0). Electronically Signed   By: Allegra Lai M.D.   On: 09/05/2022 15:17   ECHOCARDIOGRAM COMPLETE  Result Date: 09/05/2022    ECHOCARDIOGRAM REPORT   Patient Name:   RIKKI TROSPER Date of Exam: 09/05/2022 Medical Rec #:  [id number]      Height:       61.0 in Accession #:    [id number]     Weight:       120.0 lb Date of Birth:  04-15-1971     BSA:          1.520 m Patient Age:    51 years       BP:           160/107 mmHg Patient Gender: F  HR:           95 bpm. [facility] Procedure: 2D Echo, Cardiac Doppler, Color Doppler and Intracardiac            Opacification Agent Indications:    CHF-Acute Diastolic I50.31  History:        Patient has no prior history of Echocardiogram examinations.                 Sepsis, Signs/Symptoms:Shortness of Breath; Risk                 Factors:Hypertension and Current Smoker.  Sonographer:    Aron Baba Referring Phys: [phone] DAVID TAT  Sonographer Comments: Image acquisition challenging due to patient body  habitus and Image acquisition challenging due to respiratory motion. IMPRESSIONS  1. Left ventricular ejection fraction, by estimation, is 25 to 30%. The left ventricle has severely decreased function. The left ventricle demonstrates regional wall motion abnormalities (see scoring diagram/findings for description). Left ventricular diastolic parameters are consistent with Grade II diastolic dysfunction (pseudonormalization).  2. Right ventricular systolic function is moderately reduced. The right ventricular size is mildly enlarged. There is mildly elevated pulmonary artery systolic pressure.  3. Left atrial size was mild to moderately dilated.  4. A small pericardial effusion is present. The pericardial effusion is circumferential.  5. The mitral valve is normal in structure. No evidence of mitral valve regurgitation. No evidence of mitral stenosis.  6. The aortic valve was not well visualized. Aortic valve regurgitation is not visualized. No aortic stenosis is present.  7. The inferior vena cava is normal in size with <50% respiratory variability, suggesting right atrial pressure of 8 mmHg. Comparison(s): No prior Echocardiogram. FINDINGS  Left Ventricle: Left ventricular ejection fraction, by estimation, is 25 to 30%. The left ventricle has severely decreased function. The left ventricle demonstrates regional wall motion abnormalities. Definity contrast agent was given IV to delineate the left ventricular endocardial borders. The left ventricular internal cavity size was normal in size. There is no left ventricular hypertrophy. Left ventricular diastolic parameters are consistent with Grade II diastolic dysfunction (pseudonormalization).  LV Wall Scoring: The entire anterior wall and entire septum are akinetic. The entire lateral wall and entire inferior wall are hypokinetic. The apex is normal. Right Ventricle: The right ventricular size is mildly enlarged. No increase in right ventricular wall thickness. Right  ventricular systolic function is moderately reduced. There is mildly elevated pulmonary artery systolic pressure. The tricuspid regurgitant velocity is 2.86 m/s, and with an assumed right atrial pressure of 8 mmHg, the estimated right ventricular systolic pressure is 40.7 mmHg. Left Atrium: Left atrial size was mild to moderately dilated. Right Atrium: Right atrial size was normal in size. Pericardium: A small pericardial effusion is present. The pericardial effusion is circumferential. Mitral Valve: The mitral valve is normal in structure. No evidence of mitral valve regurgitation. No evidence of mitral valve stenosis. Tricuspid Valve: The tricuspid valve is normal in structure. Tricuspid valve regurgitation is mild . No evidence of tricuspid stenosis. Aortic Valve: The aortic valve was not well visualized. Aortic valve regurgitation is not visualized. No aortic stenosis is present. Pulmonic Valve: The pulmonic valve was not well visualized. Pulmonic valve regurgitation is mild. No evidence of pulmonic stenosis. Aorta: The aortic root and ascending aorta are structurally normal, with no evidence of dilitation. Venous: The inferior vena cava is normal in size with less than 50% respiratory variability, suggesting right atrial pressure of 8 mmHg. IAS/Shunts: No atrial  level shunt detected by color flow Doppler.  LEFT VENTRICLE PLAX 2D LVIDd:         4.70 cm   Diastology LVIDs:         3.30 cm   LV e' medial:    8.73 cm/s LV PW:         1.10 cm   LV E/e' medial:  14.0 LV IVS:        0.70 cm   LV e' lateral:   7.89 cm/s LVOT diam:     1.70 cm   LV E/e' lateral: 15.5 LV SV:         32 LV SV Index:   21 LVOT Area:     2.27 cm  RIGHT VENTRICLE RV S prime:     7.80 cm/s TAPSE (M-mode): 1.6 cm LEFT ATRIUM             Index        RIGHT ATRIUM           Index LA diam:        3.30 cm 2.17 cm/m   RA Area:     13.60 cm LA Vol (A2C):   56.9 ml 37.43 ml/m  RA Volume:   32.30 ml  21.25 ml/m LA Vol (A4C):   69.1 ml 45.46 ml/m  LA Biplane Vol: 67.1 ml 44.14 ml/m  AORTIC VALVE             PULMONIC VALVE LVOT Vmax:   81.50 cm/s  PR End Diast Vel: 13.62 msec LVOT Vmean:  53.400 cm/s LVOT VTI:    0.139 m  AORTA Ao Root diam: 2.90 cm Ao Asc diam:  3.00 cm MITRAL VALVE                TRICUSPID VALVE MV Area (PHT): 7.16 cm     TR Peak grad:   32.7 mmHg MV Decel Time: 106 msec     TR Vmax:        286.00 cm/s MV E velocity: 122.00 cm/s MV A velocity: 64.90 cm/s   SHUNTS MV E/A ratio:  1.88         Systemic VTI:  0.14 m                             Systemic Diam: 1.70 cm Vishnu Priya Mallipeddi Electronically signed by Winfield Rast Mallipeddi Signature Date/Time: 09/05/2022/12:09:15 PM    Final    DG CHEST PORT 1 VIEW  Result Date: 09/04/2022 CLINICAL DATA:  Respiratory distress EXAM: PORTABLE CHEST 1 VIEW COMPARISON:  Chest radiograph 05/16/2018.  CT chest 09/02/2022 FINDINGS: Heart size and pulmonary vascularity are normal for technique. Diffuse nodular infiltrative pattern throughout the lungs demonstrating progression since previous studies. This could represent atypical infection, pneumoconiosis, or less likely metastatic disease. No pleural effusion. No pneumothorax. Mediastinal contours appear intact. IMPRESSION: Diffuse nodular infiltrative pattern throughout the lungs demonstrating progression since previous studies. Electronically Signed   By: Burman Nieves M.D.   On: 09/04/2022 19:16   CT Angio Chest/Abd/Pel for Dissection W and/or W/WO  Result Date: 09/02/2022 CLINICAL DATA:  Severe abdominal pain with nausea and vomiting. Concern for acute aortic syndrome. EXAM: CT ANGIOGRAPHY CHEST, ABDOMEN AND PELVIS TECHNIQUE: Non-contrast CT of the chest was initially obtained. Multidetector CT imaging through the chest, abdomen and pelvis was performed using the standard protocol during bolus administration of intravenous contrast. Multiplanar reconstructed images and MIPs were obtained and reviewed  to evaluate the vascular anatomy.  RADIATION DOSE REDUCTION: This exam was performed according to the departmental dose-optimization program which includes automated exposure control, adjustment of the mA and/or kV according to patient size and/or use of iterative reconstruction technique. CONTRAST:  OMNIPAQUE IOHEXOL 350 MG/ML SOLN COMPARISON:  CT abdomen pelvis dated 06/05/2022 and chest radiograph dated 05/16/2018. FINDINGS: CTA CHEST FINDINGS Cardiovascular: Preferential opacification of the thoracic aorta. No evidence of thoracic aortic aneurysm, intramural hematoma, or dissection. Heart is mildly enlarged. Trace pericardial effusion. Mediastinum/Nodes: No enlarged mediastinal, hilar, or axillary lymph nodes. Thyroid gland, trachea, and esophagus demonstrate no significant findings. Lungs/Pleura: Moderate bilateral ground-glass opacities with areas of nodular consolidation are noted. Trace bilateral pleural effusions. No pneumothorax. Musculoskeletal: A chronic unfused distal right clavicle fracture versus degenerative changes is noted. No acute osseous injury. Review of the MIP images confirms the above findings. CTA ABDOMEN AND PELVIS FINDINGS VASCULAR Aorta: Atherosclerotic disease without aneurysm, dissection, vasculitis or significant stenosis. Celiac: Atherosclerotic disease without evidence of aneurysm, dissection, vasculitis or significant stenosis. SMA: Patent without evidence of aneurysm, dissection, vasculitis or significant stenosis. Renals: Both renal arteries are patent without evidence of aneurysm, dissection, vasculitis, fibromuscular dysplasia or significant stenosis. IMA: Patent without evidence of aneurysm, dissection, vasculitis or significant stenosis. Inflow: Atherosclerotic disease without evidence of aneurysm, dissection, vasculitis or significant stenosis. Veins: No obvious venous abnormality within the limitations of this arterial phase study. Review of the MIP images confirms the above findings. NON-VASCULAR  Hepatobiliary: No focal liver abnormality is seen. No gallstones, gallbladder wall thickening, or biliary dilatation. Pancreas: Unremarkable. No pancreatic ductal dilatation or surrounding inflammatory changes. Spleen: Normal in size without focal abnormality. Adrenals/Urinary Tract: Adrenal glands are unremarkable. There is a focal area of swelling and decreased enhancement involving the anterior/inferior pole of the left kidney (series 11, images 120 6-28). There is a mild fat stranding and edema in the left perinephric space. Septated cystic lesion in the upper left kidney is unchanged, measuring 3.9 x 2.9 cm. No renal calculi on the left. The right kidney appears normal, without renal calculi, focal lesion, or hydronephrosis. Bladder is unremarkable. Stomach/Bowel: Stomach is within normal limits. There is bowel wall thickening and mucosal hyperenhancement of the ascending colon and proximal transverse colon. The appendix appears normal. No evidence of bowel obstruction. Lymphatic: No abnormally enlarged lymph nodes in the abdomen or pelvis. Reproductive: Status post hysterectomy. No adnexal masses. Other: No abdominal wall hernia or abnormality. No abdominopelvic ascites. Musculoskeletal: No acute or significant osseous findings. Review of the MIP images confirms the above findings. IMPRESSION: 1. Focal area of swelling and decreased enhancement involving the anterior/inferior pole of the left kidney with mild fat stranding and edema in the left perinephric space. Findings are concerning for focal pyelonephritis. 2. Bowel wall thickening and mucosal hyperenhancement of the ascending colon and proximal transverse colon is concerning for colitis. 3. No aortic aneurysm, intramural hematoma, or dissection. 4. Moderate bilateral ground-glass opacities with areas of nodular consolidation and trace bilateral pleural effusions, likely multifocal pneumonia. 5. Mild cardiomegaly and trace pericardial effusion. 6.  Septated cystic structure at the upper pole the left kidney was described as Bosniak 61F on prior MRI of the abdomen from 12/23/2020 with recommendation for 1 year follow-up. Aortic Atherosclerosis (ICD10-I70.0). Electronically Signed   By: Romona Curls M.D.   On: 09/02/2022 13:38    Alonnie Bieker Laural Benes, MD   [facility]  If 7PM-7AM, please contact night-coverage [email or web address] Password [facility] 09/07/2022, 6:43 PM   LOS: 5 days

## 2022-09-07 NOTE — Plan of Care (Signed)
  Problem: Coping: Goal: Level of anxiety will decrease Outcome: Not Progressing   

## 2022-09-07 NOTE — Progress Notes (Signed)
Peripherally Inserted Central Catheter Placement  The IV Nurse has discussed with the patient and/or persons authorized to consent for the patient, the purpose of this procedure and the potential benefits and risks involved with this procedure.  The benefits include less needle sticks, lab draws from the catheter, and the patient may be discharged home with the catheter. Risks include, but not limited to, infection, bleeding, blood clot (thrombus formation), and puncture of an artery; nerve damage and irregular heartbeat and possibility to perform a PICC exchange if needed/ordered by physician.  Alternatives to this procedure were also discussed.  Bard Power PICC patient education guide, fact sheet on infection prevention and patient information card has been provided to patient /or left at bedside.    PICC Placement Documentation  PICC Single Lumen 09/07/22 Right Basilic 34 cm 0 cm (Active)  Indication for Insertion or Continuance of Line Prolonged intravenous therapies 09/07/22 1608  Exposed Catheter (cm) 0 cm 09/07/22 1608  Site Assessment Clean, Dry, Intact 09/07/22 1608  Line Status Flushed;Blood return noted;Saline locked 09/07/22 1608  Dressing Type Transparent 09/07/22 1608  Dressing Status Antimicrobial disc in place 09/07/22 1608  Safety Lock Not Applicable 09/07/22 1608  Line Care Connections checked and tightened 09/07/22 1608  Line Adjustment (NICU/IV Team Only) No 09/07/22 1608  Dressing Intervention New dressing 09/07/22 1608  Dressing Change Due 09/14/22 09/07/22 1608       Audrie Gallus 09/07/2022, 4:10 PM

## 2022-09-07 NOTE — TOC Progression Note (Signed)
Transition of Care [facility]) - Progression Note    Patient Details  Name: [name] MRN: [id number] Date of Birth: 10-31-1970  [facility] CM/SW Contact  Elliot Gault, LCSW Phone Number: 09/07/2022, 2:30 PM  Clinical Narrative:     TOC following. MD anticipating likely dc over the weekend. Updated Pam at [employer] and she is here today to get orders signed and do teaching with pt. Pam arranging the [facility] with [facility].  Weekend TOC will follow and update Pam when dc date known.  Expected Discharge Plan: Home w Home Health Services Barriers to Discharge: Continued Medical Work up  Expected Discharge Plan and Services In-house Referral: Clinical Social Work   Post Acute Care Choice: Home Health Living arrangements for the past 2 months: Single Family Home                             HH Agency: [facility] Date HH Agency Contacted: 09/06/22   Representative spoke with at [facility] Agency: Pam   Social Determinants of Health (SDOH) Interventions SDOH Screenings   Food Insecurity: No Food Insecurity (03/19/2017)  Transportation Needs: No Transportation Needs (03/19/2017)  Depression (PHQ2-9): Low Risk  (11/29/2020)  Financial Resource Strain: Low Risk  (03/19/2017)  Physical Activity: Inactive (03/19/2017)  Social Connections: Somewhat Isolated (03/19/2017)  Stress: Stress Concern Present (03/19/2017)  Tobacco Use: High Risk (09/03/2022)    Readmission Risk Interventions     No data to display

## 2022-09-07 NOTE — Progress Notes (Signed)
Gastroenterology Progress Note   Referring Provider: No ref. provider found Primary Care Physician:  Smith Robert, MD (Inactive) Primary Gastroenterologist:  Dr. Marletta Lor  Patient ID: [name]; [id number]; [birth year]    Subjective   Continues to experience nausea.  Tolerating clear liquids okay.  Denies any vomiting.  No improvement in abdominal pain, remains the same.  Does have a slight worsening postprandially.  States she has had 5-6 bowel movements today that are loose in nature.  Denies any melena but does report dark stool.  No BRBPR.  Continues to have shortness of breath and intermittent chest pain.  Remains on 3 L nasal cannula.   Objective   Vital signs in last 24 hours Temp:  [97.5 F (36.4 C)-98.5 F (36.9 C)] 97.5 F (36.4 C) (04/26 0345) Pulse Rate:  [73-92] 73 (04/26 0725) Resp:  [18-25] 22 (04/26 0725) BP: (118-149)/(78-88) 127/86 (04/26 0345) SpO2:  [92 %-98 %] 98 % (04/26 0725) Weight:  [55.8 kg] 55.8 kg (04/26 0500) Last BM Date : 09/06/22  Physical Exam General:   Alert and oriented, pleasant Head:  Normocephalic and atraumatic. Eyes:  No icterus, sclera clear. Conjuctiva pink.  Mouth:  Without lesions, mucosa pink and moist.  Neck:  Supple, without thyromegaly or masses.  Heart:  S1, S2 present, no murmurs noted.  Lungs: Clear to auscultation bilaterally, without wheezing, rales, or rhonchi.  Abdomen:  Bowel sounds present, soft, non-tender, non-distended. No HSM or hernias noted. No rebound or guarding. No masses appreciated  Msk:  Symmetrical without gross deformities. Normal posture. Pulses:  Normal pulses noted. Extremities:  Without clubbing or edema. Neurologic:  Alert and  oriented x4;  grossly normal neurologically. Skin:  Warm and dry, intact without significant lesions.  Cervical Nodes:  No significant cervical adenopathy. Psych:  Alert and cooperative. Normal mood and affect.  Intake/Output from previous day: 04/25 0701 - 04/26  0700 In: 240 [P.O.:240] Out: 4300 [Urine:4300] Intake/Output this shift: Total I/O In: -  Out: 1000 [Urine:1000]  Lab Results  Recent Labs    09/05/22 0358 09/06/22 0418 09/07/22 0526  WBC 17.8* 17.2* 12.5*  HGB 12.4 12.5 10.7*  HCT 37.9 40.1 34.4*  PLT 247 281 300   BMET Recent Labs    09/05/22 0358 09/06/22 0418 09/07/22 0402  NA 131* 136 133*  K 3.2* 3.2* 3.5  CL 98 103 95*  CO2 22 23 28   GLUCOSE 93 83 86  BUN 7 9 6   CREATININE 0.81 0.66 0.68  CALCIUM 7.4* 7.3* 7.4*   LFT Recent Labs    09/05/22 0358 09/06/22 0418 09/07/22 0402  PROT 7.4 5.9* 7.0  ALBUMIN 2.0* 1.6* 1.9*  AST 157* 86* 95*  ALT 61* 46* 51*  ALKPHOS 185* 123 122  BILITOT 0.8 0.7 0.8   PT/INR No results for input(s): "LABPROT", "INR" in the last 72 hours. Hepatitis Panel No results for input(s): "HEPBSAG", "HCVAB", "HEPAIGM", "HEPBIGM" in the last 72 hours. C-Diff PCR negative  Studies/Results [place] EKG SITE RITE  Result Date: 09/07/2022 If Site Rite image not attached, placement could not be confirmed due to current cardiac rhythm.  DG CHEST PORT 1 VIEW  Result Date: 09/07/2022 CLINICAL DATA:  Provided history: Heart failure. Pulmonary edema. EXAM: PORTABLE CHEST 1 VIEW COMPARISON:  Chest CT 09/05/2022. Prior chest radiographs 09/04/2022 and earlier. FINDINGS: Mild cardiomegaly. Diffuse patchy airspace disease throughout both lungs, similar to the prior examination of 09/04/2022. No appreciable pleural effusion. No evidence of pneumothorax. No acute osseous abnormality identified.  Levocurvature of the thoracic spine. Surgical clips within the right upper quadrant of the abdomen. IMPRESSION: 1. Diffuse patchy airspace disease throughout both lungs, similar to the prior examination of 09/04/2022. Findings may be due to pulmonary edema and/or infection. 2. Mild cardiomegaly. Electronically Signed   By: Jackey Loge D.O.   On: 09/07/2022 08:33   CT CHEST WO CONTRAST  Result Date:  09/05/2022 CLINICAL DATA:  Abnormal chest radiograph EXAM: CT CHEST WITHOUT CONTRAST TECHNIQUE: Multidetector CT imaging of the chest was performed following the standard protocol without IV contrast. RADIATION DOSE REDUCTION: This exam was performed according to the departmental dose-optimization program which includes automated exposure control, adjustment of the mA and/or kV according to patient size and/or use of iterative reconstruction technique. COMPARISON:  CT chest, abdomen and pelvis dated April 21st 2024 FINDINGS: Cardiovascular: Mild cardiomegaly. Trace pericardial effusion, slightly increased in size when compared with the prior exam. Normal caliber thoracic aorta with moderate atherosclerotic disease. Mild coronary artery calcifications. Mediastinum/Nodes: Esophagus and thyroid are unremarkable. Mildly enlarged mediastinal lymph nodes, unchanged when compared with the prior exam and likely reactive. Reference subcarinal lymph node measuring 1.4 cm in short axis on series 2, image 64. Lungs/Pleura: Central airways are patent. New patchy upper lung predominant ground-glass opacities with associated interlobular septal thickening. New trace right-greater-than-left pleural effusions. Upper Abdomen: Prior cholecystectomy.  Trace perihepatic ascites. Musculoskeletal: No chest wall mass or suspicious bone lesions identified. IMPRESSION: 1. New patchy upper lung predominant ground-glass opacities with associated septal thickening, likely due pulmonary edema or infection. 2. New trace right-greater-than-left pleural effusions. 3. Trace pericardial effusion, slightly increased in size when compared with the prior exam. 4. Aortic Atherosclerosis (ICD10-I70.0). Electronically Signed   By: Allegra Lai M.D.   On: 09/05/2022 15:17   ECHOCARDIOGRAM COMPLETE  Result Date: 09/05/2022    ECHOCARDIOGRAM REPORT   Patient Name:   [name] Date of Exam: 09/05/2022 Medical Rec #:  [id number]      Height:        61.0 in Accession #:    [id number]     Weight:       120.0 lb Date of Birth:  1970-06-27     BSA:          1.520 m Patient Age:    52 years       BP:           160/107 mmHg Patient Gender: F              HR:           95 bpm. [facility] Procedure: 2D Echo, Cardiac Doppler, Color Doppler and Intracardiac            Opacification Agent Indications:    CHF-Acute Diastolic I50.31  History:        Patient has no prior history of Echocardiogram examinations.                 Sepsis, Signs/Symptoms:Shortness of Breath; Risk                 Factors:Hypertension and Current Smoker.  Sonographer:    Aron Baba Referring Phys: [phone] DAVID TAT  Sonographer Comments: Image acquisition challenging due to patient body habitus and Image acquisition challenging due to respiratory motion. IMPRESSIONS  1. Left ventricular ejection fraction, by estimation, is 25 to 30%. The left ventricle has severely decreased function. The left ventricle demonstrates regional wall motion abnormalities (see scoring diagram/findings for description). Left  ventricular diastolic parameters are consistent with Grade II diastolic dysfunction (pseudonormalization).  2. Right ventricular systolic function is moderately reduced. The right ventricular size is mildly enlarged. There is mildly elevated pulmonary artery systolic pressure.  3. Left atrial size was mild to moderately dilated.  4. A small pericardial effusion is present. The pericardial effusion is circumferential.  5. The mitral valve is normal in structure. No evidence of mitral valve regurgitation. No evidence of mitral stenosis.  6. The aortic valve was not well visualized. Aortic valve regurgitation is not visualized. No aortic stenosis is present.  7. The inferior vena cava is normal in size with <50% respiratory variability, suggesting right atrial pressure of 8 mmHg. Comparison(s): No prior Echocardiogram. FINDINGS  Left Ventricle: Left ventricular ejection fraction, by  estimation, is 25 to 30%. The left ventricle has severely decreased function. The left ventricle demonstrates regional wall motion abnormalities. Definity contrast agent was given IV to delineate the left ventricular endocardial borders. The left ventricular internal cavity size was normal in size. There is no left ventricular hypertrophy. Left ventricular diastolic parameters are consistent with Grade II diastolic dysfunction (pseudonormalization).  LV Wall Scoring: The entire anterior wall and entire septum are akinetic. The entire lateral wall and entire inferior wall are hypokinetic. The apex is normal. Right Ventricle: The right ventricular size is mildly enlarged. No increase in right ventricular wall thickness. Right ventricular systolic function is moderately reduced. There is mildly elevated pulmonary artery systolic pressure. The tricuspid regurgitant velocity is 2.86 m/s, and with an assumed right atrial pressure of 8 mmHg, the estimated right ventricular systolic pressure is 40.7 mmHg. Left Atrium: Left atrial size was mild to moderately dilated. Right Atrium: Right atrial size was normal in size. Pericardium: A small pericardial effusion is present. The pericardial effusion is circumferential. Mitral Valve: The mitral valve is normal in structure. No evidence of mitral valve regurgitation. No evidence of mitral valve stenosis. Tricuspid Valve: The tricuspid valve is normal in structure. Tricuspid valve regurgitation is mild . No evidence of tricuspid stenosis. Aortic Valve: The aortic valve was not well visualized. Aortic valve regurgitation is not visualized. No aortic stenosis is present. Pulmonic Valve: The pulmonic valve was not well visualized. Pulmonic valve regurgitation is mild. No evidence of pulmonic stenosis. Aorta: The aortic root and ascending aorta are structurally normal, with no evidence of dilitation. Venous: The inferior vena cava is normal in size with less than 50% respiratory  variability, suggesting right atrial pressure of 8 mmHg. IAS/Shunts: No atrial level shunt detected by color flow Doppler.  LEFT VENTRICLE PLAX 2D LVIDd:         4.70 cm   Diastology LVIDs:         3.30 cm   LV e' medial:    8.73 cm/s LV PW:         1.10 cm   LV E/e' medial:  14.0 LV IVS:        0.70 cm   LV e' lateral:   7.89 cm/s LVOT diam:     1.70 cm   LV E/e' lateral: 15.5 LV SV:         32 LV SV Index:   21 LVOT Area:     2.27 cm  RIGHT VENTRICLE RV S prime:     7.80 cm/s TAPSE (M-mode): 1.6 cm LEFT ATRIUM             Index        RIGHT ATRIUM  Index LA diam:        3.30 cm 2.17 cm/m   RA Area:     13.60 cm LA Vol (A2C):   56.9 ml 37.43 ml/m  RA Volume:   32.30 ml  21.25 ml/m LA Vol (A4C):   69.1 ml 45.46 ml/m LA Biplane Vol: 67.1 ml 44.14 ml/m  AORTIC VALVE             PULMONIC VALVE LVOT Vmax:   81.50 cm/s  PR End Diast Vel: 13.62 msec LVOT Vmean:  53.400 cm/s LVOT VTI:    0.139 m  AORTA Ao Root diam: 2.90 cm Ao Asc diam:  3.00 cm MITRAL VALVE                TRICUSPID VALVE MV Area (PHT): 7.16 cm     TR Peak grad:   32.7 mmHg MV Decel Time: 106 msec     TR Vmax:        286.00 cm/s MV E velocity: 122.00 cm/s MV A velocity: 64.90 cm/s   SHUNTS MV E/A ratio:  1.88         Systemic VTI:  0.14 m                             Systemic Diam: 1.70 cm Vishnu Priya Mallipeddi Electronically signed by Winfield Rast Mallipeddi Signature Date/Time: 09/05/2022/12:09:15 PM    Final    DG CHEST PORT 1 VIEW  Result Date: 09/04/2022 CLINICAL DATA:  Respiratory distress EXAM: PORTABLE CHEST 1 VIEW COMPARISON:  Chest radiograph 05/16/2018.  CT chest 09/02/2022 FINDINGS: Heart size and pulmonary vascularity are normal for technique. Diffuse nodular infiltrative pattern throughout the lungs demonstrating progression since previous studies. This could represent atypical infection, pneumoconiosis, or less likely metastatic disease. No pleural effusion. No pneumothorax. Mediastinal contours appear intact.  IMPRESSION: Diffuse nodular infiltrative pattern throughout the lungs demonstrating progression since previous studies. Electronically Signed   By: Burman Nieves M.D.   On: 09/04/2022 19:16   CT Angio Chest/Abd/Pel for Dissection W and/or W/WO  Result Date: 09/02/2022 CLINICAL DATA:  Severe abdominal pain with nausea and vomiting. Concern for acute aortic syndrome. EXAM: CT ANGIOGRAPHY CHEST, ABDOMEN AND PELVIS TECHNIQUE: Non-contrast CT of the chest was initially obtained. Multidetector CT imaging through the chest, abdomen and pelvis was performed using the standard protocol during bolus administration of intravenous contrast. Multiplanar reconstructed images and MIPs were obtained and reviewed to evaluate the vascular anatomy. RADIATION DOSE REDUCTION: This exam was performed according to the departmental dose-optimization program which includes automated exposure control, adjustment of the mA and/or kV according to patient size and/or use of iterative reconstruction technique. CONTRAST:  OMNIPAQUE IOHEXOL 350 MG/ML SOLN COMPARISON:  CT abdomen pelvis dated 06/05/2022 and chest radiograph dated 05/16/2018. FINDINGS: CTA CHEST FINDINGS Cardiovascular: Preferential opacification of the thoracic aorta. No evidence of thoracic aortic aneurysm, intramural hematoma, or dissection. Heart is mildly enlarged. Trace pericardial effusion. Mediastinum/Nodes: No enlarged mediastinal, hilar, or axillary lymph nodes. Thyroid gland, trachea, and esophagus demonstrate no significant findings. Lungs/Pleura: Moderate bilateral ground-glass opacities with areas of nodular consolidation are noted. Trace bilateral pleural effusions. No pneumothorax. Musculoskeletal: A chronic unfused distal right clavicle fracture versus degenerative changes is noted. No acute osseous injury. Review of the MIP images confirms the above findings. CTA ABDOMEN AND PELVIS FINDINGS VASCULAR Aorta: Atherosclerotic disease without aneurysm,  dissection, vasculitis or significant stenosis. Celiac: Atherosclerotic disease without evidence of aneurysm, dissection, vasculitis  or significant stenosis. SMA: Patent without evidence of aneurysm, dissection, vasculitis or significant stenosis. Renals: Both renal arteries are patent without evidence of aneurysm, dissection, vasculitis, fibromuscular dysplasia or significant stenosis. IMA: Patent without evidence of aneurysm, dissection, vasculitis or significant stenosis. Inflow: Atherosclerotic disease without evidence of aneurysm, dissection, vasculitis or significant stenosis. Veins: No obvious venous abnormality within the limitations of this arterial phase study. Review of the MIP images confirms the above findings. NON-VASCULAR Hepatobiliary: No focal liver abnormality is seen. No gallstones, gallbladder wall thickening, or biliary dilatation. Pancreas: Unremarkable. No pancreatic ductal dilatation or surrounding inflammatory changes. Spleen: Normal in size without focal abnormality. Adrenals/Urinary Tract: Adrenal glands are unremarkable. There is a focal area of swelling and decreased enhancement involving the anterior/inferior pole of the left kidney (series 11, images 120 6-28). There is a mild fat stranding and edema in the left perinephric space. Septated cystic lesion in the upper left kidney is unchanged, measuring 3.9 x 2.9 cm. No renal calculi on the left. The right kidney appears normal, without renal calculi, focal lesion, or hydronephrosis. Bladder is unremarkable. Stomach/Bowel: Stomach is within normal limits. There is bowel wall thickening and mucosal hyperenhancement of the ascending colon and proximal transverse colon. The appendix appears normal. No evidence of bowel obstruction. Lymphatic: No abnormally enlarged lymph nodes in the abdomen or pelvis. Reproductive: Status post hysterectomy. No adnexal masses. Other: No abdominal wall hernia or abnormality. No abdominopelvic ascites.  Musculoskeletal: No acute or significant osseous findings. Review of the MIP images confirms the above findings. IMPRESSION: 1. Focal area of swelling and decreased enhancement involving the anterior/inferior pole of the left kidney with mild fat stranding and edema in the left perinephric space. Findings are concerning for focal pyelonephritis. 2. Bowel wall thickening and mucosal hyperenhancement of the ascending colon and proximal transverse colon is concerning for colitis. 3. No aortic aneurysm, intramural hematoma, or dissection. 4. Moderate bilateral ground-glass opacities with areas of nodular consolidation and trace bilateral pleural effusions, likely multifocal pneumonia. 5. Mild cardiomegaly and trace pericardial effusion. 6. Septated cystic structure at the upper pole the left kidney was described as Bosniak 44F on prior MRI of the abdomen from 12/23/2020 with recommendation for 1 year follow-up. Aortic Atherosclerosis (ICD10-I70.0). Electronically Signed   By: Romona Curls M.D.   On: 09/02/2022 13:38    Assessment  52 y.o. female with a history of Candida esophagitis in February 2023, GERD, chronic nausea, constipation, prior episodes of colitis with unrevealing colonoscopy in 2023, chronic abdominal pain who presented to the ED with worsening abdominal pain and was admitted with sepsis in the setting of bacteremia, pyelonephritis, pneumonia, and concern for colitis on CT.  Colitis: CTA on 4/21 with bowel wall thickening, mucosal hyperenhancement of the ascending and proximal transverse colon on IV antibiotics.  Previously with constipation at home for which she takes Linzess for.  Has had loose stools since admission.  Prior colitis episode in April 2022 in January 2024.  Her CT scan in 2022 was concerning for Crohn's colitis.  She underwent colonoscopy in February 2023 with normal colonic biopsies however this was after course of steroids.  Her etiology of recurrent colitis remains unclear at this  time.  Stool studies this admission are negative.  She continues to have loose stools, abdominal pain, and leukocytosis.  Leukocytosis is however multifactorial in the setting of pyelonephritis and pneumonia.  Given her recurrent colitis she should have outpatient colonoscopy however as noted below she is likely not a candidate for procedure and  [place] given her low EF.  Anemia: Hemoglobin 8.4 on admission.  Was stable at 13.2 in January.  Hemoccult negative.  Iron studies with iron 10, saturation 4%, ferritin 31.  Her hemoglobin declined to 6.6 on 4/23.  She received 2 units PRBCs with improvement to 12.5. Hgb 10.7 today.  Patient admitted to 1-2 BC powders daily for headaches prior to admission.  Most of her abdominal pains located to her epigastric region which could be secondary to PUD/gastritis/duodenitis, or her colitis.  Previous plan for EGD for further evaluation of her anemia however she has a current EF of 25-30% with circumferential pericardial effusion and is currently short of breath at rest and with chest pain.  Cardiology is following with her for management of cardiomyopathy and heart failure and starting her on treatment.  Will need to have improvement of her EF prior to performing procedures and likely would not be a candidate in any PN unless she has improvement.  Elevated LFTs: AST 95, ALT 51, albumin 1.9. Could be congestive hepatopathy secondary to her heart failure. Continue to trend.  No evidence of gallbladder wall thickening, biliary dilation, cholelithiasis, or liver abnormality on recent CT angio chest/abd/pelvis  Plan / Recommendations  Continue antibiotics Monitor H/H, transfuse Hgb <7 PPI BID Continue scheduled zofran Avoid NSAIDs Clear liquids Pain medications per hospitalist Consider outpatient EGD and colonoscopy which may need to be performed at [facility] or tertiary center given her low EF.     LOS: 5 days    09/07/2022, 3:23 PM   Brooke Bonito, MSN, FNP-BC,  AGACNP-BC [facility]

## 2022-09-07 NOTE — Progress Notes (Signed)
PHARMACY CONSULT NOTE FOR:  OUTPATIENT  PARENTERAL ANTIBIOTIC THERAPY (OPAT)  Indication: ESBL E coli Bacteremia Regimen: Ertapenem 1 gm every 24 hours  End date: 09/12/22   IV antibiotic discharge orders are pended. To discharging provider:  please sign these orders via discharge navigator,  Select New Orders & click on the button choice - Manage This Unsigned Work.     Thank you for allowing pharmacy to be a part of this patient's care.  Sharin Mons, PharmD, BCPS, BCIDP Infectious Diseases Clinical Pharmacist Phone: [phone] 09/07/2022, 9:17 AM

## 2022-09-07 NOTE — Progress Notes (Signed)
Patients IV both sites red and edematous she c/o pain , removed per her request , attempted to start another IV x 2 and her veins are blowing when insertion you get blood return and then the vein begins to inflate with saline, notiifed Dr. Laural Benes patient received small amount of magnesium before she was unable to tolerate IVF

## 2022-09-07 NOTE — Consult Note (Addendum)
Cardiology Consultation   Patient ID: JESSCIA IMM MRN: [id number]; DOB: [birth year]  Admit date: 09/02/2022 Date of Consult: 09/07/2022  PCP:  Smith Robert, MD (Inactive)    HeartCare Providers Cardiologist:  New to [facility]   Patient Profile:   DAMONI ERKER is a 52 y.o. female with a hx of HTN, IBS, GERD and colitis who is being seen 09/07/2022 for the evaluation of new cardiomyopathy at the request of Dr. Laural Benes.  History of Present Illness:   Ms. Stumpo presented to [facility] ED on 09/02/2022 for evaluation of nausea, vomiting and abdominal pain. She was febrile up to 103.5 admission and CT Abdomen showed a focal area of swelling and decreased enhancement involving the anterior/inferior pole of the left kidney with mild fat stranding and edema concerning for focal pyelonephritis. Noted to have bowel wall thickening and mucosal hyperenhancement of the ascending colon and proximal transverse colon concerning for colitis. CT also mentioned moderate bilateral ground-glass opacities, trace bilateral pleural effusions and mild cardiomegaly with trace pericardial effusion.  Was admitted for management of sepsis in the setting of pyelonephritis and likely PNA (procalcitonin elevated to 18). Was also found to have E. coli bacteremia and was started on Meropenem by ID. She did develop acute respiratory distress on 09/04/2022 and BNP was elevated to 2180. Was started on IV Lasix and an echocardiogram was obtained for further evaluation. This showed a reduced EF of 25 to 30% with wall motion abnormalities noted with the entire anterior wall and anterior septum being akinetic. The entire lateral wall and inferior wall were also hypokinetic. RV function was moderately reduced and also noted to have mildly elevated PASP, a small pericardial effusion and no significant valve abnormalities.  She was receiving IV Lasix 40mg  daily and this was titrated to BID dosing on 4/25. Listed as being +894  mL this admission but -4.0 L yesterday (had received IV fluids on admission). She reported having chest pain, DOE and abdominal pain for 1 week prior to presentation. She also complained of having chest pains for a few years along with orthopnea, PND and bilateral lower EXTR swelling. She reported that her swelling significantly improved after starting IV Lasix.  She did not have any prior ischemia evaluation, no prior heart attacks, no prior PCI/CABG.  She is a current smoker, smokes half pack per day.  Past Medical History:  Diagnosis Date   Chronic abdominal pain    Chronic back pain    Colitis    Constipation    GERD (gastroesophageal reflux disease)    Hypertension    Migraine    Plumbism    blood clot    PONV (postoperative nausea and vomiting)    Renal mass    Uterine cancer [facility])    age 84    Past Surgical History:  Procedure Laterality Date   ABDOMINAL HYSTERECTOMY     BACK SURGERY     BIOPSY  07/07/2021   Procedure: BIOPSY;  Surgeon: Lanelle Bal, DO;  Location: [facility];  Service: Endoscopy;;   CHOLECYSTECTOMY     Complicated by bile leak requiring ERCP with temporary stenting   COLONOSCOPY WITH ESOPHAGOGASTRODUODENOSCOPY (EGD)  07/2009   [facility]: Op notes cannot be seen through care everywhere however pathology showed terminal ileum and random colon biopsies normal.  Stomach biopsy with gastric antral and fundic mucosa with reactive foveolar hyperplasia.  No active gastritis.  Stains negative for H. pylori.   COLONOSCOPY WITH PROPOFOL N/A 07/07/2021  Surgeon: Lanelle Bal, DO;  Non-bleeding internal hemorrhoids, normal TI  biopsied, normal colon biopsied. Pathology with colon and TI biopsies normal.   ESOPHAGEAL BRUSHING  07/07/2021   Procedure: ESOPHAGEAL BRUSHING;  Surgeon: Lanelle Bal, DO;  Location: [facility];  Service: Endoscopy;;   ESOPHAGOGASTRODUODENOSCOPY  06/2007   [facility]: Normal esophagus, gastric mucosal abnormality with erythema,  biopsy showed gastric antral and fundic mucosa with reactive foveolar hyperplasia, no gastritis, no H. pylori.   ESOPHAGOGASTRODUODENOSCOPY (EGD) WITH PROPOFOL N/A 07/07/2021   Surgeon: Lanelle Bal, DO; moderately severe Candida esophagitis, gastritis, biopsies negative for H. pylori, normal examined duodenum.  Prescribed Diflucan and recommended PPI twice daily.   HAND SURGERY       Home Medications:  Prior to Admission medications   Medication Sig Start Date End Date Taking? Authorizing Provider  acetaminophen (TYLENOL) 500 MG tablet Take by mouth.   Yes [provider]  albuterol (VENTOLIN HFA) 108 (90 Base) MCG/ACT inhaler Inhale 2 puffs into the lungs every 4 (four) hours as needed for shortness of breath or wheezing. 04/30/22  Yes [provider]  famotidine (PEPCID) 20 MG tablet Take 20 mg by mouth daily. For break through heart burn   Yes [provider]  FLUoxetine (PROZAC) 40 MG capsule Take 40 mg by mouth daily.   Yes [provider]  hydrOXYzine (ATARAX/VISTARIL) 50 MG tablet Take 50 mg by mouth every 6 (six) hours as needed for anxiety. 09/29/20  Yes [provider]  losartan (COZAAR) 25 MG tablet Take 25 mg by mouth daily. 08/02/22  Yes [provider]  metoprolol succinate (TOPROL-XL) 100 MG 24 hr tablet Take 100 mg by mouth daily. Take with or immediately following a meal.   Yes [provider]  NICODERM CQ 21 MG/24HR patch Place 21 mg onto the skin daily. 08/02/22  Yes [provider]  ondansetron (ZOFRAN-ODT) 8 MG disintegrating tablet TAKE ONE TABLET BY MOUTH EVERY 8 HOURS 08/14/22  Yes Mahon, Courtney L, NP  pantoprazole (PROTONIX) 40 MG tablet TAKE (1) TABLET BY MOUTH TWICE A DAY BEFORE MEALS. (BREAKFAST AND SUPPER) 05/16/22  Yes Mahon, Courtney L, NP  promethazine (PHENERGAN) 25 MG tablet TAKE ONE-HALF TABLET BY MOUTH EVERY 6 HOURS AS NEEDED FOR NAUSEA AND VOMITING Patient taking differently: Take 12.5 mg  by mouth every 6 (six) hours as needed for nausea or vomiting. 07/19/22  Yes Aida Raider, NP  SPIRIVA HANDIHALER 18 MCG inhalation capsule 1 capsule by inhaling the contents of the capsule using the HandiHaler device Inhalation Once a day for 30 days 08/02/22  Yes [provider]  temazepam (RESTORIL) 30 MG capsule Take 30 mg by mouth at bedtime as needed for sleep.   Yes [provider]  VRAYLAR 3 MG capsule Take 3 mg by mouth daily. 07/03/22  Yes [provider]  busPIRone (BUSPAR) 5 MG tablet 1 tablet Orally Once a day Patient not taking: Reported on 09/02/2022 04/17/22   [provider]  dicyclomine (BENTYL) 20 MG tablet TAKE ONE TABLET BY MOUTH THREE TIMES DAILY WITH FOOD (hold for constipation) Patient not taking: Reported on 09/02/2022 08/27/22   Letta Median, PA-C  linaclotide [facility]) 72 MCG capsule Take 1 capsule (72 mcg total) by mouth daily before breakfast. Patient not taking: Reported on 09/02/2022 05/16/22   Aida Raider, NP  venlafaxine (EFFEXOR) 37.5 MG tablet 1 tablet with food Orally Once a day for 7 days Patient not taking: Reported on 09/02/2022  06/21/21   [provider]    Inpatient Medications: Scheduled Meds:  enoxaparin (LOVENOX) injection  40 mg Subcutaneous Q24H   FLUoxetine  40 mg Oral Daily   furosemide  40 mg Intravenous Q12H   ipratropium-albuterol  3 mL Nebulization TID   metoprolol succinate  100 mg Oral Daily   ondansetron (ZOFRAN) IV  4 mg Intravenous Q6H   pantoprazole (PROTONIX) IV  40 mg Intravenous Q12H   potassium chloride  40 mEq Oral BID   Continuous Infusions:  meropenem (MERREM) IV 1 g (09/07/22 0506)   PRN Meds: acetaminophen, albuterol, dextromethorphan, fentaNYL (SUBLIMAZE) injection, hydrOXYzine, nitroGLYCERIN, oxyCODONE, temazepam  Allergies:    Allergies  Allergen Reactions   Buprenorphine Other (See Comments)    Chest pain/ringing in ears and feet swelling   Penicillins Swelling and  Rash        Aripiprazole Other (See Comments)    seizure   Seroquel [Quetiapine] Other (See Comments)    Seizure (08/18/20)   Cefaclor Other (See Comments)    Muscles locked up   Doxycycline Rash   Gabapentin Nausea And Vomiting    dizzy   Ibuprofen Swelling and Rash   Levofloxacin Rash    Itching   Lisinopril Nausea And Vomiting   Macrobid [Nitrofurantoin Monohyd Macro] Rash   Naproxen Swelling and Rash    Social History:   Social History   Socioeconomic History   Marital status: Married    Spouse name: benito   Number of children: 2   Years of education: Not on file   Highest education level: Associate degree: occupational, Scientist, product/process development, or vocational program  Occupational History    Comment: not employed  Tobacco Use   Smoking status: Every Day    Packs/day: 1.00    Years: 20.00    Additional pack years: 0.00    Total pack years: 20.00    Types: Cigarettes, E-cigarettes   Smokeless tobacco: Never  Vaping Use   Vaping Use: Never used  Substance and Sexual Activity   Alcohol use: No   Drug use: No   Sexual activity: Yes    Birth control/protection: Surgical  Other Topics Concern   Not on file  Social History Narrative   Not on file   Social Determinants of Health   Financial Resource Strain: Low Risk  (03/19/2017)   Overall Financial Resource Strain (CARDIA)    Difficulty of Paying Living Expenses: Not very hard  Food Insecurity: No Food Insecurity (03/19/2017)   Hunger Vital Sign    Worried About Running Out of Food in the Last Year: Never true    Ran Out of Food in the Last Year: Never true  Transportation Needs: No Transportation Needs (03/19/2017)   PRAPARE - Administrator, Civil Service (Medical): No    Lack of Transportation (Non-Medical): No  Physical Activity: Inactive (03/19/2017)   Exercise Vital Sign    Days of Exercise per Week: 0 days    Minutes of Exercise per Session: 0 min  Stress: Stress Concern Present (03/19/2017)   [employer] of Occupational Health - Occupational Stress Questionnaire    Feeling of Stress : To some extent  Social Connections: Somewhat Isolated (03/19/2017)   Social Connection and Isolation Panel [NHANES]    Frequency of Communication with Friends and Family: More than three times a week    Frequency of Social Gatherings with Friends and Family: Twice a week    Attends Religious Services: Never    Active Member of  Clubs or Organizations: No    Attends Banker Meetings: Never    Marital Status: Married  Catering manager Violence: Not At Risk (03/19/2017)   Humiliation, Afraid, Rape, and Kick questionnaire    Fear of Current or Ex-Partner: No    Emotionally Abused: No    Physically Abused: No    Sexually Abused: No    Family History:    Family History  Adopted: Yes  Problem Relation Age of Onset   Colon cancer Maternal Grandfather        47s   Colon cancer Maternal Uncle        60s   Cancer Other    Seizures Other    Stroke Other    Diabetes Other      ROS:  Please see the history of present illness.   All other ROS reviewed and negative.     Physical Exam/Data:   Vitals:   09/07/22 0020 09/07/22 0345 09/07/22 0500 09/07/22 0725  BP: 118/81 127/86    Pulse: 88 92  73  Resp: (!) 24 18  (!) 22  Temp: 98.5 F (36.9 C) (!) 97.5 F (36.4 C)    TempSrc:  Oral    SpO2: 92% 95%  98%  Weight:   55.8 kg     Intake/Output Summary (Last 24 hours) at 09/07/2022 0749 Last data filed at 09/07/2022 0700 Gross per 24 hour  Intake 240 ml  Output 4300 ml  Net -4060 ml      09/07/2022    5:00 AM 09/05/2022   11:00 AM 08/23/2022   11:07 AM  Last 3 Weights  Weight (lbs) 123 lb 0.3 oz 130 lb 11.7 oz 120 lb  Weight (kg) 55.8 kg 59.3 kg 54.432 kg     Body mass index is 23.24 kg/m.  General:  Well nourished, well developed, in no acute distress HEENT: normal Neck: JVD is elevated till just below the angle of mandible Vascular: No carotid bruits; Distal pulses 2+  bilaterally Cardiac:  normal S1, S2; RRR; no murmur  Lungs:  clear to auscultation bilaterally, no wheezing, rhonchi or rales  Abd: soft, nontender, no hepatomegaly  Ext: no edema Musculoskeletal:  No deformities, BUE and BLE strength normal and equal Skin: warm and dry  Neuro:  CNs 2-12 intact, no focal abnormalities noted Psych:  Normal affect   EKG:  The EKG was personally reviewed and demonstrates: NSR, HR 97 with RAD and anterior infarct pattern. TWI improved as compared to prior tracing on 09/02/2022. Corrected QTc at 488 ms.    Relevant CV Studies:  Echocardiogram: 09/05/2022 IMPRESSIONS   1. Left ventricular ejection fraction, by estimation, is 25 to 30%. The  left ventricle has severely decreased function. The left ventricle  demonstrates regional wall motion abnormalities (see scoring  diagram/findings for description). Left ventricular  diastolic parameters are consistent with Grade II diastolic dysfunction  (pseudonormalization).   2. Right ventricular systolic function is moderately reduced. The right  ventricular size is mildly enlarged. There is mildly elevated pulmonary  artery systolic pressure.   3. Left atrial size was mild to moderately dilated.   4. A small pericardial effusion is present. The pericardial effusion is  circumferential.   5. The mitral valve is normal in structure. No evidence of mitral valve  regurgitation. No evidence of mitral stenosis.   6. The aortic valve was not well visualized. Aortic valve regurgitation  is not visualized. No aortic stenosis is present.   7. The inferior vena  cava is normal in size with <50% respiratory  variability, suggesting right atrial pressure of 8 mmHg.   Comparison(s): No prior Echocardiogram.   Laboratory Data:  High Sensitivity Troponin:   Recent Labs  Lab 09/04/22 1922 09/04/22 2032  TROPONINIHS 48* 71*     Chemistry Recent Labs  Lab 09/04/22 0403 09/05/22 0358 09/06/22 0418 09/07/22 0402  NA  130* 131* 136 133*  K 3.7 3.2* 3.2* 3.5  CL 102 98 103 95*  CO2 19* 22 23 28   GLUCOSE 88 93 83 86  BUN 10 7 9 6   CREATININE 0.78 0.81 0.66 0.68  CALCIUM 6.5* 7.4* 7.3* 7.4*  MG 1.7 1.8  --  1.3*  GFRNONAA >60 >60 >60 >60  ANIONGAP 9 11 10 10     Recent Labs  Lab 09/05/22 0358 09/06/22 0418 09/07/22 0402  PROT 7.4 5.9* 7.0  ALBUMIN 2.0* 1.6* 1.9*  AST 157* 86* 95*  ALT 61* 46* 51*  ALKPHOS 185* 123 122  BILITOT 0.8 0.7 0.8   Lipids No results for input(s): "CHOL", "TRIG", "HDL", "LABVLDL", "LDLCALC", "CHOLHDL" in the last 168 hours.  Hematology Recent Labs  Lab 09/04/22 0403 09/04/22 0656 09/05/22 0358 09/06/22 0418  WBC 17.2*  --  17.8* 17.2*  RBC 2.68*  --  4.59 4.77  HGB 6.6* 6.5* 12.4 12.5  HCT 22.5* 21.6* 37.9 40.1  MCV 84.0  --  82.6 84.1  MCH 24.6*  --  27.0 26.2  MCHC 29.3*  --  32.7 31.2  RDW 20.8*  --  18.8* 19.1*  PLT 237  --  247 281   Thyroid No results for input(s): "TSH", "FREET4" in the last 168 hours.  BNP Recent Labs  Lab 09/04/22 1922 09/06/22 0418 09/07/22 0402  BNP 2,180.0* 1,744.0* 1,443.0*    DDimer No results for input(s): "DDIMER" in the last 168 hours.   Assessment and Plan:   Patient is a 52 year old F known to have HTN, IBS is currently admitted to hospitalist team for the management of severe sepsis secondary to ESBL septicemia probably from colitis and lobar pneumonia, currently on IV antibiotics. Hospital course was complicated by new onset cardiomyopathy, LVEF 25 to 30% with RWMA for which inpatient cardiology consultation was requested.  # New onset cardiomyopathy, LVEF 25 to 30% with RWMA # Acute systolic and diastolic heart failure exacerbation -Patient is currently admitted to the team with severe sepsis secondary to ESBL septicemia from colitis and also has lobar pneumonia currently on IV antibiotics. The new onset cardiomyopathy could likely be secondary to acute illness however ischemia cannot be completely ruled out due  to risk factors of HTN, chronic longstanding smoking history as well as regional wall motion abnormalities noted on echocardiogram (the entire anterior wall and anteroseptum are akinetic, the anterolateral wall and entire inferior wall are hypokinetic, apex is normal). Will arrange for outpatient cardiology clinic follow-up and then schedule for CTA cardiac. -Continue IV Lasix 40 mg twice daily and can switch to p.o. Lasix 40 mg once daily over the weekend. -Start metoprolol succinate 25 mg once daily -Start Entresto 24-26 mg twice daily (patient was noted to be allergic to lisinopril and in the drug reaction was nausea vomiting, not a true allergic reaction, so it is okay to start Entresto and have close monitoring) -Daily weights, ins and outs and 1.2L fluid restriction -Outpatient cardiology clinic follow-up  # HTN, controlled: As stated above, start GDMT.   I have spent a total of 65 minutes with patient reviewing chart ,  telemetry, EKGs, labs and examining patient as well as establishing an assessment and plan that was discussed with the patient.  > 50% of time was spent in direct patient care.    Signed, Marjo Bicker, MD  09/07/2022, 9:39 AM

## 2022-09-08 DIAGNOSIS — R6521 Severe sepsis with septic shock: Secondary | ICD-10-CM

## 2022-09-08 DIAGNOSIS — R109 Unspecified abdominal pain: Secondary | ICD-10-CM

## 2022-09-08 LAB — CBC
HCT: 34.5 % — ABNORMAL LOW (ref 36.0–46.0)
Hemoglobin: 10.5 g/dL — ABNORMAL LOW (ref 12.0–15.0)
MCH: 26.1 pg (ref 26.0–34.0)
MCHC: 30.4 g/dL (ref 30.0–36.0)
MCV: 85.6 fL (ref 80.0–100.0)
Platelets: 326 10*3/uL (ref 150–400)
RBC: 4.03 MIL/uL (ref 3.87–5.11)
RDW: 19.2 % — ABNORMAL HIGH (ref 11.5–15.5)
WBC: 7.7 10*3/uL (ref 4.0–10.5)
nRBC: 0 % (ref 0.0–0.2)

## 2022-09-08 LAB — COMPREHENSIVE METABOLIC PANEL
ALT: 39 U/L (ref 0–44)
AST: 64 U/L — ABNORMAL HIGH (ref 15–41)
Albumin: 1.6 g/dL — ABNORMAL LOW (ref 3.5–5.0)
Alkaline Phosphatase: 103 U/L (ref 38–126)
Anion gap: 7 (ref 5–15)
BUN: 6 mg/dL (ref 6–20)
CO2: 32 mmol/L (ref 22–32)
Calcium: 7.4 mg/dL — ABNORMAL LOW (ref 8.9–10.3)
Chloride: 97 mmol/L — ABNORMAL LOW (ref 98–111)
Creatinine, Ser: 0.57 mg/dL (ref 0.44–1.00)
GFR, Estimated: >60 mL/min (ref >60)
Glucose, Bld: 112 mg/dL — ABNORMAL HIGH (ref 70–99)
Potassium: 3.3 mmol/L — ABNORMAL LOW (ref 3.5–5.1)
Sodium: 136 mmol/L (ref 135–145)
Total Bilirubin: 0.7 mg/dL (ref 0.3–1.2)
Total Protein: 6.3 g/dL — ABNORMAL LOW (ref 6.5–8.1)

## 2022-09-08 LAB — MAGNESIUM: Magnesium: 1.7 mg/dL (ref 1.7–2.4)

## 2022-09-08 MED ORDER — SODIUM CHLORIDE 0.9 % IV SOLN
1.0000 g | Freq: Three times a day (TID) | INTRAVENOUS | Status: AC
  Administered 2022-09-08 – 2022-09-09 (×3): 1 g via INTRAVENOUS
  Filled 2022-09-08 (×3): qty 20

## 2022-09-08 MED ORDER — FUROSEMIDE 40 MG PO TABS: 40.0000 mg | ORAL_TABLET | Freq: Every day | ORAL | Status: DC

## 2022-09-08 MED ORDER — FUROSEMIDE 40 MG PO TABS
40.0000 mg | ORAL_TABLET | Freq: Every day | ORAL | Status: DC
  Administered 2022-09-09: 40 mg via ORAL
  Filled 2022-09-08: qty 1

## 2022-09-08 MED ORDER — SODIUM CHLORIDE 0.9 % IV SOLN
1.0000 g | INTRAVENOUS | Status: DC
  Administered 2022-09-09: 1000 mg via INTRAVENOUS
  Filled 2022-09-08 (×2): qty 1

## 2022-09-08 MED ORDER — MAGNESIUM SULFATE 4 GM/100ML IV SOLN
4.0000 g | Freq: Once | INTRAVENOUS | Status: AC
  Administered 2022-09-08: 4 g via INTRAVENOUS
  Filled 2022-09-08: qty 100

## 2022-09-08 NOTE — Progress Notes (Signed)
Patient states her abdominal pain is unchanged (rates it at an 8 ) tolerating her liquids.  States she is hungry and wants more to eat.  States her bowels are still somewhat loose. Still with chest pain and some dyspnea.  Vital signs in last 24 hours: Temp:  [98.4 F (36.9 C)-98.5 F (36.9 C)] 98.5 F (36.9 C) (04/27 0453) Pulse Rate:  [82-89] 85 (04/27 0809) Resp:  [19-20] 19 (04/27 0809) BP: (111-121)/(60-80) 112/60 (04/27 0809) SpO2:  [96 %-97 %] 96 % (04/27 0809) Weight:  [61.9 kg] 61.9 kg (04/27 0453) Last BM Date : 09/07/22 General:   Alert,   pleasant and cooperative in NAD Abdomen: Nondistended.  Positive bowel sounds; mild tenderness to palpation diffusely.  No obvious mass.  Intake/Output from previous day: 04/26 0701 - 04/27 0700 In: 240 [P.O.:240] Out: 3900 [Urine:3900] Intake/Output this shift: No intake/output data recorded.  Lab Results: Recent Labs    09/06/22 0418 09/07/22 0526 09/08/22 0410  WBC 17.2* 12.5* 7.7  HGB 12.5 10.7* 10.5*  HCT 40.1 34.4* 34.5*  PLT 281 300 326   BMET Recent Labs    09/06/22 0418 09/07/22 0402 09/08/22 0410  NA 136 133* 136  K 3.2* 3.5 3.3*  CL 103 95* 97*  CO2 23 28 32  GLUCOSE 83 86 112*  BUN 9 6 6   CREATININE 0.66 0.68 0.57  CALCIUM 7.3* 7.4* 7.4*   LFT Recent Labs    09/08/22 0410  PROT 6.3*  ALBUMIN 1.6*  AST 64*  ALT 39  ALKPHOS 103  BILITOT 0.7   PT/INR No results for input(s): "LABPROT", "INR" in the last 72 hours. Hepatitis Panel No results for input(s): "HEPBSAG", "HCVAB", "HEPAIGM", "HEPBIGM" in the last 72 hours. C-Diff Recent Labs    09/06/22 1755  CDIFFTOX NEGATIVE    Studies/Results: [place] EKG SITE RITE  Result Date: 09/07/2022 If Site Rite image not attached, placement could not be confirmed due to current cardiac rhythm.  DG CHEST PORT 1 VIEW  Result Date: 09/07/2022 CLINICAL DATA:  Provided history: Heart failure. Pulmonary edema. EXAM: PORTABLE CHEST 1 VIEW COMPARISON:  Chest CT  09/05/2022. Prior chest radiographs 09/04/2022 and earlier. FINDINGS: Mild cardiomegaly. Diffuse patchy airspace disease throughout both lungs, similar to the prior examination of 09/04/2022. No appreciable pleural effusion. No evidence of pneumothorax. No acute osseous abnormality identified. Levocurvature of the thoracic spine. Surgical clips within the right upper quadrant of the abdomen. IMPRESSION: 1. Diffuse patchy airspace disease throughout both lungs, similar to the prior examination of 09/04/2022. Findings may be due to pulmonary edema and/or infection. 2. Mild cardiomegaly. Electronically Signed   By: Jackey Loge D.O.   On: 09/07/2022 08:33    Impression: 51 year old lady with chronic abdominal pain suggestion of colitis on recent CT admitted to the hospital with E. coli urosepsis.  Stool studies negative.  Cardiology managing congestive heart failure/.  Mild bump in LFTs nonspecific-may well be secondary to right heart failure.  She does not appear at all appear toxic this morning.  Recommendations:  -At this time, continue treatment of urosepsis and management of congestive heart failure -Continue PPI, Zofran as needed. -Eventual EGD and colonoscopy when she is over her acute illness.

## 2022-09-08 NOTE — Evaluation (Signed)
Physical Therapy Evaluation Patient Details Name: [name] MRN: [id number] DOB: [birth year] Today's Date: 09/08/2022  History of Present Illness  [name] is a 51 year old female with a history of hypertension, chronic abdominal pain, IBS, colitis, constipation, chronic nausea presenting with worsening generalized abdominal pain  Last 24 hours prior to admission.  The patient's spouse at the bedside supplements the history.  The patient is a difficult historian.  Notably, the patient's spouse states that the patient has developed chest congestion, coughing, and some dyspnea on exertion over the past week.  She continues to smoke 1 pack/day.  She has not been on any recent antibiotics.  She has had some generalized weakness.  She has had decreased oral intake.  However, in the last 24 hours prior to admission she developed numerous episodes of nausea and vomiting.  There is no hematemesis.  The patient has not had any diarrhea.  There is no hematochezia or melena.  Spouse also relates that the patient has had some symptoms of urinary urgency in the last day to 2 days.  The patient normally takes BC powders 2 times per week for pain.  There is no hematuria or hemoptysis.  There is no hematemesis.  Because of her worsening condition, the patient was brought to emergency department for further evaluation and treatment.    Clinical Impression  Patient limited for functional mobility as stated below secondary to BLE weakness, fatigue and poor standing balance. Patient does require increased time for bed mobility but not assist. She demonstrates good sitting balance and sitting tolerance at EOB. She requires assist and UE support to transfer to standing with unsteadiness upon standing. Completes small range marches at bedside with bilateral UE support and assist for balance. Encouragement provided for ambulation but patient not feeling "ready" for ambulation yet. Patient may require rehab to facilitate  safe return home if mobility does not improve prior to d/c. Patient will benefit from continued physical therapy in hospital and recommended venue below to increase strength, balance, endurance for safe ADLs and gait.        Recommendations for follow up therapy are one component of a multi-disciplinary discharge planning process, led by the attending physician.  Recommendations may be updated based on patient status, additional functional criteria and insurance authorization.  Follow Up Recommendations Can patient physically be transported by private vehicle: Yes     Assistance Recommended at Discharge Intermittent Supervision/Assistance  Patient can return home with the following  A lot of help with walking and/or transfers;A lot of help with bathing/dressing/bathroom;Assistance with cooking/housework;Help with stairs or ramp for entrance    Equipment Recommendations None recommended by PT  Recommendations for Other Services       Functional Status Assessment Patient has had a recent decline in their functional status and demonstrates the ability to make significant improvements in function in a reasonable and predictable amount of time.     Precautions / Restrictions Precautions Precautions: Fall Restrictions Weight Bearing Restrictions: No      Mobility  Bed Mobility Overal bed mobility: Modified Independent             General bed mobility comments: slow, labored    Transfers Overall transfer level: Needs assistance Equipment used: 1 person hand held assist Transfers: Sit to/from Stand Sit to Stand: Min assist           General transfer comment: use of bed rail and HHA to transfer to standing    Ambulation/Gait  Stairs            Wheelchair Mobility    Modified Rankin (Stroke Patients Only)       Balance Overall balance assessment: Needs assistance Sitting-balance support: No upper extremity supported Sitting  balance-Leahy Scale: Fair Sitting balance - Comments: good/fair seated EOB   Standing balance support: Bilateral upper extremity supported Standing balance-Leahy Scale: Poor Standing balance comment: fair/poor with hand on bed rail and HHA                             Pertinent Vitals/Pain Pain Assessment Pain Assessment: Faces Faces Pain Scale: Hurts little more Pain Location: stomach Pain Descriptors / Indicators: Aching Pain Intervention(s): Limited activity within patient's tolerance, Monitored during session, Repositioned    Home Living Family/patient expects to be discharged to:: Private residence Living Arrangements: Spouse/significant other   Type of Home: House Home Access: Stairs to enter Entrance Stairs-Rails: Doctor, general practice of Steps: 1     Home Equipment: None      Prior Function Prior Level of Function : Independent/Modified Independent             Mobility Comments: patient states community ambulation without AD ADLs Comments: independent     Hand Dominance        Extremity/Trunk Assessment   Upper Extremity Assessment Upper Extremity Assessment: Generalized weakness    Lower Extremity Assessment Lower Extremity Assessment: Generalized weakness    Cervical / Trunk Assessment Cervical / Trunk Assessment: Normal  Communication   Communication: No difficulties  Cognition Arousal/Alertness: Awake/alert, Lethargic Behavior During Therapy: WFL for tasks assessed/performed Overall Cognitive Status: Within Functional Limits for tasks assessed                                          General Comments      Exercises General Exercises - Lower Extremity Hip Flexion/Marching: AROM, Both, 10 reps, Standing   Assessment/Plan    PT Assessment Patient needs continued PT services  PT Problem List Decreased strength;Decreased activity tolerance;Decreased balance;Decreased mobility       PT  Treatment Interventions Balance training;DME instruction;Gait training;Neuromuscular re-education;Stair training;Functional mobility training;Patient/family education;Therapeutic activities;Therapeutic exercise    PT Goals (Current goals can be found in the Care Plan section)  Acute Rehab PT Goals Patient Stated Goal: go home PT Goal Formulation: With patient Time For Goal Achievement: 09/22/22 Potential to Achieve Goals: Good    Frequency Min 3X/week     Co-evaluation               AM-PAC PT "6 Clicks" Mobility  Outcome Measure Help needed turning from your back to your side while in a flat bed without using bedrails?: None Help needed moving from lying on your back to sitting on the side of a flat bed without using bedrails?: A Little Help needed moving to and from a bed to a chair (including a wheelchair)?: A Little Help needed standing up from a chair using your arms (e.g., wheelchair or bedside chair)?: A Little Help needed to walk in hospital room?: A Lot Help needed climbing 3-5 steps with a railing? : A Lot 6 Click Score: 17    End of Session Equipment Utilized During Treatment: Oxygen Activity Tolerance: Patient limited by fatigue Patient left: in bed;with call bell/phone within reach;with bed alarm set Nurse Communication: Mobility status PT  Visit Diagnosis: Unsteadiness on feet (R26.81);Other abnormalities of gait and mobility (R26.89);Muscle weakness (generalized) (M62.81)    Time: 1610-9604 PT Time Calculation (min) (ACUTE ONLY): 12 min   Charges:   PT Evaluation $PT Eval Low Complexity: 1 Low PT Treatments $Therapeutic Exercise: 8-22 mins        12:16 PM, 09/08/22 Wyman Songster PT, DPT Physical Therapist at [facility]

## 2022-09-08 NOTE — Progress Notes (Signed)
   09/08/22 0900  ReDS Vest / Clip  Station Marker A  Ruler Value 31  ReDS Value Range < 36  ReDS Actual Value 25

## 2022-09-08 NOTE — Plan of Care (Signed)
  Problem: Acute Rehab PT Goals(only PT should resolve) Goal: Patient Will Transfer Sit To/From Stand Outcome: Progressing Flowsheets (Taken 09/08/2022 1217) Patient will transfer sit to/from stand:  with supervision  with min guard assist Goal: Pt Will Transfer Bed To Chair/Chair To Bed Outcome: Progressing Flowsheets (Taken 09/08/2022 1217) Pt will Transfer Bed to Chair/Chair to Bed:  with supervision  min guard assist Goal: Pt Will Ambulate Outcome: Progressing Flowsheets (Taken 09/08/2022 1217) Pt will Ambulate:  50 feet  with supervision  with min guard assist  with least restrictive assistive device Goal: Pt/caregiver will Perform Home Exercise Program Outcome: Progressing Flowsheets (Taken 09/08/2022 1217) Pt/caregiver will Perform Home Exercise Program:  For increased strengthening  For improved balance  Independently  12:18 PM, 09/08/22 Wyman Songster PT, DPT Physical Therapist at [facility]

## 2022-09-08 NOTE — Progress Notes (Signed)
   09/08/22 0537  Pain Assessment  Pain Scale 0-10  Pain Score 6  POSS Scale ([place] Opioid Sedation Scale)  POSS *See Group Information* 3-INTERVENTION REQUIRED,Unacceptable,Frequently drowsy, arousable, drifts off to sleep during conversation   Pt reported severe pain during the night multiple time, PRN pain medications given. Fentanyl given 2x and Oxycodone given 3x. Pt drowsy and slept most of the night. PRN Fentanyl given and pt requested Oxycodone at the same time because "The Fentanyl by itself is not strong enough." However, pt fell asleep immediately after administration. PRN and scheduled Zofran given per order for reported nausea. Vitals stable. Moisture barrier cream applied to pt's sacrum for dry, peeling, erythematic skin. Pt turned to the side with a pillow placed under her. Pt refused mobility at the beginning of shift and wanted a bed pan. Pt educated on the importance of ambulation and she got up this a.m. to use BSC.

## 2022-09-08 NOTE — Progress Notes (Signed)
PROGRESS NOTE  [name] ZOX:[id number] DOB: [birth year] DOA: 09/02/2022 PCP: Smith Robert, MD (Inactive)  Brief History:  52 year old female with a history of hypertension, chronic abdominal pain, IBS, colitis, constipation, chronic nausea presenting with worsening generalized abdominal pain Last 24 hours prior to admission.  The patient's spouse at the bedside supplements the history.  The patient is a difficult historian.  Notably, the patient's spouse states that the patient has developed chest congestion, coughing, and some dyspnea on exertion over the past week.  She continues to smoke 1 pack/day.  She has not been on any recent antibiotics.  She has had some generalized weakness.  She has had decreased oral intake.  However, in the last 24 hours prior to admission she developed numerous episodes of nausea and vomiting.  There is no hematemesis.  The patient has not had any diarrhea.  There is no hematochezia or melena.  Spouse also relates that the patient has had some symptoms of urinary urgency in the last day to 2 days.  The patient normally takes BC powders 2 times per week for pain.  There is no hematuria or hemoptysis.  There is no hematemesis. Because of her worsening condition, the patient was brought to emergency department for further evaluation and treatment.  The patient has an extensive GI history for her abdominal pain and IBS and colitis.  Notably, in April 2022 the patient was noted to have colonic wall thickening/colitis on CT.  She was  treated empirically for IBD/suspected Crohn's with prednisone and Entocort with follow-up colonoscopy in February 2023 (off all steroids for several months)   normal other than nonbleeding internal hemorrhoids and normal colon biopsies.  More recently, the patient was seen in the emergency department on 05/27/2022 with worsening abdominal pain, persistent vomiting throughout the day. CT A/P showed diffuse colonic wall thickening  extending from distal transverse colon throughout the rectosigmoid junction consistent with inflammatory or infectious colitis. She was given a 5-day course of prednisone. She also had CT angiography 06/05/2022 that showed patent mesenteric vasculature. She was prescribed a 5-day course of Cipro.  She was most recently in the GI clinic on 08/23/2022 for her abdominal pain, IBS, and possible colitis.  It was felt that her chronic abdominal pain was multifactorial including IBS, bowel hypersensitivity syndrome, and functional abdominal pain.  She was instructed to continue dicyclomine and Linzess. Regarding her chronic nausea and vomiting she has had extensive workup withMRI abdomen in 2022 without biliary abnormalities. Gastric emptying study normal in 2023 and EGD February 2023 consistent with Candida esophagitis and gastritis.  In the ED, the patient was afebrile hemodynamically stable with oxygen saturation 90% on room air.  WBC 16.9, hemoglobin 8.4, platelets 222,000.  Sodium 136, potassium 3.0, bicarbonate 25, serum creatinine 0.92.  AST 47, ALT 18, alk phosphatase 187, total bilirubin 0.6.  CTA abdomen was negative for any contrast extravasation.  Did show focal area of swelling and decreased enhancement in the left kidney with mild fat perinephric stranding.  There was bowel thickening and mucosal enhancement in the ascending and proximal transverse colon. CTA chest was negative for any aortic dissection.  There is moderate GGO bilateral with nodular consolidation. The patient was started on ceftriaxone and metronidazole.   Assessment/Plan:  Severe Sepsis due to UTI  -presented with fever (103.5) and leukocytosis -due to bacteremia, pyelonephritis and PNA -lactic peaked at 5.0 -initially on  ceftriaxone, azithro, metronidazole -follow blood culture--ESBL E coli  -  changed to meropenem for a full 10 day course    E. coli bacteremia -Preliminary multiplex PCR suggest ESBL -Discontinue  ceftriaxone -continue meropenem -Discontinue metronidazole -ID consult requested and recommendation is for 10 day course of meropenem/ertapenem    Acute respiratory failure with hypoxia -Presented with tachypnea and hypoxia with saturation 90% on room air -Secondary to pneumonia in the setting of COPD -Stable on 2 L nasal cannula>>weaned to RA -Wean oxygen as tolerated for saturation greater 92% -09/04/22 evening--pt had respiratory distress -09/04/22 CXR--personally reviewed--bilateral interstitial and nodular infiltrates -09/06/22--weaned down to 3L  Acute pulmonary edema -09/04/22 evening--respiratory distress -start IV lasix - transitioned to oral lasix to start 09/09/22  -BNP 2180 -Echo - EF 25-30%  -accurate I/Os  Intake/Output Summary (Last 24 hours) at 09/08/2022 1156 Last data filed at 09/08/2022 0981 Gross per 24 hour  Intake 240 ml  Output 3300 ml  Net -3060 ml   Filed Weights   09/05/22 1100 09/07/22 0500 09/08/22 0453  Weight: 59.3 kg 55.8 kg 61.9 kg    New Cardiomyopathy - Echo with worrisome findings of EF 25-30% with LV RWMAs - will request inpatient cardiology consultation for further recommendation - pt started on GDMT by cardiology service    Lobar pneumonia - TREATED  -initially treated with ceftriaxone and doxycycline until switched to meropenem -Check PCT 18.18 -CTA chest showed bilateral GGO with areas of nodular consolidation;  no dissection -check COVID--neg   Pyelonephritis -Obtain UA--21-50 WBC -Urine culture was not done as ordered -CT abdomen pelvis showed focal area of swelling and hypoenhancement in the left kidney with mild fat stranding and perinephric stranding -Continue meropenem as discussed above for full 10 day course    Colonic wall thickening/colitis -GI consult appreciated -Continue meropenem as discussed above -Patient has follow-up Dr. Marletta Lor -lipase 26 -check C diff--pt initially did have loose stool with abd pain with  leukocytosis -stool pathogen panel and cdiff ordered - negative so far, waiting on final results   Chronic abdominal pain -Felt to be multifactorial including IBS, bowel hypersensitivity, functional bowel syndrome -Judicious opioids -UDS--positive for opiates and benzodiazepines -Continue pantoprazole -controlled with prn opioid medication  -careful with opioids as she became oversedated on morphine -continue bowel regimen (linzess)   Drug Seeking Behavior - long discussion with patient at length today that we are not increasing IV opioid and we are de-escalating in anticipation of going home, she continues to want higher doses of IV opioid but becomes obtunded with even small doses of IV fentanyl, she has been requesting nursing to give oral and IV at the same time, we have found nothing to account for her to require this much pain medication and I told her today that we are not escalating pain medication.  I would normally have used ketoralac but she is claiming allergy to ibuprofen/NSAIDs and she was already on IV opioid when I picked up her case.  We are planning to send home on 4/28 and with her having a PICC line and need to continue IV antibiotics thru 5/1 I have counseled her on why we are de-escalating and not increasing IV pain meds. She verbalized understanding.     Acute anemia -FOBT negative in the ED -Iron saturation 4%, ferritin 31 -Hgb down to 6.6 on 4/23--transfused 2 units PRBC, Hg up to 12.5   Chronic nausea and vomiting -Patient has had extensive workup as discussed above in the history -Continue around-the-clock antiemetic -Continue pantoprazole -emesis is improved, tolerating clears   Chronic constipation  Continue Linzess   Anxiety -Continue home dose temazepam -PDMP reviewed -continue fluoxetine -Patient receives temazepam 30 mg, #30 last refill 08/06/2022   Hypokalemia -Repleted -mag 1.8 -continue potassium supplement with oral lasix     Hypomagnesemia/hypocalcemia -09/03/2022 corrected calcium 8.0 -repleted    Essential Hypertension -Pt started on GDMT by cardiology -continue metoprol XL 25 mg daily, entresto BID, Lasix    Acute metabolic encephalopathy - RESOLVED  -Secondary to infectious process -pt back to baseline mentation    Family Communication:     Consultants:  GI   Code Status:  FULL    DVT Prophylaxis:  Carl Lovenox     Procedures: As Listed in Progress Note Above   Antibiotics: Meropenem 4/22>>10 day course thru 5/1 Ceftriaxone 4/21>>4/22 Metronidazole 4/21>>4/22  Subjective: Pt continues asking for more pain medication IV only, now requesting dilaudid or morphine because it "lasts longer" She is tolerating diet well and willing to try soft foods. Having bowel movements.   Objective: Vitals:   09/07/22 1933 09/08/22 0453 09/08/22 0742 09/08/22 0809  BP: 111/69 121/80  112/60  Pulse: 89 82  85  Resp: 20 20  19   Temp: 98.4 F (36.9 C) 98.5 F (36.9 C)    TempSrc: Oral Oral    SpO2: 97% 97% 97% 96%  Weight:  61.9 kg      Intake/Output Summary (Last 24 hours) at 09/08/2022 1156 Last data filed at 09/08/2022 1610 Gross per 24 hour  Intake 240 ml  Output 3300 ml  Net -3060 ml   Weight change: 6.1 kg Exam:  General:  Pt appears chronically ill, awake,  follows commands appropriately, not in acute distress HEENT: No icterus, No thrush, No neck mass, [place]/AT Cardiovascular: normal S1/S2, no rubs, no gallops Respiratory:  no increased work of breathing.  Occasional dry cough; no wheezing heard.  Abdomen: Soft/+BS, mild diffuse tender, non distended, no guarding Extremities: trace LE edema, No lymphangitis, No petechiae, No rashes, no synovitis   Data Reviewed: I have personally reviewed following labs and imaging studies Basic Metabolic Panel: Recent Labs  Lab 09/03/22 0450 09/04/22 0403 09/05/22 0358 09/06/22 0418 09/07/22 0402 09/08/22 0410  NA 139 130* 131* 136 133* 136  K  3.4* 3.7 3.2* 3.2* 3.5 3.3*  CL 110 102 98 103 95* 97*  CO2 20* 19* 22 23 28  32  GLUCOSE 127* 88 93 83 86 112*  BUN 11 10 7 9 6 6   CREATININE 0.80 0.78 0.81 0.66 0.68 0.57  CALCIUM 6.1* 6.5* 7.4* 7.3* 7.4* 7.4*  MG 1.1* 1.7 1.8  --  1.3* 1.7   Liver Function Tests: Recent Labs  Lab 09/04/22 0403 09/05/22 0358 09/06/22 0418 09/07/22 0402 09/08/22 0410  AST 73* 157* 86* 95* 64*  ALT 34 61* 46* 51* 39  ALKPHOS 148* 185* 123 122 103  BILITOT 0.5 0.8 0.7 0.8 0.7  PROT 6.3* 7.4 5.9* 7.0 6.3*  ALBUMIN 1.7* 2.0* 1.6* 1.9* 1.6*   Recent Labs  Lab 09/02/22 1114  LIPASE 26   No results for input(s): "AMMONIA" in the last 168 hours. Coagulation Profile: No results for input(s): "INR", "PROTIME" in the last 168 hours. CBC: Recent Labs  Lab 09/02/22 1114 09/04/22 0403 09/04/22 0656 09/05/22 0358 09/06/22 0418 09/07/22 0526 09/08/22 0410  WBC 16.9* 17.2*  --  17.8* 17.2* 12.5* 7.7  NEUTROABS 15.0*  --   --   --   --   --   --   HGB 8.4* 6.6* 6.5* 12.4 12.5 10.7*  10.5*  HCT 28.6* 22.5* 21.6* 37.9 40.1 34.4* 34.5*  MCV 83.9 84.0  --  82.6 84.1 85.1 85.6  PLT 322 237  --  247 281 300 326   Cardiac Enzymes: No results for input(s): "CKTOTAL", "CKMB", "CKMBINDEX", "TROPONINI" in the last 168 hours. BNP: Invalid input(s): "POCBNP" CBG: No results for input(s): "GLUCAP" in the last 168 hours. HbA1C: No results for input(s): "HGBA1C" in the last 72 hours. Urine analysis:    Component Value Date/Time   COLORURINE YELLOW 09/02/2022 1754   APPEARANCEUR HAZY (A) 09/02/2022 1754   LABSPEC 1.036 (H) 09/02/2022 1754   PHURINE 5.0 09/02/2022 1754   GLUCOSEU NEGATIVE 09/02/2022 1754   HGBUR SMALL (A) 09/02/2022 1754   BILIRUBINUR NEGATIVE 09/02/2022 1754   KETONESUR NEGATIVE 09/02/2022 1754   PROTEINUR 100 (A) 09/02/2022 1754   UROBILINOGEN 0.2 03/24/2013 1422   NITRITE POSITIVE (A) 09/02/2022 1754   LEUKOCYTESUR SMALL (A) 09/02/2022 1754   Sepsis  Labs: @LABRCNTIP (procalcitonin:4,lacticidven:4) ) Recent Results (from the past 240 hour(s))  Culture, blood (routine x 2)     Status: None   Collection Time: 09/02/22  2:57 PM   Specimen: Blood  Result Value Ref Range Status   Specimen Description BOTTLES DRAWN AEROBIC ONLY BLOOD RIGHT HAND  Final   Special Requests Blood Culture adequate volume  Final   Culture   Final    NO GROWTH 5 DAYS Performed at [facility], [address] [place], [place] [address]    Report Status 09/07/2022 FINAL  Final  Culture, blood (routine x 2)     Status: Abnormal   Collection Time: 09/02/22  2:57 PM   Specimen: Left Antecubital; Blood  Result Value Ref Range Status   Specimen Description   Final    LEFT ANTECUBITAL BOTTLES DRAWN AEROBIC AND ANAEROBIC Performed at [facility], [address] [place], [place] [address]    Special Requests   Final    Blood Culture adequate volume Performed at [facility], [address], [place], [place] [address]    Culture  Setup Time   Final    GRAM NEGATIVE RODS Gram Stain Report Called to,Read Back By and Verified With: HAILE,A @ 0239 ON 09/03/22 BY JUW ANAEROBIC BOTTLE ONLY GS DONE @ [facility] CRITICAL RESULT CALLED TO, READ BACK BY AND VERIFIED WITH: A. HAILE RN 09/03/22 @ 0547 BY AB Performed at [facility], [address], [place], [place] [address]    Culture (A)  Final    ESCHERICHIA COLI Confirmed Extended Spectrum Beta-Lactamase Producer (ESBL).  In bloodstream infections from ESBL organisms, carbapenems are preferred over piperacillin/tazobactam. They are shown to have a lower risk of mortality.    Report Status 09/05/2022 FINAL  Final   Organism ID, Bacteria ESCHERICHIA COLI  Final      Susceptibility   Escherichia coli - MIC*    AMPICILLIN >=32 RESISTANT Resistant     CEFEPIME 16 RESISTANT Resistant     CEFTAZIDIME RESISTANT Resistant     CEFTRIAXONE >=64 RESISTANT Resistant     CIPROFLOXACIN >=4 RESISTANT Resistant     GENTAMICIN >=16  RESISTANT Resistant     IMIPENEM <=0.25 SENSITIVE Sensitive     TRIMETH/SULFA >=320 RESISTANT Resistant     AMPICILLIN/SULBACTAM >=32 RESISTANT Resistant     PIP/TAZO <=4 SENSITIVE Sensitive     * ESCHERICHIA COLI  Blood Culture ID Panel (Reflexed)     Status: Abnormal   Collection Time: 09/02/22  2:57 PM  Result Value Ref Range Status  Enterococcus faecalis NOT DETECTED NOT DETECTED Final   Enterococcus Faecium NOT DETECTED NOT DETECTED Final   Listeria monocytogenes NOT DETECTED NOT DETECTED Final   Staphylococcus species NOT DETECTED NOT DETECTED Final   Staphylococcus aureus (BCID) NOT DETECTED NOT DETECTED Final   Staphylococcus epidermidis NOT DETECTED NOT DETECTED Final   Staphylococcus lugdunensis NOT DETECTED NOT DETECTED Final   Streptococcus species NOT DETECTED NOT DETECTED Final   Streptococcus agalactiae NOT DETECTED NOT DETECTED Final   Streptococcus pneumoniae NOT DETECTED NOT DETECTED Final   Streptococcus pyogenes NOT DETECTED NOT DETECTED Final   A.calcoaceticus-baumannii NOT DETECTED NOT DETECTED Final   Bacteroides fragilis NOT DETECTED NOT DETECTED Final   Enterobacterales DETECTED (A) NOT DETECTED Final    Comment: Enterobacterales represent a large order of gram negative bacteria, not a single organism. CRITICAL RESULT CALLED TO, READ BACK BY AND VERIFIED WITH: A. HAILE RN 09/03/22 @ 0547 BY AB    Enterobacter cloacae complex NOT DETECTED NOT DETECTED Final   Escherichia coli DETECTED (A) NOT DETECTED Final    Comment: CRITICAL RESULT CALLED TO, READ BACK BY AND VERIFIED WITH: A. HAILE RN 09/03/22 @ 0547 BY AB    Klebsiella aerogenes NOT DETECTED NOT DETECTED Final   Klebsiella oxytoca NOT DETECTED NOT DETECTED Final   Klebsiella pneumoniae NOT DETECTED NOT DETECTED Final   Proteus species NOT DETECTED NOT DETECTED Final   Salmonella species NOT DETECTED NOT DETECTED Final   Serratia marcescens NOT DETECTED NOT DETECTED Final   Haemophilus influenzae NOT  DETECTED NOT DETECTED Final   Neisseria meningitidis NOT DETECTED NOT DETECTED Final   Pseudomonas aeruginosa NOT DETECTED NOT DETECTED Final   Stenotrophomonas maltophilia NOT DETECTED NOT DETECTED Final   Candida albicans NOT DETECTED NOT DETECTED Final   Candida auris NOT DETECTED NOT DETECTED Final   Candida glabrata NOT DETECTED NOT DETECTED Final   Candida krusei NOT DETECTED NOT DETECTED Final   Candida parapsilosis NOT DETECTED NOT DETECTED Final   Candida tropicalis NOT DETECTED NOT DETECTED Final   Cryptococcus neoformans/gattii NOT DETECTED NOT DETECTED Final   CTX-M ESBL DETECTED (A) NOT DETECTED Final    Comment: CRITICAL RESULT CALLED TO, READ BACK BY AND VERIFIED WITH: A. HAILE RN 09/03/22 @ 0547 BY AB (NOTE) Extended spectrum beta-lactamase detected. Recommend a carbapenem as initial therapy.      Carbapenem resistance IMP NOT DETECTED NOT DETECTED Final   Carbapenem resistance KPC NOT DETECTED NOT DETECTED Final   Carbapenem resistance NDM NOT DETECTED NOT DETECTED Final   Carbapenem resist OXA 48 LIKE NOT DETECTED NOT DETECTED Final   Carbapenem resistance VIM NOT DETECTED NOT DETECTED Final    Comment: Performed at [facility], [address], [place], [place] [address]  Respiratory (~20 pathogens) panel by PCR     Status: None   Collection Time: 09/02/22  3:32 PM   Specimen: Nasopharyngeal Swab; Respiratory  Result Value Ref Range Status   Adenovirus NOT DETECTED NOT DETECTED Final   Coronavirus 229E NOT DETECTED NOT DETECTED Final    Comment: (NOTE) The Coronavirus on the Respiratory Panel, DOES NOT test for the novel  Coronavirus (2019 nCoV)    Coronavirus HKU1 NOT DETECTED NOT DETECTED Final   Coronavirus NL63 NOT DETECTED NOT DETECTED Final   Coronavirus OC43 NOT DETECTED NOT DETECTED Final   Metapneumovirus NOT DETECTED NOT DETECTED Final   Rhinovirus / Enterovirus NOT DETECTED NOT DETECTED Final   Influenza A NOT DETECTED NOT DETECTED Final    Influenza B  NOT DETECTED NOT DETECTED Final   Parainfluenza Virus 1 NOT DETECTED NOT DETECTED Final   Parainfluenza Virus 2 NOT DETECTED NOT DETECTED Final   Parainfluenza Virus 3 NOT DETECTED NOT DETECTED Final   Parainfluenza Virus 4 NOT DETECTED NOT DETECTED Final   Respiratory Syncytial Virus NOT DETECTED NOT DETECTED Final   Bordetella pertussis NOT DETECTED NOT DETECTED Final   Bordetella Parapertussis NOT DETECTED NOT DETECTED Final   Chlamydophila pneumoniae NOT DETECTED NOT DETECTED Final   Mycoplasma pneumoniae NOT DETECTED NOT DETECTED Final    Comment: Performed at [facility], [address], [place], [place] [address]  SARS Coronavirus 2 by RT PCR (hospital order, performed in [facility] hospital lab) *cepheid single result test* Anterior Nasal Swab     Status: None   Collection Time: 09/02/22  3:32 PM   Specimen: Anterior Nasal Swab  Result Value Ref Range Status   SARS Coronavirus 2 by RT PCR NEGATIVE NEGATIVE Final    Comment: (NOTE) SARS-CoV-2 target nucleic acids are NOT DETECTED.  The SARS-CoV-2 RNA is generally detectable in upper and lower respiratory specimens during the acute phase of infection. The lowest concentration of SARS-CoV-2 viral copies this assay can detect is 250 copies / mL. A negative result does not preclude SARS-CoV-2 infection and should not be used as the sole basis for treatment or other patient management decisions.  A negative result may occur with improper specimen collection / handling, submission of specimen other than nasopharyngeal swab, presence of viral mutation(s) within the areas targeted by this assay, and inadequate number of viral copies (<250 copies / mL). A negative result must be combined with clinical observations, patient history, and epidemiological information.  Fact Sheet for Patients:   RoadLapTop.co.za  Fact Sheet for Healthcare  Providers: http://kim-miller.com/  This test is not yet approved or  cleared by the [place] FDA and has been authorized for detection and/or diagnosis of SARS-CoV-2 by FDA under an Emergency Use Authorization (EUA).  This EUA will remain in effect (meaning this test can be used) for the duration of the COVID-19 declaration under Section 564(b)(1) of the Act, 21 U.S.C. section 360bbb-3(b)(1), unless the authorization is terminated or revoked sooner.  Performed at [facility], [address] [place], [place] [address]   MRSA Next Gen by PCR, Nasal     Status: None   Collection Time: 09/02/22  6:27 PM   Specimen: Nasal Mucosa; Nasal Swab  Result Value Ref Range Status   MRSA by PCR Next Gen NOT DETECTED NOT DETECTED Final    Comment: (NOTE) The GeneXpert MRSA Assay (FDA approved for NASAL specimens only), is one component of a comprehensive MRSA colonization surveillance program. It is not intended to diagnose MRSA infection nor to guide or monitor treatment for MRSA infections. Test performance is not FDA approved in patients less than 41 years old. Performed at [facility], [address], [place], [place] [address]   Gastrointestinal Panel by PCR , Stool     Status: None   Collection Time: 09/06/22  5:55 PM   Specimen: STOOL  Result Value Ref Range Status   Campylobacter species NOT DETECTED NOT DETECTED Final   Plesimonas shigelloides NOT DETECTED NOT DETECTED Final   Salmonella species NOT DETECTED NOT DETECTED Final   Yersinia enterocolitica NOT DETECTED NOT DETECTED Final   Vibrio species NOT DETECTED NOT DETECTED Final   Vibrio cholerae NOT DETECTED NOT DETECTED Final   Enteroaggregative E coli (EAEC) NOT DETECTED NOT DETECTED  Final   Enteropathogenic E coli (EPEC) NOT DETECTED NOT DETECTED Final   Enterotoxigenic E coli (ETEC) NOT DETECTED NOT DETECTED Final   Shiga like toxin producing E coli (STEC) NOT DETECTED NOT DETECTED Final    Shigella/Enteroinvasive E coli (EIEC) NOT DETECTED NOT DETECTED Final   Cryptosporidium NOT DETECTED NOT DETECTED Final   Cyclospora cayetanensis NOT DETECTED NOT DETECTED Final   Entamoeba histolytica NOT DETECTED NOT DETECTED Final   Giardia lamblia NOT DETECTED NOT DETECTED Final   Adenovirus F40/41 NOT DETECTED NOT DETECTED Final   Astrovirus NOT DETECTED NOT DETECTED Final   Norovirus GI/GII NOT DETECTED NOT DETECTED Final   Rotavirus A NOT DETECTED NOT DETECTED Final   Sapovirus (I, II, IV, and V) NOT DETECTED NOT DETECTED Final    Comment: Performed at [facility], [address] [place], [place] [address]  C Difficile Quick Screen w PCR reflex     Status: None   Collection Time: 09/06/22  5:55 PM   Specimen: STOOL  Result Value Ref Range Status   C Diff antigen NEGATIVE NEGATIVE Final   C Diff toxin NEGATIVE NEGATIVE Final   C Diff interpretation No C. difficile detected.  Final    Comment: Performed at [facility], [address] [place], [place] [address]     Scheduled Meds:  Chlorhexidine Gluconate Cloth  6 each Topical Daily   enoxaparin (LOVENOX) injection  40 mg Subcutaneous Q24H   FLUoxetine  40 mg Oral Daily   [START ON 09/09/2022] furosemide  40 mg Oral Daily   ipratropium-albuterol  3 mL Nebulization TID   metoprolol succinate  25 mg Oral Daily   ondansetron (ZOFRAN) IV  4 mg Intravenous Q6H   pantoprazole (PROTONIX) IV  40 mg Intravenous Q12H   potassium chloride  40 mEq Oral BID   sacubitril-valsartan  1 tablet Oral BID   sodium chloride flush  10-40 mL Intracatheter Q12H   Continuous Infusions:  [START ON 09/09/2022] ertapenem     magnesium sulfate bolus IVPB 4 g (09/08/22 1048)   meropenem (MERREM) IV      Procedures/Studies: [place] EKG SITE RITE  Result Date: 09/07/2022 If Site Rite image not attached, placement could not be confirmed due to current cardiac rhythm.  DG CHEST PORT 1 VIEW  Result Date: 09/07/2022 CLINICAL DATA:  Provided  history: Heart failure. Pulmonary edema. EXAM: PORTABLE CHEST 1 VIEW COMPARISON:  Chest CT 09/05/2022. Prior chest radiographs 09/04/2022 and earlier. FINDINGS: Mild cardiomegaly. Diffuse patchy airspace disease throughout both lungs, similar to the prior examination of 09/04/2022. No appreciable pleural effusion. No evidence of pneumothorax. No acute osseous abnormality identified. Levocurvature of the thoracic spine. Surgical clips within the right upper quadrant of the abdomen. IMPRESSION: 1. Diffuse patchy airspace disease throughout both lungs, similar to the prior examination of 09/04/2022. Findings may be due to pulmonary edema and/or infection. 2. Mild cardiomegaly. Electronically Signed   By: Jackey Loge D.O.   On: 09/07/2022 08:33   CT CHEST WO CONTRAST  Result Date: 09/05/2022 CLINICAL DATA:  Abnormal chest radiograph EXAM: CT CHEST WITHOUT CONTRAST TECHNIQUE: Multidetector CT imaging of the chest was performed following the standard protocol without IV contrast. RADIATION DOSE REDUCTION: This exam was performed according to the departmental dose-optimization program which includes automated exposure control, adjustment of the mA and/or kV according to patient size and/or use of iterative reconstruction technique. COMPARISON:  CT chest, abdomen and pelvis dated April 21st 2024 FINDINGS: Cardiovascular: Mild cardiomegaly. Trace pericardial effusion, slightly increased  in size when compared with the prior exam. Normal caliber thoracic aorta with moderate atherosclerotic disease. Mild coronary artery calcifications. Mediastinum/Nodes: Esophagus and thyroid are unremarkable. Mildly enlarged mediastinal lymph nodes, unchanged when compared with the prior exam and likely reactive. Reference subcarinal lymph node measuring 1.4 cm in short axis on series 2, image 64. Lungs/Pleura: Central airways are patent. New patchy upper lung predominant ground-glass opacities with associated interlobular septal  thickening. New trace right-greater-than-left pleural effusions. Upper Abdomen: Prior cholecystectomy.  Trace perihepatic ascites. Musculoskeletal: No chest wall mass or suspicious bone lesions identified. IMPRESSION: 1. New patchy upper lung predominant ground-glass opacities with associated septal thickening, likely due pulmonary edema or infection. 2. New trace right-greater-than-left pleural effusions. 3. Trace pericardial effusion, slightly increased in size when compared with the prior exam. 4. Aortic Atherosclerosis (ICD10-I70.0). Electronically Signed   By: Allegra Lai M.D.   On: 09/05/2022 15:17   ECHOCARDIOGRAM COMPLETE  Result Date: 09/05/2022    ECHOCARDIOGRAM REPORT   Patient Name:   [name] Date of Exam: 09/05/2022 Medical Rec #:  [id number]      Height:       61.0 in Accession #:    [id number]     Weight:       120.0 lb Date of Birth:  [birth year]     BSA:          1.520 m Patient Age:    51 years       BP:           160/107 mmHg Patient Gender: F              HR:           95 bpm. [facility] Procedure: 2D Echo, Cardiac Doppler, Color Doppler and Intracardiac            Opacification Agent Indications:    CHF-Acute Diastolic I50.31  History:        Patient has no prior history of Echocardiogram examinations.                 Sepsis, Signs/Symptoms:Shortness of Breath; Risk                 Factors:Hypertension and Current Smoker.  Sonographer:    Aron Baba Referring Phys: [phone] DAVID TAT  Sonographer Comments: Image acquisition challenging due to patient body habitus and Image acquisition challenging due to respiratory motion. IMPRESSIONS  1. Left ventricular ejection fraction, by estimation, is 25 to 30%. The left ventricle has severely decreased function. The left ventricle demonstrates regional wall motion abnormalities (see scoring diagram/findings for description). Left ventricular diastolic parameters are consistent with Grade II diastolic dysfunction  (pseudonormalization).  2. Right ventricular systolic function is moderately reduced. The right ventricular size is mildly enlarged. There is mildly elevated pulmonary artery systolic pressure.  3. Left atrial size was mild to moderately dilated.  4. A small pericardial effusion is present. The pericardial effusion is circumferential.  5. The mitral valve is normal in structure. No evidence of mitral valve regurgitation. No evidence of mitral stenosis.  6. The aortic valve was not well visualized. Aortic valve regurgitation is not visualized. No aortic stenosis is present.  7. The inferior vena cava is normal in size with <50% respiratory variability, suggesting right atrial pressure of 8 mmHg. Comparison(s): No prior Echocardiogram. FINDINGS  Left Ventricle: Left ventricular ejection fraction, by estimation, is 25 to 30%. The left ventricle has severely decreased function. The left ventricle demonstrates  regional wall motion abnormalities. Definity contrast agent was given IV to delineate the left ventricular endocardial borders. The left ventricular internal cavity size was normal in size. There is no left ventricular hypertrophy. Left ventricular diastolic parameters are consistent with Grade II diastolic dysfunction (pseudonormalization).  LV Wall Scoring: The entire anterior wall and entire septum are akinetic. The entire lateral wall and entire inferior wall are hypokinetic. The apex is normal. Right Ventricle: The right ventricular size is mildly enlarged. No increase in right ventricular wall thickness. Right ventricular systolic function is moderately reduced. There is mildly elevated pulmonary artery systolic pressure. The tricuspid regurgitant velocity is 2.86 m/s, and with an assumed right atrial pressure of 8 mmHg, the estimated right ventricular systolic pressure is 40.7 mmHg. Left Atrium: Left atrial size was mild to moderately dilated. Right Atrium: Right atrial size was normal in size. Pericardium:  A small pericardial effusion is present. The pericardial effusion is circumferential. Mitral Valve: The mitral valve is normal in structure. No evidence of mitral valve regurgitation. No evidence of mitral valve stenosis. Tricuspid Valve: The tricuspid valve is normal in structure. Tricuspid valve regurgitation is mild . No evidence of tricuspid stenosis. Aortic Valve: The aortic valve was not well visualized. Aortic valve regurgitation is not visualized. No aortic stenosis is present. Pulmonic Valve: The pulmonic valve was not well visualized. Pulmonic valve regurgitation is mild. No evidence of pulmonic stenosis. Aorta: The aortic root and ascending aorta are structurally normal, with no evidence of dilitation. Venous: The inferior vena cava is normal in size with less than 50% respiratory variability, suggesting right atrial pressure of 8 mmHg. IAS/Shunts: No atrial level shunt detected by color flow Doppler.  LEFT VENTRICLE PLAX 2D LVIDd:         4.70 cm   Diastology LVIDs:         3.30 cm   LV e' medial:    8.73 cm/s LV PW:         1.10 cm   LV E/e' medial:  14.0 LV IVS:        0.70 cm   LV e' lateral:   7.89 cm/s LVOT diam:     1.70 cm   LV E/e' lateral: 15.5 LV SV:         32 LV SV Index:   21 LVOT Area:     2.27 cm  RIGHT VENTRICLE RV S prime:     7.80 cm/s TAPSE (M-mode): 1.6 cm LEFT ATRIUM             Index        RIGHT ATRIUM           Index LA diam:        3.30 cm 2.17 cm/m   RA Area:     13.60 cm LA Vol (A2C):   56.9 ml 37.43 ml/m  RA Volume:   32.30 ml  21.25 ml/m LA Vol (A4C):   69.1 ml 45.46 ml/m LA Biplane Vol: 67.1 ml 44.14 ml/m  AORTIC VALVE             PULMONIC VALVE LVOT Vmax:   81.50 cm/s  PR End Diast Vel: 13.62 msec LVOT Vmean:  53.400 cm/s LVOT VTI:    0.139 m  AORTA Ao Root diam: 2.90 cm Ao Asc diam:  3.00 cm MITRAL VALVE                TRICUSPID VALVE MV Area (PHT): 7.16 cm     TR Peak  grad:   32.7 mmHg MV Decel Time: 106 msec     TR Vmax:        286.00 cm/s MV E velocity: 122.00  cm/s MV A velocity: 64.90 cm/s   SHUNTS MV E/A ratio:  1.88         Systemic VTI:  0.14 m                             Systemic Diam: 1.70 cm Vishnu Priya Mallipeddi Electronically signed by Winfield Rast Mallipeddi Signature Date/Time: 09/05/2022/12:09:15 PM    Final    DG CHEST PORT 1 VIEW  Result Date: 09/04/2022 CLINICAL DATA:  Respiratory distress EXAM: PORTABLE CHEST 1 VIEW COMPARISON:  Chest radiograph 05/16/2018.  CT chest 09/02/2022 FINDINGS: Heart size and pulmonary vascularity are normal for technique. Diffuse nodular infiltrative pattern throughout the lungs demonstrating progression since previous studies. This could represent atypical infection, pneumoconiosis, or less likely metastatic disease. No pleural effusion. No pneumothorax. Mediastinal contours appear intact. IMPRESSION: Diffuse nodular infiltrative pattern throughout the lungs demonstrating progression since previous studies. Electronically Signed   By: Burman Nieves M.D.   On: 09/04/2022 19:16   CT Angio Chest/Abd/Pel for Dissection W and/or W/WO  Result Date: 09/02/2022 CLINICAL DATA:  Severe abdominal pain with nausea and vomiting. Concern for acute aortic syndrome. EXAM: CT ANGIOGRAPHY CHEST, ABDOMEN AND PELVIS TECHNIQUE: Non-contrast CT of the chest was initially obtained. Multidetector CT imaging through the chest, abdomen and pelvis was performed using the standard protocol during bolus administration of intravenous contrast. Multiplanar reconstructed images and MIPs were obtained and reviewed to evaluate the vascular anatomy. RADIATION DOSE REDUCTION: This exam was performed according to the departmental dose-optimization program which includes automated exposure control, adjustment of the mA and/or kV according to patient size and/or use of iterative reconstruction technique. CONTRAST:  OMNIPAQUE IOHEXOL 350 MG/ML SOLN COMPARISON:  CT abdomen pelvis dated 06/05/2022 and chest radiograph dated 05/16/2018. FINDINGS: CTA  CHEST FINDINGS Cardiovascular: Preferential opacification of the thoracic aorta. No evidence of thoracic aortic aneurysm, intramural hematoma, or dissection. Heart is mildly enlarged. Trace pericardial effusion. Mediastinum/Nodes: No enlarged mediastinal, hilar, or axillary lymph nodes. Thyroid gland, trachea, and esophagus demonstrate no significant findings. Lungs/Pleura: Moderate bilateral ground-glass opacities with areas of nodular consolidation are noted. Trace bilateral pleural effusions. No pneumothorax. Musculoskeletal: A chronic unfused distal right clavicle fracture versus degenerative changes is noted. No acute osseous injury. Review of the MIP images confirms the above findings. CTA ABDOMEN AND PELVIS FINDINGS VASCULAR Aorta: Atherosclerotic disease without aneurysm, dissection, vasculitis or significant stenosis. Celiac: Atherosclerotic disease without evidence of aneurysm, dissection, vasculitis or significant stenosis. SMA: Patent without evidence of aneurysm, dissection, vasculitis or significant stenosis. Renals: Both renal arteries are patent without evidence of aneurysm, dissection, vasculitis, fibromuscular dysplasia or significant stenosis. IMA: Patent without evidence of aneurysm, dissection, vasculitis or significant stenosis. Inflow: Atherosclerotic disease without evidence of aneurysm, dissection, vasculitis or significant stenosis. Veins: No obvious venous abnormality within the limitations of this arterial phase study. Review of the MIP images confirms the above findings. NON-VASCULAR Hepatobiliary: No focal liver abnormality is seen. No gallstones, gallbladder wall thickening, or biliary dilatation. Pancreas: Unremarkable. No pancreatic ductal dilatation or surrounding inflammatory changes. Spleen: Normal in size without focal abnormality. Adrenals/Urinary Tract: Adrenal glands are unremarkable. There is a focal area of swelling and decreased enhancement involving the anterior/inferior  pole of the left kidney (series 11, images 120 6-28). There is a mild fat  stranding and edema in the left perinephric space. Septated cystic lesion in the upper left kidney is unchanged, measuring 3.9 x 2.9 cm. No renal calculi on the left. The right kidney appears normal, without renal calculi, focal lesion, or hydronephrosis. Bladder is unremarkable. Stomach/Bowel: Stomach is within normal limits. There is bowel wall thickening and mucosal hyperenhancement of the ascending colon and proximal transverse colon. The appendix appears normal. No evidence of bowel obstruction. Lymphatic: No abnormally enlarged lymph nodes in the abdomen or pelvis. Reproductive: Status post hysterectomy. No adnexal masses. Other: No abdominal wall hernia or abnormality. No abdominopelvic ascites. Musculoskeletal: No acute or significant osseous findings. Review of the MIP images confirms the above findings. IMPRESSION: 1. Focal area of swelling and decreased enhancement involving the anterior/inferior pole of the left kidney with mild fat stranding and edema in the left perinephric space. Findings are concerning for focal pyelonephritis. 2. Bowel wall thickening and mucosal hyperenhancement of the ascending colon and proximal transverse colon is concerning for colitis. 3. No aortic aneurysm, intramural hematoma, or dissection. 4. Moderate bilateral ground-glass opacities with areas of nodular consolidation and trace bilateral pleural effusions, likely multifocal pneumonia. 5. Mild cardiomegaly and trace pericardial effusion. 6. Septated cystic structure at the upper pole the left kidney was described as Bosniak 50F on prior MRI of the abdomen from 12/23/2020 with recommendation for 1 year follow-up. Aortic Atherosclerosis (ICD10-I70.0). Electronically Signed   By: Romona Curls M.D.   On: 09/02/2022 13:38    Danniela Mcbrearty Laural Benes, MD   [facility]  If 7PM-7AM, please contact night-coverage [email or web address] Password [facility] 09/08/2022,  11:56 AM   LOS: 6 days

## 2022-09-08 NOTE — TOC Progression Note (Signed)
Transition of Care [facility] - Progression Note    Patient Details  Name: [name] MRN: [id number] Date of Birth: [birth year]  Transition of Care [facility]) CM/SW Contact  Catalina Gravel, [place] Phone Number: 09/08/2022, 5:08 PM  Clinical Narrative:     CSW  contacted Jeri Modena @ [facility]. They can have IV in home by Monday.  CSW secure chat to MD and pharmacy, pt expected to receive IV Antibiotic tomorrow and DC. Sunday   Expected Discharge Plan: Home w Home Health Services Barriers to Discharge: Continued Medical Work up  Expected Discharge Plan and Services In-house Referral: Clinical Social Work   Post Acute Care Choice: Home Health Living arrangements for the past 2 months: Single Family Home                             HH Agency: [facility] Date HH Agency Contacted: 09/06/22   Representative spoke with at [facility] Agency: Pam   Social Determinants of Health (SDOH) Interventions SDOH Screenings   Food Insecurity: No Food Insecurity (03/19/2017)  Transportation Needs: No Transportation Needs (03/19/2017)  Depression (PHQ2-9): Low Risk  (11/29/2020)  Financial Resource Strain: Low Risk  (03/19/2017)  Physical Activity: Inactive (03/19/2017)  Social Connections: Somewhat Isolated (03/19/2017)  Stress: Stress Concern Present (03/19/2017)  Tobacco Use: High Risk (09/03/2022)    Readmission Risk Interventions     No data to display

## 2022-09-09 ENCOUNTER — Encounter (HOSPITAL_COMMUNITY): Payer: Self-pay | Admitting: Internal Medicine

## 2022-09-09 DIAGNOSIS — J4489 Other specified chronic obstructive pulmonary disease: Secondary | ICD-10-CM | POA: Diagnosis present

## 2022-09-09 DIAGNOSIS — J449 Chronic obstructive pulmonary disease, unspecified: Secondary | ICD-10-CM | POA: Diagnosis present

## 2022-09-09 MED ORDER — POTASSIUM CHLORIDE CRYS ER 20 MEQ PO TBCR: 20.0000 meq | EXTENDED_RELEASE_TABLET | Freq: Every day | ORAL | 0 refills | Status: DC

## 2022-09-09 MED ORDER — OXYCODONE HCL 5 MG PO TABS: 5.0000 mg | ORAL_TABLET | Freq: Four times a day (QID) | ORAL | 0 refills | Status: AC | PRN

## 2022-09-09 MED ORDER — FUROSEMIDE 40 MG PO TABS: 40.0000 mg | ORAL_TABLET | Freq: Every day | ORAL | 2 refills | Status: DC

## 2022-09-09 MED ORDER — PROMETHAZINE HCL 25 MG PO TABS
12.5000 mg | ORAL_TABLET | Freq: Four times a day (QID) | ORAL | Status: DC | PRN
Start: 2022-09-09 — End: 2022-09-11

## 2022-09-09 MED ORDER — NITROGLYCERIN 0.4 MG SL SUBL: 0.4000 mg | SUBLINGUAL_TABLET | SUBLINGUAL | 2 refills | Status: DC | PRN

## 2022-09-09 MED ORDER — NEBULIZER SYSTEM ALL-IN-ONE MISC: 1.0000 | 0 refills | Status: DC

## 2022-09-09 MED ORDER — POTASSIUM CHLORIDE CRYS ER 20 MEQ PO TBCR: 40.0000 meq | EXTENDED_RELEASE_TABLET | Freq: Every day | ORAL | Status: DC

## 2022-09-09 MED ORDER — METOPROLOL SUCCINATE ER 25 MG PO TB24: 25.0000 mg | ORAL_TABLET | Freq: Every day | ORAL | 2 refills | Status: DC

## 2022-09-09 MED ORDER — IPRATROPIUM-ALBUTEROL 0.5-2.5 (3) MG/3ML IN SOLN: 3.0000 mL | RESPIRATORY_TRACT | 1 refills | Status: DC | PRN

## 2022-09-09 MED ORDER — SACUBITRIL-VALSARTAN 24-26 MG PO TABS: 1.0000 | ORAL_TABLET | Freq: Two times a day (BID) | ORAL | 1 refills | Status: DC

## 2022-09-09 NOTE — Progress Notes (Signed)
PRN Oxycodone and Fentanyl alternated during the night for c/o pain. Zofran given per order for reported nausea. Pt ambulated in room multiple times during the night.

## 2022-09-09 NOTE — Progress Notes (Addendum)
SATURATION QUALIFICATIONS: (This note is used to comply with regulatory documentation for home oxygen)  Patient Saturations on Room Air at Rest =86  Patient Saturations on Room Air while Ambulating = 84  Patient Saturations on2 Liters of oxygen while Ambulating = 93%  Please briefly explain why patient needs home oxygen: 

## 2022-09-09 NOTE — Discharge Instructions (Signed)
IMPORTANT INFORMATION: PAY CLOSE ATTENTION   PHYSICIAN DISCHARGE INSTRUCTIONS  Follow with Primary care provider  Smith Robert, MD (Inactive)  and other consultants as instructed by your Hospitalist Physician  SEEK MEDICAL CARE OR RETURN TO EMERGENCY ROOM IF SYMPTOMS COME BACK, WORSEN OR NEW PROBLEM DEVELOPS   Please note: You were cared for by a hospitalist during your hospital stay. Every effort will be made to forward records to your primary care provider.  You can request that your primary care provider send for your hospital records if they have not received them.  Once you are discharged, your primary care physician will handle any further medical issues. Please note that NO REFILLS for any discharge medications will be authorized once you are discharged, as it is imperative that you return to your primary care physician (or establish a relationship with a primary care physician if you do not have one) for your post hospital discharge needs so that they can reassess your need for medications and monitor your lab values.  Please get a complete blood count and chemistry panel checked by your Primary MD at your next visit, and again as instructed by your Primary MD.  Get Medicines reviewed and adjusted: Please take all your medications with you for your next visit with your Primary MD  Laboratory/radiological data: Please request your Primary MD to go over all hospital tests and procedure/radiological results at the follow up, please ask your primary care provider to get all Hospital records sent to his/her office.  In some cases, they will be blood work, cultures and biopsy results pending at the time of your discharge. Please request that your primary care provider follow up on these results.  If you are diabetic, please bring your blood sugar readings with you to your follow up appointment with primary care.    Please call and make your follow up appointments as soon as possible.     Also Note the following: If you experience worsening of your admission symptoms, develop shortness of breath, life threatening emergency, suicidal or homicidal thoughts you must seek medical attention immediately by calling 911 or calling your MD immediately  if symptoms less severe.  You must read complete instructions/literature along with all the possible adverse reactions/side effects for all the Medicines you take and that have been prescribed to you. Take any new Medicines after you have completely understood and accpet all the possible adverse reactions/side effects.   Do not drive when taking Pain medications or sleeping medications (Benzodiazepines)  Do not take more than prescribed Pain, Sleep and Anxiety Medications. It is not advisable to combine anxiety,sleep and pain medications without talking with your primary care practitioner  Special Instructions: If you have smoked or chewed Tobacco  in the last 2 yrs please stop smoking, stop any regular Alcohol  and or any Recreational drug use.  Wear Seat belts while driving.  Do not drive if taking any narcotic, mind altering or controlled substances or recreational drugs or alcohol.

## 2022-09-09 NOTE — Discharge Summary (Signed)
Physician Discharge Summary  [name] KGM:[id number] DOB: 13-Mar-1971 DOA: 09/02/2022  PCP: Smith Robert, MD (Inactive) Cardiology: [facility]  Admit date: 09/02/2022 Discharge date: 09/09/2022  Admitted From:  Home  Disposition: Home with [facility]   Recommendations for Outpatient Follow-up:  Follow up with PCP in 1 weeks Follow up with cardiology on 09/24/22 as scheduled  Please obtain BMP in 1-2 weeks to follow up renal function, electrolytes   Home Health: RN, DME home oxygen 2L/min  Discharge Condition: STABLE   CODE STATUS: FULL  DIET: 2 gram sodium restricted    Brief Hospitalization Summary: Please see all hospital notes, images, labs for full details of the hospitalization. ADMISSION HPI:  52 year old female with a history of hypertension, chronic abdominal pain, IBS, colitis, constipation, chronic nausea presenting with worsening generalized abdominal pain Last 24 hours prior to admission.  The patient's spouse at the bedside supplements the history.  The patient is a difficult historian.  Notably, the patient's spouse states that the patient has developed chest congestion, coughing, and some dyspnea on exertion over the past week.  She continues to smoke 1 pack/day.  She has not been on any recent antibiotics.  She has had some generalized weakness.  She has had decreased oral intake.  However, in the last 24 hours prior to admission she developed numerous episodes of nausea and vomiting.  There is no hematemesis.  The patient has not had any diarrhea.  There is no hematochezia or melena.  Spouse also relates that the patient has had some symptoms of urinary urgency in the last day to 2 days.  The patient normally takes BC powders 2 times per week for pain.  There is no hematuria or hemoptysis.  There is no hematemesis. Because of her worsening condition, the patient was brought to emergency department for further evaluation and treatment.   The patient has an  extensive GI history for her abdominal pain and IBS and colitis.  Notably, in April 2022 the patient was noted to have colonic wall thickening/colitis on CT.  She was  treated empirically for IBD/suspected Crohn's with prednisone and Entocort with follow-up colonoscopy in February 2023 (off all steroids for several months)   normal other than nonbleeding internal hemorrhoids and normal colon biopsies.   More recently, the patient was seen in the emergency department on 05/27/2022 with worsening abdominal pain, persistent vomiting throughout the day. CT A/P showed diffuse colonic wall thickening extending from distal transverse colon throughout the rectosigmoid junction consistent with inflammatory or infectious colitis. She was given a 5-day course of prednisone. She also had CT angiography 06/05/2022 that showed patent mesenteric vasculature. She was prescribed a 5-day course of Cipro.   She was most recently in the GI clinic on 08/23/2022 for her abdominal pain, IBS, and possible colitis.  It was felt that her chronic abdominal pain was multifactorial including IBS, bowel hypersensitivity syndrome, and functional abdominal pain.  She was instructed to continue dicyclomine and Linzess. Regarding her chronic nausea and vomiting she has had extensive workup withMRI abdomen in 2022 without biliary abnormalities. Gastric emptying study normal in 2023 and EGD February 2023 consistent with Candida esophagitis and gastritis.   In the ED, the patient was afebrile hemodynamically stable with oxygen saturation 90% on room air.  WBC 16.9, hemoglobin 8.4, platelets 222,000.  Sodium 136, potassium 3.0, bicarbonate 25, serum creatinine 0.92.  AST 47, ALT 18, alk phosphatase 187, total bilirubin 0.6.  CTA abdomen was negative for any contrast extravasation.  Did show focal area of swelling and decreased enhancement in the left kidney with mild fat perinephric stranding.  There was bowel thickening and mucosal enhancement in  the ascending and proximal transverse colon.  CTA chest was negative for any aortic dissection.  There is moderate GGO bilateral with nodular consolidation.  The patient was started on ceftriaxone and metronidazole.    Assessment/Plan:   Severe Sepsis due to UTI  -presented with fever (103.5) and leukocytosis -due to bacteremia, pyelonephritis and PNA -lactic peaked at 5.0 -initially on  ceftriaxone, azithro, metronidazole -follow blood culture--ESBL E coli  -changed to meropenem/ertapenem for a full 10 day course    E. coli bacteremia -Preliminary multiplex PCR suggest ESBL -Discontinue ceftriaxone -continue meropenem -Discontinue metronidazole -ID consult requested and recommendation is for 10 day course of meropenem/ertapenem    Acute respiratory failure with hypoxia -Presented with tachypnea and hypoxia with saturation 90% on room air -Secondary to pneumonia in the setting of COPD -Stable on 2 L nasal cannula>>weaned to RA -Wean oxygen as tolerated for saturation greater 92% -09/04/22 evening--pt had respiratory distress -09/04/22 CXR--personally reviewed--bilateral interstitial and nodular infiltrates -weaned down to 2L prior to discharge   Acute pulmonary edema - TREATED  -09/04/22 evening--respiratory distress -start IV lasix - transitioned to oral lasix to start 09/09/22  -BNP 2180 -Echo - EF 25-30%  -DC on oral lasix 40 mg daily with potassium supplement -pt has appt to follow up with cardiology on 09/24/22    Intake/Output Summary (Last 24 hours) at 09/09/2022 1500 Last data filed at 09/09/2022 1411 Gross per 24 hour  Intake 200 ml  Output 2450 ml  Net -2250 ml   Filed Weights   09/07/22 0500 09/08/22 0453 09/09/22 0500  Weight: 55.8 kg 61.9 kg 56.9 kg    New Cardiomyopathy - Echo with worrisome findings of EF 25-30% with LV RWMAs - will request inpatient cardiology consultation for further recommendation - pt started on GDMT by cardiology service     Lobar  pneumonia - TREATED  -initially treated with ceftriaxone and doxycycline until switched to meropenem -Check PCT 18.18 -CTA chest showed bilateral GGO with areas of nodular consolidation;  no dissection -check COVID--neg   Pyelonephritis -Obtain UA--21-50 WBC -Urine culture was not done as ordered -CT abdomen pelvis showed focal area of swelling and hypoenhancement in the left kidney with mild fat stranding and perinephric stranding -treated with meropenem /ertapenem for a full 10 day course    Colonic wall thickening/colitis - Treated  -GI consult appreciated -treated with meropenem in hospital and DC home on ertapenem to complete full 10 day course of therapy -Patient has follow-up Dr. Marletta Lor -lipase 26 -check C diff--pt initially did have loose stool with abd pain with leukocytosis -stool pathogen panel and cdiff ordered - negative   Chronic abdominal pain -Felt to be multifactorial including IBS, bowel hypersensitivity, functional bowel syndrome -Judicious opioids -UDS--positive for opiates and benzodiazepines -Continue pantoprazole -controlled with prn opioid medication  -careful with opioids as she became oversedated on morphine -continue bowel regimen (linzess)    Drug Seeking Behavior - long discussion with patient at length    Acute anemia -FOBT negative in the ED -Iron saturation 4%, ferritin 31 -Hgb down to 6.6 on 4/23--transfused 2 units PRBC, Hg up to 12.5   Chronic nausea and vomiting - RESOLVED  -Patient has had extensive workup as discussed above in the history -Continue around-the-clock antiemetic -Continue pantoprazole -emesis is improved, tolerating soft diet   Chronic constipation Continue Linzess  Anxiety / insomnia -Continue home dose temazepam -PDMP reviewed -continue fluoxetine -Patient receives temazepam 30 mg, #30 last refill 08/06/2022   Hypokalemia -Repleted -mag 1.8 -continue potassium supplement with oral lasix  -recommend  rechecking BMP in 1 week outpatient with PCP and /or cardiologist   Hypomagnesemia/hypocalcemia -09/03/2022 corrected calcium 8.0 -repleted    Essential Hypertension -Pt started on GDMT by cardiology -continue metoprol XL 25 mg daily, entresto BID, Lasix 40 mg daily    Acute metabolic encephalopathy - RESOLVED  -Secondary to infectious process -pt back to baseline mentation   Tobacco - pt strongly advised to stop all tobacco usage   Discharge Diagnoses:  Principal Problem:   Pyelonephritis Active Problems:   Chronic pain syndrome   Hypokalemia   Multifocal pneumonia   Essential hypertension   Sepsis due to undetermined organism (HCC)   Sepsis due to Escherichia coli (E. coli) (HCC)   Gram-negative bacteremia   Periumbilical abdominal pain   UGIB (upper gastrointestinal bleed)   Anemia   COPD (chronic obstructive pulmonary disease) (HCC)   Discharge Instructions: Discharge Instructions     [facility] pharmacist to adjust dose for Vancomycin, Aminoglycosides and other anti-infective therapies as requested by physician.   Complete by: As directed    [facility] to provide Cath Flo 2mg    Complete by: As directed    Administer for PICC line occlusion and as ordered by physician for other access device issues.   Anaphylaxis Kit: Provided to treat any anaphylactic reaction to the medication being provided to the patient if First Dose or when requested by physician   Complete by: As directed    Epinephrine 1mg /ml vial / amp: Administer 0.3mg  (0.68ml) subcutaneously once for moderate to severe anaphylaxis, nurse to call physician and pharmacy when reaction occurs and call 911 if needed for immediate care   Diphenhydramine 50mg /ml IV vial: Administer 25-50mg  IV/IM PRN for first dose reaction, rash, itching, mild reaction, nurse to call physician and pharmacy when reaction occurs   Sodium Chloride 0.9% NS IV: Administer if needed for hypovolemic blood  pressure drop or as ordered by physician after call to physician with anaphylactic reaction   Change dressing on IV access line weekly and PRN   Complete by: As directed    Flush IV access with Sodium Chloride 0.9% and Heparin 10 units/ml or 100 units/ml   Complete by: As directed    Home infusion instructions - [facility]   Complete by: As directed    Instructions: Flush IV access with Sodium Chloride 0.9% and Heparin 10units/ml or 100units/ml   Change dressing on IV access line: Weekly and PRN   Instructions Cath Flo 2mg : Administer for PICC Line occlusion and as ordered by physician for other access device   [facility] pharmacist to adjust dose for: Vancomycin, Aminoglycosides and other anti-infective therapies as requested by physician   Method of administration may be changed at the discretion of home infusion pharmacist based upon assessment of the patient and/or caregiver's ability to self-administer the medication ordered   Complete by: As directed       Allergies as of 09/09/2022       Reactions   Buprenorphine Other (See Comments)   Chest pain/ringing in ears and feet swelling   Penicillins Swelling, Rash      Aripiprazole Other (See Comments)   seizure   Seroquel [quetiapine] Other (See Comments)   Seizure (08/18/20)   Cefaclor Other (See Comments)   Muscles locked up  Doxycycline Rash   Gabapentin Nausea And Vomiting   dizzy   Ibuprofen Swelling, Rash   Levofloxacin Rash   Itching   Lisinopril Nausea And Vomiting   Macrobid [nitrofurantoin Monohyd Macro] Rash   Naproxen Swelling, Rash        Medication List     STOP taking these medications    busPIRone 5 MG tablet Commonly known as: BUSPAR   dicyclomine 20 MG tablet Commonly known as: BENTYL   linaclotide 72 MCG capsule Commonly known as: Linzess   losartan 25 MG tablet Commonly known as: COZAAR   venlafaxine 37.5 MG tablet Commonly known as: EFFEXOR       TAKE these  medications    acetaminophen 500 MG tablet Commonly known as: TYLENOL Take by mouth.   albuterol 108 (90 Base) MCG/ACT inhaler Commonly known as: VENTOLIN HFA Inhale 2 puffs into the lungs every 4 (four) hours as needed for shortness of breath or wheezing.   ertapenem  IVPB Commonly known as: INVANZ Inject 1 g into the vein daily for 5 days. Indication:  ESBL E coli Bacteremia First Dose: Yes Last Day of Therapy:  09/12/22 Labs - Once weekly:  CBC/D and BMP, Labs - Every other week:  ESR and CRP Method of administration: Mini-Bag Plus / Gravity Method of administration may be changed at the discretion of home infusion pharmacist based upon assessment of the patient and/or caregiver's ability to self-administer the medication ordered.   famotidine 20 MG tablet Commonly known as: PEPCID Take 20 mg by mouth daily. For break through heart burn   FLUoxetine 40 MG capsule Commonly known as: PROZAC Take 40 mg by mouth daily.   furosemide 40 MG tablet Commonly known as: LASIX Take 1 tablet (40 mg total) by mouth daily. Start taking on: September 10, 2022   hydrOXYzine 50 MG tablet Commonly known as: ATARAX Take 50 mg by mouth every 6 (six) hours as needed for anxiety.   ipratropium-albuterol 0.5-2.5 (3) MG/3ML Soln Commonly known as: DUONEB Take 3 mLs by nebulization every 4 (four) hours as needed (SOB, wheezing, coughing).   metoprolol succinate 25 MG 24 hr tablet Commonly known as: TOPROL-XL Take 1 tablet (25 mg total) by mouth daily. Take with or immediately following a meal. What changed:  medication strength how much to take   Nebulizer System All-In-One Misc 1 Device by Does not apply route as directed.   Nicoderm CQ 21 mg/24hr patch Generic drug: nicotine Place 21 mg onto the skin daily.   nitroGLYCERIN 0.4 MG SL tablet Commonly known as: NITROSTAT Place 1 tablet (0.4 mg total) under the tongue every 5 (five) minutes as needed for chest pain.   ondansetron 8 MG  disintegrating tablet Commonly known as: ZOFRAN-ODT TAKE ONE TABLET BY MOUTH EVERY 8 HOURS   oxyCODONE 5 MG immediate release tablet Commonly known as: Oxy IR/ROXICODONE Take 1 tablet (5 mg total) by mouth every 6 (six) hours as needed for up to 5 days for breakthrough pain or severe pain.   pantoprazole 40 MG tablet Commonly known as: PROTONIX TAKE (1) TABLET BY MOUTH TWICE A DAY BEFORE MEALS. (BREAKFAST AND SUPPER)   potassium chloride SA 20 MEQ tablet Commonly known as: KLOR-CON M Take 1 tablet (20 mEq total) by mouth daily. Start taking on: September 10, 2022   promethazine 25 MG tablet Commonly known as: PHENERGAN Take 0.5 tablets (12.5 mg total) by mouth every 6 (six) hours as needed for nausea or vomiting. What changed: See the new  instructions.   sacubitril-valsartan 24-26 MG Commonly known as: ENTRESTO Take 1 tablet by mouth 2 (two) times daily.   Spiriva HandiHaler 18 MCG inhalation capsule Generic drug: tiotropium 1 capsule by inhaling the contents of the capsule using the HandiHaler device Inhalation Once a day for 30 days   temazepam 30 MG capsule Commonly known as: RESTORIL Take 30 mg by mouth at bedtime as needed for sleep.   Vraylar 3 MG capsule Generic drug: cariprazine Take 3 mg by mouth daily.               Durable Medical Equipment  (From admission, onward)           Start     Ordered   09/09/22 1215  For home use only DME oxygen  Once       Question Answer Comment  Length of Need 6 Months   Mode or (Route) Nasal cannula   Liters per Minute 2   Frequency Continuous (stationary and portable oxygen unit needed)   Oxygen conserving device Yes   Oxygen delivery system Gas      09/09/22 1215              Discharge Care Instructions  (From admission, onward)           Start     Ordered   09/07/22 0000  Change dressing on IV access line weekly and PRN  (Home infusion instructions - [facility] )        09/07/22 1102             Follow-up Information     Sharlene Dory, NP Follow up on 09/24/2022.   Specialty: Cardiology Why: Cardiology Hospital Follow-up on 09/24/2022 at 2:30 PM. Contact information: [address] [place] [address] [phone]         Smith Robert, MD. Schedule an appointment as soon as possible for a visit in 1 week(s).   Specialty: Family Medicine Why: Hospital Follow Up Contact information: [address] [place] [address] [employer] [place] [address] [phone]                Allergies  Allergen Reactions   Buprenorphine Other (See Comments)    Chest pain/ringing in ears and feet swelling   Penicillins Swelling and Rash        Aripiprazole Other (See Comments)    seizure   Seroquel [Quetiapine] Other (See Comments)    Seizure (08/18/20)   Cefaclor Other (See Comments)    Muscles locked up   Doxycycline Rash   Gabapentin Nausea And Vomiting    dizzy   Ibuprofen Swelling and Rash   Levofloxacin Rash    Itching   Lisinopril Nausea And Vomiting   Macrobid [Nitrofurantoin Monohyd Macro] Rash   Naproxen Swelling and Rash   Allergies as of 09/09/2022       Reactions   Buprenorphine Other (See Comments)   Chest pain/ringing in ears and feet swelling   Penicillins Swelling, Rash      Aripiprazole Other (See Comments)   seizure   Seroquel [quetiapine] Other (See Comments)   Seizure (08/18/20)   Cefaclor Other (See Comments)   Muscles locked up   Doxycycline Rash   Gabapentin Nausea And Vomiting   dizzy   Ibuprofen Swelling, Rash   Levofloxacin Rash   Itching   Lisinopril Nausea And Vomiting   Macrobid [nitrofurantoin Monohyd Macro] Rash   Naproxen Swelling, Rash        Medication  List     STOP taking these medications    busPIRone 5 MG tablet Commonly known as: BUSPAR   dicyclomine 20 MG tablet Commonly known as: BENTYL   linaclotide 72 MCG capsule Commonly known as: Linzess   losartan 25 MG tablet Commonly known as: COZAAR    venlafaxine 37.5 MG tablet Commonly known as: EFFEXOR       TAKE these medications    acetaminophen 500 MG tablet Commonly known as: TYLENOL Take by mouth.   albuterol 108 (90 Base) MCG/ACT inhaler Commonly known as: VENTOLIN HFA Inhale 2 puffs into the lungs every 4 (four) hours as needed for shortness of breath or wheezing.   ertapenem  IVPB Commonly known as: INVANZ Inject 1 g into the vein daily for 5 days. Indication:  ESBL E coli Bacteremia First Dose: Yes Last Day of Therapy:  09/12/22 Labs - Once weekly:  CBC/D and BMP, Labs - Every other week:  ESR and CRP Method of administration: Mini-Bag Plus / Gravity Method of administration may be changed at the discretion of home infusion pharmacist based upon assessment of the patient and/or caregiver's ability to self-administer the medication ordered.   famotidine 20 MG tablet Commonly known as: PEPCID Take 20 mg by mouth daily. For break through heart burn   FLUoxetine 40 MG capsule Commonly known as: PROZAC Take 40 mg by mouth daily.   furosemide 40 MG tablet Commonly known as: LASIX Take 1 tablet (40 mg total) by mouth daily. Start taking on: September 10, 2022   hydrOXYzine 50 MG tablet Commonly known as: ATARAX Take 50 mg by mouth every 6 (six) hours as needed for anxiety.   ipratropium-albuterol 0.5-2.5 (3) MG/3ML Soln Commonly known as: DUONEB Take 3 mLs by nebulization every 4 (four) hours as needed (SOB, wheezing, coughing).   metoprolol succinate 25 MG 24 hr tablet Commonly known as: TOPROL-XL Take 1 tablet (25 mg total) by mouth daily. Take with or immediately following a meal. What changed:  medication strength how much to take   Nebulizer System All-In-One Misc 1 Device by Does not apply route as directed.   Nicoderm CQ 21 mg/24hr patch Generic drug: nicotine Place 21 mg onto the skin daily.   nitroGLYCERIN 0.4 MG SL tablet Commonly known as: NITROSTAT Place 1 tablet (0.4 mg total) under the  tongue every 5 (five) minutes as needed for chest pain.   ondansetron 8 MG disintegrating tablet Commonly known as: ZOFRAN-ODT TAKE ONE TABLET BY MOUTH EVERY 8 HOURS   oxyCODONE 5 MG immediate release tablet Commonly known as: Oxy IR/ROXICODONE Take 1 tablet (5 mg total) by mouth every 6 (six) hours as needed for up to 5 days for breakthrough pain or severe pain.   pantoprazole 40 MG tablet Commonly known as: PROTONIX TAKE (1) TABLET BY MOUTH TWICE A DAY BEFORE MEALS. (BREAKFAST AND SUPPER)   potassium chloride SA 20 MEQ tablet Commonly known as: KLOR-CON M Take 1 tablet (20 mEq total) by mouth daily. Start taking on: September 10, 2022   promethazine 25 MG tablet Commonly known as: PHENERGAN Take 0.5 tablets (12.5 mg total) by mouth every 6 (six) hours as needed for nausea or vomiting. What changed: See the new instructions.   sacubitril-valsartan 24-26 MG Commonly known as: ENTRESTO Take 1 tablet by mouth 2 (two) times daily.   Spiriva HandiHaler 18 MCG inhalation capsule Generic drug: tiotropium 1 capsule by inhaling the contents of the capsule using the HandiHaler device Inhalation Once a day for 30  days   temazepam 30 MG capsule Commonly known as: RESTORIL Take 30 mg by mouth at bedtime as needed for sleep.   Vraylar 3 MG capsule Generic drug: cariprazine Take 3 mg by mouth daily.               Durable Medical Equipment  (From admission, onward)           Start     Ordered   09/09/22 1215  For home use only DME oxygen  Once       Question Answer Comment  Length of Need 6 Months   Mode or (Route) Nasal cannula   Liters per Minute 2   Frequency Continuous (stationary and portable oxygen unit needed)   Oxygen conserving device Yes   Oxygen delivery system Gas      09/09/22 1215              Discharge Care Instructions  (From admission, onward)           Start     Ordered   09/07/22 0000  Change dressing on IV access line weekly and PRN   (Home infusion instructions - [facility] )        09/07/22 1102            Procedures/Studies: [place] EKG SITE RITE  Result Date: 09/07/2022 If Site Rite image not attached, placement could not be confirmed due to current cardiac rhythm.  DG CHEST PORT 1 VIEW  Result Date: 09/07/2022 CLINICAL DATA:  Provided history: Heart failure. Pulmonary edema. EXAM: PORTABLE CHEST 1 VIEW COMPARISON:  Chest CT 09/05/2022. Prior chest radiographs 09/04/2022 and earlier. FINDINGS: Mild cardiomegaly. Diffuse patchy airspace disease throughout both lungs, similar to the prior examination of 09/04/2022. No appreciable pleural effusion. No evidence of pneumothorax. No acute osseous abnormality identified. Levocurvature of the thoracic spine. Surgical clips within the right upper quadrant of the abdomen. IMPRESSION: 1. Diffuse patchy airspace disease throughout both lungs, similar to the prior examination of 09/04/2022. Findings may be due to pulmonary edema and/or infection. 2. Mild cardiomegaly. Electronically Signed   By: Jackey Loge D.O.   On: 09/07/2022 08:33   CT CHEST WO CONTRAST  Result Date: 09/05/2022 CLINICAL DATA:  Abnormal chest radiograph EXAM: CT CHEST WITHOUT CONTRAST TECHNIQUE: Multidetector CT imaging of the chest was performed following the standard protocol without IV contrast. RADIATION DOSE REDUCTION: This exam was performed according to the departmental dose-optimization program which includes automated exposure control, adjustment of the mA and/or kV according to patient size and/or use of iterative reconstruction technique. COMPARISON:  CT chest, abdomen and pelvis dated April 21st 2024 FINDINGS: Cardiovascular: Mild cardiomegaly. Trace pericardial effusion, slightly increased in size when compared with the prior exam. Normal caliber thoracic aorta with moderate atherosclerotic disease. Mild coronary artery calcifications. Mediastinum/Nodes: Esophagus and thyroid are unremarkable.  Mildly enlarged mediastinal lymph nodes, unchanged when compared with the prior exam and likely reactive. Reference subcarinal lymph node measuring 1.4 cm in short axis on series 2, image 64. Lungs/Pleura: Central airways are patent. New patchy upper lung predominant ground-glass opacities with associated interlobular septal thickening. New trace right-greater-than-left pleural effusions. Upper Abdomen: Prior cholecystectomy.  Trace perihepatic ascites. Musculoskeletal: No chest wall mass or suspicious bone lesions identified. IMPRESSION: 1. New patchy upper lung predominant ground-glass opacities with associated septal thickening, likely due pulmonary edema or infection. 2. New trace right-greater-than-left pleural effusions. 3. Trace pericardial effusion, slightly increased in size when compared with the prior exam. 4. Aortic  Atherosclerosis (ICD10-I70.0). Electronically Signed   By: Allegra Lai M.D.   On: 09/05/2022 15:17   ECHOCARDIOGRAM COMPLETE  Result Date: 09/05/2022    ECHOCARDIOGRAM REPORT   Patient Name:   [name] Date of Exam: 09/05/2022 Medical Rec #:  [id number]      Height:       61.0 in Accession #:    [id number]     Weight:       120.0 lb Date of Birth:  1971-04-07     BSA:          1.520 m Patient Age:    51 years       BP:           160/107 mmHg Patient Gender: F              HR:           95 bpm. [facility] Procedure: 2D Echo, Cardiac Doppler, Color Doppler and Intracardiac            Opacification Agent Indications:    CHF-Acute Diastolic I50.31  History:        Patient has no prior history of Echocardiogram examinations.                 Sepsis, Signs/Symptoms:Shortness of Breath; Risk                 Factors:Hypertension and Current Smoker.  Sonographer:    Aron Baba Referring Phys: [phone] DAVID TAT  Sonographer Comments: Image acquisition challenging due to patient body habitus and Image acquisition challenging due to respiratory motion. IMPRESSIONS  1. Left  ventricular ejection fraction, by estimation, is 25 to 30%. The left ventricle has severely decreased function. The left ventricle demonstrates regional wall motion abnormalities (see scoring diagram/findings for description). Left ventricular diastolic parameters are consistent with Grade II diastolic dysfunction (pseudonormalization).  2. Right ventricular systolic function is moderately reduced. The right ventricular size is mildly enlarged. There is mildly elevated pulmonary artery systolic pressure.  3. Left atrial size was mild to moderately dilated.  4. A small pericardial effusion is present. The pericardial effusion is circumferential.  5. The mitral valve is normal in structure. No evidence of mitral valve regurgitation. No evidence of mitral stenosis.  6. The aortic valve was not well visualized. Aortic valve regurgitation is not visualized. No aortic stenosis is present.  7. The inferior vena cava is normal in size with <50% respiratory variability, suggesting right atrial pressure of 8 mmHg. Comparison(s): No prior Echocardiogram. FINDINGS  Left Ventricle: Left ventricular ejection fraction, by estimation, is 25 to 30%. The left ventricle has severely decreased function. The left ventricle demonstrates regional wall motion abnormalities. Definity contrast agent was given IV to delineate the left ventricular endocardial borders. The left ventricular internal cavity size was normal in size. There is no left ventricular hypertrophy. Left ventricular diastolic parameters are consistent with Grade II diastolic dysfunction (pseudonormalization).  LV Wall Scoring: The entire anterior wall and entire septum are akinetic. The entire lateral wall and entire inferior wall are hypokinetic. The apex is normal. Right Ventricle: The right ventricular size is mildly enlarged. No increase in right ventricular wall thickness. Right ventricular systolic function is moderately reduced. There is mildly elevated pulmonary  artery systolic pressure. The tricuspid regurgitant velocity is 2.86 m/s, and with an assumed right atrial pressure of 8 mmHg, the estimated right ventricular systolic pressure is 40.7 mmHg. Left Atrium: Left atrial size was mild to  moderately dilated. Right Atrium: Right atrial size was normal in size. Pericardium: A small pericardial effusion is present. The pericardial effusion is circumferential. Mitral Valve: The mitral valve is normal in structure. No evidence of mitral valve regurgitation. No evidence of mitral valve stenosis. Tricuspid Valve: The tricuspid valve is normal in structure. Tricuspid valve regurgitation is mild . No evidence of tricuspid stenosis. Aortic Valve: The aortic valve was not well visualized. Aortic valve regurgitation is not visualized. No aortic stenosis is present. Pulmonic Valve: The pulmonic valve was not well visualized. Pulmonic valve regurgitation is mild. No evidence of pulmonic stenosis. Aorta: The aortic root and ascending aorta are structurally normal, with no evidence of dilitation. Venous: The inferior vena cava is normal in size with less than 50% respiratory variability, suggesting right atrial pressure of 8 mmHg. IAS/Shunts: No atrial level shunt detected by color flow Doppler.  LEFT VENTRICLE PLAX 2D LVIDd:         4.70 cm   Diastology LVIDs:         3.30 cm   LV e' medial:    8.73 cm/s LV PW:         1.10 cm   LV E/e' medial:  14.0 LV IVS:        0.70 cm   LV e' lateral:   7.89 cm/s LVOT diam:     1.70 cm   LV E/e' lateral: 15.5 LV SV:         32 LV SV Index:   21 LVOT Area:     2.27 cm  RIGHT VENTRICLE RV S prime:     7.80 cm/s TAPSE (M-mode): 1.6 cm LEFT ATRIUM             Index        RIGHT ATRIUM           Index LA diam:        3.30 cm 2.17 cm/m   RA Area:     13.60 cm LA Vol (A2C):   56.9 ml 37.43 ml/m  RA Volume:   32.30 ml  21.25 ml/m LA Vol (A4C):   69.1 ml 45.46 ml/m LA Biplane Vol: 67.1 ml 44.14 ml/m  AORTIC VALVE             PULMONIC VALVE LVOT Vmax:    81.50 cm/s  PR End Diast Vel: 13.62 msec LVOT Vmean:  53.400 cm/s LVOT VTI:    0.139 m  AORTA Ao Root diam: 2.90 cm Ao Asc diam:  3.00 cm MITRAL VALVE                TRICUSPID VALVE MV Area (PHT): 7.16 cm     TR Peak grad:   32.7 mmHg MV Decel Time: 106 msec     TR Vmax:        286.00 cm/s MV E velocity: 122.00 cm/s MV A velocity: 64.90 cm/s   SHUNTS MV E/A ratio:  1.88         Systemic VTI:  0.14 m                             Systemic Diam: 1.70 cm Vishnu Priya Mallipeddi Electronically signed by Winfield Rast Mallipeddi Signature Date/Time: 09/05/2022/12:09:15 PM    Final    DG CHEST PORT 1 VIEW  Result Date: 09/04/2022 CLINICAL DATA:  Respiratory distress EXAM: PORTABLE CHEST 1 VIEW COMPARISON:  Chest radiograph 05/16/2018.  CT chest 09/02/2022 FINDINGS:  Heart size and pulmonary vascularity are normal for technique. Diffuse nodular infiltrative pattern throughout the lungs demonstrating progression since previous studies. This could represent atypical infection, pneumoconiosis, or less likely metastatic disease. No pleural effusion. No pneumothorax. Mediastinal contours appear intact. IMPRESSION: Diffuse nodular infiltrative pattern throughout the lungs demonstrating progression since previous studies. Electronically Signed   By: Burman Nieves M.D.   On: 09/04/2022 19:16   CT Angio Chest/Abd/Pel for Dissection W and/or W/WO  Result Date: 09/02/2022 CLINICAL DATA:  Severe abdominal pain with nausea and vomiting. Concern for acute aortic syndrome. EXAM: CT ANGIOGRAPHY CHEST, ABDOMEN AND PELVIS TECHNIQUE: Non-contrast CT of the chest was initially obtained. Multidetector CT imaging through the chest, abdomen and pelvis was performed using the standard protocol during bolus administration of intravenous contrast. Multiplanar reconstructed images and MIPs were obtained and reviewed to evaluate the vascular anatomy. RADIATION DOSE REDUCTION: This exam was performed according to the departmental  dose-optimization program which includes automated exposure control, adjustment of the mA and/or kV according to patient size and/or use of iterative reconstruction technique. CONTRAST:  OMNIPAQUE IOHEXOL 350 MG/ML SOLN COMPARISON:  CT abdomen pelvis dated 06/05/2022 and chest radiograph dated 05/16/2018. FINDINGS: CTA CHEST FINDINGS Cardiovascular: Preferential opacification of the thoracic aorta. No evidence of thoracic aortic aneurysm, intramural hematoma, or dissection. Heart is mildly enlarged. Trace pericardial effusion. Mediastinum/Nodes: No enlarged mediastinal, hilar, or axillary lymph nodes. Thyroid gland, trachea, and esophagus demonstrate no significant findings. Lungs/Pleura: Moderate bilateral ground-glass opacities with areas of nodular consolidation are noted. Trace bilateral pleural effusions. No pneumothorax. Musculoskeletal: A chronic unfused distal right clavicle fracture versus degenerative changes is noted. No acute osseous injury. Review of the MIP images confirms the above findings. CTA ABDOMEN AND PELVIS FINDINGS VASCULAR Aorta: Atherosclerotic disease without aneurysm, dissection, vasculitis or significant stenosis. Celiac: Atherosclerotic disease without evidence of aneurysm, dissection, vasculitis or significant stenosis. SMA: Patent without evidence of aneurysm, dissection, vasculitis or significant stenosis. Renals: Both renal arteries are patent without evidence of aneurysm, dissection, vasculitis, fibromuscular dysplasia or significant stenosis. IMA: Patent without evidence of aneurysm, dissection, vasculitis or significant stenosis. Inflow: Atherosclerotic disease without evidence of aneurysm, dissection, vasculitis or significant stenosis. Veins: No obvious venous abnormality within the limitations of this arterial phase study. Review of the MIP images confirms the above findings. NON-VASCULAR Hepatobiliary: No focal liver abnormality is seen. No gallstones, gallbladder wall  thickening, or biliary dilatation. Pancreas: Unremarkable. No pancreatic ductal dilatation or surrounding inflammatory changes. Spleen: Normal in size without focal abnormality. Adrenals/Urinary Tract: Adrenal glands are unremarkable. There is a focal area of swelling and decreased enhancement involving the anterior/inferior pole of the left kidney (series 11, images 120 6-28). There is a mild fat stranding and edema in the left perinephric space. Septated cystic lesion in the upper left kidney is unchanged, measuring 3.9 x 2.9 cm. No renal calculi on the left. The right kidney appears normal, without renal calculi, focal lesion, or hydronephrosis. Bladder is unremarkable. Stomach/Bowel: Stomach is within normal limits. There is bowel wall thickening and mucosal hyperenhancement of the ascending colon and proximal transverse colon. The appendix appears normal. No evidence of bowel obstruction. Lymphatic: No abnormally enlarged lymph nodes in the abdomen or pelvis. Reproductive: Status post hysterectomy. No adnexal masses. Other: No abdominal wall hernia or abnormality. No abdominopelvic ascites. Musculoskeletal: No acute or significant osseous findings. Review of the MIP images confirms the above findings. IMPRESSION: 1. Focal area of swelling and decreased enhancement involving the anterior/inferior pole of the left kidney with mild  fat stranding and edema in the left perinephric space. Findings are concerning for focal pyelonephritis. 2. Bowel wall thickening and mucosal hyperenhancement of the ascending colon and proximal transverse colon is concerning for colitis. 3. No aortic aneurysm, intramural hematoma, or dissection. 4. Moderate bilateral ground-glass opacities with areas of nodular consolidation and trace bilateral pleural effusions, likely multifocal pneumonia. 5. Mild cardiomegaly and trace pericardial effusion. 6. Septated cystic structure at the upper pole the left kidney was described as Bosniak 59F on  prior MRI of the abdomen from 12/23/2020 with recommendation for 1 year follow-up. Aortic Atherosclerosis (ICD10-I70.0). Electronically Signed   By: Romona Curls M.D.   On: 09/02/2022 13:38     Subjective: Pt reports she has a lot of support at home, no CP and no SOB. She is eager to follow up with cardiology and will follow up with her PCP and she again verbalized understanding of her heart failure diagnosis.  She is going to try to stop smoking cigarettes.    Discharge Exam: Vitals:   09/09/22 1319 09/09/22 1411  BP:  118/80  Pulse:  95  Resp:  20  Temp:  97.9 F (36.6 C)  SpO2: 95% 99%   Vitals:   09/09/22 0511 09/09/22 0720 09/09/22 1319 09/09/22 1411  BP: 110/71   118/80  Pulse: 73   95  Resp: 16   20  Temp: (!) 97.5 F (36.4 C)   97.9 F (36.6 C)  TempSrc: Oral   Oral  SpO2: 100% 98% 95% 99%  Weight:       General: Pt is alert, awake, not in acute distress Cardiovascular: normal S1/S2 +, no rubs, no gallops Respiratory: CTA bilaterally, no wheezing, no rhonchi Abdominal: Soft, NT, ND, bowel sounds + Extremities: trace bilateral pretibial edema, no cyanosis   The results of significant diagnostics from this hospitalization (including imaging, microbiology, ancillary and laboratory) are listed below for reference.     Microbiology: Recent Results (from the past 240 hour(s))  Culture, blood (routine x 2)     Status: None   Collection Time: 09/02/22  2:57 PM   Specimen: Blood  Result Value Ref Range Status   Specimen Description BOTTLES DRAWN AEROBIC ONLY BLOOD RIGHT HAND  Final   Special Requests Blood Culture adequate volume  Final   Culture   Final    NO GROWTH 5 DAYS Performed at [facility], [address], [place], [place] [address]    Report Status 09/07/2022 FINAL  Final  Culture, blood (routine x 2)     Status: Abnormal   Collection Time: 09/02/22  2:57 PM   Specimen: Left Antecubital; Blood  Result Value Ref Range Status   Specimen Description   Final     LEFT ANTECUBITAL BOTTLES DRAWN AEROBIC AND ANAEROBIC Performed at [facility], [address], [place], [place] [address]    Special Requests   Final    Blood Culture adequate volume Performed at [facility], [address] [place], [place] [address]    Culture  Setup Time   Final    GRAM NEGATIVE RODS Gram Stain Report Called to,Read Back By and Verified With: HAILE,A @ 0239 ON 09/03/22 BY JUW ANAEROBIC BOTTLE ONLY GS DONE @ [facility] CRITICAL RESULT CALLED TO, READ BACK BY AND VERIFIED WITH: A. HAILE RN 09/03/22 @ 0547 BY AB Performed at [facility], [address], [place], [place] [address]    Culture (A)  Final    ESCHERICHIA COLI Confirmed Extended Spectrum Beta-Lactamase  Producer (ESBL).  In bloodstream infections from ESBL organisms, carbapenems are preferred over piperacillin/tazobactam. They are shown to have a lower risk of mortality.    Report Status 09/05/2022 FINAL  Final   Organism ID, Bacteria ESCHERICHIA COLI  Final      Susceptibility   Escherichia coli - MIC*    AMPICILLIN >=32 RESISTANT Resistant     CEFEPIME 16 RESISTANT Resistant     CEFTAZIDIME RESISTANT Resistant     CEFTRIAXONE >=64 RESISTANT Resistant     CIPROFLOXACIN >=4 RESISTANT Resistant     GENTAMICIN >=16 RESISTANT Resistant     IMIPENEM <=0.25 SENSITIVE Sensitive     TRIMETH/SULFA >=320 RESISTANT Resistant     AMPICILLIN/SULBACTAM >=32 RESISTANT Resistant     PIP/TAZO <=4 SENSITIVE Sensitive     * ESCHERICHIA COLI  Blood Culture ID Panel (Reflexed)     Status: Abnormal   Collection Time: 09/02/22  2:57 PM  Result Value Ref Range Status   Enterococcus faecalis NOT DETECTED NOT DETECTED Final   Enterococcus Faecium NOT DETECTED NOT DETECTED Final   Listeria monocytogenes NOT DETECTED NOT DETECTED Final   Staphylococcus species NOT DETECTED NOT DETECTED Final   Staphylococcus aureus (BCID) NOT DETECTED NOT DETECTED Final   Staphylococcus epidermidis NOT DETECTED NOT DETECTED Final    Staphylococcus lugdunensis NOT DETECTED NOT DETECTED Final   Streptococcus species NOT DETECTED NOT DETECTED Final   Streptococcus agalactiae NOT DETECTED NOT DETECTED Final   Streptococcus pneumoniae NOT DETECTED NOT DETECTED Final   Streptococcus pyogenes NOT DETECTED NOT DETECTED Final   A.calcoaceticus-baumannii NOT DETECTED NOT DETECTED Final   Bacteroides fragilis NOT DETECTED NOT DETECTED Final   Enterobacterales DETECTED (A) NOT DETECTED Final    Comment: Enterobacterales represent a large order of gram negative bacteria, not a single organism. CRITICAL RESULT CALLED TO, READ BACK BY AND VERIFIED WITH: A. HAILE RN 09/03/22 @ 0547 BY AB    Enterobacter cloacae complex NOT DETECTED NOT DETECTED Final   Escherichia coli DETECTED (A) NOT DETECTED Final    Comment: CRITICAL RESULT CALLED TO, READ BACK BY AND VERIFIED WITH: A. HAILE RN 09/03/22 @ 0547 BY AB    Klebsiella aerogenes NOT DETECTED NOT DETECTED Final   Klebsiella oxytoca NOT DETECTED NOT DETECTED Final   Klebsiella pneumoniae NOT DETECTED NOT DETECTED Final   Proteus species NOT DETECTED NOT DETECTED Final   Salmonella species NOT DETECTED NOT DETECTED Final   Serratia marcescens NOT DETECTED NOT DETECTED Final   Haemophilus influenzae NOT DETECTED NOT DETECTED Final   Neisseria meningitidis NOT DETECTED NOT DETECTED Final   Pseudomonas aeruginosa NOT DETECTED NOT DETECTED Final   Stenotrophomonas maltophilia NOT DETECTED NOT DETECTED Final   Candida albicans NOT DETECTED NOT DETECTED Final   Candida auris NOT DETECTED NOT DETECTED Final   Candida glabrata NOT DETECTED NOT DETECTED Final   Candida krusei NOT DETECTED NOT DETECTED Final   Candida parapsilosis NOT DETECTED NOT DETECTED Final   Candida tropicalis NOT DETECTED NOT DETECTED Final   Cryptococcus neoformans/gattii NOT DETECTED NOT DETECTED Final   CTX-M ESBL DETECTED (A) NOT DETECTED Final    Comment: CRITICAL RESULT CALLED TO, READ BACK BY AND VERIFIED  WITH: A. HAILE RN 09/03/22 @ 0547 BY AB (NOTE) Extended spectrum beta-lactamase detected. Recommend a carbapenem as initial therapy.      Carbapenem resistance IMP NOT DETECTED NOT DETECTED Final   Carbapenem resistance KPC NOT DETECTED NOT DETECTED Final   Carbapenem resistance NDM NOT DETECTED NOT DETECTED Final  Carbapenem resist OXA 48 LIKE NOT DETECTED NOT DETECTED Final   Carbapenem resistance VIM NOT DETECTED NOT DETECTED Final    Comment: Performed at [facility], [address] [place], [place] [address]  Respiratory (~20 pathogens) panel by PCR     Status: None   Collection Time: 09/02/22  3:32 PM   Specimen: Nasopharyngeal Swab; Respiratory  Result Value Ref Range Status   Adenovirus NOT DETECTED NOT DETECTED Final   Coronavirus 229E NOT DETECTED NOT DETECTED Final    Comment: (NOTE) The Coronavirus on the Respiratory Panel, DOES NOT test for the novel  Coronavirus (2019 nCoV)    Coronavirus HKU1 NOT DETECTED NOT DETECTED Final   Coronavirus NL63 NOT DETECTED NOT DETECTED Final   Coronavirus OC43 NOT DETECTED NOT DETECTED Final   Metapneumovirus NOT DETECTED NOT DETECTED Final   Rhinovirus / Enterovirus NOT DETECTED NOT DETECTED Final   Influenza A NOT DETECTED NOT DETECTED Final   Influenza B NOT DETECTED NOT DETECTED Final   Parainfluenza Virus 1 NOT DETECTED NOT DETECTED Final   Parainfluenza Virus 2 NOT DETECTED NOT DETECTED Final   Parainfluenza Virus 3 NOT DETECTED NOT DETECTED Final   Parainfluenza Virus 4 NOT DETECTED NOT DETECTED Final   Respiratory Syncytial Virus NOT DETECTED NOT DETECTED Final   Bordetella pertussis NOT DETECTED NOT DETECTED Final   Bordetella Parapertussis NOT DETECTED NOT DETECTED Final   Chlamydophila pneumoniae NOT DETECTED NOT DETECTED Final   Mycoplasma pneumoniae NOT DETECTED NOT DETECTED Final    Comment: Performed at [facility], [address], [place], [place] [address]  SARS Coronavirus 2 by RT PCR (hospital  order, performed in [facility] hospital lab) *cepheid single result test* Anterior Nasal Swab     Status: None   Collection Time: 09/02/22  3:32 PM   Specimen: Anterior Nasal Swab  Result Value Ref Range Status   SARS Coronavirus 2 by RT PCR NEGATIVE NEGATIVE Final    Comment: (NOTE) SARS-CoV-2 target nucleic acids are NOT DETECTED.  The SARS-CoV-2 RNA is generally detectable in upper and lower respiratory specimens during the acute phase of infection. The lowest concentration of SARS-CoV-2 viral copies this assay can detect is 250 copies / mL. A negative result does not preclude SARS-CoV-2 infection and should not be used as the sole basis for treatment or other patient management decisions.  A negative result may occur with improper specimen collection / handling, submission of specimen other than nasopharyngeal swab, presence of viral mutation(s) within the areas targeted by this assay, and inadequate number of viral copies (<250 copies / mL). A negative result must be combined with clinical observations, patient history, and epidemiological information.  Fact Sheet for Patients:   RoadLapTop.co.za  Fact Sheet for Healthcare Providers: http://kim-miller.com/  This test is not yet approved or  cleared by the [place] FDA and has been authorized for detection and/or diagnosis of SARS-CoV-2 by FDA under an Emergency Use Authorization (EUA).  This EUA will remain in effect (meaning this test can be used) for the duration of the COVID-19 declaration under Section 564(b)(1) of the Act, 21 U.S.C. section 360bbb-3(b)(1), unless the authorization is terminated or revoked sooner.  Performed at [facility], [address] [place], [place] [address]   MRSA Next Gen by PCR, Nasal     Status: None   Collection Time: 09/02/22  6:27 PM   Specimen: Nasal Mucosa; Nasal Swab  Result Value Ref Range Status   MRSA by PCR Next Gen  NOT DETECTED  NOT DETECTED Final    Comment: (NOTE) The GeneXpert MRSA Assay (FDA approved for NASAL specimens only), is one component of a comprehensive MRSA colonization surveillance program. It is not intended to diagnose MRSA infection nor to guide or monitor treatment for MRSA infections. Test performance is not FDA approved in patients less than 42 years old. Performed at [facility], [address] [place], [place] [address]   Gastrointestinal Panel by PCR , Stool     Status: None   Collection Time: 09/06/22  5:55 PM   Specimen: STOOL  Result Value Ref Range Status   Campylobacter species NOT DETECTED NOT DETECTED Final   Plesimonas shigelloides NOT DETECTED NOT DETECTED Final   Salmonella species NOT DETECTED NOT DETECTED Final   Yersinia enterocolitica NOT DETECTED NOT DETECTED Final   Vibrio species NOT DETECTED NOT DETECTED Final   Vibrio cholerae NOT DETECTED NOT DETECTED Final   Enteroaggregative E coli (EAEC) NOT DETECTED NOT DETECTED Final   Enteropathogenic E coli (EPEC) NOT DETECTED NOT DETECTED Final   Enterotoxigenic E coli (ETEC) NOT DETECTED NOT DETECTED Final   Shiga like toxin producing E coli (STEC) NOT DETECTED NOT DETECTED Final   Shigella/Enteroinvasive E coli (EIEC) NOT DETECTED NOT DETECTED Final   Cryptosporidium NOT DETECTED NOT DETECTED Final   Cyclospora cayetanensis NOT DETECTED NOT DETECTED Final   Entamoeba histolytica NOT DETECTED NOT DETECTED Final   Giardia lamblia NOT DETECTED NOT DETECTED Final   Adenovirus F40/41 NOT DETECTED NOT DETECTED Final   Astrovirus NOT DETECTED NOT DETECTED Final   Norovirus GI/GII NOT DETECTED NOT DETECTED Final   Rotavirus A NOT DETECTED NOT DETECTED Final   Sapovirus (I, II, IV, and V) NOT DETECTED NOT DETECTED Final    Comment: Performed at [facility], [address] [place], [place] [address]  C Difficile Quick Screen w PCR reflex     Status: None   Collection Time: 09/06/22  5:55 PM   Specimen: STOOL   Result Value Ref Range Status   C Diff antigen NEGATIVE NEGATIVE Final   C Diff toxin NEGATIVE NEGATIVE Final   C Diff interpretation No C. difficile detected.  Final    Comment: Performed at [facility], [address], [place], [place] [address]     Labs: BNP (last 3 results) Recent Labs    09/04/22 1922 09/06/22 0418 09/07/22 0402  BNP 2,180.0* 1,744.0* 1,443.0*   Basic Metabolic Panel: Recent Labs  Lab 09/03/22 0450 09/04/22 0403 09/05/22 0358 09/06/22 0418 09/07/22 0402 09/08/22 0410  NA 139 130* 131* 136 133* 136  K 3.4* 3.7 3.2* 3.2* 3.5 3.3*  CL 110 102 98 103 95* 97*  CO2 20* 19* 22 23 28  32  GLUCOSE 127* 88 93 83 86 112*  BUN 11 10 7 9 6 6   CREATININE 0.80 0.78 0.81 0.66 0.68 0.57  CALCIUM 6.1* 6.5* 7.4* 7.3* 7.4* 7.4*  MG 1.1* 1.7 1.8  --  1.3* 1.7   Liver Function Tests: Recent Labs  Lab 09/04/22 0403 09/05/22 0358 09/06/22 0418 09/07/22 0402 09/08/22 0410  AST 73* 157* 86* 95* 64*  ALT 34 61* 46* 51* 39  ALKPHOS 148* 185* 123 122 103  BILITOT 0.5 0.8 0.7 0.8 0.7  PROT 6.3* 7.4 5.9* 7.0 6.3*  ALBUMIN 1.7* 2.0* 1.6* 1.9* 1.6*   No results for input(s): "LIPASE", "AMYLASE" in the last 168 hours. No results for input(s): "AMMONIA" in the last 168 hours. CBC: Recent Labs  Lab 09/04/22  0403 09/04/22 0656 09/05/22 0358 09/06/22 0418 09/07/22 0526 09/08/22 0410  WBC 17.2*  --  17.8* 17.2* 12.5* 7.7  HGB 6.6* 6.5* 12.4 12.5 10.7* 10.5*  HCT 22.5* 21.6* 37.9 40.1 34.4* 34.5*  MCV 84.0  --  82.6 84.1 85.1 85.6  PLT 237  --  247 281 300 326   Cardiac Enzymes: No results for input(s): "CKTOTAL", "CKMB", "CKMBINDEX", "TROPONINI" in the last 168 hours. BNP: Invalid input(s): "POCBNP" CBG: No results for input(s): "GLUCAP" in the last 168 hours. D-Dimer No results for input(s): "DDIMER" in the last 72 hours. Hgb A1c No results for input(s): "HGBA1C" in the last 72 hours. Lipid Profile No results for input(s): "CHOL", "HDL", "LDLCALC",  "TRIG", "CHOLHDL", "LDLDIRECT" in the last 72 hours. Thyroid function studies No results for input(s): "TSH", "T4TOTAL", "T3FREE", "THYROIDAB" in the last 72 hours.  Invalid input(s): "FREET3" Anemia work up No results for input(s): "VITAMINB12", "FOLATE", "FERRITIN", "TIBC", "IRON", "RETICCTPCT" in the last 72 hours. Urinalysis    Component Value Date/Time   COLORURINE YELLOW 09/02/2022 1754   APPEARANCEUR HAZY (A) 09/02/2022 1754   LABSPEC 1.036 (H) 09/02/2022 1754   PHURINE 5.0 09/02/2022 1754   GLUCOSEU NEGATIVE 09/02/2022 1754   HGBUR SMALL (A) 09/02/2022 1754   BILIRUBINUR NEGATIVE 09/02/2022 1754   KETONESUR NEGATIVE 09/02/2022 1754   PROTEINUR 100 (A) 09/02/2022 1754   UROBILINOGEN 0.2 03/24/2013 1422   NITRITE POSITIVE (A) 09/02/2022 1754   LEUKOCYTESUR SMALL (A) 09/02/2022 1754   Sepsis Labs Recent Labs  Lab 09/05/22 0358 09/06/22 0418 09/07/22 0526 09/08/22 0410  WBC 17.8* 17.2* 12.5* 7.7   Microbiology Recent Results (from the past 240 hour(s))  Culture, blood (routine x 2)     Status: None   Collection Time: 09/02/22  2:57 PM   Specimen: Blood  Result Value Ref Range Status   Specimen Description BOTTLES DRAWN AEROBIC ONLY BLOOD RIGHT HAND  Final   Special Requests Blood Culture adequate volume  Final   Culture   Final    NO GROWTH 5 DAYS Performed at [facility], [address] [place], [place] [address]    Report Status 09/07/2022 FINAL  Final  Culture, blood (routine x 2)     Status: Abnormal   Collection Time: 09/02/22  2:57 PM   Specimen: Left Antecubital; Blood  Result Value Ref Range Status   Specimen Description   Final    LEFT ANTECUBITAL BOTTLES DRAWN AEROBIC AND ANAEROBIC Performed at [facility], [address], [place], [place] [address]    Special Requests   Final    Blood Culture adequate volume Performed at [facility], [address] [place], [place] [address]    Culture  Setup Time   Final    GRAM NEGATIVE RODS Gram Stain  Report Called to,Read Back By and Verified With: HAILE,A @ 0239 ON 09/03/22 BY JUW ANAEROBIC BOTTLE ONLY GS DONE @ [facility] CRITICAL RESULT CALLED TO, READ BACK BY AND VERIFIED WITH: A. HAILE RN 09/03/22 @ 0547 BY AB Performed at [facility], [address] [place], [place] [address]    Culture (A)  Final    ESCHERICHIA COLI Confirmed Extended Spectrum Beta-Lactamase Producer (ESBL).  In bloodstream infections from ESBL organisms, carbapenems are preferred over piperacillin/tazobactam. They are shown to have a lower risk of mortality.    Report Status 09/05/2022 FINAL  Final   Organism ID, Bacteria ESCHERICHIA COLI  Final      Susceptibility   Escherichia coli - MIC*  AMPICILLIN >=32 RESISTANT Resistant     CEFEPIME 16 RESISTANT Resistant     CEFTAZIDIME RESISTANT Resistant     CEFTRIAXONE >=64 RESISTANT Resistant     CIPROFLOXACIN >=4 RESISTANT Resistant     GENTAMICIN >=16 RESISTANT Resistant     IMIPENEM <=0.25 SENSITIVE Sensitive     TRIMETH/SULFA >=320 RESISTANT Resistant     AMPICILLIN/SULBACTAM >=32 RESISTANT Resistant     PIP/TAZO <=4 SENSITIVE Sensitive     * ESCHERICHIA COLI  Blood Culture ID Panel (Reflexed)     Status: Abnormal   Collection Time: 09/02/22  2:57 PM  Result Value Ref Range Status   Enterococcus faecalis NOT DETECTED NOT DETECTED Final   Enterococcus Faecium NOT DETECTED NOT DETECTED Final   Listeria monocytogenes NOT DETECTED NOT DETECTED Final   Staphylococcus species NOT DETECTED NOT DETECTED Final   Staphylococcus aureus (BCID) NOT DETECTED NOT DETECTED Final   Staphylococcus epidermidis NOT DETECTED NOT DETECTED Final   Staphylococcus lugdunensis NOT DETECTED NOT DETECTED Final   Streptococcus species NOT DETECTED NOT DETECTED Final   Streptococcus agalactiae NOT DETECTED NOT DETECTED Final   Streptococcus pneumoniae NOT DETECTED NOT DETECTED Final   Streptococcus pyogenes NOT DETECTED NOT DETECTED Final   A.calcoaceticus-baumannii NOT  DETECTED NOT DETECTED Final   Bacteroides fragilis NOT DETECTED NOT DETECTED Final   Enterobacterales DETECTED (A) NOT DETECTED Final    Comment: Enterobacterales represent a large order of gram negative bacteria, not a single organism. CRITICAL RESULT CALLED TO, READ BACK BY AND VERIFIED WITH: A. HAILE RN 09/03/22 @ 0547 BY AB    Enterobacter cloacae complex NOT DETECTED NOT DETECTED Final   Escherichia coli DETECTED (A) NOT DETECTED Final    Comment: CRITICAL RESULT CALLED TO, READ BACK BY AND VERIFIED WITH: A. HAILE RN 09/03/22 @ 0547 BY AB    Klebsiella aerogenes NOT DETECTED NOT DETECTED Final   Klebsiella oxytoca NOT DETECTED NOT DETECTED Final   Klebsiella pneumoniae NOT DETECTED NOT DETECTED Final   Proteus species NOT DETECTED NOT DETECTED Final   Salmonella species NOT DETECTED NOT DETECTED Final   Serratia marcescens NOT DETECTED NOT DETECTED Final   Haemophilus influenzae NOT DETECTED NOT DETECTED Final   Neisseria meningitidis NOT DETECTED NOT DETECTED Final   Pseudomonas aeruginosa NOT DETECTED NOT DETECTED Final   Stenotrophomonas maltophilia NOT DETECTED NOT DETECTED Final   Candida albicans NOT DETECTED NOT DETECTED Final   Candida auris NOT DETECTED NOT DETECTED Final   Candida glabrata NOT DETECTED NOT DETECTED Final   Candida krusei NOT DETECTED NOT DETECTED Final   Candida parapsilosis NOT DETECTED NOT DETECTED Final   Candida tropicalis NOT DETECTED NOT DETECTED Final   Cryptococcus neoformans/gattii NOT DETECTED NOT DETECTED Final   CTX-M ESBL DETECTED (A) NOT DETECTED Final    Comment: CRITICAL RESULT CALLED TO, READ BACK BY AND VERIFIED WITH: A. HAILE RN 09/03/22 @ 0547 BY AB (NOTE) Extended spectrum beta-lactamase detected. Recommend a carbapenem as initial therapy.      Carbapenem resistance IMP NOT DETECTED NOT DETECTED Final   Carbapenem resistance KPC NOT DETECTED NOT DETECTED Final   Carbapenem resistance NDM NOT DETECTED NOT DETECTED Final    Carbapenem resist OXA 48 LIKE NOT DETECTED NOT DETECTED Final   Carbapenem resistance VIM NOT DETECTED NOT DETECTED Final    Comment: Performed at [facility], [address] [place], [place] [address]  Respiratory (~20 pathogens) panel by PCR     Status: None   Collection Time: 09/02/22  3:32  PM   Specimen: Nasopharyngeal Swab; Respiratory  Result Value Ref Range Status   Adenovirus NOT DETECTED NOT DETECTED Final   Coronavirus 229E NOT DETECTED NOT DETECTED Final    Comment: (NOTE) The Coronavirus on the Respiratory Panel, DOES NOT test for the novel  Coronavirus (2019 nCoV)    Coronavirus HKU1 NOT DETECTED NOT DETECTED Final   Coronavirus NL63 NOT DETECTED NOT DETECTED Final   Coronavirus OC43 NOT DETECTED NOT DETECTED Final   Metapneumovirus NOT DETECTED NOT DETECTED Final   Rhinovirus / Enterovirus NOT DETECTED NOT DETECTED Final   Influenza A NOT DETECTED NOT DETECTED Final   Influenza B NOT DETECTED NOT DETECTED Final   Parainfluenza Virus 1 NOT DETECTED NOT DETECTED Final   Parainfluenza Virus 2 NOT DETECTED NOT DETECTED Final   Parainfluenza Virus 3 NOT DETECTED NOT DETECTED Final   Parainfluenza Virus 4 NOT DETECTED NOT DETECTED Final   Respiratory Syncytial Virus NOT DETECTED NOT DETECTED Final   Bordetella pertussis NOT DETECTED NOT DETECTED Final   Bordetella Parapertussis NOT DETECTED NOT DETECTED Final   Chlamydophila pneumoniae NOT DETECTED NOT DETECTED Final   Mycoplasma pneumoniae NOT DETECTED NOT DETECTED Final    Comment: Performed at [facility], [address] [place], [place] [address]  SARS Coronavirus 2 by RT PCR (hospital order, performed in [facility] hospital lab) *cepheid single result test* Anterior Nasal Swab     Status: None   Collection Time: 09/02/22  3:32 PM   Specimen: Anterior Nasal Swab  Result Value Ref Range Status   SARS Coronavirus 2 by RT PCR NEGATIVE NEGATIVE Final    Comment: (NOTE) SARS-CoV-2 target nucleic acids are  NOT DETECTED.  The SARS-CoV-2 RNA is generally detectable in upper and lower respiratory specimens during the acute phase of infection. The lowest concentration of SARS-CoV-2 viral copies this assay can detect is 250 copies / mL. A negative result does not preclude SARS-CoV-2 infection and should not be used as the sole basis for treatment or other patient management decisions.  A negative result may occur with improper specimen collection / handling, submission of specimen other than nasopharyngeal swab, presence of viral mutation(s) within the areas targeted by this assay, and inadequate number of viral copies (<250 copies / mL). A negative result must be combined with clinical observations, patient history, and epidemiological information.  Fact Sheet for Patients:   RoadLapTop.co.za  Fact Sheet for Healthcare Providers: http://kim-miller.com/  This test is not yet approved or  cleared by the [place] FDA and has been authorized for detection and/or diagnosis of SARS-CoV-2 by FDA under an Emergency Use Authorization (EUA).  This EUA will remain in effect (meaning this test can be used) for the duration of the COVID-19 declaration under Section 564(b)(1) of the Act, 21 U.S.C. section 360bbb-3(b)(1), unless the authorization is terminated or revoked sooner.  Performed at [facility], [address] [place], [place] [address]   MRSA Next Gen by PCR, Nasal     Status: None   Collection Time: 09/02/22  6:27 PM   Specimen: Nasal Mucosa; Nasal Swab  Result Value Ref Range Status   MRSA by PCR Next Gen NOT DETECTED NOT DETECTED Final    Comment: (NOTE) The GeneXpert MRSA Assay (FDA approved for NASAL specimens only), is one component of a comprehensive MRSA colonization surveillance program. It is not intended to diagnose MRSA infection nor to guide or monitor treatment for MRSA infections. Test performance is not FDA approved in  patients less  than 2 years old. Performed at [facility], [address], [place], [place] [address]   Gastrointestinal Panel by PCR , Stool     Status: None   Collection Time: 09/06/22  5:55 PM   Specimen: STOOL  Result Value Ref Range Status   Campylobacter species NOT DETECTED NOT DETECTED Final   Plesimonas shigelloides NOT DETECTED NOT DETECTED Final   Salmonella species NOT DETECTED NOT DETECTED Final   Yersinia enterocolitica NOT DETECTED NOT DETECTED Final   Vibrio species NOT DETECTED NOT DETECTED Final   Vibrio cholerae NOT DETECTED NOT DETECTED Final   Enteroaggregative E coli (EAEC) NOT DETECTED NOT DETECTED Final   Enteropathogenic E coli (EPEC) NOT DETECTED NOT DETECTED Final   Enterotoxigenic E coli (ETEC) NOT DETECTED NOT DETECTED Final   Shiga like toxin producing E coli (STEC) NOT DETECTED NOT DETECTED Final   Shigella/Enteroinvasive E coli (EIEC) NOT DETECTED NOT DETECTED Final   Cryptosporidium NOT DETECTED NOT DETECTED Final   Cyclospora cayetanensis NOT DETECTED NOT DETECTED Final   Entamoeba histolytica NOT DETECTED NOT DETECTED Final   Giardia lamblia NOT DETECTED NOT DETECTED Final   Adenovirus F40/41 NOT DETECTED NOT DETECTED Final   Astrovirus NOT DETECTED NOT DETECTED Final   Norovirus GI/GII NOT DETECTED NOT DETECTED Final   Rotavirus A NOT DETECTED NOT DETECTED Final   Sapovirus (I, II, IV, and V) NOT DETECTED NOT DETECTED Final    Comment: Performed at [facility], [address] [place], [place] [address]  C Difficile Quick Screen w PCR reflex     Status: None   Collection Time: 09/06/22  5:55 PM   Specimen: STOOL  Result Value Ref Range Status   C Diff antigen NEGATIVE NEGATIVE Final   C Diff toxin NEGATIVE NEGATIVE Final   C Diff interpretation No C. difficile detected.  Final    Comment: Performed at [facility], [address], [place], [place] [address]    Time coordinating discharge: 50 mins   SIGNED:  Standley Dakins,  MD  [facility] 09/09/2022, 3:00 PM How to contact the [facility] Attending or Consulting provider 7A - 7P or covering provider during after hours 7P -7A, for this patient?  Check the care team in [facility] and look for a) attending/consulting [facility] provider listed and b) the [facility] team listed Log into [email or web address] and use [place]'s universal password to access. If you do not have the password, please contact the hospital operator. Locate the [facility] provider you are looking for under [facility] and page to a number that you can be directly reached. If you still have difficulty reaching the provider, please page the [facility] (Director on Call) for the Hospitalists listed on amion for assistance.

## 2022-09-09 NOTE — TOC Transition Note (Signed)
Transition of Care [facility]) - CM/SW Discharge Note   Patient Details  Name: [name] MRN: [id number] Date of Birth: 1971/02/20  [facility] CM/SW Contact:  Catalina Gravel, LCSW Phone Number: 09/09/2022, 4:35 PM   Clinical Narrative:    CSW met with pt and spouse at bedside. Delivered 02 tank, advised [employer] will be out today with more education and set up. CSW worked with [employer] to confirm IV antibiotics, tomorrow morning.  Pharmacy scheduled dose today prior to DC. CSW provided spouse with the cell number of [employer] driver.  He is about 50 minutes away.  DC today with services.       Final next level of care: Home w Home Health Services Barriers to Discharge: No Barriers Identified   Patient Goals and CMS Choice      Discharge Placement                         Discharge Plan and Services Additional resources added to the After Visit Summary for   In-house Referral: Clinical Social Work   Post Acute Care Choice: Home Health          DME Arranged: Oxygen DME Agency: Patsy Lager Date DME Agency Contacted: 09/09/22 Time DME Agency Contacted: [phone] Representative spoke with at DME Agency: Morrie Sheldon HH Arranged: RN, IV Antibiotics HH Agency: [facility] Date [facility] Agency Contacted: 09/09/22 Time HH Agency Contacted: [phone] Representative spoke with at [facility] Agency: Pam  Social Determinants of Health (SDOH) Interventions SDOH Screenings   Food Insecurity: No Food Insecurity (03/19/2017)  Transportation Needs: No Transportation Needs (03/19/2017)  Depression (PHQ2-9): Low Risk  (11/29/2020)  Financial Resource Strain: Low Risk  (03/19/2017)  Physical Activity: Inactive (03/19/2017)  Social Connections: Somewhat Isolated (03/19/2017)  Stress: Stress Concern Present (03/19/2017)  Tobacco Use: High Risk (09/09/2022)     Readmission Risk Interventions     No data to display

## 2022-09-10 ENCOUNTER — Other Ambulatory Visit: Payer: Self-pay | Admitting: Gastroenterology

## 2022-09-10 DIAGNOSIS — R112 Nausea with vomiting, unspecified: Secondary | ICD-10-CM

## 2022-09-10 DIAGNOSIS — R6520 Severe sepsis without septic shock: Secondary | ICD-10-CM | POA: Diagnosis not present

## 2022-09-10 DIAGNOSIS — N10 Acute tubulo-interstitial nephritis: Secondary | ICD-10-CM | POA: Diagnosis not present

## 2022-09-10 DIAGNOSIS — A4151 Sepsis due to Escherichia coli [E. coli]: Secondary | ICD-10-CM | POA: Diagnosis not present

## 2022-09-11 ENCOUNTER — Other Ambulatory Visit: Payer: Self-pay | Admitting: Gastroenterology

## 2022-09-11 DIAGNOSIS — R112 Nausea with vomiting, unspecified: Secondary | ICD-10-CM

## 2022-09-11 NOTE — Progress Notes (Signed)
Patient called several days after discharge stating she could not get her medication without prior authorization; notified case worker for her.

## 2022-09-13 DIAGNOSIS — J449 Chronic obstructive pulmonary disease, unspecified: Secondary | ICD-10-CM | POA: Diagnosis not present

## 2022-09-14 DIAGNOSIS — A4151 Sepsis due to Escherichia coli [E. coli]: Secondary | ICD-10-CM | POA: Diagnosis not present

## 2022-09-17 ENCOUNTER — Ambulatory Visit (HOSPITAL_COMMUNITY): Payer: Medicaid Other

## 2022-09-20 DIAGNOSIS — N181 Chronic kidney disease, stage 1: Secondary | ICD-10-CM | POA: Diagnosis not present

## 2022-09-20 DIAGNOSIS — E876 Hypokalemia: Secondary | ICD-10-CM | POA: Diagnosis not present

## 2022-09-20 DIAGNOSIS — I129 Hypertensive chronic kidney disease with stage 1 through stage 4 chronic kidney disease, or unspecified chronic kidney disease: Secondary | ICD-10-CM | POA: Diagnosis not present

## 2022-09-20 DIAGNOSIS — I5040 Unspecified combined systolic (congestive) and diastolic (congestive) heart failure: Secondary | ICD-10-CM | POA: Diagnosis not present

## 2022-09-24 ENCOUNTER — Ambulatory Visit: Payer: Medicaid Other | Attending: Nurse Practitioner | Admitting: Nurse Practitioner

## 2022-09-24 ENCOUNTER — Encounter: Payer: Self-pay | Admitting: Nurse Practitioner

## 2022-09-24 VITALS — BP 110/60 | HR 66 | Ht 61.0 in | Wt 116.4 lb

## 2022-09-24 DIAGNOSIS — I10 Essential (primary) hypertension: Secondary | ICD-10-CM

## 2022-09-24 DIAGNOSIS — D649 Anemia, unspecified: Secondary | ICD-10-CM | POA: Diagnosis not present

## 2022-09-24 DIAGNOSIS — N281 Cyst of kidney, acquired: Secondary | ICD-10-CM | POA: Diagnosis not present

## 2022-09-24 DIAGNOSIS — I428 Other cardiomyopathies: Secondary | ICD-10-CM

## 2022-09-24 DIAGNOSIS — Z720 Tobacco use: Secondary | ICD-10-CM | POA: Diagnosis not present

## 2022-09-24 DIAGNOSIS — Z79899 Other long term (current) drug therapy: Secondary | ICD-10-CM

## 2022-09-24 MED ORDER — SACUBITRIL-VALSARTAN 24-26 MG PO TABS: 1.0000 | ORAL_TABLET | Freq: Two times a day (BID) | ORAL | 6 refills | Status: DC

## 2022-09-24 MED ORDER — SPIRONOLACTONE 25 MG PO TABS: 12.5000 mg | ORAL_TABLET | Freq: Every day | ORAL | 1 refills | Status: DC

## 2022-09-24 MED ORDER — FUROSEMIDE 40 MG PO TABS: 40.0000 mg | ORAL_TABLET | Freq: Every day | ORAL | 1 refills | Status: DC | PRN

## 2022-09-24 MED ORDER — POTASSIUM CHLORIDE CRYS ER 20 MEQ PO TBCR: 20.0000 meq | EXTENDED_RELEASE_TABLET | Freq: Every day | ORAL | 1 refills | Status: DC | PRN

## 2022-09-24 NOTE — Patient Instructions (Addendum)
Medication Instructions:  Your physician has recommended you make the following change in your medication:  Change furosemide 40 mg to daily as needed Change potassium 20 meq to daily as needed Start spironolactone 12.5 mg daily Continue other medications the same  Labwork: BMET, Mag, CBC, BNP-please do within the next week [facility] Non-fasting  Testing/Procedures: Limited Echocardiogram  Follow-Up: Your physician recommends that you schedule a follow-up appointment in: 6-8 weeks  Any Other Special Instructions Will Be Listed Below (If Applicable). Your physician recommends that you weigh, daily, at the same time every day, and in the same amount of clothing. Please record your daily weights on the handout provided and bring it to your next appointment.   If you need a refill on your cardiac medications before your next appointment, please call your pharmacy.

## 2022-09-24 NOTE — Progress Notes (Unsigned)
Office Visit    Patient Name: [name] Date of Encounter: 09/24/2022  PCP:  Smith Robert, MD   [facility]  Cardiologist:  Marjo Bicker, MD  Advanced Practice Provider:  No care team member to display Electrophysiologist:  None 6}  Chief Complaint    [name] is a 52 y.o. 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  Past Medical History    Past Medical History:  Diagnosis Date   Chronic abdominal pain    Chronic back pain    Colitis    Constipation    GERD (gastroesophageal reflux disease)    Hypertension    Migraine    Plumbism    blood clot    PONV (postoperative nausea and vomiting)    Renal mass    Uterine cancer [facility])    age 45   Past Surgical History:  Procedure Laterality Date   ABDOMINAL HYSTERECTOMY     BACK SURGERY     BIOPSY  07/07/2021   Procedure: BIOPSY;  Surgeon: Lanelle Bal, DO;  Location: [facility];  Service: Endoscopy;;   CHOLECYSTECTOMY     Complicated by bile leak requiring ERCP with temporary stenting   COLONOSCOPY WITH ESOPHAGOGASTRODUODENOSCOPY (EGD)  07/2009   [facility]: Op notes cannot be seen through care everywhere however pathology showed terminal ileum and random colon biopsies normal.  Stomach biopsy with gastric antral and fundic mucosa with reactive foveolar hyperplasia.  No active gastritis.  Stains negative for H. pylori.   COLONOSCOPY WITH PROPOFOL N/A 07/07/2021   Surgeon: Earnest Bailey K, DO;  Non-bleeding internal hemorrhoids, normal TI  biopsied, normal colon biopsied. Pathology with colon and TI biopsies normal.   ESOPHAGEAL BRUSHING  07/07/2021   Procedure: ESOPHAGEAL BRUSHING;  Surgeon: Lanelle Bal, DO;  Location: [facility];  Service: Endoscopy;;   ESOPHAGOGASTRODUODENOSCOPY  06/2007   [facility]: Normal esophagus,  gastric mucosal abnormality with erythema, biopsy showed gastric antral and fundic mucosa with reactive foveolar hyperplasia, no gastritis, no H. pylori.   ESOPHAGOGASTRODUODENOSCOPY (EGD) WITH PROPOFOL N/A 07/07/2021   Surgeon: Lanelle Bal, DO; moderately severe Candida esophagitis, gastritis, biopsies negative for H. pylori, normal examined duodenum.  Prescribed Diflucan and recommended PPI twice daily.   HAND SURGERY      Allergies  Allergies  Allergen Reactions   Buprenorphine Other (See Comments)    Chest pain/ringing in ears and feet swelling   Penicillins Swelling and Rash        Aripiprazole Other (See Comments)    seizure   Seroquel [Quetiapine] Other (See Comments)    Seizure (08/18/20)   Cefaclor Other (See Comments)    Muscles locked up   Doxycycline Rash   Gabapentin Nausea And Vomiting    dizzy   Ibuprofen Swelling and Rash   Levofloxacin Rash    Itching   Lisinopril Nausea And Vomiting   Macrobid [Nitrofurantoin Monohyd Macro] Rash   Naproxen Swelling and Rash    History of Present Illness    [name] is a 52 y.o. female with a PMH as mentioned above.  Admitted 09/02/2022 - 09/09/2022 for severe sepsis due to UTI, E. coli bacteremia, pyelonephritis, colonic wall thickening/colitis, and lobar pneumonia.  Was noted to be in new onset cardiomyopathy, EF 25 to 30% with LV RWMA's.  Hospital course complicated by acute pulmonary edema,  treated with diuretics, as well as acute metabolic encephalopathy. Was found to be acutely anemic, FOBT was negative.  Hemoglobin dropped down to 6.6, transfused 2 units PRBC.  She was started on GDMT by cardiology.  Presents today for office evaluation with her sister-in-law. Says she is getting better day by day. Has some good days and some not so good days. Denies any chest pain, shortness of breath, palpitations, syncope, presyncope, dizziness, orthopnea, PND, swelling or significant weight changes, acute bleeding, or  claudication. Did receive IV ABX x 3 days through PICC line after returning home. Home Health RN did take this out when it was completed.   EKGs/Labs/Other Studies Reviewed:   The following studies were reviewed today:   EKG:  EKG is not ordered today.     Echo 08/2022:  1. Left ventricular ejection fraction, by estimation, is 25 to 30%. The  left ventricle has severely decreased function. The left ventricle  demonstrates regional wall motion abnormalities (see scoring  diagram/findings for description). Left ventricular  diastolic parameters are consistent with Grade II diastolic dysfunction  (pseudonormalization).   2. Right ventricular systolic function is moderately reduced. The right  ventricular size is mildly enlarged. There is mildly elevated pulmonary  artery systolic pressure.   3. Left atrial size was mild to moderately dilated.   4. A small pericardial effusion is present. The pericardial effusion is  circumferential.   5. The mitral valve is normal in structure. No evidence of mitral valve  regurgitation. No evidence of mitral stenosis.   6. The aortic valve was not well visualized. Aortic valve regurgitation  is not visualized. No aortic stenosis is present.   7. The inferior vena cava is normal in size with <50% respiratory  variability, suggesting right atrial pressure of 8 mmHg.   Comparison(s): No prior Echocardiogram.   Recent Labs: 09/07/2022: B Natriuretic Peptide 1,443.0 09/08/2022: ALT 39; BUN 6; Creatinine, Ser 0.57; Hemoglobin 10.5; Magnesium 1.7; Platelets 326; Potassium 3.3; Sodium 136  Recent Lipid Panel No results found for: "CHOL", "TRIG", "HDL", "CHOLHDL", "VLDL", "LDLCALC", "LDLDIRECT"  Home Medications   Current Meds  Medication Sig   acetaminophen (TYLENOL) 500 MG tablet Take 500 mg by mouth every 8 (eight) hours as needed.   albuterol (VENTOLIN HFA) 108 (90 Base) MCG/ACT inhaler Inhale 2 puffs into the lungs every 4 (four) hours as needed for  shortness of breath or wheezing.   famotidine (PEPCID) 20 MG tablet Take 20 mg by mouth daily. For break through heart burn   FLUoxetine (PROZAC) 40 MG capsule Take 40 mg by mouth daily.   furosemide (LASIX) 40 MG tablet Take 1 tablet (40 mg total) by mouth daily.   hydrOXYzine (ATARAX/VISTARIL) 50 MG tablet Take 50 mg by mouth every 6 (six) hours as needed for anxiety.   ipratropium-albuterol (DUONEB) 0.5-2.5 (3) MG/3ML SOLN Take 3 mLs by nebulization every 4 (four) hours as needed (SOB, wheezing, coughing).   metoprolol succinate (TOPROL-XL) 100 MG 24 hr tablet Take 100 mg by mouth daily. Take with or immediately following a meal.   NICODERM CQ 21 MG/24HR patch Place 21 mg onto the skin daily.   nitroGLYCERIN (NITROSTAT) 0.4 MG SL tablet Place 1 tablet (0.4 mg total) under the tongue every 5 (five) minutes as needed for chest pain. (Patient taking differently: Place 0.4 mg under the tongue every 5 (five) minutes x 3 doses as needed for chest pain (if no relief after 3rd dose, proceed to ED or call 911).)   ondansetron (  ZOFRAN-ODT) 8 MG disintegrating tablet TAKE ONE TABLET BY MOUTH EVERY 8 HOURS   pantoprazole (PROTONIX) 40 MG tablet TAKE (1) TABLET BY MOUTH TWICE A DAY BEFORE MEALS. (BREAKFAST AND SUPPER)   potassium chloride SA (KLOR-CON M) 20 MEQ tablet Take 1 tablet (20 mEq total) by mouth daily.   promethazine (PHENERGAN) 25 MG tablet TAKE ONE-HALF TABLET BY MOUTH EVERY 6 HOURS AS NEEDED FOR NAUSEA AND VOMITING   SPIRIVA HANDIHALER 18 MCG inhalation capsule 1 capsule by inhaling the contents of the capsule using the HandiHaler device Inhalation Once a day for 30 days   temazepam (RESTORIL) 30 MG capsule Take 30 mg by mouth at bedtime as needed for sleep.   sacubitril-valsartan (ENTRESTO) 24-26 MG Take 1 tablet by mouth 2 (two) times daily.     Review of Systems    All other systems reviewed and are otherwise negative except as noted above.  Physical Exam    VS:  BP 110/60   Pulse 66    Ht 5\' 1"  (1.549 m)   Wt 116 lb 6.4 oz (52.8 kg)   SpO2 98%   BMI 21.99 kg/m  , BMI Body mass index is 21.99 kg/m.  Wt Readings from Last 3 Encounters:  09/24/22 116 lb 6.4 oz (52.8 kg)  09/09/22 125 lb 7.1 oz (56.9 kg)  08/23/22 120 lb (54.4 kg)     GEN: Thin, 52 y.o. female in no acute distress. HEENT: normal. Neck: Supple, no JVD, carotid bruits, or masses. Cardiac: S1/S2, RRR, no murmurs, rubs, or gallops. No clubbing, cyanosis, edema.  Radials/PT 2+ and equal bilaterally.  Respiratory:  Respirations regular and unlabored, clear to auscultation bilaterally. MS: No deformity or atrophy. Skin: Warm and dry, no rash. Neuro:  Strength and sensation are intact. Psych: Normal affect.  Assessment & Plan    Combined CHF/New onset cardiomyopathy/NICM, medication management In setting of acute sepsis. Most likely NICM. Stage C, class I-II symptoms. EF 25-30%. Euvolemic and well compensated on exam. Continue Entresto and Toprol XL. Change Lasix to 40 mg daily PRN for swelling along with Potassium PRN to be taken with Lasix. Will start Aldactone 12.5 mg daily. Will obtain Mag, BMET, and BNP in 1 week. Not a candidate for SGLT2i d/t recent sepsis. Low sodium diet, fluid restriction <2L, and daily weights encouraged. Educated to contact our office for weight gain of 2 lbs overnight or 5 lbs in one week. Will update limited Echo in 1 month to evaluate EF. If no improvement in EF, consider CCTA/ischemic evaluation.  Heart healthy diet and regular cardiovascular exercise encouraged.   HTN BP soft, asymptomatic. Continue Entresto, and will initiate low dose Aldactone as mentioned above while switching Lasix and Potassium to PRN. Discussed to monitor BP at home at least 2 hours after medications and sitting for 5-10 minutes. Heart healthy diet and regular cardiovascular exercise encouraged.   Tobacco abuse Smoking cessation encouraged and discussed.   Complex renal cyst Follows Dr. Wolfgang Phoenix.  Scheduled for MRI of abdomen later this month. Continue to follow-up with Nephrology.  Anemia  Received blood transfusion during past hospitalization. Will check CBC. Continue to follow with PCP.  Disposition: Follow up in 6-8 week(s) with Vishnu P Mallipeddi, MD or APP.  Signed, Sharlene Dory, NP 09/26/2022, 8:21 PM [facility]

## 2022-09-27 DIAGNOSIS — E785 Hyperlipidemia, unspecified: Secondary | ICD-10-CM | POA: Diagnosis not present

## 2022-09-27 DIAGNOSIS — I10 Essential (primary) hypertension: Secondary | ICD-10-CM | POA: Diagnosis not present

## 2022-09-27 DIAGNOSIS — Z09 Encounter for follow-up examination after completed treatment for conditions other than malignant neoplasm: Secondary | ICD-10-CM | POA: Diagnosis not present

## 2022-09-27 DIAGNOSIS — R911 Solitary pulmonary nodule: Secondary | ICD-10-CM | POA: Diagnosis not present

## 2022-09-30 ENCOUNTER — Other Ambulatory Visit: Payer: Self-pay | Admitting: Gastroenterology

## 2022-09-30 DIAGNOSIS — R112 Nausea with vomiting, unspecified: Secondary | ICD-10-CM

## 2022-10-01 DIAGNOSIS — F411 Generalized anxiety disorder: Secondary | ICD-10-CM | POA: Diagnosis not present

## 2022-10-01 DIAGNOSIS — F333 Major depressive disorder, recurrent, severe with psychotic symptoms: Secondary | ICD-10-CM | POA: Diagnosis not present

## 2022-10-02 ENCOUNTER — Ambulatory Visit: Payer: Medicaid Other | Attending: Nurse Practitioner

## 2022-10-02 DIAGNOSIS — I428 Other cardiomyopathies: Secondary | ICD-10-CM | POA: Diagnosis not present

## 2022-10-03 ENCOUNTER — Telehealth: Payer: Self-pay

## 2022-10-03 ENCOUNTER — Other Ambulatory Visit (HOSPITAL_COMMUNITY): Payer: Self-pay

## 2022-10-03 LAB — ECHOCARDIOGRAM LIMITED
Area-P 1/2: 3.93 cm2
Calc EF: 62.2 %
S' Lateral: 2.6 cm
Single Plane A2C EF: 63.9 %
Single Plane A4C EF: 60.5 %

## 2022-10-03 NOTE — Telephone Encounter (Signed)
Pharmacy Patient Advocate Encounter   Received notification from [facility] MEDICAID that prior authorization for ENTRESTO is needed.    PA submitted on 10/03/22 Key [id number] Status is pending  Haze Rushing, CPhT Pharmacy Patient Advocate Specialist Direct Number: [phone] Fax: [phone]

## 2022-10-03 NOTE — Telephone Encounter (Signed)
Pharmacy Patient Advocate Encounter  Prior Authorization for Sherryll Burger has been approved.    PA# [id number] Effective dates: 10/03/22 through 10/03/23  Haze Rushing, CPhT Pharmacy Patient Advocate Specialist Direct Number: [phone] Fax: [phone]

## 2022-10-05 ENCOUNTER — Other Ambulatory Visit (HOSPITAL_COMMUNITY): Payer: Self-pay | Admitting: Nurse Practitioner

## 2022-10-05 DIAGNOSIS — Z1231 Encounter for screening mammogram for malignant neoplasm of breast: Secondary | ICD-10-CM

## 2022-10-11 ENCOUNTER — Ambulatory Visit (HOSPITAL_COMMUNITY): Payer: Medicaid Other

## 2022-10-11 ENCOUNTER — Encounter (HOSPITAL_COMMUNITY): Payer: Self-pay

## 2022-10-12 DIAGNOSIS — Z79899 Other long term (current) drug therapy: Secondary | ICD-10-CM | POA: Diagnosis not present

## 2022-10-14 ENCOUNTER — Other Ambulatory Visit: Payer: Self-pay | Admitting: Gastroenterology

## 2022-10-14 DIAGNOSIS — J449 Chronic obstructive pulmonary disease, unspecified: Secondary | ICD-10-CM | POA: Diagnosis not present

## 2022-10-14 DIAGNOSIS — R112 Nausea with vomiting, unspecified: Secondary | ICD-10-CM

## 2022-10-16 DIAGNOSIS — F333 Major depressive disorder, recurrent, severe with psychotic symptoms: Secondary | ICD-10-CM | POA: Diagnosis not present

## 2022-10-16 DIAGNOSIS — F411 Generalized anxiety disorder: Secondary | ICD-10-CM | POA: Diagnosis not present

## 2022-10-22 ENCOUNTER — Telehealth: Payer: Self-pay | Admitting: *Deleted

## 2022-10-22 NOTE — Telephone Encounter (Signed)
-----   Message from Sharlene Dory, NP sent at 10/13/2022  8:17 AM EDT ----- Her heart pumping function has returned to normal.  Excellent result!  Her cardiomyopathy was in the setting of her past acute illness (sepsis), NICM.   No medication changes at this time.  Follow-up with me as scheduled.   Sharlene Dory, AGNP-C

## 2022-10-22 NOTE — Telephone Encounter (Signed)
Lesle Chris, LPN 1/61/0960  4:54 PM EDT Back to Top    Notified, copy to pcp.

## 2022-11-05 ENCOUNTER — Encounter (HOSPITAL_COMMUNITY): Payer: Self-pay

## 2022-11-05 ENCOUNTER — Ambulatory Visit (HOSPITAL_COMMUNITY): Admission: RE | Admit: 2022-11-05 | Payer: Medicaid Other | Source: Ambulatory Visit

## 2022-11-12 ENCOUNTER — Encounter: Payer: Self-pay | Admitting: Nurse Practitioner

## 2022-11-12 ENCOUNTER — Encounter: Payer: Self-pay | Admitting: Emergency Medicine

## 2022-11-12 ENCOUNTER — Ambulatory Visit: Payer: Medicaid Other | Attending: Nurse Practitioner | Admitting: Nurse Practitioner

## 2022-11-12 VITALS — BP 130/70 | HR 68 | Ht 61.0 in | Wt 125.6 lb

## 2022-11-12 DIAGNOSIS — I10 Essential (primary) hypertension: Secondary | ICD-10-CM | POA: Diagnosis not present

## 2022-11-12 DIAGNOSIS — R600 Localized edema: Secondary | ICD-10-CM | POA: Diagnosis not present

## 2022-11-12 DIAGNOSIS — S99921A Unspecified injury of right foot, initial encounter: Secondary | ICD-10-CM

## 2022-11-12 DIAGNOSIS — I5032 Chronic diastolic (congestive) heart failure: Secondary | ICD-10-CM

## 2022-11-12 DIAGNOSIS — Z720 Tobacco use: Secondary | ICD-10-CM | POA: Diagnosis not present

## 2022-11-12 DIAGNOSIS — N281 Cyst of kidney, acquired: Secondary | ICD-10-CM | POA: Diagnosis not present

## 2022-11-12 DIAGNOSIS — I428 Other cardiomyopathies: Secondary | ICD-10-CM

## 2022-11-12 MED ORDER — METOPROLOL SUCCINATE ER 100 MG PO TB24: 100.0000 mg | ORAL_TABLET | Freq: Every day | ORAL | 3 refills | Status: DC

## 2022-11-12 MED ORDER — ATORVASTATIN CALCIUM 20 MG PO TABS: 20.0000 mg | ORAL_TABLET | Freq: Every day | ORAL | 3 refills | Status: DC

## 2022-11-12 MED ORDER — POTASSIUM CHLORIDE CRYS ER 20 MEQ PO TBCR: 20.0000 meq | EXTENDED_RELEASE_TABLET | Freq: Every day | ORAL | 3 refills | Status: DC | PRN

## 2022-11-12 MED ORDER — FUROSEMIDE 40 MG PO TABS: 40.0000 mg | ORAL_TABLET | Freq: Every day | ORAL | 1 refills | Status: DC | PRN

## 2022-11-12 MED ORDER — SPIRONOLACTONE 25 MG PO TABS: 12.5000 mg | ORAL_TABLET | Freq: Every day | ORAL | 2 refills | Status: DC

## 2022-11-12 NOTE — Patient Instructions (Signed)
Medication Instructions:  Your physician recommends that you continue on your current medications as directed. Please refer to the Current Medication list given to you today.  Labwork: Requesting labs from [facility]  Testing/Procedures: none  Follow-Up: Your physician recommends that you schedule a follow-up appointment in: 6 Months with VM  Any Other Special Instructions Will Be Listed Below (If Applicable).  If you need a refill on your cardiac medications before your next appointment, please call your pharmacy.

## 2022-11-12 NOTE — Progress Notes (Addendum)
Cardiology Office Note:  .   Date:  11/12/2022  ID:  [name], DOB 24-Jan-1971, MRN [id number] PCP: Smith Robert, MD  [facility] Providers Cardiologist:  Marjo Bicker, MD    History of Present Illness: [name] is a 52 y.o. female with a PMH of HFrecEF, HTN, IBS, chronic abdominal pain/nausea, GERD, colitis, and tobacco abuse, COPD, who presents today for HFrEF follow-up.   Last saw her on Sep 24, 2022. Doing well from a cardiac perspective. Completed IV ABX x 3 days through PICC line after returning home for recent hospital admission for severe sepsis d/t UTI, E. coli bacteremia, pyelonephritis, colonic wall thickening/ colitis, and lobar pneumonia. Updated Echo revealed recovered EF 10/03/2022.   Today she presents for follow-up. She states she is doing well. Did have a mechanical fall around 2 nights ago as she fell off a stepstool and oxygen tank landed on her right foot, wonders if she broke some toes. Had recent labs performed with her PCP's office. Denies any chest pain, shortness of breath, palpitations, syncope, presyncope, dizziness, orthopnea, PND, swelling or significant weight changes, acute bleeding, or claudication.  Studies Reviewed: .    Limited Echo 09/2022:   1. Left ventricular ejection fraction, by estimation, is 60 to 65%. The  left ventricle has normal function. The left ventricle has no regional  wall motion abnormalities. Left ventricular diastolic parameters are  indeterminate. The average left  ventricular global longitudinal strain is -21.4 %. The global longitudinal  strain is normal.   2. Right ventricular systolic function is normal. The right ventricular  size is normal.   3. Limited echo evaluate LV function   Comparison(s): Echocardiogram done 09/05/22 showed an EF of 25-30%.     Physical Exam:   VS:  BP 130/70   Pulse 68   Ht 5\' 1"  (1.549 m)   Wt 125 lb 9.6 oz (57 kg)   SpO2 97%   BMI 23.73 kg/m    Wt Readings from  Last 3 Encounters:  11/12/22 125 lb 9.6 oz (57 kg)  09/24/22 116 lb 6.4 oz (52.8 kg)  09/09/22 125 lb 7.1 oz (56.9 kg)    GEN: Well nourished, well developed in no acute distress NECK: No JVD; No carotid bruits CARDIAC: S1/S2, RRR, no murmurs, rubs, gallops RESPIRATORY:  Clear to auscultation without rales, wheezing or rhonchi  ABDOMEN: Soft, non-tender, non-distended EXTREMITIES:  Nonpitting edema to right lateral toes, some minor bruising; No deformity   ASSESSMENT AND PLAN: .    1. HFrecEF, nonischemic cardiomyopathy Stage C, class I-II symptoms. EF recovered to 60-65%. Euvolemic and well compensated on exam. Continue current GDMT. Not a candidate for SGLT2i d/t hx of sepsis. Low sodium diet, fluid restriction <2L, and daily weights encouraged. Educated to contact our office for weight gain of 2 lbs overnight or 5 lbs in one week. Will provider refills per her request and request recent labs with PCP.  Heart healthy diet and regular cardiovascular exercise encouraged.   2. HTN BP stable. Continue current medication regimen. Discussed to monitor BP at home at least 2 hours after medications and sitting for 5-10 minutes. Heart healthy diet and regular cardiovascular exercise encouraged.    Tobacco abuse Smoking cessation encouraged and discussed.    Complex renal cyst Follows Dr. Wolfgang Phoenix. Continue to follow-up with Nephrology.  5. Pedal edema, foot injury Recommend follow-up with PCP and for PCP to arrange imaging.    Dispo: Follow-up with Dr. Jenene Slicker  or APP in 6 months or sooner if anything changes.   Signed, Sharlene Dory, NP

## 2022-11-13 DIAGNOSIS — J449 Chronic obstructive pulmonary disease, unspecified: Secondary | ICD-10-CM | POA: Diagnosis not present

## 2022-11-19 ENCOUNTER — Other Ambulatory Visit: Payer: Self-pay

## 2022-11-19 ENCOUNTER — Other Ambulatory Visit: Payer: Self-pay | Admitting: Nurse Practitioner

## 2022-11-19 DIAGNOSIS — Z79899 Other long term (current) drug therapy: Secondary | ICD-10-CM

## 2022-11-19 MED ORDER — ATORVASTATIN CALCIUM 40 MG PO TABS: 40.0000 mg | ORAL_TABLET | Freq: Every day | ORAL | 3 refills | Status: DC

## 2022-11-22 ENCOUNTER — Other Ambulatory Visit: Payer: Self-pay | Admitting: Gastroenterology

## 2022-11-22 DIAGNOSIS — R112 Nausea with vomiting, unspecified: Secondary | ICD-10-CM

## 2022-11-23 ENCOUNTER — Other Ambulatory Visit: Payer: Self-pay | Admitting: Gastroenterology

## 2022-11-23 DIAGNOSIS — R1084 Generalized abdominal pain: Secondary | ICD-10-CM

## 2022-11-23 DIAGNOSIS — G8929 Other chronic pain: Secondary | ICD-10-CM

## 2022-11-28 ENCOUNTER — Other Ambulatory Visit (HOSPITAL_COMMUNITY): Payer: Self-pay | Admitting: Nurse Practitioner

## 2022-11-28 DIAGNOSIS — N644 Mastodynia: Secondary | ICD-10-CM

## 2022-11-29 NOTE — Progress Notes (Signed)
Referring Provider: Smith Robert, MD Primary Care Physician:  Smith Robert, MD Primary GI Physician: Dr. Marletta Lor  Chief Complaint  Patient presents with   Follow-up    Hospital follow up. Colitis flare up     HPI:   [name] is a 52 y.o. female with history of GERD, Candida esophagitiss, chronic nausea, chronic constipation, chronic abdominal pain, colitis on CTs previously.  She has been empirically treated for IBD/suspected Crohn's with prednisone and Entocort but has also been treated with antibiotics. It is unclear if she truly has IBD, but suspect more IBS.  Follow-up colonoscopy February 2023 (off steroids for several months) with normal exam aside from nonbleeding internal hemorrhoids and normal colon biopsies.   She was admitted in April after presenting with worsening abdominal pain and found to have sepsis in the setting of bacteremia, pyelonephritis, pneumonia, concern for colitis on CT.  C. difficile and GI pathogen panel were negative.  She was treated empirically with antibiotics.  Recommended outpatient colonoscopy. She was also found to have anemia with hemoglobin 8.4 on admission, down from 13.2 in January 2024, heme-negative, but iron and iron saturation low.  Hemoglobin declined as low as 6.5 with no overt GI bleeding.  She has been taking 1-2 BC powders daily for headaches outpatient.  She received 2 units PRBCs and hemoglobin improved to 12.5, 10.5 at the time of discharge.  We had initially considered EGD indication, but echocardiogram revealed EF of 25-30% with circumferential pericardial effusion.  Recommended against inpatient procedures.  Could consider outpatient once medically improved.  Repeat echo 5/21 with EF improved to 60-65%.   Today:  Feels that her colitis is flaring again. Reports having watery diarrhea and primarily upper abdominal pain for the last 2 days that is worsening this morning. Had 6 Bms yesterday, couldn't sleep last night due to  ongoing diarrhea and pain, and has already had 4 Bms this morning. States every time colitis flares, she gets diarrhea and primarily upper abdominal pain. No brbpr or melena. Chronic nausea/vomiting is well controlled with Zofran and phenergan. Chronically taking Bentyl 20 mg BID but doesn't seem to be helping her pain currently. Backed down to a liquid diet yesterday.   Reports she had been doing well with 1-2 formed stools per day and no real abdominal pain.   Denies any recent antibiotics, travel, medication changes, sick contacts.   Chronic GERD remains well controlled on Pantoprazole 40 mg BID.   Denies NSAIDs.   She is not taking iron.   Has tolerated cipro in the past.    Past Medical History:  Diagnosis Date   Chronic abdominal pain    Chronic back pain    Colitis    Constipation    GERD (gastroesophageal reflux disease)    Hypertension    Migraine    Plumbism    blood clot    PONV (postoperative nausea and vomiting)    Renal mass    Uterine cancer [facility])    age 40    Past Surgical History:  Procedure Laterality Date   ABDOMINAL HYSTERECTOMY     BACK SURGERY     BIOPSY  07/07/2021   Procedure: BIOPSY;  Surgeon: Lanelle Bal, DO;  Location: [facility];  Service: Endoscopy;;   CHOLECYSTECTOMY     Complicated by bile leak requiring ERCP with temporary stenting   COLONOSCOPY WITH ESOPHAGOGASTRODUODENOSCOPY (EGD)  07/2009   [facility]: Op notes cannot be seen through care everywhere however pathology showed  terminal ileum and random colon biopsies normal.  Stomach biopsy with gastric antral and fundic mucosa with reactive foveolar hyperplasia.  No active gastritis.  Stains negative for H. pylori.   COLONOSCOPY WITH PROPOFOL N/A 07/07/2021   Surgeon: Earnest Bailey K, DO;  Non-bleeding internal hemorrhoids, normal TI  biopsied, normal colon biopsied. Pathology with colon and TI biopsies normal.   ESOPHAGEAL BRUSHING  07/07/2021   Procedure: ESOPHAGEAL BRUSHING;   Surgeon: Lanelle Bal, DO;  Location: [facility];  Service: Endoscopy;;   ESOPHAGOGASTRODUODENOSCOPY  06/2007   [facility]: Normal esophagus, gastric mucosal abnormality with erythema, biopsy showed gastric antral and fundic mucosa with reactive foveolar hyperplasia, no gastritis, no H. pylori.   ESOPHAGOGASTRODUODENOSCOPY (EGD) WITH PROPOFOL N/A 07/07/2021   Surgeon: Lanelle Bal, DO; moderately severe Candida esophagitis, gastritis, biopsies negative for H. pylori, normal examined duodenum.  Prescribed Diflucan and recommended PPI twice daily.   HAND SURGERY      Current Outpatient Medications  Medication Sig Dispense Refill   acetaminophen (TYLENOL) 500 MG tablet Take 500 mg by mouth every 8 (eight) hours as needed.     albuterol (VENTOLIN HFA) 108 (90 Base) MCG/ACT inhaler Inhale 2 puffs into the lungs every 4 (four) hours as needed for shortness of breath or wheezing.     atorvastatin (LIPITOR) 40 MG tablet Take 1 tablet (40 mg total) by mouth daily. 90 tablet 3   ciprofloxacin (CIPRO) 500 MG tablet Take 1 tablet (500 mg total) by mouth 2 (two) times daily. 14 tablet 0   dicyclomine (BENTYL) 20 MG tablet Take 1 tablet (20 mg total) by mouth 4 (four) times daily -  before meals and at bedtime. TAKE ONE TABLET BY MOUTH THREE TIMES DAILY WITH FOOD. Hold for constipation 120 tablet 3   famotidine (PEPCID) 20 MG tablet Take 20 mg by mouth daily. For break through heart burn     FLUoxetine (PROZAC) 40 MG capsule Take 40 mg by mouth daily.     furosemide (LASIX) 40 MG tablet Take 1 tablet (40 mg total) by mouth daily as needed (swelling). 30 tablet 1   hydrOXYzine (ATARAX/VISTARIL) 50 MG tablet Take 50 mg by mouth every 6 (six) hours as needed for anxiety.     ipratropium-albuterol (DUONEB) 0.5-2.5 (3) MG/3ML SOLN Take 3 mLs by nebulization every 4 (four) hours as needed (SOB, wheezing, coughing). 360 mL 1   metoprolol succinate (TOPROL-XL) 100 MG 24 hr tablet Take 1 tablet (100 mg total)  by mouth daily. Take with or immediately following a meal. 30 tablet 3   Nebulizer System All-In-One MISC 1 Device by Does not apply route as directed. 1 each 0   NICODERM CQ 21 MG/24HR patch Place 21 mg onto the skin daily.     nitroGLYCERIN (NITROSTAT) 0.4 MG SL tablet Place 1 tablet (0.4 mg total) under the tongue every 5 (five) minutes as needed for chest pain. (Patient taking differently: Place 0.4 mg under the tongue every 5 (five) minutes x 3 doses as needed for chest pain (if no relief after 3rd dose, proceed to ED or call 911).) 15 tablet 2   ondansetron (ZOFRAN-ODT) 8 MG disintegrating tablet Take 1 tablet (8 mg total) by mouth every 8 (eight) hours. 60 tablet 3   pantoprazole (PROTONIX) 40 MG tablet TAKE (1) TABLET BY MOUTH TWICE A DAY BEFORE MEALS. (BREAKFAST AND SUPPER) 60 tablet 5   potassium chloride SA (KLOR-CON M) 20 MEQ tablet Take 1 tablet (20 mEq total) by mouth  daily as needed. 30 tablet 3   promethazine (PHENERGAN) 25 MG tablet Take 1 tablet (25 mg total) by mouth every 6 (six) hours as needed for nausea or vomiting. 30 tablet 3   sacubitril-valsartan (ENTRESTO) 24-26 MG Take 1 tablet by mouth 2 (two) times daily. 60 tablet 6   SPIRIVA HANDIHALER 18 MCG inhalation capsule 1 capsule by inhaling the contents of the capsule using the HandiHaler device Inhalation Once a day for 30 days     spironolactone (ALDACTONE) 25 MG tablet Take 0.5 tablets (12.5 mg total) by mouth daily. 45 tablet 2   temazepam (RESTORIL) 30 MG capsule Take 30 mg by mouth at bedtime as needed for sleep.     metroNIDAZOLE (FLAGYL) 500 MG tablet Take 1 tablet (500 mg total) by mouth 3 (three) times daily. 21 tablet 0   No current facility-administered medications for this visit.    Allergies as of 11/30/2022 - Review Complete 11/30/2022  Allergen Reaction Noted   Buprenorphine Other (See Comments) 01/24/2016   Penicillins Swelling and Rash 07/08/2011   Aripiprazole Other (See Comments) 08/01/2020    Seroquel [quetiapine] Other (See Comments) 08/18/2020   Cefaclor Other (See Comments)    Doxycycline Rash 08/01/2020   Gabapentin Nausea And Vomiting 01/24/2016   Ibuprofen Swelling and Rash 07/08/2011   Levofloxacin Rash 01/24/2016   Lisinopril Nausea And Vomiting 08/01/2020   Macrobid [nitrofurantoin monohyd macro] Rash 11/30/2014   Naproxen Swelling and Rash 07/08/2011    Family History  Adopted: Yes  Problem Relation Age of Onset   Colon cancer Maternal Grandfather        46s   Colon cancer Maternal Uncle        54s   Cancer Other    Seizures Other    Stroke Other    Diabetes Other     Social History   Socioeconomic History   Marital status: Married    Spouse name: benito   Number of children: 2   Years of education: Not on file   Highest education level: Associate degree: occupational, Scientist, product/process development, or vocational program  Occupational History    Comment: not employed  Tobacco Use   Smoking status: Every Day    Current packs/day: 1.00    Average packs/day: 1 pack/day for 20.0 years (20.0 ttl pk-yrs)    Types: Cigarettes, E-cigarettes   Smokeless tobacco: Never  Vaping Use   Vaping status: Never Used  Substance and Sexual Activity   Alcohol use: No   Drug use: No   Sexual activity: Yes    Birth control/protection: Surgical  Other Topics Concern   Not on file  Social History Narrative   Not on file   Social Determinants of Health   Financial Resource Strain: Low Risk  (03/19/2017)   Overall Financial Resource Strain (CARDIA)    Difficulty of Paying Living Expenses: Not very hard  Food Insecurity: No Food Insecurity (03/19/2017)   Hunger Vital Sign    Worried About Running Out of Food in the Last Year: Never true    Ran Out of Food in the Last Year: Never true  Transportation Needs: No Transportation Needs (03/19/2017)   PRAPARE - Administrator, Civil Service (Medical): No    Lack of Transportation (Non-Medical): No  Physical Activity: Inactive  (03/19/2017)   Exercise Vital Sign    Days of Exercise per Week: 0 days    Minutes of Exercise per Session: 0 min  Stress: Stress Concern Present (03/19/2017)   [place]  [employer] - Occupational Stress Questionnaire    Feeling of Stress : To some extent  Social Connections: Somewhat Isolated (03/19/2017)   Social Connection and Isolation Panel [NHANES]    Frequency of Communication with Friends and Family: More than three times a week    Frequency of Social Gatherings with Friends and Family: Twice a week    Attends Religious Services: Never    Database administrator or Organizations: No    Attends Engineer, structural: Never    Marital Status: Married    Review of Systems: Gen: Denies fever, chills, cold or flu like symptoms, pre-syncope, or syncope.  CV: Denies chest pain, palpitations. Resp: Denies dyspnea at rest, cough.  GI: See HPI Heme: See HPI  Physical Exam: BP 135/68 (BP Location: Right Arm, Patient Position: Sitting, Cuff Size: Normal)   Pulse 68   Temp 97.8 F (36.6 C) (Temporal)   Ht 5\' 1"  (1.549 m)   Wt 127 lb (57.6 kg)   SpO2 100%   BMI 24.00 kg/m  General:   Alert and oriented. No distress noted. Pleasant and cooperative.  Head:  Normocephalic and atraumatic. Eyes:  Conjuctiva clear without scleral icterus. Heart:  S1, S2 present without murmurs appreciated. Lungs:  Clear to auscultation bilaterally. No wheezes, rales, or rhonchi. No distress.  Abdomen:  +BS, full but soft, moderate TTP across upper abdomen, mild TTP in RLQ. No rebound or guarding. No masses noted. Msk:  Symmetrical without gross deformities. Normal posture. Extremities:  Without edema. Neurologic:  Alert and  oriented x4 Psych:  Normal mood and affect.    Assessment:  52 y.o. female with history of GERD, Candida esophagitiss, chronic nausea, chronic constipation, chronic abdominal pain, recurrent documented colitis on prior CTs of unknown etiology,  presenting today for follow-up with concern for recurrent colitis.   Generalized abdominal pain/Diarrhea/History of Colitis:  Prior episodes of documented colitis include April 2022, January 2024, and April 2024. 2022 CT concerning for Crohn's colitis and treated with steroids with improvement. Follow-up colonoscopy Feb 2023 normal with normal colonic biopsies though this was after a course of steroids. Following colitis flares have been treated with antibiotics with improvement. Stool studies in April including C diff and GI pathogen panel were negative. She reports she was doing well following antibiotics without any significant abdominal pain or diarrhea until 2 days. Now with primarily upper abdominal pain (most recent CT in April with colitis involving ascending and proximal transverse colon) and multiple episodes of watery diarrhea. Denies recent antibiotics, travel, medication changes, sick contacts.   Symptoms do seem consistent with recurrent colitis. Unable to get STAT CT today due to global epic/computer outage. Advised patient that she could go to the ER or we could try empiric treatment with antibiotics. She preferred to try antibiotics, but knows to proceed to the ER if persistent/worsening symptoms. Will try to obtain labs/stool studies if lab is open. She needs a colonoscopy to follow-up on recurrent colitis once acute symptoms have resolved.   GERD:  Well controlled on Pantoprazole 40 mg BID.   Chronic nausea:  Well controlled on Zofran and Phenergan. Etiology is unclear. She has had extensive evaluation. EGD in February 2023 with Candida esophagitis, gastritis. She was treated with fluconazole. Chronic GERD well-controlled. History of cholecystectomy. Previously, LFTs within normal limits, MRI abdomen in 2022 without biliary abnormalities. Gastric emptying study normal in 2023.   IDA:  Found to have acute anemia in April 2024 while admitted with sepsis in  the setting of colitis,  bacteremia, pyelonephritis, and pneumonia. Hgb was 8.4 on admission, down from 13.2 in January 2024. Hemoccult negative. Iron low at 10, saturation ratio low at 4%, ferritin 31. Hemoglobin declined as low as 6.5. She received 2 units PRBCs with Hgb improving to 12.5,  but was 10.5 at the time of discharge. No repeat labs since that time. She is not taking iron. Continues to deny overt GI bleeding. She had been taking BC powders, but has since discontinued them. She is taking PPI BID. Due to acute illness during the hospital and significantly low EF, we recommended outpatient endoscopy. Notably, EF has improved significantly, back to normal in May. She would be appropriate for EGD and colonoscopy here at [facility]; however, she is now dealing with suspected recurrent colitis as discussed above, so we will hold off on scheduling. I will update labs and plan to see her back in about 4 weeks.    Plan:  CBC, CMP, Lipase STAT, C diff toxin assay, GI profile panel  Ciprofloxacin 500 mg BID and flagyl 500 mg TID x 7 days for suspected colitis.  Continue Bentyl 3-4 times a day as needed.  No imodium for now.  If abdominal pain persiste/worsens over the weekend, recommended she proceed to the ER.  Requested progress report on Monday.  Continue pantoprazole 40 mg BID.  Continue Zofran and phenergan as needed.  Follow-up in 4 weeks.    Ermalinda Memos, PA-C [facility] 11/30/2022

## 2022-11-30 ENCOUNTER — Other Ambulatory Visit: Payer: Self-pay | Admitting: *Deleted

## 2022-11-30 ENCOUNTER — Ambulatory Visit (INDEPENDENT_AMBULATORY_CARE_PROVIDER_SITE_OTHER): Payer: Medicaid Other | Admitting: Gastroenterology

## 2022-11-30 ENCOUNTER — Encounter: Payer: Self-pay | Admitting: Gastroenterology

## 2022-11-30 VITALS — BP 135/68 | HR 68 | Temp 97.8°F | Ht 61.0 in | Wt 127.0 lb

## 2022-11-30 DIAGNOSIS — G8929 Other chronic pain: Secondary | ICD-10-CM | POA: Diagnosis not present

## 2022-11-30 DIAGNOSIS — D509 Iron deficiency anemia, unspecified: Secondary | ICD-10-CM | POA: Diagnosis not present

## 2022-11-30 DIAGNOSIS — R1084 Generalized abdominal pain: Secondary | ICD-10-CM

## 2022-11-30 DIAGNOSIS — R1013 Epigastric pain: Secondary | ICD-10-CM

## 2022-11-30 DIAGNOSIS — K219 Gastro-esophageal reflux disease without esophagitis: Secondary | ICD-10-CM

## 2022-11-30 DIAGNOSIS — J449 Chronic obstructive pulmonary disease, unspecified: Secondary | ICD-10-CM

## 2022-11-30 DIAGNOSIS — R197 Diarrhea, unspecified: Secondary | ICD-10-CM

## 2022-11-30 DIAGNOSIS — R112 Nausea with vomiting, unspecified: Secondary | ICD-10-CM

## 2022-11-30 MED ORDER — PANTOPRAZOLE SODIUM 40 MG PO TBEC
DELAYED_RELEASE_TABLET | ORAL | 5 refills | Status: DC
Start: 2022-11-30 — End: 2023-08-12

## 2022-11-30 MED ORDER — PROMETHAZINE HCL 25 MG PO TABS
25.0000 mg | ORAL_TABLET | Freq: Four times a day (QID) | ORAL | 3 refills | Status: DC | PRN
Start: 2022-11-30 — End: 2023-01-01

## 2022-11-30 MED ORDER — ONDANSETRON 8 MG PO TBDP
8.0000 mg | ORAL_TABLET | Freq: Three times a day (TID) | ORAL | 3 refills | Status: DC
Start: 2022-11-30 — End: 2023-05-30

## 2022-11-30 MED ORDER — METRONIDAZOLE 500 MG PO TABS
500.0000 mg | ORAL_TABLET | Freq: Three times a day (TID) | ORAL | 0 refills | Status: AC
Start: 2022-11-30 — End: ?

## 2022-11-30 MED ORDER — CIPROFLOXACIN HCL 500 MG PO TABS
500.0000 mg | ORAL_TABLET | Freq: Two times a day (BID) | ORAL | 0 refills | Status: DC
Start: 2022-11-30 — End: 2023-01-24

## 2022-11-30 MED ORDER — DICYCLOMINE HCL 20 MG PO TABS
20.0000 mg | ORAL_TABLET | Freq: Three times a day (TID) | ORAL | 3 refills | Status: DC
Start: 2022-11-30 — End: 2023-02-25

## 2022-11-30 NOTE — Patient Instructions (Addendum)
Please have labs completed today if they are open. I have also ordered stool studies.   Start ciprofloxacin 500 mg twice daily and Flagyl 500 mg 3 times a day for suspected colitis flare.   Continue Bentyl 3-4 times daily as needed for abdominal pain.   If you r abdominal pain continues/worsens over the weekend, proceed to the emergency room.   Continue pantoprazole 40 mg twice daily.   Continue zofran and phenergan for nausea.   Please call me on Monday with a progress report on how you are feeling.   Ermalinda Memos, PA-C [facility]

## 2022-12-03 ENCOUNTER — Other Ambulatory Visit (HOSPITAL_COMMUNITY): Payer: Self-pay | Admitting: Nurse Practitioner

## 2022-12-03 ENCOUNTER — Inpatient Hospital Stay
Admission: RE | Admit: 2022-12-03 | Discharge: 2022-12-03 | Disposition: A | Discharge status: Home / Self Care | Payer: Self-pay | Source: Ambulatory Visit | Attending: Nurse Practitioner | Admitting: Nurse Practitioner

## 2022-12-03 DIAGNOSIS — N644 Mastodynia: Secondary | ICD-10-CM

## 2022-12-03 DIAGNOSIS — G894 Chronic pain syndrome: Secondary | ICD-10-CM | POA: Diagnosis not present

## 2022-12-03 DIAGNOSIS — M549 Dorsalgia, unspecified: Secondary | ICD-10-CM | POA: Diagnosis not present

## 2022-12-03 DIAGNOSIS — Z79891 Long term (current) use of opiate analgesic: Secondary | ICD-10-CM | POA: Diagnosis not present

## 2022-12-03 DIAGNOSIS — Z79899 Other long term (current) drug therapy: Secondary | ICD-10-CM | POA: Diagnosis not present

## 2022-12-05 DIAGNOSIS — G4700 Insomnia, unspecified: Secondary | ICD-10-CM | POA: Diagnosis not present

## 2022-12-05 DIAGNOSIS — F329 Major depressive disorder, single episode, unspecified: Secondary | ICD-10-CM | POA: Diagnosis not present

## 2022-12-05 DIAGNOSIS — Z79899 Other long term (current) drug therapy: Secondary | ICD-10-CM | POA: Diagnosis not present

## 2022-12-05 DIAGNOSIS — F411 Generalized anxiety disorder: Secondary | ICD-10-CM | POA: Diagnosis not present

## 2022-12-11 ENCOUNTER — Ambulatory Visit (HOSPITAL_COMMUNITY): Payer: Medicaid Other

## 2022-12-11 ENCOUNTER — Inpatient Hospital Stay (HOSPITAL_COMMUNITY): Admission: RE | Admit: 2022-12-11 | Payer: Medicaid Other | Source: Ambulatory Visit

## 2022-12-11 ENCOUNTER — Encounter (HOSPITAL_COMMUNITY): Payer: Self-pay

## 2022-12-14 DIAGNOSIS — J449 Chronic obstructive pulmonary disease, unspecified: Secondary | ICD-10-CM | POA: Diagnosis not present

## 2022-12-29 NOTE — Progress Notes (Deleted)
Referring Provider: Smith Robert, MD Primary Care Physician:  Smith Robert, MD Primary GI Physician: Dr. Bonnetta Barry chief complaint on file.   HPI:   [name] is a 52 y.o. female  with history of GERD, Candida esophagitis, chronic nausea, chronic constipation, chronic abdominal pain, colitis on CTs previously. She has been empirically treated for IBD/suspected Crohn's with prednisone and Entocort but has also been treated with antibiotics. It is unclear if she truly has IBD, but suspect more IBS. Follow-up colonoscopy February 2023 (off steroids for several months) with normal exam aside from nonbleeding internal hemorrhoids and normal colon biopsies.   Previously admitted in April 2024 after presenting with worsening abdominal pain and found to have sepsis in the setting of bacteremia, pyelonephritis, pneumonia, concern for colitis on CT.  C. difficile and GI pathogen panel were negative.  She was treated empirically with antibiotics.  Recommended outpatient colonoscopy. She was also found to have anemia with hemoglobin 8.4 on admission, down from 13.2 in January 2024, heme-negative, but iron and iron saturation low.  Hemoglobin declined as low as 6.5 with no overt GI bleeding.  She has been taking 1-2 BC powders daily for headaches outpatient.  She received 2 units PRBCs and hemoglobin improved to 12.5, 10.5 at the time of discharge.  We had initially considered EGD indication, but echocardiogram revealed EF of 25-30% with circumferential pericardial effusion.  Recommended against inpatient procedures.  Could consider outpatient once medically improved.   Repeat echo 5/21 with EF improved to 60-65%.   Last seen in office 11/30/2022.  She felt that her colitis was flaring again.  Reported having frequent watery diarrhea, primarily upper abdominal pain for the last 2 days, consistent with her prior flares.  Reported prior to this, she had been doing well with 1-2 formed stools daily with  Bentyl 20 mg twice daily.  Had not really been having any abdominal pain.  Her chronic nausea was well-controlled with Zofran and Phenergan.  She denies any recent antibiotics, travel, medication changes, sick contacts.  Denied NSAIDs.  GERD remains well-controlled. She was taking PPI BID and denied NISAD use (discontinued BC powders).  Unfortunately, due to global epic/computer outage, we were unable to obtain stat CT.  She was started on Cipro and Flagyl empirically for suspected colitis.  Labs and stool studies were ordered and advised patient to have them completed ASAP.  Would need colonoscopy and EGD once acute symptoms resolved for follow-up on colitis and IDA.   Labs/stool studies not completed.   Today:    Past Medical History:  Diagnosis Date   Chronic abdominal pain    Chronic back pain    Colitis    Constipation    GERD (gastroesophageal reflux disease)    Hypertension    Migraine    Plumbism    blood clot    PONV (postoperative nausea and vomiting)    Renal mass    Uterine cancer [facility])    age 52    Past Surgical History:  Procedure Laterality Date   ABDOMINAL HYSTERECTOMY     BACK SURGERY     BIOPSY  07/07/2021   Procedure: BIOPSY;  Surgeon: Lanelle Bal, DO;  [facility];  Service: Endoscopy;;   CHOLECYSTECTOMY     Complicated by bile leak requiring ERCP with temporary stenting   COLONOSCOPY WITH ESOPHAGOGASTRODUODENOSCOPY (EGD)  07/2009   [facility]: Op notes cannot be seen through care everywhere however pathology showed terminal ileum and random  colon biopsies normal.  Stomach biopsy with gastric antral and fundic mucosa with reactive foveolar hyperplasia.  No active gastritis.  Stains negative for H. pylori.   COLONOSCOPY WITH PROPOFOL N/A 07/07/2021   Surgeon: Earnest Bailey K, DO;  Non-bleeding internal hemorrhoids, normal TI  biopsied, normal colon biopsied. Pathology with colon and TI biopsies normal.   ESOPHAGEAL BRUSHING  07/07/2021    Procedure: ESOPHAGEAL BRUSHING;  Surgeon: Lanelle Bal, DO;  [facility];  Service: Endoscopy;;   ESOPHAGOGASTRODUODENOSCOPY  06/2007   [facility]: Normal esophagus, gastric mucosal abnormality with erythema, biopsy showed gastric antral and fundic mucosa with reactive foveolar hyperplasia, no gastritis, no H. pylori.   ESOPHAGOGASTRODUODENOSCOPY (EGD) WITH PROPOFOL N/A 07/07/2021   Surgeon: Lanelle Bal, DO; moderately severe Candida esophagitis, gastritis, biopsies negative for H. pylori, normal examined duodenum.  Prescribed Diflucan and recommended PPI twice daily.   HAND SURGERY      Current Outpatient Medications  Medication Sig Dispense Refill   acetaminophen (TYLENOL) 500 MG tablet Take 500 mg by mouth every 8 (eight) hours as needed.     albuterol (VENTOLIN HFA) 108 (90 Base) MCG/ACT inhaler Inhale 2 puffs into the lungs every 4 (four) hours as needed for shortness of breath or wheezing.     atorvastatin (LIPITOR) 40 MG tablet Take 1 tablet (40 mg total) by mouth daily. 90 tablet 3   ciprofloxacin (CIPRO) 500 MG tablet Take 1 tablet (500 mg total) by mouth 2 (two) times daily. 14 tablet 0   dicyclomine (BENTYL) 20 MG tablet Take 1 tablet (20 mg total) by mouth 4 (four) times daily -  before meals and at bedtime. TAKE ONE TABLET BY MOUTH THREE TIMES DAILY WITH FOOD. Hold for constipation 120 tablet 3   famotidine (PEPCID) 20 MG tablet Take 20 mg by mouth daily. For break through heart burn     FLUoxetine (PROZAC) 40 MG capsule Take 40 mg by mouth daily.     furosemide (LASIX) 40 MG tablet Take 1 tablet (40 mg total) by mouth daily as needed (swelling). 30 tablet 1   hydrOXYzine (ATARAX/VISTARIL) 50 MG tablet Take 50 mg by mouth every 6 (six) hours as needed for anxiety.     ipratropium-albuterol (DUONEB) 0.5-2.5 (3) MG/3ML SOLN Take 3 mLs by nebulization every 4 (four) hours as needed (SOB, wheezing, coughing). 360 mL 1   metoprolol succinate (TOPROL-XL) 100 MG 24 hr  tablet Take 1 tablet (100 mg total) by mouth daily. Take with or immediately following a meal. 30 tablet 3   metroNIDAZOLE (FLAGYL) 500 MG tablet Take 1 tablet (500 mg total) by mouth 3 (three) times daily. 21 tablet 0   Nebulizer System All-In-One MISC 1 Device by Does not apply route as directed. 1 each 0   NICODERM CQ 21 MG/24HR patch Place 21 mg onto the skin daily.     nitroGLYCERIN (NITROSTAT) 0.4 MG SL tablet Place 1 tablet (0.4 mg total) under the tongue every 5 (five) minutes as needed for chest pain. (Patient taking differently: Place 0.4 mg under the tongue every 5 (five) minutes x 3 doses as needed for chest pain (if no relief after 3rd dose, proceed to ED or call 911).) 15 tablet 2   ondansetron (ZOFRAN-ODT) 8 MG disintegrating tablet Take 1 tablet (8 mg total) by mouth every 8 (eight) hours. 60 tablet 3   pantoprazole (PROTONIX) 40 MG tablet TAKE (1) TABLET BY MOUTH TWICE A DAY BEFORE MEALS. (BREAKFAST AND SUPPER) 60 tablet 5  potassium chloride SA (KLOR-CON M) 20 MEQ tablet Take 1 tablet (20 mEq total) by mouth daily as needed. 30 tablet 3   promethazine (PHENERGAN) 25 MG tablet Take 1 tablet (25 mg total) by mouth every 6 (six) hours as needed for nausea or vomiting. 30 tablet 3   sacubitril-valsartan (ENTRESTO) 24-26 MG Take 1 tablet by mouth 2 (two) times daily. 60 tablet 6   SPIRIVA HANDIHALER 18 MCG inhalation capsule 1 capsule by inhaling the contents of the capsule using the HandiHaler device Inhalation Once a day for 30 days     spironolactone (ALDACTONE) 25 MG tablet Take 0.5 tablets (12.5 mg total) by mouth daily. 45 tablet 2   temazepam (RESTORIL) 30 MG capsule Take 30 mg by mouth at bedtime as needed for sleep.     No current facility-administered medications for this visit.    Allergies as of 12/31/2022 - Review Complete 11/30/2022  Allergen Reaction Noted   Buprenorphine Other (See Comments) 01/24/2016   Penicillins Swelling and Rash 07/08/2011   Aripiprazole Other  (See Comments) 08/01/2020   Seroquel [quetiapine] Other (See Comments) 08/18/2020   Cefaclor Other (See Comments)    Doxycycline Rash 08/01/2020   Gabapentin Nausea And Vomiting 01/24/2016   Ibuprofen Swelling and Rash 07/08/2011   Levofloxacin Rash 01/24/2016   Lisinopril Nausea And Vomiting 08/01/2020   Macrobid [nitrofurantoin monohyd macro] Rash 11/30/2014   Naproxen Swelling and Rash 07/08/2011    Family History  Adopted: Yes  Problem Relation Age of Onset   Colon cancer Maternal Grandfather        25s   Colon cancer Maternal Uncle        20s   Cancer Other    Seizures Other    Stroke Other    Diabetes Other     Social History   Socioeconomic History   Marital status: Married    Spouse name: benito   Number of children: 2   Years of education: Not on file   Highest education level: Associate degree: occupational, Scientist, product/process development, or vocational program  Occupational History    Comment: not employed  Tobacco Use   Smoking status: Every Day    Current packs/day: 1.00    Average packs/day: 1 pack/day for 20.0 years (20.0 ttl pk-yrs)    Types: Cigarettes, E-cigarettes   Smokeless tobacco: Never  Vaping Use   Vaping status: Never Used  Substance and Sexual Activity   Alcohol use: No   Drug use: No   Sexual activity: Yes    Birth control/protection: Surgical  Other Topics Concern   Not on file  Social History Narrative   Not on file   Social Determinants of Health   Financial Resource Strain: Low Risk  (03/19/2017)   Overall Financial Resource Strain (CARDIA)    Difficulty of Paying Living Expenses: Not very hard  Food Insecurity: No Food Insecurity (03/19/2017)   Hunger Vital Sign    Worried About Running Out of Food in the Last Year: Never true    Ran Out of Food in the Last Year: Never true  Transportation Needs: No Transportation Needs (03/19/2017)   PRAPARE - Administrator, Civil Service (Medical): No    Lack of Transportation (Non-Medical): No   Physical Activity: Inactive (03/19/2017)   Exercise Vital Sign    Days of Exercise per Week: 0 days    Minutes of Exercise per Session: 0 min  Stress: Stress Concern Present (03/19/2017)   [employer] of Occupational Health - Occupational  Stress Questionnaire    Feeling of Stress : To some extent  Social Connections: Somewhat Isolated (03/19/2017)   Social Connection and Isolation Panel [NHANES]    Frequency of Communication with Friends and Family: More than three times a week    Frequency of Social Gatherings with Friends and Family: Twice a week    Attends Religious Services: Never    Database administrator or Organizations: No    Attends Engineer, structural: Never    Marital Status: Married    Review of Systems: Gen: Denies fever, chills, anorexia. Denies fatigue, weakness, weight loss.  CV: Denies chest pain, palpitations, syncope, peripheral edema, and claudication. Resp: Denies dyspnea at rest, cough, wheezing, coughing up blood, and pleurisy. GI: Denies vomiting blood, jaundice, and fecal incontinence.   Denies dysphagia or odynophagia. Derm: Denies rash, itching, dry skin Psych: Denies depression, anxiety, memory loss, confusion. No homicidal or suicidal ideation.  Heme: Denies bruising, bleeding, and enlarged lymph nodes.  Physical Exam: There were no vitals taken for this visit. General:   Alert and oriented. No distress noted. Pleasant and cooperative.  Head:  Normocephalic and atraumatic. Eyes:  Conjuctiva clear without scleral icterus. Heart:  S1, S2 present without murmurs appreciated. Lungs:  Clear to auscultation bilaterally. No wheezes, rales, or rhonchi. No distress.  Abdomen:  +BS, soft, non-tender and non-distended. No rebound or guarding. No HSM or masses noted. Msk:  Symmetrical without gross deformities. Normal posture. Extremities:  Without edema. Neurologic:  Alert and  oriented x4 Psych:  Normal mood and affect.    Assessment:      Plan:  ***   Ermalinda Memos, PA-C [facility] Gastroenterology 12/31/2022

## 2022-12-31 ENCOUNTER — Ambulatory Visit: Payer: Medicaid Other | Admitting: Gastroenterology

## 2022-12-31 ENCOUNTER — Encounter: Payer: Self-pay | Admitting: Gastroenterology

## 2022-12-31 DIAGNOSIS — Z79899 Other long term (current) drug therapy: Secondary | ICD-10-CM | POA: Diagnosis not present

## 2022-12-31 DIAGNOSIS — M549 Dorsalgia, unspecified: Secondary | ICD-10-CM | POA: Diagnosis not present

## 2022-12-31 DIAGNOSIS — Z79891 Long term (current) use of opiate analgesic: Secondary | ICD-10-CM | POA: Diagnosis not present

## 2022-12-31 DIAGNOSIS — G894 Chronic pain syndrome: Secondary | ICD-10-CM | POA: Diagnosis not present

## 2023-01-01 ENCOUNTER — Other Ambulatory Visit: Payer: Self-pay | Admitting: Gastroenterology

## 2023-01-01 DIAGNOSIS — R112 Nausea with vomiting, unspecified: Secondary | ICD-10-CM

## 2023-01-05 NOTE — Progress Notes (Deleted)
Referring Provider: Smith Robert, MD Primary Care Physician:  Smith Robert, MD Primary GI Physician: Dr. Bonnetta Barry chief complaint on file.   HPI:   [name] is a 52 y.o. female with history of GERD, Candida esophagitis, chronic nausea, chronic constipation, chronic abdominal pain, colitis on CTs previously. She has been empirically treated for IBD/suspected Crohn's with prednisone and Entocort but has also been treated with antibiotics. It is unclear if she truly has IBD, but suspect more IBS. Follow-up colonoscopy February 2023 (off steroids for several months) with normal exam aside from nonbleeding internal hemorrhoids and normal colon biopsies.    Previously admitted in April 2024 after presenting with worsening abdominal pain and found to have sepsis in the setting of bacteremia, pyelonephritis, pneumonia, concern for colitis on CT.  C. difficile and GI pathogen panel were negative.  She was treated empirically with antibiotics.  Recommended outpatient colonoscopy. She was also found to have anemia with hemoglobin 8.4 on admission, down from 13.2 in January 2024, heme-negative, but iron and iron saturation low.  Hemoglobin declined as low as 6.5 with no overt GI bleeding.  She has been taking 1-2 BC powders daily for headaches outpatient.  She received 2 units PRBCs and hemoglobin improved to 12.5, 10.5 at the time of discharge.  We had initially considered EGD indication, but echocardiogram revealed EF of 25-30% with circumferential pericardial effusion.  Recommended against inpatient procedures.  Could consider outpatient once medically improved.   Repeat echo 5/21 with EF improved to 60-65%.    Last seen in office 11/30/2022.  She felt that her colitis was flaring again.  Reported having frequent watery diarrhea, primarily upper abdominal pain for the last 2 days, consistent with her prior flares.  Reported prior to this, she had been doing well with 1-2 formed stools daily with  Bentyl 20 mg twice daily.  Had not really been having any abdominal pain.  Her chronic nausea was well-controlled with Zofran and Phenergan.  She denies any recent antibiotics, travel, medication changes, sick contacts.  Denied NSAIDs.  GERD remains well-controlled. She was taking PPI BID and denied NISAD use (discontinued BC powders).  Unfortunately, due to global epic/computer outage, we were unable to obtain stat CT.  She was started on Cipro and Flagyl empirically for suspected colitis.  Labs and stool studies were ordered and advised patient to have them completed ASAP.  Would need colonoscopy and EGD once acute symptoms resolved for follow-up on colitis and IDA.    Labs/stool studies not completed.    Today:    Past Medical History:  Diagnosis Date   Chronic abdominal pain    Chronic back pain    Colitis    Constipation    GERD (gastroesophageal reflux disease)    Hypertension    Migraine    Plumbism    blood clot    PONV (postoperative nausea and vomiting)    Renal mass    Uterine cancer [facility])    age 52    Past Surgical History:  Procedure Laterality Date   ABDOMINAL HYSTERECTOMY     BACK SURGERY     BIOPSY  07/07/2021   Procedure: BIOPSY;  Surgeon: Lanelle Bal, DO;  [facility];  Service: Endoscopy;;   CHOLECYSTECTOMY     Complicated by bile leak requiring ERCP with temporary stenting   COLONOSCOPY WITH ESOPHAGOGASTRODUODENOSCOPY (EGD)  07/2009   [facility]: Op notes cannot be seen through care everywhere however pathology showed terminal  ileum and random colon biopsies normal.  Stomach biopsy with gastric antral and fundic mucosa with reactive foveolar hyperplasia.  No active gastritis.  Stains negative for H. pylori.   COLONOSCOPY WITH PROPOFOL N/A 07/07/2021   Surgeon: Earnest Bailey K, DO;  Non-bleeding internal hemorrhoids, normal TI  biopsied, normal colon biopsied. Pathology with colon and TI biopsies normal.   ESOPHAGEAL BRUSHING  07/07/2021    Procedure: ESOPHAGEAL BRUSHING;  Surgeon: Lanelle Bal, DO;  [facility];  Service: Endoscopy;;   ESOPHAGOGASTRODUODENOSCOPY  06/2007   [facility]: Normal esophagus, gastric mucosal abnormality with erythema, biopsy showed gastric antral and fundic mucosa with reactive foveolar hyperplasia, no gastritis, no H. pylori.   ESOPHAGOGASTRODUODENOSCOPY (EGD) WITH PROPOFOL N/A 07/07/2021   Surgeon: Lanelle Bal, DO; moderately severe Candida esophagitis, gastritis, biopsies negative for H. pylori, normal examined duodenum.  Prescribed Diflucan and recommended PPI twice daily.   HAND SURGERY      Current Outpatient Medications  Medication Sig Dispense Refill   acetaminophen (TYLENOL) 500 MG tablet Take 500 mg by mouth every 8 (eight) hours as needed.     albuterol (VENTOLIN HFA) 108 (90 Base) MCG/ACT inhaler Inhale 2 puffs into the lungs every 4 (four) hours as needed for shortness of breath or wheezing.     atorvastatin (LIPITOR) 40 MG tablet Take 1 tablet (40 mg total) by mouth daily. 90 tablet 3   ciprofloxacin (CIPRO) 500 MG tablet Take 1 tablet (500 mg total) by mouth 2 (two) times daily. 14 tablet 0   dicyclomine (BENTYL) 20 MG tablet Take 1 tablet (20 mg total) by mouth 4 (four) times daily -  before meals and at bedtime. TAKE ONE TABLET BY MOUTH THREE TIMES DAILY WITH FOOD. Hold for constipation 120 tablet 3   famotidine (PEPCID) 20 MG tablet Take 20 mg by mouth daily. For break through heart burn     FLUoxetine (PROZAC) 40 MG capsule Take 40 mg by mouth daily.     furosemide (LASIX) 40 MG tablet Take 1 tablet (40 mg total) by mouth daily as needed (swelling). 30 tablet 1   hydrOXYzine (ATARAX/VISTARIL) 50 MG tablet Take 50 mg by mouth every 6 (six) hours as needed for anxiety.     ipratropium-albuterol (DUONEB) 0.5-2.5 (3) MG/3ML SOLN Take 3 mLs by nebulization every 4 (four) hours as needed (SOB, wheezing, coughing). 360 mL 1   metoprolol succinate (TOPROL-XL) 100 MG 24 hr  tablet Take 1 tablet (100 mg total) by mouth daily. Take with or immediately following a meal. 30 tablet 3   metroNIDAZOLE (FLAGYL) 500 MG tablet Take 1 tablet (500 mg total) by mouth 3 (three) times daily. 21 tablet 0   Nebulizer System All-In-One MISC 1 Device by Does not apply route as directed. 1 each 0   NICODERM CQ 21 MG/24HR patch Place 21 mg onto the skin daily.     nitroGLYCERIN (NITROSTAT) 0.4 MG SL tablet Place 1 tablet (0.4 mg total) under the tongue every 5 (five) minutes as needed for chest pain. (Patient taking differently: Place 0.4 mg under the tongue every 5 (five) minutes x 3 doses as needed for chest pain (if no relief after 3rd dose, proceed to ED or call 911).) 15 tablet 2   ondansetron (ZOFRAN-ODT) 8 MG disintegrating tablet Take 1 tablet (8 mg total) by mouth every 8 (eight) hours. 60 tablet 3   pantoprazole (PROTONIX) 40 MG tablet TAKE (1) TABLET BY MOUTH TWICE A DAY BEFORE MEALS. (BREAKFAST AND SUPPER)  60 tablet 5   potassium chloride SA (KLOR-CON M) 20 MEQ tablet Take 1 tablet (20 mEq total) by mouth daily as needed. 30 tablet 3   promethazine (PHENERGAN) 25 MG tablet TAKE ONE TABLET BY MOUTH EVERY 6 HOURS AS NEEDED FOR NAUSEA OR VOMITING. 30 tablet 3   sacubitril-valsartan (ENTRESTO) 24-26 MG Take 1 tablet by mouth 2 (two) times daily. 60 tablet 6   SPIRIVA HANDIHALER 18 MCG inhalation capsule 1 capsule by inhaling the contents of the capsule using the HandiHaler device Inhalation Once a day for 30 days     spironolactone (ALDACTONE) 25 MG tablet Take 0.5 tablets (12.5 mg total) by mouth daily. 45 tablet 2   temazepam (RESTORIL) 30 MG capsule Take 30 mg by mouth at bedtime as needed for sleep.     No current facility-administered medications for this visit.    Allergies as of 01/07/2023 - Review Complete 11/30/2022  Allergen Reaction Noted   Buprenorphine Other (See Comments) 01/24/2016   Penicillins Swelling and Rash 07/08/2011   Aripiprazole Other (See Comments)  08/01/2020   Seroquel [quetiapine] Other (See Comments) 08/18/2020   Cefaclor Other (See Comments)    Doxycycline Rash 08/01/2020   Gabapentin Nausea And Vomiting 01/24/2016   Ibuprofen Swelling and Rash 07/08/2011   Levofloxacin Rash 01/24/2016   Lisinopril Nausea And Vomiting 08/01/2020   Macrobid [nitrofurantoin monohyd macro] Rash 11/30/2014   Naproxen Swelling and Rash 07/08/2011    Family History  Adopted: Yes  Problem Relation Age of Onset   Colon cancer Maternal Grandfather        47s   Colon cancer Maternal Uncle        18s   Cancer Other    Seizures Other    Stroke Other    Diabetes Other     Social History   Socioeconomic History   Marital status: Married    Spouse name: benito   Number of children: 2   Years of education: Not on file   Highest education level: Associate degree: occupational, Scientist, product/process development, or vocational program  Occupational History    Comment: not employed  Tobacco Use   Smoking status: Every Day    Current packs/day: 1.00    Average packs/day: 1 pack/day for 20.0 years (20.0 ttl pk-yrs)    Types: Cigarettes, E-cigarettes   Smokeless tobacco: Never  Vaping Use   Vaping status: Never Used  Substance and Sexual Activity   Alcohol use: No   Drug use: No   Sexual activity: Yes    Birth control/protection: Surgical  Other Topics Concern   Not on file  Social History Narrative   Not on file   Social Determinants of Health   Financial Resource Strain: Low Risk  (03/19/2017)   Overall Financial Resource Strain (CARDIA)    Difficulty of Paying Living Expenses: Not very hard  Food Insecurity: No Food Insecurity (03/19/2017)   Hunger Vital Sign    Worried About Running Out of Food in the Last Year: Never true    Ran Out of Food in the Last Year: Never true  Transportation Needs: No Transportation Needs (03/19/2017)   PRAPARE - Administrator, Civil Service (Medical): No    Lack of Transportation (Non-Medical): No  Physical  Activity: Inactive (03/19/2017)   Exercise Vital Sign    Days of Exercise per Week: 0 days    Minutes of Exercise per Session: 0 min  Stress: Stress Concern Present (03/19/2017)   [employer] of Occupational Health -  Occupational Stress Questionnaire    Feeling of Stress : To some extent  Social Connections: Somewhat Isolated (03/19/2017)   Social Connection and Isolation Panel [NHANES]    Frequency of Communication with Friends and Family: More than three times a week    Frequency of Social Gatherings with Friends and Family: Twice a week    Attends Religious Services: Never    Database administrator or Organizations: No    Attends Engineer, structural: Never    Marital Status: Married    Review of Systems: Gen: Denies fever, chills, anorexia. Denies fatigue, weakness, weight loss.  CV: Denies chest pain, palpitations, syncope, peripheral edema, and claudication. Resp: Denies dyspnea at rest, cough, wheezing, coughing up blood, and pleurisy. GI: Denies vomiting blood, jaundice, and fecal incontinence.   Denies dysphagia or odynophagia. Derm: Denies rash, itching, dry skin Psych: Denies depression, anxiety, memory loss, confusion. No homicidal or suicidal ideation.  Heme: Denies bruising, bleeding, and enlarged lymph nodes.  Physical Exam: There were no vitals taken for this visit. General:   Alert and oriented. No distress noted. Pleasant and cooperative.  Head:  Normocephalic and atraumatic. Eyes:  Conjuctiva clear without scleral icterus. Heart:  S1, S2 present without murmurs appreciated. Lungs:  Clear to auscultation bilaterally. No wheezes, rales, or rhonchi. No distress.  Abdomen:  +BS, soft, non-tender and non-distended. No rebound or guarding. No HSM or masses noted. Msk:  Symmetrical without gross deformities. Normal posture. Extremities:  Without edema. Neurologic:  Alert and  oriented x4 Psych:  Normal mood and affect.    Assessment:     Plan:   ***   Ermalinda Memos, PA-C [facility] Gastroenterology 01/07/2023

## 2023-01-07 ENCOUNTER — Ambulatory Visit: Payer: Medicaid Other | Admitting: Gastroenterology

## 2023-01-08 ENCOUNTER — Encounter (HOSPITAL_COMMUNITY): Payer: Self-pay

## 2023-01-08 ENCOUNTER — Ambulatory Visit (HOSPITAL_COMMUNITY)
Admission: RE | Admit: 2023-01-08 | Discharge: 2023-01-08 | Disposition: A | Discharge status: Home / Self Care | Payer: Medicaid Other | Source: Ambulatory Visit | Attending: Nurse Practitioner | Admitting: Nurse Practitioner

## 2023-01-08 DIAGNOSIS — N644 Mastodynia: Secondary | ICD-10-CM

## 2023-01-08 DIAGNOSIS — R921 Mammographic calcification found on diagnostic imaging of breast: Secondary | ICD-10-CM | POA: Diagnosis not present

## 2023-01-08 DIAGNOSIS — R92333 Mammographic heterogeneous density, bilateral breasts: Secondary | ICD-10-CM | POA: Diagnosis not present

## 2023-01-14 DIAGNOSIS — J449 Chronic obstructive pulmonary disease, unspecified: Secondary | ICD-10-CM | POA: Diagnosis not present

## 2023-01-24 ENCOUNTER — Encounter: Payer: Self-pay | Admitting: Urology

## 2023-01-24 ENCOUNTER — Ambulatory Visit (INDEPENDENT_AMBULATORY_CARE_PROVIDER_SITE_OTHER): Payer: Medicaid Other | Admitting: Urology

## 2023-01-24 VITALS — BP 134/77 | HR 70

## 2023-01-24 DIAGNOSIS — N390 Urinary tract infection, site not specified: Secondary | ICD-10-CM | POA: Diagnosis not present

## 2023-01-24 DIAGNOSIS — Z87440 Personal history of urinary (tract) infections: Secondary | ICD-10-CM

## 2023-01-24 DIAGNOSIS — N2889 Other specified disorders of kidney and ureter: Secondary | ICD-10-CM

## 2023-01-24 DIAGNOSIS — N281 Cyst of kidney, acquired: Secondary | ICD-10-CM

## 2023-01-24 MED ORDER — DIAZEPAM 10 MG PO TABS
ORAL_TABLET | ORAL | 0 refills | Status: DC
Start: 2023-01-24 — End: 2023-06-14

## 2023-01-24 NOTE — Progress Notes (Signed)
Subjective: 1. Cyst of kidney, acquired   2. Complex renal cyst   3. Urinary tract infection without hematuria, site unspecified   4. Other specified disorders of kidney and ureter      Consult requested by Dr. Celso Amy.  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  She has had prior MRI's and the cyst has been stable since at least 2020 but has enlarged since 2014.  She has some pain intermittently in the left  flank for a few years.  She has had no recent hematuria.  She had left pyelonephritis with e. Coli but a.  treated in April.  Her Cr was 0.57 in 4/24.  She also had sepsis and pneumonia when she was admitted in April.  She has CHF and is on a diuretic so she has frequency.  Her UA has >30 WBC, many bacteria and nit +.  She has no dysuria.  She has Crohn's disease with recurrent colitis but no fecaluria or pneumoturia.  He has an abdominal MRI ordered by Dr. Wolfgang Phoenix.  ROS:  Review of Systems  Cardiovascular:  Positive for chest pain and leg swelling.  Gastrointestinal:  Positive for constipation, diarrhea, heartburn and vomiting.  Musculoskeletal:  Positive for back pain and joint pain.  Neurological:  Positive for weakness.  Psychiatric/Behavioral:  Positive for depression.     Allergies  Allergen Reactions   Buprenorphine Other (See Comments)    Chest pain/ringing in ears and feet swelling   Penicillins Swelling and Rash        Aripiprazole Other (See Comments)    seizure   Seroquel [Quetiapine] Other (See Comments)    Seizure (08/18/20)   Cefaclor Other (See Comments)    Muscles locked up   Doxycycline Rash   Gabapentin Nausea And Vomiting    dizzy   Ibuprofen Swelling and Rash   Levofloxacin Rash    Itching   Lisinopril Nausea And Vomiting   Macrobid [Nitrofurantoin Monohyd Macro] Rash   Naproxen Swelling and Rash    Past Medical History:  Diagnosis Date   Chronic abdominal  pain    Chronic back pain    Colitis    Constipation    GERD (gastroesophageal reflux disease)    Hypertension    Migraine    Plumbism    blood clot    PONV (postoperative nausea and vomiting)    Renal mass    Uterine cancer [facility])    age 78    Past Surgical History:  Procedure Laterality Date   ABDOMINAL HYSTERECTOMY     BACK SURGERY     BIOPSY  07/07/2021   Procedure: BIOPSY;  Surgeon: Lanelle Bal, DO;  [facility]: [facility];  Service: Endoscopy;;   CHOLECYSTECTOMY     Complicated by bile leak requiring ERCP with temporary stenting   COLONOSCOPY WITH ESOPHAGOGASTRODUODENOSCOPY (EGD)  07/2009   [facility]: Op notes cannot be seen through care everywhere however pathology showed terminal ileum and random colon biopsies normal.  Stomach biopsy with gastric antral and fundic mucosa with reactive foveolar hyperplasia.  No active gastritis.  Stains negative for H. pylori.   COLONOSCOPY WITH PROPOFOL N/A 07/07/2021   Surgeon: Earnest Bailey K, DO;  Non-bleeding internal hemorrhoids, normal TI  biopsied, normal colon biopsied. Pathology with colon and TI biopsies normal.   ESOPHAGEAL BRUSHING  07/07/2021   Procedure: ESOPHAGEAL BRUSHING;  Surgeon:  Lanelle Bal, DO;  Location: [facility];  Service: Endoscopy;;   ESOPHAGOGASTRODUODENOSCOPY  06/2007   [facility]: Normal esophagus, gastric mucosal abnormality with erythema, biopsy showed gastric antral and fundic mucosa with reactive foveolar hyperplasia, no gastritis, no H. pylori.   ESOPHAGOGASTRODUODENOSCOPY (EGD) WITH PROPOFOL N/A 07/07/2021   Surgeon: Lanelle Bal, DO; moderately severe Candida esophagitis, gastritis, biopsies negative for H. pylori, normal examined duodenum.  Prescribed Diflucan and recommended PPI twice daily.   HAND SURGERY      Social History   Socioeconomic History   Marital status: Married    Spouse name: benito   Number of children: 2   Years of education: Not on file   Highest education  level: Associate degree: occupational, Scientist, product/process development, or vocational program  Occupational History    Comment: not employed  Tobacco Use   Smoking status: Every Day    Current packs/day: 1.00    Average packs/day: 1 pack/day for 20.0 years (20.0 ttl pk-yrs)    Types: Cigarettes, E-cigarettes   Smokeless tobacco: Never  Vaping Use   Vaping status: Never Used  Substance and Sexual Activity   Alcohol use: No   Drug use: No   Sexual activity: Yes    Birth control/protection: Surgical  Other Topics Concern   Not on file  Social History Narrative   Not on file   Social Determinants of Health   Financial Resource Strain: Low Risk  (03/19/2017)   Overall Financial Resource Strain (CARDIA)    Difficulty of Paying Living Expenses: Not very hard  Food Insecurity: No Food Insecurity (03/19/2017)   Hunger Vital Sign    Worried About Running Out of Food in the Last Year: Never true    Ran Out of Food in the Last Year: Never true  Transportation Needs: No Transportation Needs (03/19/2017)   PRAPARE - Administrator, Civil Service (Medical): No    Lack of Transportation (Non-Medical): No  Physical Activity: Inactive (03/19/2017)   Exercise Vital Sign    Days of Exercise per Week: 0 days    Minutes of Exercise per Session: 0 min  Stress: Stress Concern Present (03/19/2017)   [employer] of Occupational Health - Occupational Stress Questionnaire    Feeling of Stress : To some extent  Social Connections: Somewhat Isolated (03/19/2017)   Social Connection and Isolation Panel [NHANES]    Frequency of Communication with Friends and Family: More than three times a week    Frequency of Social Gatherings with Friends and Family: Twice a week    Attends Religious Services: Never    Database administrator or Organizations: No    Attends Banker Meetings: Never    Marital Status: Married  Catering manager Violence: Not At Risk (03/19/2017)   Humiliation, Afraid, Rape, and Kick  questionnaire    Fear of Current or Ex-Partner: No    Emotionally Abused: No    Physically Abused: No    Sexually Abused: No    Family History  Adopted: Yes  Problem Relation Age of Onset   Colon cancer Maternal Grandfather        32s   Colon cancer Maternal Uncle        24s   Cancer Other    Seizures Other    Stroke Other    Diabetes Other     Anti-infectives: Anti-infectives (From admission, onward)    None       Current Outpatient Medications  Medication Sig Dispense Refill  albuterol (VENTOLIN HFA) 108 (90 Base) MCG/ACT inhaler Inhale 2 puffs into the lungs every 4 (four) hours as needed for shortness of breath or wheezing.     atorvastatin (LIPITOR) 40 MG tablet Take 1 tablet (40 mg total) by mouth daily. 90 tablet 3   diazepam (VALIUM) 10 MG tablet Take 1-2 tabs 1 hour prior to the MRI. 2 tablet 0   dicyclomine (BENTYL) 20 MG tablet Take 1 tablet (20 mg total) by mouth 4 (four) times daily -  before meals and at bedtime. TAKE ONE TABLET BY MOUTH THREE TIMES DAILY WITH FOOD. Hold for constipation 120 tablet 3   famotidine (PEPCID) 20 MG tablet Take 20 mg by mouth daily. For break through heart burn     FLUoxetine (PROZAC) 40 MG capsule Take 40 mg by mouth daily.     furosemide (LASIX) 40 MG tablet Take 1 tablet (40 mg total) by mouth daily as needed (swelling). 30 tablet 1   hydrOXYzine (ATARAX/VISTARIL) 50 MG tablet Take 50 mg by mouth every 6 (six) hours as needed for anxiety.     ipratropium-albuterol (DUONEB) 0.5-2.5 (3) MG/3ML SOLN Take 3 mLs by nebulization every 4 (four) hours as needed (SOB, wheezing, coughing). 360 mL 1   metoprolol succinate (TOPROL-XL) 100 MG 24 hr tablet Take 1 tablet (100 mg total) by mouth daily. Take with or immediately following a meal. 30 tablet 3   metroNIDAZOLE (FLAGYL) 500 MG tablet Take 1 tablet (500 mg total) by mouth 3 (three) times daily. 21 tablet 0   Nebulizer System All-In-One MISC 1 Device by Does not apply route as directed.  1 each 0   NICODERM CQ 21 MG/24HR patch Place 21 mg onto the skin daily.     nitroGLYCERIN (NITROSTAT) 0.4 MG SL tablet Place 1 tablet (0.4 mg total) under the tongue every 5 (five) minutes as needed for chest pain. (Patient taking differently: Place 0.4 mg under the tongue every 5 (five) minutes x 3 doses as needed for chest pain (if no relief after 3rd dose, proceed to ED or call 911).) 15 tablet 2   ondansetron (ZOFRAN-ODT) 8 MG disintegrating tablet Take 1 tablet (8 mg total) by mouth every 8 (eight) hours. 60 tablet 3   pantoprazole (PROTONIX) 40 MG tablet TAKE (1) TABLET BY MOUTH TWICE A DAY BEFORE MEALS. (BREAKFAST AND SUPPER) 60 tablet 5   potassium chloride SA (KLOR-CON M) 20 MEQ tablet Take 1 tablet (20 mEq total) by mouth daily as needed. 30 tablet 3   promethazine (PHENERGAN) 25 MG tablet TAKE ONE TABLET BY MOUTH EVERY 6 HOURS AS NEEDED FOR NAUSEA OR VOMITING. 30 tablet 3   sacubitril-valsartan (ENTRESTO) 24-26 MG Take 1 tablet by mouth 2 (two) times daily. 60 tablet 6   SPIRIVA HANDIHALER 18 MCG inhalation capsule 1 capsule by inhaling the contents of the capsule using the HandiHaler device Inhalation Once a day for 30 days     spironolactone (ALDACTONE) 25 MG tablet Take 0.5 tablets (12.5 mg total) by mouth daily. 45 tablet 2   temazepam (RESTORIL) 30 MG capsule Take 30 mg by mouth at bedtime as needed for sleep.     acetaminophen (TYLENOL) 500 MG tablet Take 500 mg by mouth every 8 (eight) hours as needed.     No current facility-administered medications for this visit.     Objective: Vital signs in last 24 hours: BP 134/77   Pulse 70   Intake/Output from previous day: No intake/output data recorded. Intake/Output this shift: @  IOTHISSHIFT@   Physical Exam Vitals reviewed.  Constitutional:      Appearance: Normal appearance.  Cardiovascular:     Rate and Rhythm: Normal rate and regular rhythm.     Heart sounds: Normal heart sounds.  Pulmonary:     Effort: Pulmonary  effort is normal.     Breath sounds: Normal breath sounds.  Abdominal:     General: Abdomen is flat.     Palpations: Abdomen is soft.     Tenderness: There is abdominal tenderness (diffuse, left > right.).  Neurological:     Mental Status: She is alert.     Lab Results:  Results for orders placed or performed in visit on 01/24/23 (from the past 24 hour(s))  Urinalysis, Routine w reflex microscopic     Status: Abnormal   Collection Time: 01/24/23  2:50 PM  Result Value Ref Range   Specific Gravity, UA 1.010 1.005 - 1.030   pH, UA 6.0 5.0 - 7.5   Color, UA Yellow Yellow   Appearance Ur Cloudy (A) Clear   Leukocytes,UA 3+ (A) Negative   Protein,UA Negative Negative/Trace   Glucose, UA Negative Negative   Ketones, UA Negative Negative   RBC, UA Trace (A) Negative   Bilirubin, UA Negative Negative   Urobilinogen, Ur 0.2 0.2 - 1.0 mg/dL   Nitrite, UA Positive (A) Negative   Microscopic Examination See below:    Narrative   Performed at:  [address] - [facility] [address], [place], [place]  [id number] Lab Director: Chinita Pester MT, Phone:  [phone]  Microscopic Examination     Status: Abnormal   Collection Time: 01/24/23  2:50 PM   Urine  Result Value Ref Range   WBC, UA >30 (A) 0 - 5 /hpf   RBC, Urine 0-2 0 - 2 /hpf   Epithelial Cells (non renal) 0-10 0 - 10 /hpf   Bacteria, UA Many (A) None seen/Few   Narrative   Performed at:  [address] - [facility] [address], [place], [place]  [id number] Lab Director: Chinita Pester MT, Phone:  [phone]    BMET No results for input(s): "NA", "K", "CL", "CO2", "GLUCOSE", "BUN", "CREATININE", "CALCIUM" in the last 72 hours. PT/INR No results for input(s): "LABPROT", "INR" in the last 72 hours. ABG No results for input(s): "PHART", "HCO3" in the last 72 hours.  Invalid input(s): "PCO2", "PO2" I have reviewed her recent labs and cultures.  Studies/Results: No results found. I have reviewed CT's and MRI's back  to 2014.   Assessment/Plan: Complex left upper pole cyst.  I will get an MRI and have her return with the results.  UTI.  I will get a culture and treat accordingly.   Meds ordered this encounter  Medications   diazepam (VALIUM) 10 MG tablet    Sig: Take 1-2 tabs 1 hour prior to the MRI.    Dispense:  2 tablet    Refill:  0     Orders Placed This Encounter  Procedures   Urine Culture   Microscopic Examination   MR ABDOMEN W WO CONTRAST    Standing Status:   Future    Standing Expiration Date:   07/24/2023    Order Specific Question:   If indicated for the ordered procedure, I authorize the administration of contrast media per Radiology protocol    Answer:   Yes    Order Specific Question:   What is the patient's sedation requirement?    Answer:   No Sedation  Order Specific Question:   Does the patient have a pacemaker or implanted devices?    Answer:   No    Order Specific Question:   Radiology Contrast Protocol - do NOT remove file path    Answer:   [email or web address]    Order Specific Question:   Preferred imaging location?    Answer:   [facility] (table limit-350lbs)   Urinalysis, Routine w reflex microscopic   Urinalysis, Routine w reflex microscopic     Return for Next available with MRI results. .    CC: Dr. Celso Amy     Bjorn Pippin 01/25/2023

## 2023-01-25 LAB — URINALYSIS, ROUTINE W REFLEX MICROSCOPIC
Bilirubin, UA: NEGATIVE
Glucose, UA: NEGATIVE
Ketones, UA: NEGATIVE
Nitrite, UA: POSITIVE — AB
Protein,UA: NEGATIVE
Specific Gravity, UA: 1.01 (ref 1.005–1.030)
Urobilinogen, Ur: 0.2 mg/dL (ref 0.2–1.0)
pH, UA: 6 (ref 5.0–7.5)

## 2023-01-25 LAB — MICROSCOPIC EXAMINATION: WBC, UA: >30 /HPF — AB (ref 0–5)

## 2023-01-27 LAB — URINE CULTURE

## 2023-01-27 NOTE — Progress Notes (Deleted)
[name], female    DOB: [birth year]    MRN: [id number]   Brief patient profile:  52  yo*** *** referred to pulmonary clinic in [place]  01/28/2023 by *** for ***      History of Present Illness  01/28/2023  Pulmonary/ 1st office eval/ Sherene Sires / [place] [facility]  No chief complaint on file.    Dyspnea:  *** Cough: *** Sleep: *** SABA use: *** 02: *** Lung cancer screen: ***   Outpatient Medications Prior to Visit  Medication Sig Dispense Refill   acetaminophen (TYLENOL) 500 MG tablet Take 500 mg by mouth every 8 (eight) hours as needed.     albuterol (VENTOLIN HFA) 108 (90 Base) MCG/ACT inhaler Inhale 2 puffs into the lungs every 4 (four) hours as needed for shortness of breath or wheezing.     atorvastatin (LIPITOR) 40 MG tablet Take 1 tablet (40 mg total) by mouth daily. 90 tablet 3   diazepam (VALIUM) 10 MG tablet Take 1-2 tabs 1 hour prior to the MRI. 2 tablet 0   dicyclomine (BENTYL) 20 MG tablet Take 1 tablet (20 mg total) by mouth 4 (four) times daily -  before meals and at bedtime. TAKE ONE TABLET BY MOUTH THREE TIMES DAILY WITH FOOD. Hold for constipation 120 tablet 3   famotidine (PEPCID) 20 MG tablet Take 20 mg by mouth daily. For break through heart burn     FLUoxetine (PROZAC) 40 MG capsule Take 40 mg by mouth daily.     furosemide (LASIX) 40 MG tablet Take 1 tablet (40 mg total) by mouth daily as needed (swelling). 30 tablet 1   hydrOXYzine (ATARAX/VISTARIL) 50 MG tablet Take 50 mg by mouth every 6 (six) hours as needed for anxiety.     ipratropium-albuterol (DUONEB) 0.5-2.5 (3) MG/3ML SOLN Take 3 mLs by nebulization every 4 (four) hours as needed (SOB, wheezing, coughing). 360 mL 1   metoprolol succinate (TOPROL-XL) 100 MG 24 hr tablet Take 1 tablet (100 mg total) by mouth daily. Take with or immediately following a meal. 30 tablet 3   metroNIDAZOLE (FLAGYL) 500 MG tablet Take 1 tablet (500 mg total) by mouth 3 (three) times daily. 21 tablet 0   Nebulizer  System All-In-One MISC 1 Device by Does not apply route as directed. 1 each 0   NICODERM CQ 21 MG/24HR patch Place 21 mg onto the skin daily.     nitroGLYCERIN (NITROSTAT) 0.4 MG SL tablet Place 1 tablet (0.4 mg total) under the tongue every 5 (five) minutes as needed for chest pain. (Patient taking differently: Place 0.4 mg under the tongue every 5 (five) minutes x 3 doses as needed for chest pain (if no relief after 3rd dose, proceed to ED or call 911).) 15 tablet 2   ondansetron (ZOFRAN-ODT) 8 MG disintegrating tablet Take 1 tablet (8 mg total) by mouth every 8 (eight) hours. 60 tablet 3   pantoprazole (PROTONIX) 40 MG tablet TAKE (1) TABLET BY MOUTH TWICE A DAY BEFORE MEALS. (BREAKFAST AND SUPPER) 60 tablet 5   potassium chloride SA (KLOR-CON M) 20 MEQ tablet Take 1 tablet (20 mEq total) by mouth daily as needed. 30 tablet 3   promethazine (PHENERGAN) 25 MG tablet TAKE ONE TABLET BY MOUTH EVERY 6 HOURS AS NEEDED FOR NAUSEA OR VOMITING. 30 tablet 3   sacubitril-valsartan (ENTRESTO) 24-26 MG Take 1 tablet by mouth 2 (two) times daily. 60 tablet 6   SPIRIVA HANDIHALER 18 MCG inhalation capsule 1 capsule by inhaling  the contents of the capsule using the HandiHaler device Inhalation Once a day for 30 days     spironolactone (ALDACTONE) 25 MG tablet Take 0.5 tablets (12.5 mg total) by mouth daily. 45 tablet 2   temazepam (RESTORIL) 30 MG capsule Take 30 mg by mouth at bedtime as needed for sleep.     No facility-administered medications prior to visit.    Past Medical History:  Diagnosis Date   Chronic abdominal pain    Chronic back pain    Colitis    Constipation    GERD (gastroesophageal reflux disease)    Hypertension    Migraine    Plumbism    blood clot    PONV (postoperative nausea and vomiting)    Renal mass    Uterine cancer [facility])    age 30      Objective:     There were no vitals taken for this visit.         Assessment   No problem-specific Assessment & Plan notes  found for this encounter.     [name], MD 01/27/2023

## 2023-01-28 ENCOUNTER — Institutional Professional Consult (permissible substitution): Payer: Medicaid Other | Admitting: Internal Medicine

## 2023-01-28 DIAGNOSIS — M549 Dorsalgia, unspecified: Secondary | ICD-10-CM | POA: Diagnosis not present

## 2023-01-28 DIAGNOSIS — Z79891 Long term (current) use of opiate analgesic: Secondary | ICD-10-CM | POA: Diagnosis not present

## 2023-01-28 DIAGNOSIS — G894 Chronic pain syndrome: Secondary | ICD-10-CM | POA: Diagnosis not present

## 2023-01-29 ENCOUNTER — Telehealth: Payer: Self-pay

## 2023-01-29 MED ORDER — FOSFOMYCIN TROMETHAMINE 3 G PO PACK: 3.0000 g | PACK | ORAL | 0 refills | Status: AC

## 2023-01-29 NOTE — Telephone Encounter (Signed)
Fosfomycin sent to pharmacy, patient is aware of MD response to culture.  She will call back if symptoms persist.

## 2023-01-29 NOTE — Telephone Encounter (Signed)
-----   Message from Bjorn Pippin sent at 01/28/2023 11:58 AM EDT ----- She has a multidrug resistant e. Coli.   We can try fosfomycin 3mg  po every 72 hours x 3 doses.   If that doesn't work, she will need to be referred to infectious disease. ----- Message ----- From: Grier Rocher, CMA Sent: 01/28/2023  11:27 AM EDT To: Bjorn Pippin, MD  Please review urine culture

## 2023-02-01 ENCOUNTER — Ambulatory Visit (HOSPITAL_COMMUNITY)
Admission: RE | Admit: 2023-02-01 | Discharge: 2023-02-01 | Disposition: A | Discharge status: Home / Self Care | Payer: Medicaid Other | Source: Ambulatory Visit | Attending: Urology | Admitting: Urology

## 2023-02-01 DIAGNOSIS — N281 Cyst of kidney, acquired: Secondary | ICD-10-CM | POA: Insufficient documentation

## 2023-02-01 DIAGNOSIS — Z9049 Acquired absence of other specified parts of digestive tract: Secondary | ICD-10-CM | POA: Diagnosis not present

## 2023-02-01 DIAGNOSIS — N2889 Other specified disorders of kidney and ureter: Secondary | ICD-10-CM | POA: Insufficient documentation

## 2023-02-01 DIAGNOSIS — K838 Other specified diseases of biliary tract: Secondary | ICD-10-CM | POA: Diagnosis not present

## 2023-02-01 MED ORDER — GADOBUTROL 1 MMOL/ML IV SOLN
6.0000 mL | Freq: Once | INTRAVENOUS | Status: AC | PRN
  Administered 2023-02-01: 6 mL via INTRAVENOUS

## 2023-02-11 ENCOUNTER — Other Ambulatory Visit: Payer: Self-pay | Admitting: Gastroenterology

## 2023-02-11 ENCOUNTER — Encounter: Payer: Self-pay | Admitting: Internal Medicine

## 2023-02-11 ENCOUNTER — Ambulatory Visit (INDEPENDENT_AMBULATORY_CARE_PROVIDER_SITE_OTHER): Payer: Medicaid Other | Admitting: Internal Medicine

## 2023-02-11 VITALS — BP 114/74 | HR 66 | Ht 61.0 in | Wt 133.4 lb

## 2023-02-11 DIAGNOSIS — K219 Gastro-esophageal reflux disease without esophagitis: Secondary | ICD-10-CM

## 2023-02-11 DIAGNOSIS — R112 Nausea with vomiting, unspecified: Secondary | ICD-10-CM

## 2023-02-11 DIAGNOSIS — N182 Chronic kidney disease, stage 2 (mild): Secondary | ICD-10-CM | POA: Insufficient documentation

## 2023-02-11 DIAGNOSIS — Z1329 Encounter for screening for other suspected endocrine disorder: Secondary | ICD-10-CM

## 2023-02-11 DIAGNOSIS — F419 Anxiety disorder, unspecified: Secondary | ICD-10-CM

## 2023-02-11 DIAGNOSIS — J449 Chronic obstructive pulmonary disease, unspecified: Secondary | ICD-10-CM | POA: Diagnosis not present

## 2023-02-11 DIAGNOSIS — G894 Chronic pain syndrome: Secondary | ICD-10-CM

## 2023-02-11 DIAGNOSIS — Z1159 Encounter for screening for other viral diseases: Secondary | ICD-10-CM | POA: Diagnosis not present

## 2023-02-11 DIAGNOSIS — F5104 Psychophysiologic insomnia: Secondary | ICD-10-CM | POA: Diagnosis not present

## 2023-02-11 DIAGNOSIS — N281 Cyst of kidney, acquired: Secondary | ICD-10-CM | POA: Insufficient documentation

## 2023-02-11 DIAGNOSIS — G8929 Other chronic pain: Secondary | ICD-10-CM

## 2023-02-11 DIAGNOSIS — I10 Essential (primary) hypertension: Secondary | ICD-10-CM | POA: Diagnosis not present

## 2023-02-11 DIAGNOSIS — E785 Hyperlipidemia, unspecified: Secondary | ICD-10-CM | POA: Insufficient documentation

## 2023-02-11 DIAGNOSIS — I428 Other cardiomyopathies: Secondary | ICD-10-CM | POA: Diagnosis not present

## 2023-02-11 DIAGNOSIS — F333 Major depressive disorder, recurrent, severe with psychotic symptoms: Secondary | ICD-10-CM | POA: Insufficient documentation

## 2023-02-11 DIAGNOSIS — E782 Mixed hyperlipidemia: Secondary | ICD-10-CM | POA: Diagnosis not present

## 2023-02-11 DIAGNOSIS — Z720 Tobacco use: Secondary | ICD-10-CM | POA: Insufficient documentation

## 2023-02-11 DIAGNOSIS — Z131 Encounter for screening for diabetes mellitus: Secondary | ICD-10-CM | POA: Diagnosis not present

## 2023-02-11 DIAGNOSIS — Z2821 Immunization not carried out because of patient refusal: Secondary | ICD-10-CM | POA: Diagnosis not present

## 2023-02-11 DIAGNOSIS — G4700 Insomnia, unspecified: Secondary | ICD-10-CM | POA: Insufficient documentation

## 2023-02-11 DIAGNOSIS — E559 Vitamin D deficiency, unspecified: Secondary | ICD-10-CM | POA: Diagnosis not present

## 2023-02-11 MED ORDER — FLUOXETINE HCL 40 MG PO CAPS
80.0000 mg | ORAL_CAPSULE | Freq: Every day | ORAL | 1 refills | Status: DC
Start: 2023-02-11 — End: 2023-06-14

## 2023-02-11 MED ORDER — NICOTINE 14 MG/24HR TD PT24: 14.0000 mg | MEDICATED_PATCH | Freq: Every day | TRANSDERMAL | 0 refills | Status: DC

## 2023-02-11 NOTE — Patient Instructions (Signed)
It was a pleasure to see you today.  Thank you for giving [place] the opportunity to be involved in your care.  Below is a brief recap of your visit and next steps.  We will plan to see you again in 3 months.  Summary You have established care today Repeat labs ordered Fluoxetine refilled Nicoderm patches 14 mg prescribed Psychiatry referral placed We will plan for follow up in 3 months I will fill temazepam pending completion of controled substance agreement and urine drug screen

## 2023-02-11 NOTE — Assessment & Plan Note (Signed)
Followed by nephrology (Dr. Wolfgang Phoenix).  Repeat labs ordered today.

## 2023-02-11 NOTE — Assessment & Plan Note (Addendum)
Symptoms are adequately controlled with Protonix 40 mg twice daily.  Followed by GI.  No changes have been made today.

## 2023-02-11 NOTE — Assessment & Plan Note (Signed)
Her acute concern today is requesting a refill of temazepam for treatment of insomnia.  She has previously been prescribed temazepam 30 mg nightly as needed for insomnia.  PDMP reviewed.  Last filled in May.  Reports that she does not take it every night, only when needed. -Prescription not provided today.  I have requested that she sign a controlled substance agreement and complete UDS.  Can fill temazepam upon completion and review.  She has been referred to psychiatry today to establish care.

## 2023-02-11 NOTE — Assessment & Plan Note (Addendum)
EF 25-30% on TTE during hospital admission in April for sepsis secondary to pyelonephritis and E. coli bacteremia.  Started on appropriate GDMT.  EF recovered on repeat TTE in May.  She is euvolemic on exam today.  Followed by cardiology.  Currently prescribed Entresto, Aldactone, Toprol-XL, and Lasix daily as needed.  No medication changes are indicated today.

## 2023-02-11 NOTE — Assessment & Plan Note (Signed)
Lipid panel updated in April.  Total cholesterol 300 and LDL 199.  Atorvastatin was increased to 40 mg daily as a result. -Repeat lipid panel ordered today

## 2023-02-11 NOTE — Assessment & Plan Note (Signed)
Currently smokes 0.5 packs/day and has previously smoked at a higher quantity.  She has been smoking since age 52.  Currently interested in cessation and has tried NicoDerm 21 mg per 24-hour patches but states that they are too strong.  She would like to continue patches at a lower dose. -The patient was counseled on the dangers of tobacco use, and was advised to quit.  Reviewed strategies to maximize success, including removing cigarettes and smoking materials from environment, stress management, substitution of other forms of reinforcement, support of family/friends, and written materials. -NicoDerm patches 14 mg /24 hr prescribed today

## 2023-02-11 NOTE — Assessment & Plan Note (Addendum)
Previously followed by psychiatry.  She is currently prescribed fluoxetine 80 mg daily and hydroxyzine 50 mg as needed for anxiety relief.  Refill of fluoxetine requested today.  She is also interested in establishing care with a new psychiatrist.  Mood is currently stable and anxiety adequately controlled. -Fluoxetine has been refilled -Psychiatry referral placed today

## 2023-02-11 NOTE — Progress Notes (Signed)
New Patient Office Visit  Subjective    Patient ID: [name], female    DOB: 07/14/1970  Age: 52 y.o. MRN: [id number]  CC:  Chief Complaint  Patient presents with   New Patient (Initial Visit)    New patient establish care, sees pain management needing refills on sleep medication previous provider retired.     HPI [name] presents to establish care.  She is a 51 year old woman who endorses a past medical history significant for anxiety and depression, insomnia, HFrEF (EF recovered)/NICM, chronic abdominal pain, COPD, HLD, chronic nausea, GERD, HTN, complex renal cyst, CKD stage II, and current tobacco use.  Previously followed by Dr. Bryna Colander at [facility].  [name] reports feeling well today.  She is asymptomatic currently.  Her acute concerns are requesting medication refills for treatment of depression and insomnia.  She is currently prescribed fluoxetine 80 mg daily.  She received a 30-day supply from her previous PCP last month.  A refill was requested today.  She additionally requests a refill of temazepam.  She has previously been prescribed 30 mg nightly as needed for treatment of insomnia.  She has been out of medication for a week and is sleeping poorly.  [name] was previously followed by psychiatry and would like to reestablish care with a new psychiatrist.  A referral is requested today.  She is currently unemployed and filing for disability in the setting of chronic pain and complex medical conditions.  She currently smokes 0.5 packs/day of cigarettes and has smoked more in the past.  She has been smoking since age 15.  [name] denies alcohol and illicit drug use.  Her family medical history is significant for lung and breast cancer as well as CAD.  Acute concerns, chronic medical conditions, and outstanding preventative care items discussed today are individually addressed in A/P below.  Outpatient Encounter Medications as of 02/11/2023  Medication Sig    albuterol (VENTOLIN HFA) 108 (90 Base) MCG/ACT inhaler Inhale 2 puffs into the lungs every 4 (four) hours as needed for shortness of breath or wheezing.   atorvastatin (LIPITOR) 40 MG tablet Take 1 tablet (40 mg total) by mouth daily.   diazepam (VALIUM) 10 MG tablet Take 1-2 tabs 1 hour prior to the MRI.   dicyclomine (BENTYL) 20 MG tablet Take 1 tablet (20 mg total) by mouth 4 (four) times daily -  before meals and at bedtime. TAKE ONE TABLET BY MOUTH THREE TIMES DAILY WITH FOOD. Hold for constipation   famotidine (PEPCID) 20 MG tablet Take 20 mg by mouth daily. For break through heart burn   furosemide (LASIX) 40 MG tablet Take 1 tablet (40 mg total) by mouth daily as needed (swelling).   hydrOXYzine (ATARAX/VISTARIL) 50 MG tablet Take 50 mg by mouth every 6 (six) hours as needed for anxiety.   ipratropium-albuterol (DUONEB) 0.5-2.5 (3) MG/3ML SOLN Take 3 mLs by nebulization every 4 (four) hours as needed (SOB, wheezing, coughing).   metoprolol succinate (TOPROL-XL) 100 MG 24 hr tablet Take 1 tablet (100 mg total) by mouth daily. Take with or immediately following a meal.   Nebulizer System All-In-One MISC 1 Device by Does not apply route as directed.   nicotine (NICODERM CQ) 14 mg/24hr patch Place 1 patch (14 mg total) onto the skin daily.   nitroGLYCERIN (NITROSTAT) 0.4 MG SL tablet Place 1 tablet (0.4 mg total) under the tongue every 5 (five) minutes as needed for chest pain. (Patient taking differently: Place  0.4 mg under the tongue every 5 (five) minutes x 3 doses as needed for chest pain (if no relief after 3rd dose, proceed to ED or call 911).)   ondansetron (ZOFRAN-ODT) 8 MG disintegrating tablet Take 1 tablet (8 mg total) by mouth every 8 (eight) hours.   Oxycodone HCl 10 MG TABS Take 10 mg by mouth every 8 (eight) hours as needed.   pantoprazole (PROTONIX) 40 MG tablet TAKE (1) TABLET BY MOUTH TWICE A DAY BEFORE MEALS. (BREAKFAST AND SUPPER)   potassium chloride SA (KLOR-CON M) 20 MEQ  tablet Take 1 tablet (20 mEq total) by mouth daily as needed.   promethazine (PHENERGAN) 25 MG tablet TAKE ONE TABLET BY MOUTH EVERY 6 HOURS AS NEEDED FOR NAUSEA OR VOMITING.   sacubitril-valsartan (ENTRESTO) 24-26 MG Take 1 tablet by mouth 2 (two) times daily.   SPIRIVA HANDIHALER 18 MCG inhalation capsule 1 capsule by inhaling the contents of the capsule using the HandiHaler device Inhalation Once a day for 30 days   spironolactone (ALDACTONE) 25 MG tablet Take 0.5 tablets (12.5 mg total) by mouth daily.   [DISCONTINUED] FLUoxetine (PROZAC) 40 MG capsule Take 40 mg by mouth daily.   [DISCONTINUED] NICODERM CQ 21 MG/24HR patch Place 21 mg onto the skin daily.   FLUoxetine (PROZAC) 40 MG capsule Take 2 capsules (80 mg total) by mouth daily.   temazepam (RESTORIL) 30 MG capsule Take 30 mg by mouth at bedtime as needed for sleep. (Patient not taking: Reported on 02/11/2023)   [DISCONTINUED] metroNIDAZOLE (FLAGYL) 500 MG tablet Take 1 tablet (500 mg total) by mouth 3 (three) times daily.   [DISCONTINUED] oxyCODONE (OXY IR/ROXICODONE) 5 MG immediate release tablet Take 5 mg by mouth every 6 (six) hours as needed.   No facility-administered encounter medications on file as of 02/11/2023.    Past Medical History:  Diagnosis Date   Chronic abdominal pain    Chronic back pain    Colitis    Constipation    GERD (gastroesophageal reflux disease)    Gram-negative bacteremia 09/03/2022   Hypertension    Migraine    Plumbism    blood clot    PONV (postoperative nausea and vomiting)    Pyelonephritis 09/02/2022   Renal mass    Sepsis due to Escherichia coli (E. coli) (HCC) 09/03/2022   Sepsis due to undetermined organism (HCC) 09/02/2022   Uterine cancer [facility])    age 56    Past Surgical History:  Procedure Laterality Date   ABDOMINAL HYSTERECTOMY     BACK SURGERY     BIOPSY  07/07/2021   Procedure: BIOPSY;  Surgeon: Lanelle Bal, DO;  Location: [facility];  Service: Endoscopy;;    CHOLECYSTECTOMY     Complicated by bile leak requiring ERCP with temporary stenting   COLONOSCOPY WITH ESOPHAGOGASTRODUODENOSCOPY (EGD)  07/2009   [facility]: Op notes cannot be seen through care everywhere however pathology showed terminal ileum and random colon biopsies normal.  Stomach biopsy with gastric antral and fundic mucosa with reactive foveolar hyperplasia.  No active gastritis.  Stains negative for H. pylori.   COLONOSCOPY WITH PROPOFOL N/A 07/07/2021   Surgeon: Earnest Bailey K, DO;  Non-bleeding internal hemorrhoids, normal TI  biopsied, normal colon biopsied. Pathology with colon and TI biopsies normal.   ESOPHAGEAL BRUSHING  07/07/2021   Procedure: ESOPHAGEAL BRUSHING;  Surgeon: Lanelle Bal, DO;  Location: [facility];  Service: Endoscopy;;   ESOPHAGOGASTRODUODENOSCOPY  06/2007   [facility]: Normal esophagus, gastric  mucosal abnormality with erythema, biopsy showed gastric antral and fundic mucosa with reactive foveolar hyperplasia, no gastritis, no H. pylori.   ESOPHAGOGASTRODUODENOSCOPY (EGD) WITH PROPOFOL N/A 07/07/2021   Surgeon: Lanelle Bal, DO; moderately severe Candida esophagitis, gastritis, biopsies negative for H. pylori, normal examined duodenum.  Prescribed Diflucan and recommended PPI twice daily.   HAND SURGERY      Family History  Adopted: Yes  Problem Relation Age of Onset   Colon cancer Maternal Grandfather        42s   Colon cancer Maternal Uncle        54s   Cancer Other    Seizures Other    Stroke Other    Diabetes Other     Social History   Socioeconomic History   Marital status: Married    Spouse name: benito   Number of children: 2   Years of education: Not on file   Highest education level: Associate degree: occupational, Scientist, product/process development, or vocational program  Occupational History    Comment: not employed  Tobacco Use   Smoking status: Every Day    Current packs/day: 1.00    Average packs/day: 1 pack/day for 20.0 years (20.0 ttl  pk-yrs)    Types: Cigarettes, E-cigarettes   Smokeless tobacco: Never  Vaping Use   Vaping status: Never Used  Substance and Sexual Activity   Alcohol use: No   Drug use: No   Sexual activity: Yes    Birth control/protection: Surgical  Other Topics Concern   Not on file  Social History Narrative   Not on file   Social Determinants of Health   Financial Resource Strain: Low Risk  (03/19/2017)   Overall Financial Resource Strain (CARDIA)    Difficulty of Paying Living Expenses: Not very hard  Food Insecurity: No Food Insecurity (03/19/2017)   Hunger Vital Sign    Worried About Running Out of Food in the Last Year: Never true    Ran Out of Food in the Last Year: Never true  Transportation Needs: No Transportation Needs (03/19/2017)   PRAPARE - Administrator, Civil Service (Medical): No    Lack of Transportation (Non-Medical): No  Physical Activity: Inactive (03/19/2017)   Exercise Vital Sign    Days of Exercise per Week: 0 days    Minutes of Exercise per Session: 0 min  Stress: Stress Concern Present (03/19/2017)   [employer] of Occupational Health - Occupational Stress Questionnaire    Feeling of Stress : To some extent  Social Connections: Somewhat Isolated (03/19/2017)   Social Connection and Isolation Panel [NHANES]    Frequency of Communication with Friends and Family: More than three times a week    Frequency of Social Gatherings with Friends and Family: Twice a week    Attends Religious Services: Never    Database administrator or Organizations: No    Attends Banker Meetings: Never    Marital Status: Married  Catering manager Violence: Not At Risk (03/19/2017)   Humiliation, Afraid, Rape, and Kick questionnaire    Fear of Current or Ex-Partner: No    Emotionally Abused: No    Physically Abused: No    Sexually Abused: No    Review of Systems  Constitutional:  Negative for chills and fever.  HENT:  Negative for sore throat.    Respiratory:  Negative for cough and shortness of breath.   Cardiovascular:  Negative for chest pain, palpitations and leg swelling.  Gastrointestinal:  Negative for abdominal  pain, blood in stool, constipation, diarrhea, nausea and vomiting.  Genitourinary:  Negative for dysuria and hematuria.  Musculoskeletal:  Negative for myalgias.  Skin:  Negative for itching and rash.  Neurological:  Negative for dizziness and headaches.  Psychiatric/Behavioral:  Negative for depression and suicidal ideas. The patient has insomnia.         Objective    BP 114/74 (BP Location: Right Arm, Patient Position: Sitting, Cuff Size: Normal)   Pulse 66   Ht 5\' 1"  (1.549 m)   Wt 133 lb 6.4 oz (60.5 kg)   SpO2 97%   BMI 25.21 kg/m   Physical Exam Vitals reviewed.  Constitutional:      General: She is not in acute distress.    Appearance: Normal appearance. She is not toxic-appearing.  HENT:     Head: Normocephalic and atraumatic.     Right Ear: External ear normal.     Left Ear: External ear normal.     Nose: Nose normal. No congestion or rhinorrhea.     Mouth/Throat:     Mouth: Mucous membranes are moist.     Pharynx: Oropharynx is clear. No oropharyngeal exudate or posterior oropharyngeal erythema.  Eyes:     General: No scleral icterus.    Extraocular Movements: Extraocular movements intact.     Conjunctiva/sclera: Conjunctivae normal.     Pupils: Pupils are equal, round, and reactive to light.  Cardiovascular:     Rate and Rhythm: Normal rate and regular rhythm.     Pulses: Normal pulses.     Heart sounds: Normal heart sounds. No murmur heard.    No friction rub. No gallop.  Pulmonary:     Effort: Pulmonary effort is normal.     Breath sounds: Normal breath sounds. No wheezing, rhonchi or rales.  Abdominal:     General: Abdomen is flat. Bowel sounds are normal. There is no distension.     Palpations: Abdomen is soft.     Tenderness: There is no abdominal tenderness.   Musculoskeletal:        General: No swelling. Normal range of motion.     Cervical back: Normal range of motion.     Right lower leg: No edema.     Left lower leg: No edema.  Lymphadenopathy:     Cervical: No cervical adenopathy.  Skin:    General: Skin is warm and dry.     Capillary Refill: Capillary refill takes less than 2 seconds.     Coloration: Skin is not jaundiced.  Neurological:     General: No focal deficit present.     Mental Status: She is alert and oriented to person, place, and time.  Psychiatric:        Mood and Affect: Mood normal.        Behavior: Behavior normal.   Last CBC Lab Results  Component Value Date   WBC 7.7 09/08/2022   HGB 10.5 (L) 09/08/2022   HCT 34.5 (L) 09/08/2022   MCV 85.6 09/08/2022   MCH 26.1 09/08/2022   RDW 19.2 (H) 09/08/2022   PLT 326 09/08/2022   Last metabolic panel Lab Results  Component Value Date   GLUCOSE 112 (H) 09/08/2022   NA 136 09/08/2022   K 3.3 (L) 09/08/2022   CL 97 (L) 09/08/2022   CO2 32 09/08/2022   BUN 6 09/08/2022   CREATININE 0.57 09/08/2022   GFRNONAA >60 09/08/2022   CALCIUM 7.4 (L) 09/08/2022   PROT 6.3 (L) 09/08/2022   ALBUMIN 1.6 (  L) 09/08/2022   BILITOT 0.7 09/08/2022   ALKPHOS 103 09/08/2022   AST 64 (H) 09/08/2022   ALT 39 09/08/2022   ANIONGAP 7 09/08/2022   Last vitamin B12 and Folate Lab Results  Component Value Date   VITAMINB12 426 09/02/2022   FOLATE 15.9 09/02/2022   Assessment & Plan:   Problem List Items Addressed This Visit       Essential hypertension    Adequately controlled on current antihypertensive regimen.  No medication changes are indicated today.      Nonischemic cardiomyopathy (HCC)    EF 25-30% on TTE during hospital admission in April for sepsis secondary to pyelonephritis and E. coli bacteremia.  Started on appropriate GDMT.  EF recovered on repeat TTE in May.  She is euvolemic on exam today.  Followed by cardiology.  Currently prescribed Entresto, Aldactone,  Toprol-XL, and Lasix daily as needed.  No medication changes are indicated today.      COPD (chronic obstructive pulmonary disease) (HCC) (Chronic)    Previously documented history of COPD.  She is currently prescribed Spiriva, albuterol, and DuoNebs.  She will establish care with pulmonology (Dr. Sherene Sires) on 10/10.  Pulmonary exam is unremarkable today.      Nausea and vomiting    History of chronic nausea.  She is followed by gastroenterology and is currently prescribed Phenergan and Zofran for as needed symptom relief.  No changes have been made today.      Gastroesophageal reflux disease    Symptoms are adequately controlled with Protonix 40 mg twice daily.  Followed by GI.  No changes have been made today.      Complex renal cyst    She has recently been referred to urology (Dr. Annabell Howells) for evaluation of complex renal cyst on the upper pole of the left kidney.  Underwent MRI on 9/20, which showed a stable, 3.6 cm Bosniak 29F left upper pole renal cyst.  Repeat MRI recommended for 1 year.      CKD (chronic kidney disease), stage II    Followed by nephrology (Dr. Wolfgang Phoenix).  Repeat labs ordered today.      Chronic pain syndrome    History of chronic pain syndrome attributed to chronic back pain.  Followed by pain management at [facility] and is currently prescribed oxycodone 10 mg every 6 hours as needed for severe pain relief.      Chronic generalized abdominal pain    Followed by gastroenterology.  Exact etiology remains unclear, IBD vs IBS vs recurrent colitis.  Currently prescribed Bentyl for as needed pain relief, which is effective.  No changes have been made today.      Anxiety and depression - Primary    Previously followed by psychiatry.  She is currently prescribed fluoxetine 80 mg daily and hydroxyzine 50 mg as needed for anxiety relief.  Refill of fluoxetine requested today.  She is also interested in establishing care with a new psychiatrist.  Mood is currently stable and  anxiety adequately controlled. -Fluoxetine has been refilled -Psychiatry referral placed today      Hyperlipidemia    Lipid panel updated in April.  Total cholesterol 300 and LDL 199.  Atorvastatin was increased to 40 mg daily as a result. -Repeat lipid panel ordered today      Current tobacco use    Currently smokes 0.5 packs/day and has previously smoked at a higher quantity.  She has been smoking since age 48.  Currently interested in cessation and has tried NicoDerm 21  mg per 24-hour patches but states that they are too strong.  She would like to continue patches at a lower dose. -The patient was counseled on the dangers of tobacco use, and was advised to quit.  Reviewed strategies to maximize success, including removing cigarettes and smoking materials from environment, stress management, substitution of other forms of reinforcement, support of family/friends, and written materials. -NicoDerm patches 14 mg /24 hr prescribed today      Insomnia    Her acute concern today is requesting a refill of temazepam for treatment of insomnia.  She has previously been prescribed temazepam 30 mg nightly as needed for insomnia.  PDMP reviewed.  Last filled in May.  Reports that she does not take it every night, only when needed. -Prescription not provided today.  I have requested that she sign a controlled substance agreement and complete UDS.  Can fill temazepam upon completion and review.  She has been referred to psychiatry today to establish care.      Return in about 3 months (around 05/13/2023).   Billie Lade, MD

## 2023-02-11 NOTE — Assessment & Plan Note (Signed)
Previously documented history of COPD.  She is currently prescribed Spiriva, albuterol, and DuoNebs.  She will establish care with pulmonology (Dr. Sherene Sires) on 10/10.  Pulmonary exam is unremarkable today.

## 2023-02-11 NOTE — Assessment & Plan Note (Signed)
Followed by gastroenterology.  Exact etiology remains unclear, IBD vs IBS vs recurrent colitis.  Currently prescribed Bentyl for as needed pain relief, which is effective.  No changes have been made today.

## 2023-02-11 NOTE — Assessment & Plan Note (Signed)
History of chronic pain syndrome attributed to chronic back pain.  Followed by pain management at [facility] and is currently prescribed oxycodone 10 mg every 6 hours as needed for severe pain relief.

## 2023-02-11 NOTE — Assessment & Plan Note (Signed)
History of chronic nausea.  She is followed by gastroenterology and is currently prescribed Phenergan and Zofran for as needed symptom relief.  No changes have been made today.

## 2023-02-11 NOTE — Assessment & Plan Note (Signed)
Adequately controlled on current antihypertensive regimen.  No medication changes are indicated today.

## 2023-02-11 NOTE — Assessment & Plan Note (Signed)
She has recently been referred to urology (Dr. Annabell Howells) for evaluation of complex renal cyst on the upper pole of the left kidney.  Underwent MRI on 9/20, which showed a stable, 3.6 cm Bosniak 29F left upper pole renal cyst.  Repeat MRI recommended for 1 year.

## 2023-02-12 ENCOUNTER — Other Ambulatory Visit: Payer: Self-pay | Admitting: Internal Medicine

## 2023-02-12 ENCOUNTER — Telehealth: Payer: Self-pay | Admitting: Internal Medicine

## 2023-02-12 LAB — CMP14+EGFR
ALT: 14 [IU]/L (ref 0–32)
AST: 19 [IU]/L (ref 0–40)
Albumin: 4.1 g/dL (ref 3.8–4.9)
Alkaline Phosphatase: 239 [IU]/L — ABNORMAL HIGH (ref 44–121)
BUN/Creatinine Ratio: 8 — ABNORMAL LOW (ref 9–23)
BUN: 7 mg/dL (ref 6–24)
Bilirubin Total: 0.3 mg/dL (ref 0.0–1.2)
CO2: 24 mmol/L (ref 20–29)
Calcium: 9.3 mg/dL (ref 8.7–10.2)
Chloride: 97 mmol/L (ref 96–106)
Creatinine, Ser: 0.86 mg/dL (ref 0.57–1.00)
Globulin, Total: 3.4 g/dL (ref 1.5–4.5)
Glucose: 72 mg/dL (ref 70–99)
Potassium: 4.7 mmol/L (ref 3.5–5.2)
Sodium: 135 mmol/L (ref 134–144)
Total Protein: 7.5 g/dL (ref 6.0–8.5)
eGFR: 81 mL/min/{1.73_m2} (ref >59)

## 2023-02-12 LAB — CBC WITH DIFFERENTIAL/PLATELET
Basophils Absolute: 0.1 10*3/uL (ref 0.0–0.2)
Basos: 1 %
EOS (ABSOLUTE): 0.3 10*3/uL (ref 0.0–0.4)
Eos: 3 %
Hematocrit: 40.1 % (ref 34.0–46.6)
Hemoglobin: 12.9 g/dL (ref 11.1–15.9)
Immature Grans (Abs): 0.1 10*3/uL (ref 0.0–0.1)
Immature Granulocytes: 1 %
Lymphocytes Absolute: 2.6 10*3/uL (ref 0.7–3.1)
Lymphs: 32 %
MCH: 30.8 pg (ref 26.6–33.0)
MCHC: 32.2 g/dL (ref 31.5–35.7)
MCV: 96 fL (ref 79–97)
Monocytes Absolute: 0.7 10*3/uL (ref 0.1–0.9)
Monocytes: 8 %
Neutrophils Absolute: 4.5 10*3/uL (ref 1.4–7.0)
Neutrophils: 55 %
Platelets: 353 10*3/uL (ref 150–450)
RBC: 4.19 x10E6/uL (ref 3.77–5.28)
RDW: 13 % (ref 11.7–15.4)
WBC: 8.2 10*3/uL (ref 3.4–10.8)

## 2023-02-12 LAB — LIPID PANEL
Chol/HDL Ratio: 4.5 {ratio} — ABNORMAL HIGH (ref 0.0–4.4)
Cholesterol, Total: 140 mg/dL (ref 100–199)
HDL: 31 mg/dL — ABNORMAL LOW (ref >39)
LDL Chol Calc (NIH): 73 mg/dL (ref 0–99)
Triglycerides: 214 mg/dL — ABNORMAL HIGH (ref 0–149)
VLDL Cholesterol Cal: 36 mg/dL (ref 5–40)

## 2023-02-12 LAB — B12 AND FOLATE PANEL
Folate: 6.3 ng/mL (ref >3.0)
Vitamin B-12: 681 pg/mL (ref 232–1245)

## 2023-02-12 LAB — HEMOGLOBIN A1C
Est. average glucose Bld gHb Est-mCnc: 123 mg/dL
Hgb A1c MFr Bld: 5.9 % — ABNORMAL HIGH (ref 4.8–5.6)

## 2023-02-12 LAB — TSH+FREE T4
Free T4: 0.9 ng/dL (ref 0.82–1.77)
TSH: 7.63 u[IU]/mL — ABNORMAL HIGH (ref 0.450–4.500)

## 2023-02-12 LAB — HCV INTERPRETATION

## 2023-02-12 LAB — HCV AB W REFLEX TO QUANT PCR: HCV Ab: NONREACTIVE

## 2023-02-12 LAB — VITAMIN D 25 HYDROXY (VIT D DEFICIENCY, FRACTURES): Vit D, 25-Hydroxy: 20.3 ng/mL — ABNORMAL LOW (ref 30.0–100.0)

## 2023-02-12 NOTE — Telephone Encounter (Signed)
Dr Durwin Nora has not reviewed labs

## 2023-02-12 NOTE — Telephone Encounter (Signed)
Patient called in wants a call back to go over lab results

## 2023-02-13 ENCOUNTER — Other Ambulatory Visit: Payer: Self-pay | Admitting: Internal Medicine

## 2023-02-13 DIAGNOSIS — E559 Vitamin D deficiency, unspecified: Secondary | ICD-10-CM

## 2023-02-13 DIAGNOSIS — J449 Chronic obstructive pulmonary disease, unspecified: Secondary | ICD-10-CM | POA: Diagnosis not present

## 2023-02-13 DIAGNOSIS — G4700 Insomnia, unspecified: Secondary | ICD-10-CM

## 2023-02-13 DIAGNOSIS — R748 Abnormal levels of other serum enzymes: Secondary | ICD-10-CM

## 2023-02-13 MED ORDER — VITAMIN D (ERGOCALCIFEROL) 1.25 MG (50000 UNIT) PO CAPS
50000.0000 [IU] | ORAL_CAPSULE | ORAL | 0 refills | Status: AC
Start: 2023-02-13 — End: 2023-05-02

## 2023-02-14 ENCOUNTER — Encounter: Payer: Self-pay | Admitting: Internal Medicine

## 2023-02-14 ENCOUNTER — Telehealth: Payer: Self-pay | Admitting: Internal Medicine

## 2023-02-14 NOTE — Telephone Encounter (Signed)
Patient called confused about two of her medications one was called in and the other other was not. Asking for Call back  today [phone]

## 2023-02-15 LAB — TOXASSURE SELECT 13 (MW), URINE

## 2023-02-15 NOTE — Progress Notes (Signed)
UDS reviewed and is positive for temazepam and oxycodone as expected, however it is also positive for alprazolam and hydrocodone, which are unexpected results.  She report previous prescriptions for temazepam 30 mg nightly for treatment of insomnia and oxycodone 10 mg every 6 hours as needed for severe pain relief in the setting of chronic pain syndrome.  PDMP shows that oxycodone was refilled 9/19 as a 28-day supply.  Temazepam last filled as a 14-day supply on 5/31. One tablet of alprazolam 0.25 mg was filled on 2/14.  Hydrocodone containing cough syrup was filled in December 2023.  With these results in mind, I will not fill prescriptions for temazepam.  She is followed by pain management at [facility], who manages oxycodone.  Please notify the patient.

## 2023-02-18 ENCOUNTER — Other Ambulatory Visit: Payer: Self-pay | Admitting: Internal Medicine

## 2023-02-18 NOTE — Telephone Encounter (Signed)
Patient is calling needing her temazepam (RESTORIL) 30 MG capsule [[id number]] to last until 10/31 says the [facility] is unable to get her in until then. Please advise Thank you

## 2023-02-19 NOTE — Telephone Encounter (Signed)
Brandi sent patient mychart message that stated dr Durwin Nora will not be filling medication

## 2023-02-21 ENCOUNTER — Encounter: Payer: Self-pay | Admitting: Internal Medicine

## 2023-02-21 ENCOUNTER — Ambulatory Visit (HOSPITAL_COMMUNITY)
Admission: RE | Admit: 2023-02-21 | Discharge: 2023-02-21 | Disposition: A | Discharge status: Home / Self Care | Payer: Medicaid Other | Source: Ambulatory Visit | Attending: Internal Medicine | Admitting: Internal Medicine

## 2023-02-21 ENCOUNTER — Ambulatory Visit: Payer: Medicaid Other | Admitting: Internal Medicine

## 2023-02-21 VITALS — BP 105/66 | HR 66 | Ht 61.0 in | Wt 136.5 lb

## 2023-02-21 DIAGNOSIS — F17210 Nicotine dependence, cigarettes, uncomplicated: Secondary | ICD-10-CM

## 2023-02-21 DIAGNOSIS — J449 Chronic obstructive pulmonary disease, unspecified: Secondary | ICD-10-CM | POA: Insufficient documentation

## 2023-02-21 DIAGNOSIS — J9611 Chronic respiratory failure with hypoxia: Secondary | ICD-10-CM | POA: Diagnosis not present

## 2023-02-21 MED ORDER — BREZTRI AEROSPHERE 160-9-4.8 MCG/ACT IN AERO: 2.0000 | INHALATION_SPRAY | Freq: Two times a day (BID) | RESPIRATORY_TRACT | Status: DC

## 2023-02-21 NOTE — Patient Instructions (Addendum)
Plan A = Automatic = Always=    symbicort 80 (same as Breztri x 1 puff)  Take 2 puffs first thing in am and then another 2 puffs about 12 hours later.    Work on inhaler technique:  relax and gently blow all the way out then take a nice smooth full deep breath back in, triggering the inhaler at same time you start breathing in.  Hold breath in for at least  5 seconds if you can. Blow out symbicort  thru nose. Rinse and gargle with water when done.  If mouth or throat bother you at all,  try brushing teeth/gums/tongue with arm and hammer toothpaste/ make a slurry and gargle and spit out.    Plan B = Backup (to supplement plan A, not to replace it) Only use your albuterol inhaler as a rescue medication to be used if you can't catch your breath by resting or doing a relaxed purse lip breathing pattern.  - The less you use it, the better it will work when you need it. - Ok to use the inhaler up to 2 puffs  every 4 hours if you must but call for appointment if use goes up over your usual need - Don't leave home without it !!  (think of it like the spare tire for your car)   Plan C = Crisis (instead of Plan B but only if Plan B stops working) - only use your albuterol- ipatropium nebulizer if you first try Plan B and it fails to help > ok to use the nebulizer up to every 4 hours but if start needing it regularly call for immediate appointment     Also  Ok to try albuterol 15 min before an activity (on alternating days)  that you know would usually make you short of breath and see if it makes any difference and if makes none then don't take albuterol after activity unless you can't catch your breath as this means it's the resting that helps, not the albuterol.     Protonix 40mg   Take 30- 60 min before your first and last meals of the day and Pepcid 20 mg after supper   GERD (REFLUX)  is an extremely common cause of respiratory symptoms just like yours , many times with no obvious heartburn at all.     It can be treated with medication, but also with lifestyle changes including elevation of the head of your bed (ideally with 6 -8inch blocks under the headboard of your bed),  Smoking cessation, avoidance of late meals, excessive alcohol, and avoid fatty foods, chocolate, peppermint, colas, red wine, and acidic juices such as orange juice.  NO MINT OR MENTHOL PRODUCTS SO NO COUGH DROPS  USE SUGARLESS CANDY INSTEAD (Jolley ranchers or Stover's or Life Savers) or even ice chips will also do - the key is to swallow to prevent all throat clearing. NO OIL BASED VITAMINS - use powdered substitutes.  Avoid fish oil when coughing.   Please remember to go to the  x-ray department  @  [facility] for your tests - we will call you with the results when they are available     The key is to stop smoking completely before smoking completely stops you!    Please schedule a follow up office visit in 6 weeks, call sooner if needed with all medications /inhalers/ solutions in hand so we can verify exactly what you are taking. This includes all medications from all doctors and  over the counters

## 2023-02-21 NOTE — Progress Notes (Signed)
[name], female    DOB: 1970-12-05    MRN: [id number]   Brief patient profile:  80  yowm active smoker  referred to pulmonary clinic in [place]  02/21/2023 by Sharlene Dory  for ? Copd - had CB x around 2018 then admitted April 2024 [facility]   Admit date: 09/02/2022 Discharge date: 09/09/2022   Brief Hospitalization Summary: Please see all hospital notes, images, labs for full details of the hospitalization. ADMISSION HPI:  52 year old female with a history of hypertension, chronic abdominal pain, IBS, colitis, constipation, chronic nausea presenting with worsening generalized abdominal pain Last 24 hours prior to admission.  The patient's spouse at the bedside supplements the history.  The patient is a difficult historian.  Notably, the patient's spouse states that the patient has developed chest congestion, coughing, and some dyspnea on exertion over the past week.  She continues to smoke 1 pack/day.  She has not been on any recent antibiotics.  She has had some generalized weakness.  She has had decreased oral intake.  However, in the last 24 hours prior to admission she developed numerous episodes of nausea and vomiting.  There is no hematemesis.  The patient has not had any diarrhea.  There is no hematochezia or melena.  Spouse also relates that the patient has had some symptoms of urinary urgency in the last day to 2 days.  The patient normally takes BC powders 2 times per week for pain.  There is no hematuria or hemoptysis.  There is no hematemesis. Because of her worsening condition, the patient was brought to emergency department for further evaluation and treatment.   The patient has an extensive GI history for her abdominal pain and IBS and colitis.  Notably, in April 2022 the patient was noted to have colonic wall thickening/colitis on CT.  She was  treated empirically for IBD/suspected Crohn's with prednisone and Entocort with follow-up colonoscopy in February 2023 (off all  steroids for several months)   normal other than nonbleeding internal hemorrhoids and normal colon biopsies.   More recently, the patient was seen in the emergency department on 05/27/2022 with worsening abdominal pain, persistent vomiting throughout the day. CT A/P showed diffuse colonic wall thickening extending from distal transverse colon throughout the rectosigmoid junction consistent with inflammatory or infectious colitis. She was given a 5-day course of prednisone. She also had CT angiography 06/05/2022 that showed patent mesenteric vasculature. She was prescribed a 5-day course of Cipro.   She was most recently in the GI clinic on 08/23/2022 for her abdominal pain, IBS, and possible colitis.  It was felt that her chronic abdominal pain was multifactorial including IBS, bowel hypersensitivity syndrome, and functional abdominal pain.  She was instructed to continue dicyclomine and Linzess. Regarding her chronic nausea and vomiting she has had extensive workup withMRI abdomen in 2022 without biliary abnormalities. Gastric emptying study normal in 2023 and EGD February 2023 consistent with Candida esophagitis and gastritis.   In the ED, the patient was afebrile hemodynamically stable with oxygen saturation 90% on room air.  WBC 16.9, hemoglobin 8.4, platelets 222,000.  Sodium 136, potassium 3.0, bicarbonate 25, serum creatinine 0.92.  AST 47, ALT 18, alk phosphatase 187, total bilirubin 0.6.  CTA abdomen was negative for any contrast extravasation.  Did show focal area of swelling and decreased enhancement in the left kidney with mild fat perinephric stranding.  There was bowel thickening and mucosal enhancement in the ascending and proximal transverse colon.  CTA chest was negative  for any aortic dissection.  There is moderate GGO bilateral with nodular consolidation.  The patient was started on ceftriaxone and metronidazole.    Assessment/Plan:   Severe Sepsis due to UTI  -presented with fever  (103.5) and leukocytosis -due to bacteremia, pyelonephritis and PNA -lactic peaked at 5.0 -initially on  ceftriaxone, azithro, metronidazole -follow blood culture--ESBL E coli  -changed to meropenem/ertapenem for a full 10 day course    E. coli bacteremia -Preliminary multiplex PCR suggest ESBL -Discontinue ceftriaxone -continue meropenem -Discontinue metronidazole -ID consult requested and recommendation is for 10 day course of meropenem/ertapenem    Acute respiratory failure with hypoxia -Presented with tachypnea and hypoxia with saturation 90% on room air -Secondary to pneumonia in the setting of COPD -Stable on 2 L nasal cannula>>weaned to RA -Wean oxygen as tolerated for saturation greater 92% -09/04/22 evening--pt had respiratory distress -09/04/22 CXR--personally reviewed--bilateral interstitial and nodular infiltrates -weaned down to 2L prior to discharge   Acute pulmonary edema - TREATED  -09/04/22 evening--respiratory distress -start IV lasix - transitioned to oral lasix to start 09/09/22  -BNP 2180 -Echo - EF 25-30%  -DC on oral lasix 40 mg daily with potassium supplement -pt has appt to follow up with cardiology on 09/24/22   Echo 10/02/22  wnl  with cards opinion =  Her cardiomyopathy was in the setting of her past acute illness (sepsis), NICM.    History of Present Illness  02/21/2023  Pulmonary/ 1st office eval/ [facility] / [place] [facility] on spiriva dpi  Chief Complaint  Patient presents with   Consult  Dyspnea:  only goes out to go to doctor - just walking room to room at home  Cough: p supper sometimes worse / min mucoid  Sleep: bed if flat with 3 pillows and difficult to breath is lies flatter SABA use: duoneb helps at hs and saba x 3-4 has spiriva 02: 4 lpm at hs : 4lpm p exert  Lung cancer screen: referred today  No obvious day to day or daytime pattern/variability or assoc   purulent sputum or mucus plugs or hemoptysis or cp or chest tightness, subjective  wheeze or overt sinus or hb symptoms.    Also denies any obvious fluctuation of symptoms with weather or environmental changes or other aggravating or alleviating factors except as outlined above   No unusual exposure hx or h/o childhood pna/ asthma or knowledge of premature birth.  Current Allergies, Complete Past Medical History, Past Surgical History, Family History, and Social History were reviewed in [employer] record.  ROS  The following are not active complaints unless bolded Hoarseness, sore throat, dysphagia, dental problems, itching, sneezing,  nasal congestion or discharge of excess mucus or purulent secretions, ear ache,   fever, chills, sweats, unintended wt loss or wt gain, classically pleuritic or exertional cp,  orthopnea pnd or arm/hand swelling  or leg swelling, presyncope, palpitations, abdominal pain, anorexia, nausea, vomiting, diarrhea  or change in bowel habits or change in bladder habits, change in stools or change in urine, dysuria, hematuria,  rash, arthralgias, visual complaints, headache, numbness, weakness or ataxia or problems with walking or coordination,  change in mood/anxious or  memory.            Outpatient Medications Prior to Visit  Medication Sig Dispense Refill   albuterol (VENTOLIN HFA) 108 (90 Base) MCG/ACT inhaler Inhale 2 puffs into the lungs every 4 (four) hours as needed for shortness of breath or wheezing.     atorvastatin (LIPITOR)  40 MG tablet Take 1 tablet (40 mg total) by mouth daily. 90 tablet 3   diazepam (VALIUM) 10 MG tablet Take 1-2 tabs 1 hour prior to the MRI. 2 tablet 0   dicyclomine (BENTYL) 20 MG tablet Take 1 tablet (20 mg total) by mouth 4 (four) times daily -  before meals and at bedtime. TAKE ONE TABLET BY MOUTH THREE TIMES DAILY WITH FOOD. Hold for constipation 120 tablet 3   famotidine (PEPCID) 20 MG tablet Take 20 mg by mouth daily. For break through heart burn     FLUoxetine (PROZAC) 40 MG capsule Take 2  capsules (80 mg total) by mouth daily. 180 capsule 1   furosemide (LASIX) 40 MG tablet Take 1 tablet (40 mg total) by mouth daily as needed (swelling). 30 tablet 1   hydrOXYzine (ATARAX/VISTARIL) 50 MG tablet Take 50 mg by mouth every 6 (six) hours as needed for anxiety.     ipratropium-albuterol (DUONEB) 0.5-2.5 (3) MG/3ML SOLN Take 3 mLs by nebulization every 4 (four) hours as needed (SOB, wheezing, coughing). 360 mL 1   metoprolol succinate (TOPROL-XL) 100 MG 24 hr tablet Take 1 tablet (100 mg total) by mouth daily. Take with or immediately following a meal. 30 tablet 3   Nebulizer System All-In-One MISC 1 Device by Does not apply route as directed. 1 each 0   nicotine (NICODERM CQ) 14 mg/24hr patch Place 1 patch (14 mg total) onto the skin daily. 28 patch 0   nitroGLYCERIN (NITROSTAT) 0.4 MG SL tablet Place 1 tablet (0.4 mg total) under the tongue every 5 (five) minutes as needed for chest pain. (Patient taking differently: Place 0.4 mg under the tongue every 5 (five) minutes x 3 doses as needed for chest pain (if no relief after 3rd dose, proceed to ED or call 911).) 15 tablet 2   ondansetron (ZOFRAN-ODT) 8 MG disintegrating tablet Take 1 tablet (8 mg total) by mouth every 8 (eight) hours. 60 tablet 3   Oxycodone HCl 10 MG TABS Take 10 mg by mouth every 8 (eight) hours as needed.     pantoprazole (PROTONIX) 40 MG tablet TAKE (1) TABLET BY MOUTH TWICE A DAY BEFORE MEALS. (BREAKFAST AND SUPPER) 60 tablet 5   potassium chloride SA (KLOR-CON M) 20 MEQ tablet Take 1 tablet (20 mEq total) by mouth daily as needed. 30 tablet 3   promethazine (PHENERGAN) 25 MG tablet TAKE ONE TABLET BY MOUTH EVERY 6 HOURS AS NEEDED FOR NAUSEA OR VOMITING. 30 tablet 3   sacubitril-valsartan (ENTRESTO) 24-26 MG Take 1 tablet by mouth 2 (two) times daily. 60 tablet 6   spironolactone (ALDACTONE) 25 MG tablet Take 0.5 tablets (12.5 mg total) by mouth daily. 45 tablet 2   temazepam (RESTORIL) 30 MG capsule Take 30 mg by mouth  at bedtime as needed for sleep. (Patient not taking: Reported on 02/11/2023)     Vitamin D, Ergocalciferol, (DRISDOL) 1.25 MG (50000 UNIT) CAPS capsule Take 1 capsule (50,000 Units total) by mouth every 7 (seven) days for 12 doses. 12 capsule 0   SPIRIVA HANDIHALER 18 MCG inhalation capsule 1 capsule by inhaling the contents of the capsule using the HandiHaler device Inhalation Once a day for 30 days     No facility-administered medications prior to visit.    Past Medical History:  Diagnosis Date   Chronic abdominal pain    Chronic back pain    Colitis    Constipation    GERD (gastroesophageal reflux disease)    Gram-negative  bacteremia 09/03/2022   Hypertension    Migraine    Plumbism    blood clot    PONV (postoperative nausea and vomiting)    Pyelonephritis 09/02/2022   Renal mass    Sepsis due to Escherichia coli (E. coli) (HCC) 09/03/2022   Sepsis due to undetermined organism (HCC) 09/02/2022   Uterine cancer (HCC)    age 33      Objective:     BP 105/66   Pulse 66   Ht 5\' 1"  (1.549 m)   Wt 136 lb 8 oz (61.9 kg)   SpO2 93% Comment: RA  BMI 25.79 kg/m   SpO2: 93 % (RA)  Wt Readings from Last 3 Encounters:  02/21/23 136 lb 8 oz (61.9 kg)  02/11/23 133 lb 6.4 oz (60.5 kg)  11/30/22 127 lb (57.6 kg)     Amb somber wf nad    HEENT : Oropharynx  clear   Nasal turbinates nl    NECK :  without  apparent JVD/ palpable Nodes/TM    LUNGS: no acc muscle use,  Min barrel  contour chest wall with bilateral  slightly decreased bs s audible wheeze and  without cough on insp or exp maneuvers and min  Hyperresonant  to  percussion bilaterally    CV:  RRR  no s3 or murmur or increase in P2, and no edema   ABD:  soft and nontender with pos end  insp Hoover's  in the supine position.  No bruits or organomegaly appreciated   MS:  slow gait/ ext warm without deformities Or obvious joint restrictions  calf tenderness, cyanosis or clubbing     SKIN: warm and dry without  lesions      NEURO:  alert, approp, nl sensorium with  no motor or cerebellar deficits apparent.        CXR PA and Lateral:   02/21/2023 :    I personally reviewed images and impression is as follows:     Much improved aeration bilaterally.     Assessment   Cigarette smoker Referred 02/21/2023 for LCS program   4-5 min discussion re active cigarette smoking in addition to office E&M  Ask about tobacco use:   ongoing Advise quitting   I took an extended  opportunity with this patient to outline the consequences of continued cigarette use  in airway disorders based on all the data we have from the multiple national lung health studies (perfomed over decades at millions of dollars in cost)  indicating that smoking cessation, not choice of inhalers or pulmonary physicians, is the most important aspect of her  care.   Assess willingness:  Not committed at this point Assist in quit attempt:  Per PCP when ready Arrange follow up:   Follow up per Primary Care planned     COPD GOLD ? / active smoker Active smoker  - 02/21/2023  After extensive coaching inhaler device,  effectiveness =    75% from a baseline of 50% > try breztri sample then change to symbicort 80 1 bid to address cough and stop spiriva   I don't think she really has much copd at all based on hx prior to her acute lung injury from bacteremia sepsis (ARDS- like s the vent) and appears desperately to need pulmonary rehab and perhaps a trial off entresto if her cough does not improve.  She has no baseline pfts but presently is coughing too much to attempt them and for now keep rx simple as  above plus approp saba use stressed:  Re SABA :  I spent extra time with pt today reviewing appropriate use of albuterol for prn use on exertion with the following points: 1) saba is for relief of sob that does not improve by walking a slower pace or resting but rather if the pt does not improve after trying this first. 2) If the pt is  convinced, as many are, that saba helps recover from activity faster then it's easy to tell if this is the case by re-challenging : ie stop, take the inhaler, then p 5 minutes try the exact same activity (intensity of workload) that just caused the symptoms and see if they are substantially diminished or not after saba 3) if there is an activity that reproducibly causes the symptoms, try the saba 15 min before the activity on alternate days   If in fact the saba really does help, then fine to continue to use it prn but advised may need to look closer at the maintenance regimen being used to achieve better control of airways disease with exertion.   Chronic respiratory failure with hypoxia (HCC) Placed on 02 at d/c 09/09/22 from ALI related to bacteremia  - 02/21/2023   Walked on RA  x  1  lap(s) =  approx 150  ft  @ slow pace, stopped due to sob with lowest 02 sats 88%   She will need best fit eval for ambulatory 02 at this point and referral to pulmonary rehab if she'll agree.   In meantime advised no need for 02 at rest but walking > room to room will need to titrate to keep > 90%   F/u in 6 weeks with all meds in hand using a trust but verify approach to confirm accurate Medication  Reconciliation The principal here is that until we are certain that the  patients are doing what we've asked, it makes no sense to ask them to do more.    Each maintenance medication was reviewed in detail including emphasizing most importantly the difference between maintenance and prns and under what circumstances the prns are to be triggered using an action plan format where appropriate.  Total time for H and P, chart review, counseling, reviewing hfa/02/pulse ox  device(s) , directly observing portions of ambulatory 02 saturation study/ and generating customized AVS unique to this office visit / same day charting  > 60 min new pt eval                   [name], MD 02/21/2023

## 2023-02-21 NOTE — Assessment & Plan Note (Addendum)
Referred 02/21/2023 for LCS program   4-5 min discussion re active cigarette smoking in addition to office E&M  Ask about tobacco use:   ongoing Advise quitting   I took an extended  opportunity with this patient to outline the consequences of continued cigarette use  in airway disorders based on all the data we have from the multiple national lung health studies (perfomed over decades at millions of dollars in cost)  indicating that smoking cessation, not choice of inhalers or pulmonary physicians, is the most important aspect of her  care.   Assess willingness:  Not committed at this point Assist in quit attempt:  Per PCP when ready Arrange follow up:   Follow up per Primary Care planned

## 2023-02-22 ENCOUNTER — Telehealth: Payer: Self-pay | Admitting: Internal Medicine

## 2023-02-22 ENCOUNTER — Encounter: Payer: Self-pay | Admitting: Internal Medicine

## 2023-02-22 ENCOUNTER — Other Ambulatory Visit: Payer: Self-pay

## 2023-02-22 DIAGNOSIS — J449 Chronic obstructive pulmonary disease, unspecified: Secondary | ICD-10-CM

## 2023-02-22 DIAGNOSIS — J9611 Chronic respiratory failure with hypoxia: Secondary | ICD-10-CM | POA: Insufficient documentation

## 2023-02-22 NOTE — Telephone Encounter (Signed)
Called and w/ patient informed of her result her x-ray ,pt said that he understood result, want to her order for portable o2 to go to [facility]. I advised pt that order were put in for portable o2 and pulm. Rehab that someone will contact her regarding his .

## 2023-02-22 NOTE — Assessment & Plan Note (Addendum)
Placed on 02 at d/c 09/09/22 from ALI related to bacteremia  - 02/21/2023   Walked on RA  x  1  lap(s) =  approx 150  ft  @ slow pace, stopped due to sob with lowest 02 sats 88%   She will need best fit eval for ambulatory 02 at this point and referral to pulmonary rehab if she'll agree.   In meantime advised no need for 02 at rest but walking > room to room will need to titrate to keep > 90%   F/u in 6 weeks with all meds in hand using a trust but verify approach to confirm accurate Medication  Reconciliation The principal here is that until we are certain that the  patients are doing what we've asked, it makes no sense to ask them to do more.    Each maintenance medication was reviewed in detail including emphasizing most importantly the difference between maintenance and prns and under what circumstances the prns are to be triggered using an action plan format where appropriate.  Total time for H and P, chart review, counseling, reviewing hfa/02/pulse ox  device(s) , directly observing portions of ambulatory 02 saturation study/ and generating customized AVS unique to this office visit / same day charting  > 60 min new pt eval

## 2023-02-22 NOTE — Assessment & Plan Note (Addendum)
Active smoker  - 02/21/2023  After extensive coaching inhaler device,  effectiveness =    75% from a baseline of 50% > try breztri sample then change to symbicort 80 1 bid to address cough and stop spiriva   I don't think she really has much copd at all based on hx prior to her acute lung injury from bacteremia sepsis (ARDS- like s the vent) and appears desperately to need pulmonary rehab and perhaps a trial off entresto if her cough does not improve.  She has no baseline pfts but presently is coughing too much to attempt them and for now keep rx simple as above plus approp saba use stressed:  Re SABA :  I spent extra time with pt today reviewing appropriate use of albuterol for prn use on exertion with the following points: 1) saba is for relief of sob that does not improve by walking a slower pace or resting but rather if the pt does not improve after trying this first. 2) If the pt is convinced, as many are, that saba helps recover from activity faster then it's easy to tell if this is the case by re-challenging : ie stop, take the inhaler, then p 5 minutes try the exact same activity (intensity of workload) that just caused the symptoms and see if they are substantially diminished or not after saba 3) if there is an activity that reproducibly causes the symptoms, try the saba 15 min before the activity on alternate days   If in fact the saba really does help, then fine to continue to use it prn but advised may need to look closer at the maintenance regimen being used to achieve better control of airways disease with exertion.

## 2023-02-22 NOTE — Telephone Encounter (Signed)
Xray shows scarring from previous infection but no active process and what she really needs now is   1) best fit for lighter portable 02 with goal > 90% walking   2) refer to pulmonary rehab if willing or can talk about this on return

## 2023-02-25 ENCOUNTER — Encounter (HOSPITAL_COMMUNITY): Payer: Medicaid Other

## 2023-02-25 ENCOUNTER — Other Ambulatory Visit: Payer: Self-pay | Admitting: Internal Medicine

## 2023-02-25 ENCOUNTER — Other Ambulatory Visit: Payer: Self-pay | Admitting: Gastroenterology

## 2023-02-25 DIAGNOSIS — G8929 Other chronic pain: Secondary | ICD-10-CM

## 2023-02-25 DIAGNOSIS — E559 Vitamin D deficiency, unspecified: Secondary | ICD-10-CM

## 2023-02-25 DIAGNOSIS — Z79891 Long term (current) use of opiate analgesic: Secondary | ICD-10-CM | POA: Diagnosis not present

## 2023-02-25 DIAGNOSIS — R1084 Generalized abdominal pain: Secondary | ICD-10-CM

## 2023-02-26 ENCOUNTER — Encounter (HOSPITAL_COMMUNITY): Payer: Self-pay | Admitting: *Deleted

## 2023-02-26 ENCOUNTER — Encounter (HOSPITAL_COMMUNITY)
Admission: RE | Admit: 2023-02-26 | Discharge: 2023-02-26 | Disposition: A | Discharge status: Home / Self Care | Payer: Medicaid Other | Source: Ambulatory Visit | Attending: Internal Medicine | Admitting: Internal Medicine

## 2023-02-26 DIAGNOSIS — J9611 Chronic respiratory failure with hypoxia: Secondary | ICD-10-CM | POA: Insufficient documentation

## 2023-02-26 NOTE — Progress Notes (Signed)
Completed virtual orientation today.  EP evaluation is scheduled for 03/07/23 at 1330 .  Documentation for diagnosis can be found in [facility] encounter OV 02/21/23.  [name] is a current tobacco user. Intervention for tobacco cessation was provided at the initial medical review. She was asked about readiness to quit and reported she is trying to quit and down to 1/2 ppd . Patient was advised and educated about tobacco cessation using combination therapy, tobacco cessation classes, quit line, and quit smoking apps. Patient demonstrated understanding of this material. Staff will continue to provide encouragement and follow up with the patient throughout the program.

## 2023-03-07 ENCOUNTER — Encounter (HOSPITAL_COMMUNITY): Admission: RE | Admit: 2023-03-07 | Payer: Medicaid Other | Source: Ambulatory Visit

## 2023-03-07 NOTE — Progress Notes (Deleted)
Referring Provider: Smith Robert, MD Primary Care Physician:  Billie Lade, MD Primary GI Physician: Dr. Bonnetta Barry chief complaint on file.   HPI:   [name] is a 52 y.o. female with history of GERD, Candida esophagitis, chronic nausea, chronic constipation, chronic abdominal pain, colitis on CTs previously. She has been empirically treated for IBD/suspected Crohn's with prednisone and Entocort but has also been treated with antibiotics. It is unclear if she truly has IBD, but suspect more IBS. Follow-up colonoscopy February 2023 (off steroids for several months) with normal exam aside from nonbleeding internal hemorrhoids and normal colon biopsies.    Previously admitted in April 2024 after presenting with worsening abdominal pain and found to have sepsis in the setting of bacteremia, pyelonephritis, pneumonia, concern for colitis on CT.  C. difficile and GI pathogen panel were negative.  She was treated empirically with antibiotics.  Recommended outpatient colonoscopy. She was also found to have anemia with hemoglobin 8.4 on admission, down from 13.2 in January 2024, heme-negative, but iron and iron saturation low.  Hemoglobin declined as low as 6.5 with no overt GI bleeding.  She has been taking 1-2 BC powders daily for headaches outpatient.  She received 2 units PRBCs and hemoglobin improved to 12.5, 10.5 at the time of discharge.  We had initially considered EGD indication, but echocardiogram revealed EF of 25-30% with circumferential pericardial effusion.  Recommended against inpatient procedures.  Could consider outpatient once medically improved.   Repeat echo 5/21 with EF improved to 60-65%.    Last seen in office 11/30/2022.  She felt that her colitis was flaring again.  Reported having frequent watery diarrhea, primarily upper abdominal pain for the last 2 days, consistent with her prior flares.  Reported prior to this, she had been doing well with 1-2 formed stools daily with  Bentyl 20 mg twice daily.  Had not really been having any abdominal pain.  Her chronic nausea was well-controlled with Zofran and Phenergan.  She denies any recent antibiotics, travel, medication changes, sick contacts.  Denied NSAIDs.  GERD remains well-controlled. She was taking PPI BID and denied NISAD use (discontinued BC powders).  Unfortunately, due to global epic/computer outage, we were unable to obtain stat CT.  She was started on Cipro and Flagyl empirically for suspected colitis.  Labs and stool studies were ordered and advised patient to have them completed ASAP.  Would need colonoscopy and EGD once acute symptoms resolved for follow-up on colitis and IDA.    Labs/stool studies not completed.    Today:      Past Medical History:  Diagnosis Date   Chronic abdominal pain    Chronic back pain    Colitis    Constipation    GERD (gastroesophageal reflux disease)    Gram-negative bacteremia 09/03/2022   Hypertension    Migraine    Plumbism    blood clot    PONV (postoperative nausea and vomiting)    Pyelonephritis 09/02/2022   Renal mass    Sepsis due to Escherichia coli (E. coli) (HCC) 09/03/2022   Sepsis due to undetermined organism (HCC) 09/02/2022   Uterine cancer [facility])    age 55    Past Surgical History:  Procedure Laterality Date   ABDOMINAL HYSTERECTOMY     BACK SURGERY     BIOPSY  07/07/2021   Procedure: BIOPSY;  Surgeon: Lanelle Bal, DO;  Location: [facility];  Service: Endoscopy;;   CHOLECYSTECTOMY     Complicated  by bile leak requiring ERCP with temporary stenting   COLONOSCOPY WITH ESOPHAGOGASTRODUODENOSCOPY (EGD)  07/2009   [facility]: Op notes cannot be seen through care everywhere however pathology showed terminal ileum and random colon biopsies normal.  Stomach biopsy with gastric antral and fundic mucosa with reactive foveolar hyperplasia.  No active gastritis.  Stains negative for H. pylori.   COLONOSCOPY WITH PROPOFOL N/A 07/07/2021   Surgeon:  Earnest Bailey K, DO;  Non-bleeding internal hemorrhoids, normal TI  biopsied, normal colon biopsied. Pathology with colon and TI biopsies normal.   ESOPHAGEAL BRUSHING  07/07/2021   Procedure: ESOPHAGEAL BRUSHING;  Surgeon: Lanelle Bal, DO;  Location: [facility];  Service: Endoscopy;;   ESOPHAGOGASTRODUODENOSCOPY  06/2007   [facility]: Normal esophagus, gastric mucosal abnormality with erythema, biopsy showed gastric antral and fundic mucosa with reactive foveolar hyperplasia, no gastritis, no H. pylori.   ESOPHAGOGASTRODUODENOSCOPY (EGD) WITH PROPOFOL N/A 07/07/2021   Surgeon: Lanelle Bal, DO; moderately severe Candida esophagitis, gastritis, biopsies negative for H. pylori, normal examined duodenum.  Prescribed Diflucan and recommended PPI twice daily.   HAND SURGERY      Current Outpatient Medications  Medication Sig Dispense Refill   albuterol (VENTOLIN HFA) 108 (90 Base) MCG/ACT inhaler Inhale 2 puffs into the lungs every 4 (four) hours as needed for shortness of breath or wheezing.     atorvastatin (LIPITOR) 40 MG tablet Take 1 tablet (40 mg total) by mouth daily. 90 tablet 3   Budeson-Glycopyrrol-Formoterol (BREZTRI AEROSPHERE) 160-9-4.8 MCG/ACT AERO Inhale 2 puffs into the lungs in the morning and at bedtime. 1 each    diazepam (VALIUM) 10 MG tablet Take 1-2 tabs 1 hour prior to the MRI. 2 tablet 0   dicyclomine (BENTYL) 20 MG tablet TAKE ONE TABLET BY MOUTH FOUR TIMES DAILY BEFORE MEALS AND AT BEDTIME. TAKE ONE TABLET THREE TIMES DAILY WITH FOOD. HOLD FOR CONSTIPATION. 120 tablet 3   famotidine (PEPCID) 20 MG tablet Take 20 mg by mouth daily. For break through heart burn     FLUoxetine (PROZAC) 40 MG capsule Take 2 capsules (80 mg total) by mouth daily. 180 capsule 1   furosemide (LASIX) 40 MG tablet Take 1 tablet (40 mg total) by mouth daily as needed (swelling). 30 tablet 1   hydrOXYzine (ATARAX/VISTARIL) 50 MG tablet Take 50 mg by mouth every 6 (six) hours as needed for  anxiety.     ipratropium-albuterol (DUONEB) 0.5-2.5 (3) MG/3ML SOLN Take 3 mLs by nebulization every 4 (four) hours as needed (SOB, wheezing, coughing). 360 mL 1   metoprolol succinate (TOPROL-XL) 100 MG 24 hr tablet Take 1 tablet (100 mg total) by mouth daily. Take with or immediately following a meal. 30 tablet 3   Nebulizer System All-In-One MISC 1 Device by Does not apply route as directed. 1 each 0   nicotine (NICODERM CQ) 14 mg/24hr patch Place 1 patch (14 mg total) onto the skin daily. 28 patch 0   nitroGLYCERIN (NITROSTAT) 0.4 MG SL tablet Place 1 tablet (0.4 mg total) under the tongue every 5 (five) minutes as needed for chest pain. (Patient taking differently: Place 0.4 mg under the tongue every 5 (five) minutes x 3 doses as needed for chest pain (if no relief after 3rd dose, proceed to ED or call 911).) 15 tablet 2   ondansetron (ZOFRAN-ODT) 8 MG disintegrating tablet Take 1 tablet (8 mg total) by mouth every 8 (eight) hours. 60 tablet 3   Oxycodone HCl 10 MG TABS Take 10  mg by mouth every 8 (eight) hours as needed.     pantoprazole (PROTONIX) 40 MG tablet TAKE (1) TABLET BY MOUTH TWICE A DAY BEFORE MEALS. (BREAKFAST AND SUPPER) 60 tablet 5   potassium chloride SA (KLOR-CON M) 20 MEQ tablet Take 1 tablet (20 mEq total) by mouth daily as needed. 30 tablet 3   promethazine (PHENERGAN) 25 MG tablet TAKE ONE TABLET BY MOUTH EVERY 6 HOURS AS NEEDED FOR NAUSEA OR VOMITING. 30 tablet 3   sacubitril-valsartan (ENTRESTO) 24-26 MG Take 1 tablet by mouth 2 (two) times daily. 60 tablet 6   spironolactone (ALDACTONE) 25 MG tablet Take 0.5 tablets (12.5 mg total) by mouth daily. 45 tablet 2   temazepam (RESTORIL) 30 MG capsule Take 30 mg by mouth at bedtime as needed for sleep.     Vitamin D, Ergocalciferol, (DRISDOL) 1.25 MG (50000 UNIT) CAPS capsule Take 1 capsule (50,000 Units total) by mouth every 7 (seven) days for 12 doses. 12 capsule 0   No current facility-administered medications for this  visit.    Allergies as of 03/08/2023 - Review Complete 02/26/2023  Allergen Reaction Noted   Buprenorphine Other (See Comments) 01/24/2016   Penicillins Swelling and Rash 07/08/2011   Aripiprazole Other (See Comments) 08/01/2020   Seroquel [quetiapine] Other (See Comments) 08/18/2020   Cyclobenzaprine  07/04/2022   Duloxetine hcl  07/04/2022   Cefaclor Other (See Comments)    Doxycycline Rash 08/01/2020   Erythromycin Rash 07/04/2022   Gabapentin Nausea And Vomiting 01/24/2016   Ibuprofen Swelling and Rash 07/08/2011   Levofloxacin Rash 01/24/2016   Lisinopril Nausea And Vomiting 08/01/2020   Macrobid [nitrofurantoin monohyd macro] Rash 11/30/2014   Naproxen Swelling and Rash 07/08/2011   Pregabalin Rash 07/04/2022    Family History  Adopted: Yes  Problem Relation Age of Onset   Colon cancer Maternal Grandfather        59s   Colon cancer Maternal Uncle        29s   Cancer Other    Seizures Other    Stroke Other    Diabetes Other     Social History   Socioeconomic History   Marital status: Married    Spouse name: benito   Number of children: 2   Years of education: Not on file   Highest education level: Associate degree: occupational, Scientist, product/process development, or vocational program  Occupational History    Comment: not employed  Tobacco Use   Smoking status: Every Day    Current packs/day: 1.00    Average packs/day: 1 pack/day for 20.0 years (20.0 ttl pk-yrs)    Types: Cigarettes, E-cigarettes   Smokeless tobacco: Never  Vaping Use   Vaping status: Never Used  Substance and Sexual Activity   Alcohol use: No   Drug use: No   Sexual activity: Yes    Birth control/protection: Surgical  Other Topics Concern   Not on file  Social History Narrative   Not on file   Social Determinants of Health   Financial Resource Strain: Low Risk  (03/19/2017)   Overall Financial Resource Strain (CARDIA)    Difficulty of Paying Living Expenses: Not very hard  Food Insecurity: No Food  Insecurity (03/19/2017)   Hunger Vital Sign    Worried About Running Out of Food in the Last Year: Never true    Ran Out of Food in the Last Year: Never true  Transportation Needs: No Transportation Needs (03/19/2017)   PRAPARE - Administrator, Civil Service (Medical): No  Lack of Transportation (Non-Medical): No  Physical Activity: Inactive (03/19/2017)   Exercise Vital Sign    Days of Exercise per Week: 0 days    Minutes of Exercise per Session: 0 min  Stress: Stress Concern Present (03/19/2017)   [employer] of Occupational Health - Occupational Stress Questionnaire    Feeling of Stress : To some extent  Social Connections: Somewhat Isolated (03/19/2017)   Social Connection and Isolation Panel [NHANES]    Frequency of Communication with Friends and Family: More than three times a week    Frequency of Social Gatherings with Friends and Family: Twice a week    Attends Religious Services: Never    Database administrator or Organizations: No    Attends Engineer, structural: Never    Marital Status: Married    Review of Systems: Gen: Denies fever, chills, anorexia. Denies fatigue, weakness, weight loss.  CV: Denies chest pain, palpitations, syncope, peripheral edema, and claudication. Resp: Denies dyspnea at rest, cough, wheezing, coughing up blood, and pleurisy. GI: Denies vomiting blood, jaundice, and fecal incontinence.   Denies dysphagia or odynophagia. Derm: Denies rash, itching, dry skin Psych: Denies depression, anxiety, memory loss, confusion. No homicidal or suicidal ideation.  Heme: Denies bruising, bleeding, and enlarged lymph nodes.  Physical Exam: There were no vitals taken for this visit. General:   Alert and oriented. No distress noted. Pleasant and cooperative.  Head:  Normocephalic and atraumatic. Eyes:  Conjuctiva clear without scleral icterus. Heart:  S1, S2 present without murmurs appreciated. Lungs:  Clear to auscultation bilaterally.  No wheezes, rales, or rhonchi. No distress.  Abdomen:  +BS, soft, non-tender and non-distended. No rebound or guarding. No HSM or masses noted. Msk:  Symmetrical without gross deformities. Normal posture. Extremities:  Without edema. Neurologic:  Alert and  oriented x4 Psych:  Normal mood and affect.    Assessment:     Plan:  ***   Ermalinda Memos, PA-C [facility] 03/08/2023

## 2023-03-08 ENCOUNTER — Ambulatory Visit: Payer: Medicaid Other | Admitting: Gastroenterology

## 2023-03-12 ENCOUNTER — Telehealth (HOSPITAL_COMMUNITY): Payer: Self-pay

## 2023-03-12 NOTE — Telephone Encounter (Signed)
Spoke with pt confirmed 03/14/23 appt

## 2023-03-14 ENCOUNTER — Ambulatory Visit (HOSPITAL_COMMUNITY): Payer: Medicaid Other | Admitting: Psychiatry

## 2023-03-14 ENCOUNTER — Ambulatory Visit: Payer: Medicaid Other | Admitting: Urology

## 2023-03-14 ENCOUNTER — Encounter (HOSPITAL_COMMUNITY): Payer: Self-pay | Admitting: Psychiatry

## 2023-03-14 DIAGNOSIS — F411 Generalized anxiety disorder: Secondary | ICD-10-CM | POA: Insufficient documentation

## 2023-03-14 DIAGNOSIS — E559 Vitamin D deficiency, unspecified: Secondary | ICD-10-CM | POA: Insufficient documentation

## 2023-03-14 DIAGNOSIS — F333 Major depressive disorder, recurrent, severe with psychotic symptoms: Secondary | ICD-10-CM

## 2023-03-14 DIAGNOSIS — Z789 Other specified health status: Secondary | ICD-10-CM | POA: Insufficient documentation

## 2023-03-14 DIAGNOSIS — F4000 Agoraphobia, unspecified: Secondary | ICD-10-CM | POA: Diagnosis not present

## 2023-03-14 DIAGNOSIS — F5104 Psychophysiologic insomnia: Secondary | ICD-10-CM

## 2023-03-14 DIAGNOSIS — F4310 Post-traumatic stress disorder, unspecified: Secondary | ICD-10-CM | POA: Diagnosis not present

## 2023-03-14 DIAGNOSIS — F32A Depression, unspecified: Secondary | ICD-10-CM

## 2023-03-14 HISTORY — DX: Depression, unspecified: F32.A

## 2023-03-14 NOTE — Patient Instructions (Signed)
We added Remeron (mirtazapine) 15 mg nightly to your regimen today.  Take this about 30 to 45 minutes before you want to be asleep and give yourself about 8 hours to be asleep once you take it.  Do not mix with alcohol or other sedating medications.  I will coordinate with your PCP to see about getting another set of lab work to rule out other causes of how you have been feeling.  In the meantime, please begin cutting back on caffeine starting with the caffeine is closest to bedtime.  Decrease by half a unit (unit is a bottle, can, cup whenever the caffeine is coming in etc.) every 5 days until you are consuming no more than 1 unit of caffeine first thing in the morning.  If you have a caffeine withdrawal headaches you can have sips of the caffeine but do not go back to the old amount.  This should help with your anxiety and insomnia significantly.  Also please reach out to your insurer to find a therapist that is in network.  Keep up the good work with trying to cut back on smoking just be sure to take off the nicotine patches before bed.

## 2023-03-14 NOTE — Progress Notes (Signed)
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 evaluation if the symptoms worsen or if the condition fails to  improve as anticipated.  I provided 60 minutes dedicated to the care of this patient via video on the date of this encounter to include chart review, face-to-face time with the patient, medication management/counseling, coordination of care with primary care provider.  Elsie Lincoln, MD 10/31/20242:22 PM

## 2023-03-16 DIAGNOSIS — J449 Chronic obstructive pulmonary disease, unspecified: Secondary | ICD-10-CM | POA: Diagnosis not present

## 2023-03-18 ENCOUNTER — Other Ambulatory Visit: Payer: Self-pay | Admitting: Gastroenterology

## 2023-03-18 ENCOUNTER — Telehealth (HOSPITAL_COMMUNITY): Payer: Self-pay | Admitting: *Deleted

## 2023-03-18 DIAGNOSIS — R112 Nausea with vomiting, unspecified: Secondary | ICD-10-CM

## 2023-03-18 MED ORDER — MIRTAZAPINE 15 MG PO TABS
15.0000 mg | ORAL_TABLET | Freq: Every day | ORAL | 1 refills | Status: DC
Start: 2023-03-18 — End: 2023-04-09

## 2023-03-18 NOTE — Telephone Encounter (Signed)
Patient called stating she did not get her sleeping medication that provider stated he was going to send in. Per pt she would like to know why it was not sent.

## 2023-03-18 NOTE — Addendum Note (Signed)
Addended by: Tia Masker on: 03/18/2023 03:16 PM   Modules accepted: Orders

## 2023-03-19 NOTE — Telephone Encounter (Signed)
Called patient and informed her with what provider stated and she agreed and verbalized understanding.

## 2023-03-27 ENCOUNTER — Other Ambulatory Visit: Payer: Self-pay | Admitting: Internal Medicine

## 2023-03-27 DIAGNOSIS — Z720 Tobacco use: Secondary | ICD-10-CM

## 2023-03-28 ENCOUNTER — Other Ambulatory Visit: Payer: Self-pay | Admitting: Gastroenterology

## 2023-03-28 ENCOUNTER — Other Ambulatory Visit: Payer: Self-pay | Admitting: Nurse Practitioner

## 2023-03-28 DIAGNOSIS — R112 Nausea with vomiting, unspecified: Secondary | ICD-10-CM

## 2023-03-28 MED ORDER — METOPROLOL SUCCINATE ER 100 MG PO TB24: 100.0000 mg | ORAL_TABLET | Freq: Every day | ORAL | 3 refills | Status: DC

## 2023-04-04 ENCOUNTER — Encounter: Payer: Self-pay | Admitting: Internal Medicine

## 2023-04-04 ENCOUNTER — Telehealth: Payer: Self-pay | Admitting: Internal Medicine

## 2023-04-04 ENCOUNTER — Ambulatory Visit: Payer: Medicaid Other | Admitting: Internal Medicine

## 2023-04-04 VITALS — BP 130/69 | HR 67 | Ht 61.0 in | Wt 137.0 lb

## 2023-04-04 DIAGNOSIS — R0609 Other forms of dyspnea: Secondary | ICD-10-CM | POA: Diagnosis not present

## 2023-04-04 DIAGNOSIS — J449 Chronic obstructive pulmonary disease, unspecified: Secondary | ICD-10-CM

## 2023-04-04 DIAGNOSIS — F17210 Nicotine dependence, cigarettes, uncomplicated: Secondary | ICD-10-CM

## 2023-04-04 DIAGNOSIS — R058 Other specified cough: Secondary | ICD-10-CM

## 2023-04-04 DIAGNOSIS — J9611 Chronic respiratory failure with hypoxia: Secondary | ICD-10-CM

## 2023-04-04 HISTORY — DX: Chronic obstructive pulmonary disease, unspecified: J44.9

## 2023-04-04 MED ORDER — BUDESONIDE-FORMOTEROL FUMARATE 160-4.5 MCG/ACT IN AERO: INHALATION_SPRAY | RESPIRATORY_TRACT | 12 refills | Status: DC

## 2023-04-04 NOTE — Progress Notes (Signed)
[name], female    DOB: [birth year]    MRN: [id number]  Brief patient profile:  42  yowm active smoker  referred to pulmonary clinic in [place]  02/21/2023 by Sharlene Dory  for ? Copd - had CB x around 2018 then admitted April 2024 [facility]   Admit date: 09/02/2022 Discharge date: 09/09/2022   Brief Hospitalization Summary: Please see all hospital notes, images, labs for full details of the hospitalization. ADMISSION HPI:  52 year old female with a history of hypertension, chronic abdominal pain, IBS, colitis, constipation, chronic nausea presenting with worsening generalized abdominal pain Last 24 hours prior to admission.  The patient's spouse at the bedside supplements the history.  The patient is a difficult historian.  Notably, the patient's spouse states that the patient has developed chest congestion, coughing, and some dyspnea on exertion over the past week.  She continues to smoke 1 pack/day.  She has not been on any recent antibiotics.  She has had some generalized weakness.  She has had decreased oral intake.  However, in the last 24 hours prior to admission she developed numerous episodes of nausea and vomiting.  There is no hematemesis.  The patient has not had any diarrhea.  There is no hematochezia or melena.  Spouse also relates that the patient has had some symptoms of urinary urgency in the last day to 2 days.  The patient normally takes BC powders 2 times per week for pain.  There is no hematuria or hemoptysis.  There is no hematemesis. Because of her worsening condition, the patient was brought to emergency department for further evaluation and treatment.   The patient has an extensive GI history for her abdominal pain and IBS and colitis.  Notably, in April 2022 the patient was noted to have colonic wall thickening/colitis on CT.  She was  treated empirically for IBD/suspected Crohn's with prednisone and Entocort with follow-up colonoscopy in February 2023 (off all  steroids for several months)   normal other than nonbleeding internal hemorrhoids and normal colon biopsies.   More recently, the patient was seen in the emergency department on 05/27/2022 with worsening abdominal pain, persistent vomiting throughout the day. CT A/P showed diffuse colonic wall thickening extending from distal transverse colon throughout the rectosigmoid junction consistent with inflammatory or infectious colitis. She was given a 5-day course of prednisone. She also had CT angiography 06/05/2022 that showed patent mesenteric vasculature. She was prescribed a 5-day course of Cipro.   She was most recently in the GI clinic on 08/23/2022 for her abdominal pain, IBS, and possible colitis.  It was felt that her chronic abdominal pain was multifactorial including IBS, bowel hypersensitivity syndrome, and functional abdominal pain.  She was instructed to continue dicyclomine and Linzess. Regarding her chronic nausea and vomiting she has had extensive workup withMRI abdomen in 2022 without biliary abnormalities. Gastric emptying study normal in 2023 and EGD February 2023 consistent with Candida esophagitis and gastritis.   In the ED, the patient was afebrile hemodynamically stable with oxygen saturation 90% on room air.  WBC 16.9, hemoglobin 8.4, platelets 222,000.  Sodium 136, potassium 3.0, bicarbonate 25, serum creatinine 0.92.  AST 47, ALT 18, alk phosphatase 187, total bilirubin 0.6.  CTA abdomen was negative for any contrast extravasation.  Did show focal area of swelling and decreased enhancement in the left kidney with mild fat perinephric stranding.  There was bowel thickening and mucosal enhancement in the ascending and proximal transverse colon.  CTA chest was negative for  any aortic dissection.  There is moderate GGO bilateral with nodular consolidation.  The patient was started on ceftriaxone and metronidazole.    Assessment/Plan:   Severe Sepsis due to UTI  -presented with fever  (103.5) and leukocytosis -due to bacteremia, pyelonephritis and PNA -lactic peaked at 5.0 -initially on  ceftriaxone, azithro, metronidazole -follow blood culture--ESBL E coli  -changed to meropenem/ertapenem for a full 10 day course    E. coli bacteremia -Preliminary multiplex PCR suggest ESBL -Discontinue ceftriaxone -continue meropenem -Discontinue metronidazole -ID consult requested and recommendation is for 10 day course of meropenem/ertapenem    Acute respiratory failure with hypoxia -Presented with tachypnea and hypoxia with saturation 90% on room air -Secondary to pneumonia in the setting of COPD -Stable on 2 L nasal cannula>>weaned to RA -Wean oxygen as tolerated for saturation greater 92% -09/04/22 evening--pt had respiratory distress -09/04/22 CXR--personally reviewed--bilateral interstitial and nodular infiltrates -weaned down to 2L prior to discharge   Acute pulmonary edema - TREATED  -09/04/22 evening--respiratory distress -start IV lasix - transitioned to oral lasix to start 09/09/22  -BNP 2180 -Echo - EF 25-30%  -DC on oral lasix 40 mg daily with potassium supplement -pt has appt to follow up with cardiology on 09/24/22   Echo 10/02/22  wnl  with cards opinion =  Her cardiomyopathy was in the setting of her past acute illness (sepsis), NICM.    History of Present Illness  02/21/2023  Pulmonary/ 1st office eval/ Jaskiran Pata / [place] [facility] on spiriva dpi  Chief Complaint  Patient presents with   Consult  Dyspnea:  only goes out to go to doctor - just walking room to room at home  Cough: p supper sometimes worse / min mucoid  Sleep: bed if flat with 3 pillows and difficult to breath is lies flatter SABA use: duoneb helps at hs and saba x 3-4 has spiriva 02: 4 lpm at hs : 4lpm p exert  Lung cancer screen: referred today Rec Plan A = Automatic = Always=   Symbicort 80 (same as Breztri x 1 puff)  Take 2 puffs first thing in am and then another 2 puffs about 12 hours  later.  Work on inhaler technique:  Plan B = Backup (to supplement plan A, not to replace it) Only use your albuterol inhaler as a rescue medication  Plan C = Crisis (instead of Plan B but only if Plan B stops working) - only use your albuterol- ipatropium nebulizer if you first try Plan B  Also  Ok to try albuterol 15 min before an activity (on alternating days)  that you know would usually make you short of breath Protonix 40mg   Take 30- 60 min before your first and last meals of the day and Pepcid 20 mg after supper GERD diet reviewed, bed blocks rec    The key is to stop smoking completely before smoking completely stops you!   Please schedule a follow up office visit in 6 weeks, call sooner if needed with all medications /inhalers/ solutions in hand     04/04/2023  f/u ov/[place] [facility]/Namita Yearwood re: GOLD ? Copd / AB  maint on Breztri   Chief Complaint  Patient presents with   COPD  Dyspnea:  short of breath across the house without 02  Cough: after supper worse non producive  Sleeping: bed is flat 3 pillows or feel chokin /suffocating    x  years /also overt hb  SABA use: several x per day never pre treats  02:  2lpm at bedtime / same day time  but only at rest, not with activity   Lung cancer screening: not due until 08/2023    No obvious day to day or daytime variability or assoc excess/ purulent sputum or mucus plugs or hemoptysis or cp or chest tightness, subjective wheeze or overt sinus or hb symptoms.    Also denies any obvious fluctuation of symptoms with weather or environmental changes or other aggravating or alleviating factors except as outlined above   No unusual exposure hx or h/o childhood pna/ asthma or knowledge of premature birth.  Current Allergies, Complete Past Medical History, Past Surgical History, Family History, and Social History were reviewed in [employer] record.  ROS  The following are not active complaints unless  bolded Hoarseness, sore throat, dysphagia, dental problems, itching, sneezing,  nasal congestion or discharge of excess mucus or purulent secretions, ear ache,   fever, chills, sweats, unintended wt loss or wt gain, classically pleuritic or exertional cp,  orthopnea pnd or arm/hand swelling  or leg swelling, presyncope, palpitations, abdominal pain, anorexia, nausea, vomiting, diarrhea  or change in bowel habits or change in bladder habits, change in stools or change in urine, dysuria, hematuria,  rash, arthralgias, visual complaints, headache, numbness, weakness or ataxia or problems with walking or coordination,  change in mood/ anxious  or  memory.        Current Meds  Medication Sig   albuterol (VENTOLIN HFA) 108 (90 Base) MCG/ACT inhaler Inhale 2 puffs into the lungs every 4 (four) hours as needed for shortness of breath or wheezing.   Budeson-Glycopyrrol-Formoterol (BREZTRI AEROSPHERE) 160-9-4.8 MCG/ACT AERO Inhale 2 puffs into the lungs in the morning and at bedtime.   buprenorphine-naloxone (SUBOXONE) 2-0.5 mg SUBL SL tablet Place under the tongue.   diazepam (VALIUM) 10 MG tablet Take 1-2 tabs 1 hour prior to the MRI.   dicyclomine (BENTYL) 20 MG tablet TAKE ONE TABLET BY MOUTH FOUR TIMES DAILY BEFORE MEALS AND AT BEDTIME. TAKE ONE TABLET THREE TIMES DAILY WITH FOOD. HOLD FOR CONSTIPATION.   famotidine (PEPCID) 20 MG tablet Take 20 mg by mouth daily. For break through heart burn   FLUoxetine (PROZAC) 40 MG capsule Take 2 capsules (80 mg total) by mouth daily.   furosemide (LASIX) 40 MG tablet Take 1 tablet (40 mg total) by mouth daily as needed (swelling).   hydrOXYzine (ATARAX/VISTARIL) 50 MG tablet Take 50 mg by mouth every 6 (six) hours as needed for anxiety.   ipratropium-albuterol (DUONEB) 0.5-2.5 (3) MG/3ML SOLN Take 3 mLs by nebulization every 4 (four) hours as needed (SOB, wheezing, coughing).   metoprolol succinate (TOPROL-XL) 100 MG 24 hr tablet Take 1 tablet (100 mg total) by  mouth daily. Take with or immediately following a meal.   mirtazapine (REMERON) 15 MG tablet Take 1 tablet (15 mg total) by mouth at bedtime.   Nebulizer System All-In-One MISC 1 Device by Does not apply route as directed.   NICOTINE STEP 2 14 MG/24HR patch PLACE ONE PATCH TOPICALLY ONCE DAILY   nitroGLYCERIN (NITROSTAT) 0.4 MG SL tablet Place 1 tablet (0.4 mg total) under the tongue every 5 (five) minutes as needed for chest pain. (Patient taking differently: Place 0.4 mg under the tongue every 5 (five) minutes x 3 doses as needed for chest pain (if no relief after 3rd dose, proceed to ED or call 911).)   ondansetron (ZOFRAN-ODT) 8 MG disintegrating tablet Take 1 tablet (8 mg total) by mouth every 8 (eight)  hours.   Oxycodone HCl 10 MG TABS Take 10 mg by mouth every 8 (eight) hours as needed.   pantoprazole (PROTONIX) 40 MG tablet TAKE (1) TABLET BY MOUTH TWICE A DAY BEFORE MEALS. (BREAKFAST AND SUPPER)   potassium chloride SA (KLOR-CON M) 20 MEQ tablet Take 1 tablet (20 mEq total) by mouth daily as needed.   promethazine (PHENERGAN) 25 MG tablet TAKE ONE TABLET BY MOUTH EVERY 6 HOURS AS NEEDED FOR NAUSEA OR VOMITING.   sacubitril-valsartan (ENTRESTO) 24-26 MG Take 1 tablet by mouth 2 (two) times daily.   spironolactone (ALDACTONE) 25 MG tablet Take 0.5 tablets (12.5 mg total) by mouth daily.   Vitamin D, Ergocalciferol, (DRISDOL) 1.25 MG (50000 UNIT) CAPS capsule Take 1 capsule (50,000 Units total) by mouth every 7 (seven) days for 12 doses.           Past Medical History:  Diagnosis Date   Chronic abdominal pain    Chronic back pain    Colitis    Constipation    GERD (gastroesophageal reflux disease)    Gram-negative bacteremia 09/03/2022   Hypertension    Migraine    Plumbism    blood clot    PONV (postoperative nausea and vomiting)    Pyelonephritis 09/02/2022   Renal mass    Sepsis due to Escherichia coli (E. coli) (HCC) 09/03/2022   Sepsis due to undetermined organism (HCC)  09/02/2022   Uterine cancer (HCC)    age 6      Objective:    wts  04/04/2023     137   02/21/23 136 lb 8 oz (61.9 kg)  02/11/23 133 lb 6.4 oz (60.5 kg)  11/30/22 127 lb (57.6 kg)    Vital signs reviewed  04/04/2023  - Note at rest 02 sats  96% on RA   General appearance:    somber wf easily confused with details of care    HEENT : Oropharynx  clear       NECK :  without  apparent JVD/ palpable Nodes/TM    LUNGS: no acc muscle use,  Min barrel  contour chest wall with bilateral  slightly decreased bs s audible wheeze and  with cough early on insp maneuvers and min  Hyperresonant  to  percussion bilaterally    CV:  RRR  no s3 or murmur or increase in P2, and no edema   ABD:  soft and nontender with pos end  insp Hoover's  in the supine position.  No bruits or organomegaly appreciated   MS:  Nl gait/ ext warm without deformities Or obvious joint restrictions  calf tenderness, cyanosis or clubbing     SKIN: warm and dry without lesions    NEURO:  alert, approp, nl sensorium with  no motor or cerebellar deficits apparent.            Assessment

## 2023-04-04 NOTE — Telephone Encounter (Signed)
PT just left and states the referral we put in went to the wrong place. It's for Pulmonary Rehab and it was sent to Physical Therapy. Please rectify and call pt to advise action taken.  [phone]

## 2023-04-04 NOTE — Patient Instructions (Addendum)
Take metaprolol 100mg  one half pill twice daily rather than a whole  pill once a day  Plan A = Automatic = Always=    Symbicort 80 Take 2 puffs first thing in am and then another 2 puffs about 12 hours later.    Work on inhaler technique:  relax and gently blow all the way out then take a nice smooth full deep breath back in, triggering the inhaler at same time you start breathing in.  Hold breath in for at least  5 seconds if you can. Blow out symbicort 80  thru nose. Rinse and gargle with water when done.  If mouth or throat bother you at all,  try brushing teeth/gums/tongue with arm and hammer toothpaste/ make a slurry and gargle and spit out.     Plan B = Backup (to supplement plan A, not to replace it) Only use your albuterol inhaler as a rescue medication to be used if you can't catch your breath by resting or doing a relaxed purse lip breathing pattern.  - The less you use it, the better it will work when you need it. - Ok to use the inhaler up to 2 puffs  every 4 hours if you must but call for appointment if use goes up over your usual need - Don't leave home without it !!  (think of it like the spare tire for your car)   Also  Ok to try albuterol 15 min before an activity (on alternating days)  that you know would usually make you short of breath and see if it makes any difference and if makes none then don't take albuterol after activity unless you can't catch your breath as this means it's the resting that helps, not the albuterol.      No need for 02 except at bedtime       My office will be contacting you by phone for referral for PFTs and respiratory therapy   - if you don't hear back from my office within one week please call us back or notify us thru MyChart and we'll address it right away.   Please schedule a follow up visit in 3 months but call sooner if needed  with all medications /inhalers/ solutions in hand so we can verify exactly what you are taking. This includes all  medications from all doctors and over the counters

## 2023-04-04 NOTE — Telephone Encounter (Signed)
Order has been corrected. They will contact patient to schedule.

## 2023-04-05 ENCOUNTER — Encounter: Payer: Self-pay | Admitting: Internal Medicine

## 2023-04-05 ENCOUNTER — Telehealth (HOSPITAL_COMMUNITY): Payer: Self-pay

## 2023-04-05 DIAGNOSIS — R0609 Other forms of dyspnea: Secondary | ICD-10-CM | POA: Insufficient documentation

## 2023-04-05 DIAGNOSIS — R058 Other specified cough: Secondary | ICD-10-CM | POA: Insufficient documentation

## 2023-04-05 NOTE — Assessment & Plan Note (Signed)
Counseled re importance of smoking cessation but did not meet time criteria for separate billing

## 2023-04-05 NOTE — Assessment & Plan Note (Signed)
Active smoker  - 02/21/2023  After extensive coaching inhaler device,  effectiveness =    75% from a baseline of 50% > try breztri sample then change to symbicort 80 1 bid to address cough and stop spiriva > did not change to symbicort as of 04/04/2023 and cough early insp present > try again symb 80 2 bid   I really don't think she has much copd based on today's eval with clear lungs and ex sats so high so will try a lower dose of ICS with less potential irritation of the upper airway

## 2023-04-05 NOTE — Assessment & Plan Note (Addendum)
Onset ? P started entresto ? Spring 2024   Refractory cough on inspiration  is most likely Upper airway cough syndrome (previously labeled PNDS),  is so named because it's frequently impossible to sort out how much is  CR/sinusitis with freq throat clearing (which can be related to primary GERD)   vs  causing  secondary (" extra esophageal")  GERD from wide swings in gastric pressure that occur with throat clearing, often  promoting self use of mint and menthol lozenges that reduce the lower esophageal sphincter tone and exacerbate the problem further in a cyclical fashion.   These are the same pts (now being labeled as having "irritable larynx syndrome" by some cough centers) who not infrequently have a history of having failed to tolerate ace inhibitors (and now entresto due to the The sacubitril component of entresto which not and ACEi but it does lead to higher levels of bradykinin (the culprit in ACEi related cough) because it reduces Neprilysin based clearance of bradykinin. The typical symptoms are dry daytime cough (9% per PI) or complaints of a new sensation of globus or excess PNDS.   dry powder inhalers or biphosphonates or report having atypical/extraesophageal reflux symptoms that don't respond to standard doses of PPI  and are easily confused as having aecopd or asthma flares by even experienced allergists/ pulmonologists (myself included).   Rec avoid high dose ICS? Max rx for gerd and consider 4 week trial off entresto if EF allows and cough persists ( note cards opinion was this was stress related NICM and I doubt she has much chf now)

## 2023-04-05 NOTE — Assessment & Plan Note (Addendum)
Reported doe room to room 04/04/2023 with clear lungs on exam - 04/04/2023   Walked on RA  x  3  lap(s) =  approx 450  ft  @ slow  pace,   with lowest 02 sats 98%  patient c/o fatigue and shob during 2nd lap; patient was very unsteady and wobbly during walk   - 04/04/2023 referred to pulmonary rehab   She is clearly more deconditioned than limited by any cardiopulmonary problem so rec pfts/ referral to pulmonary rehab and f/u 3 m with all meds in hand using a trust but verify approach to confirm accurate Medication  Reconciliation The principal here is that until we are certain that the  patients are doing what we've asked, it makes no sense to ask them to do more.    Each maintenance medication was reviewed in detail including emphasizing most importantly the difference between maintenance and prns and under what circumstances the prns are to be triggered using an action plan format where appropriate.  Total time for H and P, chart review, counseling, reviewing hfa/02 device(s) , directly observing portions of ambulatory 02 saturation study/ and generating customized AVS unique to this office visit / same day charting > 30 min for multiple  refractory respiratory  symptoms of uncertain etiology

## 2023-04-05 NOTE — Telephone Encounter (Signed)
Created in error

## 2023-04-08 DIAGNOSIS — Z79891 Long term (current) use of opiate analgesic: Secondary | ICD-10-CM | POA: Diagnosis not present

## 2023-04-09 ENCOUNTER — Other Ambulatory Visit: Payer: Self-pay | Admitting: Internal Medicine

## 2023-04-09 ENCOUNTER — Encounter (HOSPITAL_COMMUNITY): Payer: Self-pay | Admitting: Psychiatry

## 2023-04-09 ENCOUNTER — Telehealth (HOSPITAL_COMMUNITY): Payer: Medicaid Other | Admitting: Psychiatry

## 2023-04-09 DIAGNOSIS — F333 Major depressive disorder, recurrent, severe with psychotic symptoms: Secondary | ICD-10-CM

## 2023-04-09 DIAGNOSIS — F4000 Agoraphobia, unspecified: Secondary | ICD-10-CM | POA: Diagnosis not present

## 2023-04-09 DIAGNOSIS — Z720 Tobacco use: Secondary | ICD-10-CM

## 2023-04-09 DIAGNOSIS — F1110 Opioid abuse, uncomplicated: Secondary | ICD-10-CM

## 2023-04-09 DIAGNOSIS — F5104 Psychophysiologic insomnia: Secondary | ICD-10-CM

## 2023-04-09 DIAGNOSIS — F1390 Sedative, hypnotic, or anxiolytic use, unspecified, uncomplicated: Secondary | ICD-10-CM

## 2023-04-09 DIAGNOSIS — F411 Generalized anxiety disorder: Secondary | ICD-10-CM

## 2023-04-09 DIAGNOSIS — F4310 Post-traumatic stress disorder, unspecified: Secondary | ICD-10-CM | POA: Diagnosis not present

## 2023-04-09 DIAGNOSIS — Z79891 Long term (current) use of opiate analgesic: Secondary | ICD-10-CM

## 2023-04-09 DIAGNOSIS — Z789 Other specified health status: Secondary | ICD-10-CM

## 2023-04-09 DIAGNOSIS — F17210 Nicotine dependence, cigarettes, uncomplicated: Secondary | ICD-10-CM

## 2023-04-09 DIAGNOSIS — G894 Chronic pain syndrome: Secondary | ICD-10-CM

## 2023-04-09 DIAGNOSIS — F1590 Other stimulant use, unspecified, uncomplicated: Secondary | ICD-10-CM

## 2023-04-09 HISTORY — DX: Other stimulant use, unspecified, uncomplicated: F15.90

## 2023-04-09 MED ORDER — MIRTAZAPINE 30 MG PO TABS
30.0000 mg | ORAL_TABLET | Freq: Every day | ORAL | 0 refills | Status: DC
Start: 2023-04-09 — End: 2023-06-14

## 2023-04-09 NOTE — Patient Instructions (Addendum)
We increased the mirtazapine to 30 mg nightly today.  This should help with the insomnia and anxiety and appetite but please continue to make changes to the caffeine intake and try not to have any caffeine after 12 PM (this includes the tea).  Please get in touch with [facility] services in [place] and they will be better equipped to provide your psychiatric care moving forward.  I will contact Dr. Durwin Nora to see if you need a referral.  You can find their website here: [email or web address]
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
## 2023-04-15 DIAGNOSIS — J449 Chronic obstructive pulmonary disease, unspecified: Secondary | ICD-10-CM | POA: Diagnosis not present

## 2023-04-26 ENCOUNTER — Other Ambulatory Visit: Payer: Self-pay | Admitting: Gastroenterology

## 2023-04-26 DIAGNOSIS — R112 Nausea with vomiting, unspecified: Secondary | ICD-10-CM

## 2023-05-03 ENCOUNTER — Ambulatory Visit: Payer: Medicaid Other | Admitting: Nurse Practitioner

## 2023-05-03 ENCOUNTER — Encounter: Payer: Self-pay | Admitting: Nurse Practitioner

## 2023-05-10 ENCOUNTER — Ambulatory Visit: Payer: Medicaid Other | Admitting: Internal Medicine

## 2023-05-13 ENCOUNTER — Other Ambulatory Visit: Payer: Self-pay | Admitting: Nurse Practitioner

## 2023-05-16 ENCOUNTER — Other Ambulatory Visit: Payer: Self-pay | Admitting: Gastroenterology

## 2023-05-16 DIAGNOSIS — R1084 Generalized abdominal pain: Secondary | ICD-10-CM

## 2023-05-16 DIAGNOSIS — G8929 Other chronic pain: Secondary | ICD-10-CM

## 2023-05-16 DIAGNOSIS — J449 Chronic obstructive pulmonary disease, unspecified: Secondary | ICD-10-CM | POA: Diagnosis not present

## 2023-05-20 DIAGNOSIS — Z79891 Long term (current) use of opiate analgesic: Secondary | ICD-10-CM | POA: Diagnosis not present

## 2023-05-21 ENCOUNTER — Encounter: Payer: Self-pay | Admitting: Internal Medicine

## 2023-05-23 ENCOUNTER — Ambulatory Visit (HOSPITAL_COMMUNITY): Admission: RE | Admit: 2023-05-23 | Payer: Medicaid Other | Source: Ambulatory Visit

## 2023-05-30 ENCOUNTER — Other Ambulatory Visit: Payer: Self-pay | Admitting: Gastroenterology

## 2023-05-30 DIAGNOSIS — R112 Nausea with vomiting, unspecified: Secondary | ICD-10-CM

## 2023-06-04 ENCOUNTER — Ambulatory Visit: Payer: Medicaid Other | Admitting: Nurse Practitioner

## 2023-06-04 NOTE — Progress Notes (Deleted)
  Cardiology Office Note:  .   Date:  06/04/2023  ID:  [name], DOB 1970-11-09, MRN [id number] PCP: Monica Lade, MD  [facility] Providers Cardiologist:  Monica Bicker, MD    History of Present Illness: [name] is a 53 y.o. female with a PMH of HFrecEF, HTN, IBS, chronic abdominal pain/nausea, GERD, colitis, and tobacco abuse, COPD, who presents today for HFrEF follow-up.   Last saw her on Sep 24, 2022. Doing well from a cardiac perspective. Completed IV ABX x 3 days through PICC line after returning home for recent hospital admission for severe sepsis d/t UTI, E. coli bacteremia, pyelonephritis, colonic wall thickening/ colitis, and lobar pneumonia. Updated Echo revealed recovered EF 10/03/2022.   Today she presents for follow-up. She states she is doing well. Did have a mechanical fall around 2 nights ago as she fell off a stepstool and oxygen tank landed on her right foot, wonders if she broke some toes. Had recent labs performed with her PCP's office. Denies any chest pain, shortness of breath, palpitations, syncope, presyncope, dizziness, orthopnea, PND, swelling or significant weight changes, acute bleeding, or claudication.  Studies Reviewed: .    Limited Echo 09/2022:   1. Left ventricular ejection fraction, by estimation, is 60 to 65%. The  left ventricle has normal function. The left ventricle has no regional  wall motion abnormalities. Left ventricular diastolic parameters are  indeterminate. The average left  ventricular global longitudinal strain is -21.4 %. The global longitudinal  strain is normal.   2. Right ventricular systolic function is normal. The right ventricular  size is normal.   3. Limited echo evaluate LV function   Comparison(s): Echocardiogram done 09/05/22 showed an EF of 25-30%.     Physical Exam:   VS:  There were no vitals taken for this visit.   Wt Readings from Last 3 Encounters:  04/04/23 137 lb (62.1 kg)  02/21/23  136 lb 8 oz (61.9 kg)  02/11/23 133 lb 6.4 oz (60.5 kg)    GEN: Well nourished, well developed in no acute distress NECK: No JVD; No carotid bruits CARDIAC: S1/S2, RRR, no murmurs, rubs, gallops RESPIRATORY:  Clear to auscultation without rales, wheezing or rhonchi  ABDOMEN: Soft, non-tender, non-distended EXTREMITIES:  Nonpitting edema to right lateral toes, some minor bruising; No deformity   ASSESSMENT AND PLAN: .    1. HFrecEF, nonischemic cardiomyopathy Stage C, class I-II symptoms. EF recovered to 60-65%. Euvolemic and well compensated on exam. Continue current GDMT. Not a candidate for SGLT2i d/t hx of sepsis. Low sodium diet, fluid restriction <2L, and daily weights encouraged. Educated to contact our office for weight gain of 2 lbs overnight or 5 lbs in one week. Will provider refills per her request and request recent labs with PCP.  Heart healthy diet and regular cardiovascular exercise encouraged.   2. HTN BP stable. Continue current medication regimen. Discussed to monitor BP at home at least 2 hours after medications and sitting for 5-10 minutes. Heart healthy diet and regular cardiovascular exercise encouraged.    Tobacco abuse Smoking cessation encouraged and discussed.    Complex renal cyst Follows Dr. Wolfgang Phoenix. Continue to follow-up with Nephrology.  5. Pedal edema, foot injury Recommend follow-up with PCP and for PCP to arrange imaging.    Dispo: Follow-up with Dr. Jenene Slicker or [facility] in 6 months or sooner if anything changes.   Signed, Monica Dory, NP

## 2023-06-06 ENCOUNTER — Ambulatory Visit: Payer: Medicaid Other | Admitting: Urology

## 2023-06-06 ENCOUNTER — Other Ambulatory Visit: Payer: Self-pay | Admitting: Gastroenterology

## 2023-06-06 DIAGNOSIS — R112 Nausea with vomiting, unspecified: Secondary | ICD-10-CM

## 2023-06-06 NOTE — Progress Notes (Deleted)
Subjective: 1. Cyst of kidney, acquired      Consult requested by Dr. Celso Amy.  1/23/25[name] returns today in f/u.  She has a Bosniak 80F cyst of the LUP that was last imaged with MRI on 02/03/23.  I was stable at 3.6cm and a repeat MRI in a year was recommended.   [name] is a 53 yo female who is sent for a Bosniak 80F cyst of the left upper pole that was last imaged in 4/24 with CT.  She has had prior MRI's and the cyst has been stable since at least 2020 but has enlarged since 2014.  She has some pain intermittently in the left  flank for a few years.  She has had no recent hematuria.  She had left pyelonephritis with e. Coli but a.  treated in April.  Her Cr was 0.57 in 4/24.  She also had sepsis and pneumonia when she was admitted in April.  She has CHF and is on a diuretic so she has frequency.  Her UA has >30 WBC, many bacteria and nit +.  She has no dysuria.  She has Crohn's disease with recurrent colitis but no fecaluria or pneumoturia.  He has an abdominal MRI ordered by Dr. Wolfgang Phoenix.  ROS:  Review of Systems  Cardiovascular:  Positive for chest pain and leg swelling.  Gastrointestinal:  Positive for constipation, diarrhea, heartburn and vomiting.  Musculoskeletal:  Positive for back pain and joint pain.  Neurological:  Positive for weakness.  Psychiatric/Behavioral:  Positive for depression.     Allergies  Allergen Reactions   Buprenorphine Other (See Comments)    Chest pain/ringing in ears and feet swelling   Penicillins Swelling and Rash        Aripiprazole Other (See Comments)    seizure   Seroquel [Quetiapine] Other (See Comments)    Seizure (08/18/20)   Cyclobenzaprine     Other Reaction(s): dizziness   Duloxetine Hcl     Other Reaction(s): Other (see comments)   Cefaclor Other (See Comments)    Muscles locked up   Doxycycline Rash   Erythromycin Rash   Gabapentin Nausea And Vomiting    dizzy   Ibuprofen Swelling and Rash   Levofloxacin Rash    Itching    Lisinopril Nausea And Vomiting   Macrobid [Nitrofurantoin Monohyd Macro] Rash   Naproxen Swelling and Rash   Pregabalin Rash    Past Medical History:  Diagnosis Date   Chronic abdominal pain    Chronic back pain    Colitis    Constipation    GERD (gastroesophageal reflux disease)    Gram-negative bacteremia 09/03/2022   Hypertension    Migraine    Plumbism    blood clot    PONV (postoperative nausea and vomiting)    Pyelonephritis 09/02/2022   Renal mass    Sepsis due to Escherichia coli (E. coli) (HCC) 09/03/2022   Sepsis due to undetermined organism (HCC) 09/02/2022   Uterine cancer [facility])    age 10    Past Surgical History:  Procedure Laterality Date   ABDOMINAL HYSTERECTOMY     BACK SURGERY     BIOPSY  07/07/2021   Procedure: BIOPSY;  Surgeon: Lanelle Bal, DO;  Location: [facility];  Service: Endoscopy;;   CHOLECYSTECTOMY     Complicated by bile leak requiring ERCP with temporary stenting   COLONOSCOPY WITH ESOPHAGOGASTRODUODENOSCOPY (EGD)  07/2009   [facility]: Op notes cannot be seen through care everywhere however pathology showed  terminal ileum and random colon biopsies normal.  Stomach biopsy with gastric antral and fundic mucosa with reactive foveolar hyperplasia.  No active gastritis.  Stains negative for H. pylori.   COLONOSCOPY WITH PROPOFOL N/A 07/07/2021   Surgeon: Earnest Bailey K, DO;  Non-bleeding internal hemorrhoids, normal TI  biopsied, normal colon biopsied. Pathology with colon and TI biopsies normal.   ESOPHAGEAL BRUSHING  07/07/2021   Procedure: ESOPHAGEAL BRUSHING;  Surgeon: Lanelle Bal, DO;  Location: [facility];  Service: Endoscopy;;   ESOPHAGOGASTRODUODENOSCOPY  06/2007   [facility]: Normal esophagus, gastric mucosal abnormality with erythema, biopsy showed gastric antral and fundic mucosa with reactive foveolar hyperplasia, no gastritis, no H. pylori.   ESOPHAGOGASTRODUODENOSCOPY (EGD) WITH PROPOFOL N/A 07/07/2021   Surgeon:  Lanelle Bal, DO; moderately severe Candida esophagitis, gastritis, biopsies negative for H. pylori, normal examined duodenum.  Prescribed Diflucan and recommended PPI twice daily.   HAND SURGERY      Social History   Socioeconomic History   Marital status: Married    Spouse name: benito   Number of children: 2   Years of education: Not on file   Highest education level: Associate degree: academic program  Occupational History    Comment: not employed  Tobacco Use   Smoking status: Every Day    Current packs/day: 1.00    Average packs/day: 1 pack/day for 20.0 years (20.0 ttl pk-yrs)    Types: Cigarettes, E-cigarettes   Smokeless tobacco: Never   Tobacco comments:    Half a pack per day and utilizing nicotine patches as of 03/14/2023  Vaping Use   Vaping status: Never Used  Substance and Sexual Activity   Alcohol use: Not Currently    Comment: Quit in 2021.  See psychiatry note from 03/14/2023   Drug use: Yes    Types: Benzodiazepines, Hydrocodone    Comment: See psychiatry note from 04/09/23   Sexual activity: Yes    Birth control/protection: Surgical  Other Topics Concern   Not on file  Social History Narrative   Not on file   Social Drivers of Health   Financial Resource Strain: High Risk (05/06/2023)   Overall Financial Resource Strain (CARDIA)    Difficulty of Paying Living Expenses: Very hard  Food Insecurity: Food Insecurity Present (05/06/2023)   Hunger Vital Sign    Worried About Running Out of Food in the Last Year: Often true    Ran Out of Food in the Last Year: Sometimes true  Transportation Needs: No Transportation Needs (05/06/2023)   PRAPARE - Administrator, Civil Service (Medical): No    Lack of Transportation (Non-Medical): No  Physical Activity: Unknown (05/06/2023)   Exercise Vital Sign    Days of Exercise per Week: 0 days    Minutes of Exercise per Session: Not on file  Stress: Stress Concern Present (05/06/2023)   [employer] of Occupational [employer] - Occupational Stress Questionnaire    Feeling of Stress : Very much  Social Connections: Unknown (05/06/2023)   Social Connection and Isolation Panel [NHANES]    Frequency of Communication with Friends and Family: Patient declined    Frequency of Social Gatherings with Friends and Family: Never    Attends Religious Services: Never    Database administrator or Organizations: No    Attends Banker Meetings: Not on file    Marital Status: Married  Intimate Partner Violence: Not At Risk (03/19/2017)   Humiliation, Afraid, Rape, and Kick questionnaire  Fear of Current or Ex-Partner: No    Emotionally Abused: No    Physically Abused: No    Sexually Abused: No    Family History  Adopted: Yes  Problem Relation Age of Onset   Colon cancer Maternal Grandfather        32s   Colon cancer Maternal Uncle        58s   Cancer Other    Seizures Other    Stroke Other    Diabetes Other     Anti-infectives: Anti-infectives (From admission, onward)    None       Current Outpatient Medications  Medication Sig Dispense Refill   albuterol (VENTOLIN HFA) 108 (90 Base) MCG/ACT inhaler Inhale 2 puffs into the lungs every 4 (four) hours as needed for shortness of breath or wheezing.     atorvastatin (LIPITOR) 40 MG tablet Take 1 tablet (40 mg total) by mouth daily. 90 tablet 3   buprenorphine-naloxone (SUBOXONE) 2-0.5 mg SUBL SL tablet Place under the tongue.     diazepam (VALIUM) 10 MG tablet Take 1-2 tabs 1 hour prior to the MRI. 2 tablet 0   dicyclomine (BENTYL) 20 MG tablet TAKE ONE TABLET BY MOUTH FOUR TIMES DAILY BEFORE MEALS AND AT BEDTIME. TAKE ONE TABLET THREE TIMES DAILY WITH FOOD. HOLD FOR CONSTIPATION. 120 tablet 3   famotidine (PEPCID) 20 MG tablet Take 20 mg by mouth daily. For break through heart burn     FLUoxetine (PROZAC) 40 MG capsule Take 2 capsules (80 mg total) by mouth daily. 180 capsule 1   furosemide (LASIX) 40 MG tablet  Take 1 tablet (40 mg total) by mouth daily as needed (swelling). 30 tablet 1   hydrOXYzine (ATARAX/VISTARIL) 50 MG tablet Take 50 mg by mouth every 6 (six) hours as needed for anxiety.     ipratropium-albuterol (DUONEB) 0.5-2.5 (3) MG/3ML SOLN Take 3 mLs by nebulization every 4 (four) hours as needed (SOB, wheezing, coughing). 360 mL 1   metoprolol succinate (TOPROL-XL) 100 MG 24 hr tablet Take 1 tablet (100 mg total) by mouth daily. Take with or immediately following a meal. 30 tablet 3   mirtazapine (REMERON) 30 MG tablet Take 1 tablet (30 mg total) by mouth at bedtime. 30 tablet 0   Nebulizer System All-In-One MISC 1 Device by Does not apply route as directed. 1 each 0   NICOTINE STEP 2 14 MG/24HR patch PLACE ONE PATCH TOPICALLY ONCE DAILY 28 patch 0   nitroGLYCERIN (NITROSTAT) 0.4 MG SL tablet Place 1 tablet (0.4 mg total) under the tongue every 5 (five) minutes as needed for chest pain. (Patient taking differently: Place 0.4 mg under the tongue every 5 (five) minutes x 3 doses as needed for chest pain (if no relief after 3rd dose, proceed to ED or call 911).) 15 tablet 2   ondansetron (ZOFRAN-ODT) 8 MG disintegrating tablet TAKE ONE TABLET BY MOUTH EVERY 8 HOURS 60 tablet 3   pantoprazole (PROTONIX) 40 MG tablet TAKE (1) TABLET BY MOUTH TWICE A DAY BEFORE MEALS. (BREAKFAST AND SUPPER) 60 tablet 5   potassium chloride SA (KLOR-CON M) 20 MEQ tablet Take 1 tablet (20 mEq total) by mouth daily as needed. 30 tablet 3   promethazine (PHENERGAN) 25 MG tablet TAKE ONE TABLET BY MOUTH EVERY 6 HOURS AS NEEDED FOR NAUSEA OR VOMITING. 30 tablet 3   sacubitril-valsartan (ENTRESTO) 24-26 MG TAKE ONE TABLET BY MOUTH TWICE DAILY 60 tablet 2   spironolactone (ALDACTONE) 25 MG tablet Take 0.5 tablets (  12.5 mg total) by mouth daily. 45 tablet 2   SYMBICORT 160-4.5 MCG/ACT inhaler Inhale 2 puffs into the lungs.     No current facility-administered medications for this visit.     Objective: Vital signs in last  24 hours: There were no vitals taken for this visit.  Intake/Output from previous day: No intake/output data recorded. Intake/Output this shift: @IOTHISSHIFT @   Physical Exam Vitals reviewed.  Constitutional:      Appearance: Normal appearance.  Cardiovascular:     Rate and Rhythm: Normal rate and regular rhythm.     Heart sounds: Normal heart sounds.  Pulmonary:     Effort: Pulmonary effort is normal.     Breath sounds: Normal breath sounds.  Abdominal:     General: Abdomen is flat.     Palpations: Abdomen is soft.     Tenderness: There is abdominal tenderness (diffuse, left > right.).  Neurological:     Mental Status: She is alert.     Lab Results:  No results found for this or any previous visit (from the past 24 hours).   BMET No results for input(s): "NA", "K", "CL", "CO2", "GLUCOSE", "BUN", "CREATININE", "CALCIUM" in the last 72 hours. PT/INR No results for input(s): "LABPROT", "INR" in the last 72 hours. ABG No results for input(s): "PHART", "HCO3" in the last 72 hours.  Invalid input(s): "PCO2", "PO2" I have reviewed her recent labs and cultures.  Studies/Results: No results found. I have reviewed CT's and MRI's back to 2014.   Assessment/Plan: Complex left upper pole cyst.  I will get an MRI and have her return with the results.  UTI.  I will get a culture and treat accordingly.   No orders of the defined types were placed in this encounter.    No orders of the defined types were placed in this encounter.    No follow-ups on file.    CC: Dr. Celso Amy     Bjorn Pippin 06/06/2023

## 2023-06-14 ENCOUNTER — Ambulatory Visit: Payer: Medicaid Other | Admitting: Internal Medicine

## 2023-06-14 ENCOUNTER — Encounter: Payer: Self-pay | Admitting: Internal Medicine

## 2023-06-14 VITALS — BP 133/65 | HR 83 | Ht 61.0 in | Wt 150.4 lb

## 2023-06-14 DIAGNOSIS — R7989 Other specified abnormal findings of blood chemistry: Secondary | ICD-10-CM | POA: Diagnosis not present

## 2023-06-14 DIAGNOSIS — F333 Major depressive disorder, recurrent, severe with psychotic symptoms: Secondary | ICD-10-CM

## 2023-06-14 DIAGNOSIS — I10 Essential (primary) hypertension: Secondary | ICD-10-CM

## 2023-06-14 DIAGNOSIS — I428 Other cardiomyopathies: Secondary | ICD-10-CM

## 2023-06-14 DIAGNOSIS — F5104 Psychophysiologic insomnia: Secondary | ICD-10-CM

## 2023-06-14 DIAGNOSIS — F4000 Agoraphobia, unspecified: Secondary | ICD-10-CM | POA: Diagnosis not present

## 2023-06-14 DIAGNOSIS — R748 Abnormal levels of other serum enzymes: Secondary | ICD-10-CM | POA: Insufficient documentation

## 2023-06-14 DIAGNOSIS — F411 Generalized anxiety disorder: Secondary | ICD-10-CM | POA: Diagnosis not present

## 2023-06-14 DIAGNOSIS — K219 Gastro-esophageal reflux disease without esophagitis: Secondary | ICD-10-CM

## 2023-06-14 DIAGNOSIS — F4310 Post-traumatic stress disorder, unspecified: Secondary | ICD-10-CM

## 2023-06-14 DIAGNOSIS — E559 Vitamin D deficiency, unspecified: Secondary | ICD-10-CM | POA: Diagnosis not present

## 2023-06-14 DIAGNOSIS — Z720 Tobacco use: Secondary | ICD-10-CM

## 2023-06-14 DIAGNOSIS — F419 Anxiety disorder, unspecified: Secondary | ICD-10-CM | POA: Diagnosis not present

## 2023-06-14 DIAGNOSIS — F32A Depression, unspecified: Secondary | ICD-10-CM

## 2023-06-14 HISTORY — DX: Other cardiomyopathies: I42.8

## 2023-06-14 MED ORDER — MIRTAZAPINE 30 MG PO TABS
30.0000 mg | ORAL_TABLET | Freq: Every day | ORAL | 0 refills | Status: DC
Start: 2023-06-14 — End: 2023-07-05

## 2023-06-14 MED ORDER — FLUOXETINE HCL 40 MG PO CAPS: 80.0000 mg | ORAL_CAPSULE | Freq: Every day | ORAL | 1 refills | Status: DC

## 2023-06-14 MED ORDER — NICOTINE 14 MG/24HR TD PT24: 14.0000 mg | MEDICATED_PATCH | Freq: Every day | TRANSDERMAL | 1 refills | Status: DC

## 2023-06-14 NOTE — Assessment & Plan Note (Signed)
She continues to smoke between 0.5-1 pack/day of cigarettes.  NicoDerm patches refilled at her request. -The patient was counseled on the dangers of tobacco use, and was advised to quit.  Reviewed strategies to maximize success, including removing cigarettes and smoking materials from environment, stress management, substitution of other forms of reinforcement, support of family/friends, and written materials.

## 2023-06-14 NOTE — Assessment & Plan Note (Signed)
Fluoxetine 80 mg daily refilled today.  Anxiety is adequately controlled with fluoxetine and as needed use of hydroxyzine.  She is looking for a new psychiatrist after recently being dismissed from her previous psychiatrist due to illicit controlled substance use.

## 2023-06-14 NOTE — Assessment & Plan Note (Signed)
Elevated ALP noted on labs from September 2024.  Repeat CMP and GGT ordered today.

## 2023-06-14 NOTE — Assessment & Plan Note (Signed)
Symptoms are well-controlled with Protonix 40 mg twice daily.

## 2023-06-14 NOTE — Assessment & Plan Note (Signed)
Euvolemic on exam today.  On appropriate GDMT.  Cardiology follow-up scheduled for next month.

## 2023-06-14 NOTE — Progress Notes (Signed)
Established Patient Office Visit  Subjective   Patient ID: [name], female    DOB: 05/16/1970  Age: 53 y.o. MRN: [id number]  Chief Complaint  Patient presents with   Care Management    Three month follow up    [name] returns to care today for routine follow-up.  She was last evaluated by me in September 2024 as a new patient presenting to establish care.  A referral was placed to psychiatry and NicoDerm patches were added to aid with smoking cessation.  44-month follow-up was arranged.  In the interim she has been evaluated by psychiatry and pulmonology.  There have otherwise been no acute interval events. [name] reports feeling well today. She is asymptomatic currently. Her acute concern is requesting refills of fluoxetine and mirtazapine while she attempts to find a new psychiatrist. She was recently dismissed from her psychiatrist due to illicit controlled substance use.   Past Medical History:  Diagnosis Date   Chronic abdominal pain    Chronic back pain    Colitis    Constipation    GERD (gastroesophageal reflux disease)    Gram-negative bacteremia 09/03/2022   Hypertension    Migraine    Plumbism    blood clot    PONV (postoperative nausea and vomiting)    Pyelonephritis 09/02/2022   Renal mass    Sepsis due to Escherichia coli (E. coli) (HCC) 09/03/2022   Sepsis due to undetermined organism (HCC) 09/02/2022   Uterine cancer [facility])    age 30   Past Surgical History:  Procedure Laterality Date   ABDOMINAL HYSTERECTOMY     BACK SURGERY     BIOPSY  07/07/2021   Procedure: BIOPSY;  Surgeon: Lanelle Bal, DO;  Location: [facility];  Service: Endoscopy;;   CHOLECYSTECTOMY     Complicated by bile leak requiring ERCP with temporary stenting   COLONOSCOPY WITH ESOPHAGOGASTRODUODENOSCOPY (EGD)  07/2009   [facility]: Op notes cannot be seen through care everywhere however pathology showed terminal ileum and random colon biopsies normal.  Stomach biopsy with  gastric antral and fundic mucosa with reactive foveolar hyperplasia.  No active gastritis.  Stains negative for H. pylori.   COLONOSCOPY WITH PROPOFOL N/A 07/07/2021   Surgeon: Earnest Bailey K, DO;  Non-bleeding internal hemorrhoids, normal TI  biopsied, normal colon biopsied. Pathology with colon and TI biopsies normal.   ESOPHAGEAL BRUSHING  07/07/2021   Procedure: ESOPHAGEAL BRUSHING;  Surgeon: Lanelle Bal, DO;  Location: [facility];  Service: Endoscopy;;   ESOPHAGOGASTRODUODENOSCOPY  06/2007   [facility]: Normal esophagus, gastric mucosal abnormality with erythema, biopsy showed gastric antral and fundic mucosa with reactive foveolar hyperplasia, no gastritis, no H. pylori.   ESOPHAGOGASTRODUODENOSCOPY (EGD) WITH PROPOFOL N/A 07/07/2021   Surgeon: Lanelle Bal, DO; moderately severe Candida esophagitis, gastritis, biopsies negative for H. pylori, normal examined duodenum.  Prescribed Diflucan and recommended PPI twice daily.   HAND SURGERY     Social History   Tobacco Use   Smoking status: Every Day    Current packs/day: 1.00    Average packs/day: 1 pack/day for 20.0 years (20.0 ttl pk-yrs)    Types: Cigarettes, E-cigarettes   Smokeless tobacco: Never   Tobacco comments:    Half a pack per day and utilizing nicotine patches as of 03/14/2023  Vaping Use   Vaping status: Never Used  Substance Use Topics   Alcohol use: Not Currently    Comment: Quit in 2021.  See psychiatry note  from 03/14/2023   Drug use: Yes    Types: Benzodiazepines, Hydrocodone    Comment: See psychiatry note from 04/09/23   Family History  Adopted: Yes  Problem Relation Age of Onset   Colon cancer Maternal Grandfather        80s   Colon cancer Maternal Uncle        41s   Cancer Other    Seizures Other    Stroke Other    Diabetes Other    Allergies  Allergen Reactions   Buprenorphine Other (See Comments)    Chest pain/ringing in ears and feet swelling   Penicillins Swelling and Rash         Aripiprazole Other (See Comments)    seizure   Seroquel [Quetiapine] Other (See Comments)    Seizure (08/18/20)   Cyclobenzaprine     Other Reaction(s): dizziness   Duloxetine Hcl     Other Reaction(s): Other (see comments)   Cefaclor Other (See Comments)    Muscles locked up   Doxycycline Rash   Erythromycin Rash   Gabapentin Nausea And Vomiting    dizzy   Ibuprofen Swelling and Rash   Levofloxacin Rash    Itching   Lisinopril Nausea And Vomiting   Macrobid [Nitrofurantoin Monohyd Macro] Rash   Naproxen Swelling and Rash   Pregabalin Rash   Review of Systems  Constitutional:  Negative for chills and fever.  HENT:  Negative for sore throat.   Respiratory:  Negative for cough and shortness of breath.   Cardiovascular:  Negative for chest pain, palpitations and leg swelling.  Gastrointestinal:  Negative for abdominal pain, blood in stool, constipation, diarrhea, nausea and vomiting.  Genitourinary:  Negative for dysuria and hematuria.  Musculoskeletal:  Negative for myalgias.  Skin:  Negative for itching and rash.  Neurological:  Negative for dizziness and headaches.  Psychiatric/Behavioral:  Negative for depression and suicidal ideas.      Objective:     BP 133/65 (BP Location: Left Arm, Patient Position: Sitting, Cuff Size: Normal)   Pulse 83   Ht 5\' 1"  (1.549 m)   Wt 150 lb 6.4 oz (68.2 kg)   SpO2 96%   BMI 28.42 kg/m  BP Readings from Last 3 Encounters:  06/14/23 133/65  04/04/23 130/69  02/21/23 105/66   Physical Exam Vitals reviewed.  Constitutional:      General: She is not in acute distress.    Appearance: Normal appearance. She is not toxic-appearing.  HENT:     Head: Normocephalic and atraumatic.     Right Ear: External ear normal.     Left Ear: External ear normal.     Nose: Nose normal. No congestion or rhinorrhea.     Mouth/Throat:     Mouth: Mucous membranes are moist.     Pharynx: Oropharynx is clear. No oropharyngeal exudate or posterior  oropharyngeal erythema.  Eyes:     General: No scleral icterus.    Extraocular Movements: Extraocular movements intact.     Conjunctiva/sclera: Conjunctivae normal.     Pupils: Pupils are equal, round, and reactive to light.  Cardiovascular:     Rate and Rhythm: Normal rate and regular rhythm.     Pulses: Normal pulses.     Heart sounds: Normal heart sounds. No murmur heard.    No friction rub. No gallop.  Pulmonary:     Effort: Pulmonary effort is normal.     Breath sounds: Normal breath sounds. No wheezing, rhonchi or rales.  Abdominal:  General: Abdomen is flat. Bowel sounds are normal. There is no distension.     Palpations: Abdomen is soft.     Tenderness: There is no abdominal tenderness.  Musculoskeletal:        General: No swelling. Normal range of motion.     Cervical back: Normal range of motion.     Right lower leg: No edema.     Left lower leg: No edema.  Lymphadenopathy:     Cervical: No cervical adenopathy.  Skin:    General: Skin is warm and dry.     Capillary Refill: Capillary refill takes less than 2 seconds.     Coloration: Skin is not jaundiced.  Neurological:     General: No focal deficit present.     Mental Status: She is alert and oriented to person, place, and time.  Psychiatric:        Mood and Affect: Mood normal.        Behavior: Behavior normal.   Last CBC Lab Results  Component Value Date   WBC 8.2 02/11/2023   HGB 12.9 02/11/2023   HCT 40.1 02/11/2023   MCV 96 02/11/2023   MCH 30.8 02/11/2023   RDW 13.0 02/11/2023   PLT 353 02/11/2023   Last metabolic panel Lab Results  Component Value Date   GLUCOSE 72 02/11/2023   NA 135 02/11/2023   K 4.7 02/11/2023   CL 97 02/11/2023   CO2 24 02/11/2023   BUN 7 02/11/2023   CREATININE 0.86 02/11/2023   EGFR 81 02/11/2023   CALCIUM 9.3 02/11/2023   PROT 7.5 02/11/2023   ALBUMIN 4.1 02/11/2023   LABGLOB 3.4 02/11/2023   BILITOT 0.3 02/11/2023   ALKPHOS 239 (H) 02/11/2023   AST 19  02/11/2023   ALT 14 02/11/2023   ANIONGAP 7 09/08/2022   Last lipids Lab Results  Component Value Date   CHOL 140 02/11/2023   HDL 31 (L) 02/11/2023   LDLCALC 73 02/11/2023   TRIG 214 (H) 02/11/2023   CHOLHDL 4.5 (H) 02/11/2023   Last hemoglobin A1c Lab Results  Component Value Date   HGBA1C 5.9 (H) 02/11/2023   Last thyroid functions Lab Results  Component Value Date   TSH 7.630 (H) 02/11/2023   Last vitamin D Lab Results  Component Value Date   VD25OH 20.3 (L) 02/11/2023   Last vitamin B12 and Folate Lab Results  Component Value Date   VITAMINB12 681 02/11/2023   FOLATE 6.3 02/11/2023   The 10-year ASCVD risk score (Arnett DK, et al., 2019) is: 7.8%    Assessment & Plan:   Problem List Items Addressed This Visit       Essential hypertension   Remains adequately controlled on current antihypertensive regimen.      Nonischemic cardiomyopathy (HCC)   Euvolemic on exam today.  On appropriate GDMT.  Cardiology follow-up scheduled for next month.      Gastroesophageal reflux disease   Symptoms are well-controlled with Protonix 40 mg twice daily.      Generalized anxiety disorder (Chronic)   Fluoxetine 80 mg daily refilled today.  Anxiety is adequately controlled with fluoxetine and as needed use of hydroxyzine.  She is looking for a new psychiatrist after recently being dismissed from her previous psychiatrist due to illicit controlled substance use.      Current tobacco use   She continues to smoke between 0.5-1 pack/day of cigarettes.  NicoDerm patches refilled at her request. -The patient was counseled on the dangers of tobacco use,  and was advised to quit.  Reviewed strategies to maximize success, including removing cigarettes and smoking materials from environment, stress management, substitution of other forms of reinforcement, support of family/friends, and written materials.       Psychophysiologic insomnia   Mirtazapine refilled today while patient  looks for a new psychiatrist as otherwise documented      Vitamin D insufficiency   Noted on previous labs.  Treated with high-dose, weekly vitamin D supplementation x 12 weeks.  Repeat vitamin D level ordered today.      Abnormal TSH - Primary   TSH 7.6 on labs from September 2024.  Free T4 within normal limits.  She is currently.  No history of hypothyroidism.  Repeat thyroid studies ordered today.      Elevated alkaline phosphatase level   Elevated ALP noted on labs from September 2024.  Repeat CMP and GGT ordered today.      Return in about 6 months (around 12/12/2023).   Billie Lade, MD

## 2023-06-14 NOTE — Assessment & Plan Note (Signed)
 Remains adequately controlled on current antihypertensive regimen.

## 2023-06-14 NOTE — Assessment & Plan Note (Signed)
Mirtazapine refilled today while patient looks for a new psychiatrist as otherwise documented

## 2023-06-14 NOTE — Assessment & Plan Note (Signed)
Noted on previous labs.  Treated with high-dose, weekly vitamin D supplementation x 12 weeks.  Repeat vitamin D level ordered today.

## 2023-06-14 NOTE — Assessment & Plan Note (Signed)
TSH 7.6 on labs from September 2024.  Free T4 within normal limits.  She is currently.  No history of hypothyroidism.  Repeat thyroid studies ordered today.

## 2023-06-14 NOTE — Patient Instructions (Signed)
 It was a pleasure to see you today.  Thank you for giving [place] the opportunity to be involved in your care.  Below is a brief recap of your visit and next steps.  We will plan to see you again in 6 months.  Summary No medication changes today Refills provided Repeat labs ordered Follow up in 6 months

## 2023-06-15 ENCOUNTER — Encounter (HOSPITAL_COMMUNITY): Payer: Self-pay

## 2023-06-15 ENCOUNTER — Emergency Department (HOSPITAL_COMMUNITY)
Admission: EM | Admit: 2023-06-15 | Discharge: 2023-06-15 | Disposition: A | Discharge status: Home / Self Care | Payer: Medicaid Other | Attending: Emergency Medicine | Admitting: Emergency Medicine

## 2023-06-15 ENCOUNTER — Other Ambulatory Visit: Payer: Self-pay

## 2023-06-15 ENCOUNTER — Emergency Department (HOSPITAL_COMMUNITY): Payer: Medicaid Other

## 2023-06-15 ENCOUNTER — Encounter (INDEPENDENT_AMBULATORY_CARE_PROVIDER_SITE_OTHER): Payer: Self-pay

## 2023-06-15 DIAGNOSIS — S62616A Displaced fracture of proximal phalanx of right little finger, initial encounter for closed fracture: Secondary | ICD-10-CM | POA: Diagnosis not present

## 2023-06-15 DIAGNOSIS — S6991XA Unspecified injury of right wrist, hand and finger(s), initial encounter: Secondary | ICD-10-CM | POA: Diagnosis present

## 2023-06-15 DIAGNOSIS — W182XXA Fall in (into) shower or empty bathtub, initial encounter: Secondary | ICD-10-CM | POA: Insufficient documentation

## 2023-06-15 DIAGNOSIS — S62619A Displaced fracture of proximal phalanx of unspecified finger, initial encounter for closed fracture: Secondary | ICD-10-CM

## 2023-06-15 LAB — TSH+FREE T4
Free T4: 0.78 ng/dL — ABNORMAL LOW (ref 0.82–1.77)
TSH: 6.05 u[IU]/mL — ABNORMAL HIGH (ref 0.450–4.500)

## 2023-06-15 LAB — CMP14+EGFR
ALT: 47 [IU]/L — ABNORMAL HIGH (ref 0–32)
AST: 51 [IU]/L — ABNORMAL HIGH (ref 0–40)
Albumin: 3.8 g/dL (ref 3.8–4.9)
Alkaline Phosphatase: 244 [IU]/L — ABNORMAL HIGH (ref 44–121)
BUN/Creatinine Ratio: 10 (ref 9–23)
BUN: 8 mg/dL (ref 6–24)
Bilirubin Total: <0.2 mg/dL (ref 0.0–1.2)
CO2: 23 mmol/L (ref 20–29)
Calcium: 8.1 mg/dL — ABNORMAL LOW (ref 8.7–10.2)
Chloride: 104 mmol/L (ref 96–106)
Creatinine, Ser: 0.83 mg/dL (ref 0.57–1.00)
Globulin, Total: 3 g/dL (ref 1.5–4.5)
Glucose: 101 mg/dL — ABNORMAL HIGH (ref 70–99)
Potassium: 4.5 mmol/L (ref 3.5–5.2)
Sodium: 142 mmol/L (ref 134–144)
Total Protein: 6.8 g/dL (ref 6.0–8.5)
eGFR: 84 mL/min/{1.73_m2} (ref >59)

## 2023-06-15 LAB — VITAMIN D 25 HYDROXY (VIT D DEFICIENCY, FRACTURES): Vit D, 25-Hydroxy: 25.6 ng/mL — ABNORMAL LOW (ref 30.0–100.0)

## 2023-06-15 LAB — GAMMA GT: GGT: 45 [IU]/L (ref 0–60)

## 2023-06-15 MED ORDER — OXYCODONE-ACETAMINOPHEN 5-325 MG PO TABS
1.0000 | ORAL_TABLET | Freq: Four times a day (QID) | ORAL | 0 refills | Status: DC | PRN
Start: 2023-06-15 — End: 2023-06-19

## 2023-06-15 NOTE — ED Notes (Signed)
Ice applied to hand, extremity elevated

## 2023-06-15 NOTE — ED Provider Notes (Signed)
[facility] EMERGENCY DEPARTMENT AT [facility] Provider Note   CSN: [id number] Arrival date & time: 06/15/23  1435     History  Chief Complaint  Patient presents with   Hand Injury    [name] is a 53 y.o. female.   Hand Injury  53 year old female not anticoagulated presenting with a complaint of right hand pain after she fell in the shower earlier today.  She tried to catch herself with her right hand on the edge of the bathtub and caused hyper extension of her right fourth and fifth fingers.  Her pain is located over the fifth MCP joint with associated bruising and swelling.  Denies any other injuries    Home Medications Prior to Admission medications   Medication Sig Start Date End Date Taking? Authorizing Provider  oxyCODONE-acetaminophen (PERCOCET/ROXICET) 5-325 MG tablet Take 1 tablet by mouth every 6 (six) hours as needed for severe pain (pain score 7-10). 06/15/23  Yes Eber Hong, MD  albuterol (VENTOLIN HFA) 108 (90 Base) MCG/ACT inhaler Inhale 2 puffs into the lungs every 4 (four) hours as needed for shortness of breath or wheezing. 04/30/22   [provider]  atorvastatin (LIPITOR) 40 MG tablet Take 1 tablet (40 mg total) by mouth daily. 11/19/22 02/17/23  Sharlene Dory, NP  buprenorphine-naloxone (SUBOXONE) 2-0.5 mg SUBL SL tablet Place under the tongue. 04/02/23   [provider]  dicyclomine (BENTYL) 20 MG tablet TAKE ONE TABLET BY MOUTH FOUR TIMES DAILY BEFORE MEALS AND AT BEDTIME. TAKE ONE TABLET THREE TIMES DAILY WITH FOOD. HOLD FOR CONSTIPATION. 05/16/23   Letta Median, PA-C  famotidine (PEPCID) 20 MG tablet Take 20 mg by mouth daily. For break through heart burn    [provider]  FLUoxetine (PROZAC) 40 MG capsule Take 2 capsules (80 mg total) by mouth daily. 06/14/23 12/11/23  Billie Lade, MD  furosemide (LASIX) 40 MG tablet Take 1 tablet (40 mg total) by mouth daily as needed (swelling). 11/12/22   Sharlene Dory,  NP  hydrOXYzine (ATARAX/VISTARIL) 50 MG tablet Take 50 mg by mouth every 6 (six) hours as needed for anxiety. 09/29/20   [provider]  ipratropium-albuterol (DUONEB) 0.5-2.5 (3) MG/3ML SOLN Take 3 mLs by nebulization every 4 (four) hours as needed (SOB, wheezing, coughing). 09/09/22   Johnson, Clanford L, MD  metoprolol succinate (TOPROL-XL) 100 MG 24 hr tablet Take 1 tablet (100 mg total) by mouth daily. Take with or immediately following a meal. 03/28/23   Mallipeddi, Vishnu P, MD  mirtazapine (REMERON) 30 MG tablet Take 1 tablet (30 mg total) by mouth at bedtime. 06/14/23   Billie Lade, MD  Nebulizer System All-In-One MISC 1 Device by Does not apply route as directed. 09/09/22   Johnson, Clanford L, MD  nicotine (NICOTINE STEP 2) 14 mg/24hr patch Place 1 patch (14 mg total) onto the skin daily. 06/14/23   Billie Lade, MD  nitroGLYCERIN (NITROSTAT) 0.4 MG SL tablet Place 1 tablet (0.4 mg total) under the tongue every 5 (five) minutes as needed for chest pain. Patient taking differently: Place 0.4 mg under the tongue every 5 (five) minutes x 3 doses as needed for chest pain (if no relief after 3rd dose, proceed to ED or call 911). 09/09/22   Johnson, Clanford L, MD  ondansetron (ZOFRAN-ODT) 8 MG disintegrating tablet TAKE ONE TABLET BY MOUTH EVERY 8 HOURS 05/30/23   Letta Median, PA-C  pantoprazole (PROTONIX) 40 MG tablet TAKE (1) TABLET BY MOUTH  TWICE A DAY BEFORE MEALS. (BREAKFAST AND SUPPER) 11/30/22   Letta Median, PA-C  potassium chloride SA (KLOR-CON M) 20 MEQ tablet Take 1 tablet (20 mEq total) by mouth daily as needed. 11/12/22   Sharlene Dory, NP  promethazine (PHENERGAN) 25 MG tablet TAKE ONE TABLET BY MOUTH EVERY 6 HOURS AS NEEDED FOR NAUSEA OR VOMITING. 06/06/23   Ermalinda Memos S, PA-C  sacubitril-valsartan (ENTRESTO) 24-26 MG TAKE ONE TABLET BY MOUTH TWICE DAILY 05/13/23   Mallipeddi, Vishnu P, MD  spironolactone (ALDACTONE) 25 MG tablet Take 0.5 tablets (12.5 mg  total) by mouth daily. 11/12/22   Sharlene Dory, NP  SYMBICORT 160-4.5 MCG/ACT inhaler Inhale 2 puffs into the lungs. 04/04/23   [provider]      Allergies    Buprenorphine, Penicillins, Aripiprazole, Seroquel [quetiapine], Cyclobenzaprine, Duloxetine hcl, Cefaclor, Doxycycline, Erythromycin, Gabapentin, Ibuprofen, Levofloxacin, Lisinopril, Macrobid [nitrofurantoin monohyd macro], Naproxen, and Pregabalin    Review of Systems   Review of Systems  All other systems reviewed and are negative.   Physical Exam Updated Vital Signs BP (!) 120/98 (BP Location: Left Arm)   Pulse 70   Temp 98.4 F (36.9 C) (Oral)   Resp 18   Ht 1.549 m (5\' 1" )   Wt 68 kg   SpO2 100%   BMI 28.34 kg/m  Physical Exam Vitals and nursing note reviewed.  Constitutional:      Appearance: She is well-developed. She is not diaphoretic.  HENT:     Head: Normocephalic and atraumatic.  Eyes:     General:        Right eye: No discharge.        Left eye: No discharge.     Conjunctiva/sclera: Conjunctivae normal.  Pulmonary:     Effort: Pulmonary effort is normal. No respiratory distress.  Musculoskeletal:     Comments: Tenderness over the right MCP of the fifth finger, decreased range of motion, there is swelling and bruising at this location, the right small finger is held in slight ulnar deviation, normal capillary refill and normal sensation to the finger, no open wounds, no tenderness over the wrist, no tenderness over the other fingers  Skin:    General: Skin is warm and dry.     Findings: No erythema or rash.  Neurological:     Mental Status: She is alert.     Coordination: Coordination normal.     ED Results / Procedures / Treatments   Labs (all labs ordered are listed, but only abnormal results are displayed) Labs Reviewed - No data to display  EKG None  Radiology DG Hand Complete Right Result Date: 06/15/2023 CLINICAL DATA:  Right hand pain after fall today. EXAM: RIGHT HAND -  COMPLETE 3+ VIEW COMPARISON:  None Available. FINDINGS: Moderately angulated and comminuted fracture is seen involving the fifth proximal phalanx. Joint spaces are intact. No soft tissue abnormality. IMPRESSION: Moderately angulated and comminuted fifth proximal phalangeal fracture. Electronically Signed   By: Lupita Raider M.D.   On: 06/15/2023 15:33    Procedures Procedures    Medications Ordered in ED Medications - No data to display  ED Course/ Medical Decision Making/ A&P                                 Medical Decision Making Amount and/or Complexity of Data Reviewed Radiology: ordered.   I have personally viewed and interpreted the x-ray of the patient's right  hand, there does appear to be a displaced comminuted and slightly angulated fracture of the right proximal phalanx of the small finger.  She has no open wound, this was immobilized with buddy tape and a splint  Patient referred to hand surgery for outpatient therapy  RICE therapy recommended, patient agreeable        Final Clinical Impression(s) / ED Diagnoses Final diagnoses:  Closed displaced fracture of proximal phalanx of finger of right hand    Rx / DC Orders ED Discharge Orders          Ordered    oxyCODONE-acetaminophen (PERCOCET/ROXICET) 5-325 MG tablet  Every 6 hours PRN        06/15/23 1546              Eber Hong, MD 06/15/23 1548

## 2023-06-15 NOTE — Discharge Instructions (Addendum)
Please follow-up with Dr. Janee Morn, see the phone number listed above, he is on-call for hand surgery today.  You will need to wear the splint until you follow-up, keep the arm elevated, ice packs, I have given you a small amount of Percocet but be aware that if you are taking buprenorphine (Suboxone) this medication is going to complicate your treatment and I would strongly recommend that you stick to Tylenol 1000 mg every 6 hours.  Thank you for allowing [place] to treat you in the emergency department today.  After reviewing your examination and potential testing that was done it appears that you are safe to go home.  I would like for you to follow-up with your doctor within the next several days, have them obtain your records and follow-up with them to review all potential tests and results from your visit.  If you should develop severe or worsening symptoms return to the emergency department immediately

## 2023-06-15 NOTE — ED Triage Notes (Signed)
Pt fell in her shower this morning and caught herself with her right hand. Pt stated that when she fell, her right finger and pinky finger got caught on the side of the tub and bent back. Pt's hand is visibly swollen and discolored and she is unable move hand. Pt took tylenol for pain

## 2023-06-15 NOTE — ED Notes (Signed)
After splint can feel and wiggle fingers. Color wnl. Arm sling applied. Instructions on elevation/keeping dry given with verbal understanding

## 2023-06-16 ENCOUNTER — Encounter: Payer: Self-pay | Admitting: Internal Medicine

## 2023-06-16 DIAGNOSIS — J449 Chronic obstructive pulmonary disease, unspecified: Secondary | ICD-10-CM | POA: Diagnosis not present

## 2023-06-17 DIAGNOSIS — Z79891 Long term (current) use of opiate analgesic: Secondary | ICD-10-CM | POA: Diagnosis not present

## 2023-06-17 NOTE — Progress Notes (Deleted)
   Intake history:  There were no vitals taken for this visit. There is no height or weight on file to calculate BMI.    WHAT ARE WE SEEING YOU FOR TODAY?   {Location ext ZOXW:96045}  How long has this bothered you? (DOI?DOS?WS?)  {TIME; AGO:17960}  Anticoag.  {yes/no:20286}  Diabetes {yes/no:20286}  Heart disease {yes/no:20286}  Hypertension {yes/no:20286}  SMOKING HX {yes/no:20286}  Kidney disease {yes/no:20286}  Any ALLERGIES ______________________________________________   Treatment:  Have you taken:  Tylenol {yes/no:20286}  Advil {yes/no:20286}  Had PT {yes/no:20286}  Had injection {yes/no:20286}  Other  _________________________

## 2023-06-18 ENCOUNTER — Encounter (HOSPITAL_COMMUNITY): Payer: Self-pay | Admitting: Orthopedic Surgery

## 2023-06-18 ENCOUNTER — Other Ambulatory Visit: Payer: Self-pay

## 2023-06-18 ENCOUNTER — Other Ambulatory Visit: Payer: Self-pay | Admitting: Orthopedic Surgery

## 2023-06-18 DIAGNOSIS — S62616A Displaced fracture of proximal phalanx of right little finger, initial encounter for closed fracture: Secondary | ICD-10-CM | POA: Diagnosis not present

## 2023-06-18 DIAGNOSIS — S89202A Unspecified physeal fracture of upper end of left fibula, initial encounter for closed fracture: Secondary | ICD-10-CM | POA: Diagnosis not present

## 2023-06-18 NOTE — Anesthesia Preprocedure Evaluation (Addendum)
 Anesthesia Evaluation  Patient identified by MRN, date of birth, ID band Patient awake    Reviewed: Allergy & Precautions, NPO status , Patient's Chart, lab work & pertinent test results  History of Anesthesia Complications (+) PONV and history of anesthetic complications  Airway Mallampati: II  TM Distance: >3 FB Neck ROM: Full    Dental  (+) Edentulous Upper, Edentulous Lower, Lower Dentures, Upper Dentures   Pulmonary shortness of breath and Long-Term Oxygen  Therapy, COPD,  oxygen  dependent, Current Smoker and Patient abstained from smoking.   breath sounds clear to auscultation       Cardiovascular hypertension, Pt. on medications and Pt. on home beta blockers +CHF and + DOE   Rhythm:Regular   1. Left ventricular ejection fraction, by estimation, is 60 to 65%. The  left ventricle has normal function. The left ventricle has no regional  wall motion abnormalities. Left ventricular diastolic parameters are  indeterminate. The average left  ventricular global longitudinal strain is -21.4 %. The global longitudinal  strain is normal.   2. Right ventricular systolic function is normal. The right ventricular  size is normal.   3. Limited echo evaluate LV function     Neuro/Psych  Headaches PSYCHIATRIC DISORDERS Anxiety Depression     Neuromuscular disease    GI/Hepatic ,GERD  Medicated and Controlled,,  Endo/Other  negative endocrine ROS    Renal/GU CRFRenal disease     Musculoskeletal  (+) Arthritis ,    Abdominal   Peds  Hematology  (+) Blood dyscrasia, anemia Lab Results      Component                Value               Date                      WBC                      7.4                 06/19/2023                HGB                      11.5 (L)            06/19/2023                HCT                      35.9 (L)            06/19/2023                MCV                      94.5                06/19/2023                 PLT                      292                 06/19/2023              Anesthesia Other Findings   Reproductive/Obstetrics  Anesthesia Physical Anesthesia Plan  ASA: 3  Anesthesia Plan: MAC   Post-op Pain Management:    Induction:   PONV Risk Score and Plan:   Airway Management Planned:   Additional Equipment:   Intra-op Plan:   Post-operative Plan:   Informed Consent:   Plan Discussed with:   Anesthesia Plan Comments: (PAT note by Lynwood Hope, PA-C:  53 yo female follows with cardiology for hx of HTN and HFrEF. EF initially found to be low on during admission 08/2022 severe sepsis d/t UTI, E. coli bacteremia, pyelonephritis, colonic wall thickening/ colitis, and lobar pneumonia. Updated Echo revealed recovered EF 10/03/2022. Last seen by Almarie Crate, NP on 11/12/22, stable at that time, recommended continue current meds and f/u in 6 months.   Follows with pulmonology for hx of DOE, upper airway cough syndrome. Last seen 04/04/23. Per note, She is clearly more deconditioned than limited by any cardiopulmonary problem so rec pfts/ referral to pulmonary rehab.SABRASABRAI really don't think she has much copd based on today's eval with clear lungs and ex sats so high so will try a lower dose of ICS with less potential irritation of the upper airway.SABRASABRANo need for 02 except at bedtime.  Other pertinent hx includes GERD, PONV, PTSD.  CMP 06/14/23 reviewed, unremarkable.   Pt will need DOS labs and eval.  EKG 09/04/22:  Normal sinus rhythm. Rate 97. Rightward axis. Cannot rule out Anterior infarct , age undetermined. Prolonged QT (Qtc 510).  Limited Echo 09/2022:   1. Left ventricular ejection fraction, by estimation, is 60 to 65%. The  left ventricle has normal function. The left ventricle has no regional  wall motion abnormalities. Left ventricular diastolic parameters are  indeterminate. The average left  ventricular  global longitudinal strain is -21.4 %. The global longitudinal  strain is normal.   2. Right ventricular systolic function is normal. The right ventricular  size is normal.   3. Limited echo evaluate LV function   Comparison(s): Echocardiogram done 09/05/22 showed an EF of 25-30%.  )         Anesthesia Quick Evaluation

## 2023-06-18 NOTE — Progress Notes (Signed)
 PCP - Dr Manus Fireman Cardiologist - Dr Diannah Maywood Pulmonology - Dr Ozell America  Chest x-ray - 02/21/23 EKG - 09/04/22 Stress Test - n/a ECHO Limited  - 10/02/22 Cardiac Cath - n/a  ICD Pacemaker/Loop - n/a  Sleep Study -  n/a  Diabetes - n/a  Suboxone (SDW call) - Last dose was on 06/18/23.  Nitroglycerin  - If you have to take this medication prior to surgery, please call [phone] and report this to a nurse   Aspirin and Blood Thinner Instructions:  n/a  ERAS - Clear liquids til 10 AM DOS.  Anesthesia review: Yes  STOP now taking any Aspirin (unless otherwise instructed by your surgeon), Aleve, Naproxen, Ibuprofen, Motrin, Advil, Goody's, BC's, all herbal medications, fish oil, and all vitamins.   Coronavirus Screening Do you have any of the following symptoms:  Cough yes/no: No Fever (>100.61F)  yes/no: No Runny nose yes/no: No Sore throat yes/no: No Difficulty breathing/shortness of breath  Yes - COPD  Have you traveled in the last 14 days and where? yes/no: No  Patient verbalized understanding of instructions that were given via phone.

## 2023-06-18 NOTE — H&P (Signed)
 History: CC / Reason for Visit: Right hand and left leg pain HPI: This patient is a 53 year old female who presents for evaluation of a right hand injury that occurred when she slipped and fell in the shower.  She was evaluated at [facility] in the emergency department where she was noted to have a right small finger P1 fracture and presents in an ulnar gutter splint.  She reports that although it did not hurt right away, since then she has developed pain on the lateral aspect of the leg a few inches distal to the knee joint causing her to limp with weightbearing.  Past medical history, past surgical history, family history, social history, medications, allergies and review of systems are thoroughly reviewed by me, signed and scanned into SRS today.    Exam:  Vitals: Refer to EMR. Constitutional:  WD, WN, NAD HEENT:  NCAT, EOMI Neuro/Psych:  Alert & oriented to person, place, and time; appropriate mood & affect Lymphatic: No generalized UE edema or lymphadenopathy Extremities / MSK:  Both UE are normal with respect to appearance, ranges of motion, joint stability, muscle strength/tone, sensation, & perfusion except as otherwise noted:  The right hand small finger is swollen and bruised, with slight ulnar angulation and lack of full flexion.  Neurovascularly intact.  Left leg is slightly swollen, fairly tender along the fibula a few inches distal from the knee joint, without associated tibial tenderness with palpation.  NVI, limps with weightbearing  Labs / Xrays:  3 views of the right small finger ordered and obtained today reveals a comminuted intra-articular fracture the base of the small finger proximal phalanx, with dorsal ulnar angulation through the metaphyseal portion of the fracture and minimal intra-articular incongruity  3 views of the left knee ordered and obtained today reveals a nondisplaced fracture of the proximal fibula diaphysis  Assessment: 1.  Displaced closed right small  finger P1 fracture 2.  Nondisplaced left proximal fibula fracture  Plan:  I discussed these findings with her.  I recommended operative treatment to obtain and maintain a more anatomic alignment of the right small finger proximal phalanx in order to optimize functional recovery.  We are searching for OR availability in the next few days.  Additionally, the recommended weightbearing as tolerated with regard to left lower extremity, but she was fitted with crutches today to a allow that using the left hand to share in weightbearing.  We discussed maximizing OTC medications through the course of today and we will likely prescribe some additional narcotic pain medications tomorrow postoperatively.  I anticipate the need to begin formal therapy at [facility] have a custom splint fabricated and begin motion exercises for the right hand next week, returning for reevaluation in 10-15 days with new x-rays of the right small finger, out of the splint, and also 3 views of the left knee with an expanded field.  Work status: All interested parties should please consider this patient to be out of work entirely if no work is available that complies with the restrictions detailed below (if any).  If these restrictions result in the patient being out of work entirely, the employer is expected to provide documentation of such to all interested third parties such as disability insurance companies: No right-handed work

## 2023-06-18 NOTE — Progress Notes (Signed)
 Anesthesia Chart Review: Same day workup  53 yo female follows with cardiology for hx of HTN and HFrEF. EF initially found to be low on during admission 08/2022 severe sepsis d/t UTI, E. coli bacteremia, pyelonephritis, colonic wall thickening/ colitis, and lobar pneumonia. Updated Echo revealed recovered EF 10/03/2022. Last seen by Almarie Crate, NP on 11/12/22, stable at that time, recommended continue current meds and f/u in 6 months.   Follows with pulmonology for hx of DOE, upper airway cough syndrome. Last seen 04/04/23. Per note, She is clearly more deconditioned than limited by any cardiopulmonary problem so rec pfts/ referral to pulmonary rehab.SABRASABRAI really don't think she has much copd based on today's eval with clear lungs and ex sats so high so will try a lower dose of ICS with less potential irritation of the upper airway.SABRASABRANo need for 02 except at bedtime.  Other pertinent hx includes GERD, PONV, PTSD.  CMP 06/14/23 reviewed, unremarkable.   Pt will need DOS labs and eval.  EKG 09/04/22:  Normal sinus rhythm. Rate 97. Rightward axis. Cannot rule out Anterior infarct , age undetermined. Prolonged QT (Qtc 510).  Limited Echo 09/2022:   1. Left ventricular ejection fraction, by estimation, is 60 to 65%. The  left ventricle has normal function. The left ventricle has no regional  wall motion abnormalities. Left ventricular diastolic parameters are  indeterminate. The average left  ventricular global longitudinal strain is -21.4 %. The global longitudinal  strain is normal.   2. Right ventricular systolic function is normal. The right ventricular  size is normal.   3. Limited echo evaluate LV function   Comparison(s): Echocardiogram done 09/05/22 showed an EF of 25-30%.    Lynwood Geofm RIGGERS [facility] Short Stay Center/Anesthesiology Phone [phone] 06/18/2023 12:51 PM

## 2023-06-19 ENCOUNTER — Ambulatory Visit (HOSPITAL_COMMUNITY)
Admission: RE | Admit: 2023-06-19 | Discharge: 2023-06-19 | Disposition: A | Discharge status: Home / Self Care | Payer: Medicaid Other | Attending: Orthopedic Surgery | Admitting: Orthopedic Surgery

## 2023-06-19 ENCOUNTER — Other Ambulatory Visit: Payer: Self-pay

## 2023-06-19 ENCOUNTER — Ambulatory Visit (HOSPITAL_COMMUNITY): Payer: Self-pay | Admitting: Physician Assistant

## 2023-06-19 ENCOUNTER — Encounter (HOSPITAL_COMMUNITY)
Admission: RE | Disposition: A | Discharge status: Home / Self Care | Payer: Self-pay | Source: Home / Self Care | Attending: Orthopedic Surgery

## 2023-06-19 ENCOUNTER — Ambulatory Visit (HOSPITAL_COMMUNITY): Payer: Medicaid Other

## 2023-06-19 ENCOUNTER — Ambulatory Visit: Payer: Medicaid Other | Admitting: Orthopedic Surgery

## 2023-06-19 ENCOUNTER — Ambulatory Visit (HOSPITAL_COMMUNITY): Payer: Medicaid Other | Admitting: Physician Assistant

## 2023-06-19 ENCOUNTER — Encounter (HOSPITAL_COMMUNITY): Payer: Self-pay | Admitting: Orthopedic Surgery

## 2023-06-19 DIAGNOSIS — I129 Hypertensive chronic kidney disease with stage 1 through stage 4 chronic kidney disease, or unspecified chronic kidney disease: Secondary | ICD-10-CM | POA: Diagnosis not present

## 2023-06-19 DIAGNOSIS — I5020 Unspecified systolic (congestive) heart failure: Secondary | ICD-10-CM | POA: Insufficient documentation

## 2023-06-19 DIAGNOSIS — F4310 Post-traumatic stress disorder, unspecified: Secondary | ICD-10-CM | POA: Insufficient documentation

## 2023-06-19 DIAGNOSIS — W182XXA Fall in (into) shower or empty bathtub, initial encounter: Secondary | ICD-10-CM | POA: Insufficient documentation

## 2023-06-19 DIAGNOSIS — I110 Hypertensive heart disease with heart failure: Secondary | ICD-10-CM | POA: Diagnosis not present

## 2023-06-19 DIAGNOSIS — Y93E1 Activity, personal bathing and showering: Secondary | ICD-10-CM | POA: Insufficient documentation

## 2023-06-19 DIAGNOSIS — Z9981 Dependence on supplemental oxygen: Secondary | ICD-10-CM | POA: Insufficient documentation

## 2023-06-19 DIAGNOSIS — F17210 Nicotine dependence, cigarettes, uncomplicated: Secondary | ICD-10-CM

## 2023-06-19 DIAGNOSIS — Z79899 Other long term (current) drug therapy: Secondary | ICD-10-CM | POA: Diagnosis not present

## 2023-06-19 DIAGNOSIS — S62616A Displaced fracture of proximal phalanx of right little finger, initial encounter for closed fracture: Secondary | ICD-10-CM

## 2023-06-19 DIAGNOSIS — J449 Chronic obstructive pulmonary disease, unspecified: Secondary | ICD-10-CM | POA: Insufficient documentation

## 2023-06-19 DIAGNOSIS — N182 Chronic kidney disease, stage 2 (mild): Secondary | ICD-10-CM | POA: Diagnosis not present

## 2023-06-19 DIAGNOSIS — S82832A Other fracture of upper and lower end of left fibula, initial encounter for closed fracture: Secondary | ICD-10-CM | POA: Diagnosis not present

## 2023-06-19 DIAGNOSIS — S62616B Displaced fracture of proximal phalanx of right little finger, initial encounter for open fracture: Secondary | ICD-10-CM | POA: Diagnosis present

## 2023-06-19 DIAGNOSIS — K219 Gastro-esophageal reflux disease without esophagitis: Secondary | ICD-10-CM | POA: Diagnosis not present

## 2023-06-19 HISTORY — DX: Pneumonia, unspecified organism: J18.9

## 2023-06-19 HISTORY — DX: Unspecified osteoarthritis, unspecified site: M19.90

## 2023-06-19 HISTORY — DX: Major depressive disorder, single episode, unspecified: F32.9

## 2023-06-19 HISTORY — PX: OPEN REDUCTION INTERNAL FIXATION (ORIF) PROXIMAL PHALANX: SHX6235

## 2023-06-19 HISTORY — DX: Heart failure, unspecified: I50.9

## 2023-06-19 HISTORY — DX: Cyst of kidney, acquired: N28.1

## 2023-06-19 HISTORY — DX: Anxiety disorder, unspecified: F41.9

## 2023-06-19 HISTORY — DX: Dependence on supplemental oxygen: Z99.81

## 2023-06-19 HISTORY — DX: Post-traumatic stress disorder, unspecified: F43.10

## 2023-06-19 HISTORY — DX: Insomnia, unspecified: G47.00

## 2023-06-19 HISTORY — DX: Dyspnea, unspecified: R06.00

## 2023-06-19 LAB — COMPREHENSIVE METABOLIC PANEL
ALT: 36 U/L (ref 0–44)
AST: 41 U/L (ref 15–41)
Albumin: 3.2 g/dL — ABNORMAL LOW (ref 3.5–5.0)
Alkaline Phosphatase: 207 U/L — ABNORMAL HIGH (ref 38–126)
Anion gap: 11 (ref 5–15)
BUN: 10 mg/dL (ref 6–20)
CO2: 23 mmol/L (ref 22–32)
Calcium: 8.6 mg/dL — ABNORMAL LOW (ref 8.9–10.3)
Chloride: 103 mmol/L (ref 98–111)
Creatinine, Ser: 0.99 mg/dL (ref 0.44–1.00)
GFR, Estimated: >60 mL/min (ref >60)
Glucose, Bld: 84 mg/dL (ref 70–99)
Potassium: 4.3 mmol/L (ref 3.5–5.1)
Sodium: 137 mmol/L (ref 135–145)
Total Bilirubin: 0.6 mg/dL (ref 0.0–1.2)
Total Protein: 7.5 g/dL (ref 6.5–8.1)

## 2023-06-19 LAB — CBC
HCT: 35.9 % — ABNORMAL LOW (ref 36.0–46.0)
Hemoglobin: 11.5 g/dL — ABNORMAL LOW (ref 12.0–15.0)
MCH: 30.3 pg (ref 26.0–34.0)
MCHC: 32 g/dL (ref 30.0–36.0)
MCV: 94.5 fL (ref 80.0–100.0)
Platelets: 292 10*3/uL (ref 150–400)
RBC: 3.8 MIL/uL — ABNORMAL LOW (ref 3.87–5.11)
RDW: 15.6 % — ABNORMAL HIGH (ref 11.5–15.5)
WBC: 7.4 10*3/uL (ref 4.0–10.5)
nRBC: 0 % (ref 0.0–0.2)

## 2023-06-19 SURGERY — OPEN REDUCTION INTERNAL FIXATION (ORIF) PROXIMAL PHALANX
Anesthesia: Monitor Anesthesia Care | Site: Hand | Laterality: Right

## 2023-06-19 MED ORDER — ACETAMINOPHEN 10 MG/ML IV SOLN: 1000.0000 mg | Freq: Once | INTRAVENOUS | Status: DC | PRN

## 2023-06-19 MED ORDER — FENTANYL CITRATE (PF) 100 MCG/2ML IJ SOLN
25.0000 ug | INTRAMUSCULAR | Status: DC | PRN
Start: 2023-06-19 — End: 2023-06-19

## 2023-06-19 MED ORDER — ACETAMINOPHEN 160 MG/5ML PO SOLN: 1000.0000 mg | Freq: Once | ORAL | Status: DC | PRN

## 2023-06-19 MED ORDER — FENTANYL CITRATE (PF) 100 MCG/2ML IJ SOLN
INTRAMUSCULAR | Status: AC
  Administered 2023-06-19: 100 ug via INTRAVENOUS
  Filled 2023-06-19: qty 2

## 2023-06-19 MED ORDER — OXYCODONE HCL 5 MG PO TABS: 5.0000 mg | ORAL_TABLET | Freq: Once | ORAL | Status: DC | PRN

## 2023-06-19 MED ORDER — ONDANSETRON HCL 4 MG/2ML IJ SOLN
INTRAMUSCULAR | Status: AC
  Filled 2023-06-19: qty 2

## 2023-06-19 MED ORDER — ORAL CARE MOUTH RINSE: 15.0000 mL | Freq: Once | OROMUCOSAL | Status: AC

## 2023-06-19 MED ORDER — SODIUM CHLORIDE (PF) 0.9 % IJ SOLN
INTRAMUSCULAR | Status: AC
  Filled 2023-06-19: qty 10

## 2023-06-19 MED ORDER — MIDAZOLAM HCL 2 MG/2ML IJ SOLN
INTRAMUSCULAR | Status: AC
  Filled 2023-06-19: qty 2

## 2023-06-19 MED ORDER — MIDAZOLAM HCL 2 MG/2ML IJ SOLN
2.0000 mg | Freq: Once | INTRAMUSCULAR | Status: AC
Start: 2023-06-19 — End: 2023-06-19

## 2023-06-19 MED ORDER — LIDOCAINE 2% (20 MG/ML) 5 ML SYRINGE
INTRAMUSCULAR | Status: AC
  Filled 2023-06-19: qty 5

## 2023-06-19 MED ORDER — ONDANSETRON HCL 4 MG/2ML IJ SOLN
INTRAMUSCULAR | Status: DC | PRN
  Administered 2023-06-19: 4 mg via INTRAVENOUS

## 2023-06-19 MED ORDER — GENTAMICIN SULFATE 40 MG/ML IJ SOLN
5.0000 mg/kg | INTRAVENOUS | Status: AC
  Administered 2023-06-19: 340 mg via INTRAVENOUS
  Filled 2023-06-19: qty 8.5

## 2023-06-19 MED ORDER — OXYCODONE HCL 5 MG PO TABS: 5.0000 mg | ORAL_TABLET | Freq: Four times a day (QID) | ORAL | 0 refills | Status: DC | PRN

## 2023-06-19 MED ORDER — ACETAMINOPHEN 500 MG PO TABS
1000.0000 mg | ORAL_TABLET | Freq: Once | ORAL | Status: AC
  Administered 2023-06-19: 1000 mg via ORAL
  Filled 2023-06-19: qty 2

## 2023-06-19 MED ORDER — PROPOFOL 10 MG/ML IV BOLUS
INTRAVENOUS | Status: AC
  Filled 2023-06-19: qty 20

## 2023-06-19 MED ORDER — MIDAZOLAM HCL 2 MG/2ML IJ SOLN
INTRAMUSCULAR | Status: AC
  Administered 2023-06-19: 2 mg via INTRAVENOUS
  Filled 2023-06-19: qty 2

## 2023-06-19 MED ORDER — VANCOMYCIN HCL IN DEXTROSE 1-5 GM/200ML-% IV SOLN
1000.0000 mg | INTRAVENOUS | Status: AC
  Administered 2023-06-19: 1000 mg via INTRAVENOUS
  Filled 2023-06-19: qty 200

## 2023-06-19 MED ORDER — LACTATED RINGERS IV SOLN: INTRAVENOUS | Status: DC

## 2023-06-19 MED ORDER — DEXAMETHASONE SODIUM PHOSPHATE 10 MG/ML IJ SOLN
INTRAMUSCULAR | Status: AC
  Filled 2023-06-19: qty 1

## 2023-06-19 MED ORDER — CHLORHEXIDINE GLUCONATE 0.12 % MT SOLN
15.0000 mL | Freq: Once | OROMUCOSAL | Status: AC
  Administered 2023-06-19: 15 mL via OROMUCOSAL
  Filled 2023-06-19: qty 15

## 2023-06-19 MED ORDER — LIDOCAINE-EPINEPHRINE (PF) 1.5 %-1:200000 IJ SOLN
INTRAMUSCULAR | Status: DC | PRN
  Administered 2023-06-19: 10 mL via PERINEURAL

## 2023-06-19 MED ORDER — PHENYLEPHRINE HCL-NACL 20-0.9 MG/250ML-% IV SOLN
INTRAVENOUS | Status: DC | PRN
  Administered 2023-06-19: 15 ug/min via INTRAVENOUS

## 2023-06-19 MED ORDER — LIDOCAINE 2% (20 MG/ML) 5 ML SYRINGE
INTRAMUSCULAR | Status: DC | PRN
  Administered 2023-06-19: 2 mg via INTRAVENOUS

## 2023-06-19 MED ORDER — BUPIVACAINE-EPINEPHRINE (PF) 0.5% -1:200000 IJ SOLN
INTRAMUSCULAR | Status: DC | PRN
  Administered 2023-06-19: 25 mL via PERINEURAL

## 2023-06-19 MED ORDER — ROCURONIUM BROMIDE 10 MG/ML (PF) SYRINGE
PREFILLED_SYRINGE | INTRAVENOUS | Status: AC
  Filled 2023-06-19: qty 10

## 2023-06-19 MED ORDER — ACETAMINOPHEN 500 MG PO TABS: 1000.0000 mg | ORAL_TABLET | Freq: Once | ORAL | Status: DC | PRN

## 2023-06-19 MED ORDER — OXYCODONE HCL 5 MG/5ML PO SOLN: 5.0000 mg | Freq: Once | ORAL | Status: DC | PRN

## 2023-06-19 MED ORDER — FENTANYL CITRATE (PF) 100 MCG/2ML IJ SOLN: 100.0000 ug | Freq: Once | INTRAMUSCULAR | Status: AC

## 2023-06-19 MED ORDER — PROPOFOL 10 MG/ML IV BOLUS
INTRAVENOUS | Status: DC | PRN
  Administered 2023-06-19: 50 mg via INTRAVENOUS

## 2023-06-19 MED ORDER — PROPOFOL 500 MG/50ML IV EMUL
INTRAVENOUS | Status: DC | PRN
  Administered 2023-06-19: 100 ug/kg/min via INTRAVENOUS

## 2023-06-19 MED ORDER — 0.9 % SODIUM CHLORIDE (POUR BTL) OPTIME
TOPICAL | Status: DC | PRN
  Administered 2023-06-19: 1000 mL

## 2023-06-19 MED ORDER — DEXAMETHASONE SODIUM PHOSPHATE 10 MG/ML IJ SOLN
INTRAMUSCULAR | Status: DC | PRN
  Administered 2023-06-19: 10 mg via INTRAVENOUS

## 2023-06-19 MED ORDER — MIDAZOLAM HCL 2 MG/2ML IJ SOLN
INTRAMUSCULAR | Status: DC | PRN
  Administered 2023-06-19: 1 mg via INTRAVENOUS

## 2023-06-19 MED ORDER — PHENYLEPHRINE 80 MCG/ML (10ML) SYRINGE FOR IV PUSH (FOR BLOOD PRESSURE SUPPORT)
PREFILLED_SYRINGE | INTRAVENOUS | Status: DC | PRN
  Administered 2023-06-19 (×3): 80 ug via INTRAVENOUS

## 2023-06-19 MED ORDER — PHENYLEPHRINE 80 MCG/ML (10ML) SYRINGE FOR IV PUSH (FOR BLOOD PRESSURE SUPPORT)
PREFILLED_SYRINGE | INTRAVENOUS | Status: AC
  Filled 2023-06-19: qty 10

## 2023-06-19 MED ORDER — FENTANYL CITRATE (PF) 250 MCG/5ML IJ SOLN
INTRAMUSCULAR | Status: AC
  Filled 2023-06-19: qty 5

## 2023-06-19 SURGICAL SUPPLY — 43 items
BAG COUNTER SPONGE SURGICOUNT (BAG)
BAND RUBBER #18 3X1/16 STRL (MISCELLANEOUS) ×1
BIT DRILL 1.1 MINI QC NONSTRL (BIT) ×1
BLADE SURG 15 STRL LF DISP TIS (BLADE) ×1
BNDG COHESIVE 4X5 TAN STRL LF (GAUZE/BANDAGES/DRESSINGS) ×1
BNDG ESMARK 4X9 LF (GAUZE/BANDAGES/DRESSINGS)
BNDG GAUZE DERMACEA FLUFF 4 (GAUZE/BANDAGES/DRESSINGS) ×1
CHLORAPREP W/TINT 26 (MISCELLANEOUS) ×1
CORD BIPOLAR FORCEPS 12FT (ELECTRODE) ×1
COVER BACK TABLE 60X90IN (DRAPES) ×1
COVER MAYO STAND STRL (DRAPES) ×1
CUFF TOURN SGL QUICK 18X4 (TOURNIQUET CUFF) ×1
DRAPE C-ARM 42X72 X-RAY (DRAPES) ×1
DRAPE EXTREMITY T 121X128X90 (DISPOSABLE) ×1
DRAPE SURG 17X23 STRL (DRAPES) ×1
DRIVER BIT 1.5 (TRAUMA) ×2
DRSG EMULSION OIL 3X3 NADH (GAUZE/BANDAGES/DRESSINGS) ×1
GAUZE 4X4 16PLY ~~LOC~~+RFID DBL (SPONGE) ×1
GAUZE SPONGE 4X4 12PLY STRL LF (GAUZE/BANDAGES/DRESSINGS) ×1
GLOVE BIO SURGEON STRL SZ7.5 (GLOVE) ×1
GLOVE BIOGEL PI IND STRL 7.0 (GLOVE) ×1
GLOVE BIOGEL PI IND STRL 8 (GLOVE) ×1
GLOVE ECLIPSE 6.5 STRL STRAW (GLOVE) ×1
GOWN STRL REUS W/ TWL LRG LVL3 (GOWN DISPOSABLE) ×2
GOWN STRL REUS W/TWL XL LVL3 (GOWN DISPOSABLE) ×1
K-WIRE DBL TRONS .035X6 (WIRE) ×1
LOCK SCREW 1.5X15MM (Screw) ×1 IMPLANT
LOCK SCREW 1.5X9MM (Screw) ×2 IMPLANT
NS IRRIG 1000ML POUR BTL (IV SOLUTION) ×1
PACK BASIN DAY SURGERY FS (CUSTOM PROCEDURE TRAY) ×1
PAD CAST 4YDX4 CTTN HI CHSV (CAST SUPPLIES) ×1
PADDING CAST ABS COTTON 4X4 ST (CAST SUPPLIES)
PLATE T SMALL 1.5MM (Plate) ×1 IMPLANT
SCREW L 1.5X12 (Screw) ×2 IMPLANT
SCREW LOCKING 1.5X11MM (Screw) ×1 IMPLANT
SCREW LOCKING 1.5X8 (Screw) ×1 IMPLANT
SPLINT PLASTER CAST XFAST 3X15 (CAST SUPPLIES)
STOCKINETTE 6 STRL (DRAPES) ×1
SUT VIC AB 4-0 PS2 18 (SUTURE) ×2
SUT VICRYL RAPIDE 4-0 (SUTURE) ×1
TOWEL GREEN STERILE FF (TOWEL DISPOSABLE) ×1
TUBE CONNECTING 20X1/4 (TUBING) ×1
UNDERPAD 30X36 HEAVY ABSORB (UNDERPADS AND DIAPERS) ×1

## 2023-06-19 NOTE — Interval H&P Note (Signed)
 History and Physical Interval Note:  06/19/2023 1:20 PM  MAIMUNA LEAMAN  has presented today for surgery, with the diagnosis of RIGHT SMALL FINGER PROXIMAL PHALANX FRACTURE.  The various methods of treatment have been discussed with the patient and family. After consideration of risks, benefits and other options for treatment, the patient has consented to  Procedure(s): OPEN REDUCTION INTERNAL FIXATION (ORIF) OF RIGHT SMALL FINGER PROXIMAL PHALANX FRACTURE (Right) as a surgical intervention.  The patient's history has been reviewed, patient examined, no change in status, stable for surgery.  I have reviewed the patient's chart and labs.  Questions were answered to the patient's satisfaction.     Alm DELENA Hummer

## 2023-06-19 NOTE — Transfer of Care (Signed)
 Immediate Anesthesia Transfer of Care Note  Patient: [name]  Procedure(s) Performed: OPEN REDUCTION INTERNAL FIXATION (ORIF) OF RIGHT SMALL FINGER PROXIMAL PHALANX FRACTURE (Right: Hand)  Patient Location: PACU  Anesthesia Type:MAC and Regional  Level of Consciousness: awake, alert , oriented, and patient cooperative  Airway & Oxygen  Therapy: Patient Spontanous Breathing and Patient connected to nasal cannula oxygen   Post-op Assessment: Report given to RN and Post -op Vital signs reviewed and stable  Post vital signs: Reviewed and stable  Last Vitals:  Vitals Value Taken Time  BP 111/62 06/19/23 1440  Temp    Pulse 68 06/19/23 1442  Resp 17 06/19/23 1442  SpO2 93 % 06/19/23 1442  Vitals shown include unfiled device data.  Last Pain:  Vitals:   06/19/23 1108  TempSrc:   PainSc: 8       Patients Stated Pain Goal: 0 (06/19/23 1108)  Complications: No notable events documented.

## 2023-06-19 NOTE — Anesthesia Procedure Notes (Addendum)
 Anesthesia Regional Block: Axillary brachial plexus block   Pre-Anesthetic Checklist: , timeout performed,  Correct Patient, Correct Site, Correct Laterality,  Correct Procedure, Correct Position, site marked,  Risks and benefits discussed,  Surgical consent,  Pre-op evaluation,  At surgeon's request and post-op pain management  Laterality: Right and Upper  Prep: chloraprep       Needles:  Injection technique: Single-shot      Needle Length: 9cm  Needle Gauge: 22     Additional Needles: Arrow StimuQuik ECHO Echogenic Stimulating PNB Needle  Procedures:,,,, ultrasound used (permanent image in chart),,    Narrative:  Start time: 06/19/2023 12:41 PM End time: 06/19/2023 12:51 PM Injection made incrementally with aspirations every 5 mL.  Performed by: Personally  Anesthesiologist: Leopoldo Bruckner, MD

## 2023-06-19 NOTE — Op Note (Signed)
 06/19/2023  1:20 PM  PATIENT:  [name]  53 y.o. female  PRE-OPERATIVE DIAGNOSIS: Displaced closed fracture of right small finger proximal phalanx  POST-OPERATIVE DIAGNOSIS:  Same  PROCEDURE:  ORIF R SF P1 fx   SURGEON: Alm LABOR. Sebastian, MD  PHYSICIAN ASSISTANT: Abigail Kuster, OPA-C  ANESTHESIA: Regional/MAC  SPECIMENS:  None  DRAINS: None  EBL: Less than 10 mL  PREOPERATIVE INDICATIONS:  SHAWNDA [name] is a  53 y.o. female with a displaced comminuted intra-articular base fracture of the right small finger proximal phalanx with digital malangulation and rotation.  The risks benefits and alternatives were discussed with the patient preoperatively including but not limited to the risks of infection, bleeding, nerve injury, cardiopulmonary complications, the need for revision surgery, among others, and the patient verbalized understanding and consented to proceed.  OPERATIVE IMPLANTS: Biomet ALPS handset small T plate and screws  OPERATIVE PROCEDURE: After receiving prophylactic antibiotics and a regional block, the patient was escorted to the operative theatre and placed in a supine position.   A surgical "time-out" was performed during which the planned procedure, proposed operative site, and the correct patient identity were compared to the operative consent and agreement confirmed by the circulating nurse according to current facility policy. Following application of a tourniquet to the operative extremity, the exposed skin was pre-scrubbed with Hibiclens  scrub brush and then was prepped with Chloraprep and draped in the usual sterile fashion. The limb was exsanguinated with an Esmarch bandage and the tourniquet inflated to approximately higher than systolic BP.   A mostly linear dorsal approach to the right small finger was made, curving obliquely ulnarly at the proximal end in line with the skin creases.  Full-thickness flaps were elevated.  The extensor apparatus  was split down the midline and reflected radially and ulnarly.  The periosteum was reflected similarly.  The fracture was identified and provisionally reduced.  A small T plate was applied to the dorsal surface with the fracture held in a reduced position and provisionally applied with K wire fixation.  Fluoroscopic evaluation revealed good radiographic alignment and clinical alignment was deemed good as well.  The distal holes of the T were straightened some to make a straighter tear rather than a subtle Y like the plate comes, prior to applying it to the phalanx.  Holes were sequentially drilled and filled with appropriate depth checked fluoroscopically.  All of the screws were locking screws.  The hardware was judged to be extra-articular.  Clinical alignment and radiographic alignment remained good.  Final images were obtained and saved.  The wound was irrigated and the periosteum reapproximated with 4-0 Vicryl Rapide suture.  The extensor apparatus was reapproximated with a 4-0 Vicryl running suture.  The tourniquet was deflated the wound again irrigated and additional hemostasis obtained with bipolar electrocautery.  The skin was then closed with 4-0 Vicryl Rapide running horizontal mattress suture.   A bulky dressing with both volar and dorsal plaster components was applied, placing the digits in an intrinsic plus position, and the patient was taken to the recovery room in stable condition.  DISPOSITION: The patient will be discharged home today with typical post-op instructions, hopefully to see [facility] hand therapy to have a custom splint fabricated and begin rehab, to include active and passive range of motion exercises, before returning to see us  in 10-15 days for reevaluation with new x-rays of the right small finger out of the splint.

## 2023-06-19 NOTE — Discharge Instructions (Signed)
 Discharge Instructions   You have a dressing with a plaster splint incorporated in it. Move your fingers as much as possible, making a full fist and fully opening the fist. Elevate your hand to reduce pain & swelling of the digits.  Ice over the operative site may be helpful to reduce pain & swelling.  DO NOT USE HEAT. Take Tylenol  650 mg every 6 hours. Take the prescribed pain medicine additionally for severe post operative pain.  Leave the dressing in place until you return to our office.  You may shower, but keep the bandage clean & dry.  You may drive a car when you are off of prescription pain medications and can safely control your vehicle with both hands. Our office will call you to arrange follow-up   Please call [phone] during normal business hours or [phone] after hours for any problems. Including the following:  - excessive redness of the incisions - drainage for more than 4 days - fever of more than 101.5 F  *Please note that pain medications will not be refilled after hours or on weekends.

## 2023-06-20 ENCOUNTER — Encounter (HOSPITAL_COMMUNITY): Payer: Self-pay | Admitting: Orthopedic Surgery

## 2023-06-20 ENCOUNTER — Other Ambulatory Visit: Payer: Self-pay | Admitting: Internal Medicine

## 2023-06-20 DIAGNOSIS — F411 Generalized anxiety disorder: Secondary | ICD-10-CM

## 2023-06-21 ENCOUNTER — Ambulatory Visit: Payer: Medicaid Other | Admitting: Internal Medicine

## 2023-06-21 NOTE — Anesthesia Postprocedure Evaluation (Signed)
 Anesthesia Post Note  Patient: [name]  Procedure(s) Performed: OPEN REDUCTION INTERNAL FIXATION (ORIF) OF RIGHT SMALL FINGER PROXIMAL PHALANX FRACTURE (Right: Hand)     Patient location during evaluation: PACU Anesthesia Type: MAC and Regional Level of consciousness: awake and alert Pain management: pain level controlled Vital Signs Assessment: post-procedure vital signs reviewed and stable Respiratory status: spontaneous breathing, nonlabored ventilation and respiratory function stable Cardiovascular status: stable and blood pressure returned to baseline Postop Assessment: no apparent nausea or vomiting Anesthetic complications: no   No notable events documented.  Last Vitals:  Vitals:   06/19/23 1445 06/19/23 1455  BP:  103/67  Pulse: 69 69  Resp: (!) 26 11  Temp:  (!) 36.1 C  SpO2: 93% 93%    Last Pain:  Vitals:   06/19/23 1455  TempSrc:   PainSc: 0-No pain                 Kyira Volkert

## 2023-06-22 ENCOUNTER — Emergency Department (HOSPITAL_COMMUNITY)
Admission: EM | Admit: 2023-06-22 | Discharge: 2023-06-22 | Disposition: A | Discharge status: Home / Self Care | Payer: Medicaid Other | Attending: Emergency Medicine | Admitting: Emergency Medicine

## 2023-06-22 ENCOUNTER — Emergency Department (HOSPITAL_COMMUNITY): Payer: Medicaid Other

## 2023-06-22 DIAGNOSIS — J069 Acute upper respiratory infection, unspecified: Secondary | ICD-10-CM | POA: Diagnosis not present

## 2023-06-22 DIAGNOSIS — J449 Chronic obstructive pulmonary disease, unspecified: Secondary | ICD-10-CM | POA: Insufficient documentation

## 2023-06-22 DIAGNOSIS — Z7951 Long term (current) use of inhaled steroids: Secondary | ICD-10-CM | POA: Insufficient documentation

## 2023-06-22 DIAGNOSIS — I110 Hypertensive heart disease with heart failure: Secondary | ICD-10-CM | POA: Insufficient documentation

## 2023-06-22 DIAGNOSIS — R059 Cough, unspecified: Secondary | ICD-10-CM | POA: Diagnosis present

## 2023-06-22 DIAGNOSIS — Z79899 Other long term (current) drug therapy: Secondary | ICD-10-CM | POA: Insufficient documentation

## 2023-06-22 DIAGNOSIS — Z4800 Encounter for change or removal of nonsurgical wound dressing: Secondary | ICD-10-CM | POA: Insufficient documentation

## 2023-06-22 DIAGNOSIS — Z5189 Encounter for other specified aftercare: Secondary | ICD-10-CM

## 2023-06-22 DIAGNOSIS — R079 Chest pain, unspecified: Secondary | ICD-10-CM | POA: Diagnosis not present

## 2023-06-22 DIAGNOSIS — Z20822 Contact with and (suspected) exposure to covid-19: Secondary | ICD-10-CM | POA: Diagnosis not present

## 2023-06-22 DIAGNOSIS — R0602 Shortness of breath: Secondary | ICD-10-CM | POA: Diagnosis not present

## 2023-06-22 DIAGNOSIS — I509 Heart failure, unspecified: Secondary | ICD-10-CM | POA: Insufficient documentation

## 2023-06-22 DIAGNOSIS — B9789 Other viral agents as the cause of diseases classified elsewhere: Secondary | ICD-10-CM | POA: Diagnosis not present

## 2023-06-22 LAB — RESP PANEL BY RT-PCR (RSV, FLU A&B, COVID)  RVPGX2
Influenza A by PCR: NEGATIVE
Influenza B by PCR: NEGATIVE
Resp Syncytial Virus by PCR: NEGATIVE
SARS Coronavirus 2 by RT PCR: NEGATIVE

## 2023-06-22 LAB — BASIC METABOLIC PANEL
Anion gap: 10 (ref 5–15)
BUN: 6 mg/dL (ref 6–20)
CO2: 25 mmol/L (ref 22–32)
Calcium: 8.2 mg/dL — ABNORMAL LOW (ref 8.9–10.3)
Chloride: 104 mmol/L (ref 98–111)
Creatinine, Ser: 0.99 mg/dL (ref 0.44–1.00)
GFR, Estimated: >60 mL/min (ref >60)
Glucose, Bld: 83 mg/dL (ref 70–99)
Potassium: 3.5 mmol/L (ref 3.5–5.1)
Sodium: 139 mmol/L (ref 135–145)

## 2023-06-22 LAB — CBC
HCT: 35.5 % — ABNORMAL LOW (ref 36.0–46.0)
Hemoglobin: 11.4 g/dL — ABNORMAL LOW (ref 12.0–15.0)
MCH: 30.2 pg (ref 26.0–34.0)
MCHC: 32.1 g/dL (ref 30.0–36.0)
MCV: 94.2 fL (ref 80.0–100.0)
Platelets: 306 10*3/uL (ref 150–400)
RBC: 3.77 MIL/uL — ABNORMAL LOW (ref 3.87–5.11)
RDW: 16.3 % — ABNORMAL HIGH (ref 11.5–15.5)
WBC: 7 10*3/uL (ref 4.0–10.5)
nRBC: 0 % (ref 0.0–0.2)

## 2023-06-22 LAB — TROPONIN I (HIGH SENSITIVITY)
Troponin I (High Sensitivity): 2 ng/L (ref <18)
Troponin I (High Sensitivity): 2 ng/L (ref <18)

## 2023-06-22 MED ORDER — IPRATROPIUM-ALBUTEROL 0.5-2.5 (3) MG/3ML IN SOLN
3.0000 mL | Freq: Once | RESPIRATORY_TRACT | Status: AC
  Administered 2023-06-22: 3 mL via RESPIRATORY_TRACT
  Filled 2023-06-22: qty 3

## 2023-06-22 MED ORDER — ONDANSETRON 4 MG PO TBDP
4.0000 mg | ORAL_TABLET | Freq: Once | ORAL | Status: AC
  Administered 2023-06-22: 4 mg via ORAL
  Filled 2023-06-22: qty 1

## 2023-06-22 MED ORDER — TRAMADOL HCL 50 MG PO TABS
50.0000 mg | ORAL_TABLET | Freq: Once | ORAL | Status: AC
  Administered 2023-06-22: 50 mg via ORAL
  Filled 2023-06-22: qty 1

## 2023-06-22 MED ORDER — ACETAMINOPHEN 500 MG PO TABS
1000.0000 mg | ORAL_TABLET | Freq: Once | ORAL | Status: AC
  Administered 2023-06-22: 1000 mg via ORAL
  Filled 2023-06-22: qty 2

## 2023-06-22 NOTE — ED Provider Triage Note (Signed)
 Emergency Medicine Provider Triage Evaluation Note  [name] , a 53 y.o. female  was evaluated in triage.  Pt complains of 3 days post op, would like dressing changes, denies any significant discharge/ pain. Complains of chest pain, cough, muscle aches, nausea and head ache starting 1 day ago.   Review of Systems  Positive: Chest pain Negative: fever  Physical Exam  BP (!) 134/106 (BP Location: Left Arm)   Pulse 74   Temp 98.3 F (36.8 C) (Oral)   Resp 18   SpO2 96%  Gen:   Awake, no distress   Resp:  Normal effort  MSK:   Moves extremities without difficulty  Other:  Right hand in splint   Medical Decision Making  Medically screening exam initiated at 1:48 PM.  Appropriate orders placed.  [name] was informed that the remainder of the evaluation will be completed by another provider, this initial triage assessment does not replace that evaluation, and the importance of remaining in the ED until their evaluation is complete.     Shermon Warren SAILOR, PA-C 06/22/23 1349

## 2023-06-22 NOTE — Discharge Instructions (Addendum)
 You were seen in the emergency room today for URI-like symptoms.  Take zofran  as needed for nausea. I would recommend alternating Tylenol  and ibuprofen for muscle aches or pain.  Make sure to keep albuterol  inhaler on you and use as needed for wheezing or shortness of breath.  Return to emergency room if you have any new or worsening symptoms.  Please follow-up with orthopedic surgeon for further recommendations on incision care.  I recommend calling them on Monday if you have any further questions or feel that you need to be seen sooner.  Return to emergency room if you have any new or worsening symptoms.

## 2023-06-22 NOTE — ED Triage Notes (Signed)
 Pt states that she recently had surgery for R wrist fx and was told that she needed her bandage changed in 24-48 hours post op but did not receive any information on getting the dressing changed. Pt states that approximately two days ago she began having URI symptoms including cough, congestion, chills, myalgias. Pt reports using one breathing tx and using supplemental O2 at home PTA.

## 2023-06-22 NOTE — ED Provider Notes (Signed)
 [place] EMERGENCY DEPARTMENT AT [facility] Provider Note   CSN: [id number] Arrival date & time: 06/22/23  1040     History  Chief Complaint  Patient presents with   URI   Wound Check    DOREAN HIEBERT is a 53 y.o. female past medical history of hypertension, congestive heart failure, substance abuse, GERD, COPD, oxygen  dependent presenting to emergency room 3 days postop right ORIF of proximal phalanx.  Patient reports she is supposed to have dressing changes every 1 to 2 days but she has not had a dressing change yet.  Reports no complications related to this she is just unsure how to do it and requesting that her dressing be changed.  Also reports 2 days of congestion, cough, shortness of breath.  Patient has history of COPD.  She used inhaler once at home which improved symptoms.  She does have some mild shortness of breath right now requesting another breathing treatment.  Reports chest tightness, which feels like her COPD. Denies fevers at home, abdominal pain, leg swelling.    URI Wound Check Associated symptoms include shortness of breath.       Home Medications Prior to Admission medications   Medication Sig Start Date End Date Taking? Authorizing Provider  albuterol  (VENTOLIN  HFA) 108 (90 Base) MCG/ACT inhaler Inhale 2 puffs into the lungs every 4 (four) hours as needed for shortness of breath or wheezing (COPD). 04/30/22   [provider]  atorvastatin  (LIPITOR) 40 MG tablet Take 1 tablet (40 mg total) by mouth daily. 11/19/22 06/19/23  Miriam Norris, NP  Buprenorphine HCl-Naloxone HCl 8-2 MG FILM Place 1 Film under the tongue 2 (two) times daily. 04/02/23   [provider]  dicyclomine  (BENTYL ) 20 MG tablet TAKE ONE TABLET BY MOUTH FOUR TIMES DAILY BEFORE MEALS AND AT BEDTIME. TAKE ONE TABLET THREE TIMES DAILY WITH FOOD. HOLD FOR CONSTIPATION. 05/16/23   Rudy Josette RAMAN, PA-C  famotidine  (PEPCID ) 20 MG tablet Take 20 mg by mouth daily as needed  (For break through heart burn).    [provider]  FLUoxetine  (PROZAC ) 40 MG capsule Take 2 capsules (80 mg total) by mouth daily. 06/14/23 12/11/23  Melvenia Manus BRAVO, MD  furosemide  (LASIX ) 40 MG tablet Take 1 tablet (40 mg total) by mouth daily as needed (swelling). 11/12/22   Miriam Norris, NP  hydrOXYzine  (ATARAX ) 50 MG tablet TAKE ONE TABLET BY MOUTH UP TO THREE TIMES DAILY AS NEEDED FOR ANXIETY 06/20/23   Melvenia Manus BRAVO, MD  ipratropium-albuterol  (DUONEB) 0.5-2.5 (3) MG/3ML SOLN Take 3 mLs by nebulization every 4 (four) hours as needed (SOB, wheezing, coughing). 09/09/22   Johnson, Clanford L, MD  metoprolol  succinate (TOPROL -XL) 100 MG 24 hr tablet Take 1 tablet (100 mg total) by mouth daily. Take with or immediately following a meal. 03/28/23   Mallipeddi, Vishnu P, MD  mirtazapine  (REMERON ) 30 MG tablet Take 1 tablet (30 mg total) by mouth at bedtime. 06/14/23   Melvenia Manus BRAVO, MD  Nebulizer System All-In-One MISC 1 Device by Does not apply route as directed. 09/09/22   Johnson, Clanford L, MD  nicotine  (NICOTINE  STEP 2) 14 mg/24hr patch Place 1 patch (14 mg total) onto the skin daily. 06/14/23   Melvenia Manus BRAVO, MD  nitroGLYCERIN  (NITROSTAT ) 0.4 MG SL tablet Place 1 tablet (0.4 mg total) under the tongue every 5 (five) minutes as needed for chest pain. Patient taking differently: Place 0.4 mg under the tongue every 5 (five) minutes x 3  doses as needed for chest pain (if no relief after 3rd dose, proceed to ED or call 911). 09/09/22   Johnson, Clanford L, MD  ondansetron  (ZOFRAN -ODT) 8 MG disintegrating tablet TAKE ONE TABLET BY MOUTH EVERY 8 HOURS Patient taking differently: Take 8 mg by mouth every 8 (eight) hours as needed for nausea or vomiting. 05/30/23   Rudy Josette RAMAN, PA-C  oxyCODONE  (ROXICODONE ) 5 MG immediate release tablet Take 1 tablet (5 mg total) by mouth every 6 (six) hours as needed for severe pain (pain score 7-10). 06/19/23   Sebastian Lenis, MD  OXYGEN  Inhale 2 L into  the lungs at bedtime.    [provider]  pantoprazole  (PROTONIX ) 40 MG tablet TAKE (1) TABLET BY MOUTH TWICE A DAY BEFORE MEALS. (BREAKFAST AND SUPPER) 11/30/22   Rudy Josette RAMAN, PA-C  potassium chloride  SA (KLOR-CON  M) 20 MEQ tablet Take 1 tablet (20 mEq total) by mouth daily as needed. Patient taking differently: Take 20 mEq by mouth daily as needed (Use with Furosemide ). 11/12/22   Miriam Norris, NP  promethazine  (PHENERGAN ) 25 MG tablet TAKE ONE TABLET BY MOUTH EVERY 6 HOURS AS NEEDED FOR NAUSEA OR VOMITING. Patient taking differently: Take 25 mg by mouth every 8 (eight) hours as needed for nausea or vomiting. 06/06/23   Rudy Josette RAMAN, PA-C  sacubitril -valsartan  (ENTRESTO ) 24-26 MG TAKE ONE TABLET BY MOUTH TWICE DAILY 05/13/23   Mallipeddi, Vishnu P, MD  spironolactone  (ALDACTONE ) 25 MG tablet Take 0.5 tablets (12.5 mg total) by mouth daily. 11/12/22   Miriam Norris, NP  SYMBICORT  160-4.5 MCG/ACT inhaler Inhale 2 puffs into the lungs 2 (two) times daily. 04/04/23   [provider]      Allergies    Buprenorphine, Penicillins, Aripiprazole, Seroquel [quetiapine], Cyclobenzaprine , Duloxetine hcl, Cefaclor, Doxycycline , Erythromycin, Gabapentin, Ibuprofen, Levofloxacin , Lisinopril, Macrobid [nitrofurantoin monohyd macro], Naproxen, and Pregabalin    Review of Systems   Review of Systems  Respiratory:  Positive for chest tightness and shortness of breath.     Physical Exam Updated Vital Signs BP (!) 134/106 (BP Location: Left Arm)   Pulse 74   Temp 98.3 F (36.8 C) (Oral)   Resp 18   SpO2 96%  Physical Exam Vitals and nursing note reviewed.  Constitutional:      General: She is not in acute distress.    Appearance: She is not toxic-appearing.  HENT:     Head: Normocephalic and atraumatic.  Eyes:     General: No scleral icterus.    Conjunctiva/sclera: Conjunctivae normal.  Cardiovascular:     Rate and Rhythm: Normal rate and regular rhythm.     Pulses:  Normal pulses.     Heart sounds: Normal heart sounds.  Pulmonary:     Effort: Pulmonary effort is normal. No respiratory distress.     Breath sounds: Normal breath sounds. No wheezing.  Chest:     Chest wall: No tenderness.  Abdominal:     General: Abdomen is flat. Bowel sounds are normal.     Palpations: Abdomen is soft.     Tenderness: There is no abdominal tenderness.  Musculoskeletal:     Right lower leg: No edema.     Left lower leg: No edema.     Comments: Incision is clean dry intact.  Skin:    General: Skin is warm and dry.     Findings: No lesion.  Neurological:     General: No focal deficit present.     Mental Status: She is alert and  oriented to person, place, and time. Mental status is at baseline.     ED Results / Procedures / Treatments   Labs (all labs ordered are listed, but only abnormal results are displayed) Labs Reviewed  BASIC METABOLIC PANEL - Abnormal; Notable for the following components:      Result Value   Calcium  8.2 (*)    All other components within normal limits  CBC - Abnormal; Notable for the following components:   RBC 3.77 (*)    Hemoglobin 11.4 (*)    HCT 35.5 (*)    RDW 16.3 (*)    All other components within normal limits  RESP PANEL BY RT-PCR (RSV, FLU A&B, COVID)  RVPGX2  TROPONIN I (HIGH SENSITIVITY)  TROPONIN I (HIGH SENSITIVITY)    EKG None  Radiology DG Chest 2 View Result Date: 06/22/2023 CLINICAL DATA:  Chest pain and shortness of breath. EXAM: CHEST - 2 VIEW COMPARISON:  02/21/2023 FINDINGS: The heart size and mediastinal contours are within normal limits. Stable mild pulmonary interstitial prominence. No evidence of acute infiltrate or pleural effusion. The visualized skeletal structures are unremarkable. IMPRESSION: No active cardiopulmonary disease. Electronically Signed   By: Norleen DELENA Kil M.D.   On: 06/22/2023 11:55    Procedures Procedures    Medications Ordered in ED Medications  ondansetron  (ZOFRAN -ODT)  disintegrating tablet 4 mg (4 mg Oral Given 06/22/23 1201)  acetaminophen  (TYLENOL ) tablet 1,000 mg (1,000 mg Oral Given 06/22/23 1404)  ondansetron  (ZOFRAN -ODT) disintegrating tablet 4 mg (4 mg Oral Given 06/22/23 1403)    ED Course/ Medical Decision Making/ A&P                                 Medical Decision Making Amount and/or Complexity of Data Reviewed Labs: ordered. Radiology: ordered.  Risk OTC drugs. Prescription drug management.   Kenneth JINNY Burnet 53 y.o. presented today for URI like symptoms. Working DDx that I considered at this time includes, but not limited to, viral illness, pharyngitis, mono, sinusitis, electrolyte abnormality, AOM.  R/o DDx: these additional diagnoses are not consistent with patient's history, presentation, physical exam, labs/imaging findings.  Review of prior external notes: 06/19/23  Labs:  Respiratory Panel: Negative CBC without leukocytosis, hemoglobin is 11.4.  BMP without significant electrolyte abnormality.  Respiratory panel negative.  Troponin and repeat troponin within normal limits.  EKG without any ST elevation.   Imaging:  Chest x-ray due to cough, without acute findings.   Problem List / ED Course / Critical interventions / Medication management  Patient reporting to emergency room with multiple complaints.  Patient reports she has had 2 days of URI-like symptoms.  She has had a dry cough as well.  Denies productive cough.  She has noted some chest tightness associated with shortness of breath.  Shortness of breath was improved at home with breathing treatment.  Patient would like to try DuoNeb while here.  She has no wheezing, lungs clear to auscultation bilaterally.  No sign of respiratory distress, no hypoxia.  Patient's symptoms consistent with upper respiratory tract infection.  She has at home albuterol  inhaler.  She also has close follow-up.  Given strict return precautions. Patient also 3 days postop right ORIF.  Cast is slightly  loose fitting.  Incision clearly visualized with incision clean dry intact.  No sign of cellulitis or drainage.  Splint was rewrapped.  Patient is neurovascularly intact.  Given the patient is well-appearing will discharge  home with outpatient follow-up.  Discussed using orthopedics recommendation for pain management given opioid misuse in the past.  Patient will call surgeon on Monday and agrees to plan. I ordered medication including DuoNeb for shortness of breath Reevaluation of the patient after these medicines showed that the patient improved Patients vitals assessed. Upon arrival patient is hemodynamically stable.  I have reviewed the patients home medicines and have made adjustments as needed     Plan:  F/u w/ PCP in 2-3d to ensure resolution of sx.  Patient was given return precautions. Patient stable for discharge at this time.  Patient educated on sx and dx and verbalized understanding of plan. Return to ER if new or worsening sx.          Final Clinical Impression(s) / ED Diagnoses Final diagnoses:  Viral upper respiratory tract infection  Visit for wound check    Rx / DC Orders ED Discharge Orders     None         Shermon Warren SAILOR, PA-C 06/22/23 2031    Franklyn Sid SAILOR, MD 06/24/23 [phone]

## 2023-06-24 ENCOUNTER — Ambulatory Visit: Payer: Medicaid Other | Admitting: Internal Medicine

## 2023-06-26 ENCOUNTER — Encounter (HOSPITAL_COMMUNITY): Payer: Medicaid Other | Admitting: Occupational Therapy

## 2023-06-27 ENCOUNTER — Ambulatory Visit: Payer: Medicaid Other | Admitting: Urology

## 2023-06-27 ENCOUNTER — Telehealth: Payer: Self-pay | Admitting: Internal Medicine

## 2023-06-27 ENCOUNTER — Other Ambulatory Visit: Payer: Self-pay | Admitting: Urology

## 2023-06-27 DIAGNOSIS — N390 Urinary tract infection, site not specified: Secondary | ICD-10-CM

## 2023-06-27 NOTE — Telephone Encounter (Signed)
Spoke with patient regarding the rescheduled PFT appointment on 08/06/23 t 3:00 pm at [facility] up with Dr. Sherene Sires rescheduled from 07/09/23 to 08/06/23 at 4::00 pm--will mail information to patient and she voiced her understanding

## 2023-06-27 NOTE — Progress Notes (Deleted)
 Subjective: 1. Urinary tract infection without hematuria, site unspecified      Consult requested by Dr. Celso Amy.  1/23/25[name] returns today in f/u.  She has a Bosniak 20F cyst of the LUP that was last imaged with MRI on 02/03/23.  I was stable at 3.6cm and a repeat MRI in a year was recommended.   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  She has had prior MRI's and the cyst has been stable since at least 2020 but has enlarged since 2014.  She has some pain intermittently in the left  flank for a few years.  She has had no recent hematuria.  She had left pyelonephritis with e. Coli but a.  treated in April.  Her Cr was 0.57 in 4/24.  She also had sepsis and pneumonia when she was admitted in April.  She has CHF and is on a diuretic so she has frequency.  Her UA has >30 WBC, many bacteria and nit +.  She has no dysuria.  She has Crohn's disease with recurrent colitis but no fecaluria or pneumoturia.  He has an abdominal MRI ordered by Dr. Wolfgang Phoenix.  ROS:  Review of Systems  Cardiovascular:  Positive for chest pain and leg swelling.  Gastrointestinal:  Positive for constipation, diarrhea, heartburn and vomiting.  Musculoskeletal:  Positive for back pain and joint pain.  Neurological:  Positive for weakness.  Psychiatric/Behavioral:  Positive for depression.     Allergies  Allergen Reactions   Buprenorphine Other (See Comments)    Chest pain/ringing in ears and feet swelling   Penicillins Swelling and Rash    All cillins    Aripiprazole Other (See Comments)    seizure   Seroquel [Quetiapine] Other (See Comments)    Seizure (08/18/20)   Cyclobenzaprine     Other Reaction(s): dizziness   Duloxetine Hcl     Other Reaction(s): Other (see comments)   Cefaclor Other (See Comments)    Muscles locked up   Doxycycline Rash   Erythromycin Rash   Gabapentin Nausea And Vomiting    dizzy   Ibuprofen  Swelling and Rash   Levofloxacin Rash    Itching   Lisinopril Nausea And Vomiting   Macrobid [Nitrofurantoin Monohyd Macro] Rash   Naproxen Swelling and Rash   Pregabalin Rash    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   CHF (congestive heart failure) (HCC)    Chronic abdominal pain    Chronic back pain    Chronic kidney disease, stage 1 07/04/2022   Colitis    Constipation    COPD (chronic obstructive pulmonary disease) (HCC) 04/04/2023   Depression 03/14/2023   Dyspnea    with exertion   GERD (gastroesophageal reflux disease)    Gram-negative bacteremia 09/03/2022   History of blood transfusion 08/2022   2 units transfused at [facility]   Hypertension    Insomnia    Kidney cysts    left kidney   MDD (major depressive disorder)    Migraine    Nonischemic cardiomyopathy (HCC) 06/14/2023   Oxygen dependent    2L via [place] prn 24/7   Plumbism    blood clot    Pneumonia    x several  PONV (postoperative nausea and vomiting)    PTSD (post-traumatic stress disorder)    Pyelonephritis 09/02/2022   Renal mass    Sepsis due to Escherichia coli (E. 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   Uterine cancer [facility])    age 81    Past Surgical History:  Procedure Laterality Date   ABDOMINAL HYSTERECTOMY     BACK SURGERY     BIOPSY  07/07/2021   Procedure: BIOPSY;  Surgeon: Lanelle Bal, DO;  Location: [facility] ENDO SUITE;  Service: Endoscopy;;   CHOLECYSTECTOMY     Complicated by bile leak requiring ERCP with temporary stenting   COLONOSCOPY WITH ESOPHAGOGASTRODUODENOSCOPY (EGD)  07/2009   [facility]: Op notes cannot be seen through care everywhere however pathology showed terminal ileum and random colon biopsies normal.  Stomach biopsy with gastric antral and fundic mucosa  with reactive foveolar hyperplasia.  No active gastritis.  Stains negative for H. pylori.   COLONOSCOPY WITH PROPOFOL N/A 07/07/2021   Surgeon: Earnest Bailey K, DO;  Non-bleeding internal hemorrhoids, normal TI  biopsied, normal colon biopsied. Pathology with colon and TI biopsies normal.   ESOPHAGEAL BRUSHING  07/07/2021   Procedure: ESOPHAGEAL BRUSHING;  Surgeon: Lanelle Bal, DO;  Location: [facility] END[facility] SUITE;  Service: Endoscopy;;   ESOPHAGOGASTRODUODENOSCOPY  06/2007   [facility]: Normal esophagus, gastric mucosal abnormality with erythema, biopsy showed gastric antral and fundic mucosa with reactive foveolar hyperplasia, no gastritis, no H. pylori.   ESOPHAGOGASTRODUODENOSCOPY (EGD) WITH PROPOFOL N/A 07/07/2021   Surgeon: Lanelle Bal, DO; moderately severe Candida esophagitis, gastritis, biopsies negative for H. pylori, normal examined duodenum.  Prescribed Diflucan and recommended PPI twice daily.   HAND SURGERY     OPEN REDUCTION INTERNAL FIXATION (ORIF) PROXIMAL PHALANX Right 06/19/2023   Procedure: OPEN REDUCTION INTERNAL FIXATION (ORIF) OF RIGHT SMALL FINGER PROXIMAL PHALANX FRACTURE;  Surgeon: Mack Hook, MD;  Location: [facility] OR;  Service: Orthopedics;  Laterality: Right;    Social History   Socioeconomic History   Marital status: Married    Spouse name: benito   Number of children: 2   Years of education: Not on file   Highest education level: Associate degree: academic program  Occupational History    Comment: not employed  Tobacco Use   Smoking status: Every Day    Current packs/day: 1.00    Average packs/day: 1 pack/day for 20.0 years (20.0 ttl pk-yrs)    Types: Cigarettes, E-cigarettes   Smokeless tobacco: Never   Tobacco comments:    Half a pack per day and utilizing nicotine patches as of 03/14/2023  Vaping Use   Vaping status: Never Used  Substance and Sexual Activity   Alcohol use: Not Currently    Comment: Quit in 2021.  See psychiatry note from  03/14/2023   Drug use: Yes    Types: Benzodiazepines, Hydrocodone    Comment: See psychiatry note from 04/09/23   Sexual activity: Yes    Birth control/protection: Surgical    Comment: Hysterectomy  Other Topics Concern   Not on file  Social History Narrative   Not on file   Social Drivers of Health   Financial Resource Strain: High Risk (05/06/2023)   Overall Financial Resource Strain (CARDIA)    Difficulty of Paying Living Expenses: Very hard  Food Insecurity: Food Insecurity Present (05/06/2023)   Hunger Vital Sign    Worried About Running Out of  Food in the Last Year: Often true    Ran Out of Food in the Last Year: Sometimes true  Transportation Needs: No Transportation Needs (05/06/2023)   PRAPARE - Administrator, Civil Service (Medical): No    Lack of Transportation (Non-Medical): No  Physical Activity: Unknown (05/06/2023)   Exercise Vital Sign    Days of Exercise per Week: 0 days    Minutes of Exercise per Session: Not on file  Stress: Stress Concern Present (05/06/2023)   [employer] of Occupational Health - Occupational Stress Questionnaire    Feeling of Stress : Very much  Social Connections: Unknown (05/06/2023)   Social Connection and Isolation Panel [NHANES]    Frequency of Communication with Friends and Family: Patient declined    Frequency of Social Gatherings with Friends and Family: Never    Attends Religious Services: Never    Database administrator or Organizations: No    Attends Engineer, structural: Not on file    Marital Status: Married  Catering manager Violence: Not At Risk (03/19/2017)   Humiliation, Afraid, Rape, and Kick questionnaire    Fear of Current or Ex-Partner: No    Emotionally Abused: No    Physically Abused: No    Sexually Abused: No    Family History  Adopted: Yes  Problem Relation Age of Onset   Colon cancer Maternal Grandfather        21s   Colon cancer Maternal Uncle        90s   Cancer Other     Seizures Other    Stroke Other    Diabetes Other     Anti-infectives: Anti-infectives (From admission, onward)    None       Current Outpatient Medications  Medication Sig Dispense Refill   albuterol (VENTOLIN HFA) 108 (90 Base) MCG/ACT inhaler Inhale 2 puffs into the lungs every 4 (four) hours as needed for shortness of breath or wheezing (COPD).     atorvastatin (LIPITOR) 40 MG tablet Take 1 tablet (40 mg total) by mouth daily. 90 tablet 3   Buprenorphine HCl-Naloxone HCl 8-2 MG FILM Place 1 Film under the tongue 2 (two) times daily.     dicyclomine (BENTYL) 20 MG tablet TAKE ONE TABLET BY MOUTH FOUR TIMES DAILY BEFORE MEALS AND AT BEDTIME. TAKE ONE TABLET THREE TIMES DAILY WITH FOOD. HOLD FOR CONSTIPATION. 120 tablet 3   famotidine (PEPCID) 20 MG tablet Take 20 mg by mouth daily as needed (For break through heart burn).     FLUoxetine (PROZAC) 40 MG capsule Take 2 capsules (80 mg total) by mouth daily. 180 capsule 1   furosemide (LASIX) 40 MG tablet Take 1 tablet (40 mg total) by mouth daily as needed (swelling). 30 tablet 1   hydrOXYzine (ATARAX) 50 MG tablet TAKE ONE TABLET BY MOUTH UP TO THREE TIMES DAILY AS NEEDED FOR ANXIETY 90 tablet 2   ipratropium-albuterol (DUONEB) 0.5-2.5 (3) MG/3ML SOLN Take 3 mLs by nebulization every 4 (four) hours as needed (SOB, wheezing, coughing). 360 mL 1   metoprolol succinate (TOPROL-XL) 100 MG 24 hr tablet Take 1 tablet (100 mg total) by mouth daily. Take with or immediately following a meal. 30 tablet 3   mirtazapine (REMERON) 30 MG tablet Take 1 tablet (30 mg total) by mouth at bedtime. 30 tablet 0   Nebulizer System All-In-One MISC 1 Device by Does not apply route as directed. 1 each 0   nicotine (NICOTINE STEP 2) 14 mg/24hr patch  Place 1 patch (14 mg total) onto the skin daily. 28 patch 1   nitroGLYCERIN (NITROSTAT) 0.4 MG SL tablet Place 1 tablet (0.4 mg total) under the tongue every 5 (five) minutes as needed for chest pain. (Patient  taking differently: Place 0.4 mg under the tongue every 5 (five) minutes x 3 doses as needed for chest pain (if no relief after 3rd dose, proceed to ED or call 911).) 15 tablet 2   ondansetron (ZOFRAN-ODT) 8 MG disintegrating tablet TAKE ONE TABLET BY MOUTH EVERY 8 HOURS (Patient taking differently: Take 8 mg by mouth every 8 (eight) hours as needed for nausea or vomiting.) 60 tablet 3   oxyCODONE (ROXICODONE) 5 MG immediate release tablet Take 1 tablet (5 mg total) by mouth every 6 (six) hours as needed for severe pain (pain score 7-10). 20 tablet 0   OXYGEN Inhale 2 L into the lungs at bedtime.     pantoprazole (PROTONIX) 40 MG tablet TAKE (1) TABLET BY MOUTH TWICE A DAY BEFORE MEALS. (BREAKFAST AND SUPPER) 60 tablet 5   potassium chloride SA (KLOR-CON M) 20 MEQ tablet Take 1 tablet (20 mEq total) by mouth daily as needed. (Patient taking differently: Take 20 mEq by mouth daily as needed (Use with Furosemide).) 30 tablet 3   promethazine (PHENERGAN) 25 MG tablet TAKE ONE TABLET BY MOUTH EVERY 6 HOURS AS NEEDED FOR NAUSEA OR VOMITING. (Patient taking differently: Take 25 mg by mouth every 8 (eight) hours as needed for nausea or vomiting.) 30 tablet 3   sacubitril-valsartan (ENTRESTO) 24-26 MG TAKE ONE TABLET BY MOUTH TWICE DAILY 60 tablet 2   spironolactone (ALDACTONE) 25 MG tablet Take 0.5 tablets (12.5 mg total) by mouth daily. 45 tablet 2   SYMBICORT 160-4.5 MCG/ACT inhaler Inhale 2 puffs into the lungs 2 (two) times daily.     No current facility-administered medications for this visit.     Objective: Vital signs in last 24 hours: There were no vitals taken for this visit.  Intake/Output from previous day: No intake/output data recorded. Intake/Output this shift: @IOTHISSHIFT @   Physical Exam Vitals reviewed.  Constitutional:      Appearance: Normal appearance.  Cardiovascular:     Rate and Rhythm: Normal rate and regular rhythm.     Heart sounds: Normal heart sounds.  Pulmonary:      Effort: Pulmonary effort is normal.     Breath sounds: Normal breath sounds.  Abdominal:     General: Abdomen is flat.     Palpations: Abdomen is soft.     Tenderness: There is abdominal tenderness (diffuse, left > right.).  Neurological:     Mental Status: She is alert.     Lab Results:  No results found for this or any previous visit (from the past 24 hours).   BMET No results for input(s): "NA", "K", "CL", "CO2", "GLUCOSE", "BUN", "CREATININE", "CALCIUM" in the last 72 hours. PT/INR No results for input(s): "LABPROT", "INR" in the last 72 hours. ABG No results for input(s): "PHART", "HCO3" in the last 72 hours.  Invalid input(s): "PCO2", "PO2" I have reviewed her recent labs and cultures.  Studies/Results: No results found. I have reviewed CT's and MRI's back to 2014.   Assessment/Plan: Complex left upper pole cyst.  I will get an MRI and have her return with the results.  UTI.  I will get a culture and treat accordingly.   No orders of the defined types were placed in this encounter.    No orders of  the defined types were placed in this encounter.    No follow-ups on file.    CC: Dr. Celso Amy     Bjorn Pippin 06/27/2023

## 2023-07-02 ENCOUNTER — Ambulatory Visit: Payer: Medicaid Other | Admitting: Internal Medicine

## 2023-07-02 ENCOUNTER — Ambulatory Visit (HOSPITAL_COMMUNITY): Payer: Medicaid Other | Attending: Orthopedic Surgery | Admitting: Occupational Therapy

## 2023-07-03 ENCOUNTER — Telehealth (HOSPITAL_COMMUNITY): Payer: Self-pay | Admitting: Occupational Therapy

## 2023-07-03 NOTE — Telephone Encounter (Signed)
OT called and spoke with patient regarding her No Show to the Evaluation on 07/02/23. Pt reported that she is almost healed and does not need a splint made or evaluation at this time. Pt is following up with MD on 07/04/23.   Trish Mage, OTR/L [facility] [phone]

## 2023-07-05 ENCOUNTER — Ambulatory Visit: Payer: Medicaid Other | Attending: Nurse Practitioner | Admitting: Nurse Practitioner

## 2023-07-05 ENCOUNTER — Encounter: Payer: Self-pay | Admitting: Nurse Practitioner

## 2023-07-05 ENCOUNTER — Telehealth: Payer: Self-pay | Admitting: Nurse Practitioner

## 2023-07-05 ENCOUNTER — Other Ambulatory Visit: Payer: Self-pay | Admitting: Internal Medicine

## 2023-07-05 VITALS — BP 132/76 | HR 70 | Ht 61.0 in | Wt 149.4 lb

## 2023-07-05 DIAGNOSIS — I428 Other cardiomyopathies: Secondary | ICD-10-CM

## 2023-07-05 DIAGNOSIS — Z720 Tobacco use: Secondary | ICD-10-CM

## 2023-07-05 DIAGNOSIS — F5104 Psychophysiologic insomnia: Secondary | ICD-10-CM

## 2023-07-05 DIAGNOSIS — N281 Cyst of kidney, acquired: Secondary | ICD-10-CM | POA: Diagnosis not present

## 2023-07-05 DIAGNOSIS — I10 Essential (primary) hypertension: Secondary | ICD-10-CM | POA: Diagnosis not present

## 2023-07-05 DIAGNOSIS — I5032 Chronic diastolic (congestive) heart failure: Secondary | ICD-10-CM

## 2023-07-05 DIAGNOSIS — F4000 Agoraphobia, unspecified: Secondary | ICD-10-CM

## 2023-07-05 DIAGNOSIS — F4310 Post-traumatic stress disorder, unspecified: Secondary | ICD-10-CM

## 2023-07-05 DIAGNOSIS — R5383 Other fatigue: Secondary | ICD-10-CM | POA: Diagnosis not present

## 2023-07-05 DIAGNOSIS — F411 Generalized anxiety disorder: Secondary | ICD-10-CM

## 2023-07-05 DIAGNOSIS — G4700 Insomnia, unspecified: Secondary | ICD-10-CM

## 2023-07-05 DIAGNOSIS — F333 Major depressive disorder, recurrent, severe with psychotic symptoms: Secondary | ICD-10-CM

## 2023-07-05 NOTE — Telephone Encounter (Signed)
Patient informed and verbalized understanding of plan. Please cancel echo and f/u appointment please put on recall list for 1 year

## 2023-07-05 NOTE — Progress Notes (Addendum)
Cardiology Office Note:  .   Date:  07/05/2023  ID:  [name], DOB 10-18-1970, MRN [id number] PCP: Monica Lade, [name]  [facility] Providers Cardiologist:  Monica Bicker, [name]    History of Present Illness: [name] is a 53 y.o. female with a PMH of HFrecEF, HTN, IBS, chronic abdominal pain/nausea, GERD, colitis, and tobacco abuse, COPD, who presents today for overdue 6 month follow-up.   Saw her on Sep 24, 2022. Doing well from a cardiac perspective. Completed IV ABX x 3 days through PICC line after returning home for recent hospital admission for severe sepsis d/t UTI, E. coli bacteremia, pyelonephritis, colonic wall thickening/ colitis, and lobar pneumonia. Updated Echo revealed recovered EF 10/03/2022.   I last saw patient on November 12, 2022 for follow-up.  Was doing well at the time.  Was noted she had a mechanical fall about 2 nights prior to office visit, fell off a stepstool and oxygen tank landed on right foot, wonder if she has broken some toes.    Today she presents for follow-up.  She tells me she had another fall about 10 days ago and tells me she slipped in her tub in her bathroom.  She injured her right hand and has had surgery on this right hand.  She also wonders if she injured her left leg.  Chief concern today is fatigue and insomnia.  She also admits to some dyspnea on exertion with walking and going up and down stairs.  She tells me that she will sometimes go 2 to 3 days without sleeping.  STOP-BANG score 4.  She has never been tested for sleep apnea.  She says she is in between psychiatrists and PCP has deferred sleep medication to her psychiatrist. Denies any chest pain, palpitations, syncope, presyncope, dizziness, orthopnea, PND, swelling or significant weight changes, acute bleeding, or claudication.   Studies Reviewed: Marland Kitchen     EKG: EKG is not ordered today.  Limited Echo 09/2022:   1. Left ventricular ejection fraction, by estimation, is 60 to  65%. The  left ventricle has normal function. The left ventricle has no regional  wall motion abnormalities. Left ventricular diastolic parameters are  indeterminate. The average left  ventricular global longitudinal strain is -21.4 %. The global longitudinal  strain is normal.   2. Right ventricular systolic function is normal. The right ventricular  size is normal.   3. Limited echo evaluate LV function   Comparison(s): Echocardiogram done 09/05/22 showed an EF of 25-30%.     Physical Exam:   VS:  BP 132/76 (Cuff Size: Normal)   Pulse 70   Ht 5\' 1"  (1.549 m)   Wt 149 lb 6.4 oz (67.8 kg)   SpO2 99%   BMI 28.23 kg/m    Wt Readings from Last 3 Encounters:  07/05/23 149 lb 6.4 oz (67.8 kg)  06/19/23 150 lb (68 kg)  06/15/23 150 lb (68 kg)    GEN: Well nourished, well developed in no acute distress, appears fatigued NECK: No JVD; No carotid bruits CARDIAC: S1/S2, RRR, no murmurs, rubs, gallops RESPIRATORY:  Clear to auscultation without rales, wheezing or rhonchi  ABDOMEN: Soft, non-tender, non-distended EXTREMITIES: No edema, no deformity   ASSESSMENT AND PLAN: .    1. HFrecEF, nonischemic cardiomyopathy Stage C, class I-II symptoms. EF recovered to 60-65% in May 2024. Euvolemic and well compensated on exam. Continue current GDMT. Not a candidate for SGLT2i d/t past hx of sepsis.  Low sodium diet, fluid restriction <2L, and daily weights encouraged. Educated to contact our office for weight gain of 2 lbs overnight or 5 lbs in one week.  Will update limited echocardiogram at this time.  Heart healthy diet and regular cardiovascular exercise encouraged.   Addendum: Progress note routed to Dr. Bridgett Larsson after this was signed and Dr. Jenene Slicker stated, "Her LVEF from May 2024 is normal. No need to repeat Echo. Follow up in one year. No need to see her in 3 months if she is asymptomatic and doing well now. Fatigue and insomnia per PCP."  2. HTN As BP borderline elevated today.   Discussed SBP goal is less than 130.  She tells me that overall her BP is well-controlled per her report. Continue current medication regimen. Discussed to monitor BP at home at least 2 hours after medications and sitting for 5-10 minutes.  Given BP log and salty 6.  She will bring this back for her next follow-up visit for review.  If BP is not at goal in next 2-3 weeks, plan to increase Entresto.  Heart healthy diet and regular cardiovascular exercise encouraged. Pt will contact us in 2-3 weeks with her BP readings.    Tobacco abuse Smoking cessation encouraged and discussed.    Complex renal cyst Follows Dr. Wolfgang Phoenix. Continue to follow-up with Nephrology.  5.  Fatigue, insomnia Her fatigue is most likely due to not getting sleep.  As I was talking with her during interview, she appeared very fatigued and was trying to keep herself awake.  Continue follow-up with PCP and psychiatry.  No indication for sleep study at this time until insomnia is improved and resolved.  Care and ED precautions discussed.    Dispo: Follow-up with Dr. Jenene Slicker or APP in 1 year or sooner if anything changes.   Signed, Sharlene Dory, NP

## 2023-07-05 NOTE — Patient Instructions (Addendum)
 Medication Instructions:  Your physician recommends that you continue on your current medications as directed. Please refer to the Current Medication list given to you today.  Labwork: None   Testing/Procedures: Your physician has requested that you have an echocardiogram. Echocardiography is a painless test that uses sound waves to create images of your heart. It provides your doctor with information about the size and shape of your heart and how well your heart's chambers and valves are working. This procedure takes approximately one hour. There are no restrictions for this procedure. Please do NOT wear cologne, perfume, aftershave, or lotions (deodorant is allowed). Please arrive 15 minutes prior to your appointment time.  Please note: We ask at that you not bring children with you during ultrasound (echo/ vascular) testing. Due to room size and safety concerns, children are not allowed in the ultrasound rooms during exams. Our front office staff cannot provide observation of children in our lobby area while testing is being conducted. An adult accompanying a patient to their appointment will only be allowed in the ultrasound room at the discretion of the ultrasound technician under special circumstances. We apologize for any inconvenience.  Follow-Up: Your physician recommends that you schedule a follow-up appointment in: 3 months   Any Other Special Instructions Will Be Listed Below (If Applicable).  If you need a refill on your cardiac medications before your next appointment, please call your pharmacy.

## 2023-07-05 NOTE — Telephone Encounter (Signed)
-----   Message from Sharlene Dory sent at 07/05/2023  9:49 AM EST ----- Routed my note over to Dr. Jenene Slicker for review. She is requesting to cancel Echo and have patient follow-up in 1 year. Please arrange. I will addendum my note. She needs to call our office in 1-2 weeks with her BP readings.   Thanks!   Best, Sharlene Dory, NP

## 2023-07-08 ENCOUNTER — Ambulatory Visit: Payer: Medicaid Other | Admitting: Internal Medicine

## 2023-07-09 DIAGNOSIS — S89202D Unspecified physeal fracture of upper end of left fibula, subsequent encounter for fracture with routine healing: Secondary | ICD-10-CM | POA: Diagnosis not present

## 2023-07-14 DIAGNOSIS — J449 Chronic obstructive pulmonary disease, unspecified: Secondary | ICD-10-CM | POA: Diagnosis not present

## 2023-07-15 DIAGNOSIS — Z79891 Long term (current) use of opiate analgesic: Secondary | ICD-10-CM | POA: Diagnosis not present

## 2023-07-17 ENCOUNTER — Telehealth: Payer: Self-pay | Admitting: Internal Medicine

## 2023-07-17 NOTE — Telephone Encounter (Signed)
 Sent to [place] to get Wert to sign then fax

## 2023-07-19 NOTE — Telephone Encounter (Signed)
 Signed forms returned  via fax to [facility]

## 2023-07-21 ENCOUNTER — Other Ambulatory Visit: Payer: Self-pay | Admitting: Internal Medicine

## 2023-07-23 ENCOUNTER — Other Ambulatory Visit: Payer: Medicaid Other

## 2023-07-26 ENCOUNTER — Other Ambulatory Visit: Payer: Self-pay | Admitting: Gastroenterology

## 2023-07-26 DIAGNOSIS — R112 Nausea with vomiting, unspecified: Secondary | ICD-10-CM

## 2023-07-31 ENCOUNTER — Other Ambulatory Visit: Payer: Self-pay | Admitting: Internal Medicine

## 2023-07-31 DIAGNOSIS — F411 Generalized anxiety disorder: Secondary | ICD-10-CM

## 2023-07-31 DIAGNOSIS — F4310 Post-traumatic stress disorder, unspecified: Secondary | ICD-10-CM

## 2023-07-31 DIAGNOSIS — F5104 Psychophysiologic insomnia: Secondary | ICD-10-CM

## 2023-07-31 DIAGNOSIS — F333 Major depressive disorder, recurrent, severe with psychotic symptoms: Secondary | ICD-10-CM

## 2023-07-31 DIAGNOSIS — F4000 Agoraphobia, unspecified: Secondary | ICD-10-CM

## 2023-08-03 NOTE — Progress Notes (Unsigned)
 [name], female    DOB: Jul 21, 1970    MRN: [id number]  Brief patient profile:  36  yowm active smoker  referred to pulmonary clinic in   02/21/2023 by Sharlene Dory  for ? Copd - had CB x around 2018 then admitted April 2024 [facility]   Admit date: 09/02/2022 Discharge date: 09/09/2022   Brief Hospitalization Summary: Please see all hospital notes, images, labs for full details of the hospitalization. ADMISSION HPI:  53 year old female with a history of hypertension, chronic abdominal pain, IBS, colitis, constipation, chronic nausea presenting with worsening generalized abdominal pain Last 24 hours prior to admission.  The patient's spouse at the bedside supplements the history.  The patient is a difficult historian.  Notably, the patient's spouse states that the patient has developed chest congestion, coughing, and some dyspnea on exertion over the past week.  She continues to smoke 1 pack/day.  She has not been on any recent antibiotics.  She has had some generalized weakness.  She has had decreased oral intake.  However, in the last 24 hours prior to admission she developed numerous episodes of nausea and vomiting.  There is no hematemesis.  The patient has not had any diarrhea.  There is no hematochezia or melena.  Spouse also relates that the patient has had some symptoms of urinary urgency in the last day to 2 days.  The patient normally takes BC powders 2 times per week for pain.  There is no hematuria or hemoptysis.  There is no hematemesis. Because of her worsening condition, the patient was brought to emergency department for further evaluation and treatment.   The patient has an extensive GI history for her abdominal pain and IBS and colitis.  Notably, in April 2022 the patient was noted to have colonic wall thickening/colitis on CT.  She was  treated empirically for IBD/suspected Crohn's with prednisone and Entocort with follow-up colonoscopy in February 2023 (off all  steroids for several months)   normal other than nonbleeding internal hemorrhoids and normal colon biopsies.   More recently, the patient was seen in the emergency department on 05/27/2022 with worsening abdominal pain, persistent vomiting throughout the day. CT A/P showed diffuse colonic wall thickening extending from distal transverse colon throughout the rectosigmoid junction consistent with inflammatory or infectious colitis. She was given a 5-day course of prednisone. She also had CT angiography 06/05/2022 that showed patent mesenteric vasculature. She was prescribed a 5-day course of Cipro.   She was most recently in the GI clinic on 08/23/2022 for her abdominal pain, IBS, and possible colitis.  It was felt that her chronic abdominal pain was multifactorial including IBS, bowel hypersensitivity syndrome, and functional abdominal pain.  She was instructed to continue dicyclomine and Linzess. Regarding her chronic nausea and vomiting she has had extensive workup withMRI abdomen in 2022 without biliary abnormalities. Gastric emptying study normal in 2023 and EGD February 2023 consistent with Candida esophagitis and gastritis.   In the ED, the patient was afebrile hemodynamically stable with oxygen saturation 90% on room air.  WBC 16.9, hemoglobin 8.4, platelets 222,000.  Sodium 136, potassium 3.0, bicarbonate 25, serum creatinine 0.92.  AST 47, ALT 18, alk phosphatase 187, total bilirubin 0.6.  CTA abdomen was negative for any contrast extravasation.  Did show focal area of swelling and decreased enhancement in the left kidney with mild fat perinephric stranding.  There was bowel thickening and mucosal enhancement in the ascending and proximal transverse colon.  CTA chest was negative for  any aortic dissection.  There is moderate GGO bilateral with nodular consolidation.  The patient was started on ceftriaxone and metronidazole.    Assessment/Plan:   Severe Sepsis due to UTI  -presented with fever  (103.5) and leukocytosis -due to bacteremia, pyelonephritis and PNA -lactic peaked at 5.0 -initially on  ceftriaxone, azithro, metronidazole -follow blood culture--ESBL E coli  -changed to meropenem/ertapenem for a full 10 day course    E. coli bacteremia -Preliminary multiplex PCR suggest ESBL -Discontinue ceftriaxone -continue meropenem -Discontinue metronidazole -ID consult requested and recommendation is for 10 day course of meropenem/ertapenem    Acute respiratory failure with hypoxia -Presented with tachypnea and hypoxia with saturation 90% on room air -Secondary to pneumonia in the setting of COPD -Stable on 2 L nasal cannula>>weaned to RA -Wean oxygen as tolerated for saturation greater 92% -09/04/22 evening--pt had respiratory distress -09/04/22 CXR--personally reviewed--bilateral interstitial and nodular infiltrates -weaned down to 2L prior to discharge   Acute pulmonary edema - TREATED  -09/04/22 evening--respiratory distress -start IV lasix - transitioned to oral lasix to start 09/09/22  -BNP 2180 -Echo - EF 25-30%  -DC on oral lasix 40 mg daily with potassium supplement -pt has appt to follow up with cardiology on 09/24/22   Echo 10/02/22  wnl  with cards opinion =  Her cardiomyopathy was in the setting of her past acute illness (sepsis), NICM.    History of Present Illness  02/21/2023  Pulmonary/ 1st office eval/ Rae Plotner / [place] [facility] on spiriva dpi  Chief Complaint  Patient presents with   Consult  Dyspnea:  only goes out to go to doctor - just walking room to room at home  Cough: p supper sometimes worse / min mucoid  Sleep: bed if flat with 3 pillows and difficult to breath is lies flatter SABA use: duoneb helps at hs and saba x 3-4 has spiriva 02: 4 lpm at hs : 4lpm p exert  Lung cancer screen: referred today Rec Plan A = Automatic = Always=   Symbicort 80 (same as Breztri x 1 puff)  Take 2 puffs first thing in am and then another 2 puffs about 12 hours  later.  Work on inhaler technique:  Plan B = Backup (to supplement plan A, not to replace it) Only use your albuterol inhaler as a rescue medication  Plan C = Crisis (instead of Plan B but only if Plan B stops working) - only use your albuterol- ipatropium nebulizer if you first try Plan B  Also  Ok to try albuterol 15 min before an activity (on alternating days)  that you know would usually make you short of breath Protonix 40mg   Take 30- 60 min before your first and last meals of the day and Pepcid 20 mg after supper GERD diet reviewed, bed blocks rec    The key is to stop smoking completely before smoking completely stops you!   Please schedule a follow up office visit in 6 weeks, call sooner if needed with all medications /inhalers/ solutions in hand     04/04/2023  f/u ov/[place] office/Ura Hausen re: GOLD ? Copd / AB  maint on Breztri   Chief Complaint  Patient presents with   COPD  Dyspnea:  short of breath across the house without 02  Cough: after supper worse non producive  Sleeping: bed is flat 3 pillows or feel chokin /suffocating    x  years /also overt hb  SABA use: several x per day never pre treats  02:  2lpm at bedtime / same day time  but only at rest, not with activity  Lung cancer screening: not due until 08/2023  Rec     08/06/2023  f/u ov/[place] office/Ziasia Lenoir re: *** maint on ***  No chief complaint on file.   Dyspnea:  *** Cough: *** Sleeping: ***   resp cc  SABA use: *** 02: ***  Lung cancer screening: ***   No obvious day to day or daytime variability or assoc excess/ purulent sputum or mucus plugs or hemoptysis or cp or chest tightness, subjective wheeze or overt sinus or hb symptoms.    Also denies any obvious fluctuation of symptoms with weather or environmental changes or other aggravating or alleviating factors except as outlined above   No unusual exposure hx or h/o childhood pna/ asthma or knowledge of premature birth.  Current Allergies,  Complete Past Medical History, Past Surgical History, Family History, and Social History were reviewed in [employer] record.  ROS  The following are not active complaints unless bolded Hoarseness, sore throat, dysphagia, dental problems, itching, sneezing,  nasal congestion or discharge of excess mucus or purulent secretions, ear ache,   fever, chills, sweats, unintended wt loss or wt gain, classically pleuritic or exertional cp,  orthopnea pnd or arm/hand swelling  or leg swelling, presyncope, palpitations, abdominal pain, anorexia, nausea, vomiting, diarrhea  or change in bowel habits or change in bladder habits, change in stools or change in urine, dysuria, hematuria,  rash, arthralgias, visual complaints, headache, numbness, weakness or ataxia or problems with walking or coordination,  change in mood or  memory.        No outpatient medications have been marked as taking for the 08/06/23 encounter (Appointment) with Nyoka Cowden, MD.              Past Medical History:  Diagnosis Date   Chronic abdominal pain    Chronic back pain    Colitis    Constipation    GERD (gastroesophageal reflux disease)    Gram-negative bacteremia 09/03/2022   Hypertension    Migraine    Plumbism    blood clot    PONV (postoperative nausea and vomiting)    Pyelonephritis 09/02/2022   Renal mass    Sepsis due to Escherichia coli (E. coli) (HCC) 09/03/2022   Sepsis due to undetermined organism (HCC) 09/02/2022   Uterine cancer (HCC)    age 89      Objective:    wts  08/06/2023        ***  04/04/2023     137   02/21/23 136 lb 8 oz (61.9 kg)  02/11/23 133 lb 6.4 oz (60.5 kg)  11/30/22 127 lb (57.6 kg)    Vital signs reviewed  08/06/2023  - Note at rest 02 sats  ***% on ***   General appearance:    ***     Min barr***          Assessment

## 2023-08-06 ENCOUNTER — Ambulatory Visit (HOSPITAL_COMMUNITY)
Admission: RE | Admit: 2023-08-06 | Discharge: 2023-08-06 | Disposition: A | Discharge status: Home / Self Care | Payer: Medicaid Other | Source: Ambulatory Visit | Attending: Internal Medicine | Admitting: Internal Medicine

## 2023-08-06 ENCOUNTER — Encounter: Payer: Self-pay | Admitting: Internal Medicine

## 2023-08-06 ENCOUNTER — Ambulatory Visit: Payer: Medicaid Other | Admitting: Internal Medicine

## 2023-08-06 VITALS — BP 98/60 | HR 76 | Ht 61.0 in | Wt 152.0 lb

## 2023-08-06 DIAGNOSIS — F17210 Nicotine dependence, cigarettes, uncomplicated: Secondary | ICD-10-CM

## 2023-08-06 DIAGNOSIS — R058 Other specified cough: Secondary | ICD-10-CM | POA: Diagnosis not present

## 2023-08-06 DIAGNOSIS — J449 Chronic obstructive pulmonary disease, unspecified: Secondary | ICD-10-CM

## 2023-08-06 LAB — PULMONARY FUNCTION TEST
DL/VA % pred: 81 %
DL/VA: 3.55 ml/min/mmHg/L
DLCO unc % pred: 64 %
DLCO unc: 12.06 ml/min/mmHg
FEF 25-75 Post: 0.88 L/s
FEF 25-75 Pre: 1 L/s
FEF2575-%Change-Post: -12 %
FEF2575-%Pred-Post: 36 %
FEF2575-%Pred-Pre: 41 %
FEV1-%Change-Post: 3 %
FEV1-%Pred-Post: 59 %
FEV1-%Pred-Pre: 57 %
FEV1-Post: 1.44 L
FEV1-Pre: 1.39 L
FEV1FVC-%Change-Post: 18 %
FEV1FVC-%Pred-Pre: 84 %
FEV6-%Change-Post: -10 %
FEV6-%Pred-Post: 60 %
FEV6-%Pred-Pre: 67 %
FEV6-Post: 1.81 L
FEV6-Pre: 2.02 L
FEV6FVC-%Pred-Post: 103 %
FEV6FVC-%Pred-Pre: 103 %
FVC-%Change-Post: -13 %
FVC-%Pred-Post: 58 %
FVC-%Pred-Pre: 67 %
FVC-Post: 1.81 L
FVC-Pre: 2.09 L
Post FEV1/FVC ratio: 79 %
Post FEV6/FVC ratio: 100 %
Pre FEV1/FVC ratio: 67 %
Pre FEV6/FVC Ratio: 100 %
RV % pred: 73 %
RV: 1.27 L
TLC % pred: 78 %
TLC: 3.63 L

## 2023-08-06 MED ORDER — ALBUTEROL SULFATE (2.5 MG/3ML) 0.083% IN NEBU
2.5000 mg | INHALATION_SOLUTION | Freq: Once | RESPIRATORY_TRACT | Status: AC
  Administered 2023-08-06: 2.5 mg via RESPIRATORY_TRACT

## 2023-08-06 NOTE — Patient Instructions (Addendum)
 My office will be contacting you by phone for referral to lung cancer screening   [phone]) - if you don't hear back from my office within one week,  please call us back or notify us thru MyChart and we'll address it right away.    If you drop below  89% walking without 0xygen and it is corrected above 90% on 2lpm,,  then you should qualify for the portable concentrator   The key is to stop smoking completely before smoking completely stops you!    Please schedule a follow up visit in 6  months but call sooner if needed

## 2023-08-07 NOTE — Assessment & Plan Note (Signed)
 Counseled re importance of smoking cessation but did not meet time criteria for separate billing    Low-dose CT lung cancer screening is recommended for patients who are 10-55 years of age with a 20+ pack-year history of smoking and who are currently smoking or quit <=15 years ago. No coughing up blood  No unintentional weight loss of > 15 pounds in the last 6 months - pt is eligible for scanning yearly until 15 y p quits > referred to screening program          Each maintenance medication was reviewed in detail including emphasizing most importantly the difference between maintenance and prns and under what circumstances the prns are to be triggered using an action plan format where appropriate.  Total time for H and P, chart review, counseling, reviewing hfa  device(s) and generating customized AVS unique to this office visit / same day charting = 33 min summary f/u ov

## 2023-08-07 NOTE — Assessment & Plan Note (Signed)
 Active smoker  - 02/21/2023  After extensive coaching inhaler device,  effectiveness =    75% from a baseline of 50% > try breztri sample then change to symbicort 80 1 bid to address cough and stop spiriva > did not change to symbicort as of 04/04/2023 and cough early insp present > try again symb 80 2 bid  - PFT's  08/06/23  FEV1 1.44 (59 % ) ratio 0.79  p 3 % improvement from saba p symbicort 80 prior to study with DLCO  12 (64%)   and FV curve flattened on insp   ERV 19% at wt 150   So far no evidence of copd but clinically has AB and still smoking so she is at risk   Rec no change in rx for now  Regular walking and Make sure you check your oxygen saturation at your highest level of activity(NOT after you stop)  to be sure it stays over 90% and keep track of it at least once a week, more often if breathing getting worse, and let me know if losing ground. (Collect the dots to connect the dots approach)

## 2023-08-07 NOTE — Assessment & Plan Note (Signed)
 Onset ? P started entresto ?  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   These are the same pts (now being labeled as having "irritable larynx syndrome" by some cough centers) who not infrequently have a history of having failed to tolerate ace inhibitors (and entresto),  dry powder inhalers or biphosphonates or report having atypical/extraesophageal reflux symptoms (like LPR)  that don't respond to standard doses of PPI  and are easily confused as having aecopd or asthma flares by even experienced allergists/ pulmonologists (myself included).   Rec continue gerd rx/ consider alternatives for entresto/ f/u ENT prn

## 2023-08-11 ENCOUNTER — Other Ambulatory Visit: Payer: Self-pay | Admitting: Gastroenterology

## 2023-08-11 DIAGNOSIS — K219 Gastro-esophageal reflux disease without esophagitis: Secondary | ICD-10-CM

## 2023-08-11 DIAGNOSIS — R1084 Generalized abdominal pain: Secondary | ICD-10-CM

## 2023-08-11 DIAGNOSIS — G8929 Other chronic pain: Secondary | ICD-10-CM

## 2023-08-13 DIAGNOSIS — Z79891 Long term (current) use of opiate analgesic: Secondary | ICD-10-CM | POA: Diagnosis not present

## 2023-08-13 NOTE — Progress Notes (Deleted)
 Referring Provider: Billie Lade, MD Primary Care Physician:  Billie Lade, MD Primary GI Physician: Dr. Bonnetta Barry chief complaint on file.   HPI:   [name] is a 53 y.o. female  with history of GERD, Candida esophagitis, chronic nausea, chronic constipation, chronic abdominal pain, colitis on CTs previously. She has been empirically treated for IBD/suspected Crohn's with prednisone and Entocort but has also been treated with antibiotics. It is unclear if she truly has IBD, but ha s been suspected to be more IBS. Follow-up colonoscopy February 2023 (off steroids for several months) with normal exam aside from nonbleeding internal hemorrhoids and normal colon biopsies.    Previously admitted in April 2024 after presenting with worsening abdominal pain and found to have sepsis in the setting of bacteremia, pyelonephritis, pneumonia, concern for colitis on CT.  C. difficile and GI pathogen panel were negative.  She was treated empirically with antibiotics.  Recommended outpatient colonoscopy. She was also found to have anemia with hemoglobin 8.4 on admission, down from 13.2 in January 2024, heme-negative, but iron and iron saturation low.  Hemoglobin declined as low as 6.5 with no overt GI bleeding.  She has been taking 1-2 BC powders daily for headaches outpatient.  She received 2 units PRBCs and hemoglobin improved to 12.5, 10.5 at the time of discharge.  We had initially considered EGD inpatient, but echocardiogram revealed EF of 25-30% with circumferential pericardial effusion.  Recommended against inpatient procedures.  Could consider outpatient once medically improved.   Repeat echo 5/21 with EF improved to 60-65%.    Last seen in office 11/30/2022.  She felt that her colitis was flaring again.  Reported having frequent watery diarrhea, primarily upper abdominal pain for the last 2 days, consistent with her prior flares.  Reported prior to this, she had been doing well with 1-2  formed stools daily with Bentyl 20 mg twice daily.  Had not really been having any abdominal pain.  Her chronic nausea was well-controlled with Zofran and Phenergan.  She denies any recent antibiotics, travel, medication changes, sick contacts.  Denied NSAIDs.  GERD remains well-controlled. She was taking PPI BID and denied NISAD use (discontinued BC powders).  Unfortunately, due to global epic/computer outage, we were unable to obtain stat CT.  She was started on Cipro and Flagyl empirically for suspected colitis.  Labs and stool studies were ordered and advised patient to have them completed ASAP.  Would need colonoscopy and EGD once acute symptoms resolved for follow-up on colitis and IDA.    Labs/stool studies not completed.    Today:  Abdominal pain/diarrhea/history of colitis:    GERD:    Chronic nausea:    IDA:  Most recent labs 06/22/2023 with hemoglobin 11.4, normocytic indices.   Elevated alkaline phosphatase: Appears this has been a chronic, intermittent issue dating back to 2021.  Most recent labs 06/19/2023 with alk phos 207.  GGT within normal limits 06/14/2023. Hep C Ab negative September 2024.  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   CHF (congestive heart failure) (HCC)    Chronic abdominal pain    Chronic back pain    Chronic kidney disease, stage 1 07/04/2022   Colitis    Constipation    COPD (chronic obstructive pulmonary  disease) (HCC) 04/04/2023   Depression 03/14/2023   Dyspnea    with exertion   GERD (gastroesophageal reflux disease)    Gram-negative bacteremia 09/03/2022   History of blood transfusion 08/2022   2 units transfused at [facility]   Hypertension    Insomnia    Kidney cysts    left kidney   MDD (major depressive disorder)    Migraine    Nonischemic cardiomyopathy (HCC) 06/14/2023   Oxygen  dependent    2L via [place] prn 24/7   Plumbism    blood clot    Pneumonia    x several   PONV (postoperative nausea and vomiting)    PTSD (post-traumatic stress disorder)    Pyelonephritis 09/02/2022   Renal mass    Sepsis due to Escherichia coli (E. 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   Uterine cancer [facility])    age 50    Past Surgical History:  Procedure Laterality Date   ABDOMINAL HYSTERECTOMY     BACK SURGERY     BIOPSY  07/07/2021   Procedure: BIOPSY;  Surgeon: Lanelle Bal, DO;  Location: [facility];  Service: Endoscopy;;   CHOLECYSTECTOMY     Complicated by bile leak requiring ERCP with temporary stenting   COLONOSCOPY WITH ESOPHAGOGASTRODUODENOSCOPY (EGD)  07/2009   [facility]: Op notes cannot be seen through care everywhere however pathology showed terminal ileum and random colon biopsies normal.  Stomach biopsy with gastric antral and fundic mucosa with reactive foveolar hyperplasia.  No active gastritis.  Stains negative for H. pylori.   COLONOSCOPY WITH PROPOFOL N/A 07/07/2021   Surgeon: Earnest Bailey K, DO;  Non-bleeding internal hemorrhoids, normal TI  biopsied, normal colon biopsied. Pathology with colon and TI biopsies normal.   ESOPHAGEAL BRUSHING  07/07/2021   Procedure: ESOPHAGEAL BRUSHING;  Surgeon: Lanelle Bal, DO;  Location: [facility];  Service: Endoscopy;;   ESOPHAGOGASTRODUODENOSCOPY  06/2007   [facility]: Normal esophagus, gastric mucosal abnormality with erythema, biopsy showed gastric antral and fundic mucosa with reactive foveolar hyperplasia, no gastritis, no H. pylori.   ESOPHAGOGASTRODUODENOSCOPY (EGD) WITH PROPOFOL N/A 07/07/2021   Surgeon: Lanelle Bal, DO; moderately severe Candida esophagitis, gastritis, biopsies negative for H. pylori, normal examined duodenum.  Prescribed Diflucan and recommended PPI twice daily.   HAND SURGERY      OPEN REDUCTION INTERNAL FIXATION (ORIF) PROXIMAL PHALANX Right 06/19/2023   Procedure: OPEN REDUCTION INTERNAL FIXATION (ORIF) OF RIGHT SMALL FINGER PROXIMAL PHALANX FRACTURE;  Surgeon: Mack Hook, MD;  Location: [facility] OR;  Service: Orthopedics;  Laterality: Right;    Current Outpatient Medications  Medication Sig Dispense Refill   albuterol (VENTOLIN HFA) 108 (90 Base) MCG/ACT inhaler Inhale 2 puffs into the lungs every 4 (four) hours as needed for shortness of breath or wheezing (COPD).     atorvastatin (LIPITOR) 40 MG tablet Take 1 tablet (40 mg total) by mouth daily. 90 tablet 3   Buprenorphine HCl-Naloxone HCl 8-2 MG FILM Place 1 Film under the tongue 2 (two) times daily.     dicyclomine (BENTYL) 20 MG tablet TAKE ONE TABLET BY MOUTH FOUR TIMES DAILY BEFORE MEALS AND AT BEDTIME. TAKE ONE TABLET THREE TIMES DAILY WITH FOOD. HOLD FOR CONSTIPATION. 120 tablet 3   famotidine (PEPCID) 20 MG tablet Take 20 mg by mouth daily as needed (For break through heart burn).     FLUoxetine (PROZAC) 40  MG capsule Take 2 capsules (80 mg total) by mouth daily. 180 capsule 1   furosemide (LASIX) 40 MG tablet Take 1 tablet (40 mg total) by mouth daily as needed (swelling). 30 tablet 1   hydrOXYzine (ATARAX) 50 MG tablet TAKE ONE TABLET BY MOUTH UP TO THREE TIMES DAILY AS NEEDED FOR ANXIETY 90 tablet 2   ipratropium-albuterol (DUONEB) 0.5-2.5 (3) MG/3ML SOLN Take 3 mLs by nebulization every 4 (four) hours as needed (SOB, wheezing, coughing). 360 mL 1   metoprolol succinate (TOPROL-XL) 100 MG 24 hr tablet TAKE ONE TABLET BY MOUTH ONCE DAILY WITH OR IMMEDIATELY FOLLOWING A MEAL 30 tablet 3   mirtazapine (REMERON) 30 MG tablet TAKE ONE TABLET BY MOUTH AT BEDTIME 30 tablet 0   Nebulizer System All-In-One MISC 1 Device by Does not apply route as directed. 1 each 0   nicotine (NICOTINE STEP 2) 14 mg/24hr patch Place 1 patch (14 mg total) onto the skin daily. 28 patch 1   nitroGLYCERIN (NITROSTAT) 0.4 MG SL tablet  Place 1 tablet (0.4 mg total) under the tongue every 5 (five) minutes as needed for chest pain. (Patient taking differently: Place 0.4 mg under the tongue every 5 (five) minutes x 3 doses as needed for chest pain (if no relief after 3rd dose, proceed to ED or call 911).) 15 tablet 2   ondansetron (ZOFRAN-ODT) 8 MG disintegrating tablet TAKE ONE TABLET BY MOUTH EVERY 8 HOURS (Patient taking differently: Take 8 mg by mouth every 8 (eight) hours as needed for nausea or vomiting.) 60 tablet 3   oxyCODONE (ROXICODONE) 5 MG immediate release tablet Take 1 tablet (5 mg total) by mouth every 6 (six) hours as needed for severe pain (pain score 7-10). 20 tablet 0   OXYGEN Inhale 2 L into the lungs at bedtime.     pantoprazole (PROTONIX) 40 MG tablet TAKE ONE TABLET BY MOUTH TWICE DAILY WITH FOOD (BREAKFAST AND SUPPER) 60 tablet 5   potassium chloride SA (KLOR-CON M) 20 MEQ tablet Take 1 tablet (20 mEq total) by mouth daily as needed. (Patient taking differently: Take 20 mEq by mouth daily as needed (Use with Furosemide).) 30 tablet 3   promethazine (PHENERGAN) 25 MG tablet TAKE ONE TABLET BY MOUTH EVERY 6 HOURS AS NEEDED FOR NAUSEA OR VOMITING. 30 tablet 3   sacubitril-valsartan (ENTRESTO) 24-26 MG TAKE ONE TABLET BY MOUTH TWICE DAILY 60 tablet 2   spironolactone (ALDACTONE) 25 MG tablet Take 0.5 tablets (12.5 mg total) by mouth daily. 45 tablet 2   SYMBICORT 160-4.5 MCG/ACT inhaler Inhale 2 puffs into the lungs 2 (two) times daily.     No current facility-administered medications for this visit.    Allergies as of 08/14/2023 - Review Complete 08/06/2023  Allergen Reaction Noted   Buprenorphine Other (See Comments) 01/24/2016   Penicillins Swelling and Rash 07/08/2011   Aripiprazole Other (See Comments) 08/01/2020   Seroquel [quetiapine] Other (See Comments) 08/18/2020   Cyclobenzaprine  07/04/2022   Duloxetine hcl  07/04/2022   Cefaclor Other (See Comments)    Doxycycline Rash 08/01/2020    Erythromycin Rash 07/04/2022   Gabapentin Nausea And Vomiting 01/24/2016   Ibuprofen Swelling and Rash 07/08/2011   Levofloxacin Rash 01/24/2016   Lisinopril Nausea And Vomiting 08/01/2020   Macrobid [nitrofurantoin monohyd macro] Rash 11/30/2014   Naproxen Swelling and Rash 07/08/2011   Pregabalin Rash 07/04/2022    Family History  Adopted: Yes  Problem Relation Age of Onset   Colon cancer Maternal Grandfather  27s   Colon cancer Maternal Uncle        87s   Cancer Other    Seizures Other    Stroke Other    Diabetes Other     Social History   Socioeconomic History   Marital status: Married    Spouse name: benito   Number of children: 2   Years of education: Not on file   Highest education level: Associate degree: academic program  Occupational History    Comment: not employed  Tobacco Use   Smoking status: Every Day    Current packs/day: 1.00    Average packs/day: 1 pack/day for 20.0 years (20.0 ttl pk-yrs)    Types: Cigarettes, E-cigarettes   Smokeless tobacco: Never   Tobacco comments:    Half a pack per day and utilizing nicotine patches as of 03/14/2023  Vaping Use   Vaping status: Never Used  Substance and Sexual Activity   Alcohol use: Not Currently    Comment: Quit in 2021.  See psychiatry note from 03/14/2023   Drug use: Yes    Types: Benzodiazepines, Hydrocodone    Comment: See psychiatry note from 04/09/23   Sexual activity: Yes    Birth control/protection: Surgical    Comment: Hysterectomy  Other Topics Concern   Not on file  Social History Narrative   Not on file   Social Drivers of Health   Financial Resource Strain: High Risk (05/06/2023)   Overall Financial Resource Strain (CARDIA)    Difficulty of Paying Living Expenses: Very hard  Food Insecurity: Food Insecurity Present (05/06/2023)   Hunger Vital Sign    Worried About Running Out of Food in the Last Year: Often true    Ran Out of Food in the Last Year: Sometimes true   Transportation Needs: No Transportation Needs (05/06/2023)   PRAPARE - Administrator, Civil Service (Medical): No    Lack of Transportation (Non-Medical): No  Physical Activity: Unknown (05/06/2023)   Exercise Vital Sign    Days of Exercise per Week: 0 days    Minutes of Exercise per Session: Not on file  Stress: Stress Concern Present (05/06/2023)   [employer] of Occupational Health - Occupational Stress Questionnaire    Feeling of Stress : Very much  Social Connections: Unknown (05/06/2023)   Social Connection and Isolation Panel [NHANES]    Frequency of Communication with Friends and Family: Patient declined    Frequency of Social Gatherings with Friends and Family: Never    Attends Religious Services: Never    Database administrator or Organizations: No    Attends Engineer, structural: Not on file    Marital Status: Married    Review of Systems: Gen: Denies fever, chills, anorexia. Denies fatigue, weakness, weight loss.  CV: Denies chest pain, palpitations, syncope, peripheral edema, and claudication. Resp: Denies dyspnea at rest, cough, wheezing, coughing up blood, and pleurisy. GI: Denies vomiting blood, jaundice, and fecal incontinence.   Denies dysphagia or odynophagia. Derm: Denies rash, itching, dry skin Psych: Denies depression, anxiety, memory loss, confusion. No homicidal or suicidal ideation.  Heme: Denies bruising, bleeding, and enlarged lymph nodes.  Physical Exam: There were no vitals taken for this visit. General:   Alert and oriented. No distress noted. Pleasant and cooperative.  Head:  Normocephalic and atraumatic. Eyes:  Conjuctiva clear without scleral icterus. Heart:  S1, S2 present without murmurs appreciated. Lungs:  Clear to auscultation bilaterally. No wheezes, rales, or rhonchi. No distress.  Abdomen:  +  BS, soft, non-tender and non-distended. No rebound or guarding. No HSM or masses noted. Msk:  Symmetrical without gross  deformities. Normal posture. Extremities:  Without edema. Neurologic:  Alert and  oriented x4 Psych:  Normal mood and affect.    Assessment:     Plan:  ***   Ermalinda Memos, PA-C [facility] 08/14/2023

## 2023-08-14 ENCOUNTER — Ambulatory Visit: Admitting: Gastroenterology

## 2023-08-14 DIAGNOSIS — J449 Chronic obstructive pulmonary disease, unspecified: Secondary | ICD-10-CM | POA: Diagnosis not present

## 2023-08-21 ENCOUNTER — Other Ambulatory Visit: Payer: Self-pay | Admitting: Nurse Practitioner

## 2023-08-21 ENCOUNTER — Telehealth: Payer: Self-pay | Admitting: Acute Care

## 2023-08-21 ENCOUNTER — Other Ambulatory Visit: Payer: Self-pay | Admitting: Internal Medicine

## 2023-08-21 ENCOUNTER — Other Ambulatory Visit: Payer: Self-pay

## 2023-08-21 DIAGNOSIS — F17210 Nicotine dependence, cigarettes, uncomplicated: Secondary | ICD-10-CM

## 2023-08-21 DIAGNOSIS — Z122 Encounter for screening for malignant neoplasm of respiratory organs: Secondary | ICD-10-CM

## 2023-08-21 DIAGNOSIS — Z87891 Personal history of nicotine dependence: Secondary | ICD-10-CM

## 2023-08-21 NOTE — Telephone Encounter (Signed)
 Lung Cancer Screening Narrative/Criteria Questionnaire (Cigarette Smokers Only- No Cigars/Pipes/vapes)   [name]   SDMV:09/23/23 at [id number] /Sarah                                           Nov 03, 1970              LDCT: 09/23/23 at 130p Vangie Bicker    52 y.o.   Phone: [phone]  Lung Screening Narrative (confirm age 57-77 yrs Medicare / 50-80 yrs Private pay insurance)   Insurance information:medicaid   Referring Provider:Wert   This screening involves an initial phone call with a team member from our program. It is called a shared decision making visit. The initial meeting is required by insurance and Medicare to make sure you understand the program. This appointment takes about 15-20 minutes to complete. The CT scan will completed at a separate date/time. This scan takes about 5-10 minutes to complete and you may eat and drink before and after the scan.  Criteria questions for Lung Cancer Screening:   Are you a current or former smoker? Current Age began smoking: 53 yo   If you are a former smoker, what year did you quit smoking? NA   To calculate your smoking history, I need an accurate estimate of how many packs of cigarettes you smoked per day and for how many years. (Not just the number of PPD you are now smoking)   Years smoking 39 x Packs per day 1 = Pack years 39   (at least 20 pack yrs)   (Make sure they understand that we need to know how much they have smoked in the past, not just the number of PPD they are smoking now)  Do you have a personal history of cancer?  Yes - (type and when diagnosed - 5 yrs cancer free) Uterine age 19 yo    Do you have a family history of cancer? Yes  (cancer type and and relative) mat grandpa/ lung and 4 mat uncles/lung  Are you coughing up blood?  No  Have you had unexplained weight loss of 15 lbs or more in the last 6 months? No  It looks like you meet all criteria.     Additional information: N/A

## 2023-08-23 ENCOUNTER — Other Ambulatory Visit: Payer: Self-pay | Admitting: Internal Medicine

## 2023-08-23 DIAGNOSIS — F333 Major depressive disorder, recurrent, severe with psychotic symptoms: Secondary | ICD-10-CM

## 2023-08-23 DIAGNOSIS — F5104 Psychophysiologic insomnia: Secondary | ICD-10-CM

## 2023-08-23 DIAGNOSIS — F4000 Agoraphobia, unspecified: Secondary | ICD-10-CM

## 2023-08-23 DIAGNOSIS — F4310 Post-traumatic stress disorder, unspecified: Secondary | ICD-10-CM

## 2023-08-23 DIAGNOSIS — F411 Generalized anxiety disorder: Secondary | ICD-10-CM

## 2023-08-27 ENCOUNTER — Telehealth: Payer: Self-pay | Admitting: Internal Medicine

## 2023-08-27 NOTE — Telephone Encounter (Signed)
 [facility] sent Fax for 02 order.  Needing signature. I put in Dr. Jacqui Mau box to sign. He is out this week so it may take some time.

## 2023-08-28 ENCOUNTER — Ambulatory Visit: Payer: Self-pay

## 2023-08-28 ENCOUNTER — Other Ambulatory Visit: Payer: Self-pay | Admitting: Internal Medicine

## 2023-08-28 DIAGNOSIS — Z720 Tobacco use: Secondary | ICD-10-CM

## 2023-08-28 NOTE — Progress Notes (Unsigned)
 Referring Provider: Billie Lade, MD Primary Care Physician:  Billie Lade, MD Primary GI Physician: Dr. Marletta Lor  Chief Complaint  Patient presents with   Follow-up    Follow up. No problems, but gaining weight.     HPI:   [name] is a 53 y.o. female with history of GERD, Candida esophagitis, chronic nausea, chronic constipation, chronic abdominal pain, colitis on CTs previously. She has been empirically treated for IBD/suspected Crohn's with prednisone and Entocort but has also been treated with antibiotics. It is unclear if she truly has IBD, but has been suspected to be more IBS. Follow-up colonoscopy February 2023 (off steroids for several months) with normal exam aside from nonbleeding internal hemorrhoids and normal colon biopsies.    Previously admitted in April 2024 after presenting with worsening abdominal pain and found to have sepsis in the setting of bacteremia, pyelonephritis, pneumonia, concern for colitis on CT.  C. difficile and GI pathogen panel were negative.  She was treated empirically with antibiotics.  Recommended outpatient colonoscopy. She was also found to have anemia with hemoglobin 8.4 on admission, down from 13.2 in January 2024, heme-negative, but iron and iron saturation low.  Hemoglobin declined as low as 6.5 with no overt GI bleeding.  She has been taking 1-2 BC powders daily for headaches outpatient.  She received 2 units PRBCs and hemoglobin improved to 12.5, 10.5 at the time of discharge.  We had initially considered EGD inpatient, but echocardiogram revealed EF of 25-30% with circumferential pericardial effusion.  Recommended against inpatient procedures.  Could consider outpatient once medically improved.   Repeat echo 5/21 with EF improved to 60-65%.    Last seen in office 11/30/2022.  She felt that her colitis was flaring again.  Reported having frequent watery diarrhea, primarily upper abdominal pain for the last 2 days, consistent with her  prior flares.  Reported prior to this, she had been doing well with 1-2 formed stools daily with Bentyl 20 mg twice daily.  Had not really been having any abdominal pain.  Her chronic nausea was well-controlled with Zofran and Phenergan.  She denies any recent antibiotics, travel, medication changes, sick contacts.  Denied NSAIDs.  GERD remains well-controlled. She was taking PPI BID and denied NISAD use (discontinued BC powders).  Unfortunately, due to global epic/computer outage, we were unable to obtain stat CT.  She was started on Cipro and Flagyl empirically for suspected colitis.  Labs and stool studies were ordered and advised patient to have them completed ASAP.  Would need colonoscopy and EGD once acute symptoms resolved for follow-up on colitis and IDA.    Labs/stool studies not completed.    Today:  Abdominal pain/diarrhea/history of colitis:  Doing well at this time. No real abdominal pain. Takes Bentyl 3 times a day.  Bowels moving well. No diarrhea. No rectal bleeding. No black stool.    GERD:  Doing well on pantoprazole BID. No dysphagia.     Chronic nausea:  Alternates zofran to phenergan depending on what is working for her. Symptoms are well controlled with medications at this time.     IDA:  Most recent labs 06/22/2023 with hemoglobin 11.4, normocytic indices. She is not taking iron.  Denies NSAIDs.  No overt GI bleeding.     Elevated alkaline phosphatase/LFTs: Intermittent alk phos elevation dating back to 2021.  LFTs have also been intermittently elevated. Most recent labs 06/19/2023 with alk phos 207.  LFTs, wnl, but in January AST 51 and  ALT 47.  GGT within normal limits 06/14/2023. Hep C Ab negative September 2024.    No ETOH.  Occasional tylenol.  No illicit drug use.  No Fhx of liver diseas  No herbal supplements.  No new medications.   Has gain 30 lbs in the last 9 months. Not sure why. She is eating better, but also feels like she may have fluid retention.  Gets SOB with activity. Feels like her abdomen is distended. Planning to see PCP tomorrow. Taking Lasix and spironolactone.    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   CHF (congestive heart failure) (HCC)    Chronic abdominal pain    Chronic back pain    Chronic kidney disease, stage 1 07/04/2022   Colitis    Constipation    COPD (chronic obstructive pulmonary disease) (HCC) 04/04/2023   Depression 03/14/2023   Dyspnea    with exertion   GERD (gastroesophageal reflux disease)    Gram-negative bacteremia 09/03/2022   History of blood transfusion 08/2022   2 units transfused at [facility]   Hypertension    Insomnia    Kidney cysts    left kidney   MDD (major depressive disorder)    Migraine    Nonischemic cardiomyopathy (HCC) 06/14/2023   Oxygen dependent    2L via [place] prn 24/7   Plumbism    blood clot    Pneumonia    x several   PONV (postoperative nausea and vomiting)    PTSD (post-traumatic stress disorder)    Pyelonephritis 09/02/2022   Renal mass    Sepsis due to Escherichia coli (E. 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   Uterine cancer [facility])    age 70    Past Surgical History:  Procedure Laterality Date   ABDOMINAL HYSTERECTOMY     BACK SURGERY     BIOPSY  07/07/2021   Procedure: BIOPSY;  Surgeon: Lanelle Bal, DO;  Location: [facility];  Service: Endoscopy;;   CHOLECYSTECTOMY     Complicated by bile leak requiring ERCP with temporary stenting   COLONOSCOPY WITH ESOPHAGOGASTRODUODENOSCOPY (EGD)  07/2009   [facility]: Op notes cannot be seen through care everywhere however pathology showed terminal ileum and random colon biopsies normal.  Stomach biopsy with gastric antral and fundic mucosa with reactive foveolar hyperplasia.  No active gastritis.   Stains negative for H. pylori.   COLONOSCOPY WITH PROPOFOL N/A 07/07/2021   Surgeon: Earnest Bailey K, DO;  Non-bleeding internal hemorrhoids, normal TI  biopsied, normal colon biopsied. Pathology with colon and TI biopsies normal.   ESOPHAGEAL BRUSHING  07/07/2021   Procedure: ESOPHAGEAL BRUSHING;  Surgeon: Lanelle Bal, DO;  Location: [facility];  Service: Endoscopy;;   ESOPHAGOGASTRODUODENOSCOPY  06/2007   [facility]: Normal esophagus, gastric mucosal abnormality with erythema, biopsy showed gastric antral and fundic mucosa with reactive foveolar hyperplasia, no gastritis, no H. pylori.   ESOPHAGOGASTRODUODENOSCOPY (EGD) WITH PROPOFOL N/A 07/07/2021   Surgeon: Lanelle Bal, DO; moderately severe Candida esophagitis, gastritis, biopsies negative for H. pylori, normal examined duodenum.  Prescribed Diflucan and recommended PPI twice daily.   HAND SURGERY     OPEN REDUCTION INTERNAL FIXATION (ORIF) PROXIMAL PHALANX Right 06/19/2023   Procedure: OPEN  REDUCTION INTERNAL FIXATION (ORIF) OF RIGHT SMALL FINGER PROXIMAL PHALANX FRACTURE;  Surgeon: Rober Chimera, MD;  Location: [facility] OR;  Service: Orthopedics;  Laterality: Right;    Current Outpatient Medications  Medication Sig Dispense Refill   albuterol (VENTOLIN HFA) 108 (90 Base) MCG/ACT inhaler Inhale 2 puffs into the lungs every 4 (four) hours as needed for shortness of breath or wheezing (COPD).     atorvastatin (LIPITOR) 40 MG tablet Take 1 tablet (40 mg total) by mouth daily. 90 tablet 3   Buprenorphine HCl-Naloxone HCl 8-2 MG FILM Place 1 Film under the tongue 2 (two) times daily.     famotidine (PEPCID) 20 MG tablet Take 20 mg by mouth daily as needed (For break through heart burn).     FLUoxetine (PROZAC) 40 MG capsule Take 2 capsules (80 mg total) by mouth daily. 180 capsule 1   furosemide (LASIX) 40 MG tablet Take 1 tablet (40 mg total) by mouth daily as needed (swelling). 30 tablet 1   ipratropium-albuterol (DUONEB) 0.5-2.5 (3)  MG/3ML SOLN Take 3 mLs by nebulization every 4 (four) hours as needed (SOB, wheezing, coughing). 360 mL 1   metoprolol succinate (TOPROL-XL) 100 MG 24 hr tablet TAKE ONE TABLET BY MOUTH ONCE DAILY WITH OR IMMEDIATELY FOLLOWING A MEAL 30 tablet 3   mirtazapine (REMERON) 30 MG tablet TAKE ONE TABLET BY MOUTH AT BEDTIME 30 tablet 0   Nebulizer System All-In-One MISC 1 Device by Does not apply route as directed. 1 each 0   NICOTINE STEP 2 14 MG/24HR patch PLACE ONE PATCH ON THE SKIN DAILY 28 patch 1   nitroGLYCERIN (NITROSTAT) 0.4 MG SL tablet Place 1 tablet (0.4 mg total) under the tongue every 5 (five) minutes as needed for chest pain. (Patient taking differently: Place 0.4 mg under the tongue every 5 (five) minutes x 3 doses as needed for chest pain (if no relief after 3rd dose, proceed to ED or call 911).) 15 tablet 2   oxyCODONE (ROXICODONE) 5 MG immediate release tablet Take 1 tablet (5 mg total) by mouth every 6 (six) hours as needed for severe pain (pain score 7-10). 20 tablet 0   OXYGEN Inhale 2 L into the lungs at bedtime.     potassium chloride SA (KLOR-CON M) 20 MEQ tablet Take 1 tablet (20 mEq total) by mouth daily as needed. (Patient taking differently: Take 20 mEq by mouth daily as needed (Use with Furosemide).) 30 tablet 3   sacubitril-valsartan (ENTRESTO) 24-26 MG TAKE ONE TABLET BY MOUTH TWICE DAILY 60 tablet 6   spironolactone (ALDACTONE) 25 MG tablet TAKE ONE-HALF TABLET BY MOUTH ONCE DAILY 45 tablet 2   SYMBICORT 160-4.5 MCG/ACT inhaler Inhale 2 puffs into the lungs 2 (two) times daily.     temazepam (RESTORIL) 30 MG capsule Take 30 mg by mouth at bedtime as needed.     dicyclomine (BENTYL) 20 MG tablet Take 1 tablet (20 mg total) by mouth 3 (three) times daily before meals. 90 tablet 3   hydrOXYzine (ATARAX) 50 MG tablet TAKE ONE TABLET BY MOUTH UP TO THREE TIMES DAILY AS NEEDED FOR ANXIETY 90 tablet 2   ondansetron (ZOFRAN-ODT) 8 MG disintegrating tablet Take 1 tablet (8 mg total)  by mouth every 8 (eight) hours as needed for nausea or vomiting. 60 tablet 3   pantoprazole (PROTONIX) 40 MG tablet TAKE ONE TABLET BY MOUTH TWICE DAILY WITH FOOD (BREAKFAST AND SUPPER) 60 tablet 5   promethazine (PHENERGAN) 25 MG tablet Take 1 tablet (25  mg total) by mouth every 8 (eight) hours as needed for nausea or vomiting. 30 tablet 3   No current facility-administered medications for this visit.    Allergies as of 08/29/2023 - Review Complete 08/29/2023  Allergen Reaction Noted   Buprenorphine Other (See Comments) 01/24/2016   Penicillins Swelling and Rash 07/08/2011   Aripiprazole Other (See Comments) 08/01/2020   Seroquel [quetiapine] Other (See Comments) 08/18/2020   Cyclobenzaprine  07/04/2022   Duloxetine hcl  07/04/2022   Cefaclor Other (See Comments)    Doxycycline Rash 08/01/2020   Erythromycin Rash 07/04/2022   Gabapentin Nausea And Vomiting 01/24/2016   Ibuprofen Swelling and Rash 07/08/2011   Levofloxacin Rash 01/24/2016   Lisinopril Nausea And Vomiting 08/01/2020   Macrobid [nitrofurantoin monohyd macro] Rash 11/30/2014   Naproxen Swelling and Rash 07/08/2011   Pregabalin Rash 07/04/2022    Family History  Adopted: Yes  Problem Relation Age of Onset   Colon cancer Maternal Grandfather        72s   Colon cancer Maternal Uncle        69s   Cancer Other    Seizures Other    Stroke Other    Diabetes Other     Social History   Socioeconomic History   Marital status: Married    Spouse name: benito   Number of children: 2   Years of education: Not on file   Highest education level: Associate degree: academic program  Occupational History    Comment: not employed  Tobacco Use   Smoking status: Every Day    Current packs/day: 1.00    Average packs/day: 1 pack/day for 20.0 years (20.0 ttl pk-yrs)    Types: Cigarettes, E-cigarettes   Smokeless tobacco: Never   Tobacco comments:    Half a pack per day and utilizing nicotine patches as of 03/14/2023   Vaping Use   Vaping status: Never Used  Substance and Sexual Activity   Alcohol use: Not Currently    Comment: Quit in 2021.  See psychiatry note from 03/14/2023   Drug use: Yes    Types: Benzodiazepines, Hydrocodone    Comment: See psychiatry note from 04/09/23   Sexual activity: Yes    Birth control/protection: Surgical    Comment: Hysterectomy  Other Topics Concern   Not on file  Social History Narrative   Not on file   Social Drivers of Health   Financial Resource Strain: High Risk (05/06/2023)   Overall Financial Resource Strain (CARDIA)    Difficulty of Paying Living Expenses: Very hard  Food Insecurity: Food Insecurity Present (05/06/2023)   Hunger Vital Sign    Worried About Running Out of Food in the Last Year: Often true    Ran Out of Food in the Last Year: Sometimes true  Transportation Needs: No Transportation Needs (05/06/2023)   PRAPARE - Administrator, Civil Service (Medical): No    Lack of Transportation (Non-Medical): No  Physical Activity: Unknown (05/06/2023)   Exercise Vital Sign    Days of Exercise per Week: 0 days    Minutes of Exercise per Session: Not on file  Stress: Stress Concern Present (05/06/2023)   [employer] of Occupational Health - Occupational Stress Questionnaire    Feeling of Stress : Very much  Social Connections: Unknown (05/06/2023)   Social Connection and Isolation Panel [NHANES]    Frequency of Communication with Friends and Family: Patient declined    Frequency of Social Gatherings with Friends and Family: Never  Attends Religious Services: Never    Active Member of Clubs or Organizations: No    Attends Engineer, structural: Not on file    Marital Status: Married    Review of Systems: Gen: Denies fever, chills, cold or flulike symptoms, presyncope, syncope. CV: Denies chest pain, palpitations.. Resp: Admits to shortness of breath with little exertion.  No shortness of breath at rest.  No  cough. GI: See HPI Heme: See HPI  Physical Exam: BP (!) 112/55 (BP Location: Right Arm, Patient Position: Sitting, Cuff Size: Large)   Pulse 74   Temp (!) 96.9 F (36.1 C) (Temporal)   Ht 5\' 1"  (1.549 m)   Wt 159 lb 9.6 oz (72.4 kg)   BMI 30.16 kg/m  General:   Alert and oriented. No distress noted. Pleasant and cooperative.  Head:  Normocephalic and atraumatic. Eyes:  Conjuctiva clear without scleral icterus. Heart:  S1, S2 present without murmurs appreciated. Lungs:  Clear to auscultation bilaterally. No wheezes, rales, or rhonchi. No distress.  Abdomen:  +BS, full but soft, and non-distended. Mild generalized TTP. No rebound or guarding.  No HSM or masses noted. Msk:  Symmetrical without gross deformities. Normal posture. Extremities:  With 1+ bilateral LE edema. Neurologic:  Alert and  oriented x4 Psych:  Normal mood and affect.    Assessment:  53 y.o. female with history of GERD, Candida esophagitis, chronic nausea, chronic constipation, chronic abdominal pain, colitis on CTs previously. She has been empirically treated for IBD/suspected Crohn's with prednisone and Entocort but has also been treated with antibiotics. It is unclear if she truly has IBD, but has been suspected to be more IBS.  She is presenting today for follow-up.  Chronic abdominal pain: Well-controlled on dicyclomine 3 times daily.  Likely secondary to IBS.  Chronic diarrhea/suspected IBS/history of colitis: Well-controlled on dicyclomine 3 times daily.  Likely secondary to IBS.  IBD/Crohn's has been question in the past due to recurrent episodes of colitis seen on CTs, but no definitive diagnosis has been made and follow-up colonoscopy in February 2023 showed no colon inflammation. However, she had recurrent colitis on CT in April 2024 treated empirically and was advised to have outpatient colonoscopy which has yet to be completed. Also with suspected recurrent colitis in July 2024. No imaging at that time due  to global epic/computer outage, but treated empirically with antibiotics.   Due to to history of recurrent colitis and IDA, I would recommend repeating a colonoscopy in the near future.   GERD: Well-controlled on pantoprazole 40 mg twice daily.  Chronic nausea: Well-controlled on Zofran or Phenergan. Etiology is unclear. She has had extensive evaluation. EGD in February 2023 with Candida esophagitis, gastritis. She was treated with fluconazole. Chronic GERD well-controlled. History of cholecystectomy.  Gastric emptying study normal in 2023.   IDA: Found to have acute anemia in April 2024 while admitted with sepsis in the setting of colitis, bacteremia, pyelonephritis, and pneumonia. Hgb was 8.4 on admission, down from 13.2 in January 2024. Hemoccult negative. Iron low at 10, saturation ratio low at 4%, ferritin 31. Hemoglobin declined as low as 6.5. She received 2 units PRBCs with hemoglobin 10.5 at the time of discharge.  Hemoglobin improved as high as 12.9 in September 2024.  Most recent hemoglobin 11.4 on 06/22/2023 with normocytic indices.  She has not been taking oral iron.  Previously taking BC powders, but none since admission in April 2024.  She is compliant with PPI twice daily.  Previously recommended EGD and  colonoscopy, but this has yet to be scheduled.  At this point, will recheck hemoglobin check iron panel.  If no evidence of iron deficiency, could potentially hold off on EGD as she is doing fairly well at this time, but would still recommend a colonoscopy due to history of recurrent colitis.    Weight gain: 30 pound weight gain in the last 9 months.  May be multifactorial as patient is eating better with chronic GI symptoms under better control.  However, she may also be retaining fluid.  She does have evidence of 1+ edema in lower extremities today.  Patient also feels that her abdomen is distended.  Could be retaining fluid in the setting of CHF, but also note she has had  intermittent LFT elevation/alk phos elevation raising the concern for underlying liver disease.  Will update abdominal ultrasound.  She has follow-up with PCP tomorrow. May need to see her cardiologist had echocardiogram updated.  Will defer this to PCP tomorrow.  Elevated LFTs: Intermittent.  Primarily alk phos dating back to 2021, and persistently elevated since September 2024 in the 200 range.  MRI abdomen on file from September 2024 without significant hepatic steatosis or iron deposition.  Evidence of prior cholecystectomy with stable CBD dilation compatible with reservoir effect.  Aside from patient reporting weight gain/possible fluid retention as discussed above, no other significant symptoms.  Denies alcohol, illicit drug use, family history of liver disease, herbal supplements, new medications.  Occasional Tylenol use.  Hepatitis C antibody negative in September 2024.  GGT within normal limits in January 2025, though this is not always reliable.  At this point, we will go ahead and complete additional serologic workup.    Plan:  CBC, iron panel, HFP, ANA, AMA, ASMA, immunoglobulins, hepatitis B surface antibody, hepatitis B surface antigen, hepatitis B core antibody, hepatitis A antibody, alpha-1 antitrypsin (considering history of lung disease). Abdominal ultrasound Will arrange colonoscopy, possible upper endoscopy pending labs and any additional evaluation of weight gain planned by PCP. Likely needs to see cardiology. May need updated ECHO.  Continue dicyclomine 3 times daily Continue pantoprazole 40 mg twice daily Continue Zofran or Phenergan as needed Follow-up in 3 months.   Shana Daring, PA-C [facility] 08/29/2023

## 2023-08-28 NOTE — Telephone Encounter (Signed)
 Copied from CRM [id number]. Topic: Clinical - Red Word Triage >> Aug 28, 2023 10:13 AM [name] wrote: Red Word that prompted transfer to Nurse Triage: feet and legs swollen, SOB when doing day to day things, weight gain  Chief Complaint: leg swelling Symptoms: leg swelling up to knees, painful and tender, weight gain per pt 40 lbs in 6 months, SOB at times with activity  Frequency: this week  Pertinent Negatives: Patient denies SOB currently or with rest  Disposition: [] ED /[] Urgent Care (no appt availability in office) / [x] Appointment(In office/virtual)/ []  [facility]/ [] Home Care/ [] Refused Recommended Disposition /[] [facility]/ []  Follow-up with PCP Additional Notes: pt has extensive medical hx and states she is wanting to be seen for sx since she hasn't been seen in a while. Is still taking diuretics as prescribed. No appts today, scheduled for 1st available tomorrow at 0920 and recommended if sx get worse before then to go to ED. Pt verbalized understanding. PCP next appt wasn't until 08/30/23 and recommended pt not wait until then to be seen.   Reason for Disposition  [1] MODERATE leg swelling (e.g., swelling extends up to knees) AND [2] new-onset or worsening  Answer Assessment - Initial Assessment Questions 1. ONSET: "When did the swelling start?" (e.g., minutes, hours, days)     This week  2. LOCATION: "What part of the leg is swollen?"  "Are both legs swollen or just one leg?"     Both legs up to knees  3. SEVERITY: "How bad is the swelling?" (e.g., localized; mild, moderate, severe)   - Localized: Small area of swelling localized to one leg.   - MILD pedal edema: Swelling limited to foot and ankle, pitting edema < 1/4 inch (6 mm) deep, rest and elevation eliminate most or all swelling.   - MODERATE edema: Swelling of lower leg to knee, pitting edema > 1/4 inch (6 mm) deep, rest and elevation only partially reduce swelling.   - SEVERE edema: Swelling extends  above knee, facial or hand swelling present.      Moderate feeling tight and painful  4. REDNESS: "Does the swelling look red or infected?"     no 5. PAIN: "Is the swelling painful to touch?" If Yes, ask: "How painful is it?"   (Scale 1-10; mild, moderate or severe)     Yes at times  8. MEDICAL HISTORY: "Do you have a history of blood clots (e.g., DVT), cancer, heart failure, kidney disease, or liver failure?"     Chf and kidney disease and COPD  10. OTHER SYMPTOMS: "Do you have any other symptoms?" (e.g., chest pain, difficulty breathing)       Weight gain 40 lbs 6 months, SOB with activity  Protocols used: Leg Swelling and Edema-A-AH

## 2023-08-28 NOTE — Telephone Encounter (Signed)
Noted appointment made

## 2023-08-29 ENCOUNTER — Encounter: Payer: Self-pay | Admitting: *Deleted

## 2023-08-29 ENCOUNTER — Ambulatory Visit: Payer: Self-pay | Admitting: Family Medicine

## 2023-08-29 ENCOUNTER — Encounter: Payer: Self-pay | Admitting: Gastroenterology

## 2023-08-29 ENCOUNTER — Ambulatory Visit (INDEPENDENT_AMBULATORY_CARE_PROVIDER_SITE_OTHER): Admitting: Gastroenterology

## 2023-08-29 VITALS — BP 112/55 | HR 74 | Temp 96.9°F | Ht 61.0 in | Wt 159.6 lb

## 2023-08-29 DIAGNOSIS — R109 Unspecified abdominal pain: Secondary | ICD-10-CM | POA: Diagnosis not present

## 2023-08-29 DIAGNOSIS — R112 Nausea with vomiting, unspecified: Secondary | ICD-10-CM

## 2023-08-29 DIAGNOSIS — R7989 Other specified abnormal findings of blood chemistry: Secondary | ICD-10-CM | POA: Diagnosis not present

## 2023-08-29 DIAGNOSIS — R197 Diarrhea, unspecified: Secondary | ICD-10-CM

## 2023-08-29 DIAGNOSIS — R110 Nausea: Secondary | ICD-10-CM | POA: Diagnosis not present

## 2023-08-29 DIAGNOSIS — K219 Gastro-esophageal reflux disease without esophagitis: Secondary | ICD-10-CM

## 2023-08-29 DIAGNOSIS — R1084 Generalized abdominal pain: Secondary | ICD-10-CM

## 2023-08-29 DIAGNOSIS — K529 Noninfective gastroenteritis and colitis, unspecified: Secondary | ICD-10-CM

## 2023-08-29 DIAGNOSIS — D509 Iron deficiency anemia, unspecified: Secondary | ICD-10-CM

## 2023-08-29 DIAGNOSIS — G8929 Other chronic pain: Secondary | ICD-10-CM

## 2023-08-29 DIAGNOSIS — Z8719 Personal history of other diseases of the digestive system: Secondary | ICD-10-CM

## 2023-08-29 DIAGNOSIS — R635 Abnormal weight gain: Secondary | ICD-10-CM

## 2023-08-29 MED ORDER — PROMETHAZINE HCL 25 MG PO TABS: 25.0000 mg | ORAL_TABLET | Freq: Three times a day (TID) | ORAL | 3 refills | Status: DC | PRN

## 2023-08-29 MED ORDER — DICYCLOMINE HCL 20 MG PO TABS: 20.0000 mg | ORAL_TABLET | Freq: Three times a day (TID) | ORAL | 3 refills | Status: DC

## 2023-08-29 MED ORDER — ONDANSETRON 8 MG PO TBDP: 8.0000 mg | ORAL_TABLET | Freq: Three times a day (TID) | ORAL | 3 refills | Status: DC | PRN

## 2023-08-29 MED ORDER — PANTOPRAZOLE SODIUM 40 MG PO TBEC: DELAYED_RELEASE_TABLET | ORAL | 5 refills | Status: DC

## 2023-08-29 NOTE — Patient Instructions (Signed)
 Please have labs completed at [facility] or [facility].  We will arrange to have an ultrasound of your abdomen.  Otherwise, continue all other chronic medications as you are currently taking them as they are working very well for you.  It was great to see you today!  I will plan to see you back in 3 months or sooner if needed.  Shana Daring, PA-C [facility]

## 2023-08-30 ENCOUNTER — Ambulatory Visit: Admitting: Family Medicine

## 2023-09-03 ENCOUNTER — Encounter: Payer: Self-pay | Admitting: Internal Medicine

## 2023-09-12 ENCOUNTER — Ambulatory Visit: Payer: Self-pay | Admitting: Internal Medicine

## 2023-09-12 ENCOUNTER — Ambulatory Visit (HOSPITAL_COMMUNITY): Attending: Gastroenterology

## 2023-09-12 NOTE — Telephone Encounter (Signed)
 Patient advised to go to ED .

## 2023-09-12 NOTE — Telephone Encounter (Signed)
 Chief Complaint: BLE edema Symptoms: weight gain, mild SOB, some CP Frequency: 1-2 weeks Pertinent Negatives: Patient denies fever, URI sx, severe SOB Disposition: [x] ED /[] Urgent Care (no appt availability in office) / [] Appointment(In office/virtual)/ []  [facility]/ [] Home Care/ [] Refused Recommended Disposition /[] [facility]/ []  Follow-up with PCP Additional Notes: Pt c/o BLE edema, mild SOB/CP and weight gain. 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 Pt takes Lasix  and potassium as instructed without any improvement. Pt has also been needing rescue INH that has provided some relief. Pt endorsed CHF/PNA hospitalization in the past. Based on attempt to address sx with PRN medications, [facility] advised ED to further evaluate/treat. Patient verbalized understanding and to have husband drive to ED.    Copied from CRM [id number]. Topic: Clinical - Red Word Triage >> Sep 12, 2023  1:56 PM Donald Frost wrote: Red Word that prompted transfer to Nurse Triage: The patient called in stating she has had some symptoms and needs to see her provider. She says she has had swelling in her feet and ankles, severe fatigue and shortness of breath. With this I will transfer her to E2C2 NT Reason for Disposition  Difficulty breathing  Answer Assessment - Initial Assessment Questions E2C2 Pulmonary Triage - Initial Assessment Questions "Chief Complaint (e.g., cough, sob, wheezing, fever, chills, sweat or additional symptoms) *Go to specific symptom protocol after initial questions. SOB - sx came before swelling Endorses weight gain  "How long have symptoms been present?" X 1-2 week  Have you tested for COVID or Flu? Note: If not, ask patient if a home test can be taken. If so, instruct patient to call back for positive results. No  MEDICINES:   "Have you used any OTC meds to help with symptoms?" No If yes, ask "What medications?" N/a  "Have you used your  inhalers/maintenance medication?" Yes If yes, "What medications?" Symbicort  - 2 puffs twice daily Albuterol  - last used 1 hour ago - with some improvement DuoNeb - used last week  If inhaler, ask "How many puffs and how often?" Note: Review instructions on medication in the chart. See above  OXYGEN : "Do you wear supplemental oxygen ?" Yes If yes, "How many liters are you supposed to use?" PRN during day, and bedtime 2L at rest, 4L with exertion  "Do you monitor your oxygen  levels?" No If yes, "What is your reading (oxygen  level) today?" Does not have pulse ox    1. LOCATION: "Which ankle is swollen?" "Where is the swelling?"     Bilateral Reports taking "water pill" and potassium with no relief 2. ONSET: "When did the swelling start?"     X 4-5 days 3. SWELLING: "How bad is the swelling?" Or, "How large is it?" (e.g., mild, moderate, severe; size of localized swelling)    - NONE: No joint swelling.   - LOCALIZED: Localized; small area of puffy or swollen skin (e.g., insect bite, skin irritation).   - MILD: Joint looks or feels mildly swollen or puffy.   - MODERATE: Swollen; interferes with normal activities (e.g., work or school); decreased range of movement; may be limping.   - SEVERE: Very swollen; can't move swollen joint at all; limping a lot or unable to walk.     Moderate - up to knees 4. PAIN: "Is there any pain?" If Yes, ask: "How bad is it?" (Scale 1-10; or mild, moderate, severe)   - NONE (0): no pain.   - MILD (1-3): doesn't interfere with normal activities.    -  MODERATE (4-7): interferes with normal activities (e.g., work or school) or awakens from sleep, limping.    - SEVERE (8-10): excruciating pain, unable to do any normal activities, unable to walk.      "Feels like a bruise 5. CAUSE: "What do you think caused the ankle swelling?"     unknown 6. OTHER SYMPTOMS: "Do you have any other symptoms?" (e.g., fever, chest pain, difficulty breathing, calf pain)      SOB, some calf pain r/t swelling Denies CP  Answer Assessment - Initial Assessment Questions 3. PATTERN "Does the difficult breathing come and go, or has it been constant since it started?"      intermittent 4. SEVERITY: "How bad is your breathing?" (e.g., mild, moderate, severe)    - MILD: No SOB at rest, mild SOB with walking, speaks normally in sentences, can lie down, no retractions, pulse < 100.    - MODERATE: SOB at rest, SOB with minimal exertion and prefers to sit, cannot lie down flat, speaks in phrases, mild retractions, audible wheezing, pulse 100-120.    - SEVERE: Very SOB at rest, speaks in single words, struggling to breathe, sitting hunched forward, retractions, pulse > 120      Mild - with exertion 5. RECURRENT SYMPTOM: "Have you had difficulty breathing before?" If Yes, ask: "When was the last time?" and "What happened that time?"      Yes, reports CHF/PNA hospitalization x 8 days 6. CARDIAC HISTORY: "Do you have any history of heart disease?" (e.g., heart attack, angina, bypass surgery, angioplasty)      CHF - pt of [facility], last visit last month 7. LUNG HISTORY: "Do you have any history of lung disease?"  (e.g., pulmonary embolus, asthma, emphysema)     COPD 8. CAUSE: "What do you think is causing the breathing problem?"      CHF 9. OTHER SYMPTOMS: "Do you have any other symptoms? (e.g., dizziness, runny nose, cough, chest pain, fever)    "A little bit" of CP, and dizziness - resolved with O2  Protocols used: Ankle Swelling-A-AH, Breathing Difficulty-A-AH

## 2023-09-13 DIAGNOSIS — J449 Chronic obstructive pulmonary disease, unspecified: Secondary | ICD-10-CM | POA: Diagnosis not present

## 2023-09-13 NOTE — Telephone Encounter (Signed)
Routing to Dr. Wert as an FYI. 

## 2023-09-19 ENCOUNTER — Other Ambulatory Visit: Payer: Self-pay | Admitting: Internal Medicine

## 2023-09-19 DIAGNOSIS — F411 Generalized anxiety disorder: Secondary | ICD-10-CM

## 2023-09-19 DIAGNOSIS — F4310 Post-traumatic stress disorder, unspecified: Secondary | ICD-10-CM

## 2023-09-19 DIAGNOSIS — F5104 Psychophysiologic insomnia: Secondary | ICD-10-CM

## 2023-09-19 DIAGNOSIS — F4000 Agoraphobia, unspecified: Secondary | ICD-10-CM

## 2023-09-19 DIAGNOSIS — F333 Major depressive disorder, recurrent, severe with psychotic symptoms: Secondary | ICD-10-CM

## 2023-09-20 ENCOUNTER — Ambulatory Visit: Admitting: Acute Care

## 2023-09-20 ENCOUNTER — Encounter: Payer: Self-pay | Admitting: Acute Care

## 2023-09-20 DIAGNOSIS — F17210 Nicotine dependence, cigarettes, uncomplicated: Secondary | ICD-10-CM

## 2023-09-20 NOTE — Progress Notes (Signed)
 Virtual Visit via Telephone Note  I connected with [name] on 09/20/23 at 11:30 AM EDT by telephone and verified that I am speaking with the correct person using two identifiers.  Location: Patient:  At home Provider: [address] [place], [place], [address]    I discussed the limitations, risks, security and privacy concerns of performing an evaluation and management service by telephone and the availability of in person appointments. I also discussed with the patient that there may be a patient responsible charge related to this service. The patient expressed understanding and agreed to proceed.    Shared Decision Making Visit Lung Cancer Screening Program [phone])   Eligibility: Age 54 y.o. Pack Years Smoking History Calculation 39 pack years  (# packs/per year x # years smoked) Recent History of coughing up blood  no Unexplained weight loss? no ( >Than 15 pounds within the last 6 months ) Prior History Lung / other cancer no (Diagnosis within the last 5 years already requiring surveillance chest CT Scans). Smoking Status Current Smoker Former Smokers: Years since quit: NA  Quit Date: NA  Visit Components: Discussion included one or more decision making aids. yes Discussion included risk/benefits of screening. yes Discussion included potential follow up diagnostic testing for abnormal scans. yes Discussion included meaning and risk of over diagnosis. yes Discussion included meaning and risk of False Positives. yes Discussion included meaning of total radiation exposure. yes  Counseling Included: Importance of adherence to annual lung cancer LDCT screening. yes Impact of comorbidities on ability to participate in the program. yes Ability and willingness to under diagnostic treatment. yes  Smoking Cessation Counseling: Current Smokers:  Discussed importance of smoking cessation. yes Information about tobacco cessation classes and interventions provided to  patient. yes Patient provided with "ticket" for LDCT Scan. NA Symptomatic Patient. no  Counseling NA Diagnosis Code: Tobacco Use Z72.0 Asymptomatic Patient yes  Counseling (Intermediate counseling: > three minutes counseling) W0981 Former Smokers:  Discussed the importance of maintaining cigarette abstinence. yes Diagnosis Code: Personal History of Nicotine  Dependence. X91.478 Information about tobacco cessation classes and interventions provided to patient. Yes Patient provided with "ticket" for LDCT Scan. yes Written Order for Lung Cancer Screening with LDCT placed in Epic. Yes (CT Chest Lung Cancer Screening Low Dose W/O CM) [id number] Z12.2-Screening of respiratory organs Z87.891-Personal history of nicotine  dependence  Working on quitting, using patches.  Counseled x 3-4 minutes on smoking cessation.  Resources Provided, see AVS     Raejean Bullock, NP 09/20/2023

## 2023-09-20 NOTE — Patient Instructions (Signed)

## 2023-09-23 ENCOUNTER — Ambulatory Visit (HOSPITAL_COMMUNITY)

## 2023-09-27 ENCOUNTER — Ambulatory Visit (HOSPITAL_COMMUNITY)
Admission: RE | Admit: 2023-09-27 | Discharge: 2023-09-27 | Disposition: A | Discharge status: Home / Self Care | Source: Ambulatory Visit | Attending: Acute Care | Admitting: Acute Care

## 2023-09-27 DIAGNOSIS — F17210 Nicotine dependence, cigarettes, uncomplicated: Secondary | ICD-10-CM | POA: Diagnosis present

## 2023-09-27 DIAGNOSIS — Z122 Encounter for screening for malignant neoplasm of respiratory organs: Secondary | ICD-10-CM | POA: Diagnosis present

## 2023-09-27 DIAGNOSIS — Z87891 Personal history of nicotine dependence: Secondary | ICD-10-CM | POA: Insufficient documentation

## 2023-09-30 ENCOUNTER — Telehealth: Payer: Self-pay | Admitting: Internal Medicine

## 2023-09-30 MED ORDER — ALBUTEROL SULFATE HFA 108 (90 BASE) MCG/ACT IN AERS: 2.0000 | INHALATION_SPRAY | Freq: Four times a day (QID) | RESPIRATORY_TRACT | 2 refills | Status: DC | PRN

## 2023-09-30 NOTE — Telephone Encounter (Signed)
 [facility] phone [phone]  fax [phone]---refill for Ventolin  HFA MDI--inhale one puff(s) by mouth every 4 hours as needed---last filled 04/19/2023

## 2023-09-30 NOTE — Telephone Encounter (Signed)
 Rx sent to pharmacy

## 2023-10-04 ENCOUNTER — Ambulatory Visit: Payer: Medicaid Other | Admitting: Nurse Practitioner

## 2023-10-08 ENCOUNTER — Other Ambulatory Visit: Payer: Self-pay | Admitting: Gastroenterology

## 2023-10-08 DIAGNOSIS — Z79891 Long term (current) use of opiate analgesic: Secondary | ICD-10-CM | POA: Diagnosis not present

## 2023-10-08 DIAGNOSIS — R112 Nausea with vomiting, unspecified: Secondary | ICD-10-CM

## 2023-10-09 ENCOUNTER — Other Ambulatory Visit: Payer: Self-pay | Admitting: Nurse Practitioner

## 2023-10-14 DIAGNOSIS — J449 Chronic obstructive pulmonary disease, unspecified: Secondary | ICD-10-CM | POA: Diagnosis not present

## 2023-10-16 ENCOUNTER — Other Ambulatory Visit: Payer: Self-pay | Admitting: Internal Medicine

## 2023-10-16 DIAGNOSIS — F4000 Agoraphobia, unspecified: Secondary | ICD-10-CM

## 2023-10-16 DIAGNOSIS — F333 Major depressive disorder, recurrent, severe with psychotic symptoms: Secondary | ICD-10-CM

## 2023-10-16 DIAGNOSIS — F411 Generalized anxiety disorder: Secondary | ICD-10-CM

## 2023-10-16 DIAGNOSIS — F4310 Post-traumatic stress disorder, unspecified: Secondary | ICD-10-CM

## 2023-10-16 DIAGNOSIS — F5104 Psychophysiologic insomnia: Secondary | ICD-10-CM

## 2023-10-18 ENCOUNTER — Other Ambulatory Visit: Payer: Self-pay | Admitting: Acute Care

## 2023-10-18 DIAGNOSIS — Z87891 Personal history of nicotine dependence: Secondary | ICD-10-CM

## 2023-10-18 DIAGNOSIS — F17210 Nicotine dependence, cigarettes, uncomplicated: Secondary | ICD-10-CM

## 2023-10-18 DIAGNOSIS — Z122 Encounter for screening for malignant neoplasm of respiratory organs: Secondary | ICD-10-CM

## 2023-10-21 ENCOUNTER — Other Ambulatory Visit: Payer: Self-pay | Admitting: Nurse Practitioner

## 2023-10-28 ENCOUNTER — Other Ambulatory Visit: Payer: Self-pay | Admitting: Gastroenterology

## 2023-10-28 DIAGNOSIS — R112 Nausea with vomiting, unspecified: Secondary | ICD-10-CM

## 2023-11-04 ENCOUNTER — Telehealth: Payer: Self-pay | Admitting: Nurse Practitioner

## 2023-11-04 ENCOUNTER — Encounter: Payer: Self-pay | Admitting: Gastroenterology

## 2023-11-04 DIAGNOSIS — I5032 Chronic diastolic (congestive) heart failure: Secondary | ICD-10-CM

## 2023-11-04 DIAGNOSIS — N182 Chronic kidney disease, stage 2 (mild): Secondary | ICD-10-CM

## 2023-11-04 NOTE — Telephone Encounter (Signed)
 SOB and exhausted and gaining fluid/ swelling  Currently 160  Patient states she has gained 20 lbs since February  Patient has been taking potassium lasix  40 Mg daily (3 days)  and spironolactone  12.5 daily  Some swelling has went down a little since taking both  Patient states nothing is helping such as elevation of legs  Patient has not had any labs done recently  Advised patient to decrease salt intake and make sure to keep legs elevated.  Will route to provider for advice

## 2023-11-04 NOTE — Telephone Encounter (Signed)
 Patient informed and verbalized understanding of plan. Labs printed for patient to pick up

## 2023-11-04 NOTE — Telephone Encounter (Signed)
 Pt c/o swelling/edema: STAT if pt has developed SOB within 24 hours  If swelling, where is the swelling located? Legs/feet  How much weight have you gained and in what time span? 11 pounds since Feb Have you gained 2 pounds in a day or 5 pounds in a week? Not sure  Do you have a log of your daily weights (if so, list)? No   Are you currently taking a fluid pill? yes  Are you currently SOB? On exertion  Have you traveled recently in a car or plane for an extended period of time? no

## 2023-11-05 ENCOUNTER — Telehealth: Payer: Self-pay | Admitting: Internal Medicine

## 2023-11-05 DIAGNOSIS — Z79891 Long term (current) use of opiate analgesic: Secondary | ICD-10-CM | POA: Diagnosis not present

## 2023-11-05 NOTE — Telephone Encounter (Signed)
 Pt c/o of Chest Pain: STAT if active CP, including tightness, pressure, jaw pain, radiating pain to shoulder/upper arm/back, CP unrelieved by Nitro. Symptoms reported of SOB, nausea, vomiting, sweating.   1. Are you having CP right now? yes     2. Are you experiencing any other symptoms (ex. SOB, nausea, vomiting, sweating)? Lightheaded, exhausted, heavy feeling on her chest  Asked her about SOB,Nausea, and Vomiting she said yes but she takes medicine for all that     3. Is your CP continuous or coming and going? Comes and goes      4. Have you taken Nitroglycerin ? Yes      5. How long have you been experiencing CP? Since feb     6. If NO CP at time of call then end call with telling Pt to call back or call 911 if Chest pain returns prior to return call from triage team. Pain  Pt has been taking nitroglycerin   a couple times a week due to chest pain

## 2023-11-05 NOTE — Telephone Encounter (Signed)
 Per peck yesterday  If symptoms persist and/or worsen, recommend ED evaluation. Please schedule office visit for first available appointment with me or an APP for further evaluation of symptoms.   Patient was just here to pick up lab work from yesterday advised patient if her symptoms worsen to be seen by the ED patient was scheduled for first available in the office

## 2023-11-13 DIAGNOSIS — J449 Chronic obstructive pulmonary disease, unspecified: Secondary | ICD-10-CM | POA: Diagnosis not present

## 2023-11-14 ENCOUNTER — Encounter: Payer: Self-pay | Admitting: Internal Medicine

## 2023-11-15 ENCOUNTER — Other Ambulatory Visit: Payer: Self-pay | Admitting: Nurse Practitioner

## 2023-11-20 ENCOUNTER — Other Ambulatory Visit: Payer: Self-pay | Admitting: Internal Medicine

## 2023-11-20 DIAGNOSIS — F4310 Post-traumatic stress disorder, unspecified: Secondary | ICD-10-CM

## 2023-11-20 DIAGNOSIS — F5104 Psychophysiologic insomnia: Secondary | ICD-10-CM

## 2023-11-20 DIAGNOSIS — F411 Generalized anxiety disorder: Secondary | ICD-10-CM

## 2023-11-20 DIAGNOSIS — F4000 Agoraphobia, unspecified: Secondary | ICD-10-CM

## 2023-11-20 DIAGNOSIS — F333 Major depressive disorder, recurrent, severe with psychotic symptoms: Secondary | ICD-10-CM

## 2023-11-25 ENCOUNTER — Other Ambulatory Visit: Payer: Self-pay | Admitting: Internal Medicine

## 2023-11-25 DIAGNOSIS — Z720 Tobacco use: Secondary | ICD-10-CM

## 2023-12-03 DIAGNOSIS — Z79891 Long term (current) use of opiate analgesic: Secondary | ICD-10-CM | POA: Diagnosis not present

## 2023-12-06 ENCOUNTER — Encounter: Admitting: Nurse Practitioner

## 2023-12-09 ENCOUNTER — Other Ambulatory Visit: Payer: Self-pay | Admitting: Gastroenterology

## 2023-12-09 DIAGNOSIS — Z0120 Encounter for dental examination and cleaning without abnormal findings: Secondary | ICD-10-CM | POA: Diagnosis not present

## 2023-12-09 DIAGNOSIS — R112 Nausea with vomiting, unspecified: Secondary | ICD-10-CM

## 2023-12-12 ENCOUNTER — Ambulatory Visit: Payer: Medicaid Other | Admitting: Internal Medicine

## 2023-12-14 DIAGNOSIS — J449 Chronic obstructive pulmonary disease, unspecified: Secondary | ICD-10-CM | POA: Diagnosis not present

## 2023-12-18 ENCOUNTER — Encounter: Admitting: Nurse Practitioner

## 2023-12-19 ENCOUNTER — Other Ambulatory Visit: Payer: Self-pay

## 2023-12-19 ENCOUNTER — Other Ambulatory Visit: Payer: Self-pay | Admitting: Internal Medicine

## 2023-12-19 DIAGNOSIS — F333 Major depressive disorder, recurrent, severe with psychotic symptoms: Secondary | ICD-10-CM

## 2023-12-19 DIAGNOSIS — F4000 Agoraphobia, unspecified: Secondary | ICD-10-CM

## 2023-12-19 DIAGNOSIS — F4310 Post-traumatic stress disorder, unspecified: Secondary | ICD-10-CM

## 2023-12-19 DIAGNOSIS — F411 Generalized anxiety disorder: Secondary | ICD-10-CM

## 2023-12-19 DIAGNOSIS — F5104 Psychophysiologic insomnia: Secondary | ICD-10-CM

## 2023-12-24 ENCOUNTER — Ambulatory Visit

## 2023-12-30 ENCOUNTER — Other Ambulatory Visit: Payer: Self-pay | Admitting: Internal Medicine

## 2023-12-31 ENCOUNTER — Encounter: Payer: Self-pay | Admitting: Nurse Practitioner

## 2023-12-31 ENCOUNTER — Ambulatory Visit: Attending: Nurse Practitioner | Admitting: Nurse Practitioner

## 2023-12-31 VITALS — BP 132/64 | HR 69 | Ht 61.0 in | Wt 160.6 lb

## 2023-12-31 DIAGNOSIS — N281 Cyst of kidney, acquired: Secondary | ICD-10-CM | POA: Insufficient documentation

## 2023-12-31 DIAGNOSIS — I5032 Chronic diastolic (congestive) heart failure: Secondary | ICD-10-CM | POA: Diagnosis not present

## 2023-12-31 DIAGNOSIS — I10 Essential (primary) hypertension: Secondary | ICD-10-CM | POA: Insufficient documentation

## 2023-12-31 DIAGNOSIS — Z720 Tobacco use: Secondary | ICD-10-CM | POA: Insufficient documentation

## 2023-12-31 DIAGNOSIS — I428 Other cardiomyopathies: Secondary | ICD-10-CM | POA: Diagnosis not present

## 2023-12-31 DIAGNOSIS — R5383 Other fatigue: Secondary | ICD-10-CM | POA: Insufficient documentation

## 2023-12-31 DIAGNOSIS — G4700 Insomnia, unspecified: Secondary | ICD-10-CM | POA: Diagnosis not present

## 2023-12-31 DIAGNOSIS — E038 Other specified hypothyroidism: Secondary | ICD-10-CM | POA: Diagnosis not present

## 2023-12-31 NOTE — Patient Instructions (Signed)
 Medication Instructions:   Increase Lasix  to 60 mg Daily for 5 days, Then return to 40 mg Daily   *If you need a refill on your cardiac medications before your next appointment, please call your pharmacy*  Lab Work: Your physician recommends that you return for lab work in: 1 Week ( proBNP, Mg, Bmet)   If you have labs (blood work) drawn today and your tests are completely normal, you will receive your results only by: MyChart Message (if you have MyChart) OR A paper copy in the mail If you have any lab test that is abnormal or we need to change your treatment, we will call you to review the results.  Testing/Procedures: None   Follow-Up: At [facility], you and your health needs are our priority.  As part of our continuing mission to provide you with exceptional heart care, our providers are all part of one team.  This team includes your primary Cardiologist (physician) and Advanced Practice Providers or APPs (Physician Assistants and Nurse Practitioners) who all work together to provide you with the care you need, when you need it.  Your next appointment:   4 -6 week(s)  Provider:   Almarie Crate, NP    We recommend signing up for the patient portal called MyChart.  Sign up information is provided on this After Visit Summary.  MyChart is used to connect with patients for Virtual Visits (Telemedicine).  Patients are able to view lab/test results, encounter notes, upcoming appointments, etc.  Non-urgent messages can be sent to your provider as well.   To learn more about what you can do with MyChart, go to [email or web address].   Other Instructions Thank you for choosing [facility]!

## 2023-12-31 NOTE — Progress Notes (Signed)
 Cardiology Office Note:  .   Date: 12/31/2023 ID:  [name], DOB Feb 03, 1971, MRN [id number] PCP: No primary care provider on file.  [facility] Providers Cardiologist:  Monica SHAUNNA Maywood, MD    History of Present Illness: [name] is a 53 y.o. female with a PMH of HFrecEF, HTN, IBS, chronic abdominal pain/nausea, GERD, colitis, and tobacco abuse, COPD, who presents today for overdue 3 month follow-up.   Saw her on Sep 24, 2022. Doing well from a cardiac perspective. Completed IV ABX x 3 days through PICC line after returning home for recent hospital admission for severe sepsis d/t UTI, E. coli bacteremia, pyelonephritis, colonic wall thickening/ colitis, and lobar pneumonia. Updated Echo revealed recovered EF 10/03/2022.   I last saw patient on November 12, 2022 for follow-up.  Was doing well at the time.  Was noted she had a mechanical fall about 2 nights prior to office visit, fell off a stepstool and oxygen  tank landed on right foot, wonder if she has broken some toes.    07/05/2023 - Today she presents for follow-up.  She tells me she had another fall about 10 days ago and tells me she slipped in her tub in her bathroom.  She injured her right hand and has had surgery on this right hand.  She also wonders if she injured her left leg.  Chief concern today is fatigue and insomnia.  She also admits to some dyspnea on exertion with walking and going up and down stairs.  She tells me that she will sometimes go 2 to 3 days without sleeping.  STOP-BANG score 4.  She has never been tested for sleep apnea.  She says she is in between psychiatrists and PCP has deferred sleep medication to her psychiatrist. Denies any chest pain, palpitations, syncope, presyncope, dizziness, orthopnea, PND, swelling or significant weight changes, acute bleeding, or claudication.  12/31/2023 - Here for follow-up. She is wearing oxygen  via nasal cannular for this office visit and tells me she uses this as  needed, also used this as needed last year. Admits to significant fatigue. She has had several falls since I have last seen her, she broke her right hand and has had surgery for this. Follow orthopedic surgery. She admits to weight gain and swelling, feels as though she has more shortness of breath and chronic chest congestion. Taking Lasix  as needed. Seeing a therapist. Denies any chest pain, palpitations, syncope, presyncope, dizziness, orthopnea, PND, acute bleeding, or claudication.   Studies Reviewed: [name]     EKG: EKG is not ordered today.  Limited Echo 09/2022:   1. Left ventricular ejection fraction, by estimation, is 60 to 65%. The  left ventricle has normal function. The left ventricle has no regional  wall motion abnormalities. Left ventricular diastolic parameters are  indeterminate. The average left  ventricular global longitudinal strain is -21.4 %. The global longitudinal  strain is normal.   2. Right ventricular systolic function is normal. The right ventricular  size is normal.   3. Limited echo evaluate LV function   Comparison(s): Echocardiogram done 09/05/22 showed an EF of 25-30%. STOP-Bang Score:  4      Physical Exam:   VS:  BP 132/64 (BP Location: Right Arm, Cuff Size: Normal)   Pulse 69   Ht 5' 1 (1.549 m)   Wt 160 lb 9.6 oz (72.8 kg)   SpO2 100% Comment: 2 L per [employer] [facility]  BMI 30.35 kg/m  Wt Readings from Last 3 Encounters:  12/31/23 160 lb 9.6 oz (72.8 kg)  08/29/23 159 lb 9.6 oz (72.4 kg)  08/06/23 152 lb (68.9 kg)    GEN: Well nourished, well developed in no acute distress, appears fatigued NECK: No JVD; No carotid bruits CARDIAC: S1/S2, RRR, no murmurs, rubs, gallops RESPIRATORY:  Clear to auscultation without rales, wheezing or rhonchi  ABDOMEN: Soft, non-tender, non-distended EXTREMITIES: No edema, no deformity   ASSESSMENT AND PLAN: .    1. HFrecEF, nonischemic cardiomyopathy Stage C, class II-III symptoms. EF recovered to 60-65% in May 2024. Wt is  up 11 lbs from last office visit. Instructed her to increase Lasix  to 60 mg daily x 5 days, then return to 40 mg daily. Continue rest of GDMT. Not a candidate for SGLT2i d/t past hx of sepsis. Low sodium diet, fluid restriction <2L, and daily weights encouraged. Educated to contact our office for weight gain of 2 lbs overnight or 5 lbs in one week. Heart healthy diet and regular cardiovascular exercise encouraged. Will obtain proBNP, BMET, and Mag in 1 week.   2. HTN BP borderline elevated today.  Discussed SBP goal is less than 130.  She tells me that overall her BP is well-controlled per her report. Continue current medication regimen. Discussed to monitor BP at home at least 2 hours after medications and sitting for 5-10 minutes.  Given BP log and salty 6.  She will bring this back for her next follow-up visit for review.  If BP is not at goal in next 2-3 weeks, plan to increase Entresto .  Heart healthy diet and regular cardiovascular exercise encouraged. Pt will contact us  in 2-3 weeks with her BP readings.    Tobacco abuse Smoking cessation encouraged and discussed.    Complex renal cyst Follows Dr. Rachele. Continue to follow-up with Nephrology.  5.  Fatigue, insomnia, hypothyroidism Her fatigue is most likely due to not getting sleep as well as hypothyroidism noted with labs from 05/2023. Will refer her to Endocrinology. Continue to follow with PCP.  Dispo: Follow-up with Dr. Mallipeddi or APP in 4-6 weeks or sooner if anything changes.   Signed, Almarie Crate, NP

## 2024-01-13 ENCOUNTER — Encounter: Payer: Self-pay | Admitting: Internal Medicine

## 2024-01-14 ENCOUNTER — Telehealth: Payer: Self-pay | Admitting: Gastroenterology

## 2024-01-14 NOTE — Telephone Encounter (Signed)
 Received signed release of records from [employer].... records were sent 01/14/24

## 2024-01-15 ENCOUNTER — Other Ambulatory Visit: Payer: Self-pay | Admitting: Internal Medicine

## 2024-01-15 DIAGNOSIS — Z0279 Encounter for issue of other medical certificate: Secondary | ICD-10-CM

## 2024-01-17 ENCOUNTER — Telehealth: Payer: Self-pay

## 2024-01-17 DIAGNOSIS — I5032 Chronic diastolic (congestive) heart failure: Secondary | ICD-10-CM

## 2024-01-17 NOTE — Progress Notes (Signed)
 Complex Care Management Note  Care Guide Note 01/17/2024 Name: [name] MRN: [id number] DOB: 08-Mar-1971  [name] is a 53 y.o. year old female who sees No primary care provider on file. for primary care. I reached out to Kenneth JINNY Burnet by phone today to offer complex care management services.  [name] was given information about Complex Care Management services today including:   The Complex Care Management services include support from the care team which includes your Nurse Care Manager, Clinical Social Worker, or Pharmacist.  The Complex Care Management team is here to help remove barriers to the health concerns and goals most important to you. Complex Care Management services are voluntary, and the patient may decline or stop services at any time by request to their care team member.   Complex Care Management Consent Status: Patient agreed to services and verbal consent obtained.   Follow up plan:  Telephone appointment with complex care management team member scheduled for:  01/30/24 at 3:00 p.m.   Encounter Outcome:  Patient Scheduled  Dreama [facility]  [facility], [facility] VBCI Assistant Direct Dial: [phone]  Fax: [phone]

## 2024-01-20 ENCOUNTER — Encounter: Admitting: Nurse Practitioner

## 2024-01-24 ENCOUNTER — Other Ambulatory Visit: Payer: Self-pay

## 2024-01-24 MED ORDER — NITROGLYCERIN 0.4 MG SL SUBL: 0.4000 mg | SUBLINGUAL_TABLET | SUBLINGUAL | 3 refills | Status: DC | PRN

## 2024-01-26 ENCOUNTER — Other Ambulatory Visit: Payer: Self-pay | Admitting: Gastroenterology

## 2024-01-26 DIAGNOSIS — R112 Nausea with vomiting, unspecified: Secondary | ICD-10-CM

## 2024-01-28 ENCOUNTER — Ambulatory Visit (INDEPENDENT_AMBULATORY_CARE_PROVIDER_SITE_OTHER): Admitting: Nurse Practitioner

## 2024-01-28 ENCOUNTER — Encounter: Payer: Self-pay | Admitting: Nurse Practitioner

## 2024-01-28 VITALS — BP 104/64 | HR 70 | Ht 61.0 in | Wt 152.8 lb

## 2024-01-28 DIAGNOSIS — E039 Hypothyroidism, unspecified: Secondary | ICD-10-CM | POA: Diagnosis not present

## 2024-01-28 NOTE — Progress Notes (Signed)
 Endocrinology Consult Note                                         01/28/2024, 2:23 PM  Subjective:   Subjective    [name] is a 53 y.o.-year-old female patient being seen in consultation for hypothyroidism referred by Bevely Doffing, FNP.   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   CHF (congestive heart failure) (HCC)    Chronic abdominal pain    Chronic back pain    Chronic kidney disease, stage 1 07/04/2022   Colitis    Constipation    COPD (chronic obstructive pulmonary disease) (HCC) 04/04/2023   Depression 03/14/2023   Dyspnea    with exertion   GERD (gastroesophageal reflux disease)    Gram-negative bacteremia 09/03/2022   History of blood transfusion 08/2022   2 units transfused at [facility]   Hypertension    Insomnia    Kidney cysts    left kidney   MDD (major depressive disorder)    Migraine    Nonischemic cardiomyopathy (HCC) 06/14/2023   Oxygen  dependent    2L via [place] prn 24/7   Plumbism    blood clot    Pneumonia    x several   PONV (postoperative nausea and vomiting)    PTSD (post-traumatic stress disorder)    Pyelonephritis 09/02/2022   Renal mass    Sepsis due to Escherichia coli (E. 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   Uterine cancer [facility])    age 22    Past Surgical History:  Procedure Laterality Date   ABDOMINAL HYSTERECTOMY     BACK SURGERY     BIOPSY  07/07/2021   Procedure: BIOPSY;  Surgeon: Cindie Carlin POUR, DO;  [facility];  Service: Endoscopy;;   CHOLECYSTECTOMY     Complicated by bile leak requiring ERCP with temporary stenting   COLONOSCOPY WITH ESOPHAGOGASTRODUODENOSCOPY (EGD)  07/2009   [facility]: Op notes cannot be seen through care  everywhere however pathology showed terminal ileum and random colon biopsies normal.  Stomach biopsy with gastric antral and fundic mucosa with reactive foveolar hyperplasia.  No active gastritis.  Stains negative for H. pylori.   COLONOSCOPY WITH PROPOFOL  N/A 07/07/2021   Surgeon: Cindie Carlin K, DO;  Non-bleeding internal hemorrhoids, normal TI  biopsied, normal colon biopsied. Pathology with colon and TI biopsies normal.   ESOPHAGEAL BRUSHING  07/07/2021   Procedure: ESOPHAGEAL BRUSHING;  Surgeon: Cindie Carlin POUR, DO;  [facility];  Service: Endoscopy;;   ESOPHAGOGASTRODUODENOSCOPY  06/2007   [facility]: Normal esophagus, gastric mucosal abnormality with erythema, biopsy showed gastric antral and fundic mucosa with reactive foveolar hyperplasia, no gastritis, no H. pylori.   ESOPHAGOGASTRODUODENOSCOPY (EGD) WITH  PROPOFOL  N/A 07/07/2021   Surgeon: Cindie Carlin POUR, DO; moderately severe Candida esophagitis, gastritis, biopsies negative for H. pylori, normal examined duodenum.  Prescribed Diflucan  and recommended PPI twice daily.   HAND SURGERY     OPEN REDUCTION INTERNAL FIXATION (ORIF) PROXIMAL PHALANX Right 06/19/2023   Procedure: OPEN REDUCTION INTERNAL FIXATION (ORIF) OF RIGHT SMALL FINGER PROXIMAL PHALANX FRACTURE;  Surgeon: Sebastian Lenis, MD;  Location: [facility] OR;  Service: Orthopedics;  Laterality: Right;    Social History   Socioeconomic History   Marital status: Married    Spouse name: benito   Number of children: 2   Years of education: Not on file   Highest education level: Associate degree: occupational, Scientist, product/process development, or vocational program  Occupational History    Comment: not employed  Tobacco Use   Smoking status: Every Day    Current packs/day: 1.00    Average packs/day: 1 pack/day for 20.0 years (20.0 ttl pk-yrs)    Types: Cigarettes, E-cigarettes   Smokeless tobacco: Never   Tobacco comments:    Half a pack per day and utilizing nicotine  patches as of  03/14/2023  Vaping Use   Vaping status: Never Used  Substance and Sexual Activity   Alcohol use: Not Currently    Comment: Quit in 2021.  See psychiatry note from 03/14/2023   Drug use: Not Currently    Types: Benzodiazepines, Hydrocodone     Comment: See psychiatry note from 04/09/23   Sexual activity: Yes    Birth control/protection: Surgical    Comment: Hysterectomy  Other Topics Concern   Not on file  Social History Narrative   Not on file   Social Drivers of Health   Financial Resource Strain: High Risk (12/04/2023)   Overall Financial Resource Strain (CARDIA)    Difficulty of Paying Living Expenses: Very hard  Food Insecurity: Food Insecurity Present (12/04/2023)   Hunger Vital Sign    Worried About Running Out of Food in the Last Year: Often true    Ran Out of Food in the Last Year: Often true  Transportation Needs: Unmet Transportation Needs (12/04/2023)   PRAPARE - Administrator, Civil Service (Medical): Yes    Lack of Transportation (Non-Medical): No  Physical Activity: Inactive (12/04/2023)   Exercise Vital Sign    Days of Exercise per Week: 0 days    Minutes of Exercise per Session: Not on file  Stress: Stress Concern Present (12/04/2023)   [employer] of Occupational Health - Occupational Stress Questionnaire    Feeling of Stress: Very much  Social Connections: Socially Isolated (12/04/2023)   Social Connection and Isolation Panel    Frequency of Communication with Friends and Family: Never    Frequency of Social Gatherings with Friends and Family: Never    Attends Religious Services: Never    Database administrator or Organizations: No    Attends Engineer, structural: Not on file    Marital Status: Separated    Family History  Adopted: Yes  Problem Relation Age of Onset   Colon cancer Maternal Grandfather        80s   Colon cancer Maternal Uncle        2s   Cancer Other    Seizures Other    Stroke Other    Diabetes Other      Outpatient Encounter Medications as of 01/28/2024  Medication Sig   atorvastatin  (LIPITOR) 40 MG tablet Take 1 tablet (40 mg total) by mouth daily.   Buprenorphine HCl-Naloxone  HCl 8-2 MG FILM Place 1 Film under the tongue 2 (two) times daily.   dicyclomine  (BENTYL ) 20 MG tablet Take 1 tablet (20 mg total) by mouth 3 (three) times daily before meals.   famotidine  (PEPCID ) 20 MG tablet Take 20 mg by mouth daily as needed (For break through heart burn).   FLUoxetine  (PROZAC ) 40 MG capsule Take 2 capsules (80 mg total) by mouth daily.   furosemide  (LASIX ) 40 MG tablet TAKE ONE TABLET BY MOUTH ONCE DAILY AS NEEDED FOR SWELLING.   hydrOXYzine  (ATARAX ) 50 MG tablet TAKE ONE TABLET BY MOUTH UP TO THREE TIMES DAILY AS NEEDED FOR ANXIETY   ipratropium-albuterol  (DUONEB) 0.5-2.5 (3) MG/3ML SOLN Take 3 mLs by nebulization every 4 (four) hours as needed (SOB, wheezing, coughing).   metoprolol  succinate (TOPROL -XL) 100 MG 24 hr tablet TAKE ONE TABLET BY MOUTH ONCE DAILY WITH OR IMMEDIATELY FOLLOWING A MEAL   mirtazapine  (REMERON ) 30 MG tablet TAKE ONE TABLET BY MOUTH AT BEDTIME   Nebulizer System All-In-One MISC 1 Device by Does not apply route as directed.   NICOTINE  STEP 2 14 MG/24HR patch PLACE ONE PATCH ON THE SKIN DAILY   nitroGLYCERIN  (NITROSTAT ) 0.4 MG SL tablet Place 1 tablet (0.4 mg total) under the tongue every 5 (five) minutes as needed for chest pain.   ondansetron  (ZOFRAN -ODT) 8 MG disintegrating tablet TAKE ONE TABLET BY MOUTH EVERY 8 HOURS AS NEEDED FOR NAUSEA AND VOMITING   oxyCODONE  (ROXICODONE ) 5 MG immediate release tablet Take 1 tablet (5 mg total) by mouth every 6 (six) hours as needed for severe pain (pain score 7-10).   OXYGEN  Inhale 2 L into the lungs at bedtime.   pantoprazole  (PROTONIX ) 40 MG tablet TAKE ONE TABLET BY MOUTH TWICE DAILY WITH FOOD (BREAKFAST AND SUPPER)   potassium chloride  SA (KLOR-CON  M) 20 MEQ tablet Take 1 tablet (20 mEq total) by mouth daily as needed.  (Patient taking differently: Take 20 mEq by mouth daily as needed (Use with Furosemide ).)   promethazine  (PHENERGAN ) 25 MG tablet TAKE ONE TABLET BY MOUTH EVERY 8 HOURS AS NEEDED FOR NAUSEA AND VOMITING   sacubitril -valsartan  (ENTRESTO ) 24-26 MG TAKE ONE TABLET BY MOUTH TWICE DAILY   spironolactone  (ALDACTONE ) 25 MG tablet TAKE ONE-HALF TABLET BY MOUTH ONCE DAILY   SYMBICORT  160-4.5 MCG/ACT inhaler Inhale 2 puffs into the lungs 2 (two) times daily.   temazepam  (RESTORIL ) 30 MG capsule Take 30 mg by mouth at bedtime as needed.   VENTOLIN  HFA 108 (90 Base) MCG/ACT inhaler INHALE TWO PUFFS INTO THE LUNGS EVERY 6 HOURS AS NEEDED FOR SHORTNESS OF BREATH OR FOR WHEEZING   No facility-administered encounter medications on file as of 01/28/2024.    ALLERGIES: Allergies  Allergen Reactions   Buprenorphine Other (See Comments)    Chest pain/ringing in ears and feet swelling   Penicillins Swelling and Rash    All cillins    Aripiprazole Other (See Comments)    seizure   Seroquel [Quetiapine] Other (See Comments)    Seizure (08/18/20)   Cyclobenzaprine      Other Reaction(s): dizziness   Duloxetine Hcl     Other Reaction(s): Other (see comments)   Cefaclor Other (See Comments)    Muscles locked up   Doxycycline Rash   Erythromycin Rash   Gabapentin Nausea And Vomiting    dizzy   Ibuprofen Swelling and Rash   Levofloxacin  Rash    Itching   Lisinopril Nausea And Vomiting   Macrobid [Nitrofurantoin Monohyd Macro] Rash  Naproxen Swelling and Rash   Pregabalin Rash   VACCINATION STATUS:  There is no immunization history on file for this patient.   HPI   JOHNITA PALLESCHI  is a patient with the above medical history. she was diagnosed with hypothyroidism at approximate age of 89 years, not currently on any thyroid  hormone replacement therapy.   I reviewed patient's thyroid  tests:  Lab Results  Component Value Date   TSH 6.050 (H) 06/14/2023   TSH 7.630 (H) 02/11/2023   FREET4 0.78 (L)  06/14/2023   FREET4 0.90 02/11/2023     Pt describes: - weight gain - fatigue - cold intolerance - depression/anxiety - constipation - dry skin - hair loss - trouble/painful swallowing - difficulty breathing when laying down - swelling   she denies family history of thyroid  disorders.  No family history of thyroid  cancer.  No history of radiation therapy to head or neck.  No recent use of iodine supplements.  Denies use of Biotin containing supplements.  I reviewed her chart and she also has a history of HTN, IBS, GERD, COPD, CHF, tobacco use.   ROS:  Constitutional: + weight gain, + fatigue, no subjective hyperthermia, + subjective hypothermia Eyes: no blurry vision, no xerophthalmia ENT: no sore throat, no nodules palpated in throat, + dysphagia/odynophagia, no hoarseness Cardiovascular: no chest pain, + SOB, no palpitations, + leg swelling Respiratory: no cough, + SOB Gastrointestinal: no nausea/vomiting/diarrhea, + constipation Musculoskeletal: + diffuse muscle/joint aches Skin: no rashes Neurological: no tremors, no numbness, no tingling, no dizziness Psychiatric: no depression, + anxiety   Objective:   Objective     BP 104/64 (BP Location: Left Arm, Patient Position: Sitting, Cuff Size: Large)   Pulse 70   Ht 5' 1 (1.549 m)   Wt 152 lb 12.8 oz (69.3 kg)   BMI 28.87 kg/m  Wt Readings from Last 3 Encounters:  01/28/24 152 lb 12.8 oz (69.3 kg)  12/31/23 160 lb 9.6 oz (72.8 kg)  08/29/23 159 lb 9.6 oz (72.4 kg)    BP Readings from Last 3 Encounters:  01/28/24 104/64  12/31/23 132/64  08/29/23 (!) 112/55     Constitutional:  Body mass index is 28.87 kg/m., not in acute distress, ++ anxious/distracted state of mind Eyes: PERRLA, EOMI, no exophthalmos ENT: moist mucous membranes, +mild thyromegaly, no palpable nodularity, no cervical lymphadenopathy Cardiovascular: normal precordial activity, RRR, + faint murmur, no rubs/gallops, +1 edema  BLE Respiratory:  adequate breathing efforts, no gross chest deformity, Clear to auscultation bilaterally Gastrointestinal: abdomen soft, non-tender, no distension, bowel sounds present Musculoskeletal: no gross deformities, strength intact in all four extremities Skin: moist, warm, no rashes Neurological: slight tremor with left hand, deep tendon reflexes normal in BLE.   CMP ( most recent) CMP     Component Value Date/Time   NA 139 06/22/2023 1148   NA 142 06/14/2023 1403   K 3.5 06/22/2023 1148   CL 104 06/22/2023 1148   CO2 25 06/22/2023 1148   GLUCOSE 83 06/22/2023 1148   BUN 6 06/22/2023 1148   BUN 8 06/14/2023 1403   CREATININE 0.99 06/22/2023 1148   CALCIUM  8.2 (L) 06/22/2023 1148   PROT 7.5 06/19/2023 1131   PROT 6.8 06/14/2023 1403   ALBUMIN 3.2 (L) 06/19/2023 1131   ALBUMIN 3.8 06/14/2023 1403   AST 41 06/19/2023 1131   ALT 36 06/19/2023 1131   ALKPHOS 207 (H) 06/19/2023 1131   BILITOT 0.6 06/19/2023 1131   BILITOT <0.2 06/14/2023 1403  EGFR 84 06/14/2023 1403   GFRNONAA >60 06/22/2023 1148     Diabetic Labs (most recent): Lab Results  Component Value Date   HGBA1C 5.9 (H) 02/11/2023     Lipid Panel ( most recent) Lipid Panel     Component Value Date/Time   CHOL 140 02/11/2023 1410   TRIG 214 (H) 02/11/2023 1410   HDL 31 (L) 02/11/2023 1410   CHOLHDL 4.5 (H) 02/11/2023 1410   LDLCALC 73 02/11/2023 1410   LABVLDL 36 02/11/2023 1410        Assessment & Plan:   ASSESSMENT / PLAN:  1. Hypothyroidism  Patient with newly diagnosed hypothyroidism, not currently on levothyroxine  therapy. On physical exam, patient does not have gross goiter, thyroid  nodules, but she does have mild compressive symptoms, thus will check baseline thyroid  ultrasound.  - Will check thyroid  tests before next visit: TSH, free T4, and antibody testing to help classify her dysfunction.  I will call patient with results and next steps.  We did go over proper administration  just in case we start medication based on the labs.  - We discussed about correct intake of levothyroxine , at fasting, with water, separated by at least 30 minutes from breakfast, and separated by more than 4 hours from calcium , iron, multivitamins, acid reflux medications (PPIs). -Patient is made aware of the fact that thyroid  hormone replacement is needed for life, dose to be adjusted by periodic monitoring of thyroid  function tests.   - Time spent with the patient: 45 minutes,which was spent in obtaining information about her symptoms, reviewing her previous labs, evaluations, and treatments, counseling her about her hypothyroidism, and developing a plan to confirm the diagnosis and long term treatment as necessary. Please refer to Patient Self Inventory in the Media tab for reviewed elements of pertinent patient history.  Kenneth JINNY Burnet participated in the discussions, expressed understanding, and voiced agreement with the above plans.  All questions were answered to her satisfaction. she is encouraged to contact clinic should she have any questions or concerns prior to her return visit.   FOLLOW UP PLAN:  Return thyroid  labs today, will call with results and next steps.[name] Benton Rio, FNP-BC [facility] [address] [place], [place] [address] Phone: [phone] Fax: [phone]  01/28/2024, 2:23 PM

## 2024-01-28 NOTE — Patient Instructions (Signed)

## 2024-01-29 ENCOUNTER — Ambulatory Visit: Payer: Self-pay | Admitting: Nurse Practitioner

## 2024-01-29 DIAGNOSIS — E063 Autoimmune thyroiditis: Secondary | ICD-10-CM

## 2024-01-29 LAB — T4, FREE: Free T4: 0.83 ng/dL (ref 0.82–1.77)

## 2024-01-29 LAB — T3, FREE: T3, Free: 2.3 pg/mL (ref 2.0–4.4)

## 2024-01-29 LAB — TSH: TSH: 3.71 u[IU]/mL (ref 0.450–4.500)

## 2024-01-29 LAB — THYROGLOBULIN ANTIBODY: Thyroglobulin Antibody: <1 [IU]/mL (ref 0.0–0.9)

## 2024-01-29 LAB — THYROID PEROXIDASE ANTIBODY: Thyroperoxidase Ab SerPl-aCnc: 152 [IU]/mL — ABNORMAL HIGH (ref 0–34)

## 2024-01-29 MED ORDER — LEVOTHYROXINE SODIUM 25 MCG PO TABS: 25.0000 ug | ORAL_TABLET | Freq: Every day | ORAL | 1 refills | Status: DC

## 2024-01-29 NOTE — Progress Notes (Signed)
 Please schedule patient for follow up in office in 3 months with previsit labs.

## 2024-01-30 ENCOUNTER — Other Ambulatory Visit: Payer: Self-pay | Admitting: *Deleted

## 2024-01-30 NOTE — Patient Instructions (Signed)
 Visit Information  [name] was given information about Medicaid Managed Care team care coordination services as a part of their [facility] Community Plan Medicaid benefit.   If you would like to schedule transportation through your [facility], please call the following number at least 2 days in advance of your appointment: [phone]   Rides for urgent appointments can also be made after hours by calling Member Services.  Call the Behavioral Health Crisis Line at [phone], at any time, 24 hours a day, 7 days a week. If you are in danger or need immediate medical attention call 911.    Patient verbalizes understanding of instructions and care plan provided today and agrees to view in MyChart. Active MyChart status and patient understanding of how to access instructions and care plan via MyChart confirmed with patient.     No further follow up required: patient declines to enroll in program and declined to complete outreach today  Rosina Forte, BSN RN [facility], [facility] RN Care Manager Direct Dial: [phone]  Fax: [phone]   Following is a copy of your plan of care:  There are no care plans that you recently modified to display for this patient.

## 2024-01-30 NOTE — Patient Outreach (Signed)
 Complex Care Management   Visit Note  01/30/2024  Name:  [name] MRN: [id number] DOB: 18-May-1970  Situation: Referral received for Complex Care Management related to Heart Failure I obtained verbal consent from Patient.  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   CHF (congestive heart failure) (HCC)    Chronic abdominal pain    Chronic back pain    Chronic kidney disease, stage 1 07/04/2022   Colitis    Constipation    COPD (chronic obstructive pulmonary disease) (HCC) 04/04/2023   Depression 03/14/2023   Dyspnea    with exertion   GERD (gastroesophageal reflux disease)    Gram-negative bacteremia 09/03/2022   History of blood transfusion 08/2022   2 units transfused at [facility]   Hypertension    Insomnia    Kidney cysts    left kidney   MDD (major depressive disorder)    Migraine    Nonischemic cardiomyopathy (HCC) 06/14/2023   Oxygen  dependent    2L via [place] prn 24/7   Plumbism    blood clot    Pneumonia    x several   PONV (postoperative nausea and vomiting)    PTSD (post-traumatic stress disorder)    Pyelonephritis 09/02/2022   Renal mass    Sepsis due to Escherichia coli (E. 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   Uterine cancer [facility])    age 53    Assessment: Patient Reported Symptoms:  Cognitive Cognitive Status: No symptoms reported Cognitive/Intellectual Conditions Management [RPT]: Not Assessed      Neurological Neurological Review of Symptoms: Not assessed    HEENT HEENT Symptoms Reported: Not assessed      Cardiovascular Cardiovascular Symptoms Reported: Not assessed    Respiratory Respiratory Symptoms Reported: Not assesed    Endocrine Endocrine Symptoms Reported: Not  assessed    Gastrointestinal Gastrointestinal Symptoms Reported: Not assessed      Genitourinary Genitourinary Symptoms Reported: Not assessed    Integumentary Integumentary Symptoms Reported: Not assessed    Musculoskeletal Musculoskelatal Symptoms Reviewed: Not assessed        Psychosocial Psychosocial Symptoms Reported: Not assessed          01/30/2024    PHQ2-9 Depression Screening   Little interest or pleasure in doing things    Feeling down, depressed, or hopeless    PHQ-2 - Total Score    Trouble falling or staying asleep, or sleeping too much    Feeling tired or having little energy    Poor appetite or overeating     Feeling bad about yourself - or that you are a failure or have let yourself or your family down    Trouble concentrating on things, such as reading the newspaper or watching television    Moving or speaking so slowly that other people could have noticed.  Or the opposite - being so fidgety or restless that you have been moving around a lot more than usual    Thoughts that you would be better off dead, or hurting yourself in some way    PHQ2-9 Total Score    If you checked off any problems, how difficult have these problems made it  for you to do your work, take care of things at home, or get along with other people    Depression Interventions/Treatment      There were no vitals filed for this visit.  Medications Reviewed Today   Medications were not reviewed in this encounter     Recommendation:   Continue Current Plan of Care  Follow Up Plan:   Closing From:  Complex Care Management Patient declined to complete outreach or enroll in program. Care Management case will be closed. Patient has been provided contact information should new needs arise.   Rosina Forte, BSN RN [facility], [facility] RN Care Manager Direct Dial: [phone]  Fax: [phone]

## 2024-02-02 NOTE — Progress Notes (Unsigned)
 [name], female    DOB: 07/17/1970    MRN: [id number]  Brief patient profile:  28  yowm active smoker  referred to pulmonary clinic in [place]  02/21/2023 by [name]  for ? Copd - had CB x around 2018 then admitted April 2024 [facility]   Admit date: 09/02/2022 Discharge date: 09/09/2022   Brief Hospitalization Summary: Please see all hospital notes, images, labs for full details of the hospitalization. ADMISSION HPI:  53 year old female with a history of hypertension, chronic abdominal pain, IBS, colitis, constipation, chronic nausea presenting with worsening generalized abdominal pain Last 24 hours prior to admission.  The patient's spouse at the bedside supplements the history.  The patient is a difficult historian.  Notably, the patient's spouse states that the patient has developed chest congestion, coughing, and some dyspnea on exertion over the past week.  She continues to smoke 1 pack/day.  She has not been on any recent antibiotics.  She has had some generalized weakness.  She has had decreased oral intake.  However, in the last 24 hours prior to admission she developed numerous episodes of nausea and vomiting.  There is no hematemesis.  The patient has not had any diarrhea.  There is no hematochezia or melena.  Spouse also relates that the patient has had some symptoms of urinary urgency in the last day to 2 days.  The patient normally takes BC powders 2 times per week for pain.  There is no hematuria or hemoptysis.  There is no hematemesis. Because of her worsening condition, the patient was brought to emergency department for further evaluation and treatment.   The patient has an extensive GI history for her abdominal pain and IBS and colitis.  Notably, in April 2022 the patient was noted to have colonic wall thickening/colitis on CT.  She was  treated empirically for IBD/suspected Crohn's with prednisone  and Entocort with follow-up colonoscopy in February 2023 (off all  steroids for several months)   normal other than nonbleeding internal hemorrhoids and normal colon biopsies.   More recently, the patient was seen in the emergency department on 05/27/2022 with worsening abdominal pain, persistent vomiting throughout the day. CT A/P showed diffuse colonic wall thickening extending from distal transverse colon throughout the rectosigmoid junction consistent with inflammatory or infectious colitis. She was given a 5-day course of prednisone . She also had CT angiography 06/05/2022 that showed patent mesenteric vasculature. She was prescribed a 5-day course of Cipro .   She was most recently in the GI clinic on 08/23/2022 for her abdominal pain, IBS, and possible colitis.  It was felt that her chronic abdominal pain was multifactorial including IBS, bowel hypersensitivity syndrome, and functional abdominal pain.  She was instructed to continue dicyclomine  and Linzess . Regarding her chronic nausea and vomiting she has had extensive workup withMRI abdomen in 2022 without biliary abnormalities. Gastric emptying study normal in 2023 and EGD February 2023 consistent with Candida esophagitis and gastritis.   In the ED, the patient was afebrile hemodynamically stable with oxygen  saturation 90% on room air.  WBC 16.9, hemoglobin 8.4, platelets 222,000.  Sodium 136, potassium 3.0, bicarbonate 25, serum creatinine 0.92.  AST 47, ALT 18, alk phosphatase 187, total bilirubin 0.6.  CTA abdomen was negative for any contrast extravasation.  Did show focal area of swelling and decreased enhancement in the left kidney with mild fat perinephric stranding.  There was bowel thickening and mucosal enhancement in the ascending and proximal transverse colon.  CTA chest was negative for  any aortic dissection.  There is moderate GGO bilateral with nodular consolidation.  The patient was started on ceftriaxone  and metronidazole .    Assessment/Plan:   Severe Sepsis due to UTI  -presented with fever  (103.5) and leukocytosis -due to bacteremia, pyelonephritis and PNA -lactic peaked at 5.0 -initially on  ceftriaxone , azithro, metronidazole  -follow blood culture--ESBL E coli  -changed to meropenem /ertapenem  for a full 10 day course    E. coli bacteremia -Preliminary multiplex PCR suggest ESBL -Discontinue ceftriaxone  -continue meropenem  -Discontinue metronidazole  -ID consult requested and recommendation is for 10 day course of meropenem /ertapenem     Acute respiratory failure with hypoxia -Presented with tachypnea and hypoxia with saturation 90% on room air -Secondary to pneumonia in the setting of COPD -Stable on 2 L nasal cannula>>weaned to RA -Wean oxygen  as tolerated for saturation greater 92% -09/04/22 evening--pt had respiratory distress -09/04/22 CXR--personally reviewed--bilateral interstitial and nodular infiltrates -weaned down to 2L prior to discharge   Acute pulmonary edema - TREATED  -09/04/22 evening--respiratory distress -start IV lasix  - transitioned to oral lasix  to start 09/09/22  -BNP 2180 -Echo - EF 25-30%  -DC on oral lasix  40 mg daily with potassium supplement -pt has appt to follow up with cardiology on 09/24/22   Echo 10/02/22  wnl  with cards opinion =  Her cardiomyopathy was in the setting of her past acute illness (sepsis), NICM.    History of Present Illness  02/21/2023  Pulmonary/ 1st office eval/ [name] / [place] [facility] on spiriva dpi  Chief Complaint  Patient presents with   Consult  Dyspnea:  only goes out to go to doctor - just walking room to room at home  Cough: p supper sometimes worse / min mucoid  Sleep: bed if flat with 3 pillows and difficult to breath is lies flatter SABA use: duoneb helps at hs and saba x 3-4 has spiriva 02: 4 lpm at hs : 4lpm p exert  Lung cancer screen: referred today Rec Plan A = Automatic = Always=   Symbicort  80 (same as Breztri  x 1 puff)  Take 2 puffs first thing in am and then another 2 puffs about 12 hours  later.  Work on inhaler technique:  Plan B = Backup (to supplement plan A, not to replace it) Only use your albuterol  inhaler as a rescue medication  Plan C = Crisis (instead of Plan B but only if Plan B stops working) - only use your albuterol - ipatropium nebulizer if you first try Plan B  Also  Ok to try albuterol  15 min before an activity (on alternating days)  that you know would usually make you short of breath Protonix  40mg   Take 30- 60 min before your first and last meals of the day and Pepcid  20 mg after supper GERD diet reviewed, bed blocks rec    The key is to stop smoking completely before smoking completely stops you!        04/04/2023  f/u ov/[place] office/Chayce Robbins re: GOLD ? Copd / AB  maint on Breztri    Chief Complaint  Patient presents with   COPD  Dyspnea:  short of breath across the house without 02  Cough: after supper worse non producive  Sleeping: bed is flat 3 pillows or feel chokin /suffocating    x  years /also overt hb  SABA use: several x per day never pre treats  02: 2lpm at bedtime / same day time  but only at rest, not with activity  Lung cancer screening: not  due until 08/2023  Rec Take metaprolol 100mg  one half pill twice daily rather than a whole  pill once a day Plan A = Automatic = Always=    Symbicort  80 Take 2 puffs first thing in am and then another 2 puffs about 12 hours later.   Work on inhaler technique:  Plan B = Backup (to supplement plan A, not to replace it) Only use your albuterol  inhaler as a rescue medication Also  Ok to try albuterol  15 min before an activity (on alternating days)  that you know would usually make you short of breath No need for 02 except at bedtime    LDSCT  09/27/23 Centrilobular and paraseptal emphysema / RADS 1   02/04/2024  f/u ov/[place] office/Annalee Meyerhoff re:  doe/ emphysema on LDSCT still  smoking s obst on pfts 08/06/23  maint on symbiicort 80  / still smoking and using nicotine  patch same time.  Chief Complaint   Patient presents with   Medical Management of Chronic Issues   COPD    Increased SOB x 2 months. She has had CP off and on over the past wk.  She has had swelling in her legs x 2 wks.    Dyspnea: 10- 15 min activities of daily living then has to take a break  Cough: dry  Sleeping: flat bed / 4 pillows s    resp cc  SABA use: 2-3 x per day  02: 2 cont walking and sleeping  Lung cancer screening: RADS 1  09/27/23  New ant R cp with deep breath x sev weeks somewhat positional    No obvious day to day or daytime variability or assoc excess/ purulent sputum or mucus plugs or hemoptysis or chest tightness, subjective wheeze or overt sinus or hb symptoms.    Also denies any obvious fluctuation of symptoms with weather or environmental changes or other aggravating or alleviating factors except as outlined above   No unusual exposure hx or h/o childhood pna/ asthma or knowledge of premature birth.  Current Allergies, Complete Past Medical History, Past Surgical History, Family History, and Social History were reviewed in [employer] record.  ROS  The following are not active complaints unless bolded Hoarseness, sore throat, dysphagia, dental problems, itching, sneezing,  nasal congestion or discharge of excess mucus or purulent secretions, ear ache,   fever, chills, sweats, unintended wt loss or wt gain, classically exertional cp,  orthopnea pnd or arm/hand swelling  or leg swelling, presyncope, palpitations, abdominal pain, anorexia, nausea, vomiting, diarrhea  or change in bowel habits or change in bladder habits, change in stools or change in urine, dysuria, hematuria,  rash, arthralgias, visual complaints, headache, numbness, weakness or ataxia or problems with walking or coordination,  change in mood or  memory.        Current Meds  Medication Sig   atorvastatin  (LIPITOR) 40 MG tablet Take 1 tablet (40 mg total) by mouth daily.   Buprenorphine HCl-Naloxone HCl 8-2 MG  FILM Place 1 Film under the tongue 2 (two) times daily.   dicyclomine  (BENTYL ) 20 MG tablet Take 1 tablet (20 mg total) by mouth 3 (three) times daily before meals.   famotidine  (PEPCID ) 20 MG tablet Take 20 mg by mouth daily as needed (For break through heart burn).   FLUoxetine  (PROZAC ) 40 MG capsule Take 2 capsules (80 mg total) by mouth daily.   furosemide  (LASIX ) 40 MG tablet TAKE ONE TABLET BY MOUTH ONCE DAILY AS NEEDED FOR SWELLING.  hydrOXYzine  (ATARAX ) 50 MG tablet TAKE ONE TABLET BY MOUTH UP TO THREE TIMES DAILY AS NEEDED FOR ANXIETY   ipratropium-albuterol  (DUONEB) 0.5-2.5 (3) MG/3ML SOLN Take 3 mLs by nebulization every 4 (four) hours as needed (SOB, wheezing, coughing).   levothyroxine  (SYNTHROID ) 25 MCG tablet Take 1 tablet (25 mcg total) by mouth daily.   metoprolol  succinate (TOPROL -XL) 100 MG 24 hr tablet TAKE ONE TABLET BY MOUTH ONCE DAILY WITH OR IMMEDIATELY FOLLOWING A MEAL   mirtazapine  (REMERON ) 30 MG tablet TAKE ONE TABLET BY MOUTH AT BEDTIME   Nebulizer System All-In-One MISC 1 Device by Does not apply route as directed.   nicotine  (NICOTINE  STEP 2) 14 mg/24hr patch Place 1 patch (14 mg total) onto the skin daily.   nitroGLYCERIN  (NITROSTAT ) 0.4 MG SL tablet Place 1 tablet (0.4 mg total) under the tongue every 5 (five) minutes as needed for chest pain.   OLANZapine (ZYPREXA) 5 MG tablet Take 5 mg by mouth daily.   ondansetron  (ZOFRAN -ODT) 8 MG disintegrating tablet TAKE ONE TABLET BY MOUTH EVERY 8 HOURS AS NEEDED FOR NAUSEA AND VOMITING   oxyCODONE  (ROXICODONE ) 5 MG immediate release tablet Take 1 tablet (5 mg total) by mouth every 6 (six) hours as needed for severe pain (pain score 7-10).   OXYGEN  Inhale 2 L into the lungs at bedtime.   pantoprazole  (PROTONIX ) 40 MG tablet TAKE ONE TABLET BY MOUTH TWICE DAILY WITH FOOD (BREAKFAST AND SUPPER)   potassium chloride  SA (KLOR-CON  M) 20 MEQ tablet Take 1 tablet (20 mEq total) by mouth daily as needed. (Patient taking  differently: Take 20 mEq by mouth daily as needed (Use with Furosemide ).)   promethazine  (PHENERGAN ) 25 MG tablet TAKE ONE TABLET BY MOUTH EVERY 8 HOURS AS NEEDED FOR NAUSEA AND VOMITING   sacubitril -valsartan  (ENTRESTO ) 24-26 MG TAKE ONE TABLET BY MOUTH TWICE DAILY   spironolactone  (ALDACTONE ) 25 MG tablet TAKE ONE-HALF TABLET BY MOUTH ONCE DAILY   temazepam  (RESTORIL ) 30 MG capsule Take 30 mg by mouth at bedtime as needed.   VENTOLIN  HFA 108 (90 Base) MCG/ACT inhaler INHALE TWO PUFFS INTO THE LUNGS EVERY 6 HOURS AS NEEDED FOR SHORTNESS OF BREATH OR FOR WHEEZING   [DISCONTINUED] NICOTINE  STEP 2 14 MG/24HR patch PLACE ONE PATCH ON THE SKIN DAILY   [DISCONTINUED] SYMBICORT  160-4.5 MCG/ACT inhaler Inhale 2 puffs into the lungs 2 (two) times daily.                Past Medical History:  Diagnosis Date   Chronic abdominal pain    Chronic back pain    Colitis    Constipation    GERD (gastroesophageal reflux disease)    Gram-negative bacteremia 09/03/2022   Hypertension    Migraine    Plumbism    blood clot    PONV (postoperative nausea and vomiting)    Pyelonephritis 09/02/2022   Renal mass    Sepsis due to Escherichia coli (E. coli) (HCC) 09/03/2022   Sepsis due to undetermined organism (HCC) 09/02/2022   Uterine cancer [facility])    age 8      Objective:    wts  02/04/2024       153  08/06/2023       152   04/04/2023     137   02/21/23 136 lb 8 oz (61.9 kg)  02/11/23 133 lb 6.4 oz (60.5 kg)  11/30/22 127 lb (57.6 kg)    Vital signs reviewed  02/04/2024  - Note at rest 02 sats  97% on RA   General appearance:    somber amb (though unsteady) wf nad      HEENT : Oropharynx  clear   Nasal turbinates nl    NECK :  without  apparent JVD/ palpable Nodes/TM    LUNGS: no acc muscle use,  Min barrel  contour chest wall with bilateral  slightly decreased bs s audible wheeze and  without cough on insp or exp maneuvers and min  Hyperresonant  to  percussion bilaterally    CV:   RRR  no s3 or murmur or increase in P2, and no edema   ABD:  soft and nontender    MS:  Nl gait/ ext warm without deformities Or obvious joint restrictions  calf tenderness, cyanosis or clubbing     SKIN: warm and dry without lesions    NEURO:  alert, approp, nl sensorium with  no motor or cerebellar deficits apparent.             Assessment   Assessment & Plan DOE (dyspnea on exertion) Reported doe room to room 04/04/2023 with clear lungs on exam - 04/04/2023   Walked on RA  x  3  lap(s) =  approx 450  ft  @ slow  pace,   with lowest 02 sats 98%  patient c/o fatigue and shob during 2nd lap; patient was very unsteady and wobbly during walk   - 04/04/2023 referred to pulmonary rehab  - 02/04/2024   Walked on RA  x  1  lap(s) =  approx 150  ft  @ slow/ unsteady  pace, stopped due to tired > SOB   with lowest 02 sats 98%    No indication for 02 at this point but should continue to monitor at peak ex and when sob for any reason.   COPD mixed type [facility]) Active smoker  - 02/21/2023  After extensive coaching inhaler device,  effectiveness =    75% from a baseline of 50% > try breztri  sample then change to symbicort  80 1 bid to address cough and stop spiriva > did not change to symbicort  as of 04/04/2023 and cough early insp present > try again symb 80 2 bid  - PFT's  08/06/23  FEV1 1.44 (59 % ) ratio 0.79  p 3 % improvement from saba p symbicort  80 prior to study with DLCO  12 (64%)   and FV curve flattened on insp   ERV 19% at wt 150  - 02/04/2024  After extensive coaching inhaler device,  effectiveness = 75% (short ti  for hfa)  - Allergy screen 02/04/2024 >  Eos 0.  Alpha One AT phenotype pending   Hx most c/w AB > rx symbicort  80 2bid and prn saba   Re SABA :  I spent extra time with pt today reviewing appropriate use of albuterol  for prn use on exertion with the following points: 1) saba is for relief of sob that does not improve by walking a slower pace or resting but rather if the pt  does not improve after trying this first. 2) If the pt is convinced, as many are, that saba helps recover from activity faster then it's easy to tell if this is the case by re-challenging : ie stop, take the inhaler, then p 5 minutes try the exact same activity (intensity of workload) that just caused the symptoms and see if they are substantially diminished or not after saba 3) if there is an activity that reproducibly causes  the symptoms, try the saba 15 min before the activity on alternate days   If in fact the saba really does help, then fine to continue to use it prn but advised may need to look closer at the maintenance regimen being used to achieve better control of airways disease with exertion.   Cigarette smoker 3  min discussion re active cigarette smoking in addition to office E&M  Ask about tobacco use:   ongoing Advise quitting   I took an extended  opportunity with this patient to outline the consequences of continued cigarette use  in airway disorders based on all the data we have from the multiple national lung health studies (perfomed over decades at millions of dollars in cost)  indicating that smoking cessation, not choice of inhalers or pulmonary physicians, is the most important aspect of her care.   Assess willingness:  Not committed at this point Assist in quit attempt:  refilled nicotine  x one month but strongly warned against smoking and wearing the patch as she's just getting herself more addicted to nicotine .  Arrange follow up:   Follow up per Primary Care planned      Chronic pain syndrome Has non-specific ant R chest pain that is both positional and somewhat pleuritic but did fine on walk test and legs are thin and no evidence  dvt so PE unlikely but will check D dimer to be sure.  Each maintenance medication was reviewed in detail including emphasizing most importantly the difference between maintenance and prns and under what circumstances the prns are to be  triggered using an action plan format where appropriate.  Total time for H and P, chart review, counseling, reviewing hfa/pulse 0x device(s) , directly observing portions of ambulatory 02 saturation study/ and generating customized AVS unique to this office visit / same day charting = 30 min                 AVS  Patient Instructions  The key is to stop smoking completely before smoking completely stops you! - you should not smoke at all while wearing the patch.  Make sure you check your oxygen  saturation  AT  your highest level of activity (not after you stop)   to be sure it stays over 90% and adjust  02 flow upward to maintain this level if needed but remember to turn it back to previous settings when you stop (to conserve your supply).     Ok to try albuterol  15 min before an activity (on alternating days)  that you know would usually make you short of breath and see if it makes any difference and if makes none then don't take albuterol  after activity unless you can't catch your breath as this means it's the resting that helps, not the albuterol .  Please remember to go to the lab department   for your tests - we will call you with the results when they are available.       Please schedule a follow up visit in 6  months but call sooner if needed               Ozell America, MD 02/05/2024

## 2024-02-03 ENCOUNTER — Other Ambulatory Visit: Payer: Self-pay | Admitting: Gastroenterology

## 2024-02-03 DIAGNOSIS — R112 Nausea with vomiting, unspecified: Secondary | ICD-10-CM

## 2024-02-04 ENCOUNTER — Ambulatory Visit: Admitting: Internal Medicine

## 2024-02-04 ENCOUNTER — Encounter: Payer: Self-pay | Admitting: Internal Medicine

## 2024-02-04 VITALS — BP 114/62 | HR 69 | Ht 61.0 in | Wt 153.6 lb

## 2024-02-04 DIAGNOSIS — G894 Chronic pain syndrome: Secondary | ICD-10-CM

## 2024-02-04 DIAGNOSIS — R0609 Other forms of dyspnea: Secondary | ICD-10-CM | POA: Diagnosis not present

## 2024-02-04 DIAGNOSIS — J449 Chronic obstructive pulmonary disease, unspecified: Secondary | ICD-10-CM | POA: Diagnosis not present

## 2024-02-04 DIAGNOSIS — F17210 Nicotine dependence, cigarettes, uncomplicated: Secondary | ICD-10-CM

## 2024-02-04 MED ORDER — SYMBICORT 160-4.5 MCG/ACT IN AERO: INHALATION_SPRAY | RESPIRATORY_TRACT | 11 refills | Status: DC

## 2024-02-04 MED ORDER — NICOTINE 14 MG/24HR TD PT24: 14.0000 mg | MEDICATED_PATCH | Freq: Every day | TRANSDERMAL | 0 refills | Status: DC

## 2024-02-04 NOTE — Patient Instructions (Addendum)
 The key is to stop smoking completely before smoking completely stops you! - you should not smoke at all while wearing the patch.  Make sure you check your oxygen  saturation  AT  your highest level of activity (not after you stop)   to be sure it stays over 90% and adjust  02 flow upward to maintain this level if needed but remember to turn it back to previous settings when you stop (to conserve your supply).     Ok to try albuterol  15 min before an activity (on alternating days)  that you know would usually make you short of breath and see if it makes any difference and if makes none then don't take albuterol  after activity unless you can't catch your breath as this means it's the resting that helps, not the albuterol .  Please remember to go to the lab department   for your tests - we will call you with the results when they are available.       Please schedule a follow up visit in 6  months but call sooner if needed

## 2024-02-05 ENCOUNTER — Telehealth: Payer: Self-pay

## 2024-02-05 ENCOUNTER — Other Ambulatory Visit: Payer: Self-pay | Admitting: Gastroenterology

## 2024-02-05 DIAGNOSIS — R1084 Generalized abdominal pain: Secondary | ICD-10-CM

## 2024-02-05 DIAGNOSIS — G8929 Other chronic pain: Secondary | ICD-10-CM

## 2024-02-05 LAB — CBC WITH DIFFERENTIAL/PLATELET
Basophils Absolute: 0.1 x10E3/uL (ref 0.0–0.2)
Basos: 1 %
EOS (ABSOLUTE): 0.4 x10E3/uL (ref 0.0–0.4)
Eos: 4 %
Hematocrit: 32.5 % — ABNORMAL LOW (ref 34.0–46.6)
Hemoglobin: 9.9 g/dL — ABNORMAL LOW (ref 11.1–15.9)
Immature Grans (Abs): 0.1 x10E3/uL (ref 0.0–0.1)
Immature Granulocytes: 1 %
Lymphocytes Absolute: 1.9 x10E3/uL (ref 0.7–3.1)
Lymphs: 21 %
MCH: 27.3 pg (ref 26.6–33.0)
MCHC: 30.5 g/dL — ABNORMAL LOW (ref 31.5–35.7)
MCV: 90 fL (ref 79–97)
Monocytes Absolute: 0.9 x10E3/uL (ref 0.1–0.9)
Monocytes: 10 %
Neutrophils Absolute: 5.6 x10E3/uL (ref 1.4–7.0)
Neutrophils: 63 %
Platelets: 394 x10E3/uL (ref 150–450)
RBC: 3.63 x10E6/uL — ABNORMAL LOW (ref 3.77–5.28)
RDW: 17.1 % — ABNORMAL HIGH (ref 11.7–15.4)
WBC: 8.9 x10E3/uL (ref 3.4–10.8)

## 2024-02-05 LAB — D-DIMER, QUANTITATIVE: D-DIMER: 1.18 mg{FEU}/L — ABNORMAL HIGH (ref 0.00–0.49)

## 2024-02-05 NOTE — Assessment & Plan Note (Addendum)
 Active smoker  - 02/21/2023  After extensive coaching inhaler device,  effectiveness =    75% from a baseline of 50% > try breztri  sample then change to symbicort  80 1 bid to address cough and stop spiriva > did not change to symbicort  as of 04/04/2023 and cough early insp present > try again symb 80 2 bid  - PFT's  08/06/23  FEV1 1.44 (59 % ) ratio 0.79  p 3 % improvement from saba p symbicort  80 prior to study with DLCO  12 (64%)   and FV curve flattened on insp   ERV 19% at wt 150  - 02/04/2024  After extensive coaching inhaler device,  effectiveness = 75% (short ti  for hfa)  - Allergy screen 02/04/2024 >  Eos 0.  Alpha One AT phenotype pending   Hx most c/w AB > rx symbicort  80 2bid and prn saba   Re SABA :  I spent extra time with pt today reviewing appropriate use of albuterol  for prn use on exertion with the following points: 1) saba is for relief of sob that does not improve by walking a slower pace or resting but rather if the pt does not improve after trying this first. 2) If the pt is convinced, as many are, that saba helps recover from activity faster then it's easy to tell if this is the case by re-challenging : ie stop, take the inhaler, then p 5 minutes try the exact same activity (intensity of workload) that just caused the symptoms and see if they are substantially diminished or not after saba 3) if there is an activity that reproducibly causes the symptoms, try the saba 15 min before the activity on alternate days   If in fact the saba really does help, then fine to continue to use it prn but advised may need to look closer at the maintenance regimen being used to achieve better control of airways disease with exertion.

## 2024-02-05 NOTE — Assessment & Plan Note (Addendum)
 3  min discussion re active cigarette smoking in addition to office E&M  Ask about tobacco use:   ongoing Advise quitting   I took an extended  opportunity with this patient to outline the consequences of continued cigarette use  in airway disorders based on all the data we have from the multiple national lung health studies (perfomed over decades at millions of dollars in cost)  indicating that smoking cessation, not choice of inhalers or pulmonary physicians, is the most important aspect of her care.   Assess willingness:  Not committed at this point Assist in quit attempt:  refilled nicotine  x one month but strongly warned against smoking and wearing the patch as she's just getting herself more addicted to nicotine .  Arrange follow up:   Follow up per Primary Care planned

## 2024-02-05 NOTE — Assessment & Plan Note (Addendum)
 Reported doe room to room 04/04/2023 with clear lungs on exam - 04/04/2023   Walked on RA  x  3  lap(s) =  approx 450  ft  @ slow  pace,   with lowest 02 sats 98%  patient c/o fatigue and shob during 2nd lap; patient was very unsteady and wobbly during walk   - 04/04/2023 referred to pulmonary rehab  - 02/04/2024   Walked on RA  x  1  lap(s) =  approx 150  ft  @ slow/ unsteady  pace, stopped due to tired > SOB   with lowest 02 sats 98%    No indication for 02 at this point but should continue to monitor at peak ex and when sob for any reason.

## 2024-02-05 NOTE — Telephone Encounter (Signed)
 Patient called the office in regards to seeing her lab results through [facility] patient portal.  Pt states everything says abnormal and is very anxious. Routing to Dr. Darlean, can you please advise?

## 2024-02-05 NOTE — Assessment & Plan Note (Addendum)
 Has non-specific ant R chest pain that is both positional and somewhat pleuritic but did fine on walk test and legs are thin and no evidence  dvt so PE unlikely but will check D dimer to be sure.  Each maintenance medication was reviewed in detail including emphasizing most importantly the difference between maintenance and prns and under what circumstances the prns are to be triggered using an action plan format where appropriate.  Total time for H and P, chart review, counseling, reviewing hfa/pulse 0x device(s) , directly observing portions of ambulatory 02 saturation study/ and generating customized AVS unique to this office visit / same day charting = 30 min

## 2024-02-06 ENCOUNTER — Ambulatory Visit (HOSPITAL_COMMUNITY)
Admission: RE | Admit: 2024-02-06 | Discharge: 2024-02-06 | Disposition: A | Discharge status: Home / Self Care | Source: Ambulatory Visit | Attending: Nurse Practitioner | Admitting: Nurse Practitioner

## 2024-02-06 DIAGNOSIS — E039 Hypothyroidism, unspecified: Secondary | ICD-10-CM | POA: Diagnosis not present

## 2024-02-06 NOTE — Telephone Encounter (Signed)
 Attempted to call her today - her labs are not completely nl and will need to be repeated next visit unless condition deteriorates in the meantime   I have not called yet because they are not all back but there is no immediate need for any change in recs from last ov and I will contact her after all tests are back.

## 2024-02-06 NOTE — Telephone Encounter (Signed)
 Pt calling for lab results.  She sates she hopes Dr Darlean can take time to look at them today/tomorrow. She says she sees a few abnormal results and she is worried.

## 2024-02-07 ENCOUNTER — Encounter: Payer: Self-pay | Admitting: Internal Medicine

## 2024-02-07 NOTE — Telephone Encounter (Signed)
Called the pt and there was no answer and no option to leave msg

## 2024-02-07 NOTE — Telephone Encounter (Unsigned)
 Copied from CRM [id number]. Topic: Clinical - Lab/Test Results >> Feb 06, 2024 12:43 PM Rilla B wrote: Reason for CRM: Patient calling again (nervous) and wants to talk to someone about her lab results.  She sees abnormal in Mychart.  Please call patient [phone].

## 2024-02-07 NOTE — Telephone Encounter (Signed)
 Copied from CRM #[id number]. Topic: Clinical - Lab/Test Results >> Feb 05, 2024  3:33 PM Rilla B wrote: Reason for CRM: Patient calling in to talk to someone regarding her lab results which are in My chart.  Patient does have some abnormal results. Call CAL, no one available.  Please call patient @ [phone].   ----------------------------------------------------------------------- From previous Reason for Contact - Other: Reason for CRM: Patient calling in to talk to someone regarding her lab results which are in My chart.  Patient does have some abnormal results. Call CAL  Sending patient Mychart message letting her know you will contact her once all results are back.

## 2024-02-10 LAB — ALPHA-1-ANTITRYPSIN PHENOTYP: A-1 Antitrypsin: 220 mg/dL — ABNORMAL HIGH (ref 101–187)

## 2024-02-10 NOTE — Progress Notes (Signed)
 FYI: I sent mychart message going over thyroid ultrasound results.

## 2024-02-10 NOTE — Progress Notes (Signed)
 Noted a noted has been sent to the patient in MyChart going over results.

## 2024-02-12 ENCOUNTER — Other Ambulatory Visit: Payer: Self-pay

## 2024-02-12 ENCOUNTER — Ambulatory Visit (INDEPENDENT_AMBULATORY_CARE_PROVIDER_SITE_OTHER): Admitting: Nurse Practitioner

## 2024-02-12 ENCOUNTER — Emergency Department (HOSPITAL_COMMUNITY)

## 2024-02-12 ENCOUNTER — Inpatient Hospital Stay (HOSPITAL_COMMUNITY)
Admission: EM | Admit: 2024-02-12 | Discharge: 2024-02-13 | DRG: 640 | Discharge status: Against Medical Advice | Attending: Internal Medicine | Admitting: Internal Medicine

## 2024-02-12 ENCOUNTER — Encounter: Payer: Self-pay | Admitting: Nurse Practitioner

## 2024-02-12 ENCOUNTER — Encounter (HOSPITAL_COMMUNITY): Payer: Self-pay

## 2024-02-12 VITALS — BP 130/80 | HR 72 | Ht 61.0 in | Wt 156.8 lb

## 2024-02-12 DIAGNOSIS — N189 Chronic kidney disease, unspecified: Secondary | ICD-10-CM | POA: Diagnosis present

## 2024-02-12 DIAGNOSIS — Z8542 Personal history of malignant neoplasm of other parts of uterus: Secondary | ICD-10-CM

## 2024-02-12 DIAGNOSIS — T502X5A Adverse effect of carbonic-anhydrase inhibitors, benzothiadiazides and other diuretics, initial encounter: Secondary | ICD-10-CM | POA: Diagnosis present

## 2024-02-12 DIAGNOSIS — Z881 Allergy status to other antibiotic agents status: Secondary | ICD-10-CM | POA: Diagnosis not present

## 2024-02-12 DIAGNOSIS — G4700 Insomnia, unspecified: Secondary | ICD-10-CM | POA: Diagnosis present

## 2024-02-12 DIAGNOSIS — Z880 Allergy status to penicillin: Secondary | ICD-10-CM

## 2024-02-12 DIAGNOSIS — K219 Gastro-esophageal reflux disease without esophagitis: Secondary | ICD-10-CM | POA: Diagnosis not present

## 2024-02-12 DIAGNOSIS — D649 Anemia, unspecified: Secondary | ICD-10-CM | POA: Diagnosis present

## 2024-02-12 DIAGNOSIS — F4310 Post-traumatic stress disorder, unspecified: Secondary | ICD-10-CM | POA: Diagnosis present

## 2024-02-12 DIAGNOSIS — I5020 Unspecified systolic (congestive) heart failure: Secondary | ICD-10-CM | POA: Insufficient documentation

## 2024-02-12 DIAGNOSIS — Z888 Allergy status to other drugs, medicaments and biological substances status: Secondary | ICD-10-CM

## 2024-02-12 DIAGNOSIS — G8929 Other chronic pain: Secondary | ICD-10-CM | POA: Diagnosis present

## 2024-02-12 DIAGNOSIS — I10 Essential (primary) hypertension: Secondary | ICD-10-CM | POA: Diagnosis not present

## 2024-02-12 DIAGNOSIS — E039 Hypothyroidism, unspecified: Secondary | ICD-10-CM | POA: Diagnosis present

## 2024-02-12 DIAGNOSIS — F329 Major depressive disorder, single episode, unspecified: Secondary | ICD-10-CM | POA: Diagnosis present

## 2024-02-12 DIAGNOSIS — Z86711 Personal history of pulmonary embolism: Secondary | ICD-10-CM | POA: Diagnosis not present

## 2024-02-12 DIAGNOSIS — R0989 Other specified symptoms and signs involving the circulatory and respiratory systems: Secondary | ICD-10-CM | POA: Diagnosis not present

## 2024-02-12 DIAGNOSIS — I428 Other cardiomyopathies: Secondary | ICD-10-CM | POA: Diagnosis present

## 2024-02-12 DIAGNOSIS — R7989 Other specified abnormal findings of blood chemistry: Secondary | ICD-10-CM | POA: Insufficient documentation

## 2024-02-12 DIAGNOSIS — R0602 Shortness of breath: Secondary | ICD-10-CM | POA: Diagnosis not present

## 2024-02-12 DIAGNOSIS — I700 Atherosclerosis of aorta: Secondary | ICD-10-CM | POA: Diagnosis not present

## 2024-02-12 DIAGNOSIS — Z79890 Hormone replacement therapy (postmenopausal): Secondary | ICD-10-CM

## 2024-02-12 DIAGNOSIS — Z833 Family history of diabetes mellitus: Secondary | ICD-10-CM

## 2024-02-12 DIAGNOSIS — F17210 Nicotine dependence, cigarettes, uncomplicated: Secondary | ICD-10-CM | POA: Diagnosis present

## 2024-02-12 DIAGNOSIS — R55 Syncope and collapse: Secondary | ICD-10-CM | POA: Diagnosis not present

## 2024-02-12 DIAGNOSIS — Z886 Allergy status to analgesic agent status: Secondary | ICD-10-CM

## 2024-02-12 DIAGNOSIS — J449 Chronic obstructive pulmonary disease, unspecified: Secondary | ICD-10-CM | POA: Diagnosis present

## 2024-02-12 DIAGNOSIS — Z823 Family history of stroke: Secondary | ICD-10-CM

## 2024-02-12 DIAGNOSIS — Z7951 Long term (current) use of inhaled steroids: Secondary | ICD-10-CM

## 2024-02-12 DIAGNOSIS — Z90710 Acquired absence of both cervix and uterus: Secondary | ICD-10-CM

## 2024-02-12 DIAGNOSIS — R945 Abnormal results of liver function studies: Secondary | ICD-10-CM | POA: Diagnosis not present

## 2024-02-12 DIAGNOSIS — I130 Hypertensive heart and chronic kidney disease with heart failure and stage 1 through stage 4 chronic kidney disease, or unspecified chronic kidney disease: Secondary | ICD-10-CM | POA: Diagnosis present

## 2024-02-12 DIAGNOSIS — R590 Localized enlarged lymph nodes: Secondary | ICD-10-CM | POA: Diagnosis not present

## 2024-02-12 DIAGNOSIS — J4489 Other specified chronic obstructive pulmonary disease: Secondary | ICD-10-CM | POA: Diagnosis present

## 2024-02-12 DIAGNOSIS — J440 Chronic obstructive pulmonary disease with acute lower respiratory infection: Secondary | ICD-10-CM | POA: Diagnosis present

## 2024-02-12 DIAGNOSIS — Z885 Allergy status to narcotic agent status: Secondary | ICD-10-CM | POA: Diagnosis not present

## 2024-02-12 DIAGNOSIS — G43909 Migraine, unspecified, not intractable, without status migrainosus: Secondary | ICD-10-CM | POA: Diagnosis present

## 2024-02-12 DIAGNOSIS — Z1152 Encounter for screening for COVID-19: Secondary | ICD-10-CM | POA: Diagnosis not present

## 2024-02-12 DIAGNOSIS — R079 Chest pain, unspecified: Secondary | ICD-10-CM | POA: Diagnosis not present

## 2024-02-12 DIAGNOSIS — E871 Hypo-osmolality and hyponatremia: Secondary | ICD-10-CM | POA: Diagnosis not present

## 2024-02-12 DIAGNOSIS — J9611 Chronic respiratory failure with hypoxia: Secondary | ICD-10-CM | POA: Diagnosis not present

## 2024-02-12 DIAGNOSIS — I5022 Chronic systolic (congestive) heart failure: Secondary | ICD-10-CM | POA: Diagnosis present

## 2024-02-12 DIAGNOSIS — Z9981 Dependence on supplemental oxygen: Secondary | ICD-10-CM | POA: Diagnosis not present

## 2024-02-12 DIAGNOSIS — J189 Pneumonia, unspecified organism: Secondary | ICD-10-CM | POA: Diagnosis not present

## 2024-02-12 DIAGNOSIS — Z800 Family history of malignant neoplasm of digestive organs: Secondary | ICD-10-CM

## 2024-02-12 DIAGNOSIS — J984 Other disorders of lung: Secondary | ICD-10-CM | POA: Diagnosis not present

## 2024-02-12 DIAGNOSIS — Z79899 Other long term (current) drug therapy: Secondary | ICD-10-CM

## 2024-02-12 DIAGNOSIS — E785 Hyperlipidemia, unspecified: Secondary | ICD-10-CM | POA: Diagnosis present

## 2024-02-12 DIAGNOSIS — F419 Anxiety disorder, unspecified: Secondary | ICD-10-CM | POA: Diagnosis present

## 2024-02-12 DIAGNOSIS — I959 Hypotension, unspecified: Secondary | ICD-10-CM | POA: Diagnosis present

## 2024-02-12 DIAGNOSIS — R918 Other nonspecific abnormal finding of lung field: Secondary | ICD-10-CM | POA: Diagnosis not present

## 2024-02-12 LAB — COMPREHENSIVE METABOLIC PANEL WITH GFR
ALT: 70 U/L — ABNORMAL HIGH (ref 0–44)
AST: 63 U/L — ABNORMAL HIGH (ref 15–41)
Albumin: 3.8 g/dL (ref 3.5–5.0)
Alkaline Phosphatase: 232 U/L — ABNORMAL HIGH (ref 38–126)
Anion gap: 12 (ref 5–15)
BUN: 19 mg/dL (ref 6–20)
CO2: 22 mmol/L (ref 22–32)
Calcium: 8.6 mg/dL — ABNORMAL LOW (ref 8.9–10.3)
Chloride: 80 mmol/L — ABNORMAL LOW (ref 98–111)
Creatinine, Ser: 1.18 mg/dL — ABNORMAL HIGH (ref 0.44–1.00)
GFR, Estimated: 55 mL/min — ABNORMAL LOW (ref >60)
Glucose, Bld: 89 mg/dL (ref 70–99)
Potassium: 4.3 mmol/L (ref 3.5–5.1)
Sodium: 114 mmol/L — CL (ref 135–145)
Total Bilirubin: <0.2 mg/dL (ref 0.0–1.2)
Total Protein: 7.7 g/dL (ref 6.5–8.1)

## 2024-02-12 LAB — CBC WITH DIFFERENTIAL/PLATELET
Abs Immature Granulocytes: 0.12 K/uL — ABNORMAL HIGH (ref 0.00–0.07)
Basophils Absolute: 0.1 K/uL (ref 0.0–0.1)
Basophils Relative: 0 %
Eosinophils Absolute: 0.3 K/uL (ref 0.0–0.5)
Eosinophils Relative: 2 %
HCT: 31.4 % — ABNORMAL LOW (ref 36.0–46.0)
Hemoglobin: 10.4 g/dL — ABNORMAL LOW (ref 12.0–15.0)
Immature Granulocytes: 1 %
Lymphocytes Relative: 14 %
Lymphs Abs: 1.6 K/uL (ref 0.7–4.0)
MCH: 27.7 pg (ref 26.0–34.0)
MCHC: 33.1 g/dL (ref 30.0–36.0)
MCV: 83.7 fL (ref 80.0–100.0)
Monocytes Absolute: 0.7 K/uL (ref 0.1–1.0)
Monocytes Relative: 6 %
Neutro Abs: 8.5 K/uL — ABNORMAL HIGH (ref 1.7–7.7)
Neutrophils Relative %: 77 %
Platelets: 383 K/uL (ref 150–400)
RBC: 3.75 MIL/uL — ABNORMAL LOW (ref 3.87–5.11)
RDW: 16.6 % — ABNORMAL HIGH (ref 11.5–15.5)
WBC: 11.3 K/uL — ABNORMAL HIGH (ref 4.0–10.5)
nRBC: 0 % (ref 0.0–0.2)

## 2024-02-12 LAB — BASIC METABOLIC PANEL WITH GFR
Anion gap: 13 (ref 5–15)
BUN: 18 mg/dL (ref 6–20)
CO2: 24 mmol/L (ref 22–32)
Calcium: 8.8 mg/dL — ABNORMAL LOW (ref 8.9–10.3)
Chloride: 81 mmol/L — ABNORMAL LOW (ref 98–111)
Creatinine, Ser: 1.11 mg/dL — ABNORMAL HIGH (ref 0.44–1.00)
GFR, Estimated: 59 mL/min — ABNORMAL LOW (ref >60)
Glucose, Bld: 88 mg/dL (ref 70–99)
Potassium: 4 mmol/L (ref 3.5–5.1)
Sodium: 119 mmol/L — CL (ref 135–145)

## 2024-02-12 LAB — LACTIC ACID, PLASMA: Lactic Acid, Venous: 0.9 mmol/L (ref 0.5–1.9)

## 2024-02-12 LAB — TROPONIN T, HIGH SENSITIVITY: Troponin T High Sensitivity: <15 ng/L (ref 0–19)

## 2024-02-12 LAB — ETHANOL: Alcohol, Ethyl (B): <15 mg/dL (ref <15)

## 2024-02-12 MED ORDER — SODIUM CHLORIDE 0.9 % IV BOLUS
1000.0000 mL | Freq: Once | INTRAVENOUS | Status: AC
Start: 2024-02-12 — End: 2024-02-13
  Administered 2024-02-13: 1000 mL via INTRAVENOUS

## 2024-02-12 MED ORDER — SODIUM CHLORIDE 0.9 % IV SOLN
1.0000 g | Freq: Once | INTRAVENOUS | Status: AC
  Administered 2024-02-12: 1 g via INTRAVENOUS
  Filled 2024-02-12: qty 10

## 2024-02-12 MED ORDER — SODIUM CHLORIDE 0.9 % IV BOLUS
1000.0000 mL | Freq: Once | INTRAVENOUS | Status: AC
Start: 2024-02-12 — End: 2024-02-13
  Administered 2024-02-12: 1000 mL via INTRAVENOUS

## 2024-02-12 MED ORDER — SODIUM CHLORIDE 0.9 % IV BOLUS
1000.0000 mL | Freq: Once | INTRAVENOUS | Status: AC
  Administered 2024-02-12: 1000 mL via INTRAVENOUS

## 2024-02-12 MED ORDER — MORPHINE SULFATE (PF) 4 MG/ML IV SOLN
4.0000 mg | Freq: Once | INTRAVENOUS | Status: AC
  Administered 2024-02-12: 4 mg via INTRAVENOUS
  Filled 2024-02-12: qty 1

## 2024-02-12 MED ORDER — IOHEXOL 350 MG/ML SOLN
75.0000 mL | Freq: Once | INTRAVENOUS | Status: AC | PRN
  Administered 2024-02-12: 75 mL via INTRAVENOUS

## 2024-02-12 MED ORDER — DOXYCYCLINE HYCLATE 100 MG PO TABS
100.0000 mg | ORAL_TABLET | Freq: Once | ORAL | Status: DC
  Filled 2024-02-12: qty 1

## 2024-02-12 MED ORDER — ONDANSETRON HCL 4 MG/2ML IJ SOLN
4.0000 mg | Freq: Once | INTRAMUSCULAR | Status: AC
  Administered 2024-02-12: 4 mg via INTRAVENOUS
  Filled 2024-02-12: qty 2

## 2024-02-12 NOTE — ED Provider Notes (Signed)
 [place] EMERGENCY DEPARTMENT AT [facility] Provider Note   CSN: [id number] Arrival date & time: 02/12/24  1541     Patient presents with: Shortness of Breath   [name] is a 53 y.o. female.   Pt is a 53 yo female with pmhx significant for htn, migraines, chronic pain, uterine cancer, GERD, CHF, arthritis, chf, hx PE (not on thinners).  Pt has been feeling tired and has been having some cp.  She has had 2 episodes of syncope where she wakes up on the ground and has hit her head.  She did see pulmonology on 9/23 and had blood work done.  She did have a + ddimer.  She called the office as she saw the abn result in My Chart, but there was no one there to speak to her, so she did not know what it meant.  She had a cardiology apt today who noted the + ddimer and told her to come to the ED for eval of PE.           Prior to Admission medications   Medication Sig Start Date End Date Taking? Authorizing Provider  atorvastatin  (LIPITOR) 40 MG tablet Take 1 tablet (40 mg total) by mouth daily. 11/19/22 02/11/97 Yes Miriam Norris, NP  Buprenorphine HCl-Naloxone HCl 8-2 MG FILM Place 1 Film under the tongue 2 (two) times daily. 04/02/23  Yes [provider]  dicyclomine  (BENTYL ) 20 MG tablet TAKE ONE TABLET BY MOUTH THREE TIMES DAILY BEFORE MEALS Patient taking differently: Take 20 mg by mouth 3 (three) times daily before meals. 02/05/24  Yes Mahon, Charmaine CROME, NP  famotidine  (PEPCID ) 20 MG tablet Take 20 mg by mouth daily as needed (For break through heart burn).   Yes [provider]  FLUoxetine  (PROZAC ) 40 MG capsule Take 2 capsules (80 mg total) by mouth daily. 06/14/23 02/11/97 Yes Melvenia Manus BRAVO, MD  furosemide  (LASIX ) 40 MG tablet TAKE ONE TABLET BY MOUTH ONCE DAILY AS NEEDED FOR SWELLING. Patient taking differently: Take 20 mg by mouth daily as needed for fluid or edema. 10/09/23  Yes Mallipeddi, Vishnu P, MD  hydrOXYzine  (ATARAX ) 50 MG tablet TAKE ONE  TABLET BY MOUTH UP TO THREE TIMES DAILY AS NEEDED FOR ANXIETY Patient taking differently: Take 50 mg by mouth 3 (three) times daily as needed for anxiety. 06/20/23  Yes Melvenia Manus BRAVO, MD  ipratropium-albuterol  (DUONEB) 0.5-2.5 (3) MG/3ML SOLN Take 3 mLs by nebulization every 4 (four) hours as needed (SOB, wheezing, coughing). 09/09/22  Yes Johnson, Clanford L, MD  levothyroxine  (SYNTHROID ) 25 MCG tablet Take 1 tablet (25 mcg total) by mouth daily. 01/29/24  Yes Reardon, Benton JINNY, NP  metoprolol  succinate (TOPROL -XL) 100 MG 24 hr tablet TAKE ONE TABLET BY MOUTH ONCE DAILY WITH OR IMMEDIATELY FOLLOWING A MEAL Patient taking differently: Take 100 mg by mouth daily. 12/19/23  Yes Mallipeddi, Vishnu P, MD  nicotine  (NICOTINE  STEP 2) 14 mg/24hr patch Place 1 patch (14 mg total) onto the skin daily. 02/04/24  Yes Darlean Ozell NOVAK, MD  nitroGLYCERIN  (NITROSTAT ) 0.4 MG SL tablet Place 1 tablet (0.4 mg total) under the tongue every 5 (five) minutes as needed for chest pain. 01/24/24  Yes Mallipeddi, Vishnu P, MD  OLANZapine (ZYPREXA) 5 MG tablet Take 5 mg by mouth daily. 01/28/24  Yes [provider]  ondansetron  (ZOFRAN -ODT) 8 MG disintegrating tablet TAKE ONE TABLET BY MOUTH EVERY 8 HOURS AS NEEDED FOR NAUSEA AND VOMITING Patient taking differently: Take 8  mg by mouth 2 (two) times daily. 10/28/23  Yes Mahon, Charmaine CROME, NP  oxyCODONE  (ROXICODONE ) 5 MG immediate release tablet Take 1 tablet (5 mg total) by mouth every 6 (six) hours as needed for severe pain (pain score 7-10). 06/19/23  Yes Sebastian Lenis, MD  OXYGEN  Inhale 2 L into the lungs continuous.   Yes [provider]  pantoprazole  (PROTONIX ) 40 MG tablet TAKE ONE TABLET BY MOUTH TWICE DAILY WITH FOOD (BREAKFAST AND SUPPER) Patient taking differently: Take 40 mg by mouth 2 (two) times daily before a meal. TAKE ONE TABLET BY MOUTH TWICE DAILY WITH FOOD (BREAKFAST AND SUPPER) 08/29/23  Yes Rudy Josette RAMAN, PA-C  potassium chloride  SA (KLOR-CON   M) 20 MEQ tablet Take 1 tablet (20 mEq total) by mouth daily as needed. Patient taking differently: Take 20 mEq by mouth daily as needed (Use with Furosemide ). 11/12/22  Yes Miriam Norris, NP  promethazine  (PHENERGAN ) 25 MG tablet TAKE ONE TABLET BY MOUTH EVERY 8 HOURS AS NEEDED FOR NAUSEA AND VOMITING Patient taking differently: Take 25 mg by mouth in the morning and at bedtime. 12/11/23  Yes Harper, Kristen S, PA-C  sacubitril -valsartan  (ENTRESTO ) 24-26 MG TAKE ONE TABLET BY MOUTH TWICE DAILY 08/21/23  Yes Mallipeddi, Vishnu P, MD  spironolactone  (ALDACTONE ) 25 MG tablet TAKE ONE-HALF TABLET BY MOUTH ONCE DAILY 12/31/23  Yes Mallipeddi, Vishnu P, MD  SYMBICORT  160-4.5 MCG/ACT inhaler Take 2 puffs first thing in am and then another 2 puffs about 12 hours later. Patient taking differently: Inhale 2 puffs into the lungs in the morning and at bedtime. Take 2 puffs first thing in am and then another 2 puffs about 12 hours later. 02/04/24  Yes Darlean Ozell NOVAK, MD  temazepam  (RESTORIL ) 30 MG capsule Take 30 mg by mouth at bedtime as needed for sleep. 08/14/23  Yes [provider]  VENTOLIN  HFA 108 (90 Base) MCG/ACT inhaler INHALE TWO PUFFS INTO THE LUNGS EVERY 6 HOURS AS NEEDED FOR SHORTNESS OF BREATH OR FOR WHEEZING Patient taking differently: Inhale 2 puffs into the lungs every 6 (six) hours as needed for shortness of breath or wheezing. 01/15/24  Yes Darlean Ozell NOVAK, MD  Nebulizer System All-In-One MISC 1 Device by Does not apply route as directed. 09/09/22   Vicci Afton CROME, MD    Allergies: Buprenorphine, Penicillins, Aripiprazole, Duloxetine hcl, Seroquel [quetiapine], Cefaclor, Cyclobenzaprine , Doxycycline, Erythromycin, Gabapentin, Ibuprofen, Levofloxacin , Lisinopril, Macrobid [nitrofurantoin monohyd macro], Naproxen, and Pregabalin    Review of Systems  Respiratory:  Positive for cough and chest tightness.   Cardiovascular:  Positive for chest pain.  Neurological:  Positive for syncope and  headaches.  All other systems reviewed and are negative.   Updated Vital Signs BP 98/78   Pulse 72   Temp 98.6 F (37 C) (Oral)   Resp 13   Ht 5' 1 (1.549 m)   Wt 71.1 kg   SpO2 96%   BMI 29.63 kg/m   Physical Exam Vitals and nursing note reviewed.  Constitutional:      Appearance: She is well-developed.  HENT:     Head: Normocephalic.     Comments: Bruising to forehead from fall    Mouth/Throat:     Mouth: Mucous membranes are moist.     Pharynx: Oropharynx is clear.  Eyes:     Extraocular Movements: Extraocular movements intact.     Pupils: Pupils are equal, round, and reactive to light.  Cardiovascular:     Rate and Rhythm: Normal rate and regular rhythm.  Pulmonary:     Effort: Pulmonary effort is normal.     Breath sounds: Wheezing present.  Abdominal:     General: Bowel sounds are normal.     Palpations: Abdomen is soft.  Musculoskeletal:        General: Normal range of motion.     Cervical back: Normal range of motion and neck supple.  Skin:    General: Skin is warm.     Capillary Refill: Capillary refill takes less than 2 seconds.  Neurological:     General: No focal deficit present.     Mental Status: She is alert and oriented to person, place, and time.  Psychiatric:        Mood and Affect: Mood normal.        Behavior: Behavior normal.     (all labs ordered are listed, but only abnormal results are displayed) Labs Reviewed  CBC WITH DIFFERENTIAL/PLATELET - Abnormal; Notable for the following components:      Result Value   WBC 11.3 (*)    RBC 3.75 (*)    Hemoglobin 10.4 (*)    HCT 31.4 (*)    RDW 16.6 (*)    Neutro Abs 8.5 (*)    Abs Immature Granulocytes 0.12 (*)    All other components within normal limits  COMPREHENSIVE METABOLIC PANEL WITH GFR - Abnormal; Notable for the following components:   Sodium 114 (*)    Chloride 80 (*)    Creatinine, Ser 1.18 (*)    Calcium  8.6 (*)    AST 63 (*)    ALT 70 (*)    Alkaline Phosphatase 232  (*)    GFR, Estimated 55 (*)    All other components within normal limits  BASIC METABOLIC PANEL WITH GFR - Abnormal; Notable for the following components:   Sodium 119 (*)    Chloride 81 (*)    Creatinine, Ser 1.11 (*)    Calcium  8.8 (*)    GFR, Estimated 59 (*)    All other components within normal limits  CULTURE, BLOOD (ROUTINE X 2)  CULTURE, BLOOD (ROUTINE X 2)  RESP PANEL BY RT-PCR (RSV, FLU A&B, COVID)  RVPGX2  ETHANOL  LACTIC ACID, PLASMA  LACTIC ACID, PLASMA  URINALYSIS, W/ REFLEX TO CULTURE (INFECTION SUSPECTED)  TROPONIN T, HIGH SENSITIVITY    EKG: EKG Interpretation Date/Time:  Wednesday February 12 2024 15:50:27 EDT Ventricular Rate:  71 PR Interval:  154 QRS Duration:  76 QT Interval:  408 QTC Calculation: 443 R Axis:   79  Text Interpretation: Normal sinus rhythm Low voltage QRS Borderline ECG When compared with ECG of 12-Feb-2024 15:11, No significant change was found Confirmed by Dean Clarity [phone]) on 02/12/2024 9:53:54 PM  Radiology: CT Head Wo Contrast Result Date: 02/12/2024 CLINICAL DATA:  Chest pain short of breath EXAM: CT HEAD WITHOUT CONTRAST TECHNIQUE: Contiguous axial images were obtained from the base of the skull through the vertex without intravenous contrast. RADIATION DOSE REDUCTION: This exam was performed according to the departmental dose-optimization program which includes automated exposure control, adjustment of the mA and/or kV according to patient size and/or use of iterative reconstruction technique. COMPARISON:  CT brain 12/28/2019 FINDINGS: Brain: No evidence of acute infarction, hemorrhage, hydrocephalus, extra-axial collection or mass lesion/mass effect. Vascular: No hyperdense vessel or unexpected calcification. Skull: Normal. Negative for fracture or focal lesion. Sinuses/Orbits: No acute finding. Other: None IMPRESSION: Negative non contrasted CT appearance of the brain. Electronically Signed   By: Luke Scott HERO.D.  On:  02/12/2024 20:09   CT Angio Chest PE W and/or Wo Contrast Result Date: 02/12/2024 CLINICAL DATA:  Elevated D-dimer right-sided chest pain short of breath EXAM: CT ANGIOGRAPHY CHEST WITH CONTRAST TECHNIQUE: Multidetector CT imaging of the chest was performed using the standard protocol during bolus administration of intravenous contrast. Multiplanar CT image reconstructions and MIPs were obtained to evaluate the vascular anatomy. RADIATION DOSE REDUCTION: This exam was performed according to the departmental dose-optimization program which includes automated exposure control, adjustment of the mA and/or kV according to patient size and/or use of iterative reconstruction technique. CONTRAST:  75mL OMNIPAQUE  IOHEXOL  350 MG/ML SOLN COMPARISON:  Chest x-ray 02/12/2024, chest CT 09/27/2023 FINDINGS: Cardiovascular: Satisfactory opacification of the pulmonary arteries to the segmental level. No evidence of pulmonary embolism. Mild atherosclerosis. No aneurysm. Mild coronary vascular calcification. Borderline to mild cardiomegaly. Trace pericardial effusion. Mediastinum/Nodes: Patent trachea. No suspicious thyroid  mass. Interval enlargement of mediastinal lymph nodes, right paratracheal node measures up to 11 mm. Right hilar node measures 14 mm. Esophagus within normal limits. Lungs/Pleura: No pleural effusion or pneumothorax. Patchy ground-glass disease bilaterally appears worse compared with the Sep 27, 2023 CT. No confluent consolidation. Upper Abdomen: No acute finding. Cyst upper pole left kidney for which no imaging follow-up is recommended Musculoskeletal: No acute osseous abnormality Review of the MIP images confirms the above findings. IMPRESSION: 1. Negative for acute pulmonary embolus. 2. Patchy ground-glass disease bilaterally appears worse compared with the Sep 27, 2023 CT. There may be underlying small airways disease and mosaicism but suspect that there is an acute superimposed infectious or inflammatory  process, less likely edema also present. 3. Interval enlargement of mediastinal and right hilar lymph nodes, likely reactive. 4. Aortic atherosclerosis. Aortic Atherosclerosis (ICD10-I70.0). Electronically Signed   By: Luke Bun M.D.   On: 02/12/2024 20:07   DG Chest 2 View Result Date: 02/12/2024 CLINICAL DATA:  Shortness of breath.  Chest pain. EXAM: CHEST - 2 VIEW COMPARISON:  June 22, 2023. FINDINGS: The heart size and mediastinal contours are within normal limits. Mild central pulmonary vascular congestion is noted with mild bilateral perihilar interstitial densities concerning for pulmonary edema or atypical inflammation. The visualized skeletal structures are unremarkable. IMPRESSION: Mild central pulmonary vascular congestion is noted with mild bilateral perihilar interstitial densities concerning for pulmonary edema or atypical inflammation. Electronically Signed   By: Lynwood Landy Raddle M.D.   On: 02/12/2024 16:17     Procedures   Medications Ordered in the ED  cefTRIAXone  (ROCEPHIN ) 1 g in sodium chloride  0.9 % 100 mL IVPB (1 g Intravenous New Bag/Given 02/12/24 2143)  doxycycline (VIBRA-TABS) tablet 100 mg (100 mg Oral Patient Refused/Not Given 02/12/24 2139)  sodium chloride  0.9 % bolus 1,000 mL (has no administration in time range)  morphine  (PF) 4 MG/ML injection 4 mg (4 mg Intravenous Given 02/12/24 1925)  ondansetron  (ZOFRAN ) injection 4 mg (4 mg Intravenous Given 02/12/24 1925)  sodium chloride  0.9 % bolus 1,000 mL (1,000 mLs Intravenous New Bag/Given 02/12/24 1923)  iohexol  (OMNIPAQUE ) 350 MG/ML injection 75 mL (75 mLs Intravenous Contrast Given 02/12/24 1938)  morphine  (PF) 4 MG/ML injection 4 mg (4 mg Intravenous Given 02/12/24 2024)  sodium chloride  0.9 % bolus 1,000 mL (1,000 mLs Intravenous New Bag/Given 02/12/24 2145)                                    Medical Decision Making Amount and/or Complexity of  Data Reviewed Labs: ordered. Radiology:  ordered.  Risk Prescription drug management. Decision regarding hospitalization.   This patient presents to the ED for concern of cp, sob, this involves an extensive number of treatment options, and is a complaint that carries with it a high risk of complications and morbidity.  The differential diagnosis includes PE, CAD, covid/flu/rsv, pna, electrolyte abn   Co morbidities that complicate the patient evaluation   htn, migraines, chronic pain, uterine cancer, GERD, CHF, arthritis, chf, hx PE (not on thinners).   Additional history obtained:  Additional history obtained from epic chart review External records from outside source obtained and reviewed including husband   Lab Tests:  I Ordered, and personally interpreted labs.  The pertinent results include:  cbc with wbc sl elevated at 11.3, hgb 10.4 (stable); cmp with na low at 114 (139 on 2.8); cr sl elevated at 1.18; lfts elevated (chronic); repeat na 119   Imaging Studies ordered:  I ordered imaging studies including ct head, ct chest, cxr  I independently visualized and interpreted imaging which showed  CT head: Negative non contrasted CT appearance of the brain.  CT chest:  Negative for acute pulmonary embolus.  2. Patchy ground-glass disease bilaterally appears worse compared  with the Sep 27, 2023 CT. There may be underlying small airways  disease and mosaicism but suspect that there is an acute  superimposed infectious or inflammatory process, less likely edema  also present.  3. Interval enlargement of mediastinal and right hilar lymph nodes,  likely reactive.  4. Aortic atherosclerosis.    Aortic Atherosclerosis (ICD10-I70.0).   CXR: Mild central pulmonary vascular congestion is noted with mild  bilateral perihilar interstitial densities concerning for pulmonary  edema or atypical inflammation.   I agree with the radiologist interpretation   Cardiac Monitoring:  The patient was maintained on a cardiac  monitor.  I personally viewed and interpreted the cardiac monitored which showed an underlying rhythm of: nsr   Medicines ordered and prescription drug management:  I ordered medication including ivfs/rocephin   for sx  Reevaluation of the patient after these medicines showed that the patient improved I have reviewed the patients home medicines and have made adjustments as needed   Test Considered:  ct   Critical Interventions:  ivfs   Consultations Obtained:  I requested consultation with the hospitalist (Dr. Manfred),  and discussed lab and imaging findings as well as pertinent plan - he will admit   Problem List / ED Course:  Hyponatremia:  new onset.  Unclear etiology.  Possibly lung disease?  Pt is also on lasix . Pt does not drink alcohol.  Pt does not appear fluid overloaded.  She is given ivfs.  CAP:  pt started on rocephin .  She refused doxy saying she's allergic.  Pt allergic to zithromax  and levaquin  and pcns as well.  She is saturating well.   Reevaluation:  After the interventions noted above, I reevaluated the patient and found that they have :improved   Social Determinants of Health:  Lives at home   Dispostion:  After consideration of the diagnostic results and the patients response to treatment, I feel that the patent would benefit from admission.  CRITICAL CARE Performed by: Mliss Boyers   Total critical care time: 30 minutes  Critical care time was exclusive of separately billable procedures and treating other patients.  Critical care was necessary to treat or prevent imminent or life-threatening deterioration.  Critical care was time spent personally by me on the following activities:  development of treatment plan with patient and/or surrogate as well as nursing, discussions with consultants, evaluation of patient's response to treatment, examination of patient, obtaining history from patient or surrogate, ordering and performing treatments  and interventions, ordering and review of laboratory studies, ordering and review of radiographic studies, pulse oximetry and re-evaluation of patient's condition.        Final diagnoses:  Hyponatremia  Syncope, unspecified syncope type  Community acquired pneumonia, unspecified laterality    ED Discharge Orders     None          Dean Clarity, MD 02/12/24 2203

## 2024-02-12 NOTE — ED Notes (Signed)
 Pt attempted urine sample x2. Unable to provide states she is having diarrhea.

## 2024-02-12 NOTE — H&P (Incomplete)
 History and Physical    Patient: [name] FMW:[id number] DOB: 10-Dec-1970 DOA: 02/12/2024 DOS: the patient was seen and examined on 02/13/2024 PCP: Bevely Doffing, FNP  Patient coming from: Home  Chief Complaint:  Chief Complaint  Patient presents with   Shortness of Breath   HPI: [name] is a 53 y.o. female with medical history significant of hypertension, hyperlipidemia, COPD on 2 LPM of oxygen  at home, history of HFrecEF, nonischemic cardiomyopathy, history of PE (not on blood thinners) who presents to the emergency department due to elevated D-dimer, chest pain and shortness of breath.   Patient went to pulmonologist on 9/23, blood work was done and patient saw abnormal result in MyChart , but she was unable to communicate with anyone from the pulmonologist office, she had a follow-up with cardiologist today, positive D-dimer was noted on the recent blood work done and patient was asked to go to the ED for evaluation.  ED Course:  In the emergency department, she was hemodynamically stable.  Workup in the ED showed WBC 11.3, hemoglobin 10.4, hematocrit 31.4, MCV 83.7, platelets 383.  BMP 114, potassium 4.3, chloride 80, bicarb 22, blood glucose 89, BUN 19, creatinine 0.18, calcium  8.6, lactic acid 0.9, troponin x 1 was normal, alcohol level was less than 15.  Influenza A, B, SARS coronavirus 2, RSV was negative. CT head without contrast showed negative noncontrasted CT appearance of the brain CT angiography chest PE with and/or without contrast was negative for acute pulmonary embolus. Patchy ground-glass disease bilaterally appears worse compared with the Sep 27, 2023 CT. There may be underlying small airways disease and mosaicism but suspect that there is an acute superimposed infectious or inflammatory process, less likely edema also present. Patient was treated with IV ceftriaxone  and doxycycline (refused doxycycline due to allergy) Morphine  was given and IV hydration  was provided.  [facility] was asked to admit patient.  Review of Systems: Review of systems as noted in the HPI. All other systems reviewed and are negative.   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   CHF (congestive heart failure) (HCC)    Chronic abdominal pain    Chronic back pain    Chronic kidney disease, stage 1 07/04/2022   Colitis    Constipation    COPD (chronic obstructive pulmonary disease) (HCC) 04/04/2023   Depression 03/14/2023   Dyspnea    with exertion   GERD (gastroesophageal reflux disease)    Gram-negative bacteremia 09/03/2022   History of blood transfusion 08/2022   2 units transfused at [facility]   Hypertension    Insomnia    Kidney cysts    left kidney   MDD (major depressive disorder)    Migraine    Nonischemic cardiomyopathy (HCC) 06/14/2023   Oxygen  dependent    2L via [place] prn 24/7   Plumbism    blood clot    Pneumonia    x several   PONV (postoperative nausea and vomiting)    PTSD (post-traumatic stress disorder)    Pyelonephritis 09/02/2022   Renal mass    Sepsis due to Escherichia coli (E. 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   Uterine cancer [facility])    age 1   Past Surgical History:  Procedure Laterality Date   ABDOMINAL HYSTERECTOMY     BACK SURGERY     BIOPSY  07/07/2021   Procedure: BIOPSY;  Surgeon: Cindie Carlin POUR, DO;  Location: [facility];  Service: Endoscopy;;   CHOLECYSTECTOMY     Complicated by bile leak requiring ERCP with temporary stenting   COLONOSCOPY WITH ESOPHAGOGASTRODUODENOSCOPY (EGD)  07/2009   [facility]: Op notes cannot be seen through care everywhere however pathology showed terminal ileum and random colon biopsies normal.  Stomach biopsy with gastric antral and fundic mucosa with reactive foveolar  hyperplasia.  No active gastritis.  Stains negative for H. pylori.   COLONOSCOPY WITH PROPOFOL  N/A 07/07/2021   Surgeon: Cindie Carlin K, DO;  Non-bleeding internal hemorrhoids, normal TI  biopsied, normal colon biopsied. Pathology with colon and TI biopsies normal.   ESOPHAGEAL BRUSHING  07/07/2021   Procedure: ESOPHAGEAL BRUSHING;  Surgeon: Cindie Carlin POUR, DO;  Location: [facility];  Service: Endoscopy;;   ESOPHAGOGASTRODUODENOSCOPY  06/2007   [facility]: Normal esophagus, gastric mucosal abnormality with erythema, biopsy showed gastric antral and fundic mucosa with reactive foveolar hyperplasia, no gastritis, no H. pylori.   ESOPHAGOGASTRODUODENOSCOPY (EGD) WITH PROPOFOL  N/A 07/07/2021   Surgeon: Cindie Carlin POUR, DO; moderately severe Candida esophagitis, gastritis, biopsies negative for H. pylori, normal examined duodenum.  Prescribed Diflucan  and recommended PPI twice daily.   HAND SURGERY     OPEN REDUCTION INTERNAL FIXATION (ORIF) PROXIMAL PHALANX Right 06/19/2023   Procedure: OPEN REDUCTION INTERNAL FIXATION (ORIF) OF RIGHT SMALL FINGER PROXIMAL PHALANX FRACTURE;  Surgeon: Sebastian Lenis, MD;  Location: [facility] OR;  Service: Orthopedics;  Laterality: Right;    Social History:  reports that she has been smoking cigarettes and e-cigarettes. She has a 20 pack-year smoking history. She has never used smokeless tobacco. She reports that she does not currently use alcohol. She reports that she does not currently use drugs after having used the following drugs: Benzodiazepines and Hydrocodone .   Allergies  Allergen Reactions   Buprenorphine Other (See Comments)    Chest pain/ringing in ears and feet swelling   Penicillins Swelling and Rash    All cillins    Aripiprazole Other (See Comments)    Seizure    Duloxetine Hcl Other (See Comments)    Unknown    Seroquel [Quetiapine] Other (See Comments)    Seizure (08/18/20)   Cefaclor Other (See Comments)    Muscles locked up    Cyclobenzaprine  Other (See Comments)    Dizziness   Doxycycline Rash   Erythromycin Rash   Gabapentin Nausea And Vomiting and Other (See Comments)    Dizzy    Ibuprofen Swelling and Rash   Levofloxacin  Itching and Rash   Lisinopril Nausea And Vomiting   Macrobid [Nitrofurantoin Monohyd Macro] Rash   Naproxen Swelling and Rash   Pregabalin Rash    Family History  Adopted: Yes  Problem Relation Age of Onset   Colon cancer Maternal Grandfather        76s   Colon cancer Maternal Uncle        36s   Cancer Other    Seizures Other    Stroke Other    Diabetes Other      Prior to Admission medications   Medication Sig Start Date End Date Taking? Authorizing Provider  atorvastatin  (LIPITOR) 40 MG tablet Take 1 tablet (40 mg total) by mouth daily. 11/19/22 02/11/97 Yes Miriam Norris, NP  Buprenorphine HCl-Naloxone HCl 8-2 MG FILM Place 1 Film under the tongue  2 (two) times daily. 04/02/23  Yes [provider]  dicyclomine  (BENTYL ) 20 MG tablet TAKE ONE TABLET BY MOUTH THREE TIMES DAILY BEFORE MEALS Patient taking differently: Take 20 mg by mouth 3 (three) times daily before meals. 02/05/24  Yes Mahon, Charmaine CROME, NP  famotidine  (PEPCID ) 20 MG tablet Take 20 mg by mouth daily as needed (For break through heart burn).   Yes [provider]  FLUoxetine  (PROZAC ) 40 MG capsule Take 2 capsules (80 mg total) by mouth daily. 06/14/23 02/11/97 Yes Melvenia Manus BRAVO, MD  furosemide  (LASIX ) 40 MG tablet TAKE ONE TABLET BY MOUTH ONCE DAILY AS NEEDED FOR SWELLING. Patient taking differently: Take 20 mg by mouth daily as needed for fluid or edema. 10/09/23  Yes Mallipeddi, Vishnu P, MD  hydrOXYzine  (ATARAX ) 50 MG tablet TAKE ONE TABLET BY MOUTH UP TO THREE TIMES DAILY AS NEEDED FOR ANXIETY Patient taking differently: Take 50 mg by mouth 3 (three) times daily as needed for anxiety. 06/20/23  Yes Melvenia Manus BRAVO, MD  ipratropium-albuterol  (DUONEB) 0.5-2.5 (3) MG/3ML SOLN Take 3 mLs by  nebulization every 4 (four) hours as needed (SOB, wheezing, coughing). 09/09/22  Yes Johnson, Clanford L, MD  levothyroxine  (SYNTHROID ) 25 MCG tablet Take 1 tablet (25 mcg total) by mouth daily. 01/29/24  Yes Reardon, Benton PARAS, NP  metoprolol  succinate (TOPROL -XL) 100 MG 24 hr tablet TAKE ONE TABLET BY MOUTH ONCE DAILY WITH OR IMMEDIATELY FOLLOWING A MEAL Patient taking differently: Take 100 mg by mouth daily. 12/19/23  Yes Mallipeddi, Vishnu P, MD  nicotine  (NICOTINE  STEP 2) 14 mg/24hr patch Place 1 patch (14 mg total) onto the skin daily. 02/04/24  Yes Darlean Ozell NOVAK, MD  nitroGLYCERIN  (NITROSTAT ) 0.4 MG SL tablet Place 1 tablet (0.4 mg total) under the tongue every 5 (five) minutes as needed for chest pain. 01/24/24  Yes Mallipeddi, Vishnu P, MD  OLANZapine (ZYPREXA) 5 MG tablet Take 5 mg by mouth daily. 01/28/24  Yes [provider]  ondansetron  (ZOFRAN -ODT) 8 MG disintegrating tablet TAKE ONE TABLET BY MOUTH EVERY 8 HOURS AS NEEDED FOR NAUSEA AND VOMITING Patient taking differently: Take 8 mg by mouth 2 (two) times daily. 10/28/23  Yes Mahon, Charmaine CROME, NP  oxyCODONE  (ROXICODONE ) 5 MG immediate release tablet Take 1 tablet (5 mg total) by mouth every 6 (six) hours as needed for severe pain (pain score 7-10). 06/19/23  Yes Sebastian Lenis, MD  OXYGEN  Inhale 2 L into the lungs continuous.   Yes [provider]  pantoprazole  (PROTONIX ) 40 MG tablet TAKE ONE TABLET BY MOUTH TWICE DAILY WITH FOOD (BREAKFAST AND SUPPER) Patient taking differently: Take 40 mg by mouth 2 (two) times daily before a meal. TAKE ONE TABLET BY MOUTH TWICE DAILY WITH FOOD (BREAKFAST AND SUPPER) 08/29/23  Yes Rudy Josette RAMAN, PA-C  potassium chloride  SA (KLOR-CON  M) 20 MEQ tablet Take 1 tablet (20 mEq total) by mouth daily as needed. Patient taking differently: Take 20 mEq by mouth daily as needed (Use with Furosemide ). 11/12/22  Yes Miriam Norris, NP  promethazine  (PHENERGAN ) 25 MG tablet TAKE ONE TABLET BY MOUTH  EVERY 8 HOURS AS NEEDED FOR NAUSEA AND VOMITING Patient taking differently: Take 25 mg by mouth in the morning and at bedtime. 12/11/23  Yes Harper, Kristen S, PA-C  sacubitril -valsartan  (ENTRESTO ) 24-26 MG TAKE ONE TABLET BY MOUTH TWICE DAILY 08/21/23  Yes Mallipeddi, Vishnu P, MD  spironolactone  (ALDACTONE ) 25 MG tablet TAKE ONE-HALF TABLET BY MOUTH ONCE DAILY 12/31/23  Yes Mallipeddi, Vishnu  P, MD  SYMBICORT  160-4.5 MCG/ACT inhaler Take 2 puffs first thing in am and then another 2 puffs about 12 hours later. Patient taking differently: Inhale 2 puffs into the lungs in the morning and at bedtime. Take 2 puffs first thing in am and then another 2 puffs about 12 hours later. 02/04/24  Yes Darlean Ozell NOVAK, MD  temazepam  (RESTORIL ) 30 MG capsule Take 30 mg by mouth at bedtime as needed for sleep. 08/14/23  Yes [provider]  VENTOLIN  HFA 108 (90 Base) MCG/ACT inhaler INHALE TWO PUFFS INTO THE LUNGS EVERY 6 HOURS AS NEEDED FOR SHORTNESS OF BREATH OR FOR WHEEZING Patient taking differently: Inhale 2 puffs into the lungs every 6 (six) hours as needed for shortness of breath or wheezing. 01/15/24  Yes Darlean Ozell NOVAK, MD  Nebulizer System All-In-One MISC 1 Device by Does not apply route as directed. 09/09/22   Vicci Afton CROME, MD    Physical Exam: BP (!) 93/56   Pulse 76   Temp 98.6 F (37 C) (Oral)   Resp 15   Ht 5' 1 (1.549 m)   Wt 71.1 kg   SpO2 98%   BMI 29.63 kg/m   General: 53 y.o. year-old female well developed well nourished in no acute distress.  Alert and oriented x3. HEENT: NCAT, EOMI Neck: Supple, trachea medial Cardiovascular: Regular rate and rhythm with no rubs or gallops.  No thyromegaly or JVD noted.  No lower extremity edema. 2/4 pulses in all 4 extremities. Respiratory: Clear to auscultation with no wheezes or rales. Good inspiratory effort. Abdomen: Soft, nontender nondistended with normal bowel sounds x4 quadrants. Muskuloskeletal: No cyanosis, clubbing or edema  noted bilaterally Neuro: CN II-XII intact, strength 5/5 x 4, sensation, reflexes intact Skin: No ulcerative lesions noted or rashes Psychiatry: Judgement and insight appear normal. Mood is appropriate for condition and setting          Labs on Admission:  Basic Metabolic Panel: Recent Labs  Lab 02/12/24 1624 02/12/24 1852  NA 114* 119*  K 4.3 4.0  CL 80* 81*  CO2 22 24  GLUCOSE 89 88  BUN 19 18  CREATININE 1.18* 1.11*  CALCIUM  8.6* 8.8*   Liver Function Tests: Recent Labs  Lab 02/12/24 1624  AST 63*  ALT 70*  ALKPHOS 232*  BILITOT <0.2  PROT 7.7  ALBUMIN 3.8   No results for input(s): LIPASE, AMYLASE in the last 168 hours. No results for input(s): AMMONIA in the last 168 hours. CBC: Recent Labs  Lab 02/12/24 1552 02/13/24 0531  WBC 11.3* 6.6  NEUTROABS 8.5*  --   HGB 10.4* 8.4*  HCT 31.4* 26.2*  MCV 83.7 85.6  PLT 383 338   Cardiac Enzymes: No results for input(s): CKTOTAL, CKMB, CKMBINDEX, TROPONINI in the last 168 hours.  BNP (last 3 results) No results for input(s): BNP in the last 8760 hours.  ProBNP (last 3 results) No results for input(s): PROBNP in the last 8760 hours.  CBG: No results for input(s): GLUCAP in the last 168 hours.  Radiological Exams on Admission: CT Head Wo Contrast Result Date: 02/12/2024 CLINICAL DATA:  Chest pain short of breath EXAM: CT HEAD WITHOUT CONTRAST TECHNIQUE: Contiguous axial images were obtained from the base of the skull through the vertex without intravenous contrast. RADIATION DOSE REDUCTION: This exam was performed according to the departmental dose-optimization program which includes automated exposure control, adjustment of the mA and/or kV according to patient size and/or use of iterative reconstruction technique. COMPARISON:  CT brain 12/28/2019 FINDINGS: Brain: No evidence of acute infarction, hemorrhage, hydrocephalus, extra-axial collection or mass lesion/mass effect. Vascular: No  hyperdense vessel or unexpected calcification. Skull: Normal. Negative for fracture or focal lesion. Sinuses/Orbits: No acute finding. Other: None IMPRESSION: Negative non contrasted CT appearance of the brain. Electronically Signed   By: Luke Bun M.D.   On: 02/12/2024 20:09   CT Angio Chest PE W and/or Wo Contrast Result Date: 02/12/2024 CLINICAL DATA:  Elevated D-dimer right-sided chest pain short of breath EXAM: CT ANGIOGRAPHY CHEST WITH CONTRAST TECHNIQUE: Multidetector CT imaging of the chest was performed using the standard protocol during bolus administration of intravenous contrast. Multiplanar CT image reconstructions and MIPs were obtained to evaluate the vascular anatomy. RADIATION DOSE REDUCTION: This exam was performed according to the departmental dose-optimization program which includes automated exposure control, adjustment of the mA and/or kV according to patient size and/or use of iterative reconstruction technique. CONTRAST:  75mL OMNIPAQUE  IOHEXOL  350 MG/ML SOLN COMPARISON:  Chest x-ray 02/12/2024, chest CT 09/27/2023 FINDINGS: Cardiovascular: Satisfactory opacification of the pulmonary arteries to the segmental level. No evidence of pulmonary embolism. Mild atherosclerosis. No aneurysm. Mild coronary vascular calcification. Borderline to mild cardiomegaly. Trace pericardial effusion. Mediastinum/Nodes: Patent trachea. No suspicious thyroid  mass. Interval enlargement of mediastinal lymph nodes, right paratracheal node measures up to 11 mm. Right hilar node measures 14 mm. Esophagus within normal limits. Lungs/Pleura: No pleural effusion or pneumothorax. Patchy ground-glass disease bilaterally appears worse compared with the Sep 27, 2023 CT. No confluent consolidation. Upper Abdomen: No acute finding. Cyst upper pole left kidney for which no imaging follow-up is recommended Musculoskeletal: No acute osseous abnormality Review of the MIP images confirms the above findings. IMPRESSION: 1.  Negative for acute pulmonary embolus. 2. Patchy ground-glass disease bilaterally appears worse compared with the Sep 27, 2023 CT. There may be underlying small airways disease and mosaicism but suspect that there is an acute superimposed infectious or inflammatory process, less likely edema also present. 3. Interval enlargement of mediastinal and right hilar lymph nodes, likely reactive. 4. Aortic atherosclerosis. Aortic Atherosclerosis (ICD10-I70.0). Electronically Signed   By: Luke Bun M.D.   On: 02/12/2024 20:07   DG Chest 2 View Result Date: 02/12/2024 CLINICAL DATA:  Shortness of breath.  Chest pain. EXAM: CHEST - 2 VIEW COMPARISON:  June 22, 2023. FINDINGS: The heart size and mediastinal contours are within normal limits. Mild central pulmonary vascular congestion is noted with mild bilateral perihilar interstitial densities concerning for pulmonary edema or atypical inflammation. The visualized skeletal structures are unremarkable. IMPRESSION: Mild central pulmonary vascular congestion is noted with mild bilateral perihilar interstitial densities concerning for pulmonary edema or atypical inflammation. Electronically Signed   By: Lynwood Landy Raddle M.D.   On: 02/12/2024 16:17    EKG: I independently viewed the EKG done and my findings are as followed: Normal sinus rhythm at rate of 71 bpm  Assessment/Plan Present on Admission:  Hyponatremia  CAP (community acquired pneumonia)  Gastroesophageal reflux disease  COPD (chronic obstructive pulmonary disease) (HCC)  Chronic respiratory failure with hypoxia (HCC)  Essential hypertension  Principal Problem:   Hyponatremia Active Problems:   Gastroesophageal reflux disease   CAP (community acquired pneumonia)   Essential hypertension   COPD (chronic obstructive pulmonary disease) (HCC)   Chronic respiratory failure with hypoxia (HCC)   Acquired hypothyroidism   Heart failure with recovered ejection fraction (HFrecEF)  (HCC)  Hyponatremia Na 114 > 119 This may be due to patient's diuretics (Lasix  and spironolactone ) Patient  already received 3 L of NS, BMP will be checked (pending) prior to continuing with gentle hydration to ensure no rapid correction of sodium levels so as to avoid risk of osmotic demyelination syndrome Continue to monitor sodium with serial BMPs Urine osmolality, serum osmolality and urine sodium will be checked  Presumed CAP POA CT angiography of chest was suggestive of acute superimposed infectious process Patient was empirically treated with IV ceftriaxone , she refused doxycycline due to allergies (patient is also allergic to macrolides and levofloxacin ).  We shall continue with ceftriaxone  at this time with plan to de-escalate/discontinue based on blood culture, sputum culture, urine Legionella, strep pneumo and procalcitonin Continue Tylenol  as needed Continue Mucinex, incentive spirometry, flutter valve   GERD Continue Protonix  Continue Pepcid  as needed  COPD (not in acute exacerbation) Continue DuoNeb as needed  Chronic respiratory failure with hypoxia Continue supplemental oxygen  per home regimen  Acquired hypothyroidism Continue Synthroid   Essential hypertension BP meds will be held at this time due to soft BP/hypotension  HFrecEF Home meds will be held at this time due to soft BP/hypotension   DVT prophylaxis: Lovenox   Code Status: Full code  Family Communication: None at bedside   Consults: None  Severity of Illness: The appropriate patient status for this patient is INPATIENT. Inpatient status is judged to be reasonable and necessary in order to provide the required intensity of service to ensure the patient's safety. The patient's presenting symptoms, physical exam findings, and initial radiographic and laboratory data in the context of their chronic comorbidities is felt to place them at high risk for further clinical deterioration. Furthermore, it is not  anticipated that the patient will be medically stable for discharge from the hospital within 2 midnights of admission.   * I certify that at the point of admission it is my clinical judgment that the patient will require inpatient hospital care spanning beyond 2 midnights from the point of admission due to high intensity of service, high risk for further deterioration and high frequency of surveillance required.*  Author: Markisha Meding, DO 02/13/2024 6:17 AM  For on call review [email or web address].

## 2024-02-12 NOTE — Progress Notes (Signed)
 Cardiology Office Note:  .   Date: 02/12/2024 ID:  Kenneth JINNY Burnet, DOB 19-Mar-1971, MRN [id number] PCP: Bevely Doffing, FNP  [facility] Providers Cardiologist:  Diannah SHAUNNA Maywood, MD    History of Present Illness: ZENAIDA TESAR is a 53 y.o. female with a PMH of HFrecEF, HTN, IBS, chronic abdominal pain/nausea, GERD, colitis, and tobacco abuse, hx of PE in 2019, COPD, who presents today for 4-6 week follow-up.   Saw her on Sep 24, 2022. Doing well from a cardiac perspective. Completed IV ABX x 3 days through PICC line after returning home for recent hospital admission for severe sepsis d/t UTI, E. coli bacteremia, pyelonephritis, colonic wall thickening/ colitis, and lobar pneumonia. Updated Echo revealed recovered EF 10/03/2022.   I last saw patient on November 12, 2022 for follow-up.  Was doing well at the time.  Was noted she had a mechanical fall about 2 nights prior to office visit, fell off a stepstool and oxygen  tank landed on right foot, wonder if she has broken some toes.    07/05/2023 - Today she presents for follow-up.  She tells me she had another fall about 10 days ago and tells me she slipped in her tub in her bathroom.  She injured her right hand and has had surgery on this right hand.  She also wonders if she injured her left leg.  Chief concern today is fatigue and insomnia.  She also admits to some dyspnea on exertion with walking and going up and down stairs.  She tells me that she will sometimes go 2 to 3 days without sleeping.  STOP-BANG score 4.  She has never been tested for sleep apnea.  She says she is in between psychiatrists and PCP has deferred sleep medication to her psychiatrist. Denies any chest pain, palpitations, syncope, presyncope, dizziness, orthopnea, PND, swelling or significant weight changes, acute bleeding, or claudication.  12/31/2023 - Here for follow-up. She is wearing oxygen  via nasal cannular for this office visit and tells me she uses this as  needed, also used this as needed last year. Admits to significant fatigue. She has had several falls since I have last seen her, she broke her right hand and has had surgery for this. Follow orthopedic surgery. She admits to weight gain and swelling, feels as though she has more shortness of breath and chronic chest congestion. Taking Lasix  as needed. Seeing a therapist. Denies any chest pain, palpitations, syncope, presyncope, dizziness, orthopnea, PND, acute bleeding, or claudication.  02/12/2024 -she is here for follow-up today with her husband.  Patient admits to feeling very tired.  She did have a recent episode of chest tightness along the right upper side of her chest that lasted for a few days, she noticed this when laying down and when going to bed at night.  [place] this concerned her because this is exactly how she felt in the past when she had a blood clot in the past.  Tells me she has had a previous left lower lung PE in the past.  Denies any recurrent chest pain since then.  Does admit to some pain with deep inspiration as well as worsening shortness of breath. Has been followed by Pulmonology with recent D-dimer of 1.18. CBC showing hemoglobin of 9.9, hematocrit 32.5.  A-1 antitrypsin 220. Denies any palpitations, syncope, presyncope, dizziness, orthopnea, PND, swelling or significant weight changes, acute bleeding, or claudication.   Studies Reviewed: [name]     EKG:  EKG  Interpretation Date/Time:  Wednesday February 12 2024 15:11:43 EDT Ventricular Rate:  68 PR Interval:  160 QRS Duration:  78 QT Interval:  418 QTC Calculation: 444 R Axis:   79  Text Interpretation: Normal sinus rhythm Normal ECG When compared with ECG of 31-Dec-2023 13:59, No significant change was found Confirmed by Miriam Norris [phone]) on 02/12/2024 3:13:36 PM    Limited Echo 09/2022:   1. Left ventricular ejection fraction, by estimation, is 60 to 65%. The  left ventricle has normal function. The left ventricle has  no regional  wall motion abnormalities. Left ventricular diastolic parameters are  indeterminate. The average left  ventricular global longitudinal strain is -21.4 %. The global longitudinal  strain is normal.   2. Right ventricular systolic function is normal. The right ventricular  size is normal.   3. Limited echo evaluate LV function   Comparison(s): Echocardiogram done 09/05/22 showed an EF of 25-30%.  Physical Exam:   VS:  BP 130/80 (BP Location: Left Arm, Patient Position: Sitting, Cuff Size: Normal)   Pulse 72   Ht 5' 1 (1.549 m)   Wt 156 lb 12.8 oz (71.1 kg)   BMI 29.63 kg/m    Wt Readings from Last 3 Encounters:  02/12/24 156 lb 12.8 oz (71.1 kg)  02/04/24 153 lb 9.6 oz (69.7 kg)  01/28/24 152 lb 12.8 oz (69.3 kg)    GEN: Well nourished, well developed in no acute distress, appears fatigued, pale in appearance NECK: No JVD; No carotid bruits CARDIAC: S1/S2, RRR, no murmurs, rubs, gallops RESPIRATORY:  Clear and diminished to auscultation without rales, wheezing or rhonchi  ABDOMEN: Soft, non-tender, non-distended EXTREMITIES: No edema, no deformity   ASSESSMENT AND PLAN: .    1. Elevated D-dimer, shortness of breath, chest pain Recent episode of chest pain lasting a few days, says she did take nitroglycerin  to help with these episodes, did make her sleepy.  EKG is reasurring today and no acute ischemic changes noted. Pt is concerned that this is the same exact feeling she had when she had a left lower lung PE per her report.  She also does note worsening shortness of breath.  She is not on blood thinners.  Recent D-dimer elevated.  I recommended she proceed to [facility] to be evaluated for PE.  She verbalized understanding.  Husband will be driving her to [facility] ED to be evaluated today.  I offered EMS transport and patient declines.  2. HFrecEF, nonischemic cardiomyopathy Stage C, class II-III symptoms. EF recovered to 60-65% in May 2024. Wt is down from last  office visit.  Based on her worsening shortness of breath, abnormal D-dimer, and recent episode of chest pain, I recommended she proceed to [facility] emergency department to be evaluated for acute PE.  No medication changes at this time. Not a candidate for SGLT2i d/t past hx of sepsis. Low sodium diet, fluid restriction <2L, and daily weights encouraged. Educated to contact our office for weight gain of 2 lbs overnight or 5 lbs in one week. Heart healthy diet and regular cardiovascular exercise encouraged.   Dispo: Follow-up with MD/APP in 2 to 3 weeks after hospital discharge or sooner if anything changes.  Signed, Norris Miriam, NP

## 2024-02-12 NOTE — Patient Instructions (Addendum)
 Medication Instructions:  Your physician recommends that you continue on your current medications as directed. Please refer to the Current Medication list given to you today.  Labwork: none  Testing/Procedures: none  Follow-Up: Your physician recommends that you schedule a follow-up appointment in: after [facility] ED discharge  Any Other Special Instructions Will Be Listed Below (If Applicable).  If you need a refill on your cardiac medications before your next appointment, please call your pharmacy.

## 2024-02-12 NOTE — ED Notes (Signed)
 Date and time results received: 02/12/24 1727   Test: Na+ Critical Value: 114  Name of Provider Notified: Dean PARAS MD

## 2024-02-12 NOTE — ED Triage Notes (Signed)
 Pt arrived via POV following cardiology appointment today for concern of PE. Pt reports recent elevated D-Dimer form pulmonology and reports upper right chest stabbing sharp pain with SOB. Pt reports Hx of blood clots and is not on a blood thinner. Pt reports symptoms began a few days ago.

## 2024-02-12 NOTE — ED Notes (Signed)
 Pt asked for pain and cough medicine. Hospitalist Adefeso made aware

## 2024-02-13 ENCOUNTER — Inpatient Hospital Stay (HOSPITAL_COMMUNITY)

## 2024-02-13 ENCOUNTER — Other Ambulatory Visit: Payer: Self-pay | Admitting: Gastroenterology

## 2024-02-13 DIAGNOSIS — I10 Essential (primary) hypertension: Secondary | ICD-10-CM

## 2024-02-13 DIAGNOSIS — I5020 Unspecified systolic (congestive) heart failure: Secondary | ICD-10-CM | POA: Insufficient documentation

## 2024-02-13 DIAGNOSIS — E039 Hypothyroidism, unspecified: Secondary | ICD-10-CM | POA: Insufficient documentation

## 2024-02-13 DIAGNOSIS — E871 Hypo-osmolality and hyponatremia: Secondary | ICD-10-CM | POA: Diagnosis not present

## 2024-02-13 DIAGNOSIS — J189 Pneumonia, unspecified organism: Secondary | ICD-10-CM | POA: Diagnosis not present

## 2024-02-13 DIAGNOSIS — K219 Gastro-esophageal reflux disease without esophagitis: Secondary | ICD-10-CM

## 2024-02-13 LAB — CBC
HCT: 26.2 % — ABNORMAL LOW (ref 36.0–46.0)
Hemoglobin: 8.4 g/dL — ABNORMAL LOW (ref 12.0–15.0)
MCH: 27.5 pg (ref 26.0–34.0)
MCHC: 32.1 g/dL (ref 30.0–36.0)
MCV: 85.6 fL (ref 80.0–100.0)
Platelets: 338 K/uL (ref 150–400)
RBC: 3.06 MIL/uL — ABNORMAL LOW (ref 3.87–5.11)
RDW: 16.6 % — ABNORMAL HIGH (ref 11.5–15.5)
WBC: 6.6 K/uL (ref 4.0–10.5)
nRBC: 0 % (ref 0.0–0.2)

## 2024-02-13 LAB — HIV ANTIBODY (ROUTINE TESTING W REFLEX): HIV Screen 4th Generation wRfx: NONREACTIVE

## 2024-02-13 LAB — URINE DRUG SCREEN
Amphetamines: NEGATIVE
Barbiturates: NEGATIVE
Benzodiazepines: NEGATIVE
Cocaine: NEGATIVE
Fentanyl: NEGATIVE
Methadone Scn, Ur: NEGATIVE
Opiates: NEGATIVE
Tetrahydrocannabinol: NEGATIVE

## 2024-02-13 LAB — COMPREHENSIVE METABOLIC PANEL WITH GFR
ALT: 59 U/L — ABNORMAL HIGH (ref 0–44)
AST: 63 U/L — ABNORMAL HIGH (ref 15–41)
Albumin: 3.1 g/dL — ABNORMAL LOW (ref 3.5–5.0)
Alkaline Phosphatase: 192 U/L — ABNORMAL HIGH (ref 38–126)
Anion gap: 12 (ref 5–15)
BUN: 11 mg/dL (ref 6–20)
CO2: 21 mmol/L — ABNORMAL LOW (ref 22–32)
Calcium: 8.1 mg/dL — ABNORMAL LOW (ref 8.9–10.3)
Chloride: 98 mmol/L (ref 98–111)
Creatinine, Ser: 0.88 mg/dL (ref 0.44–1.00)
GFR, Estimated: >60 mL/min (ref >60)
Glucose, Bld: 91 mg/dL (ref 70–99)
Potassium: 4.1 mmol/L (ref 3.5–5.1)
Sodium: 131 mmol/L — ABNORMAL LOW (ref 135–145)
Total Bilirubin: <0.2 mg/dL (ref 0.0–1.2)
Total Protein: 6.2 g/dL — ABNORMAL LOW (ref 6.5–8.1)

## 2024-02-13 LAB — URINALYSIS, W/ REFLEX TO CULTURE (INFECTION SUSPECTED)
Bacteria, UA: NONE SEEN
Bilirubin Urine: NEGATIVE
Glucose, UA: NEGATIVE mg/dL
Hgb urine dipstick: NEGATIVE
Ketones, ur: NEGATIVE mg/dL
Leukocytes,Ua: NEGATIVE
Nitrite: NEGATIVE
Protein, ur: NEGATIVE mg/dL
Specific Gravity, Urine: 1.008 (ref 1.005–1.030)
pH: 6 (ref 5.0–8.0)

## 2024-02-13 LAB — RESP PANEL BY RT-PCR (RSV, FLU A&B, COVID)  RVPGX2
Influenza A by PCR: NEGATIVE
Influenza B by PCR: NEGATIVE
Resp Syncytial Virus by PCR: NEGATIVE
SARS Coronavirus 2 by RT PCR: NEGATIVE

## 2024-02-13 LAB — STREP PNEUMONIAE URINARY ANTIGEN: Strep Pneumo Urinary Antigen: NEGATIVE

## 2024-02-13 LAB — PROCALCITONIN: Procalcitonin: <0.1 ng/mL

## 2024-02-13 LAB — PHOSPHORUS: Phosphorus: 2.9 mg/dL (ref 2.5–4.6)

## 2024-02-13 LAB — MAGNESIUM: Magnesium: 1.9 mg/dL (ref 1.7–2.4)

## 2024-02-13 LAB — OSMOLALITY, URINE: Osmolality, Ur: 139 mosm/kg — ABNORMAL LOW (ref 300–900)

## 2024-02-13 LAB — OSMOLALITY: Osmolality: 275 mosm/kg (ref 275–295)

## 2024-02-13 MED ORDER — OXYCODONE HCL 5 MG PO TABS
5.0000 mg | ORAL_TABLET | Freq: Four times a day (QID) | ORAL | Status: DC | PRN
  Administered 2024-02-13: 5 mg via ORAL
  Filled 2024-02-13 (×2): qty 1

## 2024-02-13 MED ORDER — PANTOPRAZOLE SODIUM 40 MG PO TBEC: 40.0000 mg | DELAYED_RELEASE_TABLET | Freq: Two times a day (BID) | ORAL | Status: DC

## 2024-02-13 MED ORDER — HYDROXYZINE HCL 25 MG PO TABS: 50.0000 mg | ORAL_TABLET | Freq: Three times a day (TID) | ORAL | Status: DC | PRN

## 2024-02-13 MED ORDER — ONDANSETRON HCL 4 MG/2ML IJ SOLN: 4.0000 mg | Freq: Four times a day (QID) | INTRAMUSCULAR | Status: DC | PRN

## 2024-02-13 MED ORDER — LACTATED RINGERS IV BOLUS
500.0000 mL | Freq: Once | INTRAVENOUS | Status: AC
  Administered 2024-02-13: 500 mL via INTRAVENOUS

## 2024-02-13 MED ORDER — SODIUM CHLORIDE 0.9 % IV SOLN
1.0000 g | INTRAVENOUS | Status: DC
  Filled 2024-02-13: qty 10

## 2024-02-13 MED ORDER — FAMOTIDINE 20 MG PO TABS: 20.0000 mg | ORAL_TABLET | Freq: Every day | ORAL | Status: DC | PRN

## 2024-02-13 MED ORDER — DICYCLOMINE HCL 10 MG PO CAPS: 20.0000 mg | ORAL_CAPSULE | Freq: Three times a day (TID) | ORAL | Status: DC

## 2024-02-13 MED ORDER — DEXTROSE 5 % IV SOLN
INTRAVENOUS | Status: DC
Start: 2024-02-13 — End: 2024-02-13

## 2024-02-13 MED ORDER — TEMAZEPAM 15 MG PO CAPS: 30.0000 mg | ORAL_CAPSULE | Freq: Every evening | ORAL | Status: DC | PRN

## 2024-02-13 MED ORDER — ONDANSETRON HCL 4 MG PO TABS: 4.0000 mg | ORAL_TABLET | Freq: Four times a day (QID) | ORAL | Status: DC | PRN

## 2024-02-13 MED ORDER — OLANZAPINE 5 MG PO TABS
5.0000 mg | ORAL_TABLET | Freq: Every day | ORAL | Status: DC
  Filled 2024-02-13: qty 1

## 2024-02-13 MED ORDER — ENOXAPARIN SODIUM 40 MG/0.4ML IJ SOSY
40.0000 mg | PREFILLED_SYRINGE | INTRAMUSCULAR | Status: DC
  Filled 2024-02-13: qty 0.4

## 2024-02-13 MED ORDER — FLUTICASONE FUROATE-VILANTEROL 200-25 MCG/ACT IN AEPB: 1.0000 | INHALATION_SPRAY | Freq: Every day | RESPIRATORY_TRACT | Status: DC

## 2024-02-13 MED ORDER — FLUOXETINE HCL 20 MG PO CAPS
80.0000 mg | ORAL_CAPSULE | Freq: Every day | ORAL | Status: DC
Start: 2024-02-13 — End: 2024-02-13
  Filled 2024-02-13 (×3): qty 4

## 2024-02-13 MED ORDER — NICOTINE 14 MG/24HR TD PT24
14.0000 mg | MEDICATED_PATCH | Freq: Every day | TRANSDERMAL | Status: DC
  Filled 2024-02-13: qty 1

## 2024-02-13 MED ORDER — CHLORHEXIDINE GLUCONATE CLOTH 2 % EX PADS: 6.0000 | MEDICATED_PAD | Freq: Every day | CUTANEOUS | Status: DC

## 2024-02-13 MED ORDER — ACETAMINOPHEN 650 MG RE SUPP: 650.0000 mg | Freq: Four times a day (QID) | RECTAL | Status: DC | PRN

## 2024-02-13 MED ORDER — DESMOPRESSIN ACETATE 4 MCG/ML IJ SOLN
2.0000 ug | Freq: Once | INTRAMUSCULAR | Status: AC
  Administered 2024-02-13: 2 ug via INTRAVENOUS
  Filled 2024-02-13: qty 0.5

## 2024-02-13 MED ORDER — LEVOTHYROXINE SODIUM 50 MCG PO TABS: 25.0000 ug | ORAL_TABLET | Freq: Every day | ORAL | Status: DC

## 2024-02-13 MED ORDER — DESMOPRESSIN ACETATE 4 MCG/ML IJ SOLN: 2.0000 ug | Freq: Two times a day (BID) | INTRAMUSCULAR | Status: DC

## 2024-02-13 MED ORDER — DM-GUAIFENESIN ER 30-600 MG PO TB12
1.0000 | ORAL_TABLET | Freq: Two times a day (BID) | ORAL | Status: DC
  Filled 2024-02-13: qty 1

## 2024-02-13 MED ORDER — IPRATROPIUM-ALBUTEROL 0.5-2.5 (3) MG/3ML IN SOLN: 3.0000 mL | RESPIRATORY_TRACT | Status: DC | PRN

## 2024-02-13 MED ORDER — ATORVASTATIN CALCIUM 40 MG PO TABS: 40.0000 mg | ORAL_TABLET | Freq: Every day | ORAL | Status: DC

## 2024-02-13 MED ORDER — ACETAMINOPHEN 325 MG PO TABS: 650.0000 mg | ORAL_TABLET | Freq: Four times a day (QID) | ORAL | Status: DC | PRN

## 2024-02-13 NOTE — Progress Notes (Addendum)
 [facility] PROGRESS NOTE   [name] FMW:[id number] DOB: March 22, 1971 DOA: 02/12/2024  PCP: Bevely Doffing, FNP  Brief History: 53 y.o. female with medical history significant of hypertension, hyperlipidemia, COPD on 2 LPM of oxygen  at home, history of HFrecEF, nonischemic cardiomyopathy, history of PE (not on blood thinners) who presents to the emergency department due to elevated D-dimer, chest pain and shortness of breath. Patient went to pulmonologist on 9/23, blood work was done and patient saw abnormal result in MyChart , but she was unable to communicate with anyone from the pulmonologist office, she had a follow-up with cardiologist today, positive D-dimer was noted on the recent blood work done and patient was asked to go to the ED for evaluation.  She underwent CT angiogram of the chest.  PE was ruled out but she was found to have patchy opacities.  She was hospitalized for further management.  Consultants: None  Procedures: None    Subjective/Interval History: Feels slightly better this morning.  Still has a cough.  Denies any blood in the sputum.  Occasional left-sided chest pain is present.  No nausea vomiting.  No dizziness or lightheadedness.    Assessment/Plan:  Hyponatremia Was noted to have a sodium level of 114.  Sodium was 139 in February.  No labs noted in Care Everywhere. Patient mentions that due to lower extremity edema the dose of her diuretics were increased recently.  Hyponatremia is most likely acute.  Most likely due to diuretic use. Patient was given normal saline boluses.  Looks like she was given 3 L of normal saline along with 500 mL bolus of LR.  Sodium level increased from 1 14-1 19-1 31 this morning.  Overcorrection of sodium noted.  Patient has been given a dose of desmopressin.  She was been started on D5 infusion.  Will recheck sodium levels later today and tonight and then tomorrow again.   Urine osmolality and urine sodium level is  pending.  Community-acquired pneumonia COVID-19 influenza and RSV PCR negative. Patient came in with chest pain.  PE was ruled out.  No concern for pulmonary edema but concern for pneumonia was raised.  Could also be inflammatory in origin.  Since her WBC is normal and procalcitonin is unremarkable we will check inflammatory markers. Procalcitonin level is less than 0.1.  Continue with ceftriaxone  for now.  Doxycycline was also ordered but she refused due to allergy.  Allergic to macrolides as well. Does not seem to have any oxygen  requirements.  She mentioned that she does use oxygen  mainly at nighttime at home. Follow-up with pulmonology at discharge  Chronic respiratory failure with hypoxia On nocturnal oxygen  at home.  Normocytic anemia Drop in hemoglobin is likely dilutional.  No evidence of overt bleeding.  Continue to monitor.  Acquired hypothyroidism Continue levothyroxine .  Essential hypertension Low blood pressures noted.  Antihypertensives on hold.  Chronic systolic CHF EF was 25 to 30% in 2024.  Within a month of that EF had recovered. Followed by cardiology.  Diuretics on hold due to hyponatremia.  Other cardiac medications on hold due to hypotension.  No active cardiac issues currently.  Abnormal LFTs Elevated alkaline phosphatase, AST and ALT noted.  Old labs reviewed and she has always had some degree of abnormal LFTs previously.  Will check hepatitis panel.  Right upper quadrant ultrasound.  No recent CT scan or ultrasound reports available.   DVT Prophylaxis: Lovenox  Code Status: Full code Family Communication: Discussed with patient Disposition Plan: Hopefully return home  when improved  Status is: Inpatient Remains inpatient appropriate because: Hyponatremia, pneumonia      Medications: Scheduled:  dextromethorphan -guaiFENesin  1 tablet Oral BID   doxycycline  100 mg Oral Once   enoxaparin  (LOVENOX ) injection  40 mg Subcutaneous Q24H   Continuous:   cefTRIAXone  (ROCEPHIN )  IV     dextrose  70 mL/hr at 02/13/24 0757   PRN:acetaminophen  **OR** acetaminophen , ondansetron  **OR** ondansetron  (ZOFRAN ) IV, oxyCODONE   Antibiotics: Anti-infectives (From admission, onward)    Start     Dose/Rate Route Frequency Ordered Stop   02/13/24 1000  cefTRIAXone  (ROCEPHIN ) 1 g in sodium chloride  0.9 % 100 mL IVPB        1 g 200 mL/hr over 30 Minutes Intravenous Every 24 hours 02/13/24 0549     02/12/24 2130  cefTRIAXone  (ROCEPHIN ) 1 g in sodium chloride  0.9 % 100 mL IVPB        1 g 200 mL/hr over 30 Minutes Intravenous  Once 02/12/24 2119 02/12/24 2213   02/12/24 2130  doxycycline (VIBRA-TABS) tablet 100 mg        100 mg Oral  Once 02/12/24 2119         Objective:  Vital Signs  Vitals:   02/13/24 0645 02/13/24 0700 02/13/24 0715 02/13/24 0729  BP: (!) 67/41 (!) 91/48 115/61   Pulse: 66 70 80   Resp: 18 12 18    Temp:    98.9 F (37.2 C)  TempSrc:    Oral  SpO2: 100% 100% 100%   Weight:      Height:        Intake/Output Summary (Last 24 hours) at 02/13/2024 0901 Last data filed at 02/13/2024 0336 Gross per 24 hour  Intake 3552.25 ml  Output --  Net 3552.25 ml   Filed Weights   02/12/24 1550  Weight: 71.1 kg    General appearance: Awake alert.  In no distress Resp: Normal effort at rest.  Crackles bilateral bases.  No wheezing or rhonchi. Cardio: S1-S2 is normal regular.  No S3-S4.  No rubs murmurs or bruit GI: Abdomen is soft.  Nontender nondistended.  Bowel sounds are present normal.  No masses organomegaly Extremities: No edema.  Full range of motion of lower extremities. Neurologic: Alert and oriented x3.  No focal neurological deficits.    Lab Results:  Data Reviewed: I have personally reviewed following labs and reports of the imaging studies  CBC: Recent Labs  Lab 02/12/24 1552 02/13/24 0531  WBC 11.3* 6.6  NEUTROABS 8.5*  --   HGB 10.4* 8.4*  HCT 31.4* 26.2*  MCV 83.7 85.6  PLT 383 338    Basic Metabolic  Panel: Recent Labs  Lab 02/12/24 1624 02/12/24 1852 02/13/24 0531  NA 114* 119* 131*  K 4.3 4.0 4.1  CL 80* 81* 98  CO2 22 24 21*  GLUCOSE 89 88 91  BUN 19 18 11   CREATININE 1.18* 1.11* 0.88  CALCIUM  8.6* 8.8* 8.1*  MG  --   --  1.9  PHOS  --   --  2.9    GFR: Estimated Creatinine Clearance: 65.9 mL/min (by C-G formula based on SCr of 0.88 mg/dL).  Liver Function Tests: Recent Labs  Lab 02/12/24 1624 02/13/24 0531  AST 63* 63*  ALT 70* 59*  ALKPHOS 232* 192*  BILITOT <0.2 <0.2  PROT 7.7 6.2*  ALBUMIN 3.8 3.1*    Recent Results (from the past 240 hours)  Culture, blood (routine x 2)     Status: None (Preliminary  result)   Collection Time: 02/12/24  9:28 PM   Specimen: BLOOD  Result Value Ref Range Status   Specimen Description BLOOD BLOOD RIGHT ARM  Final   Special Requests   Final    BOTTLES DRAWN AEROBIC AND ANAEROBIC Blood Culture adequate volume   Culture   Final    NO GROWTH < 12 HOURS Performed at [facility], [address] [place], [place] [address]    Report Status PENDING  Incomplete  Culture, blood (routine x 2)     Status: None (Preliminary result)   Collection Time: 02/12/24  9:31 PM   Specimen: BLOOD  Result Value Ref Range Status   Specimen Description BLOOD BLOOD RIGHT HAND  Final   Special Requests   Final    BOTTLES DRAWN AEROBIC AND ANAEROBIC Blood Culture adequate volume   Culture   Final    NO GROWTH < 12 HOURS Performed at [facility], [address], [place], [place] [address]    Report Status PENDING  Incomplete  Resp panel by RT-PCR (RSV, Flu A&B, Covid) Anterior Nasal Swab     Status: None   Collection Time: 02/12/24 10:23 PM   Specimen: Anterior Nasal Swab  Result Value Ref Range Status   SARS Coronavirus 2 by RT PCR NEGATIVE NEGATIVE Final    Comment: (NOTE) SARS-CoV-2 target nucleic acids are NOT DETECTED.  The SARS-CoV-2 RNA is generally detectable in upper respiratory specimens during the acute phase of infection. The  lowest concentration of SARS-CoV-2 viral copies this assay can detect is 138 copies/mL. A negative result does not preclude SARS-Cov-2 infection and should not be used as the sole basis for treatment or other patient management decisions. A negative result may occur with  improper specimen collection/handling, submission of specimen other than nasopharyngeal swab, presence of viral mutation(s) within the areas targeted by this assay, and inadequate number of viral copies(<138 copies/mL). A negative result must be combined with clinical observations, patient history, and epidemiological information. The expected result is Negative.  Fact Sheet for Patients:  BloggerCourse.com  Fact Sheet for Healthcare Providers:  SeriousBroker.it  This test is no t yet approved or cleared by the [place]  FDA and  has been authorized for detection and/or diagnosis of SARS-CoV-2 by FDA under an Emergency Use Authorization (EUA). This EUA will remain  in effect (meaning this test can be used) for the duration of the COVID-19 declaration under Section 564(b)(1) of the Act, 21 U.S.C.section 360bbb-3(b)(1), unless the authorization is terminated  or revoked sooner.       Influenza A by PCR NEGATIVE NEGATIVE Final   Influenza B by PCR NEGATIVE NEGATIVE Final    Comment: (NOTE) The Xpert Xpress SARS-CoV-2/FLU/RSV plus assay is intended as an aid in the diagnosis of influenza from Nasopharyngeal swab specimens and should not be used as a sole basis for treatment. Nasal washings and aspirates are unacceptable for Xpert Xpress SARS-CoV-2/FLU/RSV testing.  Fact Sheet for Patients: BloggerCourse.com  Fact Sheet for Healthcare Providers: SeriousBroker.it  This test is not yet approved or cleared by the [place]  FDA and has been authorized for detection and/or diagnosis of SARS-CoV-2 by FDA under  an Emergency Use Authorization (EUA). This EUA will remain in effect (meaning this test can be used) for the duration of the COVID-19 declaration under Section 564(b)(1) of the Act, 21 U.S.C. section 360bbb-3(b)(1), unless the authorization is terminated or revoked.     Resp Syncytial Virus by PCR NEGATIVE NEGATIVE Final    Comment: (NOTE)  Fact Sheet for Patients: BloggerCourse.com  Fact Sheet for Healthcare Providers: SeriousBroker.it  This test is not yet approved or cleared by the [place]  FDA and has been authorized for detection and/or diagnosis of SARS-CoV-2 by FDA under an Emergency Use Authorization (EUA). This EUA will remain in effect (meaning this test can be used) for the duration of the COVID-19 declaration under Section 564(b)(1) of the Act, 21 U.S.C. section 360bbb-3(b)(1), unless the authorization is terminated or revoked.  Performed at [facility], [address], [place], [place] [address]       Radiology Studies: CT Head Wo Contrast Result Date: 02/12/2024 CLINICAL DATA:  Chest pain short of breath EXAM: CT HEAD WITHOUT CONTRAST TECHNIQUE: Contiguous axial images were obtained from the base of the skull through the vertex without intravenous contrast. RADIATION DOSE REDUCTION: This exam was performed according to the departmental dose-optimization program which includes automated exposure control, adjustment of the mA and/or kV according to patient size and/or use of iterative reconstruction technique. COMPARISON:  CT brain 12/28/2019 FINDINGS: Brain: No evidence of acute infarction, hemorrhage, hydrocephalus, extra-axial collection or mass lesion/mass effect. Vascular: No hyperdense vessel or unexpected calcification. Skull: Normal. Negative for fracture or focal lesion. Sinuses/Orbits: No acute finding. Other: None IMPRESSION: Negative non contrasted CT appearance of the brain. Electronically Signed   By: Luke Bun M.D.   On: 02/12/2024 20:09   CT Angio Chest PE W and/or Wo Contrast Result Date: 02/12/2024 CLINICAL DATA:  Elevated D-dimer right-sided chest pain short of breath EXAM: CT ANGIOGRAPHY CHEST WITH CONTRAST TECHNIQUE: Multidetector CT imaging of the chest was performed using the standard protocol during bolus administration of intravenous contrast. Multiplanar CT image reconstructions and MIPs were obtained to evaluate the vascular anatomy. RADIATION DOSE REDUCTION: This exam was performed according to the departmental dose-optimization program which includes automated exposure control, adjustment of the mA and/or kV according to patient size and/or use of iterative reconstruction technique. CONTRAST:  75mL OMNIPAQUE  IOHEXOL  350 MG/ML SOLN COMPARISON:  Chest x-ray 02/12/2024, chest CT 09/27/2023 FINDINGS: Cardiovascular: Satisfactory opacification of the pulmonary arteries to the segmental level. No evidence of pulmonary embolism. Mild atherosclerosis. No aneurysm. Mild coronary vascular calcification. Borderline to mild cardiomegaly. Trace pericardial effusion. Mediastinum/Nodes: Patent trachea. No suspicious thyroid  mass. Interval enlargement of mediastinal lymph nodes, right paratracheal node measures up to 11 mm. Right hilar node measures 14 mm. Esophagus within normal limits. Lungs/Pleura: No pleural effusion or pneumothorax. Patchy ground-glass disease bilaterally appears worse compared with the Sep 27, 2023 CT. No confluent consolidation. Upper Abdomen: No acute finding. Cyst upper pole left kidney for which no imaging follow-up is recommended Musculoskeletal: No acute osseous abnormality Review of the MIP images confirms the above findings. IMPRESSION: 1. Negative for acute pulmonary embolus. 2. Patchy ground-glass disease bilaterally appears worse compared with the Sep 27, 2023 CT. There may be underlying small airways disease and mosaicism but suspect that there is an acute superimposed  infectious or inflammatory process, less likely edema also present. 3. Interval enlargement of mediastinal and right hilar lymph nodes, likely reactive. 4. Aortic atherosclerosis. Aortic Atherosclerosis (ICD10-I70.0). Electronically Signed   By: Luke Bun M.D.   On: 02/12/2024 20:07   DG Chest 2 View Result Date: 02/12/2024 CLINICAL DATA:  Shortness of breath.  Chest pain. EXAM: CHEST - 2 VIEW COMPARISON:  June 22, 2023. FINDINGS: The heart size and mediastinal contours are within normal limits. Mild central pulmonary vascular congestion is noted with mild bilateral perihilar interstitial densities concerning for pulmonary  edema or atypical inflammation. The visualized skeletal structures are unremarkable. IMPRESSION: Mild central pulmonary vascular congestion is noted with mild bilateral perihilar interstitial densities concerning for pulmonary edema or atypical inflammation. Electronically Signed   By: Lynwood Landy Raddle M.D.   On: 02/12/2024 16:17       LOS: 1 day   [name]  [facility] Pager on [email or web address]  02/13/2024, 9:01 AM

## 2024-02-13 NOTE — Progress Notes (Addendum)
 Progress Note  Patient presents to the ED due to positive D-dimer noted on outpatient lab workup, workup done in the ED showed that patient was hyponatremic, IV hydration was provided, but patient had rapid correction of the sodium (114 > 119 > 131).  No further IV hydration was provided, patient will be started on IV D5W and desmopressin.  Continue to monitor BMP.  Goal is no more than 8 - 10 mEq/L per day.  Total time:  35 minutes This includes time reviewing the chart including progress notes, labs, EKGs, taking medical decisions, ordering labs and documenting findings.  Please refer to admission H&P for details regarding the care of this patient.

## 2024-02-13 NOTE — TOC Initial Note (Signed)
 Transition of Care [facility]) - Initial/Assessment Note    Patient Details  Name: [name] MRN: [id number] Date of Birth: [birth year]  Transition of Care [facility]) CM/SW Contact:    Mcarthur Saddie Kim, LCSW Phone Number: 02/13/2024, 8:48 AM  Clinical Narrative: Pt admitted for hyponatremia. Assessment completed due to high risk readmission score. Pt reports she lives with her husband and son. They assist with ADLs as needed. Pt is working on getting CAP aid. She is on 2L home O2 ([employer]). Pt has transportation to appointments. She plans to return home when medically stable. TOC will follow.                   Expected Discharge Plan: Home/Self Care Barriers to Discharge: Continued Medical Work up   Patient Goals and CMS Choice Patient states their goals for this hospitalization and ongoing recovery are:: return home   Choice offered to / list presented to : Patient [place] ownership interest in [facility].provided to::  (n/a)    Expected Discharge Plan and Services In-house Referral: Clinical Social Work     Living arrangements for the past 2 months: Single Family Home                                      Prior Living Arrangements/Services Living arrangements for the past 2 months: Single Family Home Lives with:: Spouse, Adult Children Patient language and need for interpreter reviewed:: Yes Do you feel safe going back to the place where you live?: Yes      Need for Family Participation in Patient Care: No (Comment) Care giver support system in place?: Yes (comment) Current home services: DME (O2, shower chair) Criminal Activity/Legal Involvement Pertinent to Current Situation/Hospitalization: No - Comment as needed  Activities of Daily Living      Permission Sought/Granted                  Emotional Assessment     Affect (typically observed): Appropriate Orientation: : Oriented to Self, Oriented to Place, Oriented to  Time, Oriented  to Situation   Psych Involvement: No (comment)  Admission diagnosis:  Hyponatremia [E87.1] 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   Opiate misuse 04/09/2023   Upper airway cough syndrome 04/05/2023   DOE (dyspnea on exertion) 04/05/2023   PTSD (post-traumatic stress disorder) 03/14/2023   Generalized anxiety disorder 03/14/2023   Agoraphobia 03/14/2023   Caffeine overuse 03/14/2023   Vitamin D  insufficiency 03/14/2023   Chronic respiratory failure with hypoxia (HCC) 02/22/2023   Cigarette smoker 02/21/2023   Nonischemic cardiomyopathy (HCC) 02/11/2023   MDD (major depressive disorder), recurrent, severe, with psychosis (HCC) 02/11/2023   Hyperlipidemia 02/11/2023   Complex renal cyst 02/11/2023   CKD (chronic kidney disease), stage II 02/11/2023   Current tobacco use 02/11/2023   Psychophysiologic insomnia 02/11/2023   COPD (chronic obstructive pulmonary disease) (HCC) 09/09/2022   Anemia 09/05/2022   Periumbilical abdominal pain 09/04/2022   UGIB (upper gastrointestinal bleed) 09/04/2022   CAP (community acquired pneumonia) 09/02/2022   Essential hypertension 09/02/2022   Chronic generalized abdominal pain 08/23/2022   Piriformis syndrome of both sides 09/05/2021   Lesion of sciatic nerve, bilateral  lower limbs 09/05/2021   Gastroesophageal reflux disease 04/11/2021   Abdominal pain, chronic, epigastric 04/11/2021   Hypokalemia    Nausea and vomiting 11/12/2020   RLQ abdominal pain    Sacroiliac joint pain 01/07/2020   SI joint arthritis 05/19/2018   Long term prescription opiate use 05/19/2018   Lumbar radiculopathy 02/04/2017   Lumbar degenerative disc disease 02/04/2017   Lumbar spondylosis 02/04/2017    Chronic low back pain with bilateral sciatica 02/04/2017   Chronic pain syndrome 02/04/2017   Lumbar sprain 02/04/2017   Chronic migraine without aura without status migrainosus, not intractable 02/04/2017   PCP:  Bevely Doffing, FNP Pharmacy:   [facility] - [place], [place] - [address] [place] [address] Phone: [phone] Fax: [phone]     Social Drivers of Health (SDOH) Social History: SDOH Screenings   Food Insecurity: Food Insecurity Present (12/04/2023)  Housing: High Risk (12/04/2023)  Transportation Needs: Unmet Transportation Needs (12/04/2023)  Depression (PHQ2-9): High Risk (03/14/2023)  Financial Resource Strain: High Risk (12/04/2023)  Physical Activity: Inactive (12/04/2023)  Social Connections: Socially Isolated (12/04/2023)  Stress: Stress Concern Present (12/04/2023)  Tobacco Use: High Risk (02/12/2024)   SDOH Interventions:     Readmission Risk Interventions    02/13/2024    8:47 AM  Readmission Risk Prevention Plan  Transportation Screening Complete  HRI or Home Care Consult Complete  Social Work Consult for Recovery Care Planning/Counseling Complete  Palliative Care Screening Not Applicable  Medication Review Oceanographer) Complete

## 2024-02-13 NOTE — ED Notes (Signed)
 Attempted to give pt her 1000 meds. Pt stated when I entered the room that she is sick and tired of sitting in the hospital and has all of the meds and oxygen  at home and she can sit and be sick at home. Pt states that she isn't going to stay and that she's leaving with her husband when he gets here and she was going to wait in waiting room. MD notified of pt's desire to leave. AMA form signed and pt walked to waiting room.

## 2024-02-13 NOTE — Discharge Summary (Signed)
 [facility]  Physician Discharge Summary   Patient ID: [name] MRN: [id number] DOB/AGE: 20-Jul-1970 53 y.o.  Admit date: 02/12/2024 Discharge date: 02/13/2024    PCP: Bevely Doffing, FNP  DISCHARGE DIAGNOSES:  Principal Problem:   Hyponatremia Active Problems:   Gastroesophageal reflux disease   CAP (community acquired pneumonia)   Essential hypertension   COPD (chronic obstructive pulmonary disease) (HCC)   Chronic respiratory failure with hypoxia (HCC)   Acquired hypothyroidism   Heart failure with recovered ejection fraction (HFrecEF) (HCC)   PATIENT LEFT AGAINST MEDICAL ADVISE  INITIAL HISTORY:  53 y.o. female with medical history significant of hypertension, hyperlipidemia, COPD on 2 LPM of oxygen  at home, history of HFrecEF, nonischemic cardiomyopathy, history of PE (not on blood thinners) who presents to the emergency department due to elevated D-dimer, chest pain and shortness of breath. Patient went to pulmonologist on 9/23, blood work was done and patient saw abnormal result in MyChart , but she was unable to communicate with anyone from the pulmonologist office, she had a follow-up with cardiologist today, positive D-dimer was noted on the recent blood work done and patient was asked to go to the ED for evaluation.  She underwent CT angiogram of the chest.  PE was ruled out but she was found to have patchy opacities.  She was hospitalized for further management.   HOSPITAL COURSE:   Hyponatremia Was noted to have a sodium level of 114.  Sodium was 139 in February.  No labs noted in Care Everywhere. Patient mentions that due to lower extremity edema the dose of her diuretics were increased recently.  Hyponatremia is most likely acute.  Most likely due to diuretic use. Patient was given normal saline boluses.  Looks like she was given 3 L of normal saline along with 500 mL bolus of LR.  Sodium level increased from 1 14-1 19-1 31 this morning.   Overcorrection of sodium noted.  Patient has been given a dose of desmopressin.  She was been started on D5 infusion.  Will recheck sodium levels later today and tonight and then tomorrow again.   Urine osmolality and urine sodium level is pending.   Community-acquired pneumonia COVID-19 influenza and RSV PCR negative. Patient came in with chest pain.  PE was ruled out.  No concern for pulmonary edema but concern for pneumonia was raised.  Could also be inflammatory in origin.  Since her WBC is normal and procalcitonin is unremarkable we will check inflammatory markers. Procalcitonin level is less than 0.1.  Continue with ceftriaxone  for now.  Doxycycline was also ordered but she refused due to allergy.  Allergic to macrolides as well. Does not seem to have any oxygen  requirements.  She mentioned that she does use oxygen  mainly at nighttime at home. Follow-up with pulmonology at discharge   Chronic respiratory failure with hypoxia On nocturnal oxygen  at home.   Normocytic anemia Drop in hemoglobin is likely dilutional.  No evidence of overt bleeding.  Continue to monitor.   Acquired hypothyroidism Continue levothyroxine .   Essential hypertension Low blood pressures noted.  Antihypertensives on hold.   Chronic systolic CHF EF was 25 to 30% in 2024.  Within a month of that EF had recovered. Followed by cardiology.  Diuretics on hold due to hyponatremia.  Other cardiac medications on hold due to hypotension.  No active cardiac issues currently.   Abnormal LFTs Elevated alkaline phosphatase, AST and ALT noted.  Old labs reviewed and she has always had some degree of  abnormal LFTs previously.  Will check hepatitis panel.  Right upper quadrant ultrasound.  No recent CT scan or ultrasound reports available.    PATIENT LEFT AGAINST MEDICAL ADVISE  PERTINENT LABS:  The results of significant diagnostics from this hospitalization (including imaging, microbiology, ancillary and laboratory) are  listed below for reference.    Microbiology: Recent Results (from the past 240 hours)  Culture, blood (routine x 2)     Status: None (Preliminary result)   Collection Time: 02/12/24  9:28 PM   Specimen: BLOOD  Result Value Ref Range Status   Specimen Description BLOOD BLOOD RIGHT ARM  Final   Special Requests   Final    BOTTLES DRAWN AEROBIC AND ANAEROBIC Blood Culture adequate volume   Culture   Final    NO GROWTH < 12 HOURS Performed at [facility], [address], [place], [place] [address]    Report Status PENDING  Incomplete  Culture, blood (routine x 2)     Status: None (Preliminary result)   Collection Time: 02/12/24  9:31 PM   Specimen: BLOOD  Result Value Ref Range Status   Specimen Description BLOOD BLOOD RIGHT HAND  Final   Special Requests   Final    BOTTLES DRAWN AEROBIC AND ANAEROBIC Blood Culture adequate volume   Culture   Final    NO GROWTH < 12 HOURS Performed at [facility], [address], [place], [place] [address]    Report Status PENDING  Incomplete  Resp panel by RT-PCR (RSV, Flu A&B, Covid) Anterior Nasal Swab     Status: None   Collection Time: 02/12/24 10:23 PM   Specimen: Anterior Nasal Swab  Result Value Ref Range Status   SARS Coronavirus 2 by RT PCR NEGATIVE NEGATIVE Final    Comment: (NOTE) SARS-CoV-2 target nucleic acids are NOT DETECTED.  The SARS-CoV-2 RNA is generally detectable in upper respiratory specimens during the acute phase of infection. The lowest concentration of SARS-CoV-2 viral copies this assay can detect is 138 copies/mL. A negative result does not preclude SARS-Cov-2 infection and should not be used as the sole basis for treatment or other patient management decisions. A negative result may occur with  improper specimen collection/handling, submission of specimen other than nasopharyngeal swab, presence of viral mutation(s) within the areas targeted by this assay, and inadequate number of viral copies(<138 copies/mL). A  negative result must be combined with clinical observations, patient history, and epidemiological information. The expected result is Negative.  Fact Sheet for Patients:  BloggerCourse.com  Fact Sheet for Healthcare Providers:  SeriousBroker.it  This test is no t yet approved or cleared by the [place]  FDA and  has been authorized for detection and/or diagnosis of SARS-CoV-2 by FDA under an Emergency Use Authorization (EUA). This EUA will remain  in effect (meaning this test can be used) for the duration of the COVID-19 declaration under Section 564(b)(1) of the Act, 21 U.S.C.section 360bbb-3(b)(1), unless the authorization is terminated  or revoked sooner.       Influenza A by PCR NEGATIVE NEGATIVE Final   Influenza B by PCR NEGATIVE NEGATIVE Final    Comment: (NOTE) The Xpert Xpress SARS-CoV-2/FLU/RSV plus assay is intended as an aid in the diagnosis of influenza from Nasopharyngeal swab specimens and should not be used as a sole basis for treatment. Nasal washings and aspirates are unacceptable for Xpert Xpress SARS-CoV-2/FLU/RSV testing.  Fact Sheet for Patients: BloggerCourse.com  Fact Sheet for Healthcare Providers: SeriousBroker.it  This test is not yet approved or  cleared by the [place]  FDA and has been authorized for detection and/or diagnosis of SARS-CoV-2 by FDA under an Emergency Use Authorization (EUA). This EUA will remain in effect (meaning this test can be used) for the duration of the COVID-19 declaration under Section 564(b)(1) of the Act, 21 U.S.C. section 360bbb-3(b)(1), unless the authorization is terminated or revoked.     Resp Syncytial Virus by PCR NEGATIVE NEGATIVE Final    Comment: (NOTE) Fact Sheet for Patients: BloggerCourse.com  Fact Sheet for Healthcare  Providers: SeriousBroker.it  This test is not yet approved or cleared by the [place]  FDA and has been authorized for detection and/or diagnosis of SARS-CoV-2 by FDA under an Emergency Use Authorization (EUA). This EUA will remain in effect (meaning this test can be used) for the duration of the COVID-19 declaration under Section 564(b)(1) of the Act, 21 U.S.C. section 360bbb-3(b)(1), unless the authorization is terminated or revoked.  Performed at [facility], [address], [place], [place] [address]      Labs:   Basic Metabolic Panel: Recent Labs  Lab 02/12/24 1624 02/12/24 1852 02/13/24 0531  NA 114* 119* 131*  K 4.3 4.0 4.1  CL 80* 81* 98  CO2 22 24 21*  GLUCOSE 89 88 91  BUN 19 18 11   CREATININE 1.18* 1.11* 0.88  CALCIUM  8.6* 8.8* 8.1*  MG  --   --  1.9  PHOS  --   --  2.9   Liver Function Tests: Recent Labs  Lab 02/12/24 1624 02/13/24 0531  AST 63* 63*  ALT 70* 59*  ALKPHOS 232* 192*  BILITOT <0.2 <0.2  PROT 7.7 6.2*  ALBUMIN 3.8 3.1*    CBC: Recent Labs  Lab 02/12/24 1552 02/13/24 0531  WBC 11.3* 6.6  NEUTROABS 8.5*  --   HGB 10.4* 8.4*  HCT 31.4* 26.2*  MCV 83.7 85.6  PLT 383 338     IMAGING STUDIES US  Abdomen Limited RUQ (LIVER/GB) Result Date: 02/13/2024 CLINICAL DATA:  [id number] Abnormal LFTs [id number] EXAM: ULTRASOUND ABDOMEN LIMITED RIGHT UPPER QUADRANT COMPARISON:  02/01/2023 FINDINGS: Gallbladder: Cholecystectomy. Common bile duct: Diameter: 3 mm Liver: Normal echogenicity. No focal lesion identified. No intrahepatic biliary ductal dilation. Portal vein is patent on color Doppler imaging with normal direction of blood flow towards the liver. Right Kidney: Partially visualized. No mass. No hydronephrosis or nephrolithiasis. Other: None. IMPRESSION: Cholecystectomy. No intrahepatic or extrahepatic biliary ductal dilation. Electronically Signed   By: Rogelia Myers M.D.   On: 02/13/2024 10:40   CT Head Wo  Contrast Result Date: 02/12/2024 CLINICAL DATA:  Chest pain short of breath EXAM: CT HEAD WITHOUT CONTRAST TECHNIQUE: Contiguous axial images were obtained from the base of the skull through the vertex without intravenous contrast. RADIATION DOSE REDUCTION: This exam was performed according to the departmental dose-optimization program which includes automated exposure control, adjustment of the mA and/or kV according to patient size and/or use of iterative reconstruction technique. COMPARISON:  CT brain 12/28/2019 FINDINGS: Brain: No evidence of acute infarction, hemorrhage, hydrocephalus, extra-axial collection or mass lesion/mass effect. Vascular: No hyperdense vessel or unexpected calcification. Skull: Normal. Negative for fracture or focal lesion. Sinuses/Orbits: No acute finding. Other: None IMPRESSION: Negative non contrasted CT appearance of the brain. Electronically Signed   By: Luke Bun M.D.   On: 02/12/2024 20:09   CT Angio Chest PE W and/or Wo Contrast Result Date: 02/12/2024 CLINICAL DATA:  Elevated D-dimer right-sided chest pain short of breath EXAM: CT ANGIOGRAPHY CHEST WITH CONTRAST TECHNIQUE: Multidetector CT  imaging of the chest was performed using the standard protocol during bolus administration of intravenous contrast. Multiplanar CT image reconstructions and MIPs were obtained to evaluate the vascular anatomy. RADIATION DOSE REDUCTION: This exam was performed according to the departmental dose-optimization program which includes automated exposure control, adjustment of the mA and/or kV according to patient size and/or use of iterative reconstruction technique. CONTRAST:  75mL OMNIPAQUE  IOHEXOL  350 MG/ML SOLN COMPARISON:  Chest x-ray 02/12/2024, chest CT 09/27/2023 FINDINGS: Cardiovascular: Satisfactory opacification of the pulmonary arteries to the segmental level. No evidence of pulmonary embolism. Mild atherosclerosis. No aneurysm. Mild coronary vascular calcification. Borderline to  mild cardiomegaly. Trace pericardial effusion. Mediastinum/Nodes: Patent trachea. No suspicious thyroid  mass. Interval enlargement of mediastinal lymph nodes, right paratracheal node measures up to 11 mm. Right hilar node measures 14 mm. Esophagus within normal limits. Lungs/Pleura: No pleural effusion or pneumothorax. Patchy ground-glass disease bilaterally appears worse compared with the Sep 27, 2023 CT. No confluent consolidation. Upper Abdomen: No acute finding. Cyst upper pole left kidney for which no imaging follow-up is recommended Musculoskeletal: No acute osseous abnormality Review of the MIP images confirms the above findings. IMPRESSION: 1. Negative for acute pulmonary embolus. 2. Patchy ground-glass disease bilaterally appears worse compared with the Sep 27, 2023 CT. There may be underlying small airways disease and mosaicism but suspect that there is an acute superimposed infectious or inflammatory process, less likely edema also present. 3. Interval enlargement of mediastinal and right hilar lymph nodes, likely reactive. 4. Aortic atherosclerosis. Aortic Atherosclerosis (ICD10-I70.0). Electronically Signed   By: Luke Bun M.D.   On: 02/12/2024 20:07   DG Chest 2 View Result Date: 02/12/2024 CLINICAL DATA:  Shortness of breath.  Chest pain. EXAM: CHEST - 2 VIEW COMPARISON:  June 22, 2023. FINDINGS: The heart size and mediastinal contours are within normal limits. Mild central pulmonary vascular congestion is noted with mild bilateral perihilar interstitial densities concerning for pulmonary edema or atypical inflammation. The visualized skeletal structures are unremarkable. IMPRESSION: Mild central pulmonary vascular congestion is noted with mild bilateral perihilar interstitial densities concerning for pulmonary edema or atypical inflammation. Electronically Signed   By: Lynwood Landy Raddle M.D.   On: 02/12/2024 16:17   US  THYROID  Result Date: 02/10/2024 CLINICAL DATA:  Hypothyroidism. EXAM:  THYROID  ULTRASOUND TECHNIQUE: Ultrasound examination of the thyroid  gland and adjacent soft tissues was performed. COMPARISON:  None available. FINDINGS: Parenchymal Echotexture: Moderately heterogenous Isthmus: 0.4 cm Right lobe: 3.9 x 1.7 x 1.2 cm Left lobe: 4.0 x 1.5 x 1.3 cm _________________________________________________________ Estimated total number of nodules >/= 1 cm: 0 Number of spongiform nodules >/=  2 cm not described below (TR1): 0 Number of mixed cystic and solid nodules >/= 1.5 cm not described below (TR2): 0 _________________________________________________________ No discrete nodules are seen within the thyroid  gland. IMPRESSION: Moderate diffuse heterogeneity of the thyroid  parenchyma without discrete nodule. The above is in keeping with the ACR TI-RADS recommendations - J Am Coll Radiol 2017;14:587-595. Electronically Signed   By: Aliene Lloyd M.D.   On: 02/10/2024 11:43   PATIENT LEFT AGAINST MEDICAL ADVISE    Joette Pebbles  [facility] Pager on [email or web address]  02/13/2024, 12:04 PM

## 2024-02-13 NOTE — ED Notes (Signed)
 Hospitalist O Adefeso DO, made aware of low bp reading at 0345

## 2024-02-13 NOTE — ED Notes (Signed)
 MD MALVA Maier made aware of blood pressures with map lower than 65

## 2024-02-13 NOTE — ED Notes (Signed)
Pt is eating breakfast at this time

## 2024-02-15 LAB — LEGIONELLA PNEUMOPHILA SEROGP 1 UR AG: L. pneumophila Serogp 1 Ur Ag: NEGATIVE

## 2024-02-17 ENCOUNTER — Ambulatory Visit

## 2024-02-17 LAB — CULTURE, BLOOD (ROUTINE X 2)
Culture: NO GROWTH
Culture: NO GROWTH
Special Requests: ADEQUATE
Special Requests: ADEQUATE

## 2024-02-18 ENCOUNTER — Ambulatory Visit: Admitting: Family Medicine

## 2024-02-18 ENCOUNTER — Ambulatory Visit (INDEPENDENT_AMBULATORY_CARE_PROVIDER_SITE_OTHER)

## 2024-02-18 VITALS — BP 78/47 | HR 73 | Ht 61.0 in | Wt 156.0 lb

## 2024-02-18 DIAGNOSIS — J189 Pneumonia, unspecified organism: Secondary | ICD-10-CM

## 2024-02-18 DIAGNOSIS — Z720 Tobacco use: Secondary | ICD-10-CM

## 2024-02-18 DIAGNOSIS — E871 Hypo-osmolality and hyponatremia: Secondary | ICD-10-CM | POA: Diagnosis not present

## 2024-02-18 MED ORDER — NICOTINE 14 MG/24HR TD PT24: 14.0000 mg | MEDICATED_PATCH | Freq: Every day | TRANSDERMAL | 2 refills | Status: DC

## 2024-02-18 MED ORDER — AZITHROMYCIN 250 MG PO TABS: ORAL_TABLET | ORAL | 0 refills | Status: AC

## 2024-02-18 NOTE — Progress Notes (Signed)
 Established Patient Office Visit  Subjective   Patient ID: [name], female    DOB: 1970-09-23  Age: 53 y.o. MRN: [id number]  Chief Complaint  Patient presents with   Hospitalization Follow-up    ED follow up    HPI   Pt is a 53 yo female with pmhx significant for htn, migraines, chronic pain, uterine cancer, GERD, CHF, arthritis, chf, hx PE (not on thinners).  Pt has been feeling tired and has been having some cp.  She has had 2 episodes of syncope where she wakes up on the ground and has hit her head.  She did see pulmonology on 9/23 and had blood work done.  She did have a + ddimer.  She called the office as she saw the abn result in My Chart, but there was no one there to speak to her, so she did not know what it meant.  She had a cardiology apt today who noted the + ddimer and told her to come to the ED for eval of PE.  Problem List / ED Course:   Hyponatremia:  new onset.  Unclear etiology.  Possibly lung disease?  Pt is also on lasix . Pt does not drink alcohol.  Pt does not appear fluid overloaded.  She is given ivfs.  CAP:  pt started on rocephin .  She refused doxy saying she's allergic.  Pt allergic to zithromax  and levaquin  and pcns as well.  She is saturating well.       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   Opiate misuse 04/09/2023   Upper airway cough syndrome 04/05/2023   DOE (dyspnea on exertion) 04/05/2023   PTSD (post-traumatic stress disorder) 03/14/2023   Generalized anxiety disorder 03/14/2023   Agoraphobia 03/14/2023   Caffeine overuse 03/14/2023   Vitamin D  insufficiency 03/14/2023   Chronic respiratory failure with hypoxia (HCC) 02/22/2023   Cigarette smoker 02/21/2023   Nonischemic  cardiomyopathy (HCC) 02/11/2023   MDD (major depressive disorder), recurrent, severe, with psychosis (HCC) 02/11/2023   Hyperlipidemia 02/11/2023   Complex renal cyst 02/11/2023   CKD (chronic kidney disease), stage II 02/11/2023   Current tobacco use 02/11/2023   Psychophysiologic insomnia 02/11/2023   COPD (chronic obstructive pulmonary disease) (HCC) 09/09/2022   Anemia 09/05/2022   Periumbilical abdominal pain 09/04/2022   UGIB (upper gastrointestinal bleed) 09/04/2022   CAP (community acquired pneumonia) 09/02/2022   Essential hypertension 09/02/2022   Chronic generalized abdominal pain 08/23/2022   Piriformis syndrome of both sides 09/05/2021   Lesion of sciatic nerve, bilateral lower limbs 09/05/2021   Gastroesophageal reflux disease 04/11/2021   Abdominal pain, chronic, epigastric 04/11/2021   Hypokalemia    Nausea and vomiting 11/12/2020   RLQ abdominal pain    Sacroiliac joint pain 01/07/2020   SI joint arthritis 05/19/2018   Long term prescription opiate use 05/19/2018   Lumbar radiculopathy 02/04/2017   Lumbar degenerative disc disease 02/04/2017   Lumbar spondylosis 02/04/2017   Chronic low back pain with bilateral sciatica 02/04/2017   Chronic pain syndrome 02/04/2017   Lumbar sprain 02/04/2017   Chronic migraine without aura without status migrainosus, not intractable 02/04/2017   ROS    Objective:     BP (!) 78/47   Pulse 73   Ht 5' 1 (  1.549 m)   Wt 156 lb (70.8 kg)   SpO2 91%   BMI 29.48 kg/m  BP Readings from Last 3 Encounters:  02/18/24 (!) 78/47  02/13/24 (!) 85/53  02/12/24 130/80   Wt Readings from Last 3 Encounters:  02/18/24 156 lb (70.8 kg)  02/12/24 156 lb 12.8 oz (71.1 kg)  02/12/24 156 lb 12.8 oz (71.1 kg)     Physical Exam Vitals and nursing note reviewed.  Constitutional:      Appearance: Normal appearance.  HENT:     Head: Normocephalic.     Right Ear: Tympanic membrane, ear canal and external ear normal.     Left Ear:  Tympanic membrane, ear canal and external ear normal.     Nose: Nose normal.     Mouth/Throat:     Mouth: Mucous membranes are moist.     Pharynx: Oropharynx is clear.  Eyes:     Extraocular Movements: Extraocular movements intact.     Pupils: Pupils are equal, round, and reactive to light.  Cardiovascular:     Rate and Rhythm: Normal rate and regular rhythm.  Pulmonary:     Effort: Pulmonary effort is normal.     Breath sounds: Normal breath sounds.  Musculoskeletal:     Cervical back: Normal range of motion and neck supple.  Skin:    General: Skin is warm and dry.  Neurological:     Mental Status: She is alert and oriented to person, place, and time.  Psychiatric:        Mood and Affect: Mood normal.        Thought Content: Thought content normal.    Last CBC Lab Results  Component Value Date   WBC 9.4 02/18/2024   HGB 8.2 (L) 02/18/2024   HCT 26.9 (L) 02/18/2024   MCV 87 02/18/2024   MCH 26.5 (L) 02/18/2024   RDW 16.6 (H) 02/18/2024   PLT 466 (H) 02/18/2024   Last metabolic panel Lab Results  Component Value Date   GLUCOSE 87 02/18/2024   NA 131 (L) 02/18/2024   K 4.3 02/18/2024   CL 92 (L) 02/18/2024   CO2 21 02/18/2024   BUN 10 02/18/2024   CREATININE 1.00 02/18/2024   GFRNONAA >60 02/13/2024   CALCIUM  8.5 (L) 02/18/2024   PHOS 2.9 02/13/2024   PROT 6.2 (L) 02/13/2024   ALBUMIN 3.1 (L) 02/13/2024   LABGLOB 3.0 06/14/2023   BILITOT <0.2 02/13/2024   ALKPHOS 192 (H) 02/13/2024   AST 63 (H) 02/13/2024   ALT 59 (H) 02/13/2024   ANIONGAP 12 02/13/2024   Last lipids Lab Results  Component Value Date   CHOL 140 02/11/2023   HDL 31 (L) 02/11/2023   LDLCALC 73 02/11/2023   TRIG 214 (H) 02/11/2023   CHOLHDL 4.5 (H) 02/11/2023   Last hemoglobin A1c Lab Results  Component Value Date   HGBA1C 5.9 (H) 02/11/2023   Last thyroid  functions Lab Results  Component Value Date   TSH 3.710 01/28/2024   THYROIDAB 152 (H) 01/28/2024   Last vitamin D  Lab  Results  Component Value Date   VD25OH 25.6 (L) 06/14/2023   Last vitamin B12 and Folate Lab Results  Component Value Date   VITAMINB12 681 02/11/2023   FOLATE 6.3 02/11/2023      The ASCVD Risk score (Arnett DK, et al., 2019) failed to calculate for the following reasons:   The valid systolic blood pressure range is 90 to 200 mmHg    Assessment & Plan:  Problem List Items Addressed This Visit       Respiratory   CAP (community acquired pneumonia) - Primary   Reports improvement of symptoms associated with CPAP.  She completed course of oral antibiotics.  She is not receptive to smoking cessation at this time.  We will reevaluate at future office visit.  Recommend f/u if symptoms of cough, fever or SOB develop.       Relevant Medications   azithromycin  (ZITHROMAX ) 250 MG tablet   Other Relevant Orders   CBC with Differential/Platelet (Completed)     Other   Current tobacco use   Smoking cessation instruction/counseling given:  counseled patient on the dangers of tobacco use, advised patient to stop smoking, and reviewed strategies to maximize success Nicoderm patches refilled to help with smoking cessation.  Three minutes spent on this.         Relevant Medications   nicotine  (NICOTINE  STEP 2) 14 mg/24hr patch   Hyponatremia   Recheck BMP.  Recommend adding eletrolyte drink to diet daily.  Follow-up according to lab results.        Relevant Orders   Basic Metabolic Panel (BMET) (Completed)    Return in about 4 weeks (around 03/17/2024) for for recheck of symptoms.    Leita Longs, FNP

## 2024-02-19 LAB — CBC WITH DIFFERENTIAL/PLATELET
Basophils Absolute: 0.1 x10E3/uL (ref 0.0–0.2)
Basos: 1 %
EOS (ABSOLUTE): 0.4 x10E3/uL (ref 0.0–0.4)
Eos: 4 %
Hematocrit: 26.9 % — ABNORMAL LOW (ref 34.0–46.6)
Hemoglobin: 8.2 g/dL — ABNORMAL LOW (ref 11.1–15.9)
Immature Grans (Abs): 0.1 x10E3/uL (ref 0.0–0.1)
Immature Granulocytes: 1 %
Lymphocytes Absolute: 1.5 x10E3/uL (ref 0.7–3.1)
Lymphs: 16 %
MCH: 26.5 pg — ABNORMAL LOW (ref 26.6–33.0)
MCHC: 30.5 g/dL — ABNORMAL LOW (ref 31.5–35.7)
MCV: 87 fL (ref 79–97)
Monocytes Absolute: 0.8 x10E3/uL (ref 0.1–0.9)
Monocytes: 9 %
Neutrophils Absolute: 6.5 x10E3/uL (ref 1.4–7.0)
Neutrophils: 69 %
Platelets: 466 x10E3/uL — ABNORMAL HIGH (ref 150–450)
RBC: 3.1 x10E6/uL — ABNORMAL LOW (ref 3.77–5.28)
RDW: 16.6 % — ABNORMAL HIGH (ref 11.7–15.4)
WBC: 9.4 x10E3/uL (ref 3.4–10.8)

## 2024-02-19 LAB — BASIC METABOLIC PANEL WITH GFR
BUN/Creatinine Ratio: 10 (ref 9–23)
BUN: 10 mg/dL (ref 6–24)
CO2: 21 mmol/L (ref 20–29)
Calcium: 8.5 mg/dL — ABNORMAL LOW (ref 8.7–10.2)
Chloride: 92 mmol/L — ABNORMAL LOW (ref 96–106)
Creatinine, Ser: 1 mg/dL (ref 0.57–1.00)
Glucose: 87 mg/dL (ref 70–99)
Potassium: 4.3 mmol/L (ref 3.5–5.2)
Sodium: 131 mmol/L — ABNORMAL LOW (ref 134–144)
eGFR: 67 mL/min/1.73 (ref >59)

## 2024-02-20 ENCOUNTER — Encounter: Payer: Self-pay | Admitting: *Deleted

## 2024-02-20 ENCOUNTER — Telehealth: Payer: Self-pay | Admitting: *Deleted

## 2024-02-20 ENCOUNTER — Other Ambulatory Visit: Payer: Self-pay

## 2024-02-20 DIAGNOSIS — R112 Nausea with vomiting, unspecified: Secondary | ICD-10-CM

## 2024-02-20 MED ORDER — PROMETHAZINE HCL 25 MG PO TABS: 25.0000 mg | ORAL_TABLET | Freq: Three times a day (TID) | ORAL | 3 refills | Status: DC | PRN

## 2024-02-20 MED ORDER — ONDANSETRON 8 MG PO TBDP: 8.0000 mg | ORAL_TABLET | Freq: Three times a day (TID) | ORAL | 3 refills | Status: DC | PRN

## 2024-02-20 NOTE — Addendum Note (Signed)
 Addended by: EZZARD SONNY RAMAN on: 02/20/2024 02:14 PM   Modules accepted: Orders

## 2024-02-20 NOTE — Telephone Encounter (Signed)
 [name] please let PCP know that we are ordering the refills. Patient reached out to both practices.

## 2024-02-20 NOTE — Telephone Encounter (Signed)
 Pt called and needs a refill on Zofran  and Phenergan . Pt last OV 08/29/2023

## 2024-02-20 NOTE — Telephone Encounter (Signed)
 Noted. Informed PCP office.

## 2024-02-20 NOTE — Telephone Encounter (Signed)
 Copied from CRM [id number]. Topic: Clinical - Medication Refill >> Feb 20, 2024 11:43 AM Roselie BROCKS wrote: Medication: promethazine  (PHENERGAN ) 25 MG tablet ondansetron  (ZOFRAN  ODT) 4 MG disintegrating tablet   Has the patient contacted their pharmacy? Yes (Agent: If no, request that the patient contact the pharmacy for the refill. If patient does not wish to contact the pharmacy document the reason why and proceed with request.) (Agent: If yes, when and what did the pharmacy advise?)  This is the patient's preferred pharmacy:  [facility] - [place], [place] - [address] [place] [address] Phone: [phone] Fax: [phone]  Is this the correct pharmacy for this prescription? Yes If no, delete pharmacy and type the correct one.   Has the prescription been filled recently? No  Is the patient out of the medication? Yes  Has the patient been seen for an appointment in the last year OR does the patient have an upcoming appointment? Yes  Can we respond through MyChart? Yes  Agent: Please be advised that Rx refills may take up to 3 business days. We ask that you follow-up with your pharmacy.

## 2024-02-23 ENCOUNTER — Ambulatory Visit: Payer: Self-pay

## 2024-02-23 NOTE — Assessment & Plan Note (Signed)
 Reports improvement of symptoms associated with CPAP.  She completed course of oral antibiotics.  She is not receptive to smoking cessation at this time.  We will reevaluate at future office visit.  Recommend f/u if symptoms of cough, fever or SOB develop.

## 2024-02-23 NOTE — Assessment & Plan Note (Signed)
 Recheck BMP.  Recommend adding eletrolyte drink to diet daily.  Follow-up according to lab results.

## 2024-02-23 NOTE — Assessment & Plan Note (Addendum)
 Smoking cessation instruction/counseling given:  counseled patient on the dangers of tobacco use, advised patient to stop smoking, and reviewed strategies to maximize success Nicoderm patches refilled to help with smoking cessation.  Three minutes spent on this.

## 2024-02-24 ENCOUNTER — Ambulatory Visit: Payer: Self-pay

## 2024-02-24 ENCOUNTER — Other Ambulatory Visit: Payer: Self-pay

## 2024-02-24 MED ORDER — FLUCONAZOLE 150 MG PO TABS: 150.0000 mg | ORAL_TABLET | Freq: Once | ORAL | 0 refills | Status: DC

## 2024-02-24 MED ORDER — FLUCONAZOLE 150 MG PO TABS: 150.0000 mg | ORAL_TABLET | Freq: Once | ORAL | 2 refills | Status: AC

## 2024-02-24 NOTE — Telephone Encounter (Signed)
 FYI Only or Action Required?: Action required by provider: requesting rx.  Patient was last seen in primary care on 02/18/2024 by Bevely Doffing, FNP.  Called Nurse Triage reporting Thrush.  Symptoms began several days ago.  Interventions attempted: Rest, hydration, or home remedies.  Symptoms are: unchanged.  Triage Disposition: See PCP When Office is Open (Within 3 Days)  Patient/caregiver understands and will follow disposition?: No, wishes to speak with PCP            Copied from CRM #[id number]. Topic: Clinical - Red Word Triage >> Feb 24, 2024 11:27 AM Hamdi H wrote: [facility] that prompted transfer to Nurse Triage: Bad yeast infection, in mouth and throat as well after finishing the antibiotic. Reason for Disposition  [1] White patches that stick to tongue or inner cheek AND [2] can be wiped off    Pt requesting diflucan  be called in d/t yeast infection after finishing abx. Pt reports has developed yeast infection before and sx are similar.   Triager will forward encounter for Doffing, NP 's office to review and advise. Patient verbalized understanding and is expecting call back from office for next steps. Triager also advised that if pt does not hear back from office, to follow disposition for further evaluation/treatment.  Answer Assessment - Initial Assessment Questions 1. SYMPTOM: What's the main symptom you're concerned about? (e.g., chapped lips, dry mouth, lump, sores)     Completely covered - yeast infection in mouth and throat. 2. ONSET: When did the  sx  start?     Saturday 3. PAIN: Is there any pain? If Yes, ask: How bad is it? (Scale: 0-10; or none, mild, moderate, severe)     Uncomfortable but is still able to tolerate fluids 4. CAUSE: What do you think is causing the symptoms?     Yeast infection s/p abx 5. OTHER SYMPTOMS: Do you have any other symptoms? (e.g., fever, sore throat, toothache, swelling)     denies 6. PREGNANCY: Is there any  chance you are pregnant? When was your last menstrual period?     N/a  Protocols used: Mouth Symptoms-A-AH

## 2024-02-24 NOTE — Telephone Encounter (Signed)
Diflucan sent to her pharmacy

## 2024-02-25 ENCOUNTER — Ambulatory Visit: Payer: Self-pay | Admitting: Nurse Practitioner

## 2024-02-25 NOTE — Telephone Encounter (Signed)
 Advised

## 2024-03-06 ENCOUNTER — Ambulatory Visit: Admitting: Nurse Practitioner

## 2024-03-11 NOTE — Telephone Encounter (Signed)
 Noted was filled by Sonny Kerns

## 2024-03-17 ENCOUNTER — Encounter: Payer: Self-pay | Admitting: Internal Medicine

## 2024-03-17 ENCOUNTER — Ambulatory Visit (INDEPENDENT_AMBULATORY_CARE_PROVIDER_SITE_OTHER): Admitting: Internal Medicine

## 2024-03-17 VITALS — BP 113/54 | HR 72 | Ht 61.0 in | Wt 148.0 lb

## 2024-03-17 DIAGNOSIS — R0902 Hypoxemia: Secondary | ICD-10-CM

## 2024-03-17 DIAGNOSIS — G4734 Idiopathic sleep related nonobstructive alveolar hypoventilation: Secondary | ICD-10-CM | POA: Insufficient documentation

## 2024-03-17 DIAGNOSIS — J449 Chronic obstructive pulmonary disease, unspecified: Secondary | ICD-10-CM

## 2024-03-17 DIAGNOSIS — R058 Other specified cough: Secondary | ICD-10-CM | POA: Diagnosis not present

## 2024-03-17 DIAGNOSIS — J4489 Other specified chronic obstructive pulmonary disease: Secondary | ICD-10-CM | POA: Diagnosis not present

## 2024-03-17 DIAGNOSIS — R0609 Other forms of dyspnea: Secondary | ICD-10-CM | POA: Diagnosis not present

## 2024-03-17 DIAGNOSIS — F17210 Nicotine dependence, cigarettes, uncomplicated: Secondary | ICD-10-CM

## 2024-03-17 MED ORDER — BUDESONIDE-FORMOTEROL FUMARATE 80-4.5 MCG/ACT IN AERO: INHALATION_SPRAY | RESPIRATORY_TRACT | 12 refills | Status: DC

## 2024-03-17 NOTE — Assessment & Plan Note (Signed)
 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,  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 - See admit  02/12/24 with dry cough and cp with CTa c/w GG change but PCT neg  - 03/17/2024   Walked on RA  x  2  lap(s) =  approx 300  ft  @ slow/ slt unsteady  pace, stopped due to sob/fatigue with lowest 02 sats 97%     Symptoms are markedly disproportionate to objective findings and not clear to what extent this is actually a pulmonary  problem but pt does appear to have difficult to sort out respiratory symptoms of unknown origin for which  DDX  = almost all start with A and  include Adherence, Ace Inhibitors, Acid Reflux, Active Sinus Disease, Alpha 1 Antitripsin deficiency, Anxiety/depression masquerading as Airways dz,  ABPA,  Allergy(esp in young), Aspiration (esp in elderly), Adverse effects of meds,  Active smoking or Vaping, A bunch of PE's/clot burden (a few small clots can't cause this syndrome unless there is already severe underlying pulm or vascular dz with poor reserve),  Anemia or thyroid  disorder, plus two Bs  = Bronchiectasis and Beta blocker use..and one C= CHF    Doe labs pending but in meantime her lungs are clear on exam and she has normal sats walking so will take this opportunity   >>>try a lower dose of symbicort  ie  80 Take 2 puffs first thing in am and then another 2 puffs about 12 hours later and again plead with her to  stop all smoking at this point   >>> try change lopressor  form 100 mg daily to 50 mg bid to reduce peak BB effects which can overlap with AB   F/u in 4 weeks with all meds in hand using a trust but verify approach to confirm accurate Medication  Reconciliation The principal here is that until we are certain that the  patients are doing what we've asked, it makes no sense to ask them to do more.

## 2024-03-17 NOTE — Progress Notes (Unsigned)
 [name], female    DOB: [birth year]    MRN: [id number]  Brief patient profile:  51  yowm active smoker/MM   referred to pulmonary clinic in [place]  02/21/2023 by Almarie Crate  for ? Copd - had CB x around 2018 then admitted April 2024 [facility]   Admit date: 09/02/2022 Discharge date: 09/09/2022   Brief Hospitalization Summary: Please see all hospital notes, images, labs for full details of the hospitalization. ADMISSION HPI:  53 year old female with a history of hypertension, chronic abdominal pain, IBS, colitis, constipation, chronic nausea presenting with worsening generalized abdominal pain Last 24 hours prior to admission.  The patient's spouse at the bedside supplements the history.  The patient is a difficult historian.  Notably, the patient's spouse states that the patient has developed chest congestion, coughing, and some dyspnea on exertion over the past week.  She continues to smoke 1 pack/day.  She has not been on any recent antibiotics.  She has had some generalized weakness.  She has had decreased oral intake.  However, in the last 24 hours prior to admission she developed numerous episodes of nausea and vomiting.  There is no hematemesis.  The patient has not had any diarrhea.  There is no hematochezia or melena.  Spouse also relates that the patient has had some symptoms of urinary urgency in the last day to 2 days.  The patient normally takes BC powders 2 times per week for pain.  There is no hematuria or hemoptysis.  There is no hematemesis. Because of her worsening condition, the patient was brought to emergency department for further evaluation and treatment.   The patient has an extensive GI history for her abdominal pain and IBS and colitis.  Notably, in April 2022 the patient was noted to have colonic wall thickening/colitis on CT.  She was  treated empirically for IBD/suspected Crohn's with prednisone  and Entocort with follow-up colonoscopy in February 2023 (off all  steroids for several months)   normal other than nonbleeding internal hemorrhoids and normal colon biopsies.   More recently, the patient was seen in the emergency department on 05/27/2022 with worsening abdominal pain, persistent vomiting throughout the day. CT A/P showed diffuse colonic wall thickening extending from distal transverse colon throughout the rectosigmoid junction consistent with inflammatory or infectious colitis. She was given a 5-day course of prednisone . She also had CT angiography 06/05/2022 that showed patent mesenteric vasculature. She was prescribed a 5-day course of Cipro .   She was most recently in the GI clinic on 08/23/2022 for her abdominal pain, IBS, and possible colitis.  It was felt that her chronic abdominal pain was multifactorial including IBS, bowel hypersensitivity syndrome, and functional abdominal pain.  She was instructed to continue dicyclomine  and Linzess . Regarding her chronic nausea and vomiting she has had extensive workup withMRI abdomen in 2022 without biliary abnormalities. Gastric emptying study normal in 2023 and EGD February 2023 consistent with Candida esophagitis and gastritis.   In the ED, the patient was afebrile hemodynamically stable with oxygen  saturation 90% on room air.  WBC 16.9, hemoglobin 8.4, platelets 222,000.  Sodium 136, potassium 3.0, bicarbonate 25, serum creatinine 0.92.  AST 47, ALT 18, alk phosphatase 187, total bilirubin 0.6.  CTA abdomen was negative for any contrast extravasation.  Did show focal area of swelling and decreased enhancement in the left kidney with mild fat perinephric stranding.  There was bowel thickening and mucosal enhancement in the ascending and proximal transverse colon.  CTA chest was negative  for any aortic dissection.  There is moderate GGO bilateral with nodular consolidation.  The patient was started on ceftriaxone  and metronidazole .    Assessment/Plan:   Severe Sepsis due to UTI  -presented with fever  (103.5) and leukocytosis -due to bacteremia, pyelonephritis and PNA -lactic peaked at 5.0 -initially on  ceftriaxone , azithro, metronidazole  -follow blood culture--ESBL E coli  -changed to meropenem /ertapenem  for a full 10 day course    E. coli bacteremia -Preliminary multiplex PCR suggest ESBL -Discontinue ceftriaxone  -continue meropenem  -Discontinue metronidazole  -ID consult requested and recommendation is for 10 day course of meropenem /ertapenem     Acute respiratory failure with hypoxia -Presented with tachypnea and hypoxia with saturation 90% on room air -Secondary to pneumonia in the setting of COPD -Stable on 2 L nasal cannula>>weaned to RA -Wean oxygen  as tolerated for saturation greater 92% -09/04/22 evening--pt had respiratory distress -09/04/22 CXR--personally reviewed--bilateral interstitial and nodular infiltrates -weaned down to 2L prior to discharge   Acute pulmonary edema - TREATED  -09/04/22 evening--respiratory distress -start IV lasix  - transitioned to oral lasix  to start 09/09/22  -BNP 2180 -Echo - EF 25-30%  -DC on oral lasix  40 mg daily with potassium supplement -pt has appt to follow up with cardiology on 09/24/22   Echo 10/02/22  wnl  with cards opinion =  Her cardiomyopathy was in the setting of her past acute illness (sepsis), NICM.    History of Present Illness  02/21/2023  Pulmonary/ 1st office eval/ [name] / [place] [facility] on spiriva dpi  Chief Complaint  Patient presents with   Consult  Dyspnea:  only goes out to go to doctor - just walking room to room at home  Cough: p supper sometimes worse / min mucoid  Sleep: bed if flat with 3 pillows and difficult to breath is lies flatter SABA use: duoneb helps at hs and saba x 3-4 has spiriva 02: 4 lpm at hs : 4lpm p exert  Lung cancer screen: referred today Rec Plan A = Automatic = Always=   Symbicort  80 (same as Breztri  x 1 puff)  Take 2 puffs first thing in am and then another 2 puffs about 12 hours  later.  Work on inhaler technique:  Plan B = Backup (to supplement plan A, not to replace it) Only use your albuterol  inhaler as a rescue medication  Plan C = Crisis (instead of Plan B but only if Plan B stops working) - only use your albuterol - ipatropium nebulizer if you first try Plan B  Also  Ok to try albuterol  15 min before an activity (on alternating days)  that you know would usually make you short of breath Protonix  40mg   Take 30- 60 min before your first and last meals of the day and Pepcid  20 mg after supper GERD diet reviewed, bed blocks rec    The key is to stop smoking completely before smoking completely stops you!        04/04/2023  f/u ov/[name] office/Dyron Kawano re: GOLD ? Copd / AB  maint on Breztri    Chief Complaint  Patient presents with   COPD  Dyspnea:  short of breath across the house without 02  Cough: after supper worse non productive  Sleeping: bed is flat 3 pillows or feel chokin /suffocating    x  years /also overt hb  SABA use: several x per day never pre treats  02: 2lpm at bedtime / same day time  but only at rest, not with activity  Lung cancer screening:  not due until 08/2023  Rec Take metaprolol 100mg  one half pill twice daily rather than a whole  pill once a day Plan A = Automatic = Always=    Symbicort  80 Take 2 puffs first thing in am and then another 2 puffs about 12 hours later.   Work on inhaler technique:  Plan B = Backup (to supplement plan A, not to replace it) Only use your albuterol  inhaler as a rescue medication Also  Ok to try albuterol  15 min before an activity (on alternating days)  that you know would usually make you short of breath No need for 02 except at bedtime      - PFTs 08/06/23  with insp portion F/V loop c/w truncation but not a true plateau  08/06/23 typical of VCD  LDSCT  09/27/23 Centrilobular and paraseptal emphysema / RADS 1   02/04/2024  f/u ov/[place] office/Saachi Zale re:  doe/ emphysema on LDSCT still  smoking s  obst on pfts 08/06/23  maint on symbiicort 80  / still smoking and using nicotine  patch same time.  Chief Complaint  Patient presents with   Medical Management of Chronic Issues   COPD    Increased SOB x 2 months. She has had CP off and on over the past wk.  She has had swelling in her legs x 2 wks.    Dyspnea: 10- 15 min activities of daily living then has to take a break  Cough: dry  Sleeping: flat bed / 4 pillows s    resp cc  SABA use: 2-3 x per day  02: 2 cont walking and sleeping  Lung cancer screening: RADS 1  09/27/23  New ant R cp with deep breath x sev weeks somewhat positional  Patient Instructions  The key is to stop smoking completely before smoking completely stops you! - you should not smoke at all while wearing the patch. Make sure you check your oxygen  saturation  AT  your highest level of activity (not after you stop)   to be sure it stays over 90%    try albuterol  15 min before an activity (on alternating days)  that you know would usually make you short of breath         Admit date: 02/12/2024 Discharge date: 02/13/2024     PCP: Bevely Doffing, FNP   DISCHARGE DIAGNOSES:  Principal Problem:   Hyponatremia   Gastroesophageal reflux disease   CAP (community acquired pneumonia)   Essential hypertension   COPD (chronic obstructive pulmonary disease) (HCC)   Chronic respiratory failure with hypoxia (HCC)   Acquired hypothyroidism   Heart failure with recovered ejection fraction (HFrecEF) (HCC)     PATIENT LEFT AGAINST MEDICAL ADVISE   INITIAL HISTORY:  53 y.o. female with medical history significant of hypertension, hyperlipidemia, COPD on 2 LPM of oxygen  at home, history of HFrecEF, nonischemic cardiomyopathy, history of PE (not on blood thinners) who presents to the emergency department due to elevated D-dimer, chest pain and shortness of breath. Patient went to pulmonologist on 9/23, blood work was done and patient saw abnormal result in MyChart , but she was  unable to communicate with anyone from the pulmonologist office, she had a follow-up with cardiologist today, positive D-dimer was noted on the recent blood work done and patient was asked to go to the ED for evaluation.  She underwent CT angiogram of the chest.  PE was ruled out but she was found to have patchy opacities.  She was hospitalized for  further management.    HOSPITAL COURSE:    Hyponatremia Was noted to have a sodium level of 114.  Sodium was 139 in February.  No labs noted in Care Everywhere. Patient mentions that due to lower extremity edema the dose of her diuretics were increased recently.  Hyponatremia is most likely acute.  Most likely due to diuretic use. Patient was given normal saline boluses.  Looks like she was given 3 L of normal saline along with 500 mL bolus of LR.  Sodium level increased from 1 14-1 19-1 31 this morning.  Overcorrection of sodium noted.  Patient has been given a dose of desmopressin.  She was been started on D5 infusion.  Will recheck sodium levels later today and tonight and then tomorrow again.   Urine osmolality and urine sodium level is pending.   Community-acquired pneumonia COVID-19 influenza and RSV PCR negative. Patient came in with chest pain.  PE was ruled out.  No concern for pulmonary edema but concern for pneumonia was raised.  Could also be inflammatory in origin.  Since her WBC is normal and procalcitonin is unremarkable we will check inflammatory markers. Procalcitonin level is less than 0.1.  Continue with ceftriaxone  for now.  Doxycycline was also ordered but she refused due to allergy.  Allergic to macrolides as well. Does not seem to have any oxygen  requirements.  She mentioned that she does use oxygen  mainly at nighttime at home. Follow-up with pulmonology at discharge   Chronic respiratory failure with hypoxia On nocturnal oxygen  at home.   Normocytic anemia Drop in hemoglobin is likely dilutional.  No evidence of overt bleeding.   Continue to monitor.   Acquired hypothyroidism Continue levothyroxine .   Essential hypertension Low blood pressures noted.  Antihypertensives on hold.   Chronic systolic CHF EF was 25 to 30% in 2024.  Within a month of that EF had recovered. Followed by cardiology.  Diuretics on hold due to hyponatremia.  Other cardiac medications on hold due to hypotension.  No active cardiac issues currently.   Abnormal LFTs Elevated alkaline phosphatase, AST and ALT noted.  Old labs reviewed and she has always had some degree of abnormal LFTs previously.  Will check hepatitis panel.  Right upper quadrant ultrasound.  No recent CT scan or ultrasound reports available.     PATIENT LEFT AGAINST MEDICAL ADVISE   03/17/2024  f/u ov/[place] office/[name] re: doe / GOLD 0   copd/ emphysema/ anemia   maint on symbicort  160   still  doing some smoking  Chief Complaint  Patient presents with   Shortness of Breath    Had double pna / shob is better   Dyspnea:  mostly housebound x for the doctor visits  Cough: still coughing green mucus not worse in am / sporadic thru the day and takes cough med at hs = delsym / no better with abx  Sleeping: bed flat/ 3-4 pillows  resp cc  SABA use: up to 3 x hfa always p ex/ neb once a day  02: 2lpm 24/7     No obvious day to day or daytime variability or assoc  or mucus plugs or hemoptysis or cp or chest tightness, subjective wheeze or overt sinus or hb symptoms.    Also denies any obvious fluctuation of symptoms with weather or environmental changes or other aggravating or alleviating factors except as outlined above   No unusual exposure hx or h/o childhood pna/ asthma or knowledge of premature birth.  Current Allergies, Complete Past  Medical History, Past Surgical History, Family History, and Social History were reviewed in [employer] record.  ROS  The following are not active complaints unless bolded Hoarseness, sore throat, dysphagia,  dental problems, itching, sneezing,  nasal congestion or discharge of excess mucus or purulent secretions, ear ache,   fever, chills, sweats, unintended wt loss or wt gain, classically pleuritic or exertional cp,  orthopnea pnd or arm/hand swelling  or leg swelling, presyncope, palpitations, abdominal pain, anorexia, nausea, vomiting, diarrhea  or change in bowel habits or change in bladder habits, change in stools or change in urine, dysuria, hematuria,  rash, arthralgias, visual complaints, headache, numbness, weakness or ataxia or problems with walking or coordination,  change in mood or  memory.        Current Meds  Medication Sig   atorvastatin  (LIPITOR) 40 MG tablet Take 1 tablet (40 mg total) by mouth daily.   budesonide -formoterol  (SYMBICORT ) 80-4.5 MCG/ACT inhaler Take 2 puffs first thing in am and then another 2 puffs about 12 hours later.   Buprenorphine HCl-Naloxone HCl 8-2 MG FILM Place 1 Film under the tongue 2 (two) times daily.   dicyclomine  (BENTYL ) 20 MG tablet TAKE ONE TABLET BY MOUTH THREE TIMES DAILY BEFORE MEALS (Patient taking differently: Take 20 mg by mouth 3 (three) times daily before meals.)   famotidine  (PEPCID ) 20 MG tablet Take 20 mg by mouth daily as needed (For break through heart burn).   FLUoxetine  (PROZAC ) 40 MG capsule Take 2 capsules (80 mg total) by mouth daily.   furosemide  (LASIX ) 40 MG tablet TAKE ONE TABLET BY MOUTH ONCE DAILY AS NEEDED FOR SWELLING.   hydrOXYzine  (ATARAX ) 50 MG tablet TAKE ONE TABLET BY MOUTH UP TO THREE TIMES DAILY AS NEEDED FOR ANXIETY (Patient taking differently: Take 50 mg by mouth 3 (three) times daily as needed for anxiety.)   ipratropium-albuterol  (DUONEB) 0.5-2.5 (3) MG/3ML SOLN Take 3 mLs by nebulization every 4 (four) hours as needed (SOB, wheezing, coughing).   levothyroxine  (SYNTHROID ) 25 MCG tablet Take 1 tablet (25 mcg total) by mouth daily.   metoprolol  succinate (TOPROL -XL) 100 MG 24 hr tablet TAKE ONE TABLET BY MOUTH ONCE  DAILY WITH OR IMMEDIATELY FOLLOWING A MEAL (Patient taking differently: Take 100 mg by mouth daily.)   Nebulizer System All-In-One MISC 1 Device by Does not apply route as directed.   nicotine  (NICOTINE  STEP 2) 14 mg/24hr patch Place 1 patch (14 mg total) onto the skin daily.   nitroGLYCERIN  (NITROSTAT ) 0.4 MG SL tablet Place 1 tablet (0.4 mg total) under the tongue every 5 (five) minutes as needed for chest pain.   OLANZapine (ZYPREXA) 5 MG tablet Take 5 mg by mouth daily. (Patient taking differently: Take 10 mg by mouth daily.)   ondansetron  (ZOFRAN -ODT) 8 MG disintegrating tablet Take 1 tablet (8 mg total) by mouth every 8 (eight) hours as needed for nausea or vomiting. TAKE ONE TABLET BY MOUTH EVERY 8 HOURS AS NEEDED FOR NAUSEA AND VOMITING   OXYGEN  Inhale 2 L into the lungs continuous.   pantoprazole  (PROTONIX ) 40 MG tablet TAKE ONE TABLET BY MOUTH TWICE DAILY WITH FOOD BREAKFAST AND SUPPER   promethazine  (PHENERGAN ) 25 MG tablet Take 1 tablet (25 mg total) by mouth every 8 (eight) hours as needed for nausea or vomiting. TAKE ONE TABLET BY MOUTH EVERY 8 HOURS AS NEEDED FOR NAUSEA AND VOMITING   sacubitril -valsartan  (ENTRESTO ) 24-26 MG TAKE ONE TABLET BY MOUTH TWICE DAILY   spironolactone  (ALDACTONE ) 25 MG tablet  TAKE ONE-HALF TABLET BY MOUTH ONCE DAILY   temazepam  (RESTORIL ) 30 MG capsule Take 30 mg by mouth at bedtime as needed for sleep.   VENTOLIN  HFA 108 (90 Base) MCG/ACT inhaler INHALE TWO PUFFS INTO THE LUNGS EVERY 6 HOURS AS NEEDED FOR SHORTNESS OF BREATH OR FOR WHEEZING (Patient taking differently: Inhale 2 puffs into the lungs every 6 (six) hours as needed for shortness of breath or wheezing.)   [DISCONTINUED] SYMBICORT  160-4.5 MCG/ACT inhaler Take 2 puffs first thing in am and then another 2 puffs about 12 hours later. (Patient taking differently: Inhale 2 puffs into the lungs in the morning and at bedtime. Take 2 puffs first thing in am and then another 2 puffs about 12 hours later.)                     Past Medical History:  Diagnosis Date   Chronic abdominal pain    Chronic back pain    Colitis    Constipation    GERD (gastroesophageal reflux disease)    Gram-negative bacteremia 09/03/2022   Hypertension    Migraine    Plumbism    blood clot    PONV (postoperative nausea and vomiting)    Pyelonephritis 09/02/2022   Renal mass    Sepsis due to Escherichia coli (E. coli) (HCC) 09/03/2022   Sepsis due to undetermined organism (HCC) 09/02/2022   Uterine cancer [facility])    age 51      Objective:    wts  03/20/2024        148  02/04/2024       153  08/06/2023       152   04/04/2023     137   02/21/23 136 lb 8 oz (61.9 kg)  02/11/23 133 lb 6.4 oz (60.5 kg)  11/30/22 127 lb (57.6 kg)    Vital signs reviewed  03/17/2024  - Note at rest 02 sats  94% on RA    General appearance:    amb somber wf nad      HEENT : Oropharynx  clear       Nasal turbinates nl    NECK :  without  apparent JVD/ palpable Nodes/TM    LUNGS: no acc muscle use,  Nl contour chest which is clear to A and P bilaterally without cough on insp or exp maneuvers   CV:  RRR  no s3 or murmur or increase in P2, and no edema   ABD:  soft and nontender   MS:  Gait slightly unsteady   ext warm without deformities Or obvious joint restrictions  calf tenderness, cyanosis or clubbing    SKIN: warm and dry without lesions    NEURO:  alert, approp, nl sensorium with  no motor or cerebellar deficits apparent.    Labs ordered/ reviewed:      Chemistry      Component Value Date/Time   NA 138 03/17/2024 0857   K 5.0 03/17/2024 0857   CL 102 03/17/2024 0857   CO2 20 03/17/2024 0857   BUN 12 03/17/2024 0857   CREATININE 1.07 (H) 03/17/2024 0857      Component Value Date/Time   CALCIUM  8.9 03/17/2024 0857   ALKPHOS 192 (H) 02/13/2024 0531   AST 63 (H) 02/13/2024 0531   ALT 59 (H) 02/13/2024 0531   BILITOT <0.2 02/13/2024 0531   BILITOT <0.2 06/14/2023 1403        Lab Results   Component Value Date  WBC 8.8 03/17/2024   HGB 10.1 (L) 03/17/2024   HCT 33.1 (L) 03/17/2024   MCV 86 03/17/2024   PLT 414 03/17/2024     Lab Results  Component Value Date   HGB 10.1 (L) 03/17/2024   HGB 8.2 (L) 02/18/2024   HGB 8.4 (L) 02/13/2024   HGB 10.4 (L) 02/12/2024   HGB 9.9 (L) 02/04/2024     Lab Results  Component Value Date   DDIMER 1.18 (H) 02/04/2024      Lab Results  Component Value Date   TSH 3.710 01/28/2024     BNP  =   210    03/17/2024      Lab Results  Component Value Date   ESRSEDRATE 91 (H) 03/17/2024          Assessment     Assessment & Plan DOE (dyspnea on exertion) Reported doe room to room 04/04/2023 with clear lungs on exam - 04/04/2023   Walked on RA  x  3  lap(s) =  approx 450  ft  @ slow  pace,   with lowest 02 sats 98%  patient c/o fatigue and shob during 2nd lap; patient was very unsteady and wobbly during walk   - 04/04/2023 referred to pulmonary rehab  - 02/04/2024   Walked on RA  x  1  lap(s) =  approx 150  ft  @ slow/ unsteady  pace, stopped due to tired > SOB   with lowest 02 sats 98%   - 03/17/2024   Walked on RA   x  2  lap(s) =  approx 300  ft  @ mod pace, stopped due to fatigue/ sob with lowest 02 sats 97%  - 03/17/2024  doe labs with bnp 210,  ESR 91 unexplained    Upper airway cough syndrome Onset ? P started entresto  ?  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 >>>    03/17/2024 try lower dose of symbicort  to avoid aggravating the upper airway component of her problems   Chronic asthmatic bronchitis (HCC) Active smoker/MM - 02/21/2023  After extensive coaching inhaler device,  effectiveness =    75% from a baseline of 50% > try breztri  sample then change to symbicort  80 1 bid to address cough and stop spiriva > did not change to symbicort  as of 04/04/2023 and cough early insp present > try again symb 80 2 bid  - PFT's  08/06/23  FEV1 1.44 (59 % ) ratio 0.79  p 3 % improvement from saba p  symbicort  80 prior to study with DLCO  12 (64%)   and FV curve flattened on insp   ERV 19% at wt 150  - 02/04/2024  After extensive coaching inhaler device,  effectiveness = 75% (short ti  for hfa) - Allergy screen 02/04/2024 >  Eos 0.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,  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 - See admit  02/12/24 with dry cough and cp with CTa c/w GG change but PCT neg  - 03/17/2024   Walked on RA  x  2  lap(s) =  approx 300  ft  @ slow/ slt unsteady  pace, stopped due to sob/fatigue with lowest 02 sats 97%     Symptoms are markedly disproportionate to objective findings and not clear to what extent this is actually a pulmonary  problem but pt does appear to have difficult to sort out respiratory symptoms of unknown origin for which  DDX  =  almost all start with A and  include Adherence, Ace Inhibitors, Acid Reflux, Active Sinus Disease, Alpha 1 Antitripsin deficiency, Anxiety/depression masquerading as Airways dz,  ABPA,  Allergy(esp in young), Aspiration (esp in elderly), Adverse effects of meds,  Active smoking or Vaping, A bunch of PE's/clot burden (a few small clots can't cause this syndrome unless there is already severe underlying pulm or vascular dz with poor reserve),  Anemia or thyroid  disorder, plus two Bs  = Bronchiectasis and Beta blocker use..and one C= CHF    Doe labs no specific finding, anemia improved ,    her lungs are clear on exam and she has normal sats walking so will take this opportunity   >>>try a lower dose of symbicort  ie  80 Take 2 puffs first thing in am and then another 2 puffs about 12 hours later and again plead with her to stop all smoking at this point   >>> try change lopressor  form 100 mg daily to 50 mg bid to reduce peak BB effects which can overlap with AB   F/u in 4 weeks with all meds in hand using a trust but verify approach to confirm accurate Medication  Reconciliation The principal here is that until we are certain that the  patients are doing what we've asked, it makes no  sense to ask them to do more.    Nocturnal hypoxemia Continue on 2lpm hs as of last admit 02/12/24  >>>   not needed at rest or ambulation based on walking study 03/17/2024   Cigarette smoker Counseled re importance of smoking cessation but did not meet time criteria for separate billing    Each maintenance medication was reviewed in detail including emphasizing most importantly the difference between maintenance and prns and under what circumstances the prns are to be triggered using an action plan format where appropriate.  Total time for H and P, chart review, counseling, reviewing hfa /02 pulse ox/ neb device(s) , directly observing portions of ambulatory 02 saturation study/ and generating customized AVS unique to this office visit / same day charting = 45 min                AVS  Patient Instructions  Plan A = Automatic = Always=    Symbicort  80 Take 2 puffs first thing in am and then another 2 puffs about 12 hours later.    Work on inhaler technique:  relax and gently blow all the way out then take a nice smooth full deep breath back in, triggering the inhaler at same time you start breathing in.  Hold breath in for at least  5 seconds if you can. Blow out symbicort   thru nose. Rinse and gargle with water when done.  If mouth or throat bother you at all,  try brushing teeth/gums/tongue with arm and hammer toothpaste/ make a slurry and gargle and spit out.       Plan B = Backup (to supplement plan A, not to replace it) Use your albuterol  inhaler as a rescue medication to be used if you can't catch your breath by resting or slowing your pace  or doing a relaxed purse lip breathing pattern.  - The less you use it, the better it will work when you need it. - Ok to use the inhaler up to 2 puffs  every 4 hours if you must but call for appointment if use goes up over your usual need - Don't leave home without it !!  (think  of it like the spare tire or starter fluid for your car)   Plan  C = Crisis (instead of Plan B but only if Plan B stops working) - only use your albuterol  nebulizer if you first try Plan B and it fails to help > ok to use the nebulizer up to every 4 hours but if start needing it regularly call for immediate appointment  Also  Ok to try albuterol  15 min before an activity (on alternating days)  that you know would usually make you short of breath and see if it makes any difference and if makes none then don't take albuterol  after activity unless you can't catch your breath as this means it's the resting that helps, not the albuterol .       Make sure you check your oxygen  saturation  AT  your highest level of activity (not after you stop)   to be sure it stays over 90% and adjust  02 flow upward to maintain this level if needed but remember to turn it back to previous settings when you stop (to conserve your supply).    Change lopressor  to take one half pill twice daily   The key is to stop smoking completely before smoking completely stops you!   Please remember to go to the lab department   for your tests - we will call you with the results when they are available.  Please schedule a follow up office visit in 6 weeks, call sooner if needed with all medications /inhalers/ solutions in hand so we can verify exactly what you are taking. This includes all medications from all doctors and over the counters             Ozell America, MD 03/20/2024

## 2024-03-17 NOTE — Assessment & Plan Note (Addendum)
 Counseled re importance of smoking cessation but did not meet time criteria for separate billing    Each maintenance medication was reviewed in detail including emphasizing most importantly the difference between maintenance and prns and under what circumstances the prns are to be triggered using an action plan format where appropriate.  Total time for H and P, chart review, counseling, reviewing hfa /02 pulse ox/ neb device(s) , directly observing portions of ambulatory 02 saturation study/ and generating customized AVS unique to this office visit / same day charting = 45 min

## 2024-03-17 NOTE — Patient Instructions (Addendum)
 Plan A = Automatic = Always=    Symbicort  80 Take 2 puffs first thing in am and then another 2 puffs about 12 hours later.    Work on inhaler technique:  relax and gently blow all the way out then take a nice smooth full deep breath back in, triggering the inhaler at same time you start breathing in.  Hold breath in for at least  5 seconds if you can. Blow out symbicort   thru nose. Rinse and gargle with water when done.  If mouth or throat bother you at all,  try brushing teeth/gums/tongue with arm and hammer toothpaste/ make a slurry and gargle and spit out.       Plan B = Backup (to supplement plan A, not to replace it) Use your albuterol  inhaler as a rescue medication to be used if you can't catch your breath by resting or slowing your pace  or doing a relaxed purse lip breathing pattern.  - The less you use it, the better it will work when you need it. - Ok to use the inhaler up to 2 puffs  every 4 hours if you must but call for appointment if use goes up over your usual need - Don't leave home without it !!  (think of it like the spare tire or starter fluid for your car)   Plan C = Crisis (instead of Plan B but only if Plan B stops working) - only use your albuterol  nebulizer if you first try Plan B and it fails to help > ok to use the nebulizer up to every 4 hours but if start needing it regularly call for immediate appointment  Also  Ok to try albuterol  15 min before an activity (on alternating days)  that you know would usually make you short of breath and see if it makes any difference and if makes none then don't take albuterol  after activity unless you can't catch your breath as this means it's the resting that helps, not the albuterol .       Make sure you check your oxygen  saturation  AT  your highest level of activity (not after you stop)   to be sure it stays over 90% and adjust  02 flow upward to maintain this level if needed but remember to turn it back to previous settings when you  stop (to conserve your supply).    Change lopressor  to take one half pill twice daily   The key is to stop smoking completely before smoking completely stops you!   Please remember to go to the lab department   for your tests - we will call you with the results when they are available.  Please schedule a follow up office visit in 6 weeks, call sooner if needed with all medications /inhalers/ solutions in hand so we can verify exactly what you are taking. This includes all medications from all doctors and over the counters

## 2024-03-17 NOTE — Assessment & Plan Note (Deleted)
 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,  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 - See admit  02/12/24 with dry cough and cp with CTa c/w GG change but 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  - 03/17/2024   Walked on RA  x  2  lap(s) =  approx 300  ft  @ slow/ slt unsteady  pace, stopped due to sob/fatigue with lowest 02 sats 97%    Symptoms are markedly disproportionate to objective findings and not clear to what extent this is actually a pulmonary  problem but pt does appear to have difficult to sort out respiratory symptoms of unknown origin for which  DDX  = almost all start with A and  include Adherence, Ace Inhibitors, Acid Reflux, Active Sinus Disease, Alpha 1 Antitripsin deficiency, Anxiety/depression masquerading as Airways dz,  ABPA,  Allergy(esp in young), Aspiration (esp in elderly), Adverse effects of meds,  Active smoking or Vaping, A bunch of PE's/clot burden (a few small clots can't cause this syndrome unless there is already severe underlying pulm or vascular dz with poor reserve),  Anemia or thyroid  disorder, plus two Bs  = Bronchiectasis and Beta blocker use..and one C= CHF    Doe labs pending but in meantime her lungs are clear on exam and she has normal sats walking so will take this opportunity   >>>try a lower dose of symbicort  ie  80 Take 2 puffs first thing in am and then another 2 puffs about 12 hours later and again plead with her to stop  all smoking at this point   >>> try change lopressor  form 100 mg daily to 50 mg bid to reduce peak BB effects which can overlap with AB   F/u in 4 weeks with all meds in hand using a trust but verify approach to confirm accurate Medication  Reconciliation The principal here is that until we are certain that the  patients are doing what we've asked, it makes no sense to ask them to do more.

## 2024-03-17 NOTE — Assessment & Plan Note (Addendum)
 Continue on 2lpm hs as of last admit 02/12/24  >>>   not needed at rest or ambulation based on walking study 03/17/2024

## 2024-03-18 LAB — CBC WITH DIFFERENTIAL/PLATELET
Basophils Absolute: 0.1 x10E3/uL (ref 0.0–0.2)
Basos: 1 %
EOS (ABSOLUTE): 0.5 x10E3/uL — ABNORMAL HIGH (ref 0.0–0.4)
Eos: 5 %
Hematocrit: 33.1 % — ABNORMAL LOW (ref 34.0–46.6)
Hemoglobin: 10.1 g/dL — ABNORMAL LOW (ref 11.1–15.9)
Immature Grans (Abs): 0 x10E3/uL (ref 0.0–0.1)
Immature Granulocytes: 0 %
Lymphocytes Absolute: 1.7 x10E3/uL (ref 0.7–3.1)
Lymphs: 19 %
MCH: 26.2 pg — ABNORMAL LOW (ref 26.6–33.0)
MCHC: 30.5 g/dL — ABNORMAL LOW (ref 31.5–35.7)
MCV: 86 fL (ref 79–97)
Monocytes Absolute: 0.9 x10E3/uL (ref 0.1–0.9)
Monocytes: 10 %
Neutrophils Absolute: 5.7 x10E3/uL (ref 1.4–7.0)
Neutrophils: 64 %
Platelets: 414 x10E3/uL (ref 150–450)
RBC: 3.85 x10E6/uL (ref 3.77–5.28)
RDW: 15.5 % — ABNORMAL HIGH (ref 11.7–15.4)
WBC: 8.8 x10E3/uL (ref 3.4–10.8)

## 2024-03-18 LAB — BASIC METABOLIC PANEL WITH GFR
BUN/Creatinine Ratio: 11 (ref 9–23)
BUN: 12 mg/dL (ref 6–24)
CO2: 20 mmol/L (ref 20–29)
Calcium: 8.9 mg/dL (ref 8.7–10.2)
Chloride: 102 mmol/L (ref 96–106)
Creatinine, Ser: 1.07 mg/dL — ABNORMAL HIGH (ref 0.57–1.00)
Glucose: 82 mg/dL (ref 70–99)
Potassium: 5 mmol/L (ref 3.5–5.2)
Sodium: 138 mmol/L (ref 134–144)
eGFR: 62 mL/min/1.73 (ref >59)

## 2024-03-18 LAB — BRAIN NATRIURETIC PEPTIDE: BNP: 209.9 pg/mL — ABNORMAL HIGH (ref 0.0–100.0)

## 2024-03-18 LAB — SEDIMENTATION RATE: Sed Rate: 91 mm/h — ABNORMAL HIGH (ref 0–40)

## 2024-03-19 ENCOUNTER — Ambulatory Visit: Payer: Self-pay | Admitting: Internal Medicine

## 2024-03-20 ENCOUNTER — Ambulatory Visit

## 2024-03-20 NOTE — Assessment & Plan Note (Addendum)
 Reported doe room to room 04/04/2023 with clear lungs on exam - 04/04/2023   Walked on RA  x  3  lap(s) =  approx 450  ft  @ slow  pace,   with lowest 02 sats 98%  patient c/o fatigue and shob during 2nd lap; patient was very unsteady and wobbly during walk   - 04/04/2023 referred to pulmonary rehab  - 02/04/2024   Walked on RA  x  1  lap(s) =  approx 150  ft  @ slow/ unsteady  pace, stopped due to tired > SOB   with lowest 02 sats 98%   - 03/17/2024   Walked on RA   x  2  lap(s) =  approx 300  ft  @ mod pace, stopped due to fatigue/ sob with lowest 02 sats 97%  - 03/17/2024  doe labs with bnp 210,  ESR 91 unexplained

## 2024-03-20 NOTE — Progress Notes (Signed)
 Call cannot be completed - will try again

## 2024-03-20 NOTE — Assessment & Plan Note (Addendum)
 Onset ? P started entresto  ?  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

## 2024-03-23 ENCOUNTER — Other Ambulatory Visit: Payer: Self-pay | Admitting: Gastroenterology

## 2024-03-23 DIAGNOSIS — R112 Nausea with vomiting, unspecified: Secondary | ICD-10-CM

## 2024-03-25 ENCOUNTER — Other Ambulatory Visit: Payer: Self-pay | Admitting: Internal Medicine

## 2024-03-25 NOTE — Progress Notes (Signed)
 GI Office Note    Referring Provider: Bevely Doffing, FNP Primary Care Physician:  Bevely Doffing, FNP Primary Gastroenterologist: Carlin POUR. Cindie, DO  Date:  03/26/2024  ID:  [name], DOB 1970/07/18, MRN [id number]  Chief Complaint   Chief Complaint  Patient presents with   Follow-up    Follow up. Refill on medications    History of Present Illness  [name] is a 53 y.o. female with a history of uterine cancer, opioid abuse, sepsis secondary to E. coli in 2024, PTSD, migraines, GERD, depression, COPD, CKD, CHF, HTN, chronic abdominal pain and colitis that was suspicious for Crohn's disease and was treated with prednisone  and Entocort previously, more recently suspected to be more IBS, constipation, anxiety/depression presenting today with need for medication refills.  Admitted in April 2024 after presenting with worsening abdominal pain and found to have sepsis in the setting of bacteremia, pyelonephritis, pneumonia, concern for colitis on CT.  C. difficile and GI pathogen panel were negative.  She was treated empirically with antibiotics.  Recommended outpatient colonoscopy. She was also found to have anemia with hemoglobin 8.4 on admission, down from 13.2 in January 2024, heme-negative, but iron and iron saturation low.  Hemoglobin declined as low as 6.5 with no overt GI bleeding.  She has been taking 1-2 BC powders daily for headaches outpatient.  She received 2 units PRBCs and hemoglobin improved to 12.5, 10.5 at the time of discharge.  We had initially considered EGD inpatient, but echocardiogram revealed EF of 25-30% with circumferential pericardial effusion.  Recommended against inpatient procedures.  Could consider outpatient once medically improved.   Repeat echo 5/21 with EF improved to 60-65%.    OV 11/30/2022.  She felt that her colitis was flaring again.  Reported having frequent watery diarrhea, primarily upper abdominal pain for the last 2 days, consistent with  her prior flares.  Reported prior to this, she had been doing well with 1-2 formed stools daily with Bentyl  20 mg twice daily.  Had not really been having any abdominal pain.  Her chronic nausea was well-controlled with Zofran  and Phenergan .  She denies any recent antibiotics, travel, medication changes, sick contacts.  Denied NSAIDs.  GERD remains well-controlled. She was taking PPI BID and denied NISAD use (discontinued BC powders).  Unfortunately, due to global epic/computer outage, we were unable to obtain stat CT.  She was started on Cipro  and Flagyl  empirically for suspected colitis.  Labs and stool studies were ordered and advised patient to have them completed ASAP.  Would need colonoscopy and EGD once acute symptoms resolved for follow-up on colitis and IDA.   Last office visit 08/29/23 with Josette Centers, PA-C. Gaining weight but denied other problems. Denied abdominal pain, taking bentyl  TID and bowels moving well, no rectal bleeding or melena. Gerd controlled on pantoprazole  BID without dysphagia. Alternates zofran  and phenergan  for her chronic nausea and that is working well for her. Not taking iron and fairly recent labs were stable. Given elevated LFTs additional labs ordered (CBC, iron panel, HFP, ANA, AMA, ASMA, immunoglobulins, hep B labs, Hep A labs, alpha 1 antitrypsin. Advised colonoscopy pending her labs and evaluation of her weight gain. Continue bentyl , PPI, and antiemetics. RUQ US  ordered.   Liver serologies ordered have not been performed. EGD nor colonoscopy have been scheduled.   Seen by cardiology last in August 2025.  Wearing oxygen  for oxygen  that will need she has been using it as needed.  Admits significant fatigue, several falls since  last visit and broke her hand and required surgery.  Denied any chest pain, palpitations, syncope, dizziness, acute bleeding, or claudication.  Taking Lasix  as needed.  Seeing a therapist.  Having shortness of breath and chronic chest congestion  and some weight gain.  Advise follow-up in 4 to 6 weeks or sooner with cardiology.  Advised that if BP not within goal in 2-3 weeks to start Entresto .  Recently seen by Dr. Darlean 03/17/2024.  She was advised to continue on meropenem  and discontinue ceftriaxone  and ID consult requested with recommendation for meropenem /ertapenem  for 10 days for her E. coli bacteremia.  She apparently left AGAINST MEDICAL ADVICE previously.  Her lungs were clear on exam and normal sats with walking.  Advised Symbicort  80 and try a change of Lopressor  from 100 mg daily to 50 mg twice daily to reduce peak beta-blocker effects.  She is advised continue her oxygen  at night and counseled on smoking cessation.  CBC    Latest Ref Rng & Units 03/17/2024    8:57 AM 02/18/2024    3:29 PM 02/13/2024    5:31 AM  CBC  WBC 3.4 - 10.8 x10E3/uL 8.8  9.4  6.6   Hemoglobin 11.1 - 15.9 g/dL 89.8  8.2  8.4   Hematocrit 34.0 - 46.6 % 33.1  26.9  26.2   Platelets 150 - 450 x10E3/uL 414  466  338         Latest Ref Rng & Units 03/17/2024    8:57 AM 02/18/2024    3:29 PM 02/13/2024    5:31 AM 02/12/2024    6:52 PM 02/12/2024    4:24 PM  CMP  Glucose 70 - 99 mg/dL 82  87  91  88  89   BUN 6 - 24 mg/dL 12  10  11  18  19    Creatinine 0.57 - 1.00 mg/dL 8.92  8.99  9.11  8.88  1.18   Sodium 134 - 144 mmol/L 138  131  131  119  114   Potassium 3.5 - 5.2 mmol/L 5.0  4.3  4.1  4.0  4.3   Chloride 96 - 106 mmol/L 102  92  98  81  80   CO2 20 - 29 mmol/L 20  21  21  24  22    Calcium  8.7 - 10.2 mg/dL 8.9  8.5  8.1  8.8  8.6   Total Protein 6.5 - 8.1 g/dL   6.2   7.7   Total Bilirubin 0.0 - 1.2 mg/dL   <9.7   <9.7   Alkaline Phos 38 - 126 U/L   192   232   AST 15 - 41 U/L   63   63   ALT 0 - 44 U/L   59   70     Today:  Discussed the use of AI scribe software for clinical note transcription with the patient, who gave verbal consent to proceed.  Her current medication regimen is effectively managing her symptoms. She takes dicyclomine   three times a day for abdominal pain, which is primarily located in the middle to lower abdomen. The pain can be severe if she misses a dose. The medication also helps with her loose stools. She takes Protonix  twice daily for gastrointestinal symptoms and uses Pepcid  over-the-counter as needed, approximately twice a week, depending on her diet. No breakthrough symptoms other than those managed by Pepcid . No chest pain or trouble swallowing. Denies melena or brbpr. Does have some baseline shortness  of breath with activity and lying flat.   She experienced pneumonia last month, for which she was hospitalized and treated with antibiotics. She reports improvement after receiving treatment at home. She is attempting to quit smoking and is currently using nicotine  patches, having reduced her smoking to half a pack per day. During her pneumonia, she smoked minimally.  She alternates between Zofran  and Phenergan  for nausea, using them as needed. She typically starts with Zofran  and switches to Phenergan  if necessary.  She is on Entresto  for heart failure and metoprolol , which she takes in divided doses. Her sodium levels have normalized, and her hemoglobin is stable around 10, which is close to her baseline. She has chronic anemia, likely related to kidney insufficiency, but denies any bleeding, falls, or dizziness.  She reports having one to two bowel movements per day, which can vary based on her diet. She experiences tenderness in the abdominal area, particularly around the site of her previous gallbladder surgery.      Wt Readings from Last 6 Encounters:  03/26/24 149 lb 12.8 oz (67.9 kg)  03/17/24 148 lb (67.1 kg)  02/18/24 156 lb (70.8 kg)  02/12/24 156 lb 12.8 oz (71.1 kg)  02/12/24 156 lb 12.8 oz (71.1 kg)  02/04/24 153 lb 9.6 oz (69.7 kg)    Body mass index is 28.3 kg/m.   Current Outpatient Medications  Medication Sig Dispense Refill   atorvastatin  (LIPITOR) 40 MG tablet Take 1 tablet (40  mg total) by mouth daily. 90 tablet 3   budesonide -formoterol  (SYMBICORT ) 80-4.5 MCG/ACT inhaler Take 2 puffs first thing in am and then another 2 puffs about 12 hours later. 10.2 g 12   famotidine  (PEPCID ) 20 MG tablet Take 20 mg by mouth daily as needed (For break through heart burn).     FLUoxetine  (PROZAC ) 40 MG capsule Take 2 capsules (80 mg total) by mouth daily. 180 capsule 1   furosemide  (LASIX ) 40 MG tablet TAKE ONE TABLET BY MOUTH ONCE DAILY AS NEEDED FOR SWELLING. 30 tablet 1   ipratropium-albuterol  (DUONEB) 0.5-2.5 (3) MG/3ML SOLN Take 3 mLs by nebulization every 4 (four) hours as needed (SOB, wheezing, coughing). 360 mL 1   levothyroxine  (SYNTHROID ) 25 MCG tablet Take 1 tablet (25 mcg total) by mouth daily. 90 tablet 1   Nebulizer System All-In-One MISC 1 Device by Does not apply route as directed. 1 each 0   nicotine  (NICOTINE  STEP 2) 14 mg/24hr patch Place 1 patch (14 mg total) onto the skin daily. 28 patch 2   nitroGLYCERIN  (NITROSTAT ) 0.4 MG SL tablet Place 1 tablet (0.4 mg total) under the tongue every 5 (five) minutes as needed for chest pain. 25 tablet 3   ondansetron  (ZOFRAN -ODT) 8 MG disintegrating tablet Take 1 tablet (8 mg total) by mouth every 8 (eight) hours as needed for nausea or vomiting. TAKE ONE TABLET BY MOUTH EVERY 8 HOURS AS NEEDED FOR NAUSEA AND VOMITING 60 tablet 3   OXYGEN  Inhale 2 L into the lungs continuous.     pantoprazole  (PROTONIX ) 40 MG tablet TAKE ONE TABLET BY MOUTH TWICE DAILY WITH FOOD BREAKFAST AND SUPPER 60 tablet 5   promethazine  (PHENERGAN ) 25 MG tablet Take 1 tablet (25 mg total) by mouth every 8 (eight) hours as needed for nausea or vomiting. TAKE ONE TABLET BY MOUTH EVERY 8 HOURS AS NEEDED FOR NAUSEA AND VOMITING 30 tablet 3   sacubitril -valsartan  (ENTRESTO ) 24-26 MG TAKE ONE TABLET BY MOUTH TWICE DAILY 60 tablet 6   spironolactone  (  ALDACTONE ) 25 MG tablet TAKE ONE-HALF TABLET BY MOUTH ONCE DAILY 45 tablet 3   temazepam  (RESTORIL ) 30 MG capsule  Take 30 mg by mouth at bedtime as needed for sleep.     VENTOLIN  HFA 108 (90 Base) MCG/ACT inhaler INHALE TWO PUFFS INTO THE LUNGS EVERY 6 HOURS AS NEEDED FOR SHORTNESS OF BREATH OR FOR WHEEZING (Patient taking differently: Inhale 2 puffs into the lungs every 6 (six) hours as needed for shortness of breath or wheezing.) 18 g 2   Buprenorphine HCl-Naloxone HCl 8-2 MG FILM Place 1 Film under the tongue 2 (two) times daily.     dicyclomine  (BENTYL ) 20 MG tablet TAKE ONE TABLET BY MOUTH THREE TIMES DAILY BEFORE MEALS (Patient taking differently: Take 20 mg by mouth 3 (three) times daily before meals.) 90 tablet 3   hydrOXYzine  (ATARAX ) 50 MG tablet TAKE ONE TABLET BY MOUTH UP TO THREE TIMES DAILY AS NEEDED FOR ANXIETY (Patient taking differently: Take 50 mg by mouth 3 (three) times daily as needed for anxiety.) 90 tablet 2   metoprolol  succinate (TOPROL -XL) 100 MG 24 hr tablet TAKE ONE TABLET BY MOUTH ONCE DAILY WITH OR IMMEDIATELY FOLLOWING A MEAL (Patient taking differently: Take 100 mg by mouth daily.) 30 tablet 11   OLANZapine (ZYPREXA) 5 MG tablet Take 5 mg by mouth daily. (Patient taking differently: Take 10 mg by mouth daily.)     No current facility-administered medications for this visit.    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   CHF (congestive heart failure) (HCC)    Chronic abdominal pain    Chronic back pain    Chronic kidney disease, stage 1 07/04/2022   Colitis    Constipation    COPD (chronic obstructive pulmonary disease) (HCC) 04/04/2023   Depression 03/14/2023   Dyspnea    with exertion   GERD (gastroesophageal reflux disease)    Gram-negative bacteremia 09/03/2022   History of blood transfusion 08/2022   2 units transfused at [facility]   Hypertension    Insomnia    Kidney cysts    left kidney   MDD (major depressive disorder)    Migraine    Nonischemic  cardiomyopathy (HCC) 06/14/2023   Oxygen  dependent    2L via [place] prn 24/7   Plumbism    blood clot    Pneumonia    x several   PONV (postoperative nausea and vomiting)    PTSD (post-traumatic stress disorder)    Pyelonephritis 09/02/2022   Renal mass    Sepsis due to Escherichia coli (E. 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   Uterine cancer [facility])    age 38    Past Surgical History:  Procedure Laterality Date   ABDOMINAL HYSTERECTOMY     BACK SURGERY     BIOPSY  07/07/2021   Procedure: BIOPSY;  Surgeon: Cindie Carlin POUR, DO;  Location: [facility];  Service: Endoscopy;;   CHOLECYSTECTOMY     Complicated by bile leak requiring ERCP with temporary stenting   COLONOSCOPY WITH ESOPHAGOGASTRODUODENOSCOPY (EGD)  07/2009   [facility]: Op notes cannot be seen through care everywhere however pathology showed terminal ileum and random colon biopsies normal.  Stomach biopsy with gastric antral and fundic mucosa  with reactive foveolar hyperplasia.  No active gastritis.  Stains negative for H. pylori.   COLONOSCOPY WITH PROPOFOL  N/A 07/07/2021   Surgeon: Cindie Dunnings K, DO;  Non-bleeding internal hemorrhoids, normal TI  biopsied, normal colon biopsied. Pathology with colon and TI biopsies normal.   ESOPHAGEAL BRUSHING  07/07/2021   Procedure: ESOPHAGEAL BRUSHING;  Surgeon: Cindie Dunnings POUR, DO;  Location: [facility];  Service: Endoscopy;;   ESOPHAGOGASTRODUODENOSCOPY  06/2007   [facility]: Normal esophagus, gastric mucosal abnormality with erythema, biopsy showed gastric antral and fundic mucosa with reactive foveolar hyperplasia, no gastritis, no H. pylori.   ESOPHAGOGASTRODUODENOSCOPY (EGD) WITH PROPOFOL  N/A 07/07/2021   Surgeon: Cindie Dunnings POUR, DO; moderately severe Candida esophagitis, gastritis, biopsies negative for H. pylori, normal examined duodenum.  Prescribed Diflucan  and  recommended PPI twice daily.   HAND SURGERY     OPEN REDUCTION INTERNAL FIXATION (ORIF) PROXIMAL PHALANX Right 06/19/2023   Procedure: OPEN REDUCTION INTERNAL FIXATION (ORIF) OF RIGHT SMALL FINGER PROXIMAL PHALANX FRACTURE;  Surgeon: Sebastian Lenis, MD;  Location: [facility];  Service: Orthopedics;  Laterality: Right;    Family History  Adopted: Yes  Problem Relation Age of Onset   Colon cancer Maternal Grandfather        45s   Colon cancer Maternal Uncle        76s   Cancer Other    Seizures Other    Stroke Other    Diabetes Other     Allergies as of 03/26/2024 - Review Complete 03/26/2024  Allergen Reaction Noted   Buprenorphine Other (See Comments) 01/24/2016   Penicillins Swelling and Rash 07/08/2011   Aripiprazole Other (See Comments) 08/01/2020   Duloxetine hcl Other (See Comments) 07/04/2022   Seroquel [quetiapine] Other (See Comments) 08/18/2020   Cefaclor Other (See Comments)    Cyclobenzaprine  Other (See Comments) 07/04/2022   Doxycycline Rash 08/01/2020   Erythromycin Rash 07/04/2022   Gabapentin Nausea And Vomiting and Other (See Comments) 01/24/2016   Ibuprofen Swelling and Rash 07/08/2011   Levofloxacin  Itching and Rash 01/24/2016   Lisinopril Nausea And Vomiting 08/01/2020   Macrobid [nitrofurantoin monohyd macro] Rash 11/30/2014   Naproxen Swelling and Rash 07/08/2011   Pregabalin Rash 07/04/2022    Social History   Socioeconomic History   Marital status: Married    Spouse name: benito   Number of children: 2   Years of education: Not on file   Highest education level: Associate degree: occupational, scientist, product/process development, or vocational program  Occupational History    Comment: not employed  Tobacco Use   Smoking status: Every Day    Current packs/day: 1.00    Average packs/day: 1 pack/day for 20.0 years (20.0 ttl pk-yrs)    Types: Cigarettes, E-cigarettes   Smokeless tobacco: Never   Tobacco comments:    1/2 ppd 02/04/24   Vaping Use   Vaping status: Former   Substance and Sexual Activity   Alcohol use: Not Currently    Comment: Quit in 2021.  See psychiatry note from 03/14/2023   Drug use: Not Currently    Types: Benzodiazepines, Hydrocodone     Comment: See psychiatry note from 04/09/23   Sexual activity: Yes    Birth control/protection: Surgical    Comment: Hysterectomy  Other Topics Concern   Not on file  Social History Narrative   Not on file   Social Drivers of Health   Financial Resource Strain: High Risk (12/04/2023)   Overall Financial Resource Strain (CARDIA)    Difficulty of Paying Living Expenses: Very  hard  Food Insecurity: Food Insecurity Present (12/04/2023)   Hunger Vital Sign    Worried About Running Out of Food in the Last Year: Often true    Ran Out of Food in the Last Year: Often true  Transportation Needs: Unmet Transportation Needs (12/04/2023)   PRAPARE - Administrator, Civil Service (Medical): Yes    Lack of Transportation (Non-Medical): No  Physical Activity: Inactive (12/04/2023)   Exercise Vital Sign    Days of Exercise per Week: 0 days    Minutes of Exercise per Session: Not on file  Stress: Stress Concern Present (12/04/2023)   [employer] of Occupational Health - Occupational Stress Questionnaire    Feeling of Stress: Very much  Social Connections: Socially Isolated (12/04/2023)   Social Connection and Isolation Panel    Frequency of Communication with Friends and Family: Never    Frequency of Social Gatherings with Friends and Family: Never    Attends Religious Services: Never    Database Administrator or Organizations: No    Attends Engineer, Structural: Not on file    Marital Status: Separated    Review of Systems   Gen: Denies fever, chills, anorexia. Denies fatigue, weakness, weight loss.  CV: + peripheral edema. Denies chest pain, palpitations, syncope, peripheral edema, and claudication. Resp: + DOE, cough. Denies dyspnea at rest, wheezing, coughing up blood, and  pleurisy. GI: See HPI Derm: Denies rash, itching, dry skin Heme: Denies bruising, bleeding, and enlarged lymph nodes.  Physical Exam   BP 128/68 (BP Location: Right Arm, Patient Position: Sitting, Cuff Size: Normal)   Pulse 76   Temp 98 F (36.7 C) (Temporal)   Ht 5' 1 (1.549 m)   Wt 149 lb 12.8 oz (67.9 kg)   BMI 28.30 kg/m   General:   Alert and oriented. No distress noted. Pleasant and cooperative.  Head:  Normocephalic and atraumatic. Eyes:  Conjuctiva clear without scleral icterus. Mouth:  Oral mucosa pink and moist. Good dentition. No lesions. Lungs:  Clear to auscultation bilaterally. No wheezes, rales, or rhonchi. No distress.  Heart:  S1, S2 present without murmurs appreciated.  Abdomen:  +BS, soft, non-tender and non-distended. No rebound or guarding. No HSM or masses noted. Rectal: deferred Msk:  Symmetrical without gross deformities. Normal posture. Extremities:  Without edema. Neurologic:  Alert and  oriented x4 Psych:  Alert and cooperative. Normal mood and affect.  Assessment & Plan  KIANI WURTZEL is a 53 y.o. female presenting today with need for medication refills.     Chronic abdominal pain with functional diarrhea and nausea/vomiting Chronic abdominal pain primarily in the middle and lower abdomen, managed with dicyclomine . Pain is severe if doses are missed. Chronic diarrhea is managed with dicyclomine . Pain is described as stabbing when pressure is applied, possibly related to scar tissue from previous gallbladder removal and chronic back issues. Nausea/vomiting is managed with alternating Zofran  and Phenergan  as needed. Symptoms well controlled at this time.  - Continue dicyclomine  20 mg three times a day. - Refilled dicyclomine  prescription. - Continue alternating Zofran  and Phenergan  for nausea as needed. - Continue reflux medications as below.  GERD Managed with Protonix  twice daily and Pepcid  as needed, approximately twice a week. Symptoms are  influenced by dietary choices. No breakthrough symptoms reported outside of the need for Pepcid . No chest pain or dysphagia reported. - Continue Protonix  twice daily. - Use Pepcid  as needed, for breakthrough  Chronic anemia with chronic kidney disease Chronic  anemia likely related to chronic kidney disease. Hemoglobin levels are around 10, close to baseline, early October was in the 8 range.  No symptoms of bleeding, falls, or dizziness reported. Plan to monitor hemoglobin and iron levels to assess for iron deficiency. - Will recheck iron levels at next lab work. - Monitor hemoglobin levels. - Discussed need for repeat EGD and colonoscopy for evaluation of her anemia. - will assess in 3 months after labs obtained unless alarm symptoms develop.   Elevated liver enzymes Liver enzymes remain slightly elevated along with alk phos. Plan to monitor liver function and consider autoimmune markers to rule out other causes. History of negative Hep C Ab. GGT within normal limits in January 2025.  - Will recheck liver enzymes at next lab work. - Will check autoimmune markers at next lab work along with fibrosis labs as well.      Follow up   Follow up 6 months.   Charmaine Melia, MSN, FNP-BC, AGACNP-BC [facility]

## 2024-03-26 ENCOUNTER — Ambulatory Visit: Admitting: Internal Medicine

## 2024-03-26 ENCOUNTER — Encounter: Payer: Self-pay | Admitting: Gastroenterology

## 2024-03-26 ENCOUNTER — Ambulatory Visit: Admitting: Gastroenterology

## 2024-03-26 VITALS — BP 128/68 | HR 76 | Temp 98.0°F | Ht 61.0 in | Wt 149.8 lb

## 2024-03-26 DIAGNOSIS — R1013 Epigastric pain: Secondary | ICD-10-CM | POA: Diagnosis not present

## 2024-03-26 DIAGNOSIS — Z8719 Personal history of other diseases of the digestive system: Secondary | ICD-10-CM

## 2024-03-26 DIAGNOSIS — R7989 Other specified abnormal findings of blood chemistry: Secondary | ICD-10-CM

## 2024-03-26 DIAGNOSIS — R112 Nausea with vomiting, unspecified: Secondary | ICD-10-CM | POA: Diagnosis not present

## 2024-03-26 DIAGNOSIS — R1084 Generalized abdominal pain: Secondary | ICD-10-CM | POA: Diagnosis not present

## 2024-03-26 DIAGNOSIS — R197 Diarrhea, unspecified: Secondary | ICD-10-CM | POA: Diagnosis not present

## 2024-03-26 DIAGNOSIS — G8929 Other chronic pain: Secondary | ICD-10-CM

## 2024-03-26 DIAGNOSIS — D509 Iron deficiency anemia, unspecified: Secondary | ICD-10-CM

## 2024-03-26 MED ORDER — DICYCLOMINE HCL 20 MG PO TABS: 20.0000 mg | ORAL_TABLET | Freq: Three times a day (TID) | ORAL | 3 refills | Status: DC

## 2024-03-26 NOTE — Patient Instructions (Addendum)
 Medications refilled today: Dicyclomine  20 mg-3 times daily for abdominal pain  Recently refilled medications: Pantoprazole  40 mg twice daily Zofran  8 mg every 8 hours as needed for nausea Phenergan  25 mg every 8 hours as needed for nausea  Follow a GERD diet:  Avoid fried, fatty, greasy, spicy, citrus foods. Avoid caffeine and carbonated beverages. Avoid chocolate. Try eating 4-6 small meals a day rather than 3 large meals. Do not eat within 3 hours of laying down. Prop head of bed up on wood or bricks to create a 6 inch incline.  Continue taking all your medications as prescribed given you are having good control of symptoms.  We will recheck all of your liver numbers as well as recheck iron levels in 3 months.  Included in this lab work is in extensive list of autoimmune serologies given the chronic elevation of your liver enzymes.  Pending the results of all of this blood work I would recommend us  repeating colonoscopy and upper endoscopy given your anemia and history of colitis.  Follow-up in 6 months, sooner if needed.  It was a pleasure to see you today. I want to create trusting relationships with patients. If you receive a survey regarding your visit,  I greatly appreciate you taking time to fill this out on paper or through your MyChart. I value your feedback.  Charmaine Melia, MSN, FNP-BC, AGACNP-BC [facility]

## 2024-03-31 DIAGNOSIS — G4701 Insomnia due to medical condition: Secondary | ICD-10-CM | POA: Diagnosis not present

## 2024-03-31 DIAGNOSIS — F419 Anxiety disorder, unspecified: Secondary | ICD-10-CM | POA: Diagnosis not present

## 2024-04-06 ENCOUNTER — Other Ambulatory Visit: Payer: Self-pay | Admitting: Gastroenterology

## 2024-04-06 DIAGNOSIS — R112 Nausea with vomiting, unspecified: Secondary | ICD-10-CM

## 2024-04-06 MED ORDER — PROMETHAZINE HCL 25 MG PO TABS: 25.0000 mg | ORAL_TABLET | Freq: Three times a day (TID) | ORAL | 2 refills | Status: DC | PRN

## 2024-04-08 ENCOUNTER — Telehealth: Payer: Self-pay

## 2024-04-08 NOTE — Telephone Encounter (Signed)
 Copied from CRM #[id number]. Topic: Clinical - Medical Advice >> Apr 07, 2024 11:35 AM Devaughn RAMAN wrote: Reason for CRM: Pt called and stated she is wheezing and recently the dosage of her budesonide -formoterol  (SYMBICORT ) 80-4.5 MCG/ACT inhaler got changed and she would like to know if this could be the reason for the wheezing   Please advise I dont see where we have changed anything

## 2024-04-15 ENCOUNTER — Other Ambulatory Visit: Payer: Self-pay

## 2024-04-15 DIAGNOSIS — J449 Chronic obstructive pulmonary disease, unspecified: Secondary | ICD-10-CM

## 2024-04-15 MED ORDER — BUDESONIDE-FORMOTEROL FUMARATE 160-4.5 MCG/ACT IN AERO: 2.0000 | INHALATION_SPRAY | Freq: Two times a day (BID) | RESPIRATORY_TRACT | 6 refills | Status: DC

## 2024-04-20 ENCOUNTER — Ambulatory Visit: Admitting: Internal Medicine

## 2024-04-20 DIAGNOSIS — R0609 Other forms of dyspnea: Secondary | ICD-10-CM

## 2024-04-20 NOTE — Progress Notes (Deleted)
 [name], female    DOB: August 20, 1970    MRN: [id number]  Brief patient profile:  53  yowm active smoker/MM   referred to pulmonary clinic in [place]  02/21/2023 by Almarie Crate  for ? Copd - had CB x around 2018 then admitted April 2024 [facility]   Admit date: 09/02/2022 Discharge date: 09/09/2022   Brief Hospitalization Summary: Please see all hospital notes, images, labs for full details of the hospitalization. ADMISSION HPI:  53 year old female with a history of hypertension, chronic abdominal pain, IBS, colitis, constipation, chronic nausea presenting with worsening generalized abdominal pain Last 24 hours prior to admission.  The patient's spouse at the bedside supplements the history.  The patient is a difficult historian.  Notably, the patient's spouse states that the patient has developed chest congestion, coughing, and some dyspnea on exertion over the past week.  She continues to smoke 1 pack/day.  She has not been on any recent antibiotics.  She has had some generalized weakness.  She has had decreased oral intake.  However, in the last 24 hours prior to admission she developed numerous episodes of nausea and vomiting.  There is no hematemesis.  The patient has not had any diarrhea.  There is no hematochezia or melena.  Spouse also relates that the patient has had some symptoms of urinary urgency in the last day to 2 days.  The patient normally takes BC powders 2 times per week for pain.  There is no hematuria or hemoptysis.  There is no hematemesis. Because of her worsening condition, the patient was brought to emergency department for further evaluation and treatment.   The patient has an extensive GI history for her abdominal pain and IBS and colitis.  Notably, in April 2022 the patient was noted to have colonic wall thickening/colitis on CT.  She was  treated empirically for IBD/suspected Crohn's with prednisone  and Entocort with follow-up colonoscopy in February 2023 (off all  steroids for several months)   normal other than nonbleeding internal hemorrhoids and normal colon biopsies.   More recently, the patient was seen in the emergency department on 05/27/2022 with worsening abdominal pain, persistent vomiting throughout the day. CT A/P showed diffuse colonic wall thickening extending from distal transverse colon throughout the rectosigmoid junction consistent with inflammatory or infectious colitis. She was given a 5-day course of prednisone . She also had CT angiography 06/05/2022 that showed patent mesenteric vasculature. She was prescribed a 5-day course of Cipro .   She was most recently in the GI clinic on 08/23/2022 for her abdominal pain, IBS, and possible colitis.  It was felt that her chronic abdominal pain was multifactorial including IBS, bowel hypersensitivity syndrome, and functional abdominal pain.  She was instructed to continue dicyclomine  and Linzess . Regarding her chronic nausea and vomiting she has had extensive workup withMRI abdomen in 2022 without biliary abnormalities. Gastric emptying study normal in 2023 and EGD February 2023 consistent with Candida esophagitis and gastritis.   In the ED, the patient was afebrile hemodynamically stable with oxygen  saturation 90% on room air.  WBC 16.9, hemoglobin 8.4, platelets 222,000.  Sodium 136, potassium 3.0, bicarbonate 25, serum creatinine 0.92.  AST 47, ALT 18, alk phosphatase 187, total bilirubin 0.6.  CTA abdomen was negative for any contrast extravasation.  Did show focal area of swelling and decreased enhancement in the left kidney with mild fat perinephric stranding.  There was bowel thickening and mucosal enhancement in the ascending and proximal transverse colon.  CTA chest was negative  for any aortic dissection.  There is moderate GGO bilateral with nodular consolidation.  The patient was started on ceftriaxone  and metronidazole .    Assessment/Plan:   Severe Sepsis due to UTI  -presented with fever  (103.5) and leukocytosis -due to bacteremia, pyelonephritis and PNA -lactic peaked at 5.0 -initially on  ceftriaxone , azithro, metronidazole  -follow blood culture--ESBL E coli  -changed to meropenem /ertapenem  for a full 10 day course    E. coli bacteremia -Preliminary multiplex PCR suggest ESBL -Discontinue ceftriaxone  -continue meropenem  -Discontinue metronidazole  -ID consult requested and recommendation is for 10 day course of meropenem /ertapenem     Acute respiratory failure with hypoxia -Presented with tachypnea and hypoxia with saturation 90% on room air -Secondary to pneumonia in the setting of COPD -Stable on 2 L nasal cannula>>weaned to RA -Wean oxygen  as tolerated for saturation greater 92% -09/04/22 evening--pt had respiratory distress -09/04/22 CXR--personally reviewed--bilateral interstitial and nodular infiltrates -weaned down to 2L prior to discharge   Acute pulmonary edema - TREATED  -09/04/22 evening--respiratory distress -start IV lasix  - transitioned to oral lasix  to start 09/09/22  -BNP 2180 -Echo - EF 25-30%  -DC on oral lasix  40 mg daily with potassium supplement -pt has appt to follow up with cardiology on 09/24/22   Echo 10/02/22  wnl  with cards opinion =  Her cardiomyopathy was in the setting of her past acute illness (sepsis), NICM.    History of Present Illness  02/21/2023  Pulmonary/ 1st office eval/ Tama Grosz / [place] Office on spiriva dpi  Chief Complaint  Patient presents with   Consult  Dyspnea:  only goes out to go to doctor - just walking room to room at home  Cough: p supper sometimes worse / min mucoid  Sleep: bed if flat with 3 pillows and difficult to breath is lies flatter SABA use: duoneb helps at hs and saba x 3-4 has spiriva 02: 4 lpm at hs : 4lpm p exert  Lung cancer screen: referred today Rec Plan A = Automatic = Always=   Symbicort  80 (same as Breztri  x 1 puff)  Take 2 puffs first thing in am and then another 2 puffs about 12 hours  later.  Work on inhaler technique:  Plan B = Backup (to supplement plan A, not to replace it) Only use your albuterol  inhaler as a rescue medication  Plan C = Crisis (instead of Plan B but only if Plan B stops working) - only use your albuterol - ipatropium nebulizer if you first try Plan B  Also  Ok to try albuterol  15 min before an activity (on alternating days)  that you know would usually make you short of breath Protonix  40mg   Take 30- 60 min before your first and last meals of the day and Pepcid  20 mg after supper GERD diet reviewed, bed blocks rec    The key is to stop smoking completely before smoking completely stops you!        04/04/2023  f/u ov/[place] office/Olivya Sobol re: GOLD ? Copd / AB  maint on Breztri    Chief Complaint  Patient presents with   COPD  Dyspnea:  short of breath across the house without 02  Cough: after supper worse non productive  Sleeping: bed is flat 3 pillows or feel chokin /suffocating    x  years /also overt hb  SABA use: several x per day never pre treats  02: 2lpm at bedtime / same day time  but only at rest, not with activity  Lung cancer screening:  not due until 08/2023  Rec Take metaprolol 100mg  one half pill twice daily rather than a whole  pill once a day Plan A = Automatic = Always=    Symbicort  80 Take 2 puffs first thing in am and then another 2 puffs about 12 hours later.   Work on inhaler technique:  Plan B = Backup (to supplement plan A, not to replace it) Only use your albuterol  inhaler as a rescue medication Also  Ok to try albuterol  15 min before an activity (on alternating days)  that you know would usually make you short of breath No need for 02 except at bedtime      - PFTs 08/06/23  with insp portion F/V loop c/w truncation but not a true plateau  08/06/23 typical of VCD  LDSCT  09/27/23 Centrilobular and paraseptal emphysema / RADS 1   02/04/2024  f/u ov/[place] office/Aodhan Scheidt re:  doe/ emphysema on LDSCT still  smoking s  obst on pfts 08/06/23  maint on symbiicort 80  / still smoking and using nicotine  patch same time.  Chief Complaint  Patient presents with   Medical Management of Chronic Issues   COPD    Increased SOB x 2 months. She has had CP off and on over the past wk.  She has had swelling in her legs x 2 wks.    Dyspnea: 10- 15 min activities of daily living then has to take a break  Cough: dry  Sleeping: flat bed / 4 pillows s    resp cc  SABA use: 2-3 x per day  02: 2 cont walking and sleeping  Lung cancer screening: RADS 1  09/27/23  New ant R cp with deep breath x sev weeks somewhat positional  Patient Instructions  The key is to stop smoking completely before smoking completely stops you! - you should not smoke at all while wearing the patch. Make sure you check your oxygen  saturation  AT  your highest level of activity (not after you stop)   to be sure it stays over 90%    try albuterol  15 min before an activity (on alternating days)  that you know would usually make you short of breath         Admit date: 02/12/2024 Discharge date: 02/13/2024     PCP: Bevely Doffing, FNP   DISCHARGE DIAGNOSES:  Principal Problem:   Hyponatremia   Gastroesophageal reflux disease   CAP (community acquired pneumonia)   Essential hypertension   COPD (chronic obstructive pulmonary disease) (HCC)   Chronic respiratory failure with hypoxia (HCC)   Acquired hypothyroidism   Heart failure with recovered ejection fraction (HFrecEF) (HCC)     PATIENT LEFT AGAINST MEDICAL ADVISE   INITIAL HISTORY:  53 y.o. female with medical history significant of hypertension, hyperlipidemia, COPD on 2 LPM of oxygen  at home, history of HFrecEF, nonischemic cardiomyopathy, history of PE (not on blood thinners) who presents to the emergency department due to elevated D-dimer, chest pain and shortness of breath. Patient went to pulmonologist on 9/23, blood work was done and patient saw abnormal result in MyChart , but she was  unable to communicate with anyone from the pulmonologist office, she had a follow-up with cardiologist today, positive D-dimer was noted on the recent blood work done and patient was asked to go to the ED for evaluation.  She underwent CT angiogram of the chest.  PE was ruled out but she was found to have patchy opacities.  She was hospitalized for  further management.    HOSPITAL COURSE:    Hyponatremia Was noted to have a sodium level of 114.  Sodium was 139 in February.  No labs noted in Care Everywhere. Patient mentions that due to lower extremity edema the dose of her diuretics were increased recently.  Hyponatremia is most likely acute.  Most likely due to diuretic use. Patient was given normal saline boluses.  Looks like she was given 3 L of normal saline along with 500 mL bolus of LR.  Sodium level increased from 1 14-1 19-1 31 this morning.  Overcorrection of sodium noted.  Patient has been given a dose of desmopressin .  She was been started on D5 infusion.  Will recheck sodium levels later today and tonight and then tomorrow again.   Urine osmolality and urine sodium level is pending.   Community-acquired pneumonia COVID-19 influenza and RSV PCR negative. Patient came in with chest pain.  PE was ruled out.  No concern for pulmonary edema but concern for pneumonia was raised.  Could also be inflammatory in origin.  Since her WBC is normal and procalcitonin is unremarkable we will check inflammatory markers. Procalcitonin level is less than 0.1.  Continue with ceftriaxone  for now.  Doxycycline  was also ordered but she refused due to allergy.  Allergic to macrolides as well. Does not seem to have any oxygen  requirements.  She mentioned that she does use oxygen  mainly at nighttime at home. Follow-up with pulmonology at discharge   Chronic respiratory failure with hypoxia On nocturnal oxygen  at home.   Normocytic anemia Drop in hemoglobin is likely dilutional.  No evidence of overt bleeding.   Continue to monitor.   Acquired hypothyroidism Continue levothyroxine .   Essential hypertension Low blood pressures noted.  Antihypertensives on hold.   Chronic systolic CHF EF was 25 to 30% in 2024.  Within a month of that EF had recovered. Followed by cardiology.  Diuretics on hold due to hyponatremia.  Other cardiac medications on hold due to hypotension.  No active cardiac issues currently.   Abnormal LFTs Elevated alkaline phosphatase, AST and ALT noted.  Old labs reviewed and she has always had some degree of abnormal LFTs previously.  Will check hepatitis panel.  Right upper quadrant ultrasound.  No recent CT scan or ultrasound reports available.     PATIENT LEFT AGAINST MEDICAL ADVISE   03/17/2024  f/u ov/[place] office/Tashe Purdon re: doe / GOLD 0   copd/ emphysema/ anemia   maint on symbicort  160   still  doing some smoking  Chief Complaint  Patient presents with   Shortness of Breath    Had double pna / shob is better   Dyspnea:  mostly housebound x for the doctor visits  Cough: still coughing green mucus not worse in am / sporadic thru the day and takes cough med at hs = delsym / no better with abx  Sleeping: bed flat/ 3-4 pillows  resp cc  SABA use: up to 3 x hfa always p ex/ neb once a day  02: 2lpm 24/7   Patient Instructions  Plan A = Automatic = Always=    Symbicort  80 Take 2 puffs first thing in am and then another 2 puffs about 12 hours later.    Work on inhaler technique:  relax and gently blow all the way out then take a nice smooth full deep breath back in, triggering the inhaler at same time you start breathing in.  Hold breath in for at least  5 seconds if  you can. Blow out symbicort   thru nose. Rinse and gargle with water when done.  If mouth or throat bother you at all,  try brushing teeth/gums/tongue with arm and hammer toothpaste/ make a slurry and gargle and spit out.       Plan B = Backup (to supplement plan A, not to replace it) Use your albuterol   inhaler as a rescue medication to be used if you can't catch your breath by resting or slowing your pace  or doing a relaxed purse lip breathing pattern.  - The less you use it, the better it will work when you need it. - Ok to use the inhaler up to 2 puffs  every 4 hours if you must but call for appointment if use goes up over your usual need - Don't leave home without it !!  (think of it like the spare tire or starter fluid for your car)   Plan C = Crisis (instead of Plan B but only if Plan B stops working) - only use your albuterol  nebulizer if you first try Plan B and it fails to help > ok to use the nebulizer up to every 4 hours but if start needing it regularly call for immediate appointment  Also  Ok to try albuterol  15 min before an activity (on alternating days)  that you know would usually make you short of breath and see if it makes any difference and if makes none then don't take albuterol  after activity unless you can't catch your breath as this means it's the resting that helps, not the albuterol .       Make sure you check your oxygen  saturation  AT  your highest level of activity (not after you stop)   to be sure it stays over 90% and adjust  02 flow upward to maintain this level if needed but remember to turn it back to previous settings when you stop (to conserve your supply).    Change lopressor  to take one half pill twice daily   The key is to stop smoking completely before smoking completely stops you!   Please remember to go to the lab department   for your tests - we will call you with the results when they are available.  Please schedule a follow up office visit in 6 weeks, call sooner if needed with all medications /inhalers/ solutions in hand   04/20/2024  f/u ov/[place] office/Adasia Hoar re: *** maint on ***  No chief complaint on file.   Dyspnea:  *** Cough: *** Sleeping: ***   resp cc  SABA use: *** 02: ***  Lung cancer screening: ***   No obvious day to day  or daytime variability or assoc excess/ purulent sputum or mucus plugs or hemoptysis or cp or chest tightness, subjective wheeze or overt sinus or hb symptoms.    Also denies any obvious fluctuation of symptoms with weather or environmental changes or other aggravating or alleviating factors except as outlined above   No unusual exposure hx or h/o childhood pna/ asthma or knowledge of premature birth.  Current Allergies, Complete Past Medical History, Past Surgical History, Family History, and Social History were reviewed in [employer] record.  ROS  The following are not active complaints unless bolded Hoarseness, sore throat, dysphagia, dental problems, itching, sneezing,  nasal congestion or discharge of excess mucus or purulent secretions, ear ache,   fever, chills, sweats, unintended wt loss or wt gain, classically pleuritic or exertional cp,  orthopnea  pnd or arm/hand swelling  or leg swelling, presyncope, palpitations, abdominal pain, anorexia, nausea, vomiting, diarrhea  or change in bowel habits or change in bladder habits, change in stools or change in urine, dysuria, hematuria,  rash, arthralgias, visual complaints, headache, numbness, weakness or ataxia or problems with walking or coordination,  change in mood or  memory.        No outpatient medications have been marked as taking for the 04/20/24 encounter (Appointment) with Darlean Ozell NOVAK, MD.                 Past Medical History:  Diagnosis Date   Chronic abdominal pain    Chronic back pain    Colitis    Constipation    GERD (gastroesophageal reflux disease)    Gram-negative bacteremia 09/03/2022   Hypertension    Migraine    Plumbism    blood clot    PONV (postoperative nausea and vomiting)    Pyelonephritis 09/02/2022   Renal mass    Sepsis due to Escherichia coli (E. coli) (HCC) 09/03/2022   Sepsis due to undetermined organism (HCC) 09/02/2022   Uterine cancer [facility])    age 48       Objective:    wts  04/20/2024        ***  03/20/2024        148  02/04/2024       153  08/06/2023       152   04/04/2023     137   02/21/23 136 lb 8 oz (61.9 kg)  02/11/23 133 lb 6.4 oz (60.5 kg)  11/30/22 127 lb (57.6 kg)     Vital signs reviewed  04/20/2024  - Note at rest 02 sats  ***% on ***   General appearance:    ***     Assessment

## 2024-04-29 ENCOUNTER — Ambulatory Visit: Admitting: Nurse Practitioner

## 2024-04-29 ENCOUNTER — Ambulatory Visit: Admitting: Internal Medicine

## 2024-04-29 DIAGNOSIS — E063 Autoimmune thyroiditis: Secondary | ICD-10-CM

## 2024-05-05 ENCOUNTER — Ambulatory Visit

## 2024-05-05 ENCOUNTER — Ambulatory Visit: Payer: Self-pay | Admitting: Nurse Practitioner

## 2024-05-07 ENCOUNTER — Other Ambulatory Visit: Payer: Self-pay

## 2024-05-07 DIAGNOSIS — Z720 Tobacco use: Secondary | ICD-10-CM

## 2024-05-11 ENCOUNTER — Other Ambulatory Visit: Payer: Self-pay | Admitting: Gastroenterology

## 2024-05-11 DIAGNOSIS — R112 Nausea with vomiting, unspecified: Secondary | ICD-10-CM

## 2024-05-12 ENCOUNTER — Ambulatory Visit: Admitting: Nurse Practitioner

## 2024-05-28 ENCOUNTER — Ambulatory Visit: Admitting: Internal Medicine

## 2024-05-28 DIAGNOSIS — J449 Chronic obstructive pulmonary disease, unspecified: Secondary | ICD-10-CM

## 2024-05-28 DIAGNOSIS — G4734 Idiopathic sleep related nonobstructive alveolar hypoventilation: Secondary | ICD-10-CM

## 2024-05-28 DIAGNOSIS — R058 Other specified cough: Secondary | ICD-10-CM

## 2024-05-28 DIAGNOSIS — J4489 Other specified chronic obstructive pulmonary disease: Secondary | ICD-10-CM

## 2024-05-28 DIAGNOSIS — R0609 Other forms of dyspnea: Secondary | ICD-10-CM

## 2024-05-28 NOTE — Progress Notes (Unsigned)
 "   [name], female    DOB: 1970-08-18    MRN: [id number]  Brief patient profile:  69  yowm active smoker/MM   referred to pulmonary clinic in [place]  02/21/2023 by [name]  for ? Copd - had CB x around 2018 then admitted April 2024 [facility]   Admit date: 09/02/2022 Discharge date: 09/09/2022   Brief Hospitalization Summary: Please see all hospital notes, images, labs for full details of the hospitalization. ADMISSION HPI:  54 year old female with a history of hypertension, chronic abdominal pain, IBS, colitis, constipation, chronic nausea presenting with worsening generalized abdominal pain Last 24 hours prior to admission.  The patient's spouse at the bedside supplements the history.  The patient is a difficult historian.  Notably, the patient's spouse states that the patient has developed chest congestion, coughing, and some dyspnea on exertion over the past week.  She continues to smoke 1 pack/day.  She has not been on any recent antibiotics.  She has had some generalized weakness.  She has had decreased oral intake.  However, in the last 24 hours prior to admission she developed numerous episodes of nausea and vomiting.  There is no hematemesis.  The patient has not had any diarrhea.  There is no hematochezia or melena.  Spouse also relates that the patient has had some symptoms of urinary urgency in the last day to 2 days.  The patient normally takes BC powders 2 times per week for pain.  There is no hematuria or hemoptysis.  There is no hematemesis. Because of her worsening condition, the patient was brought to emergency department for further evaluation and treatment.   The patient has an extensive GI history for her abdominal pain and IBS and colitis.  Notably, in April 2022 the patient was noted to have colonic wall thickening/colitis on CT.  She was  treated empirically for IBD/suspected Crohn's with prednisone  and Entocort with follow-up colonoscopy in February 2023 (off all  steroids for several months)   normal other than nonbleeding internal hemorrhoids and normal colon biopsies.   More recently, the patient was seen in the emergency department on 05/27/2022 with worsening abdominal pain, persistent vomiting throughout the day. CT A/P showed diffuse colonic wall thickening extending from distal transverse colon throughout the rectosigmoid junction consistent with inflammatory or infectious colitis. She was given a 5-day course of prednisone . She also had CT angiography 06/05/2022 that showed patent mesenteric vasculature. She was prescribed a 5-day course of Cipro .   She was most recently in the GI clinic on 08/23/2022 for her abdominal pain, IBS, and possible colitis.  It was felt that her chronic abdominal pain was multifactorial including IBS, bowel hypersensitivity syndrome, and functional abdominal pain.  She was instructed to continue dicyclomine  and Linzess . Regarding her chronic nausea and vomiting she has had extensive workup withMRI abdomen in 2022 without biliary abnormalities. Gastric emptying study normal in 2023 and EGD February 2023 consistent with Candida esophagitis and gastritis.   In the ED, the patient was afebrile hemodynamically stable with oxygen  saturation 90% on room air.  WBC 16.9, hemoglobin 8.4, platelets 222,000.  Sodium 136, potassium 3.0, bicarbonate 25, serum creatinine 0.92.  AST 47, ALT 18, alk phosphatase 187, total bilirubin 0.6.  CTA abdomen was negative for any contrast extravasation.  Did show focal area of swelling and decreased enhancement in the left kidney with mild fat perinephric stranding.  There was bowel thickening and mucosal enhancement in the ascending and proximal transverse colon.  CTA chest  was negative for any aortic dissection.  There is moderate GGO bilateral with nodular consolidation.  The patient was started on ceftriaxone  and metronidazole .    Assessment/Plan:   Severe Sepsis due to UTI  -presented with fever  (103.5) and leukocytosis -due to bacteremia, pyelonephritis and PNA -lactic peaked at 5.0 -initially on  ceftriaxone , azithro, metronidazole  -follow blood culture--ESBL E coli  -changed to meropenem /ertapenem  for a full 10 day course    E. coli bacteremia -Preliminary multiplex PCR suggest ESBL -Discontinue ceftriaxone  -continue meropenem  -Discontinue metronidazole  -ID consult requested and recommendation is for 10 day course of meropenem /ertapenem     Acute respiratory failure with hypoxia -Presented with tachypnea and hypoxia with saturation 90% on room air -Secondary to pneumonia in the setting of COPD -Stable on 2 L nasal cannula>>weaned to RA -Wean oxygen  as tolerated for saturation greater 92% -09/04/22 evening--pt had respiratory distress -09/04/22 CXR--personally reviewed--bilateral interstitial and nodular infiltrates -weaned down to 2L prior to discharge   Acute pulmonary edema - TREATED  -09/04/22 evening--respiratory distress -start IV lasix  - transitioned to oral lasix  to start 09/09/22  -BNP 2180 -Echo - EF 25-30%  -DC on oral lasix  40 mg daily with potassium supplement -pt has appt to follow up with cardiology on 09/24/22   Echo 10/02/22  wnl  with cards opinion =  Her cardiomyopathy was in the setting of her past acute illness (sepsis), NICM.    History of Present Illness  02/21/2023  Pulmonary/ 1st office eval/ [name] / [place] [facility] on spiriva dpi  Chief Complaint  Patient presents with   Consult  Dyspnea:  only goes out to go to doctor - just walking room to room at home  Cough: p supper sometimes worse / min mucoid  Sleep: bed if flat with 3 pillows and difficult to breath is lies flatter SABA use: duoneb helps at hs and saba x 3-4 has spiriva 02: 4 lpm at hs : 4lpm p exert  Lung cancer screen: referred today Rec Plan A = Automatic = Always=   Symbicort  80 (same as Breztri  x 1 puff)  Take 2 puffs first thing in am and then another 2 puffs about 12 hours  later.  Work on inhaler technique:  Plan B = Backup (to supplement plan A, not to replace it) Only use your albuterol  inhaler as a rescue medication  Plan C = Crisis (instead of Plan B but only if Plan B stops working) - only use your albuterol - ipatropium nebulizer if you first try Plan B  Also  Ok to try albuterol  15 min before an activity (on alternating days)  that you know would usually make you short of breath Protonix  40mg   Take 30- 60 min before your first and last meals of the day and Pepcid  20 mg after supper GERD diet reviewed, bed blocks rec    The key is to stop smoking completely before smoking completely stops you!        04/04/2023  f/u ov/[place] office/Madalynne Gutmann re: GOLD ? Copd / AB  maint on Breztri    Chief Complaint  Patient presents with   COPD  Dyspnea:  short of breath across the house without 02  Cough: after supper worse non productive  Sleeping: bed is flat 3 pillows or feel chokin /suffocating    x  years /also overt hb  SABA use: several x per day never pre treats  02: 2lpm at bedtime / same day time  but only at rest, not with activity  Lung  cancer screening: not due until 08/2023  Rec Take metaprolol 100mg  one half pill twice daily rather than a whole  pill once a day Plan A = Automatic = Always=    Symbicort  80 Take 2 puffs first thing in am and then another 2 puffs about 12 hours later.   Work on inhaler technique:  Plan B = Backup (to supplement plan A, not to replace it) Only use your albuterol  inhaler as a rescue medication Also  Ok to try albuterol  15 min before an activity (on alternating days)  that you know would usually make you short of breath No need for 02 except at bedtime      - PFTs 08/06/23  with insp portion F/V loop c/w truncation but not a true plateau  08/06/23 typical of VCD  LDSCT  09/27/23 Centrilobular and paraseptal emphysema / RADS 1   02/04/2024  f/u ov/[place] office/Jermany Sundell re:  doe/ emphysema on LDSCT still  smoking s  obst on pfts 08/06/23  maint on symbiicort 80  / still smoking and using nicotine  patch same time.  Chief Complaint  Patient presents with   Medical Management of Chronic Issues   COPD    Increased SOB x 2 months. She has had CP off and on over the past wk.  She has had swelling in her legs x 2 wks.    Dyspnea: 10- 15 min activities of daily living then has to take a break  Cough: dry  Sleeping: flat bed / 4 pillows s    resp cc  SABA use: 2-3 x per day  02: 2 cont walking and sleeping  Lung cancer screening: RADS 1  09/27/23  New ant R cp with deep breath x sev weeks somewhat positional  Patient Instructions  The key is to stop smoking completely before smoking completely stops you! - you should not smoke at all while wearing the patch. Make sure you check your oxygen  saturation  AT  your highest level of activity (not after you stop)   to be sure it stays over 90%    try albuterol  15 min before an activity (on alternating days)  that you know would usually make you short of breath         Admit date: 02/12/2024 Discharge date: 02/13/2024     PCP: Bevely Doffing, FNP   DISCHARGE DIAGNOSES:  Principal Problem:   Hyponatremia   Gastroesophageal reflux disease   CAP (community acquired pneumonia)   Essential hypertension   COPD (chronic obstructive pulmonary disease) (HCC)   Chronic respiratory failure with hypoxia (HCC)   Acquired hypothyroidism   Heart failure with recovered ejection fraction (HFrecEF) (HCC)     PATIENT LEFT AGAINST MEDICAL ADVISE   INITIAL HISTORY:  54 y.o. female with medical history significant of hypertension, hyperlipidemia, COPD on 2 LPM of oxygen  at home, history of HFrecEF, nonischemic cardiomyopathy, history of PE (not on blood thinners) who presents to the emergency department due to elevated D-dimer, chest pain and shortness of breath. Patient went to pulmonologist on 9/23, blood work was done and patient saw abnormal result in MyChart , but she was  unable to communicate with anyone from the pulmonologist office, she had a follow-up with cardiologist today, positive D-dimer was noted on the recent blood work done and patient was asked to go to the ED for evaluation.  She underwent CT angiogram of the chest.  PE was ruled out but she was found to have patchy opacities.  She was  hospitalized for further management.    HOSPITAL COURSE:    Hyponatremia Was noted to have a sodium level of 114.  Sodium was 139 in February.  No labs noted in Care Everywhere. Patient mentions that due to lower extremity edema the dose of her diuretics were increased recently.  Hyponatremia is most likely acute.  Most likely due to diuretic use. Patient was given normal saline boluses.  Looks like she was given 3 L of normal saline along with 500 mL bolus of LR.  Sodium level increased from 1 14-1 19-1 31 this morning.  Overcorrection of sodium noted.  Patient has been given a dose of desmopressin .  She was been started on D5 infusion.  Will recheck sodium levels later today and tonight and then tomorrow again.   Urine osmolality and urine sodium level is pending.   Community-acquired pneumonia COVID-19 influenza and RSV PCR negative. Patient came in with chest pain.  PE was ruled out.  No concern for pulmonary edema but concern for pneumonia was raised.  Could also be inflammatory in origin.  Since her WBC is normal and procalcitonin is unremarkable we will check inflammatory markers. Procalcitonin level is less than 0.1.  Continue with ceftriaxone  for now.  Doxycycline  was also ordered but she refused due to allergy.  Allergic to macrolides as well. Does not seem to have any oxygen  requirements.  She mentioned that she does use oxygen  mainly at nighttime at home. Follow-up with pulmonology at discharge   Chronic respiratory failure with hypoxia On nocturnal oxygen  at home.   Normocytic anemia Drop in hemoglobin is likely dilutional.  No evidence of overt bleeding.   Continue to monitor.   Acquired hypothyroidism Continue levothyroxine .   Essential hypertension Low blood pressures noted.  Antihypertensives on hold.   Chronic systolic CHF EF was 25 to 30% in 2024.  Within a month of that EF had recovered. Followed by cardiology.  Diuretics on hold due to hyponatremia.  Other cardiac medications on hold due to hypotension.  No active cardiac issues currently.   Abnormal LFTs Elevated alkaline phosphatase, AST and ALT noted.  Old labs reviewed and she has always had some degree of abnormal LFTs previously.  Will check hepatitis panel.  Right upper quadrant ultrasound.  No recent CT scan or ultrasound reports available.     PATIENT LEFT AGAINST MEDICAL ADVISE   03/17/2024  f/u ov/[place] office/Kelle Ruppert re: doe / GOLD 0   copd/ emphysema/ anemia   maint on symbicort  160   still  doing some smoking  Chief Complaint  Patient presents with   Shortness of Breath    Had double pna / shob is better   Dyspnea:  mostly housebound x for the doctor visits  Cough: still coughing green mucus not worse in am / sporadic thru the day and takes cough med at hs = delsym / no better with abx  Sleeping: bed flat/ 3-4 pillows  resp cc  SABA use: up to 3 x hfa always p ex/ neb once a day  02: 2lpm 24/7  Patient Instructions  Plan A = Automatic = Always=    Symbicort  80 Take 2 puffs first thing in am and then another 2 puffs about 12 hours later.  Work on inhaler technique:   Plan B = Backup (to supplement plan A, not to replace it) Use your albuterol  inhaler as a rescue medication   Plan C = Crisis (instead of Plan B but only if Plan B stops working) - only  use your albuterol  nebulizer if you first try Plan B   Also  Ok to try albuterol  15 min before an activity (on alternating days)  that you know would usually make you short of breath   Make sure you check your oxygen  saturation  AT  your highest level of activity (not after you stop)   to be sure it stays over 90%    Change lopressor  to take one half pill twice daily  The key is to stop smoking completely before smoking completely stops you! Please schedule a follow up office visit in 6 weeks, call sooner if needed with all medications /inhalers/ solutions in hand   05/28/2024  f/u ov/[place] office/Daltin Crist re: AB/chf entresto  maint on ***  did  *** bring meds   *** smoker No chief complaint on file.   Dyspnea:  *** Cough: *** Sleeping: ***   resp cc  SABA use: *** 02: ***  Lung cancer screening: ***   No obvious day to day or daytime variability or assoc excess/ purulent sputum or mucus plugs or hemoptysis or cp or chest tightness, subjective wheeze or overt sinus or hb symptoms.    Also denies any obvious fluctuation of symptoms with weather or environmental changes or other aggravating or alleviating factors except as outlined above   No unusual exposure hx or h/o childhood pna/ asthma or knowledge of premature birth.  Current Allergies, Complete Past Medical History, Past Surgical History, Family History, and Social History were reviewed in [employer] record.  ROS  The following are not active complaints unless bolded Hoarseness, sore throat, dysphagia, dental problems, itching, sneezing,  nasal congestion or discharge of excess mucus or purulent secretions, ear ache,   fever, chills, sweats, unintended wt loss or wt gain, classically pleuritic or exertional cp,  orthopnea pnd or arm/hand swelling  or leg swelling, presyncope, palpitations, abdominal pain, anorexia, nausea, vomiting, diarrhea  or change in bowel habits or change in bladder habits, change in stools or change in urine, dysuria, hematuria,  rash, arthralgias, visual complaints, headache, numbness, weakness or ataxia or problems with walking or coordination,  change in mood or  memory.         Outpatient Medications Prior to Visit  Medication Sig Dispense Refill   atorvastatin  (LIPITOR) 40 MG tablet Take 1  tablet (40 mg total) by mouth daily. 90 tablet 3   budesonide -formoterol  (SYMBICORT ) 160-4.5 MCG/ACT inhaler Inhale 2 puffs into the lungs 2 (two) times daily. 1 each 6   budesonide -formoterol  (SYMBICORT ) 80-4.5 MCG/ACT inhaler Take 2 puffs first thing in am and then another 2 puffs about 12 hours later. 10.2 g 12   Buprenorphine HCl-Naloxone HCl 8-2 MG FILM Place 1 Film under the tongue 2 (two) times daily.     cyclobenzaprine  (FLEXERIL ) 5 MG tablet Take 5 mg by mouth 3 (three) times daily as needed.     dicyclomine  (BENTYL ) 20 MG tablet Take 1 tablet (20 mg total) by mouth 3 (three) times daily before meals. 90 tablet 3   famotidine  (PEPCID ) 20 MG tablet Take 20 mg by mouth daily as needed (For break through heart burn).     fluconazole  (DIFLUCAN ) 150 MG tablet Take 150 mg by mouth once.     FLUoxetine  (PROZAC ) 40 MG capsule Take 2 capsules (80 mg total) by mouth daily. 180 capsule 1   furosemide  (LASIX ) 40 MG tablet TAKE ONE TABLET BY MOUTH ONCE DAILY AS NEEDED FOR SWELLING. 30 tablet 1   hydrOXYzine  (ATARAX )  50 MG tablet TAKE ONE TABLET BY MOUTH UP TO THREE TIMES DAILY AS NEEDED FOR ANXIETY (Patient taking differently: Take 50 mg by mouth 3 (three) times daily as needed for anxiety.) 90 tablet 2   ipratropium-albuterol  (DUONEB) 0.5-2.5 (3) MG/3ML SOLN Take 3 mLs by nebulization every 4 (four) hours as needed (SOB, wheezing, coughing). 360 mL 1   levothyroxine  (SYNTHROID ) 25 MCG tablet Take 1 tablet (25 mcg total) by mouth daily. 90 tablet 1   metoprolol  succinate (TOPROL -XL) 100 MG 24 hr tablet TAKE ONE TABLET BY MOUTH ONCE DAILY WITH OR IMMEDIATELY FOLLOWING A MEAL (Patient taking differently: Take 100 mg by mouth daily.) 30 tablet 11   Nebulizer System All-In-One MISC 1 Device by Does not apply route as directed. 1 each 0   NICOTINE  STEP 2 14 MG/24HR patch APPLY 1 PATCH TO SKIN ONCE DAILY 28 patch 2   nitroGLYCERIN  (NITROSTAT ) 0.4 MG SL tablet Place 1 tablet (0.4 mg total) under the tongue  every 5 (five) minutes as needed for chest pain. 25 tablet 3   OLANZapine  (ZYPREXA ) 5 MG tablet Take 5 mg by mouth daily. (Patient taking differently: Take 10 mg by mouth daily.)     ondansetron  (ZOFRAN -ODT) 8 MG disintegrating tablet DISSOLVE ONE TABLET UNDER THE TONGUE EVERY 8 HOURS AS NEEDED FOR NAUSEA AND VOMITING 60 tablet 3   OXYGEN  Inhale 2 L into the lungs continuous.     pantoprazole  (PROTONIX ) 40 MG tablet TAKE ONE TABLET BY MOUTH TWICE DAILY WITH FOOD BREAKFAST AND SUPPER 60 tablet 5   promethazine  (PHENERGAN ) 25 MG tablet Take 1 tablet (25 mg total) by mouth every 8 (eight) hours as needed for nausea or vomiting. TAKE ONE TABLET BY MOUTH EVERY 8 HOURS AS NEEDED FOR NAUSEA AND VOMITING 90 tablet 2   sacubitril -valsartan  (ENTRESTO ) 24-26 MG TAKE ONE TABLET BY MOUTH TWICE DAILY 180 tablet 3   spironolactone  (ALDACTONE ) 25 MG tablet TAKE ONE-HALF TABLET BY MOUTH ONCE DAILY 45 tablet 3   temazepam  (RESTORIL ) 30 MG capsule Take 30 mg by mouth at bedtime as needed for sleep.     VENTOLIN  HFA 108 (90 Base) MCG/ACT inhaler INHALE TWO PUFFS INTO THE LUNGS EVERY 6 HOURS AS NEEDED FOR SHORTNESS OF BREATH OR FOR WHEEZING (Patient taking differently: Inhale 2 puffs into the lungs every 6 (six) hours as needed for shortness of breath or wheezing.) 18 g 2   No facility-administered medications prior to visit.             Past Medical History:  Diagnosis Date   Chronic abdominal pain    Chronic back pain    Colitis    Constipation    GERD (gastroesophageal reflux disease)    Gram-negative bacteremia 09/03/2022   Hypertension    Migraine    Plumbism    blood clot    PONV (postoperative nausea and vomiting)    Pyelonephritis 09/02/2022   Renal mass    Sepsis due to Escherichia coli (E. coli) (HCC) 09/03/2022   Sepsis due to undetermined organism (HCC) 09/02/2022   Uterine cancer [facility])    age 69      Objective:    wts  05/28/2024        ***  03/20/2024        148  02/04/2024        153  08/06/2023       152   04/04/2023     137   02/21/23 136 lb 8 oz (61.9 kg)  02/11/23 133 lb 6.4 oz (60.5 kg)  11/30/22 127 lb (57.6 kg)    Vital signs reviewed  05/28/2024  - Note at rest 02 sats  ***% on ***   General appearance:    ***         Chemistry       Lab Results  Component Value Date   HGB 10.1 (L) 03/17/2024   HGB 8.2 (L) 02/18/2024   HGB 8.4 (L) 02/13/2024   HGB 10.4 (L) 02/12/2024   HGB 9.9 (L) 02/04/2024     Lab Results  Component Value Date   ESRSEDRATE 91 (H) 03/17/2024          Assessment                               "

## 2024-05-31 ENCOUNTER — Other Ambulatory Visit: Payer: Self-pay | Admitting: Nurse Practitioner

## 2024-05-31 ENCOUNTER — Other Ambulatory Visit: Payer: Self-pay | Admitting: Internal Medicine

## 2024-06-01 ENCOUNTER — Ambulatory Visit

## 2024-06-02 ENCOUNTER — Other Ambulatory Visit: Payer: Self-pay | Admitting: *Deleted

## 2024-06-02 DIAGNOSIS — R7989 Other specified abnormal findings of blood chemistry: Secondary | ICD-10-CM

## 2024-06-02 DIAGNOSIS — D509 Iron deficiency anemia, unspecified: Secondary | ICD-10-CM

## 2024-06-14 DEATH — deceased

## 2024-06-19 ENCOUNTER — Other Ambulatory Visit: Payer: Self-pay | Admitting: Gastroenterology

## 2024-06-19 ENCOUNTER — Ambulatory Visit

## 2024-06-19 DIAGNOSIS — R112 Nausea with vomiting, unspecified: Secondary | ICD-10-CM

## 2024-06-23 ENCOUNTER — Ambulatory Visit

## 2024-08-12 ENCOUNTER — Ambulatory Visit: Admitting: Nurse Practitioner

## 2024-08-13 ENCOUNTER — Ambulatory Visit: Admitting: Nurse Practitioner
